# Patient Record
Sex: Female | Born: 1935 | Race: White | Hispanic: No | Smoking: Former smoker
Health system: Southern US, Community
[De-identification: ages and names within clinical notes are randomized; demographics above are authoritative.]

## PROBLEM LIST (undated history)

## (undated) DIAGNOSIS — E782 Mixed hyperlipidemia: Secondary | ICD-10-CM

## (undated) DIAGNOSIS — K429 Umbilical hernia without obstruction or gangrene: Secondary | ICD-10-CM

## (undated) DIAGNOSIS — I959 Hypotension, unspecified: Secondary | ICD-10-CM

## (undated) DIAGNOSIS — F0280 Dementia in other diseases classified elsewhere without behavioral disturbance: Secondary | ICD-10-CM

## (undated) DIAGNOSIS — Z7901 Long term (current) use of anticoagulants: Secondary | ICD-10-CM

## (undated) DIAGNOSIS — I2510 Atherosclerotic heart disease of native coronary artery without angina pectoris: Secondary | ICD-10-CM

## (undated) DIAGNOSIS — I5032 Chronic diastolic (congestive) heart failure: Secondary | ICD-10-CM

## (undated) DIAGNOSIS — G309 Alzheimer's disease, unspecified: Secondary | ICD-10-CM

## (undated) DIAGNOSIS — R569 Unspecified convulsions: Secondary | ICD-10-CM

## (undated) DIAGNOSIS — R58 Hemorrhage, not elsewhere classified: Secondary | ICD-10-CM

## (undated) DIAGNOSIS — F0390 Unspecified dementia without behavioral disturbance: Secondary | ICD-10-CM

## (undated) DIAGNOSIS — J449 Chronic obstructive pulmonary disease, unspecified: Secondary | ICD-10-CM

## (undated) DIAGNOSIS — I4891 Unspecified atrial fibrillation: Secondary | ICD-10-CM

## (undated) DIAGNOSIS — S32599A Other specified fracture of unspecified pubis, initial encounter for closed fracture: Secondary | ICD-10-CM

## (undated) DIAGNOSIS — M1990 Unspecified osteoarthritis, unspecified site: Secondary | ICD-10-CM

## (undated) DIAGNOSIS — Z789 Other specified health status: Secondary | ICD-10-CM

## (undated) HISTORY — DX: Unspecified dementia, unspecified severity, without behavioral disturbance, psychotic disturbance, mood disturbance, and anxiety: F03.90

## (undated) HISTORY — DX: Mixed hyperlipidemia: E78.2

## (undated) HISTORY — DX: Hypotension, unspecified: I95.9

## (undated) HISTORY — DX: Other specified fracture of unspecified pubis, initial encounter for closed fracture: S32.599A

## (undated) HISTORY — DX: Hemorrhage, not elsewhere classified: R58

## (undated) HISTORY — PX: OTHER SURGICAL HISTORY: SHX169

## (undated) HISTORY — PX: TONSILLECTOMY: SHX5217

## (undated) HISTORY — DX: Other specified health status: Z78.9

## (undated) HISTORY — PX: CATARACT EXTRACTION: SUR2

## (undated) HISTORY — DX: Chronic diastolic (congestive) heart failure: I50.32

## (undated) HISTORY — DX: Unspecified osteoarthritis, unspecified site: M19.90

## (undated) HISTORY — DX: Long term (current) use of anticoagulants: Z79.01

## (undated) HISTORY — DX: Chronic obstructive pulmonary disease, unspecified: J44.9

---

## 2000-11-03 ENCOUNTER — Ambulatory Visit (HOSPITAL_COMMUNITY): Admission: RE | Admit: 2000-11-03 | Discharge: 2000-11-03 | Payer: Self-pay | Admitting: Orthopaedic Surgery

## 2000-11-03 ENCOUNTER — Encounter: Payer: Self-pay | Admitting: Orthopaedic Surgery

## 2002-01-15 ENCOUNTER — Ambulatory Visit (HOSPITAL_COMMUNITY): Admission: RE | Admit: 2002-01-15 | Discharge: 2002-01-15 | Payer: Self-pay | Admitting: Ophthalmology

## 2002-04-15 ENCOUNTER — Inpatient Hospital Stay (HOSPITAL_COMMUNITY): Admission: EM | Admit: 2002-04-15 | Discharge: 2002-04-19 | Payer: Self-pay | Admitting: *Deleted

## 2002-04-15 ENCOUNTER — Encounter: Payer: Self-pay | Admitting: *Deleted

## 2003-04-01 ENCOUNTER — Ambulatory Visit (HOSPITAL_COMMUNITY): Admission: RE | Admit: 2003-04-01 | Discharge: 2003-04-01 | Payer: Self-pay | Admitting: Orthopaedic Surgery

## 2003-10-06 ENCOUNTER — Inpatient Hospital Stay (HOSPITAL_COMMUNITY): Admission: AD | Admit: 2003-10-06 | Discharge: 2003-10-08 | Payer: Self-pay | Admitting: Internal Medicine

## 2004-03-30 ENCOUNTER — Ambulatory Visit: Payer: Self-pay | Admitting: Cardiology

## 2004-11-24 ENCOUNTER — Ambulatory Visit: Payer: Self-pay | Admitting: Cardiology

## 2005-12-26 ENCOUNTER — Ambulatory Visit: Payer: Self-pay | Admitting: Cardiology

## 2006-06-12 ENCOUNTER — Ambulatory Visit: Payer: Self-pay | Admitting: Orthopedic Surgery

## 2006-08-01 ENCOUNTER — Ambulatory Visit: Payer: Self-pay | Admitting: Cardiology

## 2007-03-20 ENCOUNTER — Ambulatory Visit: Payer: Self-pay | Admitting: Cardiology

## 2007-03-27 ENCOUNTER — Encounter: Payer: Self-pay | Admitting: Physician Assistant

## 2007-03-27 ENCOUNTER — Ambulatory Visit: Payer: Self-pay | Admitting: Cardiology

## 2007-03-29 ENCOUNTER — Ambulatory Visit: Payer: Self-pay | Admitting: Cardiology

## 2007-09-03 ENCOUNTER — Ambulatory Visit: Payer: Self-pay | Admitting: Cardiology

## 2007-09-13 ENCOUNTER — Ambulatory Visit: Payer: Self-pay | Admitting: Cardiology

## 2007-09-28 ENCOUNTER — Ambulatory Visit: Payer: Self-pay | Admitting: Cardiology

## 2008-08-04 ENCOUNTER — Ambulatory Visit: Payer: Self-pay | Admitting: Cardiology

## 2008-12-01 ENCOUNTER — Encounter: Payer: Self-pay | Admitting: Cardiology

## 2009-02-10 DIAGNOSIS — I5033 Acute on chronic diastolic (congestive) heart failure: Secondary | ICD-10-CM | POA: Insufficient documentation

## 2009-02-10 DIAGNOSIS — I5032 Chronic diastolic (congestive) heart failure: Secondary | ICD-10-CM | POA: Insufficient documentation

## 2009-02-10 DIAGNOSIS — I2510 Atherosclerotic heart disease of native coronary artery without angina pectoris: Secondary | ICD-10-CM | POA: Insufficient documentation

## 2009-02-10 DIAGNOSIS — I4821 Permanent atrial fibrillation: Secondary | ICD-10-CM

## 2009-04-14 ENCOUNTER — Ambulatory Visit: Payer: Self-pay | Admitting: Cardiology

## 2009-04-15 ENCOUNTER — Telehealth: Payer: Self-pay | Admitting: Cardiology

## 2009-04-20 ENCOUNTER — Telehealth (INDEPENDENT_AMBULATORY_CARE_PROVIDER_SITE_OTHER): Payer: Self-pay | Admitting: *Deleted

## 2009-04-23 ENCOUNTER — Ambulatory Visit: Payer: Self-pay | Admitting: Cardiology

## 2009-04-23 ENCOUNTER — Encounter: Payer: Self-pay | Admitting: Physician Assistant

## 2009-04-24 ENCOUNTER — Ambulatory Visit: Payer: Self-pay | Admitting: Cardiology

## 2009-04-24 ENCOUNTER — Inpatient Hospital Stay (HOSPITAL_COMMUNITY): Admission: AD | Admit: 2009-04-24 | Discharge: 2009-04-28 | Payer: Self-pay | Admitting: Cardiology

## 2009-04-25 ENCOUNTER — Encounter: Payer: Self-pay | Admitting: Cardiology

## 2009-04-27 ENCOUNTER — Encounter: Payer: Self-pay | Admitting: Cardiology

## 2009-04-28 ENCOUNTER — Encounter: Payer: Self-pay | Admitting: Cardiology

## 2009-04-30 ENCOUNTER — Telehealth (INDEPENDENT_AMBULATORY_CARE_PROVIDER_SITE_OTHER): Payer: Self-pay | Admitting: *Deleted

## 2009-05-14 ENCOUNTER — Ambulatory Visit: Payer: Self-pay | Admitting: Physician Assistant

## 2009-05-14 DIAGNOSIS — E782 Mixed hyperlipidemia: Secondary | ICD-10-CM

## 2009-05-14 DIAGNOSIS — I2510 Atherosclerotic heart disease of native coronary artery without angina pectoris: Secondary | ICD-10-CM | POA: Insufficient documentation

## 2009-05-25 ENCOUNTER — Encounter (HOSPITAL_COMMUNITY): Admission: RE | Admit: 2009-05-25 | Discharge: 2009-06-24 | Payer: Self-pay | Admitting: Cardiology

## 2009-06-16 ENCOUNTER — Encounter: Payer: Self-pay | Admitting: Cardiology

## 2009-06-19 ENCOUNTER — Inpatient Hospital Stay (HOSPITAL_COMMUNITY): Admission: EM | Admit: 2009-06-19 | Discharge: 2009-06-22 | Payer: Self-pay | Admitting: Emergency Medicine

## 2009-06-19 ENCOUNTER — Telehealth (INDEPENDENT_AMBULATORY_CARE_PROVIDER_SITE_OTHER): Payer: Self-pay | Admitting: *Deleted

## 2009-06-19 ENCOUNTER — Ambulatory Visit: Payer: Self-pay | Admitting: Cardiology

## 2009-06-19 ENCOUNTER — Encounter (INDEPENDENT_AMBULATORY_CARE_PROVIDER_SITE_OTHER): Payer: Self-pay | Admitting: Physician Assistant

## 2009-06-20 ENCOUNTER — Encounter: Payer: Self-pay | Admitting: Cardiology

## 2009-06-22 ENCOUNTER — Encounter: Payer: Self-pay | Admitting: Cardiology

## 2009-06-25 ENCOUNTER — Encounter (HOSPITAL_COMMUNITY): Admission: RE | Admit: 2009-06-25 | Discharge: 2009-07-25 | Payer: Self-pay | Admitting: Cardiology

## 2009-07-08 ENCOUNTER — Telehealth (INDEPENDENT_AMBULATORY_CARE_PROVIDER_SITE_OTHER): Payer: Self-pay | Admitting: *Deleted

## 2009-07-20 ENCOUNTER — Encounter: Payer: Self-pay | Admitting: Cardiology

## 2009-07-27 ENCOUNTER — Encounter (HOSPITAL_COMMUNITY): Admission: RE | Admit: 2009-07-27 | Discharge: 2009-08-26 | Payer: Self-pay | Admitting: Cardiology

## 2009-07-30 ENCOUNTER — Ambulatory Visit: Payer: Self-pay | Admitting: Cardiology

## 2009-08-02 ENCOUNTER — Emergency Department (HOSPITAL_COMMUNITY)
Admission: EM | Admit: 2009-08-02 | Discharge: 2009-08-02 | Payer: Self-pay | Source: Home / Self Care | Admitting: Emergency Medicine

## 2009-08-04 ENCOUNTER — Encounter: Payer: Self-pay | Admitting: Cardiology

## 2009-08-12 ENCOUNTER — Encounter (INDEPENDENT_AMBULATORY_CARE_PROVIDER_SITE_OTHER): Payer: Self-pay | Admitting: *Deleted

## 2009-08-24 ENCOUNTER — Encounter: Payer: Self-pay | Admitting: Cardiology

## 2009-08-26 ENCOUNTER — Encounter (HOSPITAL_COMMUNITY): Admission: RE | Admit: 2009-08-26 | Discharge: 2009-09-25 | Payer: Self-pay | Admitting: Cardiology

## 2009-09-21 ENCOUNTER — Encounter: Payer: Self-pay | Admitting: Cardiology

## 2009-10-06 ENCOUNTER — Encounter (INDEPENDENT_AMBULATORY_CARE_PROVIDER_SITE_OTHER): Payer: Self-pay | Admitting: *Deleted

## 2009-12-28 ENCOUNTER — Encounter: Payer: Self-pay | Admitting: Cardiology

## 2009-12-30 ENCOUNTER — Telehealth (INDEPENDENT_AMBULATORY_CARE_PROVIDER_SITE_OTHER): Payer: Self-pay | Admitting: *Deleted

## 2010-01-18 ENCOUNTER — Encounter: Payer: Self-pay | Admitting: Physician Assistant

## 2010-01-18 ENCOUNTER — Encounter: Payer: Self-pay | Admitting: Cardiology

## 2010-02-04 ENCOUNTER — Encounter: Payer: Self-pay | Admitting: Cardiology

## 2010-02-09 ENCOUNTER — Encounter: Payer: Self-pay | Admitting: Cardiology

## 2010-02-16 ENCOUNTER — Telehealth (INDEPENDENT_AMBULATORY_CARE_PROVIDER_SITE_OTHER): Payer: Self-pay | Admitting: *Deleted

## 2010-02-22 ENCOUNTER — Ambulatory Visit: Payer: Self-pay | Admitting: Cardiology

## 2010-03-22 ENCOUNTER — Encounter (INDEPENDENT_AMBULATORY_CARE_PROVIDER_SITE_OTHER): Payer: Self-pay

## 2010-06-24 NOTE — Miscellaneous (Signed)
Summary: Rehab Report/ CARDIAC REHAB REPORT [place]  Rehab Report/ CARDIAC REHAB REPORT [place]   Imported By: Dorise Hiss 09/14/2009 09:43:06  _____________________________________________________________________  External Attachment:    Type:   Image     Comment:   External Document

## 2010-06-24 NOTE — Progress Notes (Signed)
Summary: chest pain      Phone Note Call from Patient   Summary of Call: Pt. had stents placed around 1st week of December.  Had post hosp. visit on 12/29 with Gene - doing okay during that time.  New symptoms started 2 weeks ago.  Feeling very tired, difficulty going up steps.  Reaching exercises hard to do.  SOB which is worse with exertion.  Seems to be feeling stumbly on her feet, esp. after exercise.  No dizziness.  Did wake up in middle of night with pain/pressure/tightness in chest.  States she used inhaler and 2nd pillow and symptoms went away gradually.  Did wake up again this morning (around 6) with same type of pain/pressure and took NTG x1 which relieved pain immediately.  Now just feels weak in the knees, but no pain or SOB.  Can definitely tell that something is different.    07-01-2009 [facility] RETD YOUR TELEPHONE CALL 9:50AM V.SLAUGHTER Initial call taken by: Hoover Brunette, LPN,  June 19, 2009 9:14 AM  Follow-up for Phone Call        Per Dr. Andee Lineman, start Imdur 30mg  daily.  Can increase to 60mg  tomorrow.  If have pain over the weekend, need to go to ER.  If no pain, then need to be seen Monday or Tuesday for follow up in office.   Hoover Brunette, LPN  June 19, 2009 3:29 PM   Left message to return call.  Follow-up by: Hoover Brunette, LPN,  June 19, 2009 3:29 PM  Additional Follow-up for Phone Call Additional follow up Details #1::        Tried to reach pt. again.  Left above info on answering machine.  Wil await call from pt. on Monday a.m.  Hoover Brunette, LPN  June 19, 2009 4:54 PM     Additional Follow-up for Phone Call Additional follow up Details #2::    Follow up call to pt.  Did not start Imdur because she went to ER at [facility] that same day.  States her daughter is a Engineer, civil (consulting) also and just didn't feel like she should wait.  Hospital course dictation states pt. was ruled out for MI.  Did have Lexiscan Myoview which showed no evidence of ischemia with EF of 69%.  No  recurrent chest pain.  Did advise her to keep regular f/u for you which is planned for March and routine f/u with Dr. Sherril Croon.   She also said that they said  something about  a virus going around that could affect your heart.  Advised her that  she may not need to start her Imdur, but would confirm with you.  Do you want any earlier f/u than March? Follow-up by: Hoover Brunette, LPN,  June 25, 2009 3:23 PM  Additional Follow-up for Phone Call Additional follow up Details #3:: Details for Additional Follow-up Action Taken: would still start low-dose Imdur and can work in patient earlier in March. Lewayne Bunting, MD, [facility]  June 26, 2009 5:46 AM  Patient notified.   Will start with 30mg  Imdur daily till seen on 3/10.  Pt. will call if any problems before then.    Additional Follow-up by: Hoover Brunette, LPN,  July 01, 2009 2:15 PM  New/Updated Medications: IMDUR 60 MG XR24H-TAB (ISOSORBIDE MONONITRATE) take 1/2 tab (30mg ) today, then 1 tab daily Prescriptions: IMDUR 60 MG XR24H-TAB (ISOSORBIDE MONONITRATE) take 1/2 tab (30mg ) today, then 1 tab daily  #30 x 6   Entered  by:   Hoover Brunette, LPN   Authorized by:   Lewayne Bunting, MD, [facility]   Signed by:   Hoover Brunette, LPN on 16/02/9603   Method used:   Electronically to        [facility]* (retail)       [address] [place], [place]  [address]       Ph: [phone]       Fax: [phone]   RxID:   [id number]

## 2010-06-24 NOTE — Assessment & Plan Note (Signed)
Summary: 6 mo fu per sept reminder-srs   Visit Type:  Follow-up Primary Provider:  Doreen Beam, MD   History of Present Illness: the patient is a 77 year old female with a history of permanent atrial fibrillation, nonobstructive coronary artery disease status post stent placement with a bare-metal stent to the circumflex. She has recently been switched her Coumadin to dabigatran.she called recently because of an episode of substernal chest pain. She experienced chest pressure which required 3 nitroglycerin for relief. Subsequently isosorbide mononitrate was called in but this was never filled. Interestingly however the patient reports no exertional chest pain or shortness of breath. Since this particular episode check has done quite well. Lipid testing was done in March and revealed an LDL of 47 mg percent. Her EKG today demonstrates a fibrillation with nonspecific ST segment changes. The patient denies any palpitations orthopnea or PND.  Preventive Screening-Counseling & Management  Alcohol-Tobacco     Smoking Status: quit     Year Quit: 1990  Current Medications (verified): 1)  Digoxin 0.125 Mg Tabs (Digoxin) .... Take 1 Tablet By Mouth Once A Day 2)  Diltiazem Hcl Coated Beads 120 Mg Xr24h-Cap (Diltiazem Hcl Coated Beads) .... Take 1 Capsule By Mouth Once A Day 3)  Ergocalciferol 50000 Unit Caps (Ergocalciferol) .... Weekly 4)  Caltrate 600+d 600-400 Mg-Unit Tabs (Calcium Carbonate-Vitamin D) .... Take 1 Tablet By Mouth Two Times A Day 5)  Eye-Vites  Tabs (Multiple Vitamins-Minerals) .... Take 1 Tablet By Mouth Once A Day 6)  Furosemide 40 Mg Tabs (Furosemide) .... Take 1/2 Tablet By Mouth Once A Day 7)  Keppra 250 Mg Tabs (Levetiracetam) .... Take 1/2 Tablet By Mouth At 3pm and One At Bedtime 8)  Hydrocodone-Acetaminophen 5-500 Mg Tabs (Hydrocodone-Acetaminophen) .... As Needed Pain 9)  Potassium Chloride Cr 10 Meq Cr-Caps (Potassium Chloride) .... Take One Tablet By Mouth Daily 10)   Aspir-Low 81 Mg Tbec (Aspirin) .... Take 1 Tablet By Mouth Once A Day 11)  Pradaxa 150 Mg Caps (Dabigatran Etexilate Mesylate) .... Take 1 Tablet By Mouth Two Times A Day 12)  Pravastatin Sodium 40 Mg Tabs (Pravastatin Sodium) .... Take 1 Tab By Mouth At Bedtime 13)  Ventolin Hfa 108 (90 Base) Mcg/act Aers (Albuterol Sulfate) .... Two Puffs Four Times A Day As Needed 14)  Imdur 30 Mg Xr24h-Tab (Isosorbide Mononitrate) .... Take 1 Tablet By Mouth Once A Day  Allergies: 1)  Tegretol  Comments:  Nurse/Medical Assistant: The patient's medication bottles and allergies were reviewed with the patient and were updated in the Medication and Allergy Lists.  Past History:  Past Medical History: LEFT VENTRICULAR FUNCTION, DECREASED (ICD-429.2)ejection fraction normalized by Lexiscan 2010:69% DIASTOLIC HEART FAILURE, CHRONIC (ICD-428.32) ATRIAL FIBRILLATION (ICD-427.31) COPD 1. Permanent atrial fibrillation, rate controlled. 2. Status post failed radiofrequency catheter ablation. 3. History of intolerance to FLECAINIDE and AMIODARONE. 4. Chronic diastolic heart failure, compensated. 5. Mild left ventricular systolic dysfunction. 6. Mild chronic obstructive pulmonary disease. 7. Coumadin secondary to atrial fibrillation. /changed to dabigatran  Review of Systems       The patient complains of chest pain.  The patient denies fatigue, malaise, fever, weight gain/loss, vision loss, decreased hearing, hoarseness, palpitations, shortness of breath, prolonged cough, wheezing, sleep apnea, coughing up blood, abdominal pain, blood in stool, nausea, vomiting, diarrhea, heartburn, incontinence, blood in urine, muscle weakness, joint pain, leg swelling, rash, skin lesions, headache, fainting, dizziness, depression, anxiety, enlarged lymph nodes, easy bruising or bleeding, and environmental allergies.    Vital Signs:  Patient profile:  77 year old female Height:      63 inches Weight:      144  pounds BMI:     25.60 Pulse rate:   108 / minute BP sitting:   109 / 73  (left arm) Cuff size:   regular  Vitals Entered By: Carlye Grippe (February 22, 2010 1:31 PM)  Nutrition Counseling: Patient's BMI is greater than 25 and therefore counseled on weight management options.  Physical Exam  Additional Exam:  GENERAL: 77 year old female, sitting upright, in no distress. HEENT: [place], AT. PERRLA, EOMI. NECK: Palpable carotid pulses, no bruits; no JVD, thyromegaly. LUNGS: CTA bilaterally. CARDIAC: irregularly irregular. No significant murmurs. No rubs, gallops. ABDOMEN: Soft, nontender. Intact BS. EXTREMETIES: Palpable femoral pulses, no bruits, no swelling or ecchymosis; minimally palpable posterior tibialis pulses, with chronic bilateral 2+ edema. MUSCULOSKELETAL: No obvious deformity. SKIN: warm, dry. no obvious rashes. NEURO: Alert and oriented. No focal deficit.    EKG  Procedure date:  02/22/2010  Findings:      atrial fibrillation. Nonspecific ST-T wave changes. Heart rate 82 beats per minute  Impression & Recommendations:  Problem # 1:  CAD (ICD-414.00) status post bare-metal stenting to the circumflex coronary artery. Recurrent substernal chest pain that resolved with sublingual nitroglycerin. Atypical features because of lack of exercise induced chest pain. Patient never received a sore but mononitrate which we will fill. If she has recurrent pain repeat cardiac catheterization may be indicated. Her updated medication list for this problem includes:    Diltiazem Hcl Coated Beads 120 Mg Xr24h-cap (Diltiazem hcl coated beads) .[place] Kitchen... Take 1 capsule by mouth once a day    Aspir-low 81 Mg Tbec (Aspirin) .Marland Kitchen... Take 1 tablet by mouth once a day    Imdur 30 Mg Xr24h-tab (Isosorbide mononitrate) .Marland Kitchen... Take 1 tablet by mouth once a day  Problem # 2:  LEFT VENTRICULAR FUNCTION, DECREASED (ICD-429.2) most recent ejection fraction 69% by Lexiscan with resolution of LV  dysfunction.  Problem # 3:  ATRIAL FIBRILLATION (ICD-427.31) rate controlled. Continue dabigatran Her updated medication list for this problem includes:    Digoxin 0.125 Mg Tabs (Digoxin) .Marland Kitchen... Take 1 tablet by mouth once a day    Aspir-low 81 Mg Tbec (Aspirin) .Marland Kitchen... Take 1 tablet by mouth once a day  Orders: EKG w/ Interpretation (93000)  Problem # 4:  DYSLIPIDEMIA (ICD-272.4) continue current statin dose. Her updated medication list for this problem includes:    Pravastatin Sodium 40 Mg Tabs (Pravastatin sodium) .Marland Kitchen... Take 1 tab by mouth at bedtime  Patient Instructions: 1)  If patient calls back with severe chest pain, will need to schedule Lexiscan stress test.   2)  Follow up in  6 months

## 2010-06-24 NOTE — Medication Information (Signed)
Summary: RX Folder/ [facility]  RX Folder/ [facility]   Imported By: Dorise Hiss 02/04/2010 14:38:48  _____________________________________________________________________  External Attachment:    Type:   Image     Comment:   External Document  Appended Document: RX Folder/ [facility] should only be on diltiazem.

## 2010-06-24 NOTE — Assessment & Plan Note (Signed)
Summary: 3 MO FU ALSO STATED WAS IN [place]   Visit Type:  Follow-up Primary Provider:  Doreen Beam, MD  CC:  follow-up visit.  History of Present Illness: the patient is an 77 year old female with a history of permanent atrial fibrillationA. nonobstructive coronary artery disease. Last year the patient was transferred to [facility] for an episode of unstable angina. She was found to have significant circumflex lesion and was treated with bare-metal stenting by Dr. Riley Kill. The patient is currently on Coumadin. She remains in permanent atrial fibrillation but is asymptomatic. She states that after amlodipine was started she has had no recurrent chest pain has significantly less shortness of breath. Her energy level has much improved. She denies any palpitations. She reports that Imdur cost severe headaches and was discontinued. Her previous reported neck pain also has improved.   Preventive Screening-Counseling & Management  Alcohol-Tobacco     Smoking Status: quit     Year Quit: 1990  Current Medications (verified): 1)  Digoxin 0.125 Mg Tabs (Digoxin) .... Take 1 Tablet By Mouth Once A Day 2)  Diltiazem Hcl Coated Beads 120 Mg Xr24h-Cap (Diltiazem Hcl Coated Beads) .... Take 1 Capsule By Mouth Once A Day 3)  Ergocalciferol 50000 Unit Caps (Ergocalciferol) .... Weekly 4)  Caltrate 600+d 600-400 Mg-Unit Tabs (Calcium Carbonate-Vitamin D) .... Take 1 Tablet By Mouth Two Times A Day 5)  Eye-Vites  Tabs (Multiple Vitamins-Minerals) .... Take 1 Tablet By Mouth Once A Day 6)  Furosemide 40 Mg Tabs (Furosemide) .... Take 1/2 Tablet By Mouth Once A Day 7)  Keppra 250 Mg Tabs (Levetiracetam) .... Take 1/2 Tablet By Mouth At 3pm and One At Bedtime 8)  Hydrocodone-Acetaminophen 5-500 Mg Tabs (Hydrocodone-Acetaminophen) .... As Needed Pain 9)  Potassium Chloride Cr 10 Meq Cr-Caps (Potassium Chloride) .... Take One Tablet By Mouth Daily 10)  Aspir-Low 81 Mg Tbec (Aspirin) .... Take 1 Tablet By  Mouth Once A Day 11)  Coumadin 5 Mg Tabs (Warfarin Sodium) .... Use As Directed 12)  Simvastatin 40 Mg Tabs (Simvastatin) .... Take 1 Tablet By Mouth Once A Day 13)  Amlodipine Besylate 5 Mg Tabs (Amlodipine Besylate) .... Take 1 Tablet By Mouth Once A Day  (Please Deliver) 14)  Ventolin Hfa 108 (90 Base) Mcg/act Aers (Albuterol Sulfate) .... Two Puffs Four Times A Day As Needed  Allergies: 1)  Tegretol  Comments:  Nurse/Medical Assistant: The patient's medications and allergies were reviewed with the patient and were updated in the Medication and Allergy Lists. Bottles reviewed.  Past History:  Past Surgical History: Last updated: 02/10/2009 Right carpal tunnel release Cataract Extraction Tonsillectomy  Family History: Last updated: 02/10/2009 Family History of Coronary Artery Disease:   Social History: Last updated: 02/10/2009 Widowed  Tobacco Use - Former.  Alcohol Use - no Drug Use - no  Risk Factors: Smoking Status: quit (07/30/2009)  Past Medical History: Reviewed history from 04/14/2009 and no changes required. LEFT VENTRICULAR FUNCTION, DECREASED (ICD-429.2) DIASTOLIC HEART FAILURE, CHRONIC (ICD-428.32) ATRIAL FIBRILLATION (ICD-427.31) COPD 1. Permanent atrial fibrillation, rate controlled. 2. Status post failed radiofrequency catheter ablation. 3. History of intolerance to FLECAINIDE and AMIODARONE. 4. Chronic diastolic heart failure, compensated. 5. Mild left ventricular systolic dysfunction. 6. Mild chronic obstructive pulmonary disease. 7. Coumadin secondary to atrial fibrillation.   Social History: Smoking Status:  quit  Review of Systems  The patient denies fatigue, malaise, fever, weight gain/loss, vision loss, decreased hearing, hoarseness, chest pain, palpitations, shortness of breath, prolonged cough, wheezing, sleep apnea, coughing  up blood, abdominal pain, blood in stool, nausea, vomiting, diarrhea, heartburn, incontinence, blood in urine,  muscle weakness, joint pain, leg swelling, rash, skin lesions, headache, fainting, dizziness, depression, anxiety, enlarged lymph nodes, easy bruising or bleeding, and environmental allergies.    Vital Signs:  Patient profile:   77 year old female Height:      63 inches Weight:      143 pounds Pulse rate:   72 / minute Resp:     16 per minute BP sitting:   124 / 88  (left arm) Cuff size:   regular  Vitals Entered By: Carlye Grippe (July 30, 2009 9:42 AM) CC: follow-up visit   Physical Exam  Additional Exam:  GENERAL: 77 year old female, sitting upright, in no distress. HEENT: [place], AT. PERRLA, EOMI. NECK: Palpable carotid pulses, no bruits; no JVD, thyromegaly. LUNGS: CTA bilaterally. CARDIAC: irregularly irregular. No significant murmurs. No rubs, gallops. ABDOMEN: Soft, nontender. Intact BS. EXTREMETIES: Palpable femoral pulses, no bruits, no swelling or ecchymosis; minimally palpable posterior tibialis pulses, with chronic bilateral 2+ edema. MUSCULOSKELETAL: No obvious deformity. SKIN: warm, dry. no obvious rashes. NEURO: Alert and oriented. No focal deficit.    Impression & Recommendations:  Problem # 1:  ATRIAL FIBRILLATION (ICD-427.31) the patient is in permanent atrial fibrillation. Her heart rate is well controlled. No further medication adjustments were made. The following medications were removed from the medication list:    Plavix 75 Mg Tabs (Clopidogrel bisulfate) .Marland Kitchen... Take one tablet by mouth daily Her updated medication list for this problem includes:    Digoxin 0.125 Mg Tabs (Digoxin) .Marland Kitchen... Take 1 tablet by mouth once a day    Aspir-low 81 Mg Tbec (Aspirin) .Marland Kitchen... Take 1 tablet by mouth once a day    Coumadin 5 Mg Tabs (Warfarin sodium) ..... Use as directed  Problem # 2:  CAD (ICD-414.00) the patient was recently hospitalized with recurrent chest pain. A Cardiolite stress study was done in hospital. There was no evidence of ischemia. The patient was treated  medically. I started the patient on amlodipine with significant improvement. We have scheduled a lipid panel LFTs to monitor secondary prevention. The following medications were removed from the medication list:    Plavix 75 Mg Tabs (Clopidogrel bisulfate) .Marland Kitchen... Take one tablet by mouth daily Her updated medication list for this problem includes:    Diltiazem Hcl Coated Beads 120 Mg Xr24h-cap (Diltiazem hcl coated beads) .Marland Kitchen... Take 1 capsule by mouth once a day    Aspir-low 81 Mg Tbec (Aspirin) .Marland Kitchen... Take 1 tablet by mouth once a day    Coumadin 5 Mg Tabs (Warfarin sodium) ..... Use as directed    Amlodipine Besylate 5 Mg Tabs (Amlodipine besylate) .Marland Kitchen... Take 1 tablet by mouth once a day  (please deliver)  Problem # 3:  DYSLIPIDEMIA (ICD-272.4) Assessment: Comment Only  Her updated medication list for this problem includes:    Simvastatin 40 Mg Tabs (Simvastatin) .Marland Kitchen... Take 1 tablet by mouth once a day  Orders: T-Lipid Profile ([id number]) T-Hepatic Function [phone])  Problem # 4:  LEFT VENTRICULAR FUNCTION, DECREASED (ICD-429.2) ejection fraction normalized. Most recent ejection fraction 69% by LexiScan Myoview  Patient Instructions: 1)  Labs:  Lipids & Liver Function 2)  Reminder:  Nothing to eat or drink after 12 midnight prior to labs.  3)  Follow up in  6 months

## 2010-06-24 NOTE — Medication Information (Signed)
Summary: RX Folder/ [facility] MEDICATION HISTORY  RX Folder/ [facility] MEDICATION HISTORY   Imported By: Dorise Hiss 01/18/2010 11:56:29  _____________________________________________________________________  External Attachment:    Type:   Image     Comment:   External Document

## 2010-06-24 NOTE — Letter (Signed)
Summary: Letter/ TO DEGENT FROM Jordane Garis  Letter/ TO [facility]GENT FROM Aasia Bocock   Imported By: Dorise Hiss 12/30/2009 16:04:11  _____________________________________________________________________  External Attachment:    Type:   Image     Comment:   External Document

## 2010-06-24 NOTE — Letter (Signed)
Summary: Recall, Screening Colonoscopy Only  [facility]  [address]   [place], [place] [address]   Phone: [phone]  Fax: [phone]    March 22, 2010  Deborah [name] [address] [place], [place]  [address] Aug 04, 1925   Dear [name],   Our records indicate it is time to schedule your colonoscopy.    Please call our office at [phone] and ask for the nurse.   Thank you,  Hendricks Limes, LPN Cloria Spring, LPN  [facility] Ph: [phone]   Fax: [phone]

## 2010-06-24 NOTE — Letter (Signed)
Summary: Engineer, materials at [facility]  [address]   [place], [place] [address]   Phone: [phone]  Fax: [phone]        August 12, 2009 MRN: [id number]   [facility] [address] [place], [place]  [address]   Dear Ms. Velie,  Your test ordered by Selena Batten has been reviewed by your physician (or physician assistant) and was found to be normal or stable. Your physician (or physician assistant) felt no changes were needed at this time.  ____ Echocardiogram  ____ Cardiac Stress Test  __X__ Lab Work - lipid panel at goal  ____ Peripheral vascular study of arms, legs or neck  ____ CT scan or X-ray  ____ Lung or Breathing test  ____ Other:   Thank you.   Hoover Brunette, LPN    Duane Boston, M.D., F.A.C.C. Thressa Sheller, M.D., F.A.C.C. Oneal Grout, M.D., F.A.C.C. Cheree Ditto, M.D., F.A.C.C. Daiva Nakayama, M.D., F.A.C.C. Kenney Houseman, M.D., F.A.C.C. Jeanne Ivan, PA-C

## 2010-06-24 NOTE — Letter (Signed)
Summary: External Correspondence/ FAXED [place]  External Correspondence/ FAXED [place]   Imported By: Dorise Hiss 06/16/2009 16:38:44  _____________________________________________________________________  External Attachment:    Type:   Image     Comment:   External Document

## 2010-06-24 NOTE — Progress Notes (Signed)
Summary: ?? med change      Phone Note Other Incoming   Summary of Call: Patient brought letter in for Dr. Andee Lineman.  States that Dr. Sherril Croon has been managing her coumadin, but he is interested in changinng it to Pradaxa.  She did not want to do anything until checking with you first.  See scanned in letter from patient. Left message to return call.  Hoover Brunette, LPN  December 30, 2009 10:24 AM   Follow-up for Phone Call        Patient notified that I will send note to GD.  MD is currently out of office till the end of the week so may not have a reply till next week.  Patient verbalized understanding.  Follow-up by: Hoover Brunette, LPN,  December 30, 2009 4:18 PM  Additional Follow-up for Phone Call Additional follow up Details #1::        it would be fine to change the patient to dabigatran 150 mg p.o. b.i.d.she needs an electrolyte panel before starting and also INR needs to be below two after Coumadin has been stopped. Additional Follow-up by: Lewayne Bunting, MD, [facility],  January 03, 2010 5:00 PM    Additional Follow-up for Phone Call Additional follow up Details #2::    Busy. Hoover Brunette, LPN  January 04, 2010 10:14 AM   Busy. Hoover Brunette, LPN  January 04, 2010 10:45 AM    Patient notified.   Hoover Brunette, LPN  January 07, 2010 9:23 AM

## 2010-06-24 NOTE — Progress Notes (Signed)
Summary: chest pain  Phone Note Call from Patient   Summary of Call: States she had chest pain last night that woke her up several times.  Rated 5/10.  Was able to go back to sleep easily.  When she got up this morning, she remembered that she had NTG, so she took 3 before she had total relief.  Stated that she feels perfectly fine now, just a little headache.  Informed her that in the future she may want to call 911 if she has had to take 2 doses of NTG with no relief, don't wait till she does the 3rd dose.  Patient verbalized understanding.  Discussed above with Dr. Andee Lineman - he recommends starting Imdur 30mg  once daily and f/u next couple weeks.  Already has scheduled OV for Monday, October 3 with GD.  Will send rx to [facility] (deliver) at pt request.    Initial call taken by: Hoover Brunette, LPN,  February 16, 2010 11:19 AM    New/Updated Medications: IMDUR 30 MG XR24H-TAB (ISOSORBIDE MONONITRATE) Take 1 tablet by mouth once a day Prescriptions: IMDUR 30 MG XR24H-TAB (ISOSORBIDE MONONITRATE) Take 1 tablet by mouth once a day  #30 x 6   Entered by:   Hoover Brunette, LPN   Authorized by:   Lewayne Bunting, MD, [facility]   Signed by:   Hoover Brunette, LPN on 40/98/1191   Method used:   Electronically to        [facility] (retail)       [address] [place], [place]  [address]       Ph: [phone]       Fax: [phone]   RxID:   [id number]

## 2010-06-24 NOTE — Miscellaneous (Signed)
Summary: diltiazem refill  Clinical Lists Changes  Medications: Rx of DILTIAZEM HCL COATED BEADS 120 MG XR24H-CAP (DILTIAZEM HCL COATED BEADS) Take 1 capsule by mouth once a day;  #30 x 6;  Signed;  Entered by: Teressa Lower RN;  Authorized by: Lewayne Bunting, MD, [facility];  Method used: Electronically to [facility]*, [address], [place], [place], [place]  [address], Ph: [phone], Fax: [phone]    Prescriptions: DILTIAZEM HCL COATED BEADS 120 MG XR24H-CAP (DILTIAZEM HCL COATED BEADS) Take 1 capsule by mouth once a day  #30 x 6   Entered by:   Teressa Lower RN   Authorized by:   Lewayne Bunting, MD, [facility]   Signed by:   Teressa Lower RN on 10/06/2009   Method used:   Electronically to        [facility]* (retail)       [address]       [place], [place]  [address]       Ph: [phone]       Fax: [phone]   RxID:   [id number]

## 2010-06-24 NOTE — Progress Notes (Signed)
Summary: headache   Phone Note Call from Patient   Summary of Call: Started Isosorbide 60mg  tablet - 1/2 tab daily on 2/11.  Since then, having really bad headaches.  Did wake up in middle of night with head ache. No other symptoms.  Please advise. Hoover Brunette, LPN  July 08, 2009 5:07 PM   Follow-up for Phone Call        Per Dr. Andee Lineman , stop Imdur.  Start Amlodipine 5mg  daily.   Patient notified.   Rx sent to pharm.   Follow-up by: Hoover Brunette, LPN,  July 10, 2009 4:29 PM    New/Updated Medications: AMLODIPINE BESYLATE 5 MG TABS (AMLODIPINE BESYLATE) Take 1 tablet by mouth once a day  (please deliver) Prescriptions: AMLODIPINE BESYLATE 5 MG TABS (AMLODIPINE BESYLATE) Take 1 tablet by mouth once a day  (please deliver)  #30 x 6   Entered by:   Hoover Brunette, LPN   Authorized by:   Lewayne Bunting, MD, [facility]   Signed by:   Hoover Brunette, LPN on 29/56/2130   Method used:   Electronically to        [facility]* (retail)       [address] [place], [place]  [address]       Ph: [phone]       Fax: [phone]   RxID:   [id number]

## 2010-06-24 NOTE — Miscellaneous (Signed)
Summary: rx update  Clinical Lists Changes  Medications: Removed medication of AMLODIPINE BESYLATE 5 MG TABS (AMLODIPINE BESYLATE) Take 1 tablet by mouth once a day  (please deliver) Changed medication from SIMVASTATIN 40 MG TABS (SIMVASTATIN) Take 1 tablet by mouth once a day to PRAVASTATIN SODIUM 40 MG TABS (PRAVASTATIN SODIUM) Take 1 tab by mouth at bedtime

## 2010-06-24 NOTE — Letter (Signed)
Summary: [facility]/FAXED TO DR. [id number]  [facility]/FAXED TO DR. [id number]   Imported By: Arta Bruce 01/18/2010 10:49:19  _____________________________________________________________________  External Attachment:    Type:   Image     Comment:   External Document

## 2010-06-24 NOTE — Miscellaneous (Signed)
Summary: Rehab Report/ CARDIAC REHAB REPORT [place]  Rehab Report/ CARDIAC REHAB REPORT [place]   Imported By: Dorise Hiss 07/28/2009 16:02:37  _____________________________________________________________________  External Attachment:    Type:   Image     Comment:   External Document

## 2010-06-24 NOTE — Miscellaneous (Signed)
Summary: Rehab Report/ FINAL OUTCOME REPORT SUMMARY  Rehab Report/ FINAL OUTCOME REPORT SUMMARY   Imported By: Dorise Hiss 10/06/2009 09:48:24  _____________________________________________________________________  External Attachment:    Type:   Image     Comment:   External Document

## 2010-08-08 LAB — BASIC METABOLIC PANEL
BUN: 18 mg/dL (ref 6–23)
Calcium: 8.9 mg/dL (ref 8.4–10.5)
Calcium: 9.5 mg/dL (ref 8.4–10.5)
Creatinine, Ser: 0.75 mg/dL (ref 0.4–1.2)
GFR calc Af Amer: >60 mL/min (ref >60)
GFR calc non Af Amer: >60 mL/min (ref >60)
Glucose, Bld: 94 mg/dL (ref 70–99)
Glucose, Bld: 97 mg/dL (ref 70–99)
Potassium: 3.7 mEq/L (ref 3.5–5.1)
Sodium: 140 mEq/L (ref 135–145)

## 2010-08-08 LAB — DIFFERENTIAL
Basophils Relative: 0 % (ref 0–1)
Eosinophils Absolute: 0.1 10*3/uL (ref 0.0–0.7)
Eosinophils Relative: 1 % (ref 0–5)
Monocytes Absolute: 0.6 10*3/uL (ref 0.1–1.0)

## 2010-08-08 LAB — URINE MICROSCOPIC-ADD ON

## 2010-08-08 LAB — CBC
HCT: 43.5 % (ref 36.0–46.0)
Hemoglobin: 14.4 g/dL (ref 12.0–15.0)
MCHC: 33.1 g/dL (ref 30.0–36.0)
MCV: 91.4 fL (ref 78.0–100.0)
Platelets: 153 10*3/uL (ref 150–400)
RDW: 13.2 % (ref 11.5–15.5)

## 2010-08-08 LAB — URINALYSIS, ROUTINE W REFLEX MICROSCOPIC
Nitrite: NEGATIVE
Protein, ur: NEGATIVE mg/dL
Specific Gravity, Urine: 1.012 (ref 1.005–1.030)
Urobilinogen, UA: 0.2 mg/dL (ref 0.0–1.0)

## 2010-08-08 LAB — POCT CARDIAC MARKERS
CKMB, poc: 1.5 ng/mL (ref 1.0–8.0)
Myoglobin, poc: 59.6 ng/mL (ref 12–200)
Troponin i, poc: <0.05 ng/mL (ref 0.00–0.09)

## 2010-08-08 LAB — TROPONIN I: Troponin I: 0.01 ng/mL (ref 0.00–0.06)

## 2010-08-08 LAB — PROTIME-INR: Prothrombin Time: 24 seconds — ABNORMAL HIGH (ref 11.6–15.2)

## 2010-08-08 LAB — CK TOTAL AND CKMB (NOT AT ARMC)
CK, MB: 1.6 ng/mL (ref 0.3–4.0)
Total CK: 47 U/L (ref 7–177)

## 2010-08-24 LAB — BASIC METABOLIC PANEL
BUN: 12 mg/dL (ref 6–23)
BUN: 18 mg/dL (ref 6–23)
CO2: 26 mEq/L (ref 19–32)
Chloride: 110 mEq/L (ref 96–112)
GFR calc Af Amer: >60 mL/min (ref >60)
GFR calc non Af Amer: >60 mL/min (ref >60)
GFR calc non Af Amer: >60 mL/min (ref >60)
Glucose, Bld: 99 mg/dL (ref 70–99)
Potassium: 3.4 mEq/L — ABNORMAL LOW (ref 3.5–5.1)
Potassium: 3.9 mEq/L (ref 3.5–5.1)
Sodium: 137 mEq/L (ref 135–145)
Sodium: 141 mEq/L (ref 135–145)
Sodium: 142 mEq/L (ref 135–145)

## 2010-08-24 LAB — CBC
HCT: 40.5 % (ref 36.0–46.0)
Hemoglobin: 13.5 g/dL (ref 12.0–15.0)
Hemoglobin: 14.1 g/dL (ref 12.0–15.0)
MCV: 91.1 fL (ref 78.0–100.0)
Platelets: 166 10*3/uL (ref 150–400)
Platelets: 170 10*3/uL (ref 150–400)
RDW: 13.7 % (ref 11.5–15.5)
WBC: 5 10*3/uL (ref 4.0–10.5)
WBC: 5.4 10*3/uL (ref 4.0–10.5)
WBC: 6 10*3/uL (ref 4.0–10.5)

## 2010-08-24 LAB — LIPID PANEL
Cholesterol: 193 mg/dL (ref 0–200)
Total CHOL/HDL Ratio: 3.2 RATIO
Triglycerides: 135 mg/dL (ref <150)
VLDL: 27 mg/dL (ref 0–40)

## 2010-09-01 ENCOUNTER — Other Ambulatory Visit: Payer: Self-pay | Admitting: Cardiology

## 2010-09-16 ENCOUNTER — Other Ambulatory Visit: Payer: Self-pay | Admitting: Cardiology

## 2010-10-05 NOTE — Assessment & Plan Note (Signed)
[facility]                          [facility] NOTE   NAME:Deborah [name]                        MRN:          [id number]  DATE:08/04/2008                            DOB:          1925-08-07    HISTORY OF PRESENT ILLNESS:  The patient is an 77 year old female with  history of permanent atrial fibrillation, status post failed  radiofrequency catheter ablation, intolerance to FLECAINIDE and  AMIODARONE.  The patient is at risk for thromboembolic disease and has  remained on Coumadin.  She is doing remarkably well.  She has no  shortness of breath, orthopnea, or PND.  She has no palpitations or  syncope.  Her last echocardiographic study showed an ejection fraction  40-45%.  She has had no recent heart failure symptoms.   MEDICATIONS:  1. Keppra 250 mg one and half tablets p.o. daily.  2. Vitamin D 50,000 units q. week.  3. Digoxin 0.125 mg daily.  4. Diltiazem 120 mg p.o. daily.  5. Lasix 40 was one-half tablet p.o. daily.  6. Coumadin 5 mg as directed.  7. Darvocet daily.  8. I-caps.  9. Caltrate.  10.Avastin.  11.ProAir inhaler b.i.d.   PHYSICAL EXAMINATION:  VITAL SIGNS:  Blood pressure 120/78, heart rate  75, and weight 143 pounds.  NECK:  Normal carotid upstroke.  No carotid bruits.  LUNGS:  Clear breath sounds bilaterally, somewhat diminished at the base  with few crackles.  HEART:  Irregular rate and rhythm.  Normal S1 and S2.  No pathological  murmurs.  ABDOMEN:  Soft and nontender.  No rebound or guarding.  Good bowel  sounds.  EXTREMITIES:  No cyanosis, clubbing, or edema.  NEUROLOGIC:  The patient is alert, oriented, and grossly nonfocal.   PROBLEMS:  1. Permanent atrial fibrillation, rate controlled.  2. Status post failed radiofrequency catheter ablation.  3. History of intolerance to FLECAINIDE and AMIODARONE.  4. Chronic diastolic heart failure, compensated.  5. Mild left ventricular systolic dysfunction.  6.  Mild chronic obstructive pulmonary disease.  7. Coumadin secondary to atrial fibrillation.   PLAN:  1. The patient can continue on her current medical regimen.  Her heart      rate is well controlled.      She is very active.  She has not noticed any palpitations.  2. The patient denies any chest pain and she does not need an ischemia      workup at the present time.     Learta Codding, MD,FACC  Electronically Signed    GED/MedQ  DD: 08/04/2008  DT: 08/05/2008  Job #: [id number]

## 2010-10-05 NOTE — Assessment & Plan Note (Signed)
[facility]                          [facility] NOTE   NAME:Deborah [name], Deborah [name]                        MRN:          [id number]  DATE:03/20/2007                            DOB:          05/28/1925    CARDIOLOGIST:  Learta Codding, MD,FACC.   PRIMARY CARE PHYSICIAN:  Doreen Beam, M.D.   REASON FOR VISIT:  Six-month followup.   HISTORY OF PRESENT ILLNESS:  Ms. Funk is a 77 year old female patient  with a history of permanent atrial fibrillation, status post failed  radiofrequency ablation and intolerance to flecainide and amiodarone,  with a CHADS2 score of 2, on chronic Coumadin that is followed by Dr.  Sherril Croon.  She returns to the clinic today for followup.  She notes that she  is doing well.  She denies any significant palpitations.  She denies any  significant shortness of breath.  She describes NYHA class II symptoms.  Denies any orthopnea, PND, or significant pedal edema.  She has noted  some chest pressure over the last couple of weeks on a couple of  occasions.  This has mainly been in the mornings when she awakens.  It  has not awakened her from sleep.  She denies any associated shortness of  breath, nausea, or diaphoresis.  She has had a cough for the last couple  of days with clear sputum production.  She does have a history of what  sounds like COPD, and she uses her albuterol inhaler.  This seems to  help the chest pressure.  She denies any exertional chest pain or  pressure.   The patient's heart rate is elevated in the office today.  She has not  had any tachypalpitations.  Of note, she contacted the office back in  May 2008 over concerns of the recall on Digitek.  She asked if she could  come off of this medication, and she did have it discontinued.   CURRENT MEDICATIONS:  1. Diltiazem 240 mg daily.  2. Coumadin, as directed - followed by Dr. Sherril Croon.  3. Fosamax q.week.  4. Multivitamin.  5. Lasix 40 mg daily.  6. Keppra 250  mg 1/2 at 3 p.m. and 1 at bedtime.  7. Vitamin D.  8. Iron.  9. Darvocet.  10.Albuterol.  11.Flovent.   PHYSICAL EXAMINATION:  GENERAL:  She is a well-nourished, well-developed  female in no acute distress.  VITAL SIGNS:  Blood pressure is 100/60, pulse 117, weight 146 pounds.  HEENT:  Normal.  NECK:  Without JVD.  LYMPHATIC:  Without lymphadenopathy.  CARDIAC:  Normal S1, S2.  Irregularly irregular rhythm.  LUNGS:  Clear to auscultation bilaterally.  ABDOMEN:  Soft, nontender, with normoactive bowel sounds.  No  organomegaly.  EXTREMITIES:  With trace ankle edema bilaterally.  NEUROLOGIC:  She is alert and oriented x3.  Cranial nerves II-XII are  grossly intact.   Electrocardiogram reveals atrial fibrillation, with a heart rate of 133,  normal axis, ST changes in the inferolateral leads.  No significant  change since previous tracings.   IMPRESSION:  1. Permanent atrial fibrillation.  a.     Asymptomatic-rapid ventricular response.      b.     Status post failed radiofrequency catheter ablation.      c.     History of intolerance to flecainide and amiodarone.  2. History of diastolic congestive heart failure.  3. Good left ventricular function, with an ejection fraction of 55%-      60% by echocardiogram in 2004.  4. Normal coronary arteries by catheterization, 2003.  5. Mild mitral regurgitation, and mild aortic stenosis by      echocardiogram in 2004.  6. Moderate pulmonary hypertension.  7. Chronic obstructive pulmonary disease.  8. Coumadin therapy.      a.     CHADS2 score of 2.      b.     Followed by Dr. Sherril Croon.  9. Chest pain.   PLAN:  The patient presents to the office today for followup.  As noted  above, her heart rate is uncontrolled.  She was previously fairly well  controlled on digoxin.  This was discontinued back in the summer.  At  this point in time, we plan to:  1. Reinitiate her digoxin at 0.125 mg daily.  2. Set her up for relook echocardiogram.   It has been 4 years since      she had a study done.  She does have a history of mild aortic      stenosis and mild mitral regurgitation.  She also has a history of      pulmonary hypertension.  3. We will check some labs secondary to her increased ventricular      rate, to include a TSH, a CBC, and a BMET.  4. We will bring her back in followup in the next 7-10 days to recheck      her heart rate.  5. The patient has had some chest discomfort.  This is atypical for      ischemia.  It seems to be more related to her recent URI symptoms      and history of COPD.  We will revisit her      symptoms when she returns in 1 week in followup to decide if we      need to pursue any further testing.      Tereso Newcomer, PA-C  Electronically Signed      Learta Codding, MD,FACC  Electronically Signed   SW/MedQ  DD: 03/20/2007  DT: 03/21/2007  Job #: [id number]   cc:   Doreen Beam

## 2010-10-05 NOTE — Assessment & Plan Note (Signed)
[facility]                          [facility]   NAME:Deborah [name], Deborah [name]                        MRN:          [id number]  DATE:03/29/2007                            DOB:          05/04/1926    HISTORY OF PRESENT ILLNESS:  The patient is a 76 year old female with  permanent atrial fibrillation status post a failed radiofrequency  ablation and INTOLERANCE TO FLECAINIDE AND AMIODARONE.  The patient has  a [place] score of  2 and is on chronic Coumadin therapy.  The patient also  reports now a bronchitis and has been placed on Z-PAC by Dr. Sherril Croon.  She  was appropriately told to cut her Coumadin in half.  The patient says  that she is doing well.  She reports no chest pain, palpitations, or  syncope.  She was brought back into the clinic given the fact that she  had a significantly elevated heart rate during her last office visit,  and Digoxin was added.  She also was scheduled for a two-D  echocardiographic study, which showed a slight decrease in her ejection  fraction to 40 to 45% possibly related to increased heart rates.  The  patient had a prior cardiac catheterization, which showed nonobstructive  coronary artery disease.   MEDICATIONS:  1. Digoxin 0.125 daily.  2. Z-PAC x3 days.  3. Diltiazem 240 mg p.o. daily.  4. Coumadin as directed.  5. Fosamax.  6. Multivitamin.  7. Lasix 40 mg p.o. daily.  8. Keppra.  9. Vitamin D.   PHYSICAL EXAMINATION:  VITAL SIGNS:  Blood pressure 107/73, heart rate  129, weight is 146 pounds.  NECK:  Normal carotid upstroke, no carotid bruits.  LUNGS:  Clear breath sounds bilaterally.  HEART:  Irregular rate and rhythm, normal S1 and S2, no murmurs, rubs,  or gallops.  ABDOMEN:  Soft.  EXTREMITIES:  No cyanosis, clubbing, or edema.   PROBLEM LIST:  1. Chronic atrial fibrillation, poor rate control (asymptomatic).  2. Status post failed radiofrequency catheter ablation.  3. HISTORY OF INTOLERANCE TO  FLECAINIDE AND AMIODARONE.  4. Chronic diastolic heart failure hemodynamically compensated.  5. Normal LV function and normal coronary arteries by catheterization      in 2003.  6. Mild COPD.  7. Coumadin therapy secondary to atrial fibrillation.   PLAN:  1. I have asked the patient to double her Digoxin to 0.25 mg p.o.      daily.  I will also ask her to reassess her blood pressure and      heart rate and if they remain elevated will make further      adjustments, in particular in the case of Diltiazem.  2. I offered her invasive testing if needed.  At the present time, the      patient has no history of coronary disease, and I suspect her      decrease in LV function is secondary to tachycardia-induced      cardiomyopathy and this can be reassessed in the next couple of      weeks.     Vernie Shanks  DeGent, MD,FACC  Electronically Signed    GED/MedQ  DD: 03/29/2007  DT: 03/29/2007  Job #: [id number]   cc:   Doreen Beam

## 2010-10-07 ENCOUNTER — Ambulatory Visit: Payer: Self-pay | Admitting: Cardiology

## 2010-10-08 NOTE — Op Note (Signed)
Deborah [name], Deborah [name]                           ACCOUNT NO.:  [id number]   MEDICAL RECORD NO.:  [id number]                   PATIENT TYPE:  INP   LOCATION:  A225                                 FACILITY:  [facility]   PHYSICIAN:  Lionel December, M.D.                 DATE OF BIRTH:  December 18, 1925   DATE OF PROCEDURE:  10/07/2003  DATE OF DISCHARGE:                                 OPERATIVE REPORT   Ms. Thalman is a 77 year old Caucasian female who presents with upper GI  bleed and anemia. She has chronic GERD and is on PPI.  She also is on  Coumadin for atrial fibrillation. She has remained in normal sinus rhythm  during this hospitalization.  She was given 2 units of PRBCs.  Her  hemoglobin has come up from 8.4 to 10.6.  She also has been given 2 mg of  vitamin K.   The procedure and risks were reviewed with the patient and informed consent  was obtained.   PREOPERATIVE MEDICATIONS:  Cetacaine spray for oropharyngeal topical  anesthesia, Demerol 50 mg IV and Versed 5 mg IV in divided dose.   FINDINGS:  Procedure performed in endoscopy suite.  The patient's vital  signs and O2 saturation were monitored during the procedure and remained  stable.  The patient was placed in the left lateral recumbent position and  Olympus videoscope was passed via the oropharynx without any difficulty into  the esophagus.   ESOPHAGUS:  Mucosa of the esophagus was normal throughout.  Squamocolumnar  junction was unremarkable.   STOMACH:  It was empty and distended very well with insufflation.  A quick  exam of the stomach revealed no abnormalities at body, antrum, or pyloric  channel.  As the scope was retroflexed to examine the angularis, fundus, and  cardia; there was blood noted at the proximal gastric body.  The scope was  straightened and this site was looked at.  It started to bleed actively.  The site was washed and had a tiny rent or hole in the wall.  These findings  were consistent with a Dieulafoy  lesion.  The site was initially injected  with dilute epinephrine.  Some of it was spilled.  A total of 8 mL was used.  It slowed the hemorrhage down.  The site was then coagulated using  __________ good hemostasis. Pictures taken for the record. Examination of  the bulb and descending duodenum was normal.   FINAL DIAGNOSES:  Gastric Dieulafoy with active bleeding which was  controlled with a combination of injection and coagulation therapy.   RECOMMENDATIONS:  1. The patient was given 2 more milligrams of vitamin K subcu.  2. Will continue IV PPI today.  3. Full liquid diet.  4. H. pylori serology will also be checked; and, if positive, she will be     treated.  5. H&H will be repeated later today and  in the a.m.      ___________________________________________                                            Lionel December, M.D.   NR/MEDQ  D:  10/07/2003  T:  10/07/2003  Job:  [id number]   cc:   Dione Booze  [address]  [place]  [place] [address]  Fax: [phone]

## 2010-10-08 NOTE — Assessment & Plan Note (Signed)
[facility]                            [facility]   NAME:Deborah [name], Deborah [name]                        MRN:          [id number]  DATE:12/26/2005                            DOB:          [birth year]    HISTORY OF PRESENT ILLNESS:  The patient is an elderly female with a history  of paroxysmal atrial fibrillation.  The patient has been tried on multiple  antiarrhythmic therapies in the past.  She has a known atriopathy by 2-D  echocardiography.  She appears to be now developed a permanent atrial  fibrillation.  She reports that since Christmas last year she has felt that  her heartbeat has been irregular.  Overall, though, she is relatively  asymptomatic with her underlying rhythm disturbance.  She does feel tired at  times but she is NYHA Class 2.  She denies any chest pain.  She really does  not experience any palpitations at the present time.  She is compliant with  her medical regimen.  She does report leg cramps at night and also reports  upper epigastric pain which appeared to be nocturnal and improves with  getting up at night and walking around.  She does have a history of  gastroesophageal reflux which has been quite severe in the past.   CURRENT MEDICATIONS:  1.  Keppra 250 mg as directed.  2.  Diltiazem 240 mg p.o. daily.  3.  Digitek 0.25 half a tablet p.o. daily.  4.  Lasix 40 mg half a tablet p.o. daily.  5.  Coumadin as directed.  6.  Nexium 40 mg p.o. at bedtime.  7.  Fosamax 70 mg q. Week.  8.  Multivitamin.  9.  Vitamin E.  10. Zinc.  11. Vitamin C.   PHYSICAL EXAMINATION:  VITAL SIGNS:  Blood pressure is 102/60, heart rate is  89 beats per minute, weight is 155.  NECK:  Exam reveals normal carotid upstroke and no carotid bruits.  LUNGS:  Clear bilaterally.  HEART:  Regular rate and rhythm.  Normal S1 and S2.  The heart rate is  irregular.  There is no pathological murmur.  ABDOMEN:  Soft and nontender with no  rebound or guarding.  Good bowel  sounds.  EXTREMITY:  Exam reveals no cyanosis or clubbing.  There are some minor  varicosities.   Twelve-lead electrocardiogram:  Atrial fibrillation with nonspecific ST-T  wave changes.   PROBLEM LIST:  1.  Paroxysmal atrial fibrillation, now permanent atrial fibrillation.  2.  Status post failed radiofrequency ablation.  3.  History of intolerance to flecainide and amiodarone therapy.  4.  Congestive heart failure with biatrial enlargement (atriopathy).  5.  Normal coronaries by catheterization 2003.  6.  Moderate pulmonary hypertension, mild tricuspid regurgitation with      chronic dyspnea (New York Heart Association Class 2).   PLAN:  1.  I have asked the patient to add a BNP level to her lab work that will be      done with Dr. Doyne Keel in the next couple of weeks.  2.  If the  patient has significantly elevated BNP level, I would recommend      that she increase her Lasix to 40 mg a day.  She does appear to have      cramps on higher doses of Lasix and will need potassium supplement.  The      patient has a history of diastolic dysfunction and known atriopathy and      may need, as outlined above, increased diuretic dosing.  3.  The patient now appears to be in permanent atrial fibrillation but rate      controlled and there is essentially no change in her symptoms that have      occurred.  Will continue to treat her with rate control and      anticoagulation therapy.                                   Learta Codding, MD, [facility]   GED/MedQ  DD:  12/26/2005  DT:  12/26/2005  Job #:  [id number]   cc:   Wende Crease, MD

## 2010-10-08 NOTE — Assessment & Plan Note (Signed)
[facility]                          [facility]   NAME:Deborah [name], Deborah [name]                        MRN:          [id number]  DATE:08/03/2006                            DOB:          April 30, 1926       Gene Serpe, PA-C       Learta Codding, MD,FACC    GS/MedQ  DD: 08/01/2006  DT: 08/03/2006  Job #: [id number]

## 2010-10-08 NOTE — Discharge Summary (Signed)
Deborah [name], Deborah [name]                           ACCOUNT NO.:  [id number]   MEDICAL RECORD NO.:  [id number]                   PATIENT TYPE:  INP   LOCATION:  2032                                 FACILITY:  [facility]   PHYSICIAN:  Learta Codding, M.D. [facility]             DATE OF BIRTH:  12-22-1925   DATE OF ADMISSION:  04/15/2002  DATE OF DISCHARGE:  04/19/2002                           DISCHARGE SUMMARY - REFERRING   HISTORY:  The patient is a 77 year old white female with a long history of  tachyarrhythmias.  She presented at this time with chest discomfort.  She  described it as sudden in onset which awoke her from sleep at 11 p.m. and  lasted until 2:30 a.m.  She described it as a squeezing dull ache almost  like a heating pad pressing on her chest, located at her left chest over her  breast, radiating up into her neck.  She also had tingling down both arms  and also both legs accompanied by nausea.  She also states that in the  preceding two weeks she has also been noting a squeezing discomfort in her  upper chest with exertion, which she had been getting some relief with rest  and taking Darvocet and Tylenol which she is on for a chronic thoracic  compression fracture from her osteoporosis.  She has a history of  tachyarrhythmias/paroxysmal atrial fibrillation, atrial flutter with 2:1  block, and a history of SVT.   A stress echocardiogram in 6/02, was felt to be suboptimal, but there is  nothing to suggest a high grade wall motion abnormality.  She was noted to  exercise at her target heart rate and she had an accelerated blood pressure  response, but nondiagnostic EKG changes.  An echocardiogram in 1/02, showed  mild LVH with preserved LV function, bilateral atrial enlargement.  Dr.  Ladona Ridgel is also seeing her for the possibility of V/Q study versus  Flecainide.  After being seen at [facility], her Coumadin was held  and she was transferred to [facility] in anticipation  of a cardiac  catheterization.   LABORATORY DATA:  [place] labs are not available at the time of this  dictation.  Her admission PT was 21.7, INR 2.1; on the November 26, her H&H  was 13.0 and 38.5, normal indices and platelets 191, WBC 6.2.  On November  26, her INR is 1.4 and on November 28, her INR was 1.8.   Chest x-ray did not show any acute cardiac or pulmonary processes.  She had  marked osteoporosis with midthoracic spine compression fractures.   Initial EKG shows atrial fibrillation with ventricular rate 92, normal axis,  nonspecific ST-T wave changes.  Subsequent EKG showed sinus bradycardia with  sinus arrythmia; otherwise, unremarkable.   HOSPITAL COURSE:  She was transferred to [facility] and her  Coumadin was held.  She was continued on her other medications.  Once her  INR was below 2.0, IV heparin was started.  On November 26, cardiac  catheterization was performed by Dr. Chales Abrahams.  This showed an ejection  fraction of 55%, calcified left main, 20% LAD, 50% diagonal-2, 20% OM, 20-  30% circumflex, dominant RCA, 30% mid lesion, 40% PDA.  Dr. Ladona Ridgel, after  review on November 26, felt that the patient was familiar to him from  previous clinic visit on April 02, 2002.   Her EP study and possible ablation were initially scheduled for April 24, 2002.  It was felt that since she was off her Coumadin at this time, Dr.  Ladona Ridgel would proceed with the EP study and RSA, and initiation of  Flecainide.  On November 26, she performed EP study and mapping of the  tachycardia.  She was given Ibutilide 1 mg and converted to normal sinus  rhythm.  Dr. Ladona Ridgel started Flecainide 50 mg p.o. b.i.d. at that time and  resumed her Coumadin.   On November 27, the patient reported that she had a headache post her p.m.  dose of Flecainide that lasted for about one hour.  It was noted that she  was in sinus bradycardia with a heart rate in the 50s, thus Dr. Andee Lineman  decreased her  Cardizem from 240 to 120.  He initially felt that she should  stay in the hospital until INR was therapeutic.  On November 28, after  review and long discussion with Dr. Ladona Ridgel, Dr. Margarita Mail initial plans of  discharging the patient on Lovenox overlap until her INR was therapeutic was  changed.  He felt that she could be discharged home without the overlap.   DISCHARGE DIAGNOSES:  1. Noncardiac chest discomfort.  2. Recurrent atrial fibrillation, status post conversion to normal sinus     rhythm with Ibutilide.  3. Flecainide initiation.  4. History as previously.   DISPOSITION:  She is discharged to home.   DISCHARGE MEDICATIONS:  1. Flecainide 50 mg b.i.d.  2. Cardizem was decreased to 120 mg daily.  3. She was asked to continue her Coumadin 5 mg daily except 2.5 mg on Monday     and Friday.  4. Zantac 150 mg b.i.d.  5. Fosamax 70 mg weekly.  6. Ventolin two puffs b.i.d. as needed.  7. Atrovent two puffs b.i.d. and as needed.  8. Keppra 250 mg 1/2 tablet b.i.d.  9. Baby aspirin 81 mg daily   DISCHARGE DIET:  She was asked to maintain a low salt, fat, and cholesterol  diet.   DISCHARGE INSTRUCTIONS:  She will have her PT/INR checked at Dr. Margarita Mail  office on Monday at 9 a.m.  She was instructed this would be an one time  only check and then she could continue her followups for her routine PT INRs  with Dr. Doyne Keel, maintaining an INR target between 2 and 3.  She will see  Dr. Margarita Mail PA at the [facility] office on Thursday, April 25, 2002, at 1:45  p.m.  An EKG will be checked at that time.  If she is not bradycardic, her  Flecainide should be increased to 100 mg b.i.d. with a modified Bruce stress  test within the next seven days after her dosage of Flecainide was  increased.     Joellyn Rued, P.A.-C  [facility]                 Learta Codding, M.D. [facility]    EW/MEDQ  D:  04/19/2002  T:  04/19/2002  Job:  [id number]  cc:   Wende Crease, M.D.   [address] [place]   [address] [facility],

## 2010-10-08 NOTE — H&P (Signed)
[name], [name]                           ACCOUNT NO.:  [id number]   MEDICAL RECORD NO.:  [id number]                   PATIENT TYPE:  INP   LOCATION:  A225                                 FACILITY:  [facility]   PHYSICIAN:  Lionel December, M.D.                 DATE OF BIRTH:  19-Jan-1926   DATE OF ADMISSION:  10/06/2003  DATE OF DISCHARGE:                                HISTORY & PHYSICAL   PRIMARY CARE PHYSICIAN:  Dr. Doyne Keel   REASON FOR ADMISSION:  Upper GI bleed, melena, hematemesis.   HISTORY OF PRESENT ILLNESS:  [name] is a 77 year old Caucasian  female who reports approximately yesterday afternoon postprandially  developing severe nausea.  She states this was followed by a normal soft  brown bowel movement, then severe nausea, followed by hematemesis x 1.  This  was then followed by large-volume melena.  Since this episode, she has had  at least two more episodes of melena.  She denies any associated abdominal  pain.  She does report yesterday having a slight retrosternal chest pressure  and pain noted.  She notes history of chronic GERD, currently on Prilosec 20  mg b.i.d. as well as Zantac 150 mg b.i.d., being followed by Dr. Doyne Keel.  She denies any history of EGD.  She reports colonoscopy by Dr. Karilyn Cota two to  three years ago which was normal except for two small polyps; however, I do  not have this report yet.  She denies any dysphagia or odynophagia.  She  denies any constipation or diarrhea.  Bowel movements are typically soft,  brown, and on a daily basis.  She has been on Coumadin for the last three  years for history of atrial fibrillation.  She also has history of mitral  valve prolapse.  She is also on aspirin 81 mg concomitantly.  She reports  occasional palpitations and shortness of breath last evening.  She denies  any history of peptic ulcer disease or colorectal carcinoma.   PAST MEDICAL HISTORY:  1. Seizure disorder.  2. Hemorrhoids.  3. Atrial  fibrillation, currently on Coumadin, followed by Dr. Andee Lineman,     [place].  4. Mitral valve prolapse.  5. Osteoporosis.  6. Chronic GERD.   PAST SURGICAL HISTORY:  1. Bilateral cataracts.  2. Right carpal tunnel release.  3. Tonsillectomy.  4. Left benign breast biopsy.  5. Colonoscopy reportedly two to three years ago with two benign polyps, by     Dr. Karilyn Cota.   MEDICATIONS:  1. Prilosec 20 mg b.i.d.  2. Zantac 150 mg b.i.d.  3. Diltiazem 240 mg daily.  4. Aspirin 81 mg a day.  5. Digoxin 0.125 mg daily.  6. Keppra 250 mg 1-1/2 tablets daily.  7. Coumadin 5 mg 4 out of 7 days a week, 2.5 mg 3 out of 7 days a week.  8. Darvocet-N 100 p.r.n.  9. Vitamin E,  C, and zinc, multivitamins daily.  10.      Ventolin 2 puffs daily.  11.      Flovent 2 puffs daily.  12.      Fosamax weekly.   ALLERGIES:  No known drug allergies.  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   FAMILY HISTORY:  No known family history of colorectal carcinoma, liver or  chronic GI problems.  Mother alive at age 63 with history of coronary artery  disease and osteoporosis.  Father deceased at age 40 with significant  history of coronary artery disease and MI.  She has one brother and four  sisters with history significant for reactive airway disease.   SOCIAL HISTORY:  [name] is currently a widow and lives alone.  She has  one daughter who lives nearby.  She is a Futures trader.  She reports a 24-year  history of one-half to one pack-per-day tobacco use, quitting approximately  15 years ago.  Denies any alcohol or drug use.   REVIEW OF SYSTEMS:  CONSTITUTIONAL:  Weight has been relatively stable.  She  does report an 8-pound weight loss in the last two years; however, she  reports decreasing her intake of saturated fats and watching her sodium  intake as well.  She denies any fever or chills.  Appetite is good.  Denies  any fatigue.  CARDIOVASCULAR:  As described in HPI.  She is being followed  by Dr.  Andee Lineman of [place] for mitral valve prolapse as well as atrial  fibrillation.  PULMONARY:  She does report occasional shortness of breath.  This has been worse over the last 24 hours. Denies any cough or hemoptysis.  GI:  See HPI.  HEMATOLOGIC:  Does report history of easy bruising and  bleeding since on Coumadin.  Denies any known history of anemia or blood  dyscrasias.  GYN:  [name] is post menopausal.   PHYSICAL EXAMINATION:  VITAL SIGNS:  Temperature 98.0, pulse 63,  respirations 12, blood pressure 112/66.  Height 54 inches, weight 150.6.  GENERAL:  [name] is a 77 year old Caucasian female who appears pale.  She is lying supine with head of bed elevated 45 degrees.  She is in no  acute distress.  She is alert, oriented, pleasant, cooperative.  HEENT:  Sclerae clear.  Conjunctivae pale.  Oropharynx pale and moist  without any lesions.  NECK:  Supple.  She does have a left cervical large purpuric area which is a  birthmark.  CHEST: Heart rate has 3/6 systolic ejection murmur.  LUNGS:  Clear to auscultation bilaterally.  ABDOMEN:  Positive bowel sounds x 4.  Soft, nontender, nondistended with no  palpable mass or hepatosplenomegaly.  RECTAL:  Deferred.  R.N. notes gross melena today.  EXTREMITIES:  Good pulses bilaterally.  Right lower extremity has large  ecchymotic area.  SKIN:  Pale, warm, and dry.  She does have multiple ecchymotic areas.   LABORATORY AND X-RAY DATA:  WBC 9.2, hemoglobin 9.8, hematocrit 28.8,  platelets 244.  Calcium 10, sodium 141, potassium 3.9, chloride 106, CO2 28,  BUN 52, creatinine 0.8, glucose 107.  INR 2.1 per Dr. Patty Sermons report.   IMPRESSION:  [name] is a 77 year old Caucasian female with history  of chronic gastroesophageal reflux disease on concomitant aspirin as well as  Coumadin therapy with two-day history of nausea, hematemesis, and melena. [name] most likely has peptic ulcer disease given her significant risk  factors.   Would recommend further evaluation for  this.  She has had fairly  recent colonoscopy, and, therefore, this is reassuring.  If bleeding is not  found on EGD, would recommend further evaluation with capsule study.   RECOMMENDATIONS:  1. Will plan on EGD at 7 in the morning by Dr. Karilyn Cota. I have discussed this     procedure including risks and benefits which include but not limited to     bleeding, infection, perforation, and drug reaction.  She agrees with     this plan.  Consent will be obtained.  She can have clear liquid diet     today, n.p.o. post midnight.  Labs today to include CBC, MET-7, INR, type     and cross for 2 units packed red blood cells, and consider transfusion if     hemoglobin drops below 8 g, IV fluids, normal saline 100 ml an hour,     initiate PPI therapy.  Will check digoxin level.  Will hold Coumadin for     now.  Will give 1 mg vitamin K subcutaneously now and check PT and INR at     5 a.m. prior to EGD.  Will also give SBE prophylaxis given mitral valve     prolapse.  2. Further recommendations pending procedure.   We would like to thank Dr. Doyne Keel for allowing [place] to participate in the  care of Deborah [name].     _____________________________________  ___________________________________________  Nicholas Lose, N.P.               Lionel December, M.D.   KC/MEDQ  D:  10/06/2003  T:  10/06/2003  Job:  [id number]   cc:   Dr. Aundra Millet, [place]

## 2010-10-08 NOTE — Discharge Summary (Signed)
NAMEJULYA, Deborah [name]                           ACCOUNT NO.:  [id number]   MEDICAL RECORD NO.:  [id number]                   PATIENT TYPE:  INP   LOCATION:  A225                                 FACILITY:  [facility]   PHYSICIAN:  Lionel December, M.D.                 DATE OF BIRTH:  [birth year]   DATE OF ADMISSION:  10/06/2003  DATE OF DISCHARGE:  10/08/2003                                 DISCHARGE SUMMARY   DISCHARGE DIAGNOSES:  1. Two unit upper gastrointestinal bleed secondary to gastric Dieulafoy     lesion.  2. Anemia, secondary to above.  3. Chronic gastroesophageal reflux disease.  Symptoms poorly controlled.  4. Seizure disorder.  5. Osteoporosis.  6. Paroxysmal atrial fibrillation.  7. Osteoarthrosis.   CONDITION ON DISCHARGE:  Much improved.   PRINCIPAL PROCEDURE:  Vasovagal gastroduodenoscopy with injection and  coagulation therapy to control GI bleed.   DISCHARGE MEDICATIONS:  1. Nexium 40 mg p.o. q.a.m.  2. _________ liquid a.c. and q.h.s. for one week only.  3. Diltiazem 240 mg daily.  4. Lenoxin 0.125 mg daily.  5. Keppra 125 mg in the a.m. and 250 mg in the p.m.  6. Fosamax 70 mg once weekly.  7. Ventolin inhaler, two puffs q.i.d. p.r.n.  8. Flovent inhaler, as before.  9. She can resume her MVI, zinc, vitamin C & E.  10.      She can take Darvocet-N 100, one b.i.d. p.r.n.   HOSPITAL COURSE:  Deborah [name] is a 77 year old Caucasian female who presented with  hematemesis, melena, and weakness.  She was documented to have tarry, heme  positive stools.  She appeared to be pale.  Admission H&H was 9.8 & 28.2.  WBC was 9.2, platelet count was 244,000.  Her chemistry panel was normal,  except BUN was 52 and creatinine was 0.8.  BUN was elevated to be secondary  to GI bleed.  Her INR was 2.1.  This was felt to be within a desirable  range.  Given that the patient was anticoagulated it was felt that she  needed to be hospitalized.  She was admitted to a monitored bed.  She was  typed and cross-matched.  She was begun on IV PPI.  She was noted to have a  normal sinus rhythm throughout the hospitalization.  Her H&H was followed,  and it dropped to 8.4 & 25.3.  She was therefore given two units of PRBCs.  This brought her H&H to 11.1, but subsequently dropped to 10.2, and then  back up to 10.7.  She was given IV fluids, and felt a lot better.   She underwent vasovagal gastroduodenoscopy on 10/07/2003.  She had a normal  examination of her esophagus and GE junction in spite of poorly controlled  symptoms of GERD.  She was noted to have active bleeding at the seawall of  the gastric body.  This area was washed,  then the Dieulafoy lesion was  noted.  This area was initially injected with dilute epinephrine and  subsequently coagulated with complete hemostasis.  Post procedure, the  patient exhibited no symptoms or signs of bleeding.  She was begun on a  fluid liquid diet.  She was also given 1 mg of vitamin K on admission, and  given two more mg of vitamin K soon after EGD, since her INR was still 2.0  range.  By the next morning her INR had corrected down to 1.4.  She  ambulates the floor.  She tolerated her diet.  She was discharged home.  She  will return for OV on 05/17/2004.  She will have a CBC prior to her office  visit.  PLEASE NOTE:  The patient was asked to hold off her ASA and  Coumadin.   PLAN:  The plan is for her to go back on her Coumadin following her office  visit.   PLEASE NOTE:  We also checked her Digoxin level, and it was within  therapeutic range.  At the time of admission the patient has been on  Prilosec, which was not controlling her heartburn.  She was therefore  switched to Nexium 40 mg q.a.m.  Further changes in therapy would be made at  the time of office visit.     ___________________________________________                                         Lionel December, M.D.   NR/MEDQ  D:  10/13/2003  T:  10/14/2003  Job:  [id number]   cc:    Lucretia Field, [place]

## 2010-10-08 NOTE — Cardiovascular Report (Signed)
Deborah [name], Deborah [name]                           ACCOUNT NO.:  [id number]   MEDICAL RECORD NO.:  [id number]                   PATIENT TYPE:  INP   LOCATION:  2032                                 FACILITY:  [facility]   PHYSICIAN:  Veneda Melter, M.D. [facility]               DATE OF BIRTH:  10-Jul-1925   DATE OF PROCEDURE:  04/17/2002  DATE OF DISCHARGE:                              CARDIAC CATHETERIZATION   PROCEDURES PERFORMED:  1. Left heart catheterization.  2. Left ventriculogram.  3. Selective coronary angiography.   DIAGNOSES:  1. Mild coronary artery disease by angiogram  2. Normal left ventricular systolic function.  3. Paroxysmal atrial fibrillation.  4. Supraventricular tachycardia.   HISTORY:  The patient is a 77 year old white female who has had worsening  palpitations.  The patient was scheduled to undergo radiofrequency ablation  of SVT on December 3 with Dr. Ladona Ridgel.  She has also had a history of  paroxysmal atrial fibrillation requiring Coumadin therapy. Recently she  developed substernal chest discomfort prompting admission to the hospital.  She was stabilized medically and ruled out for acute myocardial infarction  and her Coumadin discontinued.  She is referred now for cardiac angiography.   TECHNIQUE:  Informed consent was obtained, patient brought to the cardiac  catheterization lab, and a 6 French sheath was placed in the right femoral  artery using the modified Seldinger technique. A 6 [place] and JR4  catheters were then used to engage the left and right coronary arteries, and  selective angiography performed in various projections using manual  injections of contrast. A 6 French pigtail catheter was advanced to the left  ventricle and left ventriculogram performed using power injections of  contrast. At the termination of the case, the catheters and sheaths were  removed and manual pressure applied until adequate hemostasis was achieved.  The patient tolerated the  procedure well and was transferred to the ward in  stable condition.   FINDINGS:  1. Left main trunk:  Medium caliber vessel, heavily calcified and mild     irregularities.  2. LAD:  This is a small caliber vessel that provides three trivial diagonal     branches.  The LAD has mild irregularities not greater than 20%.  The     second diagonal branch has an ostial narrowing of 50%.  3. Left circumflex artery:  This is a medium caliber vessel that provides     two marginal branches. There are mild irregularities of 20-30% in the     left circumflex system.  4. Right coronary artery:  Dominant.  This is a medium caliber vessel that     provides the posterior descending artery and posterior ventricular branch     in its terminal segment. The mid section of the vessel has a calcific     eccentric plaque of 30%.  There is also mild disease of 30% in the  posterior descending and posterior ventricular branches.   LEFT VENTRICULOGRAM:  Normal end-systolic and end-diastolic dimensions.  Overall left ventricular function is well preserved, ejection fraction of  greater than 55%.  No mitral regurgitation.  LV pressure is 110/5, aortic is  110/70, LVEDP equals 13.  The patient is in normal sinus rhythm during  catheterization.   ASSESSMENT AND PLAN:  The patient is a 77 year old female with noncritical  coronary artery disease and well preserved left ventricular function.  Continued medical therapy will be pursued and other causes of chest pain  investigated.  The patient will be scheduled for radiofrequency ablation of  her supraventricular tachycardia.                                                 Veneda Melter, M.D. [facility]    NG/MEDQ  D:  04/17/2002  T:  04/18/2002  Job:  [id number]   cc:   Learta Codding, M.D. [facility]   Wende Crease, M.D.

## 2010-10-08 NOTE — Op Note (Signed)
NAMEJUBILEE, Deborah [name]                           ACCOUNT NO.:  [id number]   MEDICAL RECORD NO.:  [id number]                   PATIENT TYPE:  INP   LOCATION:  2032                                 FACILITY:  [facility]   PHYSICIAN:  Doylene Canning. Ladona Ridgel, M.D. [facility]           DATE OF BIRTH:  [birth year]   DATE OF PROCEDURE:  04/17/2002  DATE OF DISCHARGE:                                 OPERATIVE REPORT   PROCEDURE:  Invasive electrophysiologic study with reduction in arrhythmia  with pacing and mapping of the tachycardia.   CARDIOLOGIST:  Doylene Canning. Ladona Ridgel, M.D.   INDICATIONS:  The patient is a very pleasant 77 year old woman with a  history of atrial fibrillation as well as atrial flutter who was seen by me  two weeks ago for evaluation of the above problem.  She was set up for  catheter ablation of her atrial flutter.  She was admitted to the hospital  two days ago with recurrent chest pain and subsequently underwent  catheterization demonstrating no obstructive coronary disease and preserved  LV systolic function.  The etiology of the patient's chest pain is unsure.  She is now referred for EP study and possible catheter ablation of her  atrial flutter.   PROCEDURE:  After informed consent was obtained, the patient was taken to  the diagnostic EP lab in the fasting state.  After the usual preparation and  draping, fentanyl and midazolam were given for sedation.  A 6 French  hexapolar catheter was inserted percutaneously in the right jugular vein and  advanced to the coronary sinus.  A 5 French quadripolar catheter was  inserted percutaneously in the right femoral vein and advanced to the His  bundle region.  A 7 French quadripolar catheter was inserted percutaneously  in the right femoral vein and advanced to the right atrium.  At baseline,  the patient was in normal sinus rhythm.  Programmed atrial stimulation was  carried out from the coronary sinus and had a basic cycle length of 500  msec.   The S1, S2 interval was stepwise decreased from 390 msec down to 220  msec where atrial refractionists was observed.  During programmed atrial  stimulation, there was no inducible tachycardia.  Next, rapid atrial pacing  was carried out from the coronary sinus demonstrating and AV Wenckebach  cycle length of 290 msec.  Additional rapid atrial pacing down to the 220  msec resulted in initiation of atrial flutter.  This was a left atrial  flutter with early activation in the distal coronary sinus.  The right  atrial activation was quite variable.  During the mapping of the patient's  left atrial flutter, there would be transient atrial fibrillation.  Because  this was not felt to be an ablatable rhythm, ibutilide 1 mg was delivered,  and the patient was observed until normal sinus rhythm was restored  spontaneously.  The catheters were then removed.  Hemostasis was assured and  the patient returned to her room in satisfactory condition.   COMPLICATIONS:  There were no immediate procedural complications.   RESULTS:  1. Baseline 12-lead electrocardiogram:  The baseline 12-lead EKG     demonstrates normal sinus rhythm with normal axis and normal intervals.     The sinus node cycle length was 1022 msec.  The PR interval was 180 msec.     The HV interval 37 msec.  QRS duration was 110 msec.   1. Rapid atrial pacing demonstrated AV Wenckebach cycle length of 290 msec.     Additional rapid atrial pacing resulted in the initially left atrial     flutter which spontaneously transitioned into atrial fibrillation.   1. Programmed atrial stimulation: Programmed atrial stimulation was carried     out from the coronary sinus with a basic cycle length of 500 msec.  The     S1 and S2 intervals were stepwise decreased down  to 360 msec where     atrial refractionists was observed.  During programmed atrial stimulation     there were no junctional echo beats noted.  There was no inducible SVT.   1.  Rhythms observed.     a. Atrial flutter.  Initiation of rapid atrial pacing.  Duration        sustained.  Cycle length variable.  Activation sequence earliest in        the left atrium.     b. Atrial fibrillation:  Initiation spontaneous.  Duration sustained.        Termination ibutilide.   1. Mapping:  Mapping of the patient's atrial flutter demonstrated the     earliest activation in the left atrium with the distal coronary sinus     activated before the proximal coronary sinus.  At other times, mapping of     the patient's arrhythmia demonstrated variable atrial activation with     multiple fractionated lithograms both in the left and right atrium     consistent with a very course atrial fibrillation.   CONCLUSION:  Study demonstrates inducible left atrial flutter with  spontaneous transition into and out of atrial fibrillation.  It also  demonstrates successful chemical cardioversion with 1 mg of ibutilide  resulting in restoration of sinus rhythm.   PLAN:  The patient will be to admit the patient for flecainide initiation  and continuation of her Coumadin therapy.                                               Doylene Canning. Ladona Ridgel, M.D. [facility]    GWT/MEDQ  D:  04/17/2002  T:  04/18/2002  Job:  [id number]   cc:   Wende Crease, M.D.   Learta Codding, M.D. [facility]   Kathrine Cords, R.N. [facility]  [address]  [place], [place] [address]  Fax: 1

## 2010-10-08 NOTE — Assessment & Plan Note (Signed)
[facility]                          [facility]ICE NOTE   NAME:Deborah [name], Deborah [name]                        MRN:          [id number]  DATE:08/03/2006                            DOB:          [birth year]    PRIMARY CARDIOLOGIST:  Dr. Lewayne Bunting.   REASON FOR VISIT:  A 15-month followup.   Since last seen here in the clinic in August of 2007, by Dr. Andee Lineman, the  patient is doing extremely well from a clinical standpoint.  In fact,  she states that she feels fine.  She even proudly points out to me  that she has only taken 2 tablets of short-acting diltiazem for episodes  of tachy palpitations since this was first given to her in November of  2005.   The patient is on chronic Coumadin which is closely followed by Dr.  Sherril Croon.   The patient reports no significant development of exertional dyspnea  since her last visit and denies any exertional chest discomfort.  She  has not had any episodes of tachy palpitations for the past 2 years.   CURRENT MEDICATIONS:  1. Diltiazem 240 daily.  2. Digitek 0.125 daily.  Coumadin as directed.  3. Nexium 40 daily.  4. Fosamax q. weekly.  5. Lasix 40 daily.  6. Keppra 125 b.i.d.   PHYSICAL EXAM:  Blood pressure 110/67, pulse 54 and irregular, weight  145.4.  GENERAL:  A 77 year old female sitting upright in no distress.  HEENT:  Normocephalic, atraumatic.  NECK:  Palpable bilateral carotid pulses without bruits.  No JVD.  LUNGS:  Clear to auscultation in all fields.  HEART:  Irregularly irregular (S1, S2), a soft grade 2/6 systolic murmur  at the base.  ABDOMEN:  Soft, nontender.  EXTREMITIES:  2+ bilateral nonpitting lower extremity edema.  NEURO:  No focal deficit.   IMPRESSION:  1. Permanent atrial fibrillation.      a.     Status post failed radiofrequency ablation.      b.     History of intolerance to flecainide/amiodarone.  2. History of congestive heart failure.      a.     Normal coronary  angiogram 2003.      b.     Ejection fraction 55-60% by 2D echo in November 2004.  3. Valvular heart disease.      a.     Mild mitral regurgitation/mild aortic stenosis/moderate       tricuspid regurgitation by 2D echo in 2004.  4. Moderate pulmonary hypertension.  5. Chronic Coumadin.      a.     Followed by Dr. Sherril Croon.   PLAN:  1. Continue current medication regimen.  2. Consider followup surveillance 2D echo at the time of next      scheduled visit.  3. Schedule return clinic followup with Dr. Andee Lineman in 6 months.      Gene Serpe, PA-C  Electronically Signed      Learta Codding, MD,FACC  Electronically Signed   GS/MedQ  DD: 08/01/2006  DT: 08/03/2006  Job #: [id number]   cc:   Herminio Commons  Woody Seller

## 2010-10-11 ENCOUNTER — Other Ambulatory Visit: Payer: Self-pay | Admitting: Cardiology

## 2010-12-21 ENCOUNTER — Encounter: Payer: Self-pay | Admitting: Cardiology

## 2010-12-29 ENCOUNTER — Ambulatory Visit (INDEPENDENT_AMBULATORY_CARE_PROVIDER_SITE_OTHER): Payer: Medicare Other | Admitting: Cardiology

## 2010-12-29 ENCOUNTER — Encounter: Payer: Self-pay | Admitting: Cardiology

## 2010-12-29 VITALS — BP 90/59 | HR 81 | Ht 63.0 in | Wt 143.8 lb

## 2010-12-29 DIAGNOSIS — E785 Hyperlipidemia, unspecified: Secondary | ICD-10-CM

## 2010-12-29 DIAGNOSIS — Z7901 Long term (current) use of anticoagulants: Secondary | ICD-10-CM | POA: Insufficient documentation

## 2010-12-29 DIAGNOSIS — I4891 Unspecified atrial fibrillation: Secondary | ICD-10-CM

## 2010-12-29 DIAGNOSIS — I2510 Atherosclerotic heart disease of native coronary artery without angina pectoris: Secondary | ICD-10-CM

## 2010-12-29 DIAGNOSIS — I10 Essential (primary) hypertension: Secondary | ICD-10-CM

## 2010-12-29 NOTE — Assessment & Plan Note (Signed)
No bleeding complications, but the patient needs monitoring with a CBC and renal function studies. This has been ordered. I recommended to her that this will be done every 6 months.

## 2010-12-29 NOTE — Assessment & Plan Note (Signed)
Continue statin drug therapy and monitoring of lipid panel LFTs by primary care physician

## 2010-12-29 NOTE — Progress Notes (Signed)
Wants to know is okay to take plain Tylenol for pain ?

## 2010-12-29 NOTE — Patient Instructions (Signed)
Your physician recommends that you go to the [facility] for lab work for [employer]. If the results of your test are normal or stable, you will receive a letter.  If they are abnormal, the nurse will contact you by phone. Your physician wants you to follow up in: 6 months.  You will receive a reminder letter in the mail one-two months in advance.  If you don't receive a letter, please call our office to schedule the follow up appointment

## 2010-12-29 NOTE — Assessment & Plan Note (Signed)
No evidence of heart failure symptoms. Ejection fraction normalized by Lexi scan in 2010 and reported to 69%.

## 2010-12-29 NOTE — Assessment & Plan Note (Signed)
Patient is in chronic atrial fibrillation and rate is well-controlled. She is on anticoagulation with dabigatran

## 2010-12-29 NOTE — Progress Notes (Signed)
HPI The patient is an 77 year old female with permanent atrial fibrillation, nonobstructive coronary artery disease, status post stent placement with a bare-metal stent to the circumflex coronary artery. She is on chronic anticoagulation with dabigatran. She has done well with this and reports no bleeding complications. However she reported she had no followup on her renal function. Last office visit in October of 2011 the patient had some reports of chest pain but this has completely resolved and she has not required any nitroglycerin. She actually feels remarkably well. She is able to do activities of daily living without any constraints or limitations. Her blood pressures relatively low but she states that's usual for her and she is asymptomatic with this. In particular she has no symptoms of orthostasis. She reports no abdominal complaints. She also has no further cardiovascular complaints like palpitations orthopnea or PND  Allergies  Allergen Reactions  . Carbamazepine     REACTION: toxemia    Current Outpatient Prescriptions on File Prior to Visit  Medication Sig Dispense Refill  . albuterol (VENTOLIN HFA) 108 (90 BASE) MCG/ACT inhaler Inhale 2 puffs into the lungs 4 (four) times daily as needed.        Marland Kitchen aspirin 81 MG EC tablet Take 81 mg by mouth daily.        . Calcium Carbonate-Vit D-Min (CALTRATE 600+D PLUS) 600-400 MG-UNIT per tablet Take 1 tablet by mouth 2 (two) times daily.        . dabigatran (PRADAXA) 150 MG CAPS Take 150 mg by mouth 2 (two) times daily.        Marland Kitchen diltiazem (CARDIZEM CD) 120 MG 24 hr capsule Take 120 mg by mouth daily.        . ergocalciferol (VITAMIN D2) 50000 UNITS capsule Take 50,000 Units by mouth once a week.        . furosemide (LASIX) 40 MG tablet Take 20 mg by mouth daily.        . IMDUR 30 MG 24 hr tablet TAKE 1 TABLET BY MOUTH   ONCE DAILY AS DIRECTED.  30 each  6  . LANOXIN 0.125 MG tablet TAKE ONE TABLET BY MOUTH ONCE DAILY.  30 each  6  .  levETIRAcetam (KEPPRA) 250 MG tablet Take by mouth 2 (two) times daily. 1/2 at 3 and 1 QHS       . Multiple Vitamins-Minerals (EYE-VITES) TABS Take 1 tablet by mouth daily.        . potassium chloride (KLOR-CON) 10 MEQ CR tablet Take 10 mEq by mouth daily.        Marland Kitchen PRAVACHOL 40 MG tablet TAKE 1 TABLET BY MOUTH ATBEDTIME FOR CHOLESTEROL.  30 each  3    Past Medical History  Diagnosis Date  . Decreased left ventricular function     EF normalized by Lexiscan 2010: 69%  . Chronic diastolic heart failure   . A-fib     permanent; rate controlled   . COPD (chronic obstructive pulmonary disease)   . Drug intolerance     Flecacinide and Amiodarone     Past Surgical History  Procedure Date  . Radiofrequency catheter ablation     failed  . Right carpel tunnel release   . Cataract extraction   . Tonsillectomy     Family History  Problem Relation Age of Onset  . Coronary artery disease      fhx     History   Social History  . Marital Status: Widowed    Spouse Name:  N/A    Number of Children: N/A  . Years of Education: N/A   Occupational History  . Not on file.   Social History Main Topics  . Smoking status: Former Smoker    Quit date: 05/23/1990  . Smokeless tobacco: Not on file  . Alcohol Use: No  . Drug Use: No  . Sexually Active: Not on file   Other Topics Concern  . Not on file   Social History Narrative   Widowed.    [id number] positives as outlined above. The remainder of the 18  point review of systems is negative PHYSICAL EXAM BP 90/59  Pulse 81  Ht 5\' 3"  (1.6 m)  Wt 143 lb 12.8 oz (65.227 kg)  BMI 25.47 kg/m2  General: Well-developed, well-nourished in no distress Head: Normocephalic and atraumatic Eyes:PERRLA/EOMI intact, conjunctiva and lids normal Ears: No deformity or lesions Mouth:normal dentition, normal posterior pharynx Neck: Supple, no JVD.  No masses, thyromegaly or abnormal cervical nodes Lungs: Normal breath sounds bilaterally without  wheezing.  Normal percussion Cardiac: Irregular rate and rhythm with normal S1 and S2, no S3 or S4.  PMI is normal.  No pathological murmurs Abdomen: Normal bowel sounds, abdomen is soft and nontender without masses, organomegaly or hernias noted.  No hepatosplenomegaly MSK: Back normal, normal gait muscle strength and tone normal Vascular: Pulse is normal in all 4 extremities Extremities: No peripheral pitting edema Neurologic: Alert and oriented x 3 Skin: Intact without lesions or rashes Lymphatics: No significant adenopathy Psychologic: Normal affect   ECG: Not available  ASSESSMENT AND PLAN

## 2010-12-29 NOTE — Assessment & Plan Note (Signed)
No recurrent chest pain. The patient has not required any nitroglycerin. No further indication for ischemia workup.

## 2011-01-17 ENCOUNTER — Telehealth: Payer: Self-pay | Admitting: *Deleted

## 2011-02-07 ENCOUNTER — Other Ambulatory Visit: Payer: Self-pay | Admitting: Cardiology

## 2011-04-04 ENCOUNTER — Other Ambulatory Visit: Payer: Self-pay | Admitting: Cardiology

## 2011-05-13 ENCOUNTER — Other Ambulatory Visit (HOSPITAL_COMMUNITY): Payer: Self-pay | Admitting: Adult Health

## 2011-05-19 ENCOUNTER — Encounter (HOSPITAL_COMMUNITY): Payer: Self-pay | Admitting: Pharmacy Technician

## 2011-05-25 ENCOUNTER — Encounter (HOSPITAL_COMMUNITY): Payer: Self-pay

## 2011-05-31 ENCOUNTER — Encounter (HOSPITAL_COMMUNITY): Payer: Self-pay

## 2011-05-31 ENCOUNTER — Other Ambulatory Visit: Payer: Self-pay

## 2011-05-31 ENCOUNTER — Emergency Department (HOSPITAL_COMMUNITY)
Admission: EM | Admit: 2011-05-31 | Discharge: 2011-05-31 | Disposition: A | Discharge status: Home / Self Care | Payer: Medicare Other | Attending: Emergency Medicine | Admitting: Emergency Medicine

## 2011-05-31 ENCOUNTER — Emergency Department (HOSPITAL_COMMUNITY): Payer: Medicare Other

## 2011-05-31 ENCOUNTER — Ambulatory Visit (HOSPITAL_COMMUNITY)
Admission: RE | Admit: 2011-05-31 | Discharge: 2011-05-31 | Disposition: A | Discharge status: Home / Self Care | Payer: Medicare Other | Source: Ambulatory Visit | Attending: Adult Health | Admitting: Adult Health

## 2011-05-31 DIAGNOSIS — Z7982 Long term (current) use of aspirin: Secondary | ICD-10-CM | POA: Insufficient documentation

## 2011-05-31 DIAGNOSIS — I4891 Unspecified atrial fibrillation: Secondary | ICD-10-CM | POA: Insufficient documentation

## 2011-05-31 DIAGNOSIS — J4489 Other specified chronic obstructive pulmonary disease: Secondary | ICD-10-CM | POA: Insufficient documentation

## 2011-05-31 DIAGNOSIS — R319 Hematuria, unspecified: Secondary | ICD-10-CM | POA: Insufficient documentation

## 2011-05-31 DIAGNOSIS — I509 Heart failure, unspecified: Secondary | ICD-10-CM | POA: Insufficient documentation

## 2011-05-31 DIAGNOSIS — I5032 Chronic diastolic (congestive) heart failure: Secondary | ICD-10-CM | POA: Insufficient documentation

## 2011-05-31 DIAGNOSIS — E279 Disorder of adrenal gland, unspecified: Secondary | ICD-10-CM | POA: Insufficient documentation

## 2011-05-31 DIAGNOSIS — R0602 Shortness of breath: Secondary | ICD-10-CM | POA: Insufficient documentation

## 2011-05-31 DIAGNOSIS — R0789 Other chest pain: Secondary | ICD-10-CM | POA: Insufficient documentation

## 2011-05-31 DIAGNOSIS — Z87891 Personal history of nicotine dependence: Secondary | ICD-10-CM | POA: Insufficient documentation

## 2011-05-31 DIAGNOSIS — J449 Chronic obstructive pulmonary disease, unspecified: Secondary | ICD-10-CM | POA: Insufficient documentation

## 2011-05-31 DIAGNOSIS — R32 Unspecified urinary incontinence: Secondary | ICD-10-CM | POA: Insufficient documentation

## 2011-05-31 DIAGNOSIS — K5730 Diverticulosis of large intestine without perforation or abscess without bleeding: Secondary | ICD-10-CM | POA: Insufficient documentation

## 2011-05-31 DIAGNOSIS — K429 Umbilical hernia without obstruction or gangrene: Secondary | ICD-10-CM | POA: Insufficient documentation

## 2011-05-31 DIAGNOSIS — I499 Cardiac arrhythmia, unspecified: Secondary | ICD-10-CM | POA: Insufficient documentation

## 2011-05-31 HISTORY — DX: Umbilical hernia without obstruction or gangrene: K42.9

## 2011-05-31 LAB — BASIC METABOLIC PANEL
CO2: 32 mEq/L (ref 19–32)
Calcium: 10.5 mg/dL (ref 8.4–10.5)
Creatinine, Ser: 0.73 mg/dL (ref 0.50–1.10)
Glucose, Bld: 85 mg/dL (ref 70–99)
Potassium: 4.6 mEq/L (ref 3.5–5.1)
Sodium: 138 mEq/L (ref 135–145)

## 2011-05-31 LAB — CBC
MCHC: 32.1 g/dL (ref 30.0–36.0)
Platelets: 189 10*3/uL (ref 150–400)
RDW: 14.5 % (ref 11.5–15.5)

## 2011-05-31 LAB — DIFFERENTIAL
Basophils Absolute: 0 10*3/uL (ref 0.0–0.1)
Basophils Relative: 1 % (ref 0–1)
Lymphocytes Relative: 10 % — ABNORMAL LOW (ref 12–46)
Neutro Abs: 5.2 10*3/uL (ref 1.7–7.7)
Neutrophils Relative %: 79 % — ABNORMAL HIGH (ref 43–77)

## 2011-05-31 LAB — CARDIAC PANEL(CRET KIN+CKTOT+MB+TROPI)
CK, MB: 4 ng/mL (ref 0.3–4.0)
Relative Index: INVALID (ref 0.0–2.5)

## 2011-05-31 MED ORDER — IOHEXOL 300 MG/ML  SOLN
125.0000 mL | Freq: Once | INTRAMUSCULAR | Status: AC | PRN
  Administered 2011-05-31: 125 mL via INTRAVENOUS

## 2011-05-31 MED ORDER — SODIUM CHLORIDE 0.9 % IV SOLN: INTRAVENOUS | Status: DC

## 2011-05-31 NOTE — ED Notes (Signed)
Pt refused IV, notified edp

## 2011-05-31 NOTE — ED Notes (Addendum)
Pt reports having a CT scan about 30 mins ago and started having chest tightness and sob afterwards.  Pt reports that the scan was for her bladder.  Pt reports receiving  IV and oral contrast.

## 2011-05-31 NOTE — ED Notes (Signed)
Ambulated the pt at this time pt had no sob or cp at this time  [place] .

## 2011-05-31 NOTE — ED Provider Notes (Signed)
History  Scribed for Nicholes Stairs, MD, the patient was seen in [facility]. The chart was scribed by Gilman Schmidt. The patients care was started at 12:26 PM.   CSN: [id number]  Arrival date & time 05/31/11  1156   First MD Initiated Contact with Patient 05/31/11 1213      Chief Complaint  Patient presents with  . Shortness of Breath  . Chest Pain    (Consider location/radiation/quality/duration/timing/severity/associated sxs/prior treatment) HPI Deborah [name] is a 78 y.o. female who presents to the Emergency Department complaining of shortness of breath and chest tightness. States symptoms presented after CT scan for bladder ~1 hour ago while walking to the car. States that symptoms are currently resolved. Denies any SOB, cough, diaphoresis, or nausea. Pt has never had similar symptoms. Pt reports receiving IV and oral contrast. Notes that scan was done because she had urine come into rectum. Pt is on Pradaxa. There are no other associated symptoms and no other alleviating or aggravating factors. She says she would like to go home.    PCP. Dr. Sherril Croon Cardiology: Dr. Andee Lineman     Past Medical History  Diagnosis Date  . Decreased left ventricular function     EF normalized by Lexiscan 2010: 69%  . Chronic diastolic heart failure   . A-fib     permanent; rate controlled   . COPD (chronic obstructive pulmonary disease)   . Drug intolerance     Flecacinide and Amiodarone   . CHF (congestive heart failure)   . Asthma   . Umbilical hernia     Past Surgical History  Procedure Date  . Radiofrequency catheter ablation     failed  . Right carpel tunnel release   . Cataract extraction   . Tonsillectomy     Family History  Problem Relation Age of Onset  . Coronary artery disease      fhx     History  Substance Use Topics  . Smoking status: Former Smoker    Quit date: 05/23/1990  . Smokeless tobacco: Not on file  . Alcohol Use: No    OB History    Grav Para Term  Preterm Abortions TAB SAB Ect Mult Living                  Review of Systems  Constitutional: Negative for fever.  Respiratory: Positive for chest tightness and shortness of breath.   Gastrointestinal: Negative for nausea, vomiting and diarrhea.  Genitourinary: Negative for dysuria.  All other systems reviewed and are negative.    Allergies  Carbamazepine and Tramadol  Home Medications   Current Outpatient Rx  Name Route Sig Dispense Refill  . ALBUTEROL SULFATE HFA 108 (90 BASE) MCG/ACT IN AERS Inhalation Inhale 2 puffs into the lungs 4 (four) times daily as needed. Shortness of breath    . ASPIRIN 81 MG PO TBEC Oral Take 81 mg by mouth daily.      Marland Kitchen CALTRATE 600+D PLUS 600-400 MG-UNIT PO TABS Oral Take 1 tablet by mouth 2 (two) times daily.      Roslynn Amble OP Both Eyes Place 4 drops into both eyes 4 (four) times daily.      Marland Kitchen DABIGATRAN ETEXILATE MESYLATE 150 MG PO CAPS Oral Take 150 mg by mouth 2 (two) times daily.     Marland Kitchen DILTIAZEM HCL ER COATED BEADS 120 MG PO CP24 Oral Take 120 mg by mouth daily.      Marland Kitchen DOCUSATE SODIUM 50 MG PO CAPS Oral  Take 50 mg by mouth at bedtime.     . ERGOCALCIFEROL 50000 UNITS PO CAPS Oral Take 50,000 Units by mouth once a week. Takes on Tuesdays.    . FUROSEMIDE 40 MG PO TABS Oral Take 20 mg by mouth daily.     Marland Kitchen HYDROCORTISONE VALERATE 0.2 % EX CREA Topical Apply 1 application topically as needed. For itching     . IMDUR 30 MG PO TB24  TAKE 1 TABLET BY MOUTH   ONCE DAILY AS DIRECTED. 30 each 6  . INULIN 2 G PO CHEW Oral Chew 1 tablet by mouth daily.      Marland Kitchen LANOXIN 0.125 MG PO TABS  TAKE ONE TABLET BY MOUTH ONCE DAILY. 30 each 6  . LEVETIRACETAM 250 MG PO TABS Oral Take 125-250 mg by mouth 2 (two) times daily. Takes one tablet in the morning and half a tablet at 3 pm.    . OMEPRAZOLE MAGNESIUM 20 MG PO TBEC Oral Take 20 mg by mouth 3 (three) times a week. Takes on Monday, Wednesday, & Friday    . POTASSIUM CHLORIDE 10 MEQ PO TBCR Oral Take 10 mEq by  mouth daily.      Marland Kitchen PRAVASTATIN SODIUM 40 MG PO TABS Oral Take 40 mg by mouth at bedtime.      Marland Kitchen ALIGN 4 MG PO CAPS Oral Take 1 capsule by mouth daily.        BP 136/89  Pulse 89  Temp(Src) 97.7 F (36.5 C) (Oral)  Resp 22  SpO2 98%  Physical Exam  Constitutional: She is oriented to person, place, and time. She appears well-developed and well-nourished.  Non-toxic appearance. She does not have a sickly appearance.  HENT:  Head: Normocephalic and atraumatic.  Eyes: Conjunctivae, EOM and lids are normal. Pupils are equal, round, and reactive to light. No scleral icterus.  Neck: Trachea normal and normal range of motion. Neck supple.  Cardiovascular: Normal heart sounds.  An irregular rhythm present.  Pulmonary/Chest: Effort normal and breath sounds normal.  Abdominal: Soft. Normal appearance. There is no tenderness. There is no rebound, no guarding and no CVA tenderness.  Musculoskeletal: Normal range of motion.  Neurological: She is alert and oriented to person, place, and time. She has normal strength.  Skin: Skin is warm, dry and intact. No rash noted.    ED Course  Procedures (including critical care time) 78 year old lady, developed, chest tightness and shortness of breath while ambulating to her car following a CAT scan with contrast.  The symptoms began after the CAT scan and have resolved now.  She has atrial fibrillation, but no history of coronary disease, and no risk factors for coronary artery  Disease.  She is asymptomatic now.  We will perform a chest x-ray, and laboratory testing, and monitor.  Her for recurrence of symptoms.  Labs Reviewed - No data to display Ct Abdomen Pelvis W Wo Contrast  05/31/2011  *RADIOLOGY REPORT*  Clinical Data: Incontinence, hematuria  CT ABDOMEN AND PELVIS WITHOUT AND WITH CONTRAST  Technique:  Multidetector CT imaging of the abdomen and pelvis was performed without contrast material in one or both body regions, followed by contrast  material(s) and further sections in one or both body regions.  Contrast: OMNIPAQUE IOHEXOL 300 MG/ML IV SOLN  Comparison: None.  Findings: Within the lung bases, mild right middle lobe scarring is present.  On the precontrast images, atherosclerotic calcification is present, and there are phleboliths within the pelvis.  Otherwise, no abnormal calcifications  are seen overlying the abdomen or pelvis.  The liver, gallbladder, pancreas, adrenal glands, kidneys, urinary bladder, uterus, adnexal regions have a normal appearance.  There are no osseous lesions. Diffuse disc bulges are noted at L4-L5 and L5-S1.  Sigmoid diverticulosis is present without radiographic evidence of diverticulitis.  There is no adenopathy or free fluid within the abdomen or pelvis.  A 7 mm cavernous hemangioma is incidentally noted within the spleen.  IMPRESSION: There is sigmoid diverticulosis without radiographic evidence of diverticulitis.  7 mm splenic cavernous hemangioma.  Original Report Authenticated By: Brandon Melnick, M.D.     No diagnosis found.  DIAGNOSTIC STUDIES: Oxygen Saturation is 98% on room air, normal by my interpretation.     Date: 05/31/2011  Rate: 98  Rhythm: atrial fibrillation  QRS Axis: normal  Intervals: normal  ST/T Wave abnormalities: nonspecific ST/T changes  Conduction Disutrbances:none  Narrative Interpretation:   Old EKG Reviewed: atrial fibrillation with nonspecific sttw changes  LABS Results for orders placed during the hospital encounter of 05/31/11  CBC      Component Value Range   WBC 6.5  4.0 - 10.5 (K/uL)   RBC 4.96  3.87 - 5.11 (MIL/uL)   Hemoglobin 14.1  12.0 - 15.0 (g/dL)   HCT 54.0  98.1 - 19.1 (%)   MCV 88.5  78.0 - 100.0 (fL)   MCH 28.4  26.0 - 34.0 (pg)   MCHC 32.1  30.0 - 36.0 (g/dL)   RDW 47.8  29.5 - 62.1 (%)   Platelets 189  150 - 400 (K/uL)  DIFFERENTIAL      Component Value Range   Neutrophils Relative 79 (*) 43 - 77 (%)   Neutro Abs 5.2  1.7 - 7.7  (K/uL)   Lymphocytes Relative 10 (*) 12 - 46 (%)   Lymphs Abs 0.7  0.7 - 4.0 (K/uL)   Monocytes Relative 9  3 - 12 (%)   Monocytes Absolute 0.6  0.1 - 1.0 (K/uL)   Eosinophils Relative 1  0 - 5 (%)   Eosinophils Absolute 0.1  0.0 - 0.7 (K/uL)   Basophils Relative 1  0 - 1 (%)   Basophils Absolute 0.0  0.0 - 0.1 (K/uL)  BASIC METABOLIC PANEL      Component Value Range   Sodium 138  135 - 145 (mEq/L)   Potassium 4.6  3.5 - 5.1 (mEq/L)   Chloride 101  96 - 112 (mEq/L)   CO2 32  19 - 32 (mEq/L)   Glucose, Bld 85  70 - 99 (mg/dL)   BUN 14  6 - 23 (mg/dL)   Creatinine, Ser 3.08  0.50 - 1.10 (mg/dL)   Calcium 65.7  8.4 - 10.5 (mg/dL)   GFR calc non Af Amer 77 (*) >90 (mL/min)   GFR calc Af Amer 89 (*) >90 (mL/min)  CARDIAC PANEL(CRET KIN+CKTOT+MB+TROPI)      Component Value Range   Total CK 83  7 - 177 (U/L)   CK, MB 4.0  0.3 - 4.0 (ng/mL)   Troponin I <0.30  <0.30 (ng/mL)   Relative Index RELATIVE INDEX IS INVALID  0.0 - 2.5    Radiology: [facility] Chest Port 1 View. Reviewed by me. IMPRESSION: Cardiomegaly. No active disease. Original Report Authenticated By: Cyndie Chime, M.D.   COORDINATION OF CARE: 12:26pm:  - Patient evaluated by ED physician, CBC, Diff, BMP, Cardia Panel, EKG ordered  2:39 PM sxs still resolved. Pt ambulated in ed without recurrence of chest tightness and sob.  MDM  Chest tightness following IV contrast for abdominal CT.  Symptoms resolved.  EKG, and blood tests do not show any signs of cardiac ischemia.  The patient has no risk factors for coronary artery disease.  She was able to ambulate again in the emergency department without recurrence of her symptoms.   I personally performed the services described in this documentation, which was scribed in my presence. The recorded information has been reviewed and considered.       Nicholes Stairs, MD 05/31/11 1440

## 2011-06-06 ENCOUNTER — Other Ambulatory Visit: Payer: Self-pay | Admitting: Cardiology

## 2011-06-10 ENCOUNTER — Ambulatory Visit (INDEPENDENT_AMBULATORY_CARE_PROVIDER_SITE_OTHER): Payer: Medicare Other | Admitting: Urology

## 2011-06-10 DIAGNOSIS — R32 Unspecified urinary incontinence: Secondary | ICD-10-CM

## 2011-06-10 DIAGNOSIS — Z87440 Personal history of urinary (tract) infections: Secondary | ICD-10-CM

## 2011-06-12 ENCOUNTER — Other Ambulatory Visit: Payer: Self-pay

## 2011-06-12 ENCOUNTER — Emergency Department (HOSPITAL_COMMUNITY)
Admission: EM | Admit: 2011-06-12 | Discharge: 2011-06-12 | Disposition: A | Discharge status: Home / Self Care | Payer: Medicare Other | Attending: Emergency Medicine | Admitting: Emergency Medicine

## 2011-06-12 ENCOUNTER — Encounter (HOSPITAL_COMMUNITY): Payer: Self-pay | Admitting: *Deleted

## 2011-06-12 ENCOUNTER — Emergency Department (HOSPITAL_COMMUNITY): Payer: Medicare Other

## 2011-06-12 DIAGNOSIS — J441 Chronic obstructive pulmonary disease with (acute) exacerbation: Secondary | ICD-10-CM

## 2011-06-12 DIAGNOSIS — J449 Chronic obstructive pulmonary disease, unspecified: Secondary | ICD-10-CM | POA: Insufficient documentation

## 2011-06-12 DIAGNOSIS — Z87891 Personal history of nicotine dependence: Secondary | ICD-10-CM | POA: Insufficient documentation

## 2011-06-12 DIAGNOSIS — I509 Heart failure, unspecified: Secondary | ICD-10-CM | POA: Insufficient documentation

## 2011-06-12 DIAGNOSIS — Z9849 Cataract extraction status, unspecified eye: Secondary | ICD-10-CM | POA: Insufficient documentation

## 2011-06-12 DIAGNOSIS — R000 Tachycardia, unspecified: Secondary | ICD-10-CM | POA: Insufficient documentation

## 2011-06-12 DIAGNOSIS — J4489 Other specified chronic obstructive pulmonary disease: Secondary | ICD-10-CM | POA: Insufficient documentation

## 2011-06-12 DIAGNOSIS — R0602 Shortness of breath: Secondary | ICD-10-CM | POA: Insufficient documentation

## 2011-06-12 DIAGNOSIS — Z79899 Other long term (current) drug therapy: Secondary | ICD-10-CM | POA: Insufficient documentation

## 2011-06-12 DIAGNOSIS — K429 Umbilical hernia without obstruction or gangrene: Secondary | ICD-10-CM | POA: Insufficient documentation

## 2011-06-12 DIAGNOSIS — I4891 Unspecified atrial fibrillation: Secondary | ICD-10-CM | POA: Insufficient documentation

## 2011-06-12 LAB — CBC
MCV: 87.5 fL (ref 78.0–100.0)
Platelets: 222 10*3/uL (ref 150–400)
RBC: 5.38 MIL/uL — ABNORMAL HIGH (ref 3.87–5.11)
WBC: 11.9 10*3/uL — ABNORMAL HIGH (ref 4.0–10.5)

## 2011-06-12 LAB — BASIC METABOLIC PANEL
CO2: 29 mEq/L (ref 19–32)
Chloride: 99 mEq/L (ref 96–112)
Creatinine, Ser: 0.65 mg/dL (ref 0.50–1.10)
Potassium: 3.7 mEq/L (ref 3.5–5.1)
Sodium: 138 mEq/L (ref 135–145)

## 2011-06-12 LAB — PRO B NATRIURETIC PEPTIDE: Pro B Natriuretic peptide (BNP): 474.1 pg/mL — ABNORMAL HIGH (ref 0–450)

## 2011-06-12 MED ORDER — MOXIFLOXACIN HCL 400 MG PO TABS: 400.0000 mg | ORAL_TABLET | Freq: Every day | ORAL | Status: AC

## 2011-06-12 MED ORDER — ALBUTEROL SULFATE (5 MG/ML) 0.5% IN NEBU
5.0000 mg | INHALATION_SOLUTION | Freq: Once | RESPIRATORY_TRACT | Status: AC
  Administered 2011-06-12: 5 mg via RESPIRATORY_TRACT
  Filled 2011-06-12: qty 1

## 2011-06-12 MED ORDER — ALBUTEROL SULFATE (5 MG/ML) 0.5% IN NEBU
INHALATION_SOLUTION | RESPIRATORY_TRACT | Status: AC
  Administered 2011-06-12: 5 mg
  Filled 2011-06-12: qty 1

## 2011-06-12 MED ORDER — PREDNISONE 50 MG PO TABS: 50.0000 mg | ORAL_TABLET | Freq: Every day | ORAL | Status: DC

## 2011-06-12 MED ORDER — IPRATROPIUM BROMIDE 0.02 % IN SOLN
0.5000 mg | Freq: Once | RESPIRATORY_TRACT | Status: AC
  Administered 2011-06-12: 0.5 mg via RESPIRATORY_TRACT
  Filled 2011-06-12: qty 2.5

## 2011-06-12 MED ORDER — MOXIFLOXACIN HCL 400 MG PO TABS
400.0000 mg | ORAL_TABLET | Freq: Every day | ORAL | Status: DC
Start: 2011-06-12 — End: 2011-06-12
  Administered 2011-06-12: 400 mg via ORAL
  Filled 2011-06-12: qty 1

## 2011-06-12 MED ORDER — PREDNISONE 20 MG PO TABS
60.0000 mg | ORAL_TABLET | Freq: Once | ORAL | Status: AC
  Administered 2011-06-12: 60 mg via ORAL
  Filled 2011-06-12: qty 3

## 2011-06-12 NOTE — ED Notes (Signed)
Pt c/o wheezing, sob, and cough x 1 wk.

## 2011-06-12 NOTE — ED Provider Notes (Signed)
History     CSN: [id number]  Arrival date & time 06/12/11  0149   First MD Initiated Contact with Patient 06/12/11 0217      Chief Complaint  Patient presents with  . Wheezing  . Cough  . Shortness of Breath    (Consider location/radiation/quality/duration/timing/severity/associated sxs/prior treatment) HPI Patient presenting with wheezing cough and shortness of breath. She has a history of COPD/asthma and has been having increased cough with more sputum production over the past 3-4 days. Her wheezing worsened tonight while she was trying to sleep which prompted ER evaluation. She did not use any inhalers at home. She has not had any recent steroid or antibiotic use. She denies a history of intubation. She denies fevers or chills. She denies any pain in her chest or leg swelling. There no other associated systemic symptoms. There no other alleviating or modifying factors. Symptoms are continuous  Past Medical History  Diagnosis Date  . Decreased left ventricular function     EF normalized by Lexiscan 2010: 69%  . Chronic diastolic heart failure   . A-fib     permanent; rate controlled   . COPD (chronic obstructive pulmonary disease)   . Drug intolerance     Flecacinide and Amiodarone   . CHF (congestive heart failure)   . Asthma   . Umbilical hernia     Past Surgical History  Procedure Date  . Radiofrequency catheter ablation     failed  . Right carpel tunnel release   . Cataract extraction   . Tonsillectomy     Family History  Problem Relation Age of Onset  . Coronary artery disease      fhx     History  Substance Use Topics  . Smoking status: Former Smoker    Quit date: 05/23/1990  . Smokeless tobacco: Not on file  . Alcohol Use: No    OB History    Grav Para Term Preterm Abortions TAB SAB Ect Mult Living                  Review of Systems ROS reviewed and otherwise negative except for mentioned in HPI  Allergies  Omnipaque; Carbamazepine; and  Tramadol  Home Medications   Current Outpatient Rx  Name Route Sig Dispense Refill  . ACETAMINOPHEN 325 MG PO TABS Oral Take 650 mg by mouth every 6 (six) hours as needed. For pain     . ALBUTEROL SULFATE HFA 108 (90 BASE) MCG/ACT IN AERS Inhalation Inhale 2 puffs into the lungs 4 (four) times daily as needed. Shortness of breath    . ASPIRIN 81 MG PO TBEC Oral Take 81 mg by mouth daily.      Marland Kitchen CALTRATE 600+D PLUS 600-400 MG-UNIT PO TABS Oral Take 1 tablet by mouth 2 (two) times daily.      Roslynn Amble OP Both Eyes Place 4 drops into both eyes 4 (four) times daily.      Marland Kitchen CHARCOAL 260 MG PO CAPS Oral Take 1 capsule by mouth 2 (two) times daily.      Marland Kitchen DABIGATRAN ETEXILATE MESYLATE 150 MG PO CAPS Oral Take 150 mg by mouth 2 (two) times daily.     Marland Kitchen DILTIAZEM HCL ER COATED BEADS 120 MG PO CP24 Oral Take 120 mg by mouth daily.      Marland Kitchen DOCUSATE SODIUM 50 MG PO CAPS Oral Take 50 mg by mouth at bedtime.     . ERGOCALCIFEROL 50000 UNITS PO CAPS Oral Take 50,000 Units  by mouth once a week. Takes on Tuesdays.    . FUROSEMIDE 20 MG PO TABS Oral Take 20 mg by mouth daily.      Marland Kitchen HYDROCORTISONE VALERATE 0.2 % EX CREA Topical Apply 1 application topically as needed. For rash     . IMDUR 30 MG PO TB24  TAKE 1 TABLET BY MOUTH ONCE DAILY AS DIRECTED. 30 each 6  . INULIN 2 G PO CHEW Oral Chew 1 tablet by mouth daily.      Marland Kitchen LANOXIN 0.125 MG PO TABS  TAKE ONE TABLET BY MOUTH ONCE DAILY. 30 each 6  . LEVETIRACETAM 250 MG PO TABS Oral Take 125-250 mg by mouth 2 (two) times daily. Takes one tablet in the morning and half a tablet at 3 pm.    . OMEPRAZOLE MAGNESIUM 20 MG PO TBEC Oral Take 20 mg by mouth 3 (three) times a week. Takes on Monday, Wednesday, & Friday    . POTASSIUM CHLORIDE 10 MEQ PO TBCR Oral Take 10 mEq by mouth daily.      Marland Kitchen PRAVACHOL 40 MG PO TABS  TAKE 1 TABLET BY MOUTH AT BEDTIME FOR CHOLESTEROL. 30 each 0    SEND FUTURE REQUEST ON THIS MEDICATION TO PRIMARY  ...  . ALIGN 4 MG PO CAPS Oral  Take 1 capsule by mouth daily.      Marland Kitchen MOXIFLOXACIN HCL 400 MG PO TABS Oral Take 1 tablet (400 mg total) by mouth daily. 10 tablet 0  . PREDNISONE 50 MG PO TABS Oral Take 1 tablet (50 mg total) by mouth daily. 4 tablet 0    BP 120/67  Pulse 110  Temp 98.2 F (36.8 C)  Resp 18  Ht 5\' 2"  (1.575 m)  Wt 135 lb (61.236 kg)  BMI 24.69 kg/m2  SpO2 98% Vitals reviewed- tachycardic after albuterol Physical Exam Physical Examination: General appearance - alert, well appearing, and in no distress Mental status - alert, oriented to person, place, and time Eyes - pupils equal and reactive, no conjunctival injection Mouth - mucous membranes moist, pharynx normal without lesions Chest - bilateral diffuse expiratory wheezing with good air movement, no rales or rhonchi, symmetric air entry Heart - normal rate, regular rhythm, normal S1, S2, no murmurs, rubs, clicks or gallops Abdomen - soft, nontender, nondistended, no masses or organomegaly Musculoskeletal - no joint tenderness, deformity or swelling Extremities - peripheral pulses normal, no pedal edema, no clubbing or cyanosis Skin - normal coloration and turgor, no rashes  ED Course  Procedures (including critical care time)   Date: 06/12/2011  Rate: 119  Rhythm: atrial fibrillation  QRS Axis: normal  Intervals: indeterminate  ST/T Wave abnormalities: ST depressions/t wave inversions in inferior/lateral leads, no significant change from prior  Conduction Disutrbances:none  Narrative Interpretation:   Old EKG Reviewed: unchanged compared to 05/31/11 except increased rate (ekg obtained after receiving albuterol)    Labs Reviewed  CBC - Abnormal; Notable for the following:    WBC 11.9 (*)    RBC 5.38 (*)    Hemoglobin 15.7 (*)    HCT 47.1 (*)    All other components within normal limits  BASIC METABOLIC PANEL - Abnormal; Notable for the following:    Glucose, Bld 139 (*)    Calcium 10.8 (*)    GFR calc non Af Amer 80 (*)    All  other components within normal limits  PRO B NATRIURETIC PEPTIDE - Abnormal; Notable for the following:    Pro B Natriuretic peptide (  BNP) 474.1 (*)    All other components within normal limits   Dg Chest 2 View  06/12/2011  *RADIOLOGY REPORT*  Clinical Data: Cough, chest tightness.  CHEST - 2 VIEW  Comparison: 05/31/2011  Findings: There is hyperinflation of the lungs compatible with COPD.  Mild cardiomegaly.  Lungs are clear.  No effusions. Multiple mid thoracic vertebral body fractures, likely chronic. Diffuse osteopenia.  IMPRESSION: Hyperinflation/COPD.  Cardiomegaly.  Original Report Authenticated By: Cyndie Chime, M.D.   4:54 AM pt reassessed for the second time, wheezing improved and patient requesting discharge.  Has been started on steroids and abx.  Continued wheezing in bilateral lung fields.  No increased work of breathing or hypoxia.  Will give another neb treatment in ED, then proceed with discharge at patient/family request.  They are agreeable with this plan.   1. COPD exacerbation       MDM  Patient presenting with several days of cough productive of sputum with worsening wheezing tonight. She has a history of asthma/COPD. She's had no fever or chills and no chest pain. On her examination she has diffuse wheezing. I suspect her presentation is 2 to a COPD exacerbation. Chest x-ray did not reveal any acute infiltrates. Her EKG showed atrial fibrillation. She was mildly tachycardic which was after albuterol treatments. She improved after 3 albuterol treatments in the ED received steroids and moxifloxacin. She is requesting discharge to home and her family member is in agreement with this plan. She was discharged with a course of steroids and antibiotics and instructed to use her albuterol every 4 hours. She was given strict return precautions and is agreeable with the plan.        Ethelda Chick, MD 06/13/11 [phone]

## 2011-06-30 ENCOUNTER — Ambulatory Visit: Payer: Medicare Other | Admitting: Cardiology

## 2011-07-20 ENCOUNTER — Other Ambulatory Visit: Payer: Self-pay | Admitting: Cardiology

## 2011-08-09 ENCOUNTER — Ambulatory Visit (INDEPENDENT_AMBULATORY_CARE_PROVIDER_SITE_OTHER): Payer: Medicare Other | Admitting: Cardiology

## 2011-08-09 ENCOUNTER — Encounter: Payer: Self-pay | Admitting: Cardiology

## 2011-08-09 VITALS — BP 108/67 | HR 62 | Ht 63.0 in | Wt 143.0 lb

## 2011-08-09 DIAGNOSIS — I2510 Atherosclerotic heart disease of native coronary artery without angina pectoris: Secondary | ICD-10-CM

## 2011-08-09 DIAGNOSIS — J449 Chronic obstructive pulmonary disease, unspecified: Secondary | ICD-10-CM

## 2011-08-09 DIAGNOSIS — I509 Heart failure, unspecified: Secondary | ICD-10-CM

## 2011-08-09 DIAGNOSIS — I519 Heart disease, unspecified: Secondary | ICD-10-CM

## 2011-08-09 NOTE — Patient Instructions (Signed)
Continue all current medications. Your physician wants you to follow up in:  1 year.  You will receive a reminder letter in the mail one-two months in advance.  If you don't receive a letter, please call our office to schedule the follow up appointment   

## 2011-08-10 ENCOUNTER — Encounter: Payer: Self-pay | Admitting: Cardiology

## 2011-08-10 DIAGNOSIS — I509 Heart failure, unspecified: Secondary | ICD-10-CM | POA: Insufficient documentation

## 2011-08-10 DIAGNOSIS — J449 Chronic obstructive pulmonary disease, unspecified: Secondary | ICD-10-CM | POA: Insufficient documentation

## 2011-08-10 DIAGNOSIS — I2510 Atherosclerotic heart disease of native coronary artery without angina pectoris: Secondary | ICD-10-CM | POA: Insufficient documentation

## 2011-08-10 DIAGNOSIS — I519 Heart disease, unspecified: Secondary | ICD-10-CM | POA: Insufficient documentation

## 2011-08-10 NOTE — Progress Notes (Signed)
Deborah Bottoms, MD, [facility] ABIM Board Certified in Adult Cardiovascular Medicine,Internal Medicine and Critical Care Medicine    CC: followup patient with coronary artery disease and permanent atrial fibrillation  HPI:  The patient is an 78 year old female with history of permanent atrial fibrillation, status post bare-metal stent of the circumflex coronary 2009 on chronic anticoagulation with dabigatran.  The patient has been doing well.  She reports no chest pain or shortness of breath orthopnea or PND.  Her renal function is being monitored by her primary care physician and no dose adjustments has been needed in dabigatran. The patient has developed significant macular degeneration and her vision has become a problem.  Fortunately she has had no falls and is still quite steady on her feet she is very careful and walks with a cane. She reports no palpitations and appears that her atrial fibrillation is well rate controlled.  PMH: reviewed and listed in Problem List in Electronic Records (and see below) Past Medical History  Diagnosis Date  . Decreased left ventricular function     EF normalized by Lexiscan 2010: 69%  . Chronic diastolic heart failure   . A-fib     permanent; rate controlled   . COPD (chronic obstructive pulmonary disease)   . Drug intolerance     Flecacinide and Amiodarone   . CHF (congestive heart failure)   . Asthma   . Umbilical hernia   . Coronary artery disease     status post bare-metal stent to the circumflex coronary artery2009  . Chronic anticoagulation     on dabigatran with no complications   Past Surgical History  Procedure Date  . Radiofrequency catheter ablation     failed  . Right carpel tunnel release   . Cataract extraction   . Tonsillectomy     Allergies/SH/FHX : available in Electronic Records for review  Allergies  Allergen Reactions  . Omnipaque (Iohexol) Shortness Of Breath and Other (See Comments)    Short of breath with chest  tightness after IV injection in CT, pt was fine when she left department but developed symptoms in parking lot and went to emergency department.  . Carbamazepine     REACTION: toxemia  . Tramadol Other (See Comments)    Unknown    History   Social History  . Marital Status: Widowed    Spouse Name: N/A    Number of Children: N/A  . Years of Education: N/A   Occupational History  . Not on file.   Social History Main Topics  . Smoking status: Former Smoker -- 0.8 packs/day for 40 years    Types: Cigarettes    Quit date: 05/23/1990  . Smokeless tobacco: Never Used  . Alcohol Use: No  . Drug Use: No  . Sexually Active: Not on file   Other Topics Concern  . Not on file   Social History Narrative   Widowed.    Family History  Problem Relation Age of Onset  . Coronary artery disease      fhx     Medications: Current Outpatient Prescriptions  Medication Sig Dispense Refill  . acetaminophen (TYLENOL) 325 MG tablet Take 650 mg by mouth every 6 (six) hours as needed. For pain       . albuterol (VENTOLIN HFA) 108 (90 BASE) MCG/ACT inhaler Inhale 2 puffs into the lungs 4 (four) times daily as needed. Shortness of breath      . aspirin 81 MG EC tablet Take 81 mg by mouth  daily.        . Calcium Carbonate-Vit D-Min (CALTRATE 600+D PLUS) 600-400 MG-UNIT per tablet Take 1 tablet by mouth 2 (two) times daily.        . Carboxymethylcellulose Sodium (THERATEARS OP) Place 4 drops into both eyes 4 (four) times daily.        . dabigatran (PRADAXA) 150 MG CAPS Take 150 mg by mouth 2 (two) times daily.       Marland Kitchen diltiazem (CARDIZEM CD) 120 MG 24 hr capsule Take 120 mg by mouth daily.        Marland Kitchen docusate sodium (COLACE) 50 MG capsule Take 50 mg by mouth at bedtime.       . ergocalciferol (VITAMIN D2) 50000 UNITS capsule Take 50,000 Units by mouth once a week. Takes on Tuesdays.      . furosemide (LASIX) 20 MG tablet Take 20 mg by mouth daily.        . hydrocortisone valerate cream (WESTCORT) 0.2  % Apply 1 application topically as needed. For rash       . IMDUR 30 MG 24 hr tablet TAKE 1 TABLET BY MOUTH ONCE DAILY AS DIRECTED.  30 each  6  . Inulin (FIBERCHOICE) 2 G CHEW Chew 1 tablet by mouth daily.        Marland Kitchen LANOXIN 0.125 MG tablet TAKE ONE TABLET BY MOUTH ONCE DAILY.  30 each  6  . levETIRAcetam (KEPPRA) 250 MG tablet Takes one tablet in the morning and half a tablet at 3 pm.      . Multiple Vitamins-Minerals (ICAPS) CAPS Take 1 capsule by mouth 2 (two) times daily.      Marland Kitchen omeprazole (PRILOSEC OTC) 20 MG tablet Take 20 mg by mouth 3 (three) times a week. Takes on Monday, Wednesday, & Friday      . potassium chloride (KLOR-CON) 10 MEQ CR tablet Take 10 mEq by mouth daily.        Marland Kitchen PRAVACHOL 40 MG tablet TAKE 1 TABLET BY MOUTH AT BEDTIME FOR CHOLESTEROL.  30 each  0  . Probiotic Product (ALIGN) 4 MG CAPS Take 1 capsule by mouth daily.          ROS: No nausea or vomiting. No fever or chills.No melena or hematochezia.No bleeding.No claudication  Physical Exam: BP 108/67  Pulse 62  Ht 5\' 3"  (1.6 m)  Wt 143 lb (64.864 kg)  BMI 25.33 kg/m2  SpO2 98% General:well-nourished white female in no distress. Neck:normal carotid upstrokes and no carotid bruits.  No thyromegaly nonnodular thyroid.  JVP is 5-6 cm Lungs:clear breath sounds bilaterally with no wheezing Cardiac:regular rate and rhythm with normal S1 and S2.  No murmur rubs or gallops Vascular:no edema.  Normal distal pulses bilaterally Skin:warm and dry Physcologic:normal affect  12lead ECG:not obtained Limited bedside ECHO:N/A No images are attached to the encounter.    Patient Active Problem List  Diagnoses  . DYSLIPIDEMIA  . Permanent atrial fibrillation rate control  . DIASTOLIC HEART FAILURE, CHRONIC-no volume overload  . Chronic anticoagulation-on dabigatran  . Coronary artery disease-stable  . COPD (chronic obstructive pulmonary disease)    PLAN   Patient is actually feeling quite well.  At this point is no  indication for ischemia testing.  Her stent was placed in 2009.  Although she has developed macular degeneration and vision problems I do not think she is at risk for falls to it and he can safely continue dabigatran.  The risk of stroke is quite high.  Otherwise I make no changes in medical therapy.  She can continue on low-dose aspirin in conjunction with dabigatran given her history of coronary artery disease.

## 2011-08-11 ENCOUNTER — Encounter (INDEPENDENT_AMBULATORY_CARE_PROVIDER_SITE_OTHER): Payer: Self-pay | Admitting: Internal Medicine

## 2011-08-11 ENCOUNTER — Ambulatory Visit (INDEPENDENT_AMBULATORY_CARE_PROVIDER_SITE_OTHER): Payer: MEDICARE | Admitting: Internal Medicine

## 2011-08-11 DIAGNOSIS — R195 Other fecal abnormalities: Secondary | ICD-10-CM | POA: Insufficient documentation

## 2011-08-11 NOTE — Consult Note (Signed)
Reason for consultation; positive fecal immunochemical test(FIT).  History of present illness; Deborah [name] is 78-year-old Caucasian female who is referred through courtesy of Dr. [name] for GI evaluation. In January 2013 she had stool test which was positive.(FIT). She had CBC on 06/12/2011 and her H&H was 15.7 and 47.1. Patient is accompanied by her daughter Ms. Deborah Sparks RN.  Patient denies nausea vomiting dysphagia or abdominal pain. She has very good appetite. There is no history of melena or rectal bleeding she is limited because of macular degeneration. In January she did have an episode where she had watery yellowish stool. She was seen by Dr. Wren of urology and underwent cystoscopy. This study was normal.  She was on warfarin until she was switched to Pradaxa last summer because she got tired of having blood drawn every few weeks. She has noted less bruising since she's been on this medication. She is on low-dose aspirin but does not take any OTC NSAIDs.  She has history of upper GI bleed which is reviewed and her past medical history. She believes she had a colonoscopy within the last 4-5 years but so far I have not been able to locate these records on Epic, E-chart or at [facility].  Current medications;  Current Outpatient Prescriptions on File Prior to Visit   Medication  Sig  Dispense  Refill   .  acetaminophen (TYLENOL) 325 MG tablet  Take 650 mg by mouth every 6 (six) hours as needed. For pain     .  albuterol (VENTOLIN HFA) 108 (90 BASE) MCG/ACT inhaler  Inhale 2 puffs into the lungs 4 (four) times daily as needed. Shortness of breath     .  aspirin 81 MG EC tablet  Take 81 mg by mouth daily.     .  Calcium Carbonate-Vit D-Min (CALTRATE 600+D PLUS) 600-400 MG-UNIT per tablet  Take 1 tablet by mouth 2 (two) times daily.     .  Carboxymethylcellulose Sodium (THERATEARS OP)  Place 4 drops into both eyes 4 (four) times daily.     .  dabigatran (PRADAXA) 150 MG CAPS  Take 150 mg by mouth 2 (two)  times daily.     .  diltiazem (CARDIZEM CD) 120 MG 24 hr capsule  Take 120 mg by mouth daily.     .  ergocalciferol (VITAMIN D2) 50000 UNITS capsule  Take 50,000 Units by mouth once a week. Takes on Tuesdays.     .  furosemide (LASIX) 20 MG tablet  Take 20 mg by mouth daily.     .  hydrocortisone valerate cream (WESTCORT) 0.2 %  Apply 1 application topically as needed. For rash     .  IMDUR 30 MG 24 hr tablet  TAKE 1 TABLET BY MOUTH ONCE DAILY AS DIRECTED.  30 each  6   .  Inulin (FIBERCHOICE) 2 G CHEW  Chew 1 tablet by mouth daily.     .  LANOXIN 0.125 MG tablet  TAKE ONE TABLET BY MOUTH ONCE DAILY.  30 each  6   .  levETIRAcetam (KEPPRA) 250 MG tablet  Takes one tablet in the morning and half a tablet at 3 pm.     .  Multiple Vitamins-Minerals (ICAPS) CAPS  Take 1 capsule by mouth 2 (two) times daily.     .  omeprazole (PRILOSEC OTC) 20 MG tablet  Take 20 mg by mouth 3 (three) times a week. Takes on Monday, Wednesday, & Friday     .    potassium chloride (KLOR-CON) 10 MEQ CR tablet  Take 10 mEq by mouth daily.     .  PRAVACHOL 40 MG tablet  TAKE 1 TABLET BY MOUTH AT BEDTIME FOR CHOLESTEROL.  30 each  0   .  Probiotic Product (ALIGN) 4 MG CAPS  Take 1 capsule by mouth daily.      Past medical history;  Seizure disorder diagnosed at age 16 or 17. She was on Tegretol for several years until she developed electrolyte abnormalities back in 2000 and switched to another anticonvulsant.  Chronic atrial fibrillation diagnosed in 2000. Radiofrequency ablation attempted a few years ago or so but was not successful as of lack of access to site.  Bronchial asthma.  COPD.  2 unit upper GI bleed in May 2005 secondary to gastric Dieulafoy lesion treated with combination of injection and coagulation therapy(Dr. Valinda Fedie).  GERD.  Coronary artery disease. Status post bare-metal stents to left circumflex artery.  History of diastolic heart failure.  She has macular degeneration and is legally blind.  She had  tonsillectomy several years ago.  Decompression for right carpal tunnel several years ago.  Bilateral cataract extraction.  Allergies;  Omnipaque, carbamazepine and tramadol.  Family history;  Both parents suffered MI in their 70s.  She has 4 sisters and one brother in fair to good health.  Social history;  She is widowed. She lives alone. She does not drive.  She has help twice a week that she can go grocery shopping etc. her daughter also helps her as much as she can. Patient does not drink alcohol or smoke cigarettes.  Physical exam;  BP 126/64  Pulse 60  Temp 96.9 F (36.1 C)  Ht 5' 3" (1.6 m)  Wt 142 lb 14.4 oz (64.819 kg)  BMI 25.31 kg/m2  Conjunctival is pink. Sclerae nonicteric..  Oropharyngeal mucosa is normal.  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 No adenopathy or thyromegaly noted.  Cardiac exam with a regular rhythm normal S1 and S2. No murmur or gallop noted.  Lungs are clear to auscultation.  Abdomen is symmetrical. Bowel sounds are normal. No bruits noted. She has small unlikely hernia. Abdomen is soft and nontender without organomegaly or masses. Rectal examination reveals guaiac-negative stool.  No peripheral edema clubbing or Koilonychia noted.  Assessment;  Deborah [name] is 78-year-old occasion female who had positive fecal occult test. Her H&H has remained normal. Her heartburn is well controlled with low-dose PPI and she has no GI symptoms. Past history is significant for upper GI bleed secondary to gastric Dieulafoy lesion. Patient apparently has had colonoscopy within the last 4 years.  FIT is thought to be more sensitive for colonic GI bleed as it only detects human globin. It is possible that Deborah [name] has NSAIDs injury to her colon or she could have AV malformations involving her colon resulting in a positive test.  It is reassuring to note that she has no GI symptoms and her Hemoccult today is negative.    Recommendations;  Retrieve last colonoscopy records to make sure that she had an exam in the last 3-4 years.  CBC today and if normal will be repeated in 3 months.  Patient advised to call if she has rectal bleeding or melena.  We appreciate the opportunity to participate in Deborah [name],s care.  

## 2011-08-11 NOTE — Patient Instructions (Signed)
Notify if if you have melena or rectal bleeding.

## 2011-08-12 LAB — CBC
HCT: 47.1 % — ABNORMAL HIGH (ref 36.0–46.0)
Hemoglobin: 14.7 g/dL (ref 12.0–15.0)
RBC: 5.31 MIL/uL — ABNORMAL HIGH (ref 3.87–5.11)
WBC: 5.5 10*3/uL (ref 4.0–10.5)

## 2011-08-15 ENCOUNTER — Telehealth (INDEPENDENT_AMBULATORY_CARE_PROVIDER_SITE_OTHER): Payer: Self-pay | Admitting: *Deleted

## 2011-08-15 DIAGNOSIS — K922 Gastrointestinal hemorrhage, unspecified: Secondary | ICD-10-CM

## 2011-08-15 NOTE — Telephone Encounter (Signed)
Per Dr. Karilyn Cota the patient is to have CBC/D in 3 months. Lab noted and will notify patient as the time gets closer.

## 2011-08-16 NOTE — Progress Notes (Signed)
Reason for consultation; positive fecal immunochemical test(FIT).  History of present illness; [name] is 78 year old Caucasian female who is referred through courtesy of Dr. Sherril Croon for GI evaluation. In January 2013 she had stool test which was positive.(FIT). She had CBC on 06/12/2011 and her H&H was 15.7 and 47.1. Patient is accompanied by her daughter [name] RN.  Patient denies nausea vomiting dysphagia or abdominal pain. She has very good appetite. There is no history of melena or rectal bleeding she is limited because of macular degeneration. In January she did have an episode where she had watery yellowish stool. She was seen by Dr. Wilson Singer of urology and underwent cystoscopy. This study was normal.  She was on warfarin until she was switched to Pradaxa last summer because she got tired of having blood drawn every few weeks. She has noted less bruising since she's been on this medication. She is on low-dose aspirin but does not take any OTC NSAIDs.  She has history of upper GI bleed which is reviewed and her past medical history. She believes she had a colonoscopy within the last 4-5 years but so far I have not been able to locate these records on Epic, E-chart or at [facility].  Current medications;  Current Outpatient Prescriptions on File Prior to Visit   Medication  Sig  Dispense  Refill   .  acetaminophen (TYLENOL) 325 MG tablet  Take 650 mg by mouth every 6 (six) hours as needed. For pain     .  albuterol (VENTOLIN HFA) 108 (90 BASE) MCG/ACT inhaler  Inhale 2 puffs into the lungs 4 (four) times daily as needed. Shortness of breath     .  aspirin 81 MG EC tablet  Take 81 mg by mouth daily.     .  Calcium Carbonate-Vit D-Min (CALTRATE 600+D PLUS) 600-400 MG-UNIT per tablet  Take 1 tablet by mouth 2 (two) times daily.     .  Carboxymethylcellulose Sodium (THERATEARS OP)  Place 4 drops into both eyes 4 (four) times daily.     .  dabigatran (PRADAXA) 150 MG CAPS  Take 150 mg by mouth 2 (two)  times daily.     Marland Kitchen  diltiazem (CARDIZEM CD) 120 MG 24 hr capsule  Take 120 mg by mouth daily.     .  ergocalciferol (VITAMIN D2) 50000 UNITS capsule  Take 50,000 Units by mouth once a week. Takes on Tuesdays.     .  furosemide (LASIX) 20 MG tablet  Take 20 mg by mouth daily.     .  hydrocortisone valerate cream (WESTCORT) 0.2 %  Apply 1 application topically as needed. For rash     .  IMDUR 30 MG 24 hr tablet  TAKE 1 TABLET BY MOUTH ONCE DAILY AS DIRECTED.  30 each  6   .  Inulin (FIBERCHOICE) 2 G CHEW  Chew 1 tablet by mouth daily.     Marland Kitchen  LANOXIN 0.125 MG tablet  TAKE ONE TABLET BY MOUTH ONCE DAILY.  30 each  6   .  levETIRAcetam (KEPPRA) 250 MG tablet  Takes one tablet in the morning and half a tablet at 3 pm.     .  Multiple Vitamins-Minerals (ICAPS) CAPS  Take 1 capsule by mouth 2 (two) times daily.     Marland Kitchen  omeprazole (PRILOSEC OTC) 20 MG tablet  Take 20 mg by mouth 3 (three) times a week. Takes on Monday, Wednesday, & Friday     .  potassium chloride (KLOR-CON) 10 MEQ CR tablet  Take 10 mEq by mouth daily.     Marland Kitchen  PRAVACHOL 40 MG tablet  TAKE 1 TABLET BY MOUTH AT BEDTIME FOR CHOLESTEROL.  30 each  0   .  Probiotic Product (ALIGN) 4 MG CAPS  Take 1 capsule by mouth daily.      Past medical history;  Seizure disorder diagnosed at age 78 or 78. She was on Tegretol for several years until she developed electrolyte abnormalities back in 2000 and switched to another anticonvulsant.  Chronic atrial fibrillation diagnosed in 2000. Radiofrequency ablation attempted a few years ago or so but was not successful as of lack of access to site.  Bronchial asthma.  COPD.  2 unit upper GI bleed in May 2005 secondary to gastric Dieulafoy lesion treated with combination of injection and coagulation therapy(Dr. Karilyn Cota).  GERD.  Coronary artery disease. Status post bare-metal stents to left circumflex artery.  History of diastolic heart failure.  She has macular degeneration and is legally blind.  She had  tonsillectomy several years ago.  Decompression for right carpal tunnel several years ago.  Bilateral cataract extraction.  Allergies;  Omnipaque, carbamazepine and tramadol.  Family history;  Both parents suffered MI in their 27s.  She has 4 sisters and one brother in fair to good health.  Social history;  She is widowed. She lives alone. She does not drive.  She has help twice a week that she can go grocery shopping etc. her daughter also helps her as much as she can. Patient does not drink alcohol or smoke cigarettes.  Physical exam;  BP 126/64  Pulse 60  Temp 96.9 F (36.1 C)  Ht 5\' 3"  (1.6 m)  Wt 142 lb 14.4 oz (64.819 kg)  BMI 25.31 kg/m2  Conjunctival is pink. Sclerae nonicteric.Marland Kitchen  Oropharyngeal mucosa is normal.  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 No adenopathy or thyromegaly noted.  Cardiac exam with a regular rhythm normal S1 and S2. No murmur or gallop noted.  Lungs are clear to auscultation.  Abdomen is symmetrical. Bowel sounds are normal. No bruits noted. She has small unlikely hernia. Abdomen is soft and nontender without organomegaly or masses. Rectal examination reveals guaiac-negative stool.  No peripheral edema clubbing or Koilonychia noted.  Assessment;  [name] is 78 year old occasion occasion female who had positive fecal occult test. Her H&H has remained normal. Her heartburn is well controlled with low-dose PPI and she has no GI symptoms. Past history is significant for upper GI bleed secondary to gastric Dieulafoy lesion. Patient apparently has had colonoscopy within the last 4 years.  FIT is thought to be more sensitive for colonic GI bleed as it only detects human globin. It is possible that [name] has NSAIDs injury to her colon or she could have AV malformations involving her colon resulting in a positive test.  It is reassuring to note that she has no GI symptoms and her Hemoccult today is negative.    Recommendations;  Retrieve last colonoscopy records to make sure that she had an exam in the last 3-4 years.  CBC today and if normal will be repeated in 3 months.  Patient advised to call if she has rectal bleeding or melena.  We appreciate the opportunity to participate in [name],s care.

## 2011-08-22 NOTE — Telephone Encounter (Signed)
a 

## 2011-10-21 ENCOUNTER — Other Ambulatory Visit (INDEPENDENT_AMBULATORY_CARE_PROVIDER_SITE_OTHER): Payer: Self-pay | Admitting: *Deleted

## 2011-10-21 ENCOUNTER — Encounter (INDEPENDENT_AMBULATORY_CARE_PROVIDER_SITE_OTHER): Payer: Self-pay | Admitting: *Deleted

## 2011-10-21 DIAGNOSIS — K922 Gastrointestinal hemorrhage, unspecified: Secondary | ICD-10-CM

## 2011-11-01 LAB — CBC WITH DIFFERENTIAL/PLATELET
Eosinophils Absolute: 0 10*3/uL (ref 0.0–0.7)
Eosinophils Relative: 1 % (ref 0–5)
HCT: 45.3 % (ref 36.0–46.0)
Hemoglobin: 15.1 g/dL — ABNORMAL HIGH (ref 12.0–15.0)
Lymphocytes Relative: 12 % (ref 12–46)
Lymphs Abs: 0.7 10*3/uL (ref 0.7–4.0)
MCH: 28.1 pg (ref 26.0–34.0)
MCV: 84.4 fL (ref 78.0–100.0)
Monocytes Relative: 8 % (ref 3–12)
RBC: 5.37 MIL/uL — ABNORMAL HIGH (ref 3.87–5.11)
WBC: 6.2 10*3/uL (ref 4.0–10.5)

## 2011-11-11 ENCOUNTER — Encounter (INDEPENDENT_AMBULATORY_CARE_PROVIDER_SITE_OTHER): Payer: Self-pay

## 2012-01-10 ENCOUNTER — Other Ambulatory Visit: Payer: Self-pay | Admitting: *Deleted

## 2012-01-10 MED ORDER — ISOSORBIDE MONONITRATE ER 30 MG PO TB24: 30.0000 mg | ORAL_TABLET | Freq: Every day | ORAL | Status: DC

## 2012-01-19 ENCOUNTER — Other Ambulatory Visit: Payer: Self-pay | Admitting: Cardiology

## 2012-04-21 ENCOUNTER — Inpatient Hospital Stay (HOSPITAL_COMMUNITY)
Admission: EM | Admit: 2012-04-21 | Discharge: 2012-05-04 | DRG: 535 | Disposition: A | Discharge status: Skilled Nursing Facility | Payer: Medicare Other | Attending: Internal Medicine | Admitting: Internal Medicine

## 2012-04-21 ENCOUNTER — Encounter (HOSPITAL_COMMUNITY): Payer: Self-pay | Admitting: Emergency Medicine

## 2012-04-21 ENCOUNTER — Emergency Department (HOSPITAL_COMMUNITY): Payer: Medicare Other

## 2012-04-21 DIAGNOSIS — E785 Hyperlipidemia, unspecified: Secondary | ICD-10-CM

## 2012-04-21 DIAGNOSIS — Z7901 Long term (current) use of anticoagulants: Secondary | ICD-10-CM

## 2012-04-21 DIAGNOSIS — S32599A Other specified fracture of unspecified pubis, initial encounter for closed fracture: Secondary | ICD-10-CM

## 2012-04-21 DIAGNOSIS — Z9861 Coronary angioplasty status: Secondary | ICD-10-CM

## 2012-04-21 DIAGNOSIS — H3530 Unspecified macular degeneration: Secondary | ICD-10-CM

## 2012-04-21 DIAGNOSIS — D6959 Other secondary thrombocytopenia: Secondary | ICD-10-CM | POA: Diagnosis present

## 2012-04-21 DIAGNOSIS — D62 Acute posthemorrhagic anemia: Secondary | ICD-10-CM

## 2012-04-21 DIAGNOSIS — S36892A Contusion of other intra-abdominal organs, initial encounter: Secondary | ICD-10-CM

## 2012-04-21 DIAGNOSIS — F329 Major depressive disorder, single episode, unspecified: Secondary | ICD-10-CM | POA: Diagnosis present

## 2012-04-21 DIAGNOSIS — D696 Thrombocytopenia, unspecified: Secondary | ICD-10-CM

## 2012-04-21 DIAGNOSIS — S32509A Unspecified fracture of unspecified pubis, initial encounter for closed fracture: Principal | ICD-10-CM | POA: Diagnosis present

## 2012-04-21 DIAGNOSIS — F3289 Other specified depressive episodes: Secondary | ICD-10-CM | POA: Diagnosis present

## 2012-04-21 DIAGNOSIS — Z79899 Other long term (current) drug therapy: Secondary | ICD-10-CM

## 2012-04-21 DIAGNOSIS — K5900 Constipation, unspecified: Secondary | ICD-10-CM | POA: Diagnosis present

## 2012-04-21 DIAGNOSIS — Z8249 Family history of ischemic heart disease and other diseases of the circulatory system: Secondary | ICD-10-CM

## 2012-04-21 DIAGNOSIS — R55 Syncope and collapse: Secondary | ICD-10-CM

## 2012-04-21 DIAGNOSIS — J449 Chronic obstructive pulmonary disease, unspecified: Secondary | ICD-10-CM

## 2012-04-21 DIAGNOSIS — I509 Heart failure, unspecified: Secondary | ICD-10-CM | POA: Diagnosis present

## 2012-04-21 DIAGNOSIS — Z7982 Long term (current) use of aspirin: Secondary | ICD-10-CM

## 2012-04-21 DIAGNOSIS — R578 Other shock: Secondary | ICD-10-CM

## 2012-04-21 DIAGNOSIS — T794XXA Traumatic shock, initial encounter: Secondary | ICD-10-CM | POA: Diagnosis not present

## 2012-04-21 DIAGNOSIS — Z87891 Personal history of nicotine dependence: Secondary | ICD-10-CM

## 2012-04-21 DIAGNOSIS — I5033 Acute on chronic diastolic (congestive) heart failure: Secondary | ICD-10-CM | POA: Diagnosis present

## 2012-04-21 DIAGNOSIS — I5032 Chronic diastolic (congestive) heart failure: Secondary | ICD-10-CM

## 2012-04-21 DIAGNOSIS — W010XXA Fall on same level from slipping, tripping and stumbling without subsequent striking against object, initial encounter: Secondary | ICD-10-CM | POA: Diagnosis present

## 2012-04-21 DIAGNOSIS — Z66 Do not resuscitate: Secondary | ICD-10-CM | POA: Diagnosis present

## 2012-04-21 DIAGNOSIS — R7309 Other abnormal glucose: Secondary | ICD-10-CM | POA: Diagnosis present

## 2012-04-21 DIAGNOSIS — J4489 Other specified chronic obstructive pulmonary disease: Secondary | ICD-10-CM | POA: Diagnosis present

## 2012-04-21 DIAGNOSIS — I4821 Permanent atrial fibrillation: Secondary | ICD-10-CM

## 2012-04-21 DIAGNOSIS — I4891 Unspecified atrial fibrillation: Secondary | ICD-10-CM

## 2012-04-21 DIAGNOSIS — W19XXXA Unspecified fall, initial encounter: Secondary | ICD-10-CM

## 2012-04-21 DIAGNOSIS — R1031 Right lower quadrant pain: Secondary | ICD-10-CM

## 2012-04-21 DIAGNOSIS — S36899A Unspecified injury of other intra-abdominal organs, initial encounter: Secondary | ICD-10-CM | POA: Diagnosis present

## 2012-04-21 DIAGNOSIS — I2510 Atherosclerotic heart disease of native coronary artery without angina pectoris: Secondary | ICD-10-CM

## 2012-04-21 DIAGNOSIS — Y92009 Unspecified place in unspecified non-institutional (private) residence as the place of occurrence of the external cause: Secondary | ICD-10-CM

## 2012-04-21 DIAGNOSIS — I959 Hypotension, unspecified: Secondary | ICD-10-CM

## 2012-04-21 DIAGNOSIS — S32519A Fracture of superior rim of unspecified pubis, initial encounter for closed fracture: Secondary | ICD-10-CM | POA: Insufficient documentation

## 2012-04-21 DIAGNOSIS — R509 Fever, unspecified: Secondary | ICD-10-CM

## 2012-04-21 HISTORY — DX: Unspecified atrial fibrillation: I48.91

## 2012-04-21 HISTORY — DX: Atherosclerotic heart disease of native coronary artery without angina pectoris: I25.10

## 2012-04-21 HISTORY — DX: Unspecified convulsions: R56.9

## 2012-04-21 LAB — HEPATIC FUNCTION PANEL
Alkaline Phosphatase: 64 U/L (ref 39–117)
Bilirubin, Direct: <0.1 mg/dL (ref 0.0–0.3)
Total Bilirubin: 0.2 mg/dL — ABNORMAL LOW (ref 0.3–1.2)

## 2012-04-21 LAB — BASIC METABOLIC PANEL
BUN: 14 mg/dL (ref 6–23)
CO2: 30 mEq/L (ref 19–32)
Calcium: 9.2 mg/dL (ref 8.4–10.5)
Creatinine, Ser: 0.68 mg/dL (ref 0.50–1.10)
Glucose, Bld: 137 mg/dL — ABNORMAL HIGH (ref 70–99)

## 2012-04-21 LAB — CBC WITH DIFFERENTIAL/PLATELET
Basophils Absolute: 0 10*3/uL (ref 0.0–0.1)
Eosinophils Relative: 0 % (ref 0–5)
HCT: 41.3 % (ref 36.0–46.0)
Lymphocytes Relative: 6 % — ABNORMAL LOW (ref 12–46)
Lymphs Abs: 0.6 10*3/uL — ABNORMAL LOW (ref 0.7–4.0)
MCV: 88.4 fL (ref 78.0–100.0)
Monocytes Absolute: 0.6 10*3/uL (ref 0.1–1.0)
RDW: 14.6 % (ref 11.5–15.5)
WBC: 9.9 10*3/uL (ref 4.0–10.5)

## 2012-04-21 MED ORDER — POLYVINYL ALCOHOL 1.4 % OP SOLN
1.0000 [drp] | Freq: Four times a day (QID) | OPHTHALMIC | Status: DC
  Administered 2012-04-22 – 2012-05-04 (×48): 1 [drp] via OPHTHALMIC
  Filled 2012-04-21 (×2): qty 15

## 2012-04-21 MED ORDER — ONDANSETRON HCL 4 MG PO TABS: 4.0000 mg | ORAL_TABLET | Freq: Four times a day (QID) | ORAL | Status: DC | PRN

## 2012-04-21 MED ORDER — CARBOXYMETHYLCELLULOSE SODIUM 0.25 % OP SOLN: 4.0000 [drp] | Freq: Four times a day (QID) | OPHTHALMIC | Status: DC

## 2012-04-21 MED ORDER — BISACODYL 5 MG PO TBEC
5.0000 mg | DELAYED_RELEASE_TABLET | Freq: Every day | ORAL | Status: DC | PRN
  Administered 2012-04-30: 5 mg via ORAL
  Filled 2012-04-21: qty 1

## 2012-04-21 MED ORDER — ACETAMINOPHEN 325 MG PO TABS
650.0000 mg | ORAL_TABLET | ORAL | Status: DC | PRN
  Administered 2012-04-22 (×2): 650 mg via ORAL
  Filled 2012-04-21 (×2): qty 2

## 2012-04-21 MED ORDER — SODIUM CHLORIDE 0.9 % IV SOLN: INTRAVENOUS | Status: DC

## 2012-04-21 MED ORDER — POTASSIUM CHLORIDE CRYS ER 20 MEQ PO TBCR
40.0000 meq | EXTENDED_RELEASE_TABLET | Freq: Once | ORAL | Status: AC
  Administered 2012-04-21: 40 meq via ORAL
  Filled 2012-04-21: qty 2

## 2012-04-21 MED ORDER — ONDANSETRON 4 MG PO TBDP
4.0000 mg | ORAL_TABLET | Freq: Once | ORAL | Status: AC
  Administered 2012-04-21: 4 mg via ORAL
  Filled 2012-04-21: qty 1

## 2012-04-21 MED ORDER — RIVAROXABAN 10 MG PO TABS
20.0000 mg | ORAL_TABLET | Freq: Every day | ORAL | Status: DC
  Administered 2012-04-22: 20 mg via ORAL
  Filled 2012-04-21: qty 2

## 2012-04-21 MED ORDER — POLYETHYLENE GLYCOL 3350 17 G PO PACK
17.0000 g | PACK | Freq: Every day | ORAL | Status: DC | PRN
  Administered 2012-04-22: 17 g via ORAL
  Filled 2012-04-21: qty 1

## 2012-04-21 MED ORDER — SIMVASTATIN 20 MG PO TABS
20.0000 mg | ORAL_TABLET | Freq: Every day | ORAL | Status: DC
  Administered 2012-04-22 – 2012-05-02 (×11): 20 mg via ORAL
  Filled 2012-04-21 (×11): qty 1

## 2012-04-21 MED ORDER — LEVETIRACETAM 250 MG PO TABS
125.0000 mg | ORAL_TABLET | Freq: Every evening | ORAL | Status: DC
  Administered 2012-04-22 – 2012-05-03 (×11): 125 mg via ORAL
  Filled 2012-04-21 (×17): qty 0.5

## 2012-04-21 MED ORDER — OXYCODONE HCL 5 MG PO TABS
5.0000 mg | ORAL_TABLET | ORAL | Status: DC | PRN
  Administered 2012-04-23 – 2012-05-03 (×9): 5 mg via ORAL
  Filled 2012-04-21 (×10): qty 1

## 2012-04-21 MED ORDER — ONDANSETRON HCL 4 MG/2ML IJ SOLN
4.0000 mg | INTRAMUSCULAR | Status: DC | PRN
  Administered 2012-04-22 – 2012-04-30 (×2): 4 mg via INTRAVENOUS
  Filled 2012-04-21 (×2): qty 2

## 2012-04-21 MED ORDER — DILTIAZEM HCL ER COATED BEADS 120 MG PO CP24: 120.0000 mg | ORAL_CAPSULE | Freq: Every day | ORAL | Status: DC

## 2012-04-21 MED ORDER — FUROSEMIDE 20 MG PO TABS: 20.0000 mg | ORAL_TABLET | Freq: Every day | ORAL | Status: DC

## 2012-04-21 MED ORDER — HYDROMORPHONE HCL PF 1 MG/ML IJ SOLN
0.5000 mg | INTRAMUSCULAR | Status: DC | PRN
  Administered 2012-04-21: 0.5 mg via INTRAVENOUS
  Filled 2012-04-21: qty 1

## 2012-04-21 MED ORDER — LEVETIRACETAM 500 MG PO TABS
250.0000 mg | ORAL_TABLET | Freq: Every day | ORAL | Status: DC
  Administered 2012-04-22 – 2012-05-04 (×13): 250 mg via ORAL
  Filled 2012-04-21 (×15): qty 1

## 2012-04-21 MED ORDER — DIGOXIN 125 MCG PO TABS
0.1250 mg | ORAL_TABLET | Freq: Every day | ORAL | Status: DC
  Administered 2012-04-22 – 2012-04-27 (×6): 0.125 mg via ORAL
  Filled 2012-04-21 (×6): qty 1

## 2012-04-21 MED ORDER — PANTOPRAZOLE SODIUM 40 MG PO TBEC
40.0000 mg | DELAYED_RELEASE_TABLET | ORAL | Status: DC
  Administered 2012-04-23 – 2012-05-04 (×6): 40 mg via ORAL
  Filled 2012-04-21 (×6): qty 1

## 2012-04-21 MED ORDER — ASPIRIN 81 MG PO TBEC: 81.0000 mg | DELAYED_RELEASE_TABLET | Freq: Every day | ORAL | Status: DC

## 2012-04-21 MED ORDER — ONDANSETRON HCL 4 MG/2ML IJ SOLN
4.0000 mg | Freq: Three times a day (TID) | INTRAMUSCULAR | Status: DC | PRN
  Administered 2012-04-21: 4 mg via INTRAVENOUS
  Filled 2012-04-21: qty 2

## 2012-04-21 MED ORDER — HYDROMORPHONE HCL PF 1 MG/ML IJ SOLN: 0.5000 mg | INTRAMUSCULAR | Status: DC | PRN

## 2012-04-21 MED ORDER — SENNA 8.6 MG PO TABS
1.0000 | ORAL_TABLET | Freq: Two times a day (BID) | ORAL | Status: DC
  Administered 2012-04-22 – 2012-04-26 (×9): 8.6 mg via ORAL
  Filled 2012-04-21 (×9): qty 1

## 2012-04-21 MED ORDER — ISOSORBIDE MONONITRATE ER 60 MG PO TB24: 30.0000 mg | ORAL_TABLET | Freq: Every day | ORAL | Status: DC

## 2012-04-21 MED ORDER — ALBUTEROL SULFATE HFA 108 (90 BASE) MCG/ACT IN AERS
2.0000 | INHALATION_SPRAY | RESPIRATORY_TRACT | Status: DC | PRN
  Administered 2012-05-01 – 2012-05-03 (×2): 2 via RESPIRATORY_TRACT
  Filled 2012-04-21: qty 6.7

## 2012-04-21 MED ORDER — TRAZODONE HCL 50 MG PO TABS
25.0000 mg | ORAL_TABLET | Freq: Every evening | ORAL | Status: DC | PRN
  Administered 2012-04-22 – 2012-05-01 (×3): 25 mg via ORAL
  Filled 2012-04-21 (×3): qty 1

## 2012-04-21 MED ORDER — POTASSIUM CHLORIDE CRYS ER 20 MEQ PO TBCR
20.0000 meq | EXTENDED_RELEASE_TABLET | Freq: Every day | ORAL | Status: DC
  Administered 2012-04-22 – 2012-04-25 (×4): 20 meq via ORAL
  Filled 2012-04-21 (×4): qty 1

## 2012-04-21 MED ORDER — POTASSIUM CHLORIDE IN NACL 20-0.9 MEQ/L-% IV SOLN
INTRAVENOUS | Status: AC
  Administered 2012-04-21: 1000 mL via INTRAVENOUS
  Filled 2012-04-21: qty 1000

## 2012-04-21 MED ORDER — OMEPRAZOLE MAGNESIUM 20 MG PO TBEC: 20.0000 mg | DELAYED_RELEASE_TABLET | ORAL | Status: DC

## 2012-04-21 MED ORDER — ASPIRIN EC 81 MG PO TBEC
81.0000 mg | DELAYED_RELEASE_TABLET | Freq: Every day | ORAL | Status: DC
  Administered 2012-04-22: 81 mg via ORAL
  Filled 2012-04-21: qty 1

## 2012-04-21 MED ORDER — HYDROMORPHONE HCL PF 1 MG/ML IJ SOLN
0.5000 mg | Freq: Once | INTRAMUSCULAR | Status: AC
  Administered 2012-04-21: 0.5 mg via INTRAMUSCULAR
  Filled 2012-04-21: qty 1

## 2012-04-21 NOTE — ED Provider Notes (Signed)
History   This chart was scribed for Jones Skene, MD, by Frederik Pear, ER scribe. The patient was seen in room APA17/APA17 and the patient's care was started at 1738.    CSN: [id number]  Arrival date & time 04/21/12  1722   First MD Initiated Contact with Patient 04/21/12 1738      Chief Complaint  Patient presents with  . Groin Pain  . Hip Pain    (Consider location/radiation/quality/duration/timing/severity/associated sxs/prior treatment) HPI Deborah [name] is a 78 y.o. female who presents to the Emergency Department complaining of 6/10, achy left hip pain that radiates to her groin and abdomen and began PTA after she tripped on a cord while walking and landed on her tail bone. She denies hitting her head or any LOC. She denies any associated chest pain, SOB, fever, chills, coughing, or rhinorrhea. She reports that she does not take any pain medication at home except for Tylenol occasionally. She has a h/o of atrial fibulation.   Past Medical History  Diagnosis Date  . Decreased left ventricular function     EF normalized by Lexiscan 2010: 69%  . Chronic diastolic heart failure   . A-fib     permanent; rate controlled   . COPD (chronic obstructive pulmonary disease)   . Drug intolerance     Flecacinide and Amiodarone   . CHF (congestive heart failure)   . Asthma   . Umbilical hernia   . Coronary artery disease     status post bare-metal stent to the circumflex coronary artery2009  . Chronic anticoagulation     on dabigatran with no complications    Past Surgical History  Procedure Date  . Radiofrequency catheter ablation     failed  . Right carpel tunnel release   . Cataract extraction   . Tonsillectomy     Family History  Problem Relation Age of Onset  . Coronary artery disease      fhx     History  Substance Use Topics  . Smoking status: Former Smoker -- 0.8 packs/day for 40 years    Types: Cigarettes    Quit date: 05/23/1990  . Smokeless tobacco:  Never Used  . Alcohol Use: No    OB History    Grav Para Term Preterm Abortions TAB SAB Ect Mult Living                  Review of Systems At least 10pt or greater review of systems completed and are negative except where specified in the HPI. Allergies  Omnipaque; Carbamazepine; and Tramadol  Home Medications   Current Outpatient Rx  Name  Route  Sig  Dispense  Refill  . ACETAMINOPHEN 325 MG PO TABS   Oral   Take 650 mg by mouth every 6 (six) hours as needed. For pain          . ALBUTEROL SULFATE HFA 108 (90 BASE) MCG/ACT IN AERS   Inhalation   Inhale 2 puffs into the lungs 4 (four) times daily as needed. Shortness of breath         . ASPIRIN 81 MG PO TBEC   Oral   Take 81 mg by mouth daily.           Marland Kitchen CALTRATE 600+D PLUS 600-400 MG-UNIT PO TABS   Oral   Take 1 tablet by mouth 2 (two) times daily.           Roslynn Amble OP   Both Eyes   Place  4 drops into both eyes 4 (four) times daily.           Marland Kitchen DABIGATRAN ETEXILATE MESYLATE 150 MG PO CAPS   Oral   Take 150 mg by mouth 2 (two) times daily.          Marland Kitchen DIGOXIN 0.125 MG PO TABS   Oral   Take 0.125 mg by mouth daily.         Marland Kitchen DILTIAZEM HCL ER COATED BEADS 120 MG PO CP24   Oral   Take 120 mg by mouth daily.           . ERGOCALCIFEROL 50000 UNITS PO CAPS   Oral   Take 50,000 Units by mouth once a week. Takes on Tuesdays.         . FUROSEMIDE 20 MG PO TABS   Oral   Take 20 mg by mouth daily.           Marland Kitchen HYDROCORTISONE VALERATE 0.2 % EX CREA   Topical   Apply 1 application topically as needed. For rash          . INULIN 2 G PO CHEW   Oral   Chew 1 tablet by mouth daily.           . ISOSORBIDE MONONITRATE ER 30 MG PO TB24   Oral   Take 1 tablet (30 mg total) by mouth daily.   30 tablet   6   . LEVETIRACETAM 250 MG PO TABS      Takes one tablet in the morning and half a tablet at 3 pm.         . ICAPS PO CAPS   Oral   Take 1 capsule by mouth 2 (two) times daily.           Marland Kitchen OMEPRAZOLE MAGNESIUM 20 MG PO TBEC   Oral   Take 20 mg by mouth 3 (three) times a week. Takes on Monday, Wednesday, & Friday         . POTASSIUM CHLORIDE 10 MEQ PO TBCR   Oral   Take 10 mEq by mouth daily.           Marland Kitchen PRAVACHOL 40 MG PO TABS      TAKE 1 TABLET BY MOUTH AT BEDTIME FOR CHOLESTEROL.   30 each   0     PLEASE SEND FUTURE REQUEST FOR THIS MEDICATION TO  .Marland Kitchen.   . ALIGN 4 MG PO CAPS   Oral   Take 1 capsule by mouth daily.             BP 139/83  Pulse 76  Temp 97.6 F (36.4 C) (Oral)  Resp 20  Ht 5\' 4"  (1.626 m)  Wt 140 lb (63.504 kg)  BMI 24.03 kg/m2  SpO2 96%  Physical Exam  Nursing notes reviewed.  Electronic medical record reviewed. VITAL SIGNS:   Filed Vitals:   04/22/12 0500 04/22/12 1004 04/22/12 1135 04/22/12 1358  BP: 84/49  76/52 112/58  Pulse: 98 84 90 84  Temp: 97.3 F (36.3 C)   98 F (36.7 C)  TempSrc: Oral   Oral  Resp: 16  16 18   Height:      Weight:      SpO2: 98%  98% 100%   CONSTITUTIONAL: Awake, oriented, appears non-toxic HENT: Atraumatic, normocephalic, oral mucosa pink and moist, airway patent. Nares patent without drainage. External ears normal. EYES: Conjunctiva clear, EOMI, PERRLA NECK: Trachea midline, non-tender, supple CARDIOVASCULAR: Normal heart rate,  Normal rhythm, No murmurs, rubs, gallops PULMONARY/CHEST: Clear to auscultation, no rhonchi, wheezes, or rales. Symmetrical breath sounds. Non-tender. ABDOMINAL: Non-distended, soft, non-tender - no rebound or guarding.  BS normal. NEUROLOGIC: Non-focal, moving all four extremities, no gross sensory or motor deficits. EXTREMITIES: No clubbing, cyanosis, or edema. Tenderness to palpation at right greater trochanter and in the right inguinal region.  Tenderness to palpation at ischial tuberosity on right. LLE pulses 2+, dorsiflexes and plantar flex ankles and toes SKIN: Warm, Dry, No erythema, No rash  ED Course  Procedures (including critical care  time)  DIAGNOSTIC STUDIES: Oxygen Saturation is 96% on room air, normal by my interpretation.    COORDINATION OF CARE:  17:48- Discussed planned course of treatment with the patient, including a lumbar spine, sacrum/coccyx, and left hip X-ray, who is agreeable at this time.  18:00- Medication Orders- hydromorphone (DILAUDID) injection 0.5 mh, ondansetron (ZOFRAN-ODT) disintegrating tablet 4 mg- Once.  19:23- Recheck- Updated pt on the X-ray findings including that there is a break in the pelvis and that surgery will not be required. Discussed that she will need to stay overnight here or possibly at [facility] and that an IV will need to be started.  Results for orders placed during the hospital encounter of 04/21/12  CBC WITH DIFFERENTIAL      Component Value Range   WBC 9.9  4.0 - 10.5 K/uL   RBC 4.67  3.87 - 5.11 MIL/uL   Hemoglobin 13.5  12.0 - 15.0 g/dL   HCT 91.4  78.2 - 95.6 %   MCV 88.4  78.0 - 100.0 fL   MCH 28.9  26.0 - 34.0 pg   MCHC 32.7  30.0 - 36.0 g/dL   RDW 21.3  08.6 - 57.8 %   Platelets 166  150 - 400 K/uL   Neutrophils Relative 88 (*) 43 - 77 %   Neutro Abs 8.7 (*) 1.7 - 7.7 K/uL   Lymphocytes Relative 6 (*) 12 - 46 %   Lymphs Abs 0.6 (*) 0.7 - 4.0 K/uL   Monocytes Relative 6  3 - 12 %   Monocytes Absolute 0.6  0.1 - 1.0 K/uL   Eosinophils Relative 0  0 - 5 %   Eosinophils Absolute 0.0  0.0 - 0.7 K/uL   Basophils Relative 0  0 - 1 %   Basophils Absolute 0.0  0.0 - 0.1 K/uL  BASIC METABOLIC PANEL      Component Value Range   Sodium 142  135 - 145 mEq/L   Potassium 3.2 (*) 3.5 - 5.1 mEq/L   Chloride 103  96 - 112 mEq/L   CO2 30  19 - 32 mEq/L   Glucose, Bld 137 (*) 70 - 99 mg/dL   BUN 14  6 - 23 mg/dL   Creatinine, Ser 4.69  0.50 - 1.10 mg/dL   Calcium 9.2  8.4 - 62.9 mg/dL   GFR calc non Af Amer 79 (*) >90 mL/min   GFR calc Af Amer >90  >90 mL/min     Labs Reviewed - No data to display Dg Lumbar Spine Complete  04/21/2012  *RADIOLOGY REPORT*  Clinical  Data: Fall with sacral pain.  LUMBAR SPINE - COMPLETE 4+ VIEW  Comparison: None  Findings: Lumbar vertebral bodies show normal alignment and no evidence of acute fracture.  No acute sacral fractures are identified.  The sacroiliac joints are symmetric and normally aligned.  IMPRESSION: No acute fractures.   Original Report Authenticated By: Irish Lack, M.D.  Dg Sacrum/coccyx  04/21/2012  *RADIOLOGY REPORT*  Clinical Data: Fall with sacral pain.  SACRUM AND COCCYX - 2+ VIEW  Comparison: None.  Findings: No acute fractures are seen involving the sacrum or coccyx.  Bony evaluation is somewhat limited due to severe osteopenia.  No soft tissue abnormalities are identified.  IMPRESSION: No obvious sacral or coccygeal fractures.   Original Report Authenticated By: Irish Lack, M.D.    Dg Hip Complete Left  04/21/2012  *RADIOLOGY REPORT*  Clinical Data: Fall with left hip and groin pain.  LEFT HIP - COMPLETE 2+ VIEW  Comparison: 08/02/2009  Findings: Mildly displaced and comminuted fractures are seen involving both the superior and inferior pubic rami on the left. There is no evidence of diastasis of the pubic symphysis.  No other acute injuries are identified. There is moderate osteoarthritis with joint space narrowing of both hip joints.  Soft tissues are unremarkable.  IMPRESSION: Acute and mildly displaced fractures of both the superior and inferior pubic rami on the left.   Original Report Authenticated By: Irish Lack, M.D.      1. Inferior pubic ramus fracture   2. Fracture of superior pubic ramus   3. Fall   4. Atrial fibrillation   5. Chronic diastolic heart failure   6. Fracture of multiple pubic rami   7. Hypotension   8. RLQ abdominal pain       MDM  Deborah [name] is a 78 y.o. female presents after fall and has inguinal pain and inability to walk.  Suspect pelvic fracture vs sacral fracture.  XR shows superior and inferior pubic rami fractures with minimal displacement.   D/W Ortho - pt to have pain control and early ambulation.  Admit to [facility].  D/W Dr. Orvan Falconer for admission.  Labs are unremarkable.  I personally performed the services described in this documentation, which was scribed in my presence. The recorded information has been reviewed and is accurate.        Jones Skene, MD 04/22/12 1530

## 2012-04-21 NOTE — ED Notes (Signed)
Pt states she tripped over a cord and now c/o left hip pain and groin pain.
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
## 2012-04-22 ENCOUNTER — Encounter (HOSPITAL_COMMUNITY): Payer: Self-pay | Admitting: Internal Medicine

## 2012-04-22 ENCOUNTER — Inpatient Hospital Stay (HOSPITAL_COMMUNITY): Payer: Medicare Other

## 2012-04-22 DIAGNOSIS — S36892A Contusion of other intra-abdominal organs, initial encounter: Secondary | ICD-10-CM | POA: Diagnosis present

## 2012-04-22 DIAGNOSIS — D62 Acute posthemorrhagic anemia: Secondary | ICD-10-CM | POA: Diagnosis present

## 2012-04-22 DIAGNOSIS — I959 Hypotension, unspecified: Secondary | ICD-10-CM

## 2012-04-22 DIAGNOSIS — S32599A Other specified fracture of unspecified pubis, initial encounter for closed fracture: Secondary | ICD-10-CM

## 2012-04-22 DIAGNOSIS — R1031 Right lower quadrant pain: Secondary | ICD-10-CM

## 2012-04-22 DIAGNOSIS — R58 Hemorrhage, not elsewhere classified: Secondary | ICD-10-CM

## 2012-04-22 HISTORY — DX: Hemorrhage, not elsewhere classified: R58

## 2012-04-22 HISTORY — DX: Other specified fracture of unspecified pubis, initial encounter for closed fracture: S32.599A

## 2012-04-22 LAB — URINALYSIS, ROUTINE W REFLEX MICROSCOPIC
Glucose, UA: NEGATIVE mg/dL
Hgb urine dipstick: NEGATIVE
Protein, ur: NEGATIVE mg/dL

## 2012-04-22 LAB — TSH
TSH: 4.119 u[IU]/mL (ref 0.350–4.500)
TSH: 2.031 u[IU]/mL (ref 0.350–4.500)

## 2012-04-22 LAB — CBC
HCT: 37.3 % (ref 36.0–46.0)
Hemoglobin: 12.2 g/dL (ref 12.0–15.0)
MCH: 29.3 pg (ref 26.0–34.0)
MCH: 29.4 pg (ref 26.0–34.0)
MCHC: 32.7 g/dL (ref 30.0–36.0)
MCHC: 32.7 g/dL (ref 30.0–36.0)
Platelets: 171 10*3/uL (ref 150–400)
RDW: 14.8 % (ref 11.5–15.5)

## 2012-04-22 LAB — PROTIME-INR
INR: 2.88 — ABNORMAL HIGH (ref 0.00–1.49)
Prothrombin Time: 28.7 seconds — ABNORMAL HIGH (ref 11.6–15.2)

## 2012-04-22 LAB — TROPONIN I: Troponin I: <0.3 ng/mL (ref <0.30)

## 2012-04-22 LAB — BASIC METABOLIC PANEL
BUN: 16 mg/dL (ref 6–23)
Chloride: 104 mEq/L (ref 96–112)
Glucose, Bld: 155 mg/dL — ABNORMAL HIGH (ref 70–99)
Potassium: 4.3 mEq/L (ref 3.5–5.1)

## 2012-04-22 LAB — CORTISOL: Cortisol, Plasma: 34.7 ug/dL

## 2012-04-22 LAB — LACTIC ACID, PLASMA: Lactic Acid, Venous: 4.7 mmol/L — ABNORMAL HIGH (ref 0.5–2.2)

## 2012-04-22 MED ORDER — VITAMIN K1 10 MG/ML IJ SOLN
5.0000 mg | INTRAMUSCULAR | Status: AC
  Administered 2012-04-23: 5 mg via INTRAVENOUS
  Filled 2012-04-22: qty 0.5

## 2012-04-22 MED ORDER — SODIUM CHLORIDE 0.9 % IV BOLUS (SEPSIS)
500.0000 mL | Freq: Once | INTRAVENOUS | Status: AC
  Administered 2012-04-22: 500 mL via INTRAVENOUS

## 2012-04-22 MED ORDER — SODIUM CHLORIDE 0.9 % IV SOLN: INTRAVENOUS | Status: DC
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
## 2012-04-22 NOTE — Progress Notes (Signed)
[facility]             Progress Note   Subjective: Patient reports feeling better today.  She is seen sitting up in the chair.  She denies any evidence of bleeding.  Reports pain is under better control now.  She denies any dizziness, shortness of breath or chest pain.  Objective: Vital signs in last 24 hours: Temp:  [97.1 F (36.2 C)-97.6 F (36.4 C)] 97.3 F (36.3 C) (12/01 0500) Pulse Rate:  [73-98] 90  (12/01 1135) Resp:  [16-20] 16  (12/01 1135) BP: (76-139)/(49-83) 76/52 mmHg (12/01 1135) SpO2:  [92 %-98 %] 98 % (12/01 1135) Weight:  [63.504 kg (140 lb)] 63.504 kg (140 lb) (11/30 1727) Weight change:  Last BM Date: 04/21/12  Intake/Output from previous day:       Physical Exam: General: Alert, awake, oriented x3, in no acute distress. HEENT: No bruits, no goiter. Heart: s1, s2, irregular Lungs: Clear to auscultation bilaterally. Abdomen: Soft, diffusely tender, nondistended, positive bowel sounds. Extremities: No clubbing cyanosis or edema with positive pedal pulses. Neuro: Grossly intact, nonfocal.    Lab Results: Basic Metabolic Panel:  Basename 04/22/12 0149 04/21/12 1920  NA 140 142  K 4.3 3.2*  CL 104 103  CO2 28 30  GLUCOSE 155* 137*  BUN 16 14  CREATININE 0.66 0.68  CALCIUM 9.2 9.2  MG -- 2.1  PHOS -- --   Liver Function Tests:  [facility] 04/21/12 1920  AST 21  ALT 13  ALKPHOS 64  BILITOT 0.2*  PROT 6.2  ALBUMIN 3.7   No results found for this basename: LIPASE:2,AMYLASE:2 in the last 72 hours No results found for this basename: AMMONIA:2 in the last 72 hours CBC:  Basename 04/22/12 0149 04/21/12 1920  WBC 13.4* 9.9  NEUTROABS -- 8.7*  HGB 12.2 13.5  HCT 37.3 41.3  MCV 89.4 88.4  PLT 170 166   Cardiac Enzymes:  Basename 04/22/12 0811 04/22/12 0149 04/21/12 1920  CKTOTAL -- -- --  CKMB -- -- --  CKMBINDEX -- -- --  TROPONINI <0.30 <0.30 <0.30   BNP: No results found for this basename: PROBNP:3 in the last 72  hours D-Dimer: No results found for this basename: DDIMER:2 in the last 72 hours CBG: No results found for this basename: GLUCAP:6 in the last 72 hours Hemoglobin A1C: No results found for this basename: HGBA1C in the last 72 hours Fasting Lipid Panel: No results found for this basename: CHOL,HDL,LDLCALC,TRIG,CHOLHDL,LDLDIRECT in the last 72 hours Thyroid Function Tests: No results found for this basename: TSH,T4TOTAL,FREET4,T3FREE,THYROIDAB in the last 72 hours Anemia Panel: No results found for this basename: VITAMINB12,FOLATE,FERRITIN,TIBC,IRON,RETICCTPCT in the last 72 hours Coagulation: No results found for this basename: LABPROT:2,INR:2 in the last 72 hours Urine Drug Screen: Drugs of Abuse  No results found for this basename: labopia, cocainscrnur, labbenz, amphetmu, thcu, labbarb    Alcohol Level: No results found for this basename: ETH:2 in the last 72 hours Urinalysis: No results found for this basename: COLORURINE:2,APPERANCEUR:2,LABSPEC:2,PHURINE:2,GLUCOSEU:2,HGBUR:2,BILIRUBINUR:2,KETONESUR:2,PROTEINUR:2,UROBILINOGEN:2,NITRITE:2,LEUKOCYTESUR:2 in the last 72 hours  No results found for this or any previous visit (from the past 240 hour(s)).  Studies/Results: Dg Lumbar Spine Complete  04/21/2012  *RADIOLOGY REPORT*  Clinical Data: Fall with sacral pain.  LUMBAR SPINE - COMPLETE 4+ VIEW  Comparison: None  Findings: Lumbar vertebral bodies show normal alignment and no evidence of acute fracture.  No acute sacral fractures are identified.  The sacroiliac joints are symmetric and normally aligned.  IMPRESSION: No acute fractures.  Original Report Authenticated By: Irish Lack, M.D.    Dg Sacrum/coccyx  04/21/2012  *RADIOLOGY REPORT*  Clinical Data: Fall with sacral pain.  SACRUM AND COCCYX - 2+ VIEW  Comparison: None.  Findings: No acute fractures are seen involving the sacrum or coccyx.  Bony evaluation is somewhat limited due to severe osteopenia.  No soft tissue  abnormalities are identified.  IMPRESSION: No obvious sacral or coccygeal fractures.   Original Report Authenticated By: Irish Lack, M.D.    Dg Hip Complete Left  04/21/2012  *RADIOLOGY REPORT*  Clinical Data: Fall with left hip and groin pain.  LEFT HIP - COMPLETE 2+ VIEW  Comparison: 08/02/2009  Findings: Mildly displaced and comminuted fractures are seen involving both the superior and inferior pubic rami on the left. There is no evidence of diastasis of the pubic symphysis.  No other acute injuries are identified. There is moderate osteoarthritis with joint space narrowing of both hip joints.  Soft tissues are unremarkable.  IMPRESSION: Acute and mildly displaced fractures of both the superior and inferior pubic rami on the left.   Original Report Authenticated By: Irish Lack, M.D.     Medications: Scheduled Meds:   . aspirin EC  81 mg Oral Daily  . digoxin  0.125 mg Oral Daily  . [COMPLETED]  HYDROmorphone (DILAUDID) injection  0.5 mg Intramuscular Once  . levETIRAcetam  125 mg Oral QPM  . levETIRAcetam  250 mg Oral Daily  . [COMPLETED] ondansetron  4 mg Oral Once  . pantoprazole  40 mg Oral Q M,W,F  . polyvinyl alcohol  1 drop Both Eyes QID  . potassium chloride  20 mEq Oral Daily  . [COMPLETED] potassium chloride  40 mEq Oral Once  . rivaroxaban  20 mg Oral Q breakfast  . senna  1 tablet Oral BID  . simvastatin  20 mg Oral q1800  . sodium chloride  500 mL Intravenous Once  . [DISCONTINUED] aspirin  81 mg Oral Daily  . [DISCONTINUED] Carboxymethylcellulose Sodium  4 drop Both Eyes QID  . [DISCONTINUED] diltiazem  120 mg Oral Daily  . [DISCONTINUED] furosemide  20 mg Oral Daily  . [DISCONTINUED] isosorbide mononitrate  30 mg Oral Daily  . [DISCONTINUED] omeprazole  20 mg Oral 3 times weekly   Continuous Infusions:   . sodium chloride    . [EXPIRED] 0.9 % NaCl with KCl 20 mEq / L 1,000 mL (04/21/12 2131)  . [DISCONTINUED] sodium chloride 0.9 % 1,000 mL with potassium  chloride 20 mEq infusion     PRN Meds:.acetaminophen, albuterol, bisacodyl, ondansetron (ZOFRAN) IV, ondansetron, oxyCODONE, polyethylene glycol, traZODone, [DISCONTINUED]  HYDROmorphone (DILAUDID) injection, [DISCONTINUED]  HYDROmorphone (DILAUDID) injection, [DISCONTINUED] ondansetron (ZOFRAN) IV  Assessment/Plan:  Principal Problem:  *Fracture of Left superior and inferior pubic rami Active Problems:  Permanent atrial fibrillation  DIASTOLIC HEART FAILURE, CHRONIC  Chronic anticoagulation  Coronary artery disease  COPD (chronic obstructive pulmonary disease)  Fall  Macular degeneration  RLQ abdominal pain  Hypotension  Plan:  1. Pelvic fracture s/p mechanical fall.  Non operative management. Patient will need physical therapy and pain management.  She will likely need SNF placement.  She lives alone.  2. Hypotension.  EKGs have been unremarkable and cardiac enzymes negative x3.  Will monitor on telemetry, repeat H/H, check lactic acid and give fluid bolus.  Check chest xray and urinalysis. She is afebrile and does not appear toxic, so suspicion for sepsis is low.  Could be related to dehydration. Check echo and serum cortisol/TSH.  3. Chronic atrial fib.  She is on xarelto for anticoagulation.  Rate control medications have been held due to hypotension.  4. Abdominal pain.  Patient has diffuse abdominal pain. Will check CT abdomen to rule out any underlying pathology, especially in light of her hypotension.  Time spent coordinating care:   LOS: 1 day   Naseem Adler [facility] Pager: [phone] 04/22/2012, 12:02 PM

## 2012-04-22 NOTE — Progress Notes (Signed)
Attempting to get pt. Up to bedside commode, pt's eyes rolled back in head and pt. Unresponsive x 3 seconds, pt. Picked up out of chair and placed back in bed, Dr. Kerry Hough notified, order received for foley catheter. Vital signs stable.

## 2012-04-23 ENCOUNTER — Inpatient Hospital Stay (HOSPITAL_COMMUNITY): Payer: Medicare Other

## 2012-04-23 ENCOUNTER — Encounter (HOSPITAL_COMMUNITY): Payer: Self-pay | Admitting: Internal Medicine

## 2012-04-23 ENCOUNTER — Encounter (HOSPITAL_COMMUNITY): Payer: Medicare Other

## 2012-04-23 DIAGNOSIS — I517 Cardiomegaly: Secondary | ICD-10-CM

## 2012-04-23 DIAGNOSIS — R55 Syncope and collapse: Secondary | ICD-10-CM | POA: Diagnosis not present

## 2012-04-23 LAB — CBC WITH DIFFERENTIAL/PLATELET
Basophils Absolute: 0 10*3/uL (ref 0.0–0.1)
Basophils Relative: 0 % (ref 0–1)
Eosinophils Relative: 0 % (ref 0–5)
Lymphocytes Relative: 5 % — ABNORMAL LOW (ref 12–46)
MCHC: 33.8 g/dL (ref 30.0–36.0)
Neutro Abs: 8.7 10*3/uL — ABNORMAL HIGH (ref 1.7–7.7)
Platelets: 80 10*3/uL — ABNORMAL LOW (ref 150–400)
RDW: 14.9 % (ref 11.5–15.5)
WBC: 10.9 10*3/uL — ABNORMAL HIGH (ref 4.0–10.5)

## 2012-04-23 LAB — CBC
HCT: 15.1 % — ABNORMAL LOW (ref 36.0–46.0)
HCT: 27.3 % — ABNORMAL LOW (ref 36.0–46.0)
Hemoglobin: 9.4 g/dL — ABNORMAL LOW (ref 12.0–15.0)
Hemoglobin: 8.8 g/dL — ABNORMAL LOW (ref 12.0–15.0)
MCH: 29.6 pg (ref 26.0–34.0)
MCH: 29.8 pg (ref 26.0–34.0)
MCV: 89.3 fL (ref 78.0–100.0)
RBC: 1.69 MIL/uL — ABNORMAL LOW (ref 3.87–5.11)
RBC: 2.95 MIL/uL — ABNORMAL LOW (ref 3.87–5.11)
RDW: 14.8 % (ref 11.5–15.5)
RDW: 14.6 % (ref 11.5–15.5)
WBC: 10.1 10*3/uL (ref 4.0–10.5)
WBC: 14.2 10*3/uL — ABNORMAL HIGH (ref 4.0–10.5)
WBC: 13.8 10*3/uL — ABNORMAL HIGH (ref 4.0–10.5)

## 2012-04-23 LAB — PREPARE RBC (CROSSMATCH)

## 2012-04-23 MED ORDER — MORPHINE SULFATE 2 MG/ML IJ SOLN
2.0000 mg | INTRAMUSCULAR | Status: DC | PRN
  Administered 2012-04-24 – 2012-04-29 (×6): 2 mg via INTRAVENOUS
  Filled 2012-04-23 (×6): qty 1

## 2012-04-23 MED ORDER — DILTIAZEM HCL 30 MG PO TABS
30.0000 mg | ORAL_TABLET | Freq: Four times a day (QID) | ORAL | Status: DC
  Administered 2012-04-23 – 2012-04-27 (×14): 30 mg via ORAL
  Filled 2012-04-23 (×13): qty 1

## 2012-04-23 MED ORDER — SODIUM CHLORIDE 0.9 % IJ SOLN
10.0000 mL | Freq: Two times a day (BID) | INTRAMUSCULAR | Status: DC
  Administered 2012-04-23: 10 mL
  Administered 2012-04-23: 20 mL
  Administered 2012-04-24 – 2012-04-25 (×3): 10 mL
  Administered 2012-04-25 – 2012-04-26 (×2): 20 mL
  Administered 2012-04-26 – 2012-04-28 (×3): 10 mL
  Administered 2012-04-29: 20 mL
  Administered 2012-04-29 – 2012-05-02 (×4): 10 mL
  Administered 2012-05-02: 20 mL
  Administered 2012-05-03: 10 mL
  Administered 2012-05-03: 20 mL
  Administered 2012-05-04: 10 mL

## 2012-04-23 MED ORDER — POTASSIUM CHLORIDE IN NACL 20-0.9 MEQ/L-% IV SOLN
INTRAVENOUS | Status: DC
  Administered 2012-04-23 – 2012-04-24 (×2): 75 mL/h via INTRAVENOUS
  Administered 2012-04-24: 1000 mL via INTRAVENOUS
  Administered 2012-04-25 – 2012-04-26 (×3): via INTRAVENOUS
  Administered 2012-04-27: 100 mL/h via INTRAVENOUS
  Administered 2012-04-27: 40 mL/h via INTRAVENOUS
  Administered 2012-04-27 – 2012-04-28 (×2): 1000 mL via INTRAVENOUS

## 2012-04-23 MED ORDER — SODIUM CHLORIDE 0.9 % IJ SOLN: 10.0000 mL | INTRAMUSCULAR | Status: DC | PRN

## 2012-04-23 MED ORDER — VITAMIN K1 10 MG/ML IJ SOLN
INTRAMUSCULAR | Status: AC
  Filled 2012-04-23: qty 1

## 2012-04-23 NOTE — Progress Notes (Signed)
PT Cancellation Note  Patient Details Name: Deborah [name] MRN: [id number] DOB: July 11, 1925   Cancelled Treatment:    Reason Eval/Treat Not Completed: Medical issues which prohibited therapy;Dr Sherrie Mustache holding PT due to hemorraging, will notify when appropriate.   Dereck Agerton ATKINSO 04/23/2012, 11:07 AM

## 2012-04-23 NOTE — Progress Notes (Signed)
Subjective: The patient is lying in bed. She says that her abdomen is sore. She has no other complaints. Her sister and her daughter are in her room, questions answered. Events overnight noted for the patient's hemoglobin falling to 5 g. The patient also was noted to have a brief episode of syncope yesterday afternoon while on the bedside commode.  Objective: Vital signs in last 24 hours: Filed Vitals:   04/23/12 0630 04/23/12 0645 04/23/12 0700 04/23/12 0730  BP: 115/103     Pulse: 112 37 102   Temp:  97.9 F (36.6 C)  98.2 F (36.8 C)  TempSrc:  Oral  Oral  Resp: 15 18 20    Height:      Weight:      SpO2:        Intake/Output Summary (Last 24 hours) at 04/23/12 1020 Last data filed at 04/23/12 4098  Gross per 24 hour  Intake   3337 ml  Output    450 ml  Net   2887 ml    Weight change:   Physical exam: General: Pleasant alert 78 year old Caucasian woman laying in bed, in no acute distress. Lungs: Clear anteriorly with decreased breath sounds in the bases. Breathing is nonlabored. Heart: Irregular, irregular, with tachycardia. Abdomen/pelvis: Firm, positive bowel sounds, moderately to exquisitely tender to palpation diffusely, no ecchymosis, no bruit. Extremities: No pedal edema. Neurologic: She is alert and oriented x2. Cranial nerves II through XII are grossly intact.  Lab Results: Basic Metabolic Panel:  Basename 04/22/12 0149 04/21/12 1920  NA 140 142  K 4.3 3.2*  CL 104 103  CO2 28 30  GLUCOSE 155* 137*  BUN 16 14  CREATININE 0.66 0.68  CALCIUM 9.2 9.2  MG -- 2.1  PHOS -- --   Liver Function Tests:  [facility] 04/21/12 1920  AST 21  ALT 13  ALKPHOS 64  BILITOT 0.2*  PROT 6.2  ALBUMIN 3.7   No results found for this basename: LIPASE:2,AMYLASE:2 in the last 72 hours No results found for this basename: AMMONIA:2 in the last 72 hours CBC:  Basename 04/23/12 0326 04/22/12 2131 04/21/12 1920  WBC 10.1 16.2* --  NEUTROABS -- -- 8.7*  HGB 5.0* 8.5* --    HCT 15.1* 26.0* --  MCV 89.3 90.0 --  PLT 91* 171 --   Cardiac Enzymes:  Basename 04/23/12 0326 04/22/12 0811 04/22/12 0149  CKTOTAL -- -- --  CKMB -- -- --  CKMBINDEX -- -- --  TROPONINI <0.30 <0.30 <0.30   BNP:  Basename 04/23/12 0326  PROBNP 190.9   D-Dimer: No results found for this basename: DDIMER:2 in the last 72 hours CBG: No results found for this basename: GLUCAP:6 in the last 72 hours Hemoglobin A1C:  Basename 04/21/12 1920  HGBA1C 5.7*   Fasting Lipid Panel: No results found for this basename: CHOL,HDL,LDLCALC,TRIG,CHOLHDL,LDLDIRECT in the last 72 hours Thyroid Function Tests:  Basename 04/22/12 1320  TSH 2.031  T4TOTAL --  FREET4 --  T3FREE --  THYROIDAB --   Anemia Panel: No results found for this basename: VITAMINB12,FOLATE,FERRITIN,TIBC,IRON,RETICCTPCT in the last 72 hours Coagulation:  Basename 04/22/12 2131  LABPROT 28.7*  INR 2.88*   Urine Drug Screen: Drugs of Abuse  No results found for this basename: labopia,  cocainscrnur,  labbenz,  amphetmu,  thcu,  labbarb    Alcohol Level: No results found for this basename: ETH:2 in the last 72 hours Urinalysis:  Basename 04/22/12 1625  COLORURINE YELLOW  LABSPEC >1.030*  PHURINE 6.0  GLUCOSEU  NEGATIVE  HGBUR NEGATIVE  BILIRUBINUR NEGATIVE  KETONESUR TRACE*  PROTEINUR NEGATIVE  UROBILINOGEN 0.2  NITRITE NEGATIVE  LEUKOCYTESUR NEGATIVE   Misc. Labs:   Micro: No results found for this or any previous visit (from the past 240 hour(s)).  Studies/Results: Ct Abdomen Pelvis Wo Contrast  04/22/2012  *RADIOLOGY REPORT*  Clinical Data: Abdominal pain.  Hypotension.Pelvic fractures.  CT ABDOMEN AND PELVIS WITHOUT CONTRAST  Technique:  Multidetector CT imaging of the abdomen and pelvis was performed following the standard protocol without intravenous contrast.  Comparison: Radiographs dated at 04/21/2012 and CT scan dated 05/31/2011  Findings: The patient has a large right-sided pelvic  hematoma as well as extensive retroperitoneal hemorrhage and hemorrhage extending into the anterior abdominal wall along both rectus muscles almost to the level of the umbilicus.  The right pelvic hematoma measures approximately 16 x 10 x 10 cm and has a mass effect upon the bladder.  Retroperitoneal hemorrhage extends in both pericolic gutters and in the posterior pararenal spaces.  There is a small amount of fluid over the right lobe of the liver which may be hemorrhage or ascites.  The liver, spleen, pancreas, adrenal glands, and kidneys demonstrate no significant abnormalities.  The bowel is normal except for sigmoid diverticulosis.  Again noted are the fractures of the left inferior superior pubic rami as well as both the left side of the pubic bone.  The hemorrhage appears to originate from the left pubic body fracture.  IMPRESSION:  Pelvic fractures with a large pelvic hematoma as well as retroperitoneal hemorrhage and hemorrhage into the anterior abdominal wall and extending superiorly in the posterior pararenal spaces.  Critical Value/emergent results were called by telephone at the time of interpretation on 04/22/2012 at 8:45 p.m. to Dr. Orvan Falconer, who verbally acknowledged these results.   Original Report Authenticated By: Francene Boyers, M.D.    Dg Lumbar Spine Complete  04/21/2012  *RADIOLOGY REPORT*  Clinical Data: Fall with sacral pain.  LUMBAR SPINE - COMPLETE 4+ VIEW  Comparison: None  Findings: Lumbar vertebral bodies show normal alignment and no evidence of acute fracture.  No acute sacral fractures are identified.  The sacroiliac joints are symmetric and normally aligned.  IMPRESSION: No acute fractures.   Original Report Authenticated By: Irish Lack, M.D.    Dg Sacrum/coccyx  04/21/2012  *RADIOLOGY REPORT*  Clinical Data: Fall with sacral pain.  SACRUM AND COCCYX - 2+ VIEW  Comparison: None.  Findings: No acute fractures are seen involving the sacrum or coccyx.  Bony evaluation is  somewhat limited due to severe osteopenia.  No soft tissue abnormalities are identified.  IMPRESSION: No obvious sacral or coccygeal fractures.   Original Report Authenticated By: Irish Lack, M.D.    Dg Hip Complete Left  04/21/2012  *RADIOLOGY REPORT*  Clinical Data: Fall with left hip and groin pain.  LEFT HIP - COMPLETE 2+ VIEW  Comparison: 08/02/2009  Findings: Mildly displaced and comminuted fractures are seen involving both the superior and inferior pubic rami on the left. There is no evidence of diastasis of the pubic symphysis.  No other acute injuries are identified. There is moderate osteoarthritis with joint space narrowing of both hip joints.  Soft tissues are unremarkable.  IMPRESSION: Acute and mildly displaced fractures of both the superior and inferior pubic rami on the left.   Original Report Authenticated By: Irish Lack, M.D.    Dg Chest Port 1 View  04/23/2012  *RADIOLOGY REPORT*  Clinical Data: Line placement  PORTABLE CHEST - 1 VIEW  Comparison: Earlier today at 5:10 hours  Findings: Right-sided PICC line which is difficult to follow centrally.  Followed to at least the level of the low SVC. No pneumothorax.  Borderline cardiomegaly.  No pleural fluid. Clear lungs.  IMPRESSION: Right-sided PICC line which is difficult to follow centrally. Followed to at least the level of the low SVC.  Consider retraction 2.6 cm with repeat radiograph.   Original Report Authenticated By: Jeronimo Greaves, M.D.    Dg Chest Port 1 View  04/23/2012  *RADIOLOGY REPORT*  Clinical Data: Tachycardia.  PORTABLE CHEST - 1 VIEW  Comparison: Chest radiograph performed 04/22/2012  Findings: The lungs are well-aerated and clear.  There is no evidence of focal opacification, pleural effusion or pneumothorax.  The cardiomediastinal silhouette is within normal limits.  Mild apparent right hilar prominence is stable from prior studies.  No acute osseous abnormalities are seen.  IMPRESSION: No acute cardiopulmonary  process seen.   Original Report Authenticated By: Tonia Ghent, M.D.    Dg Chest Port 1 View  04/22/2012  *RADIOLOGY REPORT*  Clinical Data: Hypotension  PORTABLE CHEST - 1 VIEW  Comparison: 06/12/2011  Findings: Lungs are clear.  Negative for pneumonia.  Negative for heart failure or effusion.  Heart size is normal.  IMPRESSION: No acute abnormality.   Original Report Authenticated By: Janeece Riggers, M.D.    Dg Chest Port 1v Same Day  04/23/2012  *RADIOLOGY REPORT*  Clinical Data: Repositioning of PICC line.  PORTABLE CHEST - 1 VIEW SAME DAY  Comparison: Earlier today at 0842 hours.  Findings: 0923 hours.  Right-sided PICC line terminates at the low SVC. No pneumothorax.  Borderline cardiomegaly. No pleural fluid.  Clear lungs.  IMPRESSION: Right-sided PICC line terminating at the low SVC.  No pneumothorax or other acute cardiopulmonary process.   Original Report Authenticated By: Jeronimo Greaves, M.D.     Medications:  Scheduled:   . digoxin  0.125 mg Oral Daily  . levETIRAcetam  125 mg Oral QPM  . levETIRAcetam  250 mg Oral Daily  . pantoprazole  40 mg Oral Q M,W,F  . [COMPLETED] phytonadione (VITAMIN K) IV  5 mg Intravenous STAT  . polyvinyl alcohol  1 drop Both Eyes QID  . potassium chloride  20 mEq Oral Daily  . senna  1 tablet Oral BID  . simvastatin  20 mg Oral q1800  . [COMPLETED] sodium chloride  500 mL Intravenous Once  . sodium chloride  10-40 mL Intracatheter Q12H  . [DISCONTINUED] aspirin EC  81 mg Oral Daily  . [DISCONTINUED] rivaroxaban  20 mg Oral Q breakfast   Continuous:   . sodium chloride Stopped (04/23/12 0445)   YNW:GNFAOZHYQMVHQ, albuterol, bisacodyl, ondansetron (ZOFRAN) IV, ondansetron, oxyCODONE, polyethylene glycol, sodium chloride, traZODone  Assessment: Principal Problem:  *Fracture of Left superior and inferior pubic rami Active Problems:  Traumatic retroperitoneal hematoma  Acute post-hemorrhagic anemia  Permanent atrial fibrillation  DIASTOLIC HEART  FAILURE, CHRONIC  Chronic anticoagulation  Coronary artery disease  COPD (chronic obstructive pulmonary disease)  Fall  Macular degeneration  RLQ abdominal pain  Hypotension  Syncope    1. Newly diagnosed large pelvic hematoma as well as retroperitoneal hemorrhage and hemorrhage into the anterior abdominal wall in the setting of pelvic fracture as an anti-coagulation. The patient was discussed with my colleague Dr. Orvan Falconer who discussed the finding with the trauma surgeon on call at [facility]. Apparently, there appears to be no surgical indication at this time.  Pelvic fractures/acute and mildly displaced fractures of both  superior and inferior pubic rami on the left; status post fall at home.. Orthopedic surgery has been consulted for assessment and further recommendations.  Abdominal pain secondary to above.  Coagulopathy/anticoagulation secondary to chronic atrial fibrillation. Xarelto and aspirin have both been discontinued in the setting of active bleeding. She is status post vitamin K and 4 units of fresh frozen plasma.  Anemia associated hemorrhage/acute blood loss. The patient's hemoglobin was 12.2 on admission and has just it down to a nadir of 5.0. She is status post 2 units of packed red blood cells. Another 2 units of been ordered. We'll continue to monitor her hemoglobin/hematocrit.  Hypotension secondary to acute blood loss. With IV fluid hydration and transfusion of blood products, her blood pressure is much better. Her cortisol level is within normal limits.  Brief syncope secondary to hypotension. No focal neurological abnormalities noted.  Chronic atrial fibrillation. Her heart rate is mildly elevated off of diltiazem. She is currently on digoxin.   Chronic diastolic congestive heart failure. Currently stable and compensated. Her chest x-ray is without edema. Her proBNP is within normal limits. Her troponin I is negative. We'll continue to hold Lasix for  now.    Plan:  1. Continue blood transfusions, but will assess CBC accordingly. Continue IV fluid hydration. 2. Hold physical therapy until notified further. 3. Restart diltiazem at 4 times a day dosing (30 mg q. 6 hours). 4. Orthopedic consultation. 5. 2-D echocardiogram pending. 6. Downgrade diet to clear liquid diet until more clinically stable.       Total critical care time: 45 minutes.   LOS: 2 days   Marlene Pfluger 04/23/2012, 10:20 AM

## 2012-04-23 NOTE — Care Management Note (Signed)
    Page 1 of 1   05/04/2012     2:07:33 PM   CARE MANAGEMENT NOTE 05/04/2012  Patient:  Deborah [name], Deborah [name]   Account Number:  [id number]  Date Initiated:  04/23/2012  Documentation initiated by:  Rosemary Holms  Subjective/Objective Assessment:   Pt admitted from home where she lives alone. Daughter is RN here at [facility]. Fx Pelvis and will be worked up for SNF placement.     Action/Plan:   Anticipated DC Date:  05/04/2012   Anticipated DC Plan:  SKILLED NURSING FACILITY  In-house referral  Clinical Social Worker      DC Planning Services  CM consult      Choice offered to / List presented to:             Status of service:  Completed, signed off Medicare Important Message given?   (If response is "NO", the following Medicare IM given date fields will be blank) Date Medicare IM given:   Date Additional Medicare IM given:    Discharge Disposition:  SKILLED NURSING FACILITY  Per UR Regulation:    If discussed at Long Length of Stay Meetings, dates discussed:   04/26/2012  05/01/2012    Comments:  04/30/12 Pt in ICU due to instability of HR and Hypotension. Once stable, plan to DC to [facility] SN[facility]  04/23/12 Deborah [name] Deborah Hawking RN BSN CM

## 2012-04-23 NOTE — Progress Notes (Signed)
Dr Sherrie Mustache paged and made aware of repeat hemoglobin of 7.8. Order received to transfuse 1 more unit of PRBC's. Pt and pts daughter updated and made aware. Will continue to monitor.

## 2012-04-23 NOTE — Progress Notes (Signed)
Utilization Review Complete  

## 2012-04-23 NOTE — Clinical Social Work Placement (Signed)
Clinical Social Work Department CLINICAL SOCIAL WORK PLACEMENT NOTE 04/23/2012  Patient:  Deborah [name], Deborah [name]  Account Number:  [id number] Admit date:  04/21/2012  Clinical Social Worker:  Derenda Fennel, LCSW  Date/time:  04/23/2012 11:52 AM  Clinical Social Work is seeking post-discharge placement for this patient at the following level of care:   SKILLED NURSING   (*CSW will update this form in Epic as items are completed)   04/23/2012  Patient/family provided with [facility] Department of Clinical Social Work's list of facilities offering this level of care within the geographic area requested by the patient (or if unable, by the patient's family).  04/23/2012  Patient/family informed of their freedom to choose among providers that offer the needed level of care, that participate in Medicare, Medicaid or managed care program needed by the patient, have an available bed and are willing to accept the patient.  04/23/2012  Patient/family informed of [facility]' ownership interest in [facility], as well as of the fact that they are under no obligation to receive care at this facility.  PASARR submitted to EDS on 04/23/2012 PASARR number received from EDS on 04/23/2012  FL2 transmitted to all facilities in geographic area requested by pt/family on  04/23/2012 FL2 transmitted to all facilities within larger geographic area on   Patient informed that his/her managed care company has contracts with or will negotiate with  certain facilities, including the following:     Patient/family informed of bed offers received:   Patient chooses bed at  Physician recommends and patient chooses bed at    Patient to be transferred to  on   Patient to be transferred to facility by   The following physician request were entered in Epic:   Additional Comments:  Derenda Fennel, LCSW [phone]

## 2012-04-23 NOTE — Progress Notes (Signed)
Patient ID: Deborah [name], female   DOB: [birth year], 78 y.o.   MRN: [id number] Consult requested by hospitalist Dr. Lendell Caprice  Left superior and inferior pubic ramus fracture nondisplaced  Recommend weightbearing as tolerated pain medication as needed  Will come by and see the patient later today.

## 2012-04-23 NOTE — Progress Notes (Signed)
OT Cancellation Note  Patient Details Name: Deborah [name] MRN: [id number] DOB: 24-Aug-1925   Cancelled Treatment:    Reason Eval/Treat Not Completed: Medical issues which prohibited therapy. Dr Sherrie Mustache holding PT until further notice. OT will also hold until patient is medically stable.     [name], OTR/L 04/23/2012, 1:35 PM

## 2012-04-23 NOTE — Progress Notes (Signed)
*  PRELIMINARY RESULTS* Echocardiogram 2D Echocardiogram has been performed.  Deborah [name] 04/23/2012, 4:21 PM

## 2012-04-23 NOTE — Clinical Social Work Psychosocial (Signed)
Clinical Social Work Department BRIEF PSYCHOSOCIAL ASSESSMENT 04/23/2012  Patient:  Deborah [name], Deborah [name]     Account Number:  [id number]     Admit date:  04/21/2012  Clinical Social Worker:  Nancie Neas  Date/Time:  04/23/2012 11:55 AM  Referred by:  Physician  Date Referred:  04/23/2012 Referred for  SNF Placement   Other Referral:   Interview type:  Patient Other interview type:   message left for daughter- Darl Pikes    PSYCHOSOCIAL DATA Living Status:  ALONE Admitted from facility:   Level of care:   Primary support name:  Darl Pikes Primary support relationship to patient:  CHILD, ADULT Degree of support available:   supportive per pt    CURRENT CONCERNS Current Concerns  Post-Acute Placement   Other Concerns:    SOCIAL WORK ASSESSMENT / PLAN CSW met with pt at bedside following recommendation from PT for SNF. Pt alert and oriented for the most part, but did have brief moments of confusion. She states she lives alone and generally manages okay. Pt has a walker at home if needed. She has paid a friend for 4 days a week, 7 hours a day to come in and help with cooking, cleaning, grocery shopping, and transportation to MD appointments. Her daughter also lives nearby and is supportive per pt. CSW discussed placement process with pt and she is agreeable. She requests [place] if possible. Pt aware of Medicare coverage/criteria. CSW left voicemail for return call to discuss d/c plan.   Assessment/plan status:  Psychosocial Support/Ongoing Assessment of Needs Other assessment/ plan:   Information/referral to community resources:   SNF list    PATIENT'S/FAMILY'S RESPONSE TO PLAN OF CARE: Pt agreeable to SNF at d/c. CSW to fax out FL2 and follow up with bed offers when available.        Derenda Fennel, [place] [phone]

## 2012-04-23 NOTE — Progress Notes (Signed)
Bath completed tonight, ecchymosis to suprapubic area noted, back remains free of ecchymosis. Denture care provided, patient guarding.  Denies need for pain medicine.  Confused to year, oriented to place and president. Spoke with daughter Darl Pikes.  Patient resting at present.

## 2012-04-24 ENCOUNTER — Inpatient Hospital Stay (HOSPITAL_COMMUNITY): Payer: Medicare Other

## 2012-04-24 DIAGNOSIS — S36899A Unspecified injury of other intra-abdominal organs, initial encounter: Secondary | ICD-10-CM

## 2012-04-24 DIAGNOSIS — D696 Thrombocytopenia, unspecified: Secondary | ICD-10-CM | POA: Diagnosis not present

## 2012-04-24 DIAGNOSIS — J449 Chronic obstructive pulmonary disease, unspecified: Secondary | ICD-10-CM

## 2012-04-24 LAB — COMPREHENSIVE METABOLIC PANEL
ALT: 9 U/L (ref 0–35)
Alkaline Phosphatase: 53 U/L (ref 39–117)
BUN: 15 mg/dL (ref 6–23)
CO2: 26 mEq/L (ref 19–32)
Chloride: 103 mEq/L (ref 96–112)
GFR calc Af Amer: >90 mL/min (ref >90)
GFR calc non Af Amer: 84 mL/min — ABNORMAL LOW (ref >90)
Glucose, Bld: 111 mg/dL — ABNORMAL HIGH (ref 70–99)
Potassium: 4.6 mEq/L (ref 3.5–5.1)
Sodium: 136 mEq/L (ref 135–145)
Total Bilirubin: 0.6 mg/dL (ref 0.3–1.2)
Total Protein: 5.4 g/dL — ABNORMAL LOW (ref 6.0–8.3)

## 2012-04-24 LAB — PREPARE RBC (CROSSMATCH)

## 2012-04-24 LAB — PREPARE FRESH FROZEN PLASMA
Unit division: 0
Unit division: 0

## 2012-04-24 LAB — CBC
HCT: 23.6 % — ABNORMAL LOW (ref 36.0–46.0)
HCT: 28 % — ABNORMAL LOW (ref 36.0–46.0)
Hemoglobin: 8 g/dL — ABNORMAL LOW (ref 12.0–15.0)
Hemoglobin: 9.5 g/dL — ABNORMAL LOW (ref 12.0–15.0)
MCH: 29.8 pg (ref 26.0–34.0)
MCH: 29.5 pg (ref 26.0–34.0)
MCH: 29.9 pg (ref 26.0–34.0)
MCHC: 33.9 g/dL (ref 30.0–36.0)
MCHC: 34.3 g/dL (ref 30.0–36.0)
MCV: 85.9 fL (ref 78.0–100.0)
MCV: 86.4 fL (ref 78.0–100.0)
Platelets: 89 10*3/uL — ABNORMAL LOW (ref 150–400)
Platelets: 97 10*3/uL — ABNORMAL LOW (ref 150–400)
RBC: 2.74 MIL/uL — ABNORMAL LOW (ref 3.87–5.11)
RBC: 3.01 MIL/uL — ABNORMAL LOW (ref 3.87–5.11)
RDW: 14.5 % (ref 11.5–15.5)
RDW: 14.7 % (ref 11.5–15.5)
RDW: 14.8 % (ref 11.5–15.5)
WBC: 7.8 10*3/uL (ref 4.0–10.5)

## 2012-04-24 LAB — PROTIME-INR: Prothrombin Time: 15.2 seconds (ref 11.6–15.2)

## 2012-04-24 MED ORDER — BISACODYL 10 MG RE SUPP
10.0000 mg | Freq: Every day | RECTAL | Status: DC | PRN
  Administered 2012-04-25: 10 mg via RECTAL
  Filled 2012-04-24: qty 1

## 2012-04-24 MED ORDER — POLYETHYLENE GLYCOL 3350 17 G PO PACK
17.0000 g | PACK | Freq: Every day | ORAL | Status: DC
  Administered 2012-04-24 – 2012-04-25 (×2): 17 g via ORAL
  Filled 2012-04-24 (×2): qty 1

## 2012-04-24 MED ORDER — ACETAMINOPHEN 325 MG PO TABS
650.0000 mg | ORAL_TABLET | ORAL | Status: DC | PRN
  Administered 2012-04-24 – 2012-05-04 (×10): 650 mg via ORAL
  Filled 2012-04-24 (×9): qty 2

## 2012-04-24 MED ORDER — LEVALBUTEROL HCL 0.63 MG/3ML IN NEBU
0.6300 mg | INHALATION_SOLUTION | Freq: Three times a day (TID) | RESPIRATORY_TRACT | Status: DC
  Administered 2012-04-24 – 2012-04-26 (×7): 0.63 mg via RESPIRATORY_TRACT
  Filled 2012-04-24 (×7): qty 3

## 2012-04-24 MED ORDER — ACETAMINOPHEN 325 MG PO TABS
650.0000 mg | ORAL_TABLET | Freq: Once | ORAL | Status: DC
  Filled 2012-04-24: qty 2

## 2012-04-24 NOTE — Clinical Social Work Note (Addendum)
CSW spoke with pt's daughter yesterday and explained placement process. CSW presented bed offers to pt and pt's daughter. They choose [facility]. Facility notified. Pt and daughter aware that offer is pending bed availability when stable. CSW will keep [facility] updated at their request and will continue to follow.  Derenda Fennel, [place] [phone]

## 2012-04-24 NOTE — Evaluation (Signed)
Occupational Therapy Evaluation Patient Details Name: Deborah [name] MRN: [id number] DOB: 1925/09/06 Today's Date: 04/24/2012 Time: 1343-1400 OT Time Calculation (min): 17 min  OT Assessment / Plan / Recommendation Clinical Impression  Patient is a 78 y/o female s/p Left superior and inferior Pubic fx presening to acute OT with deficits below. Patient will benefit from OT services to increase ADL performance, functional transfers, and Bil UE strength and endurance. Patient is currently on bedrest orders per Dr. Romeo Apple. ADL performance was not assessed during eval. Patient will  benefit from SNF at D/C.    OT Assessment  Patient needs continued OT Services    Follow Up Recommendations  SNF    Barriers to Discharge Decreased caregiver support Aide available 5 hours/day; 4 days/wk. Assists with transportation, light housekeeping, light meal prep.  Equipment Recommendations  3 in 1 bedside comode       Frequency  Min 2X/week    Precautions / Restrictions Precautions Precautions: Fall;Other (comment) Precaution Comments: Bedrest per Dr. Starling Manns. Non strenuous exercises only Restrictions Weight Bearing Restrictions: Yes LLE Weight Bearing: Weight bearing as tolerated   Pertinent Vitals/Pain No complaints.    ADL  Transfers/Ambulation Related to ADLs: Unable to perform transfer due to bedrest orders.    OT Diagnosis: Generalized weakness  OT Problem List: Decreased strength;Impaired balance (sitting and/or standing);Decreased range of motion;Decreased activity tolerance;Decreased safety awareness OT Treatment Interventions: Self-care/ADL training;Therapeutic exercise;Energy conservation;Therapeutic activities;Patient/family education;Balance training   OT Goals Acute Rehab OT Goals OT Goal Formulation: With patient Time For Goal Achievement: 05/08/12 Potential to Achieve Goals: Fair ADL Goals Pt Will Perform Grooming: with set-up;Sitting, edge of bed ADL Goal: Grooming -  Progress: Goal set today Pt Will Perform Upper Body Bathing: with set-up;Sitting, edge of bed ADL Goal: Upper Body Bathing - Progress: Goal set today Pt Will Perform Lower Body Bathing: with mod assist;Sit to stand from bed ADL Goal: Lower Body Bathing - Progress: Goal set today Pt Will Perform Upper Body Dressing: with set-up;Sitting, bed ADL Goal: Upper Body Dressing - Progress: Goal set today Pt Will Perform Lower Body Dressing: with mod assist;Sit to stand from bed ADL Goal: Lower Body Dressing - Progress: Goal set today Pt Will Transfer to Toilet: with mod assist;with DME;3-in-1;Stand pivot transfer ADL Goal: Toilet Transfer - Progress: Goal set today Arm Goals Pt Will Complete Theraband Exer: with supervision, verbal cues required/provided;Bilateral upper extremities;to maintain strength;1 set;10 reps;Level 1 Theraband Arm Goal: Theraband Exercises - Progress: Goal set today  Visit Information  Last OT Received On: 04/24/12 Assistance Needed: +1    Subjective Data  Subjective: "I feel so much better today compared to the last two days." Patient Stated Goal: To go home.   Prior Functioning     Home Living Lives With: Alone Available Help at Discharge: Family;Available PRN/intermittently;Personal care attendant (Aide available 5 hrs/day 4 days/wk) Type of Home: House Home Access: Stairs to enter [employer] of Steps: 2 Entrance Stairs-Rails: Right Home Layout: One level Bathroom Shower/Tub: Engineer, manufacturing systems: Standard Bathroom Accessibility: Yes How Accessible: Accessible via walker Home Adaptive Equipment: Grab bars in shower;Walker - rolling;Tub transfer bench Prior Function Level of Independence: Independent with assistive device(s) Able to Take Stairs?: Yes Driving: No Vocation: Retired Comments: Age-related Macular Degeneration - 8 years Communication Communication: No difficulties Dominant Hand: Right         Vision/Perception   AMD bilateral eyes   Cognition  Overall Cognitive Status: Appears within functional limits for tasks assessed/performed Arousal/Alertness: Awake/alert Orientation Level: Appears  intact for tasks assessed Behavior During Session: [facility] for tasks performed    Extremity/Trunk Assessment Right Upper Extremity Assessment RUE ROM/Strength/Tone: Within functional levels (MMT: 4/5) RUE Sensation: WFL - Light Touch;WFL - Proprioception RUE Coordination: WFL - gross/fine motor Left Upper Extremity Assessment LUE ROM/Strength/Tone: Within functional levels (MMT: 4/5) LUE Sensation: WFL - Light Touch;WFL - Proprioception LUE Coordination: WFL - gross/fine motor                 End of Session OT - End of Session Activity Tolerance: Patient tolerated treatment well Patient left: in bed;with call bell/phone within reach;with bed alarm set    [name], OTR/L 04/24/2012, 2:10 PM

## 2012-04-24 NOTE — Consult Note (Addendum)
Reason for Consult:pelvic fracture  Referring Physician: dr Deborah [name] is an 78 y.o. female.  HPI: This is an 78 year old female who is anticoagulated secondary to chronic atrial fibrillation, she was a household ambulator with no assistive devices and was able to live alone who presents after a fall fracturing her left superior and inferior pubic ramus which progressed to a 10 cm hematoma in the retroperitoneal area leading to a drop in her hemoglobin down to 5.8. She was seen by therapy and attempted ambulation but became hypovolemic and diaphoretic. She is not in a significant amount of pain at this time but is having difficulty getting up to ambulate.  Past Medical History  Diagnosis Date  . Decreased left ventricular function     EF normalized by Lexiscan 2010: 69%  . Chronic diastolic heart failure   . A-fib     permanent; rate controlled   . COPD (chronic obstructive pulmonary disease)   . Drug intolerance     Flecacinide and Amiodarone   . CHF (congestive heart failure)   . Asthma   . Umbilical hernia   . Coronary artery disease     status post bare-metal stent to the circumflex coronary artery2009  . Chronic anticoagulation     on dabigatran with no complications  . Seizures     Remote history of over 20 years ago  . Anemia due to blood loss, acute 04/23/2012  . Permanent atrial fibrillation 02/10/2009    Qualifier: Diagnosis of  By: Deborah [name]      Past Surgical History  Procedure Date  . Radiofrequency catheter ablation     failed  . Right carpel tunnel release   . Cataract extraction   . Tonsillectomy     Family History  Problem Relation Age of Onset  . Coronary artery disease Mother     fhx     Social History:  reports that she quit smoking about 21 years ago. Her smoking use included Cigarettes. She has a 32 pack-year smoking history. She has never used smokeless tobacco. She reports that she does not drink alcohol or use illicit  drugs.  Allergies:  Allergies  Allergen Reactions  . Omnipaque (Iohexol) Shortness Of Breath and Other (See Comments)    Short of breath with chest tightness after IV injection in CT, pt was fine when she left department but developed symptoms in parking lot and went to emergency department.  . Carbamazepine     REACTION: toxemia  . Tramadol Other (See Comments)    Unknown     Medications: I have reviewed the patient's current medications.  Results for orders placed during the hospital encounter of 04/21/12 (from the past 48 hour(s))  HEMOGLOBIN AND HEMATOCRIT, BLOOD     Status: Abnormal   Collection Time   04/22/12  1:00 PM      Component Value Range Comment   Hemoglobin 10.3 (*) 12.0 - 15.0 g/dL    HCT 11.9 (*) 14.7 - 46.0 %   LACTIC ACID, PLASMA     Status: Abnormal   Collection Time   04/22/12  1:20 PM      Component Value Range Comment   Lactic Acid, Venous 4.7 (*) 0.5 - 2.2 mmol/L   CORTISOL     Status: Normal   Collection Time   04/22/12  1:20 PM      Component Value Range Comment   Cortisol, Plasma 34.7     TSH     Status:  Normal   Collection Time   04/22/12  1:20 PM      Component Value Range Comment   TSH 2.031  0.350 - 4.500 uIU/mL   URINALYSIS, ROUTINE W REFLEX MICROSCOPIC     Status: Abnormal   Collection Time   04/22/12  4:25 PM      Component Value Range Comment   Color, Urine YELLOW  YELLOW    APPearance CLEAR  CLEAR    Specific Gravity, Urine >1.030 (*) 1.005 - 1.030    pH 6.0  5.0 - 8.0    Glucose, UA NEGATIVE  NEGATIVE mg/dL    Hgb urine dipstick NEGATIVE  NEGATIVE    Bilirubin Urine NEGATIVE  NEGATIVE    Ketones, ur TRACE (*) NEGATIVE mg/dL    Protein, ur NEGATIVE  NEGATIVE mg/dL    Urobilinogen, UA 0.2  0.0 - 1.0 mg/dL    Nitrite NEGATIVE  NEGATIVE    Leukocytes, UA NEGATIVE  NEGATIVE MICROSCOPIC NOT DONE ON URINES WITH NEGATIVE PROTEIN, BLOOD, LEUKOCYTES, NITRITE, OR GLUCOSE <1000 mg/dL.  TYPE AND SCREEN     Status: Normal (Preliminary result)    Collection Time   04/22/12  9:30 PM      Component Value Range Comment   ABO/RH(D) A POS      Antibody Screen NEG      Sample Expiration 04/25/2012      Unit Number [id number]      Blood Component Type RED CELLS,LR      Unit division 00      Status of Unit ISSUED      Transfusion Status OK TO TRANSFUSE      Crossmatch Result Compatible      Unit Number [id number]      Blood Component Type RED CELLS,LR      Unit division 00      Status of Unit ISSUED      Transfusion Status OK TO TRANSFUSE      Crossmatch Result Compatible      Unit Number [id number]      Blood Component Type RED CELLS,LR      Unit division 00      Status of Unit ISSUED      Transfusion Status OK TO TRANSFUSE      Crossmatch Result Compatible      Unit Number [id number]      Blood Component Type RED CELLS,LR      Unit division 00      Status of Unit ISSUED      Transfusion Status OK TO TRANSFUSE      Crossmatch Result Compatible      Unit Number [id number]      Blood Component Type RED CELLS,LR      Unit division 00      Status of Unit ALLOCATED      Transfusion Status OK TO TRANSFUSE      Crossmatch Result Compatible      Unit Number [id number]      Blood Component Type RED CELLS,LR      Unit division 00      Status of Unit ALLOCATED      Transfusion Status OK TO TRANSFUSE      Crossmatch Result Compatible     ABO/RH     Status: Normal   Collection Time   04/22/12  9:30 PM      Component Value Range Comment   ABO/RH(D) A POS     PREPARE RBC (CROSSMATCH)     Status: Normal  Collection Time   04/22/12  9:30 PM      Component Value Range Comment   Order Confirmation ORDER PROCESSED BY BLOOD BANK     CBC     Status: Abnormal   Collection Time   04/22/12  9:31 PM      Component Value Range Comment   WBC 16.2 (*) 4.0 - 10.5 K/uL    RBC 2.89 (*) 3.87 - 5.11 MIL/uL    Hemoglobin 8.5 (*) 12.0 - 15.0 g/dL DELTA CHECK NOTED   HCT 26.0 (*) 36.0 - 46.0 %    MCV 90.0  78.0 - 100.0 fL     MCH 29.4  26.0 - 34.0 pg    MCHC 32.7  30.0 - 36.0 g/dL    RDW 40.9  81.1 - 91.4 %    Platelets 171  150 - 400 K/uL   PROTIME-INR     Status: Abnormal   Collection Time   04/22/12  9:31 PM      Component Value Range Comment   Prothrombin Time 28.7 (*) 11.6 - 15.2 seconds    INR 2.88 (*) 0.00 - 1.49   APTT     Status: Abnormal   Collection Time   04/22/12  9:31 PM      Component Value Range Comment   aPTT 38 (*) 24 - 37 seconds   PREPARE FRESH FROZEN PLASMA     Status: Normal (Preliminary result)   Collection Time   04/22/12  9:31 PM      Component Value Range Comment   Unit Number [id number]      Blood Component Type THAWED PLASMA      Unit division 00      Status of Unit ISSUED,FINAL      Transfusion Status OK TO TRANSFUSE      Unit Number [id number]      Blood Component Type THAWED PLASMA      Unit division 00      Status of Unit ISSUED      Transfusion Status OK TO TRANSFUSE      Unit Number [id number]      Blood Component Type THAWED PLASMA      Unit division 00      Status of Unit ISSUED      Transfusion Status OK TO TRANSFUSE      Unit Number [id number]      Blood Component Type THAWED PLASMA      Unit division 00      Status of Unit ISSUED      Transfusion Status OK TO TRANSFUSE     PREPARE RBC (CROSSMATCH)     Status: Normal   Collection Time   04/22/12  9:31 PM      Component Value Range Comment   Order Confirmation ORDER PROCESSED BY BLOOD BANK     MRSA PCR SCREENING     Status: Normal   Collection Time   04/22/12  9:53 PM      Component Value Range Comment   MRSA by PCR NEGATIVE  NEGATIVE   LACTIC ACID, PLASMA     Status: Abnormal   Collection Time   04/23/12  3:26 AM      Component Value Range Comment   Lactic Acid, Venous 3.7 (*) 0.5 - 2.2 mmol/L   CBC     Status: Abnormal   Collection Time   04/23/12  3:26 AM      Component Value Range Comment   WBC 10.1  4.0 - 10.5  K/uL    RBC 1.69 (*) 3.87 - 5.11 MIL/uL    Hemoglobin 5.0 (*) 12.0 - 15.0 g/dL     HCT 65.7 (*) 84.6 - 46.0 %    MCV 89.3  78.0 - 100.0 fL    MCH 29.6  26.0 - 34.0 pg    MCHC 33.1  30.0 - 36.0 g/dL    RDW 96.2  95.2 - 84.1 %    Platelets 91 (*) 150 - 400 K/uL DELTA CHECK NOTED  PRO B NATRIURETIC PEPTIDE     Status: Normal   Collection Time   04/23/12  3:26 AM      Component Value Range Comment   Pro B Natriuretic peptide (BNP) 190.9  0 - 450 pg/mL   TROPONIN I     Status: Normal   Collection Time   04/23/12  3:26 AM      Component Value Range Comment   Troponin I <0.30  <0.30 ng/mL   CBC WITH DIFFERENTIAL     Status: Abnormal   Collection Time   04/23/12 10:34 AM      Component Value Range Comment   WBC 10.9 (*) 4.0 - 10.5 K/uL    RBC 2.70 (*) 3.87 - 5.11 MIL/uL    Hemoglobin 7.8 (*) 12.0 - 15.0 g/dL DELTA CHECK NOTED   HCT 23.1 (*) 36.0 - 46.0 %    MCV 85.6  78.0 - 100.0 fL    MCH 28.9  26.0 - 34.0 pg    MCHC 33.8  30.0 - 36.0 g/dL    RDW 32.4  40.1 - 02.7 %    Platelets 80 (*) 150 - 400 K/uL    Neutrophils Relative 80 (*) 43 - 77 %    Neutro Abs 8.7 (*) 1.7 - 7.7 K/uL    Lymphocytes Relative 5 (*) 12 - 46 %    Lymphs Abs 0.5 (*) 0.7 - 4.0 K/uL    Monocytes Relative 15 (*) 3 - 12 %    Monocytes Absolute 1.6 (*) 0.1 - 1.0 K/uL    Eosinophils Relative 0  0 - 5 %    Eosinophils Absolute 0.0  0.0 - 0.7 K/uL    Basophils Relative 0  0 - 1 %    Basophils Absolute 0.0  0.0 - 0.1 K/uL   CBC     Status: Abnormal   Collection Time   04/23/12  5:22 PM      Component Value Range Comment   WBC 14.2 (*) 4.0 - 10.5 K/uL    RBC 3.17 (*) 3.87 - 5.11 MIL/uL    Hemoglobin 9.4 (*) 12.0 - 15.0 g/dL DELTA CHECK NOTED   HCT 27.3 (*) 36.0 - 46.0 %    MCV 86.1  78.0 - 100.0 fL    MCH 29.7  26.0 - 34.0 pg    MCHC 34.4  30.0 - 36.0 g/dL    RDW 25.3  66.4 - 40.3 %    Platelets 92 (*) 150 - 400 K/uL   CBC     Status: Abnormal   Collection Time   04/23/12  9:25 PM      Component Value Range Comment   WBC 13.8 (*) 4.0 - 10.5 K/uL    RBC 2.95 (*) 3.87 - 5.11 MIL/uL     Hemoglobin 8.8 (*) 12.0 - 15.0 g/dL    HCT 47.4 (*) 25.9 - 46.0 %    MCV 85.8  78.0 - 100.0 fL    MCH 29.8  26.0 -  34.0 pg    MCHC 34.8  30.0 - 36.0 g/dL    RDW 16.1  09.6 - 04.5 %    Platelets 91 (*) 150 - 400 K/uL   CBC     Status: Abnormal   Collection Time   04/24/12  5:13 AM      Component Value Range Comment   WBC 10.9 (*) 4.0 - 10.5 K/uL    RBC 2.74 (*) 3.87 - 5.11 MIL/uL    Hemoglobin 8.0 (*) 12.0 - 15.0 g/dL    HCT 40.9 (*) 81.1 - 46.0 %    MCV 86.1  78.0 - 100.0 fL    MCH 29.2  26.0 - 34.0 pg    MCHC 33.9  30.0 - 36.0 g/dL    RDW 91.4  78.2 - 95.6 %    Platelets 90 (*) 150 - 400 K/uL   COMPREHENSIVE METABOLIC PANEL     Status: Abnormal   Collection Time   04/24/12  5:13 AM      Component Value Range Comment   Sodium 136  135 - 145 mEq/L    Potassium 4.6  3.5 - 5.1 mEq/L    Chloride 103  96 - 112 mEq/L    CO2 26  19 - 32 mEq/L    Glucose, Bld 111 (*) 70 - 99 mg/dL    BUN 15  6 - 23 mg/dL    Creatinine, Ser 2.13  0.50 - 1.10 mg/dL    Calcium 8.4  8.4 - 08.6 mg/dL    Total Protein 5.4 (*) 6.0 - 8.3 g/dL    Albumin 2.9 (*) 3.5 - 5.2 g/dL    AST 15  0 - 37 U/L    ALT 9  0 - 35 U/L    Alkaline Phosphatase 53  39 - 117 U/L    Total Bilirubin 0.6  0.3 - 1.2 mg/dL    GFR calc non Af Amer 84 (*) >90 mL/min    GFR calc Af Amer >90  >90 mL/min   PROTIME-INR     Status: Normal   Collection Time   04/24/12  5:13 AM      Component Value Range Comment   Prothrombin Time 15.2  11.6 - 15.2 seconds    INR 1.22  0.00 - 1.49   APTT     Status: Normal   Collection Time   04/24/12  5:13 AM      Component Value Range Comment   aPTT 30  24 - 37 seconds     Ct Abdomen Pelvis Wo Contrast  04/22/2012  *RADIOLOGY REPORT*  Clinical Data: Abdominal pain.  Hypotension.Pelvic fractures.  CT ABDOMEN AND PELVIS WITHOUT CONTRAST  Technique:  Multidetector CT imaging of the abdomen and pelvis was performed following the standard protocol without intravenous contrast.  Comparison: Radiographs  dated at 04/21/2012 and CT scan dated 05/31/2011  Findings: The patient has a large right-sided pelvic hematoma as well as extensive retroperitoneal hemorrhage and hemorrhage extending into the anterior abdominal wall along both rectus muscles almost to the level of the umbilicus.  The right pelvic hematoma measures approximately 16 x 10 x 10 cm and has a mass effect upon the bladder.  Retroperitoneal hemorrhage extends in both pericolic gutters and in the posterior pararenal spaces.  There is a small amount of fluid over the right lobe of the liver which may be hemorrhage or ascites.  The liver, spleen, pancreas, adrenal glands, and kidneys demonstrate no significant abnormalities.  The bowel is normal  except for sigmoid diverticulosis.  Again noted are the fractures of the left inferior superior pubic rami as well as both the left side of the pubic bone.  The hemorrhage appears to originate from the left pubic body fracture.  IMPRESSION:  Pelvic fractures with a large pelvic hematoma as well as retroperitoneal hemorrhage and hemorrhage into the anterior abdominal wall and extending superiorly in the posterior pararenal spaces.  Critical Value/emergent results were called by telephone at the time of interpretation on 04/22/2012 at 8:45 p.m. to Dr. Orvan Falconer, who verbally acknowledged these results.   Original Report Authenticated By: Francene Boyers, M.D.    Dg Chest Port 1 View  04/23/2012  *RADIOLOGY REPORT*  Clinical Data: Line placement  PORTABLE CHEST - 1 VIEW  Comparison: Earlier today at 5:10 hours  Findings: Right-sided PICC line which is difficult to follow centrally.  Followed to at least the level of the low SVC. No pneumothorax.  Borderline cardiomegaly.  No pleural fluid. Clear lungs.  IMPRESSION: Right-sided PICC line which is difficult to follow centrally. Followed to at least the level of the low SVC.  Consider retraction 2.6 cm with repeat radiograph.   Original Report Authenticated By: Jeronimo Greaves, M.D.    Dg Chest Port 1 View  04/23/2012  *RADIOLOGY REPORT*  Clinical Data: Tachycardia.  PORTABLE CHEST - 1 VIEW  Comparison: Chest radiograph performed 04/22/2012  Findings: The lungs are well-aerated and clear.  There is no evidence of focal opacification, pleural effusion or pneumothorax.  The cardiomediastinal silhouette is within normal limits.  Mild apparent right hilar prominence is stable from prior studies.  No acute osseous abnormalities are seen.  IMPRESSION: No acute cardiopulmonary process seen.   Original Report Authenticated By: Tonia Ghent, M.D.    Dg Chest Port 1 View  04/22/2012  *RADIOLOGY REPORT*  Clinical Data: Hypotension  PORTABLE CHEST - 1 VIEW  Comparison: 06/12/2011  Findings: Lungs are clear.  Negative for pneumonia.  Negative for heart failure or effusion.  Heart size is normal.  IMPRESSION: No acute abnormality.   Original Report Authenticated By: Janeece Riggers, M.D.    Dg Chest Port 1v Same Day  04/23/2012  *RADIOLOGY REPORT*  Clinical Data: Repositioning of PICC line.  PORTABLE CHEST - 1 VIEW SAME DAY  Comparison: Earlier today at 0842 hours.  Findings: 0923 hours.  Right-sided PICC line terminates at the low SVC. No pneumothorax.  Borderline cardiomegaly. No pleural fluid.  Clear lungs.  IMPRESSION: Right-sided PICC line terminating at the low SVC.  No pneumothorax or other acute cardiopulmonary process.   Original Report Authenticated By: Jeronimo Greaves, M.D.     Review of Systems  Constitutional: Positive for malaise/fatigue. Negative for fever and chills.  Eyes: Positive for blurred vision.  Respiratory: Negative for sputum production.   Cardiovascular: Negative for chest pain.  Gastrointestinal: Positive for nausea, vomiting and abdominal pain.  Genitourinary: Negative for urgency and flank pain.  Musculoskeletal: Positive for joint pain and falls.  Skin: Negative for itching and rash.  Neurological: Positive for dizziness. Negative for headaches.   Endo/Heme/Allergies: Bruises/bleeds easily.  Psychiatric/Behavioral: Positive for depression.   Blood pressure 100/66, pulse 89, temperature 98.1 F (36.7 C), temperature source Axillary, resp. rate 22, height 5\' 4"  (1.626 m), weight 140 lb (63.504 kg), SpO2 97.00%. Physical Exam  Constitutional: She appears well-developed and well-nourished. No distress.  HENT:  Head: Normocephalic.  Eyes: Right eye exhibits no discharge. Left eye exhibits no discharge. No scleral icterus.  Neck: Neck supple.  Cardiovascular:  Intact distal pulses.   Respiratory: No respiratory distress.  GI: She exhibits distension. There is tenderness. There is no rebound.  Musculoskeletal:       Right hip: Normal.       Left hip: Normal.       Right knee: Normal.       Left knee: Normal.       Right ankle: Normal.       Left ankle: Normal.       Upper extremity exam  The right and left upper extremity:  Inspection and palpation revealed no abnormalities in the upper extremities.  Range of motion is full without contracture. Motor exam is normal with grade 5 strength. The joints are fully reduced without subluxation. There is no atrophy or tremor and muscle tone is normal.  All joints are stable.      Skin: Skin is warm and dry. She is not diaphoretic.  Psychiatric: She has a normal mood and affect. Her behavior is normal. Thought content normal.   Pelvic exam stable with tenderness near and around symphysis pubis  Assessment/Plan: Left pubic rami fractures, superior and inferior. At risk for constipation, atelectasis and urinary retention, bleeding.  However todays coags are normal  Her admitting hg was higher than expected and I suspect some volume contraction.  Transfuse as needed Monitor coags daily Bed rest today ok to perform in bed exercises  Asking for general surgery consult  Also PLTS 80 K , MAY NEED TO TRANSFUSE THOSE AS WELL???      Fuller Canada 04/24/2012, 8:11 AM

## 2012-04-24 NOTE — Progress Notes (Signed)
[facility] MD called regarding BP being low. Recommended we hold diltizem and contact Hospitalist. Contacted Dr. Orvan Falconer regarding hypotension and he wanted to get CBC results back before ordering a bolus. Texted paged Dr. Orvan Falconer the Hgb results of 9.0. No new orders at this time.

## 2012-04-24 NOTE — Progress Notes (Signed)
Physical Therapy Treatment Patient Details Name: MILLEY VINING MRN: [id number] DOB: 1925/08/01 Today's Date: 04/24/2012 Time: 7829-5621 PT Time Calculation (min): 36 min Charges:  therex 18'  PT Assessment / Plan / Recommendation Comments on Treatment Session  Pt. able to complete all bed activities without c/o pain or discomfort.  Pt. required rests between as well as VC's for breathing techniques due to SOB and elevated HR at times 90-110bpm.  Dr. Sherrie Mustache with pt. during treatment X 15' examining pt.   Per MD, bed exercises only.                    Plan Discharge plan remains appropriate    Precautions / Restrictions Precautions Precautions: Fall;Other (comment) Precaution Comments: bed rest per Dr. Romeo Apple; bed exercises only due to active bleeding Restrictions Weight Bearing Restrictions: Yes    Mobility  Not completed today per MD orders    Exercises General Exercises - Lower Extremity Ankle Circles/Pumps: AROM;Both;10 reps;Supine Quad Sets: AROM;Both;10 reps;Supine Gluteal Sets: AROM;Both;10 reps;Supine Short Arc Quad: AROM;Both;10 reps;Supine Heel Slides: AAROM;Both;10 reps;Supine Hip ABduction/ADduction: AAROM;Both;10 reps;Supine   PT Goals    Visit Information  Last PT Received On: 04/24/12    Subjective Data  Subjective: Pt. reports she is feeling much better today.  Daughter states she has had 3 units of blood that has made her feel better.  New orders per Dr. Romeo Apple for bed exercises only.   Cognition  Overall Cognitive Status: Appears within functional limits for tasks assessed/performed Arousal/Alertness: Awake/alert Orientation Level: Appears intact for tasks assessed Behavior During Session: [facility] for tasks performed       End of Session PT - End of Session Activity Tolerance: Patient limited by pain;Patient limited by fatigue (limited by syncope and nausea) Patient left: with call bell/phone within reach;with family/visitor present;in bed       Lurena Nida, PTA/CLT 04/24/2012, 10:09 AM

## 2012-04-24 NOTE — Plan of Care (Signed)
Problem: Consults Goal: General Medical Patient Education See Patient Education Module for specific education.  Outcome: Progressing 4 units FFP's and 3 units PRBC's  On Monday.  Family aware of frequent monitoring of Hemoglobin. Ecchymosis noted to supra pubic area.  Continues to complain of lower abdominal pain but refuses pain medication.  Pain observed on turning only. Goal: Skin Care Protocol Initiated - if Braden Score 18 or less If consults are not indicated, leave blank or document N/A  Outcome: Progressing Bath given tonight sacrum clear of any redness or breakdown, again ecchymosis to bilat fa's and suprapubic area Goal: Nutrition Consult-if indicated Outcome: Progressing Remains on clear liquids only  Problem: Phase I Progression Outcomes Goal: OOB as tolerated unless otherwise ordered Outcome: Progressing Dr. Romeo Apple would like her to get oob as tolerated.  Need a new order for PT when sure hemorrhaging has stopped Goal: Initial discharge plan identified Outcome: Progressing Social work looking into skilled placement at discharge Goal: Hemodynamically stable Outcome: Not Progressing BP remains soft.  Daughter assures me that she has a low bp normally

## 2012-04-24 NOTE — Progress Notes (Signed)
Dr Sherrie Mustache on floor for rounds. Verbal order to wait to hang second unit of blood that was ordered this am unit H/H is back. Dr Sherrie Mustache to be called and made aware of H/H results.

## 2012-04-24 NOTE — Consult Note (Signed)
Reason for Consult: Pelvic and retroperitoneal hematoma Referring Physician: [facility], Dr. Fuller Canada  Deborah [name] is an 78 y.o. female.  HPI: Patient is an 78 year old white female who presented with a fracture of her superior and inferior pubic ramus on the left as well as another fracture on the right. She was on Xerolta for chronic atrial fibrillation. A CT scan of the abdomen and pelvis was done yesterday evening do to a drop in her hemoglobin which showed a large pelvic and retroperitoneal hematoma. She has received fresh frozen plasma as well as multiple packed red blood cells. She was hypotensive, but appears to be more stable at this point.  Past Medical History  Diagnosis Date  . Decreased left ventricular function     EF normalized by Lexiscan 2010: 69%  . Chronic diastolic heart failure   . A-fib     permanent; rate controlled   . COPD (chronic obstructive pulmonary disease)   . Drug intolerance     Flecacinide and Amiodarone   . CHF (congestive heart failure)   . Asthma   . Umbilical hernia   . Coronary artery disease     status post bare-metal stent to the circumflex coronary artery2009  . Chronic anticoagulation     on dabigatran with no complications  . Seizures     Remote history of over 20 years ago  . Anemia due to blood loss, acute 04/23/2012  . Permanent atrial fibrillation 02/10/2009    Qualifier: Diagnosis of  By: Shaune Leeks      Past Surgical History  Procedure Date  . Radiofrequency catheter ablation     failed  . Right carpel tunnel release   . Cataract extraction   . Tonsillectomy     Family History  Problem Relation Age of Onset  . Coronary artery disease Mother     fhx     Social History:  reports that she quit smoking about 21 years ago. Her smoking use included Cigarettes. She has a 32 pack-year smoking history. She has never used smokeless tobacco. She reports that she does not drink alcohol or use illicit  drugs.  Allergies:  Allergies  Allergen Reactions  . Omnipaque (Iohexol) Shortness Of Breath and Other (See Comments)    Short of breath with chest tightness after IV injection in CT, pt was fine when she left department but developed symptoms in parking lot and went to emergency department.  . Carbamazepine     REACTION: toxemia  . Tramadol Other (See Comments)    Unknown     Medications: I have reviewed the patient's current medications.  Results for orders placed during the hospital encounter of 04/21/12 (from the past 48 hour(s))  HEMOGLOBIN AND HEMATOCRIT, BLOOD     Status: Abnormal   Collection Time   04/22/12  1:00 PM      Component Value Range Comment   Hemoglobin 10.3 (*) 12.0 - 15.0 g/dL    HCT 40.9 (*) 81.1 - 46.0 %   LACTIC ACID, PLASMA     Status: Abnormal   Collection Time   04/22/12  1:20 PM      Component Value Range Comment   Lactic Acid, Venous 4.7 (*) 0.5 - 2.2 mmol/L   CORTISOL     Status: Normal   Collection Time   04/22/12  1:20 PM      Component Value Range Comment   Cortisol, Plasma 34.7     TSH     Status: Normal  Collection Time   04/22/12  1:20 PM      Component Value Range Comment   TSH 2.031  0.350 - 4.500 uIU/mL   URINALYSIS, ROUTINE W REFLEX MICROSCOPIC     Status: Abnormal   Collection Time   04/22/12  4:25 PM      Component Value Range Comment   Color, Urine YELLOW  YELLOW    APPearance CLEAR  CLEAR    Specific Gravity, Urine >1.030 (*) 1.005 - 1.030    pH 6.0  5.0 - 8.0    Glucose, UA NEGATIVE  NEGATIVE mg/dL    Hgb urine dipstick NEGATIVE  NEGATIVE    Bilirubin Urine NEGATIVE  NEGATIVE    Ketones, ur TRACE (*) NEGATIVE mg/dL    Protein, ur NEGATIVE  NEGATIVE mg/dL    Urobilinogen, UA 0.2  0.0 - 1.0 mg/dL    Nitrite NEGATIVE  NEGATIVE    Leukocytes, UA NEGATIVE  NEGATIVE MICROSCOPIC NOT DONE ON URINES WITH NEGATIVE PROTEIN, BLOOD, LEUKOCYTES, NITRITE, OR GLUCOSE <1000 mg/dL.  TYPE AND SCREEN     Status: Normal (Preliminary result)    Collection Time   04/22/12  9:30 PM      Component Value Range Comment   ABO/RH(D) A POS      Antibody Screen NEG      Sample Expiration 04/25/2012      Unit Number [id number]      Blood Component Type RED CELLS,LR      Unit division 00      Status of Unit ISSUED,FINAL      Transfusion Status OK TO TRANSFUSE      Crossmatch Result Compatible      Unit Number [id number]      Blood Component Type RED CELLS,LR      Unit division 00      Status of Unit ISSUED,FINAL      Transfusion Status OK TO TRANSFUSE      Crossmatch Result Compatible      Unit Number [id number]      Blood Component Type RED CELLS,LR      Unit division 00      Status of Unit ISSUED,FINAL      Transfusion Status OK TO TRANSFUSE      Crossmatch Result Compatible      Unit Number [id number]      Blood Component Type RED CELLS,LR      Unit division 00      Status of Unit ISSUED      Transfusion Status OK TO TRANSFUSE      Crossmatch Result Compatible      Unit Number [id number]      Blood Component Type RED CELLS,LR      Unit division 00      Status of Unit ALLOCATED      Transfusion Status OK TO TRANSFUSE      Crossmatch Result Compatible      Unit Number [id number]      Blood Component Type RED CELLS,LR      Unit division 00      Status of Unit ALLOCATED      Transfusion Status OK TO TRANSFUSE      Crossmatch Result Compatible      Unit Number [id number]      Blood Component Type RED CELLS,LR      Unit division 00      Status of Unit ALLOCATED      Transfusion Status OK TO TRANSFUSE      Crossmatch Result  Compatible     ABO/RH     Status: Normal   Collection Time   04/22/12  9:30 PM      Component Value Range Comment   ABO/RH(D) A POS     PREPARE RBC (CROSSMATCH)     Status: Normal   Collection Time   04/22/12  9:30 PM      Component Value Range Comment   Order Confirmation ORDER PROCESSED BY BLOOD BANK     CBC     Status: Abnormal   Collection Time   04/22/12  9:31 PM       Component Value Range Comment   WBC 16.2 (*) 4.0 - 10.5 K/uL    RBC 2.89 (*) 3.87 - 5.11 MIL/uL    Hemoglobin 8.5 (*) 12.0 - 15.0 g/dL DELTA CHECK NOTED   HCT 26.0 (*) 36.0 - 46.0 %    MCV 90.0  78.0 - 100.0 fL    MCH 29.4  26.0 - 34.0 pg    MCHC 32.7  30.0 - 36.0 g/dL    RDW 29.5  62.1 - 30.8 %    Platelets 171  150 - 400 K/uL   PROTIME-INR     Status: Abnormal   Collection Time   04/22/12  9:31 PM      Component Value Range Comment   Prothrombin Time 28.7 (*) 11.6 - 15.2 seconds    INR 2.88 (*) 0.00 - 1.49   APTT     Status: Abnormal   Collection Time   04/22/12  9:31 PM      Component Value Range Comment   aPTT 38 (*) 24 - 37 seconds   PREPARE FRESH FROZEN PLASMA     Status: Normal   Collection Time   04/22/12  9:31 PM      Component Value Range Comment   Unit Number [id number]      Blood Component Type THAWED PLASMA      Unit division 00      Status of Unit ISSUED,FINAL      Transfusion Status OK TO TRANSFUSE      Unit Number [id number]      Blood Component Type THAWED PLASMA      Unit division 00      Status of Unit ISSUED,FINAL      Transfusion Status OK TO TRANSFUSE      Unit Number [id number]      Blood Component Type THAWED PLASMA      Unit division 00      Status of Unit ISSUED,FINAL      Transfusion Status OK TO TRANSFUSE      Unit Number [id number]      Blood Component Type THAWED PLASMA      Unit division 00      Status of Unit ISSUED,FINAL      Transfusion Status OK TO TRANSFUSE     PREPARE RBC (CROSSMATCH)     Status: Normal   Collection Time   04/22/12  9:31 PM      Component Value Range Comment   Order Confirmation ORDER PROCESSED BY BLOOD BANK     MRSA PCR SCREENING     Status: Normal   Collection Time   04/22/12  9:53 PM      Component Value Range Comment   MRSA by PCR NEGATIVE  NEGATIVE   LACTIC ACID, PLASMA     Status: Abnormal   Collection Time   04/23/12  3:26 AM      Component Value  Range Comment   Lactic Acid, Venous 3.7 (*) 0.5 - 2.2  mmol/L   CBC     Status: Abnormal   Collection Time   04/23/12  3:26 AM      Component Value Range Comment   WBC 10.1  4.0 - 10.5 K/uL    RBC 1.69 (*) 3.87 - 5.11 MIL/uL    Hemoglobin 5.0 (*) 12.0 - 15.0 g/dL    HCT 21.3 (*) 08.6 - 46.0 %    MCV 89.3  78.0 - 100.0 fL    MCH 29.6  26.0 - 34.0 pg    MCHC 33.1  30.0 - 36.0 g/dL    RDW 57.8  46.9 - 62.9 %    Platelets 91 (*) 150 - 400 K/uL DELTA CHECK NOTED  PRO B NATRIURETIC PEPTIDE     Status: Normal   Collection Time   04/23/12  3:26 AM      Component Value Range Comment   Pro B Natriuretic peptide (BNP) 190.9  0 - 450 pg/mL   TROPONIN I     Status: Normal   Collection Time   04/23/12  3:26 AM      Component Value Range Comment   Troponin I <0.30  <0.30 ng/mL   CBC WITH DIFFERENTIAL     Status: Abnormal   Collection Time   04/23/12 10:34 AM      Component Value Range Comment   WBC 10.9 (*) 4.0 - 10.5 K/uL    RBC 2.70 (*) 3.87 - 5.11 MIL/uL    Hemoglobin 7.8 (*) 12.0 - 15.0 g/dL DELTA CHECK NOTED   HCT 23.1 (*) 36.0 - 46.0 %    MCV 85.6  78.0 - 100.0 fL    MCH 28.9  26.0 - 34.0 pg    MCHC 33.8  30.0 - 36.0 g/dL    RDW 52.8  41.3 - 24.4 %    Platelets 80 (*) 150 - 400 K/uL    Neutrophils Relative 80 (*) 43 - 77 %    Neutro Abs 8.7 (*) 1.7 - 7.7 K/uL    Lymphocytes Relative 5 (*) 12 - 46 %    Lymphs Abs 0.5 (*) 0.7 - 4.0 K/uL    Monocytes Relative 15 (*) 3 - 12 %    Monocytes Absolute 1.6 (*) 0.1 - 1.0 K/uL    Eosinophils Relative 0  0 - 5 %    Eosinophils Absolute 0.0  0.0 - 0.7 K/uL    Basophils Relative 0  0 - 1 %    Basophils Absolute 0.0  0.0 - 0.1 K/uL   CBC     Status: Abnormal   Collection Time   04/23/12  5:22 PM      Component Value Range Comment   WBC 14.2 (*) 4.0 - 10.5 K/uL    RBC 3.17 (*) 3.87 - 5.11 MIL/uL    Hemoglobin 9.4 (*) 12.0 - 15.0 g/dL DELTA CHECK NOTED   HCT 27.3 (*) 36.0 - 46.0 %    MCV 86.1  78.0 - 100.0 fL    MCH 29.7  26.0 - 34.0 pg    MCHC 34.4  30.0 - 36.0 g/dL    RDW 01.0  27.2 - 53.6 %     Platelets 92 (*) 150 - 400 K/uL   CBC     Status: Abnormal   Collection Time   04/23/12  9:25 PM      Component Value Range Comment   WBC 13.8 (*) 4.0 - 10.5  K/uL    RBC 2.95 (*) 3.87 - 5.11 MIL/uL    Hemoglobin 8.8 (*) 12.0 - 15.0 g/dL    HCT 16.1 (*) 09.6 - 46.0 %    MCV 85.8  78.0 - 100.0 fL    MCH 29.8  26.0 - 34.0 pg    MCHC 34.8  30.0 - 36.0 g/dL    RDW 04.5  40.9 - 81.1 %    Platelets 91 (*) 150 - 400 K/uL   CBC     Status: Abnormal   Collection Time   04/24/12  5:13 AM      Component Value Range Comment   WBC 10.9 (*) 4.0 - 10.5 K/uL    RBC 2.74 (*) 3.87 - 5.11 MIL/uL    Hemoglobin 8.0 (*) 12.0 - 15.0 g/dL    HCT 91.4 (*) 78.2 - 46.0 %    MCV 86.1  78.0 - 100.0 fL    MCH 29.2  26.0 - 34.0 pg    MCHC 33.9  30.0 - 36.0 g/dL    RDW 95.6  21.3 - 08.6 %    Platelets 90 (*) 150 - 400 K/uL   COMPREHENSIVE METABOLIC PANEL     Status: Abnormal   Collection Time   04/24/12  5:13 AM      Component Value Range Comment   Sodium 136  135 - 145 mEq/L    Potassium 4.6  3.5 - 5.1 mEq/L    Chloride 103  96 - 112 mEq/L    CO2 26  19 - 32 mEq/L    Glucose, Bld 111 (*) 70 - 99 mg/dL    BUN 15  6 - 23 mg/dL    Creatinine, Ser 5.78  0.50 - 1.10 mg/dL    Calcium 8.4  8.4 - 46.9 mg/dL    Total Protein 5.4 (*) 6.0 - 8.3 g/dL    Albumin 2.9 (*) 3.5 - 5.2 g/dL    AST 15  0 - 37 U/L    ALT 9  0 - 35 U/L    Alkaline Phosphatase 53  39 - 117 U/L    Total Bilirubin 0.6  0.3 - 1.2 mg/dL    GFR calc non Af Amer 84 (*) >90 mL/min    GFR calc Af Amer >90  >90 mL/min   PROTIME-INR     Status: Normal   Collection Time   04/24/12  5:13 AM      Component Value Range Comment   Prothrombin Time 15.2  11.6 - 15.2 seconds    INR 1.22  0.00 - 1.49   APTT     Status: Normal   Collection Time   04/24/12  5:13 AM      Component Value Range Comment   aPTT 30  24 - 37 seconds   PREPARE RBC (CROSSMATCH)     Status: Normal   Collection Time   04/24/12 10:00 AM      Component Value Range Comment    Order Confirmation ORDER PROCESSED BY BLOOD BANK       Ct Abdomen Pelvis Wo Contrast  04/22/2012  *RADIOLOGY REPORT*  Clinical Data: Abdominal pain.  Hypotension.Pelvic fractures.  CT ABDOMEN AND PELVIS WITHOUT CONTRAST  Technique:  Multidetector CT imaging of the abdomen and pelvis was performed following the standard protocol without intravenous contrast.  Comparison: Radiographs dated at 04/21/2012 and CT scan dated 05/31/2011  Findings: The patient has a large right-sided pelvic hematoma as well as extensive retroperitoneal hemorrhage and hemorrhage extending into  the anterior abdominal wall along both rectus muscles almost to the level of the umbilicus.  The right pelvic hematoma measures approximately 16 x 10 x 10 cm and has a mass effect upon the bladder.  Retroperitoneal hemorrhage extends in both pericolic gutters and in the posterior pararenal spaces.  There is a small amount of fluid over the right lobe of the liver which may be hemorrhage or ascites.  The liver, spleen, pancreas, adrenal glands, and kidneys demonstrate no significant abnormalities.  The bowel is normal except for sigmoid diverticulosis.  Again noted are the fractures of the left inferior superior pubic rami as well as both the left side of the pubic bone.  The hemorrhage appears to originate from the left pubic body fracture.  IMPRESSION:  Pelvic fractures with a large pelvic hematoma as well as retroperitoneal hemorrhage and hemorrhage into the anterior abdominal wall and extending superiorly in the posterior pararenal spaces.  Critical Value/emergent results were called by telephone at the time of interpretation on 04/22/2012 at 8:45 p.m. to Dr. Orvan Falconer, who verbally acknowledged these results.   Original Report Authenticated By: Francene Boyers, M.D.    Dg Chest Port 1 View  04/23/2012  *RADIOLOGY REPORT*  Clinical Data: Line placement  PORTABLE CHEST - 1 VIEW  Comparison: Earlier today at 5:10 hours  Findings: Right-sided PICC  line which is difficult to follow centrally.  Followed to at least the level of the low SVC. No pneumothorax.  Borderline cardiomegaly.  No pleural fluid. Clear lungs.  IMPRESSION: Right-sided PICC line which is difficult to follow centrally. Followed to at least the level of the low SVC.  Consider retraction 2.6 cm with repeat radiograph.   Original Report Authenticated By: Jeronimo Greaves, M.D.    Dg Chest Port 1 View  04/23/2012  *RADIOLOGY REPORT*  Clinical Data: Tachycardia.  PORTABLE CHEST - 1 VIEW  Comparison: Chest radiograph performed 04/22/2012  Findings: The lungs are well-aerated and clear.  There is no evidence of focal opacification, pleural effusion or pneumothorax.  The cardiomediastinal silhouette is within normal limits.  Mild apparent right hilar prominence is stable from prior studies.  No acute osseous abnormalities are seen.  IMPRESSION: No acute cardiopulmonary process seen.   Original Report Authenticated By: Tonia Ghent, M.D.    Dg Chest Port 1 View  04/22/2012  *RADIOLOGY REPORT*  Clinical Data: Hypotension  PORTABLE CHEST - 1 VIEW  Comparison: 06/12/2011  Findings: Lungs are clear.  Negative for pneumonia.  Negative for heart failure or effusion.  Heart size is normal.  IMPRESSION: No acute abnormality.   Original Report Authenticated By: Janeece Riggers, M.D.    Dg Chest Port 1v Same Day  04/23/2012  *RADIOLOGY REPORT*  Clinical Data: Repositioning of PICC line.  PORTABLE CHEST - 1 VIEW SAME DAY  Comparison: Earlier today at 0842 hours.  Findings: 0923 hours.  Right-sided PICC line terminates at the low SVC. No pneumothorax.  Borderline cardiomegaly. No pleural fluid.  Clear lungs.  IMPRESSION: Right-sided PICC line terminating at the low SVC.  No pneumothorax or other acute cardiopulmonary process.   Original Report Authenticated By: Jeronimo Greaves, M.D.     ROS: See chart Blood pressure 101/70, pulse 88, temperature 98.3 F (36.8 C), temperature source Oral, resp. rate 22, height  5\' 4"  (1.626 m), weight 63.504 kg (140 lb), SpO2 97.00%. Physical Exam: Pleasant white female in no acute distress. Abdomen: Soft with mild distention noted. She is tender in the suprapubic and lower quadrant regions. Ecchymosis is noted  along the right flank. Minimal bowel sounds appreciated. No rigidity noted.   Assessment/Plan: Pelvic and retroperitoneal hematomas secondary to pelvic fracture, chronic anticoagulation. From a general surgery standpoint, there is nothing for me to offer. She's not a candidate for interventional radiology and is definitely not in need of exploration. Would start sequential compression stockings. Support hemoglobin for hemodynamic instability. Thrombocytopenia most likely secondary to consumption. No need for platelet transfusion at this point. We'll follow peripherally with you. This was discussed with the patient, who understands. All questions were answered.  Mattea Seger A 04/24/2012, 10:25 AM

## 2012-04-24 NOTE — Progress Notes (Signed)
Dr Sherrie Mustache called and made aware of repeat H/H of 9.7/28. Order received to not transfuse the second unit of blood that was ordered for today. Pt updated. VS stable, pt resting, no complaints. Will continue to monitor.

## 2012-04-24 NOTE — Progress Notes (Signed)
Dr Lovell Sheehan paged and made aware of consult at 0800.

## 2012-04-24 NOTE — Progress Notes (Signed)
Subjective: The patient is lying in bed. She says that her abdomen is sore. She has not had a bowel movement in several days. Overall, she says that she feels better.  Objective: Vital signs in last 24 hours: Filed Vitals:   04/24/12 0945 04/24/12 1000 04/24/12 1015 04/24/12 1030  BP: 98/81 85/64 101/52 99/60  Pulse: 101 170 106 102  Temp:      TempSrc:      Resp: 21 22 20 19   Height:      Weight:      SpO2: 100% 99% 100% 99%    Intake/Output Summary (Last 24 hours) at 04/24/12 1041 Last data filed at 04/24/12 1000  Gross per 24 hour  Intake 2788.75 ml  Output    200 ml  Net 2588.75 ml    Weight change:   Physical exam: General: Pleasant alert 78 year old Caucasian woman laying in bed, in no acute distress. Lungs: Decreased breath sounds in the bases; very faint expiratory wheezes mostly upper airway.. Breathing is mildly labored with speaking. Heart: Irregular, irregular, with borderline tachycardia. Abdomen/pelvis: Firm, positive bowel sounds, moderately to exquisitely tender to palpation diffusely; no bruit. Extremities: No pedal edema. Neurologic: She is alert and oriented x2. Cranial nerves II through XII are grossly intact.  Lab Results: Basic Metabolic Panel:  Basename 04/24/12 0513 04/22/12 0149 04/21/12 1920  NA 136 140 --  K 4.6 4.3 --  CL 103 104 --  CO2 26 28 --  GLUCOSE 111* 155* --  BUN 15 16 --  CREATININE 0.55 0.66 --  CALCIUM 8.4 9.2 --  MG -- -- 2.1  PHOS -- -- --   Liver Function Tests:  [facility] 04/24/12 0513 04/21/12 1920  AST 15 21  ALT 9 13  ALKPHOS 53 64  BILITOT 0.6 0.2*  PROT 5.4* 6.2  ALBUMIN 2.9* 3.7   No results found for this basename: LIPASE:2,AMYLASE:2 in the last 72 hours No results found for this basename: AMMONIA:2 in the last 72 hours CBC:  Basename 04/24/12 1011 04/24/12 0513 04/23/12 1034 04/21/12 1920  WBC 11.0* 10.9* -- --  NEUTROABS -- -- 8.7* 8.7*  HGB 9.7* 8.0* -- --  HCT 28.0* 23.6* -- --  MCV 85.9 86.1  -- --  PLT 89* 90* -- --   Cardiac Enzymes:  Basename 04/23/12 0326 04/22/12 0811 04/22/12 0149  CKTOTAL -- -- --  CKMB -- -- --  CKMBINDEX -- -- --  TROPONINI <0.30 <0.30 <0.30   BNP:  Basename 04/23/12 0326  PROBNP 190.9   D-Dimer: No results found for this basename: DDIMER:2 in the last 72 hours CBG: No results found for this basename: GLUCAP:6 in the last 72 hours Hemoglobin A1C:  Basename 04/21/12 1920  HGBA1C 5.7*   Fasting Lipid Panel: No results found for this basename: CHOL,HDL,LDLCALC,TRIG,CHOLHDL,LDLDIRECT in the last 72 hours Thyroid Function Tests:  Basename 04/22/12 1320  TSH 2.031  T4TOTAL --  FREET4 --  T3FREE --  THYROIDAB --   Anemia Panel: No results found for this basename: VITAMINB12,FOLATE,FERRITIN,TIBC,IRON,RETICCTPCT in the last 72 hours Coagulation:  Basename 04/24/12 0513 04/22/12 2131  LABPROT 15.2 28.7*  INR 1.22 2.88*   Urine Drug Screen: Drugs of Abuse  No results found for this basename: labopia,  cocainscrnur,  labbenz,  amphetmu,  thcu,  labbarb    Alcohol Level: No results found for this basename: ETH:2 in the last 72 hours Urinalysis:  Basename 04/22/12 1625  COLORURINE YELLOW  LABSPEC >1.030*  PHURINE 6.0  GLUCOSEU NEGATIVE  HGBUR NEGATIVE  BILIRUBINUR NEGATIVE  KETONESUR TRACE*  PROTEINUR NEGATIVE  UROBILINOGEN 0.2  NITRITE NEGATIVE  LEUKOCYTESUR NEGATIVE   Misc. Labs:   Micro: Recent Results (from the past 240 hour(s))  MRSA PCR SCREENING     Status: Normal   Collection Time   04/22/12  9:53 PM      Component Value Range Status Comment   MRSA by PCR NEGATIVE  NEGATIVE Final     Studies/Results: Ct Abdomen Pelvis Wo Contrast  04/22/2012  *RADIOLOGY REPORT*  Clinical Data: Abdominal pain.  Hypotension.Pelvic fractures.  CT ABDOMEN AND PELVIS WITHOUT CONTRAST  Technique:  Multidetector CT imaging of the abdomen and pelvis was performed following the standard protocol without intravenous contrast.   Comparison: Radiographs dated at 04/21/2012 and CT scan dated 05/31/2011  Findings: The patient has a large right-sided pelvic hematoma as well as extensive retroperitoneal hemorrhage and hemorrhage extending into the anterior abdominal wall along both rectus muscles almost to the level of the umbilicus.  The right pelvic hematoma measures approximately 16 x 10 x 10 cm and has a mass effect upon the bladder.  Retroperitoneal hemorrhage extends in both pericolic gutters and in the posterior pararenal spaces.  There is a small amount of fluid over the right lobe of the liver which may be hemorrhage or ascites.  The liver, spleen, pancreas, adrenal glands, and kidneys demonstrate no significant abnormalities.  The bowel is normal except for sigmoid diverticulosis.  Again noted are the fractures of the left inferior superior pubic rami as well as both the left side of the pubic bone.  The hemorrhage appears to originate from the left pubic body fracture.  IMPRESSION:  Pelvic fractures with a large pelvic hematoma as well as retroperitoneal hemorrhage and hemorrhage into the anterior abdominal wall and extending superiorly in the posterior pararenal spaces.  Critical Value/emergent results were called by telephone at the time of interpretation on 04/22/2012 at 8:45 p.m. to Dr. Orvan Falconer, who verbally acknowledged these results.   Original Report Authenticated By: Francene Boyers, M.D.    Dg Chest Port 1 View  04/23/2012  *RADIOLOGY REPORT*  Clinical Data: Line placement  PORTABLE CHEST - 1 VIEW  Comparison: Earlier today at 5:10 hours  Findings: Right-sided PICC line which is difficult to follow centrally.  Followed to at least the level of the low SVC. No pneumothorax.  Borderline cardiomegaly.  No pleural fluid. Clear lungs.  IMPRESSION: Right-sided PICC line which is difficult to follow centrally. Followed to at least the level of the low SVC.  Consider retraction 2.6 cm with repeat radiograph.   Original Report  Authenticated By: Jeronimo Greaves, M.D.    Dg Chest Port 1 View  04/23/2012  *RADIOLOGY REPORT*  Clinical Data: Tachycardia.  PORTABLE CHEST - 1 VIEW  Comparison: Chest radiograph performed 04/22/2012  Findings: The lungs are well-aerated and clear.  There is no evidence of focal opacification, pleural effusion or pneumothorax.  The cardiomediastinal silhouette is within normal limits.  Mild apparent right hilar prominence is stable from prior studies.  No acute osseous abnormalities are seen.  IMPRESSION: No acute cardiopulmonary process seen.   Original Report Authenticated By: Tonia Ghent, M.D.    Dg Chest Port 1 View  04/22/2012  *RADIOLOGY REPORT*  Clinical Data: Hypotension  PORTABLE CHEST - 1 VIEW  Comparison: 06/12/2011  Findings: Lungs are clear.  Negative for pneumonia.  Negative for heart failure or effusion.  Heart size is normal.  IMPRESSION: No acute abnormality.   Original Report Authenticated  By: Janeece Riggers, M.D.    Dg Chest Port 1v Same Day  04/23/2012  *RADIOLOGY REPORT*  Clinical Data: Repositioning of PICC line.  PORTABLE CHEST - 1 VIEW SAME DAY  Comparison: Earlier today at 0842 hours.  Findings: 0923 hours.  Right-sided PICC line terminates at the low SVC. No pneumothorax.  Borderline cardiomegaly. No pleural fluid.  Clear lungs.  IMPRESSION: Right-sided PICC line terminating at the low SVC.  No pneumothorax or other acute cardiopulmonary process.   Original Report Authenticated By: Jeronimo Greaves, M.D.      2-D echo: Study Conclusions:   - Left ventricle: The cavity size was normal. There was mild concentric hypertrophy. Systolic function was normal. The estimated ejection fraction was in the range of 60% to 65%. Wall motion was normal; there were no regional wall motion abnormalities. - Aortic valve: Mildly calcified annulus. Mildly calcified leaflets. - Mitral valve: Calcified annulus. - Left atrium: The atrium was moderately dilated. - Right atrium: The atrium was mildly  to moderately dilated. - Atrial septum: No defect or patent foramen ovale was identified. Transthoracic echocardiography. M-mode, complete 2D, spectral Doppler, and color Doppler. Height: Height: 162.6cm. Height: 64in. Weight: Weight: 63.5kg. Weight: 139.7lb. Body mass index: BMI: 24kg/m^2. Body surface area: BSA: 1.60m^2. Patient status: Inpatient. Location: ICU/CCU    Medications:  Scheduled:    . acetaminophen  650 mg Oral Once  . digoxin  0.125 mg Oral Daily  . diltiazem  30 mg Oral Q6H  . levalbuterol  0.63 mg Nebulization TID  . levETIRAcetam  125 mg Oral QPM  . levETIRAcetam  250 mg Oral Daily  . pantoprazole  40 mg Oral Q M,W,F  . polyvinyl alcohol  1 drop Both Eyes QID  . potassium chloride  20 mEq Oral Daily  . senna  1 tablet Oral BID  . simvastatin  20 mg Oral q1800  . sodium chloride  10-40 mL Intracatheter Q12H   Continuous:    . 0.9 % NaCl with KCl 20 mEq / L 75 mL/hr at 04/24/12 1000  . [DISCONTINUED] sodium chloride Stopped (04/23/12 1135)   ZOX:[id number], albuterol, bisacodyl, morphine injection, ondansetron (ZOFRAN) IV, ondansetron, oxyCODONE, polyethylene glycol, sodium chloride, traZODone, [DISCONTINUED] acetaminophen  Assessment: Principal Problem:  *Fracture of Left superior and inferior pubic rami Active Problems:  Traumatic retroperitoneal hematoma  Acute post-hemorrhagic anemia  Permanent atrial fibrillation  DIASTOLIC HEART FAILURE, CHRONIC  Chronic anticoagulation  Coronary artery disease  COPD (chronic obstructive pulmonary disease)  Fall  Macular degeneration  RLQ abdominal pain  Hypotension  Syncope    1. Newly diagnosed large pelvic hematoma as well as retroperitoneal hemorrhage and hemorrhage into the anterior abdominal wall in the setting of pelvic fracture as an anti-coagulation. Orthopedic surgeon Dr. Romeo Apple was consulted. His recommendations noted and appreciated. General surgeon, Dr. Lovell Sheehan was consulted. His  recommendations and assessment noted and appreciated. (The patient was discussed with my colleague Dr. Orvan Falconer who discussed the finding with the trauma surgeon on call at [facility]. Apparently, there appears to be no surgical indication at this time). Continue supportive treatment.  Pelvic fractures/acute and mildly displaced fractures of both superior and inferior pubic rami on the left; status post fall at home.. Dr. Romeo Apple has seen and evaluated the patient. His recommendations noted and appreciated. We'll gently restart physical therapy per his recommendations.  Abdominal pain secondary to above. Also, the patient appears to be constipated. She is on Senokot twice daily. We'll add MiraLax.  Coagulopathy/anticoagulation secondary to chronic atrial fibrillation. Xarelto and  aspirin have both been discontinued in the setting of active bleeding. She is status post vitamin K and 4 units of fresh frozen plasma. Her INR is now within normal limits at 1.22.  Anemia associated hemorrhage/acute blood loss. The patient's hemoglobin was 12.2 on admission and has just it down to a nadir of 5.0. She is status post 4 units of packed red blood cells. Her hemoglobin is currently 9.7. A fifth unit of packed red blood cell transfusion was ordered, but will cancel it for now. Continue to monitor her hemoglobin/hematocrit every 6 hours.  Thrombocytopenia. Likely secondary to consumption. No indication for transfusion as her platelet count is 90,000.  Hypotension secondary to acute blood loss. With IV fluid hydration and transfusion of blood products, her blood pressure is better; still low normal.. Her cortisol level is within normal limits.  Brief syncope secondary to hypotension. No focal neurological abnormalities noted.  Chronic atrial fibrillation.  She is currently on digoxin. Every 6 hour dosing of Cardizem was started due to soft blood pressures. (Once daily Cardizem is on hold).  Chronic diastolic  congestive heart failure. She is a little bit more dyspneic this morning, but this may be secondary to her COPD. Her ejection fraction is noted to be 60%.  Her proBNP was within normal limits. Her troponin I was negative. We'll continue to hold Lasix for now. We'll order another chest x-ray to rule out pulmonary edema.  COPD. She has a few upper airway expiratory wheezes and mild dyspnea. She has been off of her inhaler for several days. Will restart topical dilator therapy with Xopenex.  Plan:  1. Hold fifth unit of packed red blood cells. We'll continue to monitor her CBC every 6 hours. 2. Bronchodilator therapy with Xopenex. 3. Recheck another chest x-ray to rule out edema. In the meantime, continue to hold Lasix and continue to gently hydrate.       Total critical care time: 40 minutes.   LOS: 3 days   Nikeia Henkes 04/24/2012, 10:41 AM

## 2012-04-24 NOTE — Clinical Social Work Placement (Signed)
Clinical Social Work Department CLINICAL SOCIAL WORK PLACEMENT NOTE 04/24/2012  Patient:  Deborah [name], Deborah [name]  Account Number:  [id number] Admit date:  04/21/2012  Clinical Social Worker:  Derenda Fennel, LCSW  Date/time:  04/23/2012 11:52 AM  Clinical Social Work is seeking post-discharge placement for this patient at the following level of care:   SKILLED NURSING   (*CSW will update this form in Epic as items are completed)   04/23/2012  Patient/family provided with [facility] Department of Clinical Social Work's list of facilities offering this level of care within the geographic area requested by the patient (or if unable, by the patient's family).  04/23/2012  Patient/family informed of their freedom to choose among providers that offer the needed level of care, that participate in Medicare, Medicaid or managed care program needed by the patient, have an available bed and are willing to accept the patient.  04/23/2012  Patient/family informed of [facility]' ownership interest in [facility], as well as of the fact that they are under no obligation to receive care at this facility.  PASARR submitted to EDS on 04/23/2012 PASARR number received from [employer] on 04/23/2012  FL2 transmitted to all facilities in geographic area requested by pt/family on  04/23/2012 FL2 transmitted to all facilities within larger geographic area on   Patient informed that his/her managed care company has contracts with or will negotiate with  certain facilities, including the following:     Patient/family informed of bed offers received:  04/24/2012 Patient chooses bed at [facility] Physician recommends and patient chooses bed at  [facility]  Patient to be transferred to  on   Patient to be transferred to facility by   The following physician request were entered in Epic:   Additional Comments:  Derenda Fennel, LCSW [phone]

## 2012-04-25 LAB — CBC
HCT: 26.1 % — ABNORMAL LOW (ref 36.0–46.0)
HCT: 26.5 % — ABNORMAL LOW (ref 36.0–46.0)
MCH: 29.6 pg (ref 26.0–34.0)
MCHC: 34.1 g/dL (ref 30.0–36.0)
MCV: 87.9 fL (ref 78.0–100.0)
MCV: 88 fL (ref 78.0–100.0)
Platelets: 134 10*3/uL — ABNORMAL LOW (ref 150–400)
RDW: 15 % (ref 11.5–15.5)
RDW: 14.8 % (ref 11.5–15.5)

## 2012-04-25 MED ORDER — FUROSEMIDE 20 MG PO TABS
10.0000 mg | ORAL_TABLET | Freq: Two times a day (BID) | ORAL | Status: DC
  Administered 2012-04-25 (×2): 10 mg via ORAL
  Filled 2012-04-25 (×3): qty 1

## 2012-04-25 NOTE — Progress Notes (Signed)
Occupational Therapy Treatment Patient Details Name: Deborah [name] MRN: [id number] DOB: Nov 09, 1925 Today's Date: 04/25/2012 Time: 7829-5621 OT Time Calculation (min): 25 min  OT Assessment / Plan / Recommendation Comments on Treatment Session Patient seated in recliner upon therapy arrival. Agreeable to participate in UE exercises before returning to bed. Patient complained of being very fatigued this afternoon and was having difficulty speaking at times. Nurse, Marcelino Duster present during transfer and  assist therapist during transfer and bed mobility.                    Plan Discharge plan remains appropriate;Frequency remains appropriate    Precautions / Restrictions Precautions Precautions: Fall Restrictions Weight Bearing Restrictions: Yes LLE Weight Bearing: Weight bearing as tolerated   Pertinent Vitals/Pain No complaints of pain.    ADL  Lower Body Bathing: Simulated;+2 Total assistance Lower Body Bathing: Patient Percentage: 30% Where Assessed - Lower Body Bathing: Supported sit to stand Lower Body Dressing: Simulated;+2 Total assistance Lower Body Dressing: Patient Percentage: 0% Where Assessed - Lower Body Dressing: Supported sit to stand Toilet Transfer: Simulated;Maximal assistance Toilet Transfer Method: Surveyor, minerals:  (recliner to bed) Transfers/Ambulation Related to ADLs: patient transfers at a [employer] level. No device used. vc's for technique. Slow moving.      OT Goals ADL Goals ADL Goal: Lower Body Bathing - Progress: Progressing toward goals ADL Goal: Lower Body Dressing - Progress: Progressing toward goals ADL Goal: Toilet Transfer - Progress: Progressing toward goals Arm Goals Arm Goal: Theraband Exercises - Progress: Progressing toward goal  Visit Information  Last OT Received On: 04/25/12 Assistance Needed: +1    Subjective Data  Subjective: "I haven't had to depend on anyone for help in my life." Patient Stated  Goal: To go home.      Cognition  Overall Cognitive Status: Appears within functional limits for tasks assessed/performed Arousal/Alertness: Awake/alert Orientation Level: Appears intact for tasks assessed Behavior During Session: Anxious (during transfer)    Mobility  Shoulder Instructions Bed Mobility Bed Mobility: Sit to Supine Sit to Supine: 1: +2 Total assist;HOB flat;With rail Sit to Supine: Patient Percentage: 20% Details for Bed Mobility Assistance: pt is very fearful of moving in the bed and unable to assist in any way, but cooperative with allowing therapist to help her maximally Transfers Transfers: Sit to Stand;Stand to Sit Sit to Stand: 2: Max assist;From chair/3-in-1;With armrests;With upper extremity assist Stand to Sit: 2: Max assist;With upper extremity assist;To bed       Exercises  General Exercises - Upper Extremity Shoulder Horizontal ADduction: Strengthening;Both;10 reps;Theraband;Seated Theraband Level (Shoulder Horizontal Adduction): Level 2 (Red) Elbow Flexion: Strengthening;Both;10 reps;Theraband;Seated Theraband Level (Elbow Flexion): Level 2 (Red) Elbow Extension: Strengthening;Both;10 reps;Theraband;Seated Theraband Level (Elbow Extension): Level 2 (Red)       End of Session OT - End of Session Equipment Utilized During Treatment: Gait belt Activity Tolerance: Patient tolerated treatment well;Patient limited by pain;Patient limited by fatigue Patient left: in bed;with call bell/phone within reach;with nursing in room Nurse Communication: Mobility status (nurse present during transfer.)    [name], OTR/L 04/25/2012, 1:54 PM

## 2012-04-25 NOTE — Progress Notes (Signed)
Pt returned to bed with help of PT. During transfer back suppository given. Pt placed on bedpan about 15 minutes later. Pt passing gas but no bowel movement. Pt resting, VS stable. Will continue to monitor. Family at bedside.

## 2012-04-25 NOTE — Progress Notes (Signed)
Physical Therapy Treatment Patient Details Name: Deborah [name] MRN: [id number] DOB: 1925/11/02 Today's Date: 04/25/2012 Time: 4098-1191 PT Time Calculation (min): 49 min  PT Assessment / Plan / Recommendation Comments on Treatment Session  Pt has no pain with any ther ex.  She is very drowsy from pain med, but is very cooperative and able to tolerate bed to chair transfer, total assist of therapist needed.  We will return after lunch to work with nursing staff in returning pt back to the bed.    Follow Up Recommendations        Does the patient have the potential to tolerate intense rehabilitation     Barriers to Discharge        Equipment Recommendations       Recommendations for Other Services    Frequency     Plan Discharge plan remains appropriate    Precautions / Restrictions Restrictions Weight Bearing Restrictions: Yes LLE Weight Bearing: Weight bearing as tolerated   Pertinent Vitals/Pain     Mobility  Bed Mobility Supine to Sit: 1: +1 Total assist;HOB elevated Details for Bed Mobility Assistance: pt is very fearful of moving in the bed and unable to assist in any way, but cooperative with allowing therapist to help her maximally Transfers Stand Pivot Transfers: 1: +1 Total assist Ambulation/Gait Ambulation/Gait Assistance: Not tested (comment)    Exercises General Exercises - Lower Extremity Ankle Circles/Pumps: AROM;Both;10 reps;Supine Quad Sets: AROM;Both;10 reps;Supine Gluteal Sets: AROM;Both;10 reps;Supine Short Arc Quad: AAROM;Both;10 reps;Supine Heel Slides: AAROM;Both;10 reps;Supine Hip ABduction/ADduction: AAROM;Both;10 reps;Supine   PT Diagnosis:    PT Problem List:   PT Treatment Interventions:     PT Goals Acute Rehab PT Goals PT Goal: Supine/Side to Sit - Progress: Progressing toward goal PT Goal: Sit to Stand - Progress: Progressing toward goal PT Goal: Stand to Sit - Progress: Progressing toward goal PT Transfer Goal: Bed to Chair/Chair  to Bed - Progress: Progressing toward goal PT Goal: Ambulate - Progress: Not progressing  Visit Information  Last PT Received On: 04/25/12    Subjective Data  Subjective: I have lost faith in myself   Cognition       Balance     End of Session PT - End of Session Equipment Utilized During Treatment: Gait belt Activity Tolerance: Patient tolerated treatment well Patient left: in chair;with call bell/phone within reach Nurse Communication: Mobility status   GP     Myrlene Broker L 04/25/2012, 11:52 AM

## 2012-04-25 NOTE — Progress Notes (Signed)
Subjective: The patient is lying in bed. She says that her abdomen is still sore. She has not had a bowel movement in several days. Overall, she says that she feels better.  Objective: Vital signs in last 24 hours: Filed Vitals:   04/25/12 0632 04/25/12 0700 04/25/12 0717 04/25/12 0800  BP: 103/70 84/46    Pulse:  95  97  Temp:    97.9 F (36.6 C)  TempSrc:    Oral  Resp:  19  23  Height:      Weight:      SpO2:  97% 98% 97%    Intake/Output Summary (Last 24 hours) at 04/25/12 1022 Last data filed at 04/25/12 0800  Gross per 24 hour  Intake   2315 ml  Output   1700 ml  Net    615 ml    Weight change:   Physical exam: General: Pleasant alert 78 year old Caucasian woman laying in bed, in no acute distress. Lungs: Decreased breath sounds in the bases. Breathing is nonlabored. Heart: Irregular, irregular, with borderline tachycardia. Abdomen/pelvis: Less firm, positive bowel sounds, moderately tender diffusely, but mostly on the right lower quadrant. Extremities: No pedal edema. Neurologic: She is alert and oriented x2. Cranial nerves II through XII are grossly intact.  Lab Results: Basic Metabolic Panel:  Basename 04/24/12 0513  NA 136  K 4.6  CL 103  CO2 26  GLUCOSE 111*  BUN 15  CREATININE 0.55  CALCIUM 8.4  MG --  PHOS --   Liver Function Tests:  Basename 04/24/12 0513  AST 15  ALT 9  ALKPHOS 53  BILITOT 0.6  PROT 5.4*  ALBUMIN 2.9*   No results found for this basename: LIPASE:2,AMYLASE:2 in the last 72 hours No results found for this basename: AMMONIA:2 in the last 72 hours CBC:  Basename 04/25/12 0426 04/24/12 2226 04/23/12 1034  WBC 6.6 7.8 --  NEUTROABS -- -- 8.7*  HGB 8.9* 9.0* --  HCT 26.1* 26.0* --  MCV 87.9 86.4 --  PLT 96* 97* --   Cardiac Enzymes:  Basename 04/23/12 0326  CKTOTAL --  CKMB --  CKMBINDEX --  TROPONINI <0.30   BNP:  Basename 04/25/12 0426 04/23/12 0326  PROBNP 1721.0* 190.9   D-Dimer: No results found for  this basename: DDIMER:2 in the last 72 hours CBG: No results found for this basename: GLUCAP:6 in the last 72 hours Hemoglobin A1C: No results found for this basename: HGBA1C in the last 72 hours Fasting Lipid Panel: No results found for this basename: CHOL,HDL,LDLCALC,TRIG,CHOLHDL,LDLDIRECT in the last 72 hours Thyroid Function Tests:  Basename 04/22/12 1320  TSH 2.031  T4TOTAL --  FREET4 --  T3FREE --  THYROIDAB --   Anemia Panel: No results found for this basename: VITAMINB12,FOLATE,FERRITIN,TIBC,IRON,RETICCTPCT in the last 72 hours Coagulation:  Basename 04/24/12 0513 04/22/12 2131  LABPROT 15.2 28.7*  INR 1.22 2.88*   Urine Drug Screen: Drugs of Abuse  No results found for this basename: labopia,  cocainscrnur,  labbenz,  amphetmu,  thcu,  labbarb    Alcohol Level: No results found for this basename: ETH:2 in the last 72 hours Urinalysis:  Basename 04/22/12 1625  COLORURINE YELLOW  LABSPEC >1.030*  PHURINE 6.0  GLUCOSEU NEGATIVE  HGBUR NEGATIVE  BILIRUBINUR NEGATIVE  KETONESUR TRACE*  PROTEINUR NEGATIVE  UROBILINOGEN 0.2  NITRITE NEGATIVE  LEUKOCYTESUR NEGATIVE   Misc. Labs:   Micro: Recent Results (from the past 240 hour(s))  MRSA PCR SCREENING     Status: Normal  Collection Time   04/22/12  9:53 PM      Component Value Range Status Comment   MRSA by PCR NEGATIVE  NEGATIVE Final     Studies/Results: Dg Chest Port 1 View  04/24/2012  *RADIOLOGY REPORT*  Clinical Data: Shortness of breath, post blood transfusion  PORTABLE CHEST - 1 VIEW  Comparison: Portable exam 1017 hours compared to 04/23/2012  Findings: Right arm PICC line stable tip projecting over cavoatrial junction. Enlargement of cardiac silhouette with pulmonary vascular congestion. Calcified tortuous aorta. Chronic peribronchial thickening. Questionable right basilar infiltrate. Remaining lungs clear. No pleural effusion or pneumothorax. Coronary arterial stent noted. Bones demineralized.   IMPRESSION: Enlargement of cardiac silhouette with pulmonary vascular congestion. Chronic bronchitic changes with questionable right basilar infiltrate.   Original Report Authenticated By: Ulyses Southward, M.D.      2-D echo: Study Conclusions:   - Left ventricle: The cavity size was normal. There was mild concentric hypertrophy. Systolic function was normal. The estimated ejection fraction was in the range of 60% to 65%. Wall motion was normal; there were no regional wall motion abnormalities. - Aortic valve: Mildly calcified annulus. Mildly calcified leaflets. - Mitral valve: Calcified annulus. - Left atrium: The atrium was moderately dilated. - Right atrium: The atrium was mildly to moderately dilated. - Atrial septum: No defect or patent foramen ovale was identified. Transthoracic echocardiography. M-mode, complete 2D, spectral Doppler, and color Doppler. Height: Height: 162.6cm. Height: 64in. Weight: Weight: 63.5kg. Weight: 139.7lb. Body mass index: BMI: 24kg/m^2. Body surface area: BSA: 1.27m^2. Patient status: Inpatient. Location: ICU/CCU    Medications:  Scheduled:    . acetaminophen  650 mg Oral Once  . digoxin  0.125 mg Oral Daily  . diltiazem  30 mg Oral Q6H  . levalbuterol  0.63 mg Nebulization TID  . levETIRAcetam  125 mg Oral QPM  . levETIRAcetam  250 mg Oral Daily  . pantoprazole  40 mg Oral Q M,W,F  . polyethylene glycol  17 g Oral Daily  . polyvinyl alcohol  1 drop Both Eyes QID  . potassium chloride  20 mEq Oral Daily  . senna  1 tablet Oral BID  . simvastatin  20 mg Oral q1800  . sodium chloride  10-40 mL Intracatheter Q12H   Continuous:    . 0.9 % NaCl with KCl 20 mEq / L 75 mL/hr at 04/25/12 0800   ZOX:[id number], albuterol, bisacodyl, bisacodyl, morphine injection, ondansetron (ZOFRAN) IV, ondansetron, oxyCODONE, sodium chloride, traZODone, [DISCONTINUED] polyethylene glycol  Assessment: Principal Problem:  *Fracture of Left superior and  inferior pubic rami Active Problems:  Traumatic retroperitoneal hematoma  Acute post-hemorrhagic anemia  Permanent atrial fibrillation  DIASTOLIC HEART FAILURE, CHRONIC  Chronic anticoagulation  Coronary artery disease  COPD (chronic obstructive pulmonary disease)  Fall  Macular degeneration  RLQ abdominal pain  Hypotension  Syncope  Thrombocytopenia    1. Newly diagnosed large pelvic hematoma as well as retroperitoneal hemorrhage and hemorrhage into the anterior abdominal wall in the setting of pelvic fracture as an anti-coagulation. Orthopedic surgeon Dr. Romeo Apple was consulted. His recommendations noted for bed rest yesterday, but with prefer progressing patient a little bit more today.. General surgeon, Dr. Lovell Sheehan was consulted. His recommendations noted and appreciated. No surgical indication currently. (The patient was discussed with my colleague Dr. Orvan Falconer who discussed the finding with the trauma surgeon on call at [facility]. Apparently, there was no surgical indication at the time). Continue supportive treatment.  Pelvic fractures/acute and mildly displaced fractures of both superior and inferior pubic  rami on the left; status post fall at home.. Dr. Romeo Apple has seen and evaluated the patient. We'll progress the patient to physical therapy as tolerated, given stability of hemoglobin and hematocrit and slight clinical improvement.  Abdominal pain secondary to above. Also, the patient appears to be constipated. She is on Senokot twice daily. MiraLax added. When necessary Dulcolax suppository added.  Coagulopathy/anticoagulation secondary to chronic atrial fibrillation. Xarelto and aspirin have both been discontinued in the setting of active bleeding. She is status post vitamin K and 4 units of fresh frozen plasma. Her INR is now within normal limits at 1.22.  Anemia associated hemorrhage/acute blood loss. The patient's hemoglobin was 12.2 on admission and has just it down to a nadir  of 5.0. She is status post 4 units of packed red blood cells. Her hemoglobin was 9.7 after 4 units of blood. It is now 8.9, but stable over the past 2 hemoglobin readings.  Thrombocytopenia. Likely secondary to consumption. No indication for transfusion as her platelet count is 90,000.  Hypotension secondary to acute blood loss. Also, the patient reports chronically low normal blood pressures in the upper 80s to low 90s systolically. With IV fluid hydration and transfusion of blood products, her blood pressure is better; still low normal.. Her cortisol level is within normal limits.  Brief syncope secondary to hypotension. No focal neurological abnormalities noted.  Chronic atrial fibrillation.  She is currently on digoxin. Every 6 hour dosing of Cardizem was started due to soft blood pressures. (Once daily Cardizem is on hold). We'll hold diltiazem for systolic blood pressures less than 90.  Chronic diastolic congestive heart failure. Her chest x-ray on 04/24/2012 revealed a little bit more vascular congestion. Her proBNP is elevated now, but it was within normal limits. We'll continue to monitor her for when necessary Lasix if needed and if her blood pressure can withstand it because her home dose of Lasix is currently on hold.  Her ejection fraction is 60%. Her troponin I was neg  COPD.  Bronchodilator therapy was restarted with Xopenex. She is on albuterol inhaler chronically.   Plan:  1. We'll decrease IV fluids cautiously. Will restart Lasix at a lower dose cautiously. 2. Resume physical therapy as tolerated. 3. Continue to monitor the patient's hemoglobin and hematocrit every 6-8 hours.      LOS: 4 days   Narvel Kozub 04/25/2012, 10:22 AM

## 2012-04-26 DIAGNOSIS — R578 Other shock: Secondary | ICD-10-CM | POA: Diagnosis not present

## 2012-04-26 LAB — TYPE AND SCREEN
ABO/RH(D): A POS
Unit division: 0
Unit division: 0
Unit division: 0
Unit division: 0

## 2012-04-26 LAB — BASIC METABOLIC PANEL
BUN: 9 mg/dL (ref 6–23)
Calcium: 7.5 mg/dL — ABNORMAL LOW (ref 8.4–10.5)
GFR calc Af Amer: >90 mL/min (ref >90)
GFR calc non Af Amer: 88 mL/min — ABNORMAL LOW (ref >90)
Potassium: 4.9 mEq/L (ref 3.5–5.1)

## 2012-04-26 LAB — CBC
HCT: 24 % — ABNORMAL LOW (ref 36.0–46.0)
Hemoglobin: 9.4 g/dL — ABNORMAL LOW (ref 12.0–15.0)
MCH: 29.7 pg (ref 26.0–34.0)
MCH: 29.5 pg (ref 26.0–34.0)
MCHC: 33.8 g/dL (ref 30.0–36.0)
MCHC: 33.1 g/dL (ref 30.0–36.0)
MCV: 88.7 fL (ref 78.0–100.0)
Platelets: 155 10*3/uL (ref 150–400)
Platelets: 167 10*3/uL (ref 150–400)
RBC: 3.1 MIL/uL — ABNORMAL LOW (ref 3.87–5.11)
RDW: 14.7 % (ref 11.5–15.5)
RDW: 14.7 % (ref 11.5–15.5)
RDW: 14.7 % (ref 11.5–15.5)
WBC: 8.5 10*3/uL (ref 4.0–10.5)

## 2012-04-26 MED ORDER — FUROSEMIDE 20 MG PO TABS
20.0000 mg | ORAL_TABLET | Freq: Two times a day (BID) | ORAL | Status: DC
  Administered 2012-04-26: 20 mg via ORAL

## 2012-04-26 MED ORDER — FUROSEMIDE 20 MG PO TABS: 20.0000 mg | ORAL_TABLET | Freq: Every day | ORAL | Status: DC

## 2012-04-26 NOTE — Progress Notes (Signed)
Daily wt not assessed as bed scale isn't accurate, says she was 3kg

## 2012-04-26 NOTE — Clinical Documentation Improvement (Signed)
SHOCK DOCUMENTATION CLARIFICATION QUERY  THIS DOCUMENT IS NOT A PERMANENT PART OF THE MEDICAL RECORD  TO RESPOND TO THE THIS QUERY, FOLLOW THE INSTRUCTIONS BELOW:  1. If needed, update documentation for the patient's encounter via the notes activity.  2. Access this query again and click edit on the In [employer].  3. After updating, or not, click F2 to complete all highlighted (required) fields concerning your review. Select "additional documentation in the medical record" OR "no additional documentation provided".  4. Click Sign note button.  5. The deficiency will fall out of your In Basket *Please let us know if you are not able to complete this workflow by phone or e-mail (listed below).  Please update your documentation within the medical record to reflect your response to this query.                                                                                        04/26/12   Dear Dr. [name]  In a better effort to capture your patient's severity of illness, reflect appropriate length of stay and utilization of resources, a review of the patient medical record has revealed the following indicators.    Based on your clinical judgment, please clarify and document in a progress note and/or discharge summary the clinical condition associated with the following supporting information:  In responding to this query please exercise your independent judgment.  The fact that a query is asked, does not imply that any particular answer is desired or expected.  May also state that the condition is resolving, resolved,etc   Possible Clinical Conditions  Hypovolemic Shock  Hemorrhagic Shock  Other Condition  Cannot Clinically determine   Contributing factors: Traumatic retroperitoneal hematoma and hemorrhage into the anterior abdominal wall in the setting of pelvic fracture as an anti-coagulation  Pelvic fractures/acute and mildly displaced fractures of  both superior and inferior pubic rami on the left; status post fall at home  Chronic anticoagulation (AFib)  Signs/symptoms: Hypotension Syncope Thrombocytopenia Low hemoglobin/hemocrit Tachycardia  VS: B/P 81/58 HR 105  12-4 @1300  B/P range Mid 80's to low 90's Systolic (04-25-12)   Diagnostics: Results for Deborah, [name] (MRN [id number]) as of 04/26/2012 10:39  Ref. Range 04/21/2012 19:20 04/22/2012 01:49 04/22/2012 21:31 04/23/2012 03:26 04/23/2012 10:34 04/23/2012 17:22 04/23/2012 21:25 04/24/2012 05:13 04/24/2012 16:45 04/24/2012 22:26 04/25/2012 18:53 04/26/2012 02:27  Hemoglobin Latest Range: 12.0-15.0 g/dL 40.9 81.1 8.5 (L) 5.0 (LL) 7.8 (L) 9.4 (L) 8.8 (L) 8.0 (L) 9.5 (L) 9.0 (L) 8.9 (L) 8.1 (L)   Results for Deborah, [name] (MRN [id number]) as of 04/26/2012 10:39  Ref. Range 04/23/2012 03:26 04/23/2012 10:34 04/23/2012 17:22 04/23/2012 21:25 04/24/2012 05:13 04/24/2012 10:11 04/24/2012 16:45 04/24/2012 22:26 04/25/2012 04:26 04/25/2012 18:53 04/26/2012 02:27  HCT Latest Range: 36.0-46.0 % 15.1 (L) 23.1 (L) 27.3 (L) 25.3 (L) 23.6 (L) 28.0 (L) 27.7 (L) 26.0 (L) 26.1 (L) 26.5 (L) 24.0 (L)   Results for Deborah, [name] (MRN [id number]) as of 04/26/2012 10:39  Ref. Range 04/22/2012 21:31 04/23/2012 03:26 04/23/2012 10:34 04/23/2012 21:25 04/24/2012 05:13 04/24/2012 10:11 04/24/2012 16:45 04/24/2012 22:26 04/25/2012 04:26 04/26/2012 02:27  Platelets Latest Range: 150-400 K/uL 171 91 (L) 80 (L) 91 (L) 90 (L) 89 (L) 98 (L) 97 (L) 96 (L) 125 (L)    Treatment: Total of 4 units of FFP Total of 4 units PRBC 500 cc NS Bolus x 1 Continuous cardiac monitoring in ICU   You may use possible, probable, or suspect with inpatient documentation. possible, probable, suspected diagnoses MUST be documented at the time of discharge   Reviewed: Query was answered  Thank You,   Rossie Muskrat RN, BSN Clinical Documentation Specialist Telephone # [phone]/Pager: [phone] [email or web address]  Health Information  Management [place]

## 2012-04-26 NOTE — Progress Notes (Signed)
Physical Therapy Treatment Patient Details Name: Deborah [name] MRN: [id number] DOB: [birth year] Today's Date: 04/26/2012 Time: 0922-1025 PT Time Calculation (min): 63 min  PT Assessment / Plan / Recommendation Comments on Treatment Session  Pt is making slow progress.  She was able to come to stance at EOB using a walker and maintain stance independently. There was no pain with ther ex to LEs except for pain in lower peroneal  L with palpation.          Follow Up Recommendations        Does the patient have the potential to tolerate intense rehabilitation     Barriers to Discharge        Equipment Recommendations       Recommendations for Other Services    Frequency     Plan Discharge plan remains appropriate;Frequency remains appropriate    Precautions / Restrictions Restrictions Weight Bearing Restrictions: No LLE Weight Bearing: Weight bearing as tolerated   Pertinent Vitals/Pain     Mobility  Bed Mobility Supine to Sit: 1: +1 Total assist;HOB elevated Sit to Supine: Not Tested (comment) Transfers Sit to Stand: 3: Mod assist;From elevated surface;From bed Stand to Sit: 3: Mod assist;To bed;With upper extremity assist Stand Pivot Transfers: 2: Max assist Details for Transfer Assistance: Pt able to stand with walker and bear minimal weight on LLE with no pain.  She was unable to off load either foot in order to take a step and states that she has excruciating pain in pelvis with FWB Ambulation/Gait Ambulation/Gait Assistance: Not tested (comment)    Exercises General Exercises - Lower Extremity Ankle Circles/Pumps: AROM;Both;10 reps;Supine Quad Sets: AROM;Both;10 reps;Supine Gluteal Sets: AROM;Both;10 reps;Supine Short Arc Quad: AAROM;Both;10 reps;Supine Heel Slides: AAROM;Both;10 reps;Supine Hip ABduction/ADduction: AAROM;Both;10 reps;Supine   PT Diagnosis:    PT Problem List:   PT Treatment Interventions:     PT Goals Acute Rehab PT Goals PT Goal:  Supine/Side to Sit - Progress: Progressing toward goal PT Goal: Sit to Stand - Progress: Progressing toward goal PT Goal: Stand to Sit - Progress: Progressing toward goal PT Transfer Goal: Bed to Chair/Chair to Bed - Progress: Progressing toward goal PT Goal: Ambulate - Progress: Progressing toward goal  Visit Information  Last PT Received On: 04/26/12    Subjective Data  Subjective: I don't remember yesterday   Cognition       Balance     End of Session PT - End of Session Equipment Utilized During Treatment: Gait belt Activity Tolerance: Patient tolerated treatment well Patient left: in chair;with call bell/phone within reach Nurse Communication: Mobility status   GP     Konrad Penta 04/26/2012, 10:35 AM

## 2012-04-26 NOTE — Progress Notes (Signed)
Occupational Therapy Treatment Patient Details Name: Deborah [name] MRN: [id number] DOB: 1926-02-29 Today's Date: 04/26/2012 Time: 1478-2956 OT Time Calculation (min): 18 min  OT Assessment / Plan / Recommendation Comments on Treatment Session Patient sleeping upon arrival. Patient was agreeable to participate in UE exercises. Patient was not in high spirits today compared to yesterday.                    Plan Discharge plan remains appropriate;Frequency remains appropriate    Precautions / Restrictions Precautions Precautions: Fall Restrictions Weight Bearing Restrictions: Yes LLE Weight Bearing: Weight bearing as tolerated   Pertinent Vitals/Pain No complaints.    ADL  Grooming: Performed;Brushing hair;Modified independent Where Assessed - Grooming: Supine, head of bed up      OT Goals ADL Goals ADL Goal: Grooming - Progress: Progressing toward goals Arm Goals Arm Goal: Theraband Exercises - Progress: Progressing toward goal  Visit Information  Last OT Received On: 04/26/12 Assistance Needed: +1    Subjective Data  Subjective: "I had a good lunch." Patient Stated Goal: To go home.      Cognition  Overall Cognitive Status: Appears within functional limits for tasks assessed/performed Arousal/Alertness: Awake/alert Orientation Level: Appears intact for tasks assessed Behavior During Session: Flat affect       Exercises  General Exercises - Upper Extremity Shoulder Extension: Strengthening;Both;10 reps;Theraband;Supine Theraband Level (Shoulder Extension): Level 2 (Red) Shoulder Horizontal ABduction: Strengthening;Both;10 reps;Theraband;Supine Theraband Level (Shoulder Horizontal Abduction): Level 2 (Red) Shoulder Horizontal ADduction: Strengthening;Both;10 reps;Theraband;Supine Theraband Level (Shoulder Horizontal Adduction): Level 2 (Red) Elbow Flexion: Strengthening;Both;10 reps;Theraband;Seated Theraband Level (Elbow Flexion): Level 2 (Red) Elbow  Extension: Strengthening;Both;10 reps;Theraband;Supine Theraband Level (Elbow Extension): Level 2 (Red)      End of Session OT - End of Session Activity Tolerance: Patient tolerated treatment well Patient left: in bed;with call bell/phone within reach;with bed alarm set    [name], OTR/L 04/26/2012, 2:52 PM

## 2012-04-26 NOTE — Progress Notes (Signed)
Subjective: The patient is lying in bed. Her abdomen is less sore. Following laxative therapy and suppositories, she has had several bowel movements.  Objective: Vital signs in last 24 hours: Filed Vitals:   04/26/12 0800 04/26/12 0900 04/26/12 1000 04/26/12 1100  BP: 89/59 95/67 103/64 83/55  Pulse: 95 84 108 27  Temp: 98.2 F (36.8 C)     TempSrc: Oral     Resp: 20 16 21    Height:      Weight:      SpO2: 97% 97% 97% 94%    Intake/Output Summary (Last 24 hours) at 04/26/12 1146 Last data filed at 04/26/12 1100  Gross per 24 hour  Intake   2266 ml  Output   3150 ml  Net   -884 ml    Weight change:   Physical exam: General: Pleasant alert 78 year old Caucasian woman laying in bed, in no acute distress. Lungs: Decreased breath sounds in the bases. Breathing is nonlabored. Heart: Irregular, irregular, with borderline tachycardia. Abdomen/pelvis: Less firm, positive bowel sounds, less tender overall and softer. Extremities: No pedal edema. Neurologic: She is alert and oriented x2. Cranial nerves II through XII are grossly intact.  Lab Results: Basic Metabolic Panel:  Basename 04/26/12 0450 04/24/12 0513  NA 136 136  K 4.9 4.6  CL 105 103  CO2 25 26  GLUCOSE 95 111*  BUN 9 15  CREATININE 0.49* 0.55  CALCIUM 7.5* 8.4  MG -- --  PHOS -- --   Liver Function Tests:  [facility] 04/24/12 0513  AST 15  ALT 9  ALKPHOS 53  BILITOT 0.6  PROT 5.4*  ALBUMIN 2.9*   No results found for this basename: LIPASE:2,AMYLASE:2 in the last 72 hours No results found for this basename: AMMONIA:2 in the last 72 hours CBC:  Basename 04/26/12 1024 04/26/12 0227  WBC 8.8 7.1  NEUTROABS -- --  HGB 9.4* 8.1*  HCT 28.4* 24.0*  MCV 89.0 87.9  PLT 155 125*   Cardiac Enzymes: No results found for this basename: CKTOTAL:3,CKMB:3,CKMBINDEX:3,TROPONINI:3 in the last 72 hours BNP:  Basename 04/25/12 0426  PROBNP 1721.0*   D-Dimer: No results found for this basename: DDIMER:2 in  the last 72 hours CBG: No results found for this basename: GLUCAP:6 in the last 72 hours Hemoglobin A1C: No results found for this basename: HGBA1C in the last 72 hours Fasting Lipid Panel: No results found for this basename: CHOL,HDL,LDLCALC,TRIG,CHOLHDL,LDLDIRECT in the last 72 hours Thyroid Function Tests: No results found for this basename: TSH,T4TOTAL,FREET4,T3FREE,THYROIDAB in the last 72 hours Anemia Panel: No results found for this basename: VITAMINB12,FOLATE,FERRITIN,TIBC,IRON,RETICCTPCT in the last 72 hours Coagulation:  Basename 04/24/12 0513  LABPROT 15.2  INR 1.22   Urine Drug Screen: Drugs of Abuse  No results found for this basename: labopia,  cocainscrnur,  labbenz,  amphetmu,  thcu,  labbarb    Alcohol Level: No results found for this basename: ETH:2 in the last 72 hours Urinalysis: No results found for this basename: COLORURINE:2,APPERANCEUR:2,LABSPEC:2,PHURINE:2,GLUCOSEU:2,HGBUR:2,BILIRUBINUR:2,KETONESUR:2,PROTEINUR:2,UROBILINOGEN:2,NITRITE:2,LEUKOCYTESUR:2 in the last 72 hours Misc. Labs:   Micro: Recent Results (from the past 240 hour(s))  MRSA PCR SCREENING     Status: Normal   Collection Time   04/22/12  9:53 PM      Component Value Range Status Comment   MRSA by PCR NEGATIVE  NEGATIVE Final     Studies/Results: No results found.   2-D echo: Study Conclusions:   - Left ventricle: The cavity size was normal. There was mild concentric hypertrophy. Systolic function was normal.  The estimated ejection fraction was in the range of 60% to 65%. Wall motion was normal; there were no regional wall motion abnormalities. - Aortic valve: Mildly calcified annulus. Mildly calcified leaflets. - Mitral valve: Calcified annulus. - Left atrium: The atrium was moderately dilated. - Right atrium: The atrium was mildly to moderately dilated. - Atrial septum: No defect or patent foramen ovale was identified. Transthoracic echocardiography. M-mode, complete  2D, spectral Doppler, and color Doppler. Height: Height: 162.6cm. Height: 64in. Weight: Weight: 63.5kg. Weight: 139.7lb. Body mass index: BMI: 24kg/m^2. Body surface area: BSA: 1.78m^2. Patient status: Inpatient. Location: ICU/CCU    Medications:  Scheduled:    . acetaminophen  650 mg Oral Once  . digoxin  0.125 mg Oral Daily  . diltiazem  30 mg Oral Q6H  . furosemide  20 mg Oral BID  . levalbuterol  0.63 mg Nebulization TID  . levETIRAcetam  125 mg Oral QPM  . levETIRAcetam  250 mg Oral Daily  . pantoprazole  40 mg Oral Q M,W,F  . polyethylene glycol  17 g Oral Daily  . polyvinyl alcohol  1 drop Both Eyes QID  . senna  1 tablet Oral BID  . simvastatin  20 mg Oral q1800  . sodium chloride  10-40 mL Intracatheter Q12H  . [DISCONTINUED] furosemide  10 mg Oral BID   Continuous:    . 0.9 % NaCl with KCl 20 mEq / L 40 mL/hr at 04/26/12 1136   ZOX:[id number], albuterol, bisacodyl, bisacodyl, morphine injection, ondansetron (ZOFRAN) IV, ondansetron, oxyCODONE, sodium chloride, traZODone  Assessment: Principal Problem:  *Fracture of Left superior and inferior pubic rami Active Problems:  Traumatic retroperitoneal hematoma  Acute post-hemorrhagic anemia  Permanent atrial fibrillation  DIASTOLIC HEART FAILURE, CHRONIC  Chronic anticoagulation  Coronary artery disease  COPD (chronic obstructive pulmonary disease)  Fall  Macular degeneration  RLQ abdominal pain  Hypotension  Syncope  Thrombocytopenia    1. Newly diagnosed large pelvic hematoma as well as retroperitoneal hemorrhage and hemorrhage into the anterior abdominal wall in the setting of pelvic fracture as an anti-coagulation. Orthopedic surgeon Dr. Romeo Apple was consulted. His recommendations noted. General surgeon, Dr. Lovell Sheehan was consulted. His recommendations noted and appreciated. No surgical indication currently. (The patient was discussed with my colleague Dr. Orvan Falconer who discussed the finding with the  trauma surgeon on call at [facility]. Apparently, there was no surgical indication at the time). Continue supportive treatment. The patient was actually able to stand today with physical therapy.  Pelvic fractures/acute and mildly displaced fractures of both superior and inferior pubic rami on the left; status post fall at home.. Dr. Romeo Apple has seen and evaluated the patient. We'll progress the patient to physical therapy as tolerated, given stability of hemoglobin and hematocrit and slight clinical improvement.  Abdominal pain secondary to above. Also, the patient was constipated. She is on Senokot, MiraLax, and as needed Dulcolax positive her. Finally, she has had several bowel movements. She feels better.  Coagulopathy/anticoagulation secondary to chronic atrial fibrillation. Xarelto and aspirin have both been discontinued in the setting of active bleeding. She is status post vitamin K and 4 units of fresh frozen plasma. Her INR is now within normal limits at 1.22. We'll continue to hold anticoagulation for now.  Anemia associated hemorrhage/acute blood loss. The patient's hemoglobin was 12.2 on admission and has just it down to a nadir of 5.0. She is status post 4 units of packed red blood cells. Her hemoglobin increased to 9.7. It has been vacillating since then, but  appears to be stabilizing.  Thrombocytopenia. Likely secondary to consumption. Now resolved.  Hypotension secondary to acute blood loss. Also, the patient reports chronically low normal blood pressures in the upper 80s to low 90s systolically. With IV fluid hydration and transfusion of blood products, her blood pressure is better; still low normal.. Her cortisol level is within normal limits.  Brief syncope secondary to hypotension. No focal neurological abnormalities noted.  Chronic atrial fibrillation.  She is currently on digoxin. Every 6 hour dosing of Cardizem was started due to soft blood pressures. (Once daily Cardizem is on  hold). We'll hold diltiazem for systolic blood pressures less than 90.  Chronic diastolic congestive heart failure. Her chest x-ray on 04/24/2012 revealed a little bit more vascular congestion. Her proBNP is elevated now, but it was within normal limits.  Will restart Lasix as tolerated.  Her ejection fraction is 60%. Her troponin I was neg.  COPD.  Bronchodilator therapy was restarted with Xopenex. She is on albuterol inhaler chronically.   Plan:  1. We'll decrease IV fluids cautiously. Increase Lasix to home dose cautiously. 2. Resume physical therapy as tolerated. 3. Possible discharge to skilled nursing facility tomorrow.      LOS: 5 days   Deborah [name] 04/26/2012, 11:46 AM

## 2012-04-27 ENCOUNTER — Inpatient Hospital Stay (HOSPITAL_COMMUNITY): Payer: Medicare Other

## 2012-04-27 DIAGNOSIS — R509 Fever, unspecified: Secondary | ICD-10-CM | POA: Diagnosis not present

## 2012-04-27 DIAGNOSIS — X58XXXA Exposure to other specified factors, initial encounter: Secondary | ICD-10-CM

## 2012-04-27 LAB — URINALYSIS, ROUTINE W REFLEX MICROSCOPIC
Glucose, UA: NEGATIVE mg/dL
Hgb urine dipstick: NEGATIVE
Ketones, ur: NEGATIVE mg/dL
Leukocytes, UA: NEGATIVE
Protein, ur: NEGATIVE mg/dL
pH: 8 (ref 5.0–8.0)

## 2012-04-27 LAB — CBC
HCT: 25.7 % — ABNORMAL LOW (ref 36.0–46.0)
HCT: 28 % — ABNORMAL LOW (ref 36.0–46.0)
Hemoglobin: 8.4 g/dL — ABNORMAL LOW (ref 12.0–15.0)
MCV: 89.5 fL (ref 78.0–100.0)
Platelets: 202 10*3/uL (ref 150–400)
RBC: 2.88 MIL/uL — ABNORMAL LOW (ref 3.87–5.11)
RBC: 3.13 MIL/uL — ABNORMAL LOW (ref 3.87–5.11)
WBC: 6.3 10*3/uL (ref 4.0–10.5)
WBC: 8.7 10*3/uL (ref 4.0–10.5)

## 2012-04-27 LAB — BASIC METABOLIC PANEL
BUN: 11 mg/dL (ref 6–23)
CO2: 28 mEq/L (ref 19–32)
Chloride: 103 mEq/L (ref 96–112)
Glucose, Bld: 102 mg/dL — ABNORMAL HIGH (ref 70–99)
Potassium: 3.8 mEq/L (ref 3.5–5.1)
Sodium: 136 mEq/L (ref 135–145)

## 2012-04-27 MED ORDER — DIGOXIN 125 MCG PO TABS: 0.1250 mg | ORAL_TABLET | Freq: Two times a day (BID) | ORAL | Status: DC

## 2012-04-27 MED ORDER — VANCOMYCIN HCL IN DEXTROSE 1-5 GM/200ML-% IV SOLN
1000.0000 mg | Freq: Once | INTRAVENOUS | Status: AC
  Administered 2012-04-27: 1000 mg via INTRAVENOUS
  Filled 2012-04-27: qty 200

## 2012-04-27 MED ORDER — POTASSIUM CHLORIDE CRYS ER 20 MEQ PO TBCR
20.0000 meq | EXTENDED_RELEASE_TABLET | Freq: Every day | ORAL | Status: DC
  Administered 2012-04-28 – 2012-05-04 (×7): 20 meq via ORAL
  Filled 2012-04-27 (×7): qty 1

## 2012-04-27 MED ORDER — DILTIAZEM HCL 30 MG PO TABS: 30.0000 mg | ORAL_TABLET | Freq: Four times a day (QID) | ORAL | Status: DC

## 2012-04-27 MED ORDER — DILTIAZEM HCL ER COATED BEADS 120 MG PO CP24
120.0000 mg | ORAL_CAPSULE | Freq: Every day | ORAL | Status: DC
  Filled 2012-04-27 (×2): qty 1

## 2012-04-27 MED ORDER — DIGOXIN 125 MCG PO TABS
0.1250 mg | ORAL_TABLET | Freq: Two times a day (BID) | ORAL | Status: DC
  Administered 2012-04-27: 0.125 mg via ORAL
  Filled 2012-04-27 (×2): qty 1

## 2012-04-27 MED ORDER — FUROSEMIDE 20 MG PO TABS: 10.0000 mg | ORAL_TABLET | Freq: Two times a day (BID) | ORAL | Status: DC

## 2012-04-27 MED ORDER — OXYCODONE HCL 5 MG PO TABS: 5.0000 mg | ORAL_TABLET | ORAL | Status: DC | PRN

## 2012-04-27 MED ORDER — MIDODRINE HCL 2.5 MG PO TABS: 2.5000 mg | ORAL_TABLET | Freq: Every day | ORAL | Status: DC

## 2012-04-27 MED ORDER — DILTIAZEM HCL 30 MG PO TABS
30.0000 mg | ORAL_TABLET | Freq: Four times a day (QID) | ORAL | Status: DC
  Administered 2012-04-27 – 2012-04-28 (×2): 30 mg via ORAL
  Filled 2012-04-27 (×2): qty 1

## 2012-04-27 MED ORDER — VANCOMYCIN HCL IN DEXTROSE 1-5 GM/200ML-% IV SOLN: 1000.0000 mg | INTRAVENOUS | Status: DC

## 2012-04-27 MED ORDER — VANCOMYCIN HCL IN DEXTROSE 1-5 GM/200ML-% IV SOLN
1000.0000 mg | INTRAVENOUS | Status: DC
  Administered 2012-04-28 – 2012-05-01 (×4): 1000 mg via INTRAVENOUS
  Filled 2012-04-27 (×4): qty 200

## 2012-04-27 MED ORDER — POTASSIUM CHLORIDE CRYS ER 20 MEQ PO TBCR
30.0000 meq | EXTENDED_RELEASE_TABLET | Freq: Every day | ORAL | Status: AC
  Administered 2012-04-27: 30 meq via ORAL
  Filled 2012-04-27: qty 1

## 2012-04-27 MED ORDER — FUROSEMIDE 20 MG PO TABS
10.0000 mg | ORAL_TABLET | Freq: Two times a day (BID) | ORAL | Status: DC
  Administered 2012-04-27 – 2012-04-29 (×5): 10 mg via ORAL
  Filled 2012-04-27: qty 2
  Filled 2012-04-27 (×5): qty 1

## 2012-04-27 MED ORDER — MIDODRINE HCL 5 MG PO TABS
2.5000 mg | ORAL_TABLET | Freq: Every day | ORAL | Status: DC
  Administered 2012-04-28: 2.5 mg via ORAL
  Filled 2012-04-27: qty 0.5
  Filled 2012-04-27: qty 1
  Filled 2012-04-27 (×2): qty 0.5
  Filled 2012-04-27 (×3): qty 1

## 2012-04-27 MED ORDER — LEVALBUTEROL HCL 0.63 MG/3ML IN NEBU: 0.6300 mg | INHALATION_SOLUTION | Freq: Two times a day (BID) | RESPIRATORY_TRACT | Status: DC

## 2012-04-27 MED ORDER — LEVALBUTEROL HCL 0.63 MG/3ML IN NEBU
0.6300 mg | INHALATION_SOLUTION | Freq: Two times a day (BID) | RESPIRATORY_TRACT | Status: DC
  Administered 2012-04-27 – 2012-04-28 (×4): 0.63 mg via RESPIRATORY_TRACT
  Filled 2012-04-27 (×4): qty 3

## 2012-04-27 NOTE — Progress Notes (Signed)
ANTIBIOTIC CONSULT NOTE - INITIAL  Pharmacy Consult for Vancomycin Indication: febrile illness in ICU (empiric Rx)  Allergies  Allergen Reactions  . Omnipaque (Iohexol) Shortness Of Breath and Other (See Comments)    Short of breath with chest tightness after IV injection in CT, pt was fine when she left department but developed symptoms in parking lot and went to emergency department.  . Carbamazepine     REACTION: toxemia  . Tramadol Other (See Comments)    Unknown     Patient Measurements: Height: 5\' 4"  (162.6 cm) Weight: 139 lb 15.9 oz (63.5 kg) IBW/kg (Calculated) : 54.7   Vital Signs: Temp: 101.5 F (38.6 C) (12/06 0444) Temp src: Oral (12/06 0444) BP: 80/60 mmHg (12/06 1036) Pulse Rate: 113  (12/06 1000) Intake/Output from previous day: 12/05 0701 - 12/06 0700 In: 1756 [P.O.:780; I.V.:976] Out: 3250 [Urine:3250] Intake/Output from this shift: Total I/O In: -  Out: 300 [Urine:300]  Labs:  [facility] 04/27/12 0306 04/27/12 0238 04/26/12 1831 04/26/12 1024 04/26/12 0450  WBC -- 6.3 8.5 8.8 --  HGB -- 8.4* 9.2* 9.4* --  PLT -- 164 167 155 --  LABCREA -- -- -- -- --  CREATININE 0.54 -- -- -- 0.49*   Estimated Creatinine Clearance: 46 ml/min (by C-G formula based on Cr of 0.54). No results found for this basename: VANCOTROUGH:2,VANCOPEAK:2,VANCORANDOM:2,GENTTROUGH:2,GENTPEAK:2,GENTRANDOM:2,TOBRATROUGH:2,TOBRAPEAK:2,TOBRARND:2,AMIKACINPEAK:2,AMIKACINTROU:2,AMIKACIN:2, in the last 72 hours   Microbiology: Recent Results (from the past 720 hour(s))  MRSA PCR SCREENING     Status: Normal   Collection Time   04/22/12  9:53 PM      Component Value Range Status Comment   MRSA by PCR NEGATIVE  NEGATIVE Final   CULTURE, BLOOD (ROUTINE X 2)     Status: Normal (Preliminary result)   Collection Time   04/27/12  4:45 AM      Component Value Range Status Comment   Specimen Description Blood PIC LINE   Final    Special Requests NONE 6CC   Final    Culture NO GROWTH <24 HRS    Final    Report Status PENDING   Incomplete   CULTURE, BLOOD (ROUTINE X 2)     Status: Normal (Preliminary result)   Collection Time   04/27/12  4:59 AM      Component Value Range Status Comment   Specimen Description Blood LEFT HAND   Final    Special Requests NONE 6CC   Final    Culture NO GROWTH <24 HRS   Final    Report Status PENDING   Incomplete     Medical History: Past Medical History  Diagnosis Date  . Decreased left ventricular function     EF normalized by Lexiscan 2010: 69%  . Chronic diastolic heart failure   . A-fib     permanent; rate controlled   . COPD (chronic obstructive pulmonary disease)   . Drug intolerance     Flecacinide and Amiodarone   . CHF (congestive heart failure)   . Asthma   . Umbilical hernia   . Coronary artery disease     status post bare-metal stent to the circumflex coronary artery2009  . Chronic anticoagulation     on dabigatran with no complications  . Seizures     Remote history of over 20 years ago  . Anemia due to blood loss, acute 04/23/2012  . Permanent atrial fibrillation 02/10/2009    Qualifier: Diagnosis of  By: Bascom Levels, RMA, Sherri      Medications:  Scheduled:    .  acetaminophen  650 mg Oral Once  . digoxin  0.125 mg Oral Daily  . diltiazem  120 mg Oral Daily  . furosemide  10 mg Oral BID  . levalbuterol  0.63 mg Nebulization BID  . levETIRAcetam  125 mg Oral QPM  . levETIRAcetam  250 mg Oral Daily  . pantoprazole  40 mg Oral Q M,W,F  . polyvinyl alcohol  1 drop Both Eyes QID  . simvastatin  20 mg Oral q1800  . sodium chloride  10-40 mL Intracatheter Q12H  . vancomycin  1,000 mg Intravenous Once  . [DISCONTINUED] diltiazem  30 mg Oral Q6H  . [DISCONTINUED] furosemide  20 mg Oral BID  . [DISCONTINUED] furosemide  20 mg Oral Daily  . [DISCONTINUED] levalbuterol  0.63 mg Nebulization TID  . [DISCONTINUED] polyethylene glycol  17 g Oral Daily  . [DISCONTINUED] senna  1 tablet Oral BID   Assessment: 77yo female in  ICU with febrile illness.  Adding Vancomycin empirically.  Renal fxn OK for age.  Estimated Creatinine Clearance: 46 ml/min (by C-G formula based on Cr of 0.54).  Goal of Therapy:  Vancomycin trough level 15-20 mcg/ml  Plan: Vancomycin 1gm iv q24hrs Check trough at steady state Monitor labs, renal fxn, and cultures per protocol Duration of therapy per MD  Valrie Hart A 04/27/2012,11:28 AM

## 2012-04-27 NOTE — Progress Notes (Addendum)
Subjective: The patient is lying in bed. She complains of diarrhea, after multiple laxatives. She also has some soreness over the pretibial surfaces of both legs. She still has abdominal "soreness", but says it's better. She denies purulent cough or pain with urination although she does have an indwelling Foley catheter.  Objective: Vital signs in last 24 hours: Filed Vitals:   04/27/12 0500 04/27/12 0600 04/27/12 0630 04/27/12 0906  BP: 82/46 73/53 87/48    Pulse:      Temp:      TempSrc:      Resp: 22 18 18    Height:      Weight: 63.5 kg (139 lb 15.9 oz)     SpO2:    98%    Intake/Output Summary (Last 24 hours) at 04/27/12 4098 Last data filed at 04/27/12 0600  Gross per 24 hour  Intake   1470 ml  Output   3250 ml  Net  -1780 ml    Weight change:   Physical exam: General: Pleasant alert 78 year old Caucasian woman laying in bed, in no acute distress. Lungs: Decreased breath sounds in the bases. Breathing is nonlabored. Heart: Irregular, irregular, with borderline tachycardia. Abdomen/pelvis: Less firm, positive bowel sounds,  tender overall but less so  and softer. Extremities: No pedal edema. she has some mild tenderness over the pretibial surfaces of both legs distally, thought to be secondary to the SCDs. No erythema or exquisite warmth.  PICC line right upper quadrant reveal surrounding ecchymosis but no drainage.  Neurologic: She is alert and oriented x2. Cranial nerves II through XII are grossly intact.  Lab Results: Basic Metabolic Panel:  Basename 04/27/12 0306 04/26/12 0450  NA 136 136  K 3.8 4.9  CL 103 105  CO2 28 25  GLUCOSE 102* 95  BUN 11 9  CREATININE 0.54 0.49*  CALCIUM 8.3* 7.5*  MG -- --  PHOS -- --   Liver Function Tests: No results found for this basename: AST:2,ALT:2,ALKPHOS:2,BILITOT:2,PROT:2,ALBUMIN:2 in the last 72 hours No results found for this basename: LIPASE:2,AMYLASE:2 in the last 72 hours No results found for this basename:  AMMONIA:2 in the last 72 hours CBC:  Basename 04/27/12 0238 04/26/12 1831  WBC 6.3 8.5  NEUTROABS -- --  HGB 8.4* 9.2*  HCT 25.7* 27.5*  MCV 89.2 88.7  PLT 164 167   Cardiac Enzymes: No results found for this basename: CKTOTAL:3,CKMB:3,CKMBINDEX:3,TROPONINI:3 in the last 72 hours BNP:  Basename 04/25/12 0426  PROBNP 1721.0*   D-Dimer: No results found for this basename: DDIMER:2 in the last 72 hours CBG: No results found for this basename: GLUCAP:6 in the last 72 hours Hemoglobin A1C: No results found for this basename: HGBA1C in the last 72 hours Fasting Lipid Panel: No results found for this basename: CHOL,HDL,LDLCALC,TRIG,CHOLHDL,LDLDIRECT in the last 72 hours Thyroid Function Tests: No results found for this basename: TSH,T4TOTAL,FREET4,T3FREE,THYROIDAB in the last 72 hours Anemia Panel: No results found for this basename: VITAMINB12,FOLATE,FERRITIN,TIBC,IRON,RETICCTPCT in the last 72 hours Coagulation: No results found for this basename: LABPROT:2,INR:2 in the last 72 hours Urine Drug Screen: Drugs of Abuse  No results found for this basename: labopia,  cocainscrnur,  labbenz,  amphetmu,  thcu,  labbarb    Alcohol Level: No results found for this basename: ETH:2 in the last 72 hours Urinalysis:  Basename 04/27/12 0506  COLORURINE YELLOW  LABSPEC 1.015  PHURINE 8.0  GLUCOSEU NEGATIVE  HGBUR NEGATIVE  BILIRUBINUR NEGATIVE  KETONESUR NEGATIVE  PROTEINUR NEGATIVE  UROBILINOGEN 0.2  NITRITE NEGATIVE  LEUKOCYTESUR NEGATIVE  Misc. Labs:   Micro: Recent Results (from the past 240 hour(s))  MRSA PCR SCREENING     Status: Normal   Collection Time   04/22/12  9:53 PM      Component Value Range Status Comment   MRSA by PCR NEGATIVE  NEGATIVE Final   CULTURE, BLOOD (ROUTINE X 2)     Status: Normal (Preliminary result)   Collection Time   04/27/12  4:45 AM      Component Value Range Status Comment   Specimen Description Blood PIC LINE   Final    Special  Requests NONE 6CC   Final    Culture PENDING   Incomplete    Report Status PENDING   Incomplete   CULTURE, BLOOD (ROUTINE X 2)     Status: Normal (Preliminary result)   Collection Time   04/27/12  4:59 AM      Component Value Range Status Comment   Specimen Description Blood LEFT HAND   Final    Special Requests NONE [facility]   Final    Culture PENDING   Incomplete    Report Status PENDING   Incomplete     Studies/Results: Dg Chest Port 1 View  04/27/2012  *RADIOLOGY REPORT*  Clinical Data: Fever, history heart failure, COPD, asthma, coronary artery disease  PORTABLE CHEST - 1 VIEW  Comparison: Portable exam 0620 hours compared to 04/24/2012  Findings: Right arm PICC line stable, tip projecting over cavoatrial junction. Enlargement of cardiac silhouette. Calcified tortuous aorta. Mediastinal contours and pulmonary vascularity otherwise normal. No pulmonary infiltrate, pleural effusion or pneumothorax. Bilateral cervical ribs versus hypoplastic 1st ribs. Underlying emphysematous and bronchitic changes.  IMPRESSION: Enlargement of cardiac silhouette. Emphysematous bronchitic changes. No acute abnormalities. Improvement in aeration at right base since previous exam.   Original Report Authenticated By: Ulyses Southward, M.D.      2-D echo: Study Conclusions:   - Left ventricle: The cavity size was normal. There was mild concentric hypertrophy. Systolic function was normal. The estimated ejection fraction was in the range of 60% to 65%. Wall motion was normal; there were no regional wall motion abnormalities. - Aortic valve: Mildly calcified annulus. Mildly calcified leaflets. - Mitral valve: Calcified annulus. - Left atrium: The atrium was moderately dilated. - Right atrium: The atrium was mildly to moderately dilated. - Atrial septum: No defect or patent foramen ovale was identified. Transthoracic echocardiography. M-mode, complete 2D, spectral Doppler, and color Doppler. Height: Height: 162.6cm.  Height: 64in. Weight: Weight: 63.5kg. Weight: 139.7lb. Body mass index: BMI: 24kg/m^2. Body surface area: BSA: 1.80m^2. Patient status: Inpatient. Location: ICU/CCU    Medications:  Scheduled:    . acetaminophen  650 mg Oral Once  . digoxin  0.125 mg Oral Daily  . diltiazem  30 mg Oral Q6H  . furosemide  10 mg Oral BID  . levalbuterol  0.63 mg Nebulization BID  . levETIRAcetam  125 mg Oral QPM  . levETIRAcetam  250 mg Oral Daily  . pantoprazole  40 mg Oral Q M,W,F  . polyvinyl alcohol  1 drop Both Eyes QID  . simvastatin  20 mg Oral q1800  . sodium chloride  10-40 mL Intracatheter Q12H  . [DISCONTINUED] furosemide  20 mg Oral BID  . [DISCONTINUED] furosemide  20 mg Oral Daily  . [DISCONTINUED] levalbuterol  0.63 mg Nebulization TID  . [DISCONTINUED] polyethylene glycol  17 g Oral Daily  . [DISCONTINUED] senna  1 tablet Oral BID   Continuous:    . 0.9 % NaCl with  KCl 20 mEq / L 40 mL/hr at 04/26/12 1800   ZOX:[id number], albuterol, bisacodyl, bisacodyl, morphine injection, ondansetron (ZOFRAN) IV, ondansetron, oxyCODONE, sodium chloride, traZODone  Assessment: Principal Problem:  *Fracture of Left superior and inferior pubic rami Active Problems:  Traumatic retroperitoneal hematoma  Acute post-hemorrhagic anemia  Hemorrhagic shock  Permanent atrial fibrillation  DIASTOLIC HEART FAILURE, CHRONIC  Chronic anticoagulation  Coronary artery disease  COPD (chronic obstructive pulmonary disease)  Fall  Macular degeneration  RLQ abdominal pain  Hypotension  Syncope  Thrombocytopenia     Fever: The patient developed a fever overnight. Her chest x-ray is unremarkable. Her urinalysis is unremarkable. Blood cultures were ordered this morning. We'll order a followup noncontrasted CT scan of her abdomen and pelvis and bilateral lower extremity venous ultrasound to rule out DVT. Following vancomycin for the next 24 hours, would discontinue PICC line if she remains  afebrile.   Newly diagnosed large pelvic hematoma as well as retroperitoneal hemorrhage and hemorrhage into the anterior abdominal wall in the setting of pelvic fracture as an anti-coagulation. Orthopedic surgeon Dr. Romeo Apple was consulted. His recommendations noted. General surgeon, Dr. Lovell Sheehan was consulted. His recommendations noted and appreciated. No surgical indication currently. (The patient was discussed with my colleague Dr. Orvan Falconer who discussed the finding with the trauma surgeon on call at [facility]. Apparently, there was no surgical indication at the time). Continue supportive treatment. The patient was actually able to stand yesterday  with physical therapy.  Pelvic fractures/acute and mildly displaced fractures of both superior and inferior pubic rami on the left; status post fall at home.. Dr. Romeo Apple has seen and evaluated the patient. We'll progress the patient to physical therapy as tolerated, given stability of hemoglobin and hematocrit and slight clinical improvement.  Abdominal pain secondary to above. Also, the patient was constipated. She is on Senokot, MiraLax, and as needed Dulcolax positive her. Finally, she has had several bowel movements. She feels better.  Coagulopathy/anticoagulation secondary to chronic atrial fibrillation. Xarelto and aspirin have both been discontinued in the setting of active bleeding. She is status post vitamin K and 4 units of fresh frozen plasma. Her INR is now within normal limits at 1.22. We'll continue to hold anticoagulation indefinitely due to the severe bleeding and fall risk. Consider restarting aspirin in a couple of weeks.  Anemia associated hemorrhage/acute blood loss. The patient's hemoglobin was 12.2 on admission and has just it down to a nadir of 5.0. She is status post 4 units of packed red blood cells. Her hemoglobin increased to a high of 9.7. It has been vacillating since then, but appears to be stabilizing above 8 g  .  Thrombocytopenia. Likely secondary to consumption. Now resolved.  Hypotension secondary to acute blood loss. Also, the patient reports chronically low normal blood pressures in the upper 80s to low 90s systolically. With IV fluid hydration and transfusion of blood products, her blood pressure is better; still low normal.. Her cortisol level is within normal limits.  Brief syncope secondary to hypotension. No focal neurological abnormalities noted.  Chronic atrial fibrillation.  She is currently on digoxin. Every 6 hour dosing of Cardizem was started due to soft blood pressures. Once daily diltiazem is on hold, but will be restarted and every 6 hour dosing will be discontinued.   Chronic diastolic congestive heart failure. Her chest x-ray on 04/24/2012 revealed a little bit more vascular congestion. Her proBNP is elevated now, but it was within normal limits.  Lasix was  as tolerated.  Her ejection  fraction is 60%. Her troponin I was neg.  COPD.  Bronchodilator therapy was restarted with Xopenex. She is on albuterol inhaler chronically.   Plan:  1. Evaluation of fever continues. Blood cultures pending. Bilateral lower extremity venous duplex ultrasound pending. Followup CT scan of the abdomen and pelvis pending. 2. Start vancomycin empirically. If over the next 24 hours, she remains afebrile, would discontinue PICC line in the morning and start her on doxycycline empirically. 3. Discontinue Foley catheter. 4. Delay discharge to skilled nursing facility, pending fever workup. 5. Continue physical therapy as tolerated. 6. Discontinue every 6 hour dosing of Cardizem in favor of home Cardizem dose of 120 mg daily.  7. Would discontinue anticoagulation indefinitely due to severe bleeding and fall risk.. Consider restarting aspirin a couple weeks. This was discussed with the patient's daughter who agreed.     LOS: 6 days   Joyce Leckey 04/27/2012, 9:24 AM

## 2012-04-27 NOTE — Discharge Summary (Addendum)
Physician Discharge Summary  Deborah [name] ZOX:[id number] DOB: [birth year] DOA: 04/21/2012  PCP: Ignatius Specking., MD  Admit date: 04/21/2012 Discharge date: 04/28/2012  Time spent: Greater than 30 minutes  Recommendations for Outpatient Follow-up:  1. The plan is for the patient to be discharged to the [facility] tomorrow, 04/28/2012. 2. Remove PICC after Vancomycin is discontinued.  Discharge Diagnoses:  1. Acute fractures of the left superior and inferior pubic rami, secondary to fall. 2. Traumatic retroperitoneal hemorrhage, large pelvic hematoma, and rectus muscles hematoma, secondary to fall in the setting of pelvic fracture and anticoagulation with Xarelto. 3. Acute post hemorrhagic anemia, status post a total of 4 units of packed red blood cell transfusions. 4. Chronic atrial fibrillation.        A) Anticoagulation has been discontinued indefinitely because of serious bleeding and risk of fall.       B.) Aspirin could be conceivably restarted in 2-3 weeks if her hemoglobin remains stable.       C.) Cardizem dosing was changed from 120 mg daily to 30 mg every 6 hours due to the patient's chronically low blood pressures.       D.) Digoxin dosing was changed to twice a day temporarily, but it can be changed back to once daily as tolerated. 5. Hypotension secondary to hemorrhagic shock. 6. Chronically low blood pressures with her systolic blood pressures generally ranging in the upper 80s to low 90s without being symptomatic. 7. Syncope, secondary to hemorrhagic shock.. 8. Thrombocytopenia, secondary to consumption. Resolved. 9. Chronic diastolic congestive heart failure. 10. Coronary artery disease. Remains stable. 11. COPD. 12. Fever. No obvious etiology, but will continue IV vancomycin for several days empirically before it is discontinued.  Discharge Condition: Improved.  Diet recommendation: Heart healthy.  Filed Weights   04/21/12 1727 04/27/12 0500  Weight: 63.504 kg (140  lb) 63.5 kg (139 lb 15.9 oz)    History of present illness:  The patient is an 78 year old woman with a history significant for COPD, coronary artery disease, and chronic atrial fibrillation with chronic anticoagulation with Xarelto, who presented to the emergency department on 04/21/2012 after falling at home. Apparently, she got her feet tangled in an electrical cord and fell. In the emergency department, her blood pressure was low normal, but otherwise, she was afebrile and hemodynamically stable. X-ray of her sacrum/coccyx revealed no acute fracture. However, x-ray of her left hip revealed acute and mildly displaced fractures of both the superior and inferior pubic rami on the left. She was admitted for further evaluation and management.  Hospital Course:   Acute pubic rami fractures and retroperitoneal hemorrhage/acute blood loss anemia: The patient was started on supportive treatment. As needed analgesics were ordered for pain. The physical therapist was consulted. Because of her ongoing abdominal pain, a CT scan of her abdomen was ordered to rule out other etiologies that could not be explained by the fractures. The CT scan revealed  pelvic fractures with a large pelvic hematoma as well as retroperitoneal hemorrhage and hemorrhage into the anterior abdominal wall. Dr. Orvan Falconer discussed the findings with the trauma surgeon on call. Per their recommendation, supportive treatment was continued as there was no surgical indication. Her hemoglobin fell to 5 g.  She was transferred to the ICU. 4 units of packed red blood cells were typed and crossed. Over a 36 hour period, all 4 units were transfused. IV vitamin K was given as well as 4 units of fresh frozen plasma. Her INR which had been initially  elevated, normalized.  IV fluids were also given. Her hemoglobin and hematocrit were monitored closely. Diltiazem was discontinued due to hypotension. Physical therapy was temporary placed on hold. Her  hemoglobin improved to a high of 9.7. However, since that time,  It has vacillated from 8.5-9.5, but it has been holding above 8 g. Orthopedic surgeon Dr. Romeo Apple was consulted. His recommendations were to keep the patient at bed rest for a day and then increase activity with physical therapy as tolerated. Therefore weightbearing as tolerated was initiated. Also, Dr. Romeo Apple consulted general surgeon Dr. Lovell Sheehan for his recommendations regarding the traumatic hematomas. He also recommended supportive care as there was no surgical intervention needed. The patient began to weight-bear with physical therapy. She will continue rehabilitation at the [facility].  Chronic hypotension. The patient has chronically low blood pressures. This was confirmed by her daughter. She was hydrated appropriately during the hospitalization. Nevertheless, her blood pressures tended to stay in the mid to upper 80s to low 90s systolically. Diltiazem was eventually restarted for heart rate control, but was changed to every 6 hour dosing. Parameters were set to hold the diltiazem for systolic blood pressure of less than 87. Midodrine was started to help with blood pressure support while the patient was being treated for atrial fibrillation with diltiazem. Midodrine can be titrated up to twice a day.   Chronic atrial fibrillation. The patient's heart rate had been mostly controlled, but started to increase to the low 100s. As stated above, diltiazem was restarted, but with parameters. She was maintained on digoxin. However, digoxin dosing was increased to twice a day although this is not traditional. Once her heart rate is consistently below 100, digoxin can be titrated back to once daily.  Xarelto was discontinued indefinitely due to to her serious bleeding and risk of fall. This was discussed with her daughter who agrees. Aspirin could conceivably be restarted in a few weeks, but with caution.   Fever. The patient developed a fever.  For further evaluation, a number of studies were ordered. Her urinalysis was unremarkable. Her chest x-ray revealed no infiltrates. Bilateral lower extremity venous ultrasound revealed no DVT. CT scan of her abdomen and pelvis revealed some improvement in the hematoma, but no obvious signs of an infectious process. Blood cultures have been ordered, but the results are pending. Because a PICC line was inserted during this hospitalization, she was started empirically on vancomycin. It will be continued for the next few days. It can be discontinued at the skilled nursing facility if her blood cultures remain negative and if there are no obvious infectious etiology for her fever. Her Foley catheter was recently discontinued.  Syncope. The patient's syncope resulted from acute blood loss and hypotension. She had no neurological deficits. Following volume resuscitation, she had no complaints of presyncopal symptoms.  Chronic diastolic heart failure. The patient's chest x-ray on 04/24/2012 revealed vascular congestion. Her pro BNP did come elevated, but it was not completely suggestive of decompensated heart failure. A 2-D echocardiogram was ordered. It revealed an ejection fraction of 60%. Her troponin I was negative. Lasix however was restarted with caution.  COPD. The patient was maintained on bronchodilator therapy.  Loose stools. The patient had been constipated for days. She was given multiple laxatives and a suppository. Over the past couple days, she has had numerous loose stools. All of her laxatives were discontinued.    Procedures:  1) 2-D echocardiogram conclusions   - Left ventricle: The cavity size was normal. There was  mild concentric hypertrophy. Systolic function was normal. The estimated ejection fraction was in the range of 60% to 65%. Wall motion was normal; there were no regional wall motion abnormalities. - Aortic valve: Mildly calcified annulus. Mildly calcified leaflets. -  Mitral valve: Calcified annulus. - Left atrium: The atrium was moderately dilated. - Right atrium: The atrium was mildly to moderately dilated. - Atrial septum: No defect or patent foramen ovale was identified. Transthoracic echocardiography. M-mode, complete 2D, spectral Doppler, and color Doppler. Height: Height: 162.6cm. Height: 64in. Weight: Weight: 63.5kg. Weight: 139.7lb. Body mass index: BMI: 24kg/m^2. Body surface area: BSA: 1.68m^2. Patient status: Inpatient. Location: ICU/CCU  2) right upper extremity PICC line    Consultations:  Orthopedic surgeon Fuller Canada, M.D.  General surgeon, Franky Macho M.D.  Discharge Exam: Filed Vitals:   04/28/12 0400 04/28/12 0500 04/28/12 0508 04/28/12 0600  BP: 79/52 98/29 98/29  93/60  Pulse: 114 141  105  Temp: 98.3 F (36.8 C)     TempSrc: Oral     Resp: 16 21  19   Height:      Weight:      SpO2: 95% 97%  96%    Exam: See previous exam dictated earlier. No changes.  Discharge Instructions      Discharge Orders    Future Appointments: Provider: Department: Dept Phone: Center:   07/05/2012 10:40 AM Jonelle Sidle, MD [place] [facility] ([facility] [employer] [phone] [email or web address]     Future Orders Please Complete By Expires   Diet - low sodium heart healthy      Increase activity slowly      Discharge instructions      Comments:   WEIGHTBEARING AS TOLERATED. FOLLOWING THE DISCONTINUATION OF IV VANCOMYCIN,   DISCONTINUE PICC LINE.       Medication List     As of 04/28/2012  7:14 AM    STOP taking these medications         aspirin 81 MG EC tablet      diltiazem 120 MG 24 hr capsule   Commonly known as: CARDIZEM CD      ergocalciferol 50000 UNITS capsule   Commonly known as: VITAMIN D2      FIBERCHOICE 2 G Chew   Generic drug: Inulin      isosorbide mononitrate 30 MG 24 hr tablet   Commonly known as: IMDUR      PRADAXA 150 MG Caps   Generic drug: dabigatran      XARELTO 20 MG Tabs   Generic  drug: Rivaroxaban      TAKE these medications         acetaminophen 325 MG tablet   Commonly known as: TYLENOL   Take 650 mg by mouth every 6 (six) hours as needed. For pain      ALIGN 4 MG Caps   Take 1 capsule by mouth daily.      CALTRATE 600+D PLUS 600-400 MG-UNIT per tablet   Take 1 tablet by mouth 2 (two) times daily.      digoxin 0.125 MG tablet   Commonly known as: LANOXIN   Take 1 tablet (0.125 mg total) by mouth 2 (two) times daily.      diltiazem 30 MG tablet   Commonly known as: CARDIZEM   Take 1 tablet (30 mg total) by mouth every 6 (six) hours.      furosemide 20 MG tablet   Commonly known as: LASIX   Take 0.5 tablets (10 mg total) by mouth 2 (two) times  daily.      hydrocortisone valerate cream 0.2 %   Commonly known as: WESTCORT   Apply 1 application topically as needed. For rash      ICAPS Caps   Take 1 capsule by mouth 2 (two) times daily.      levalbuterol 0.63 MG/3ML nebulizer solution   Commonly known as: XOPENEX   Take 3 mLs (0.63 mg total) by nebulization 2 (two) times daily.      levETIRAcetam 250 MG tablet   Commonly known as: KEPPRA   Takes one tablet in the morning and half a tablet at 3 pm.      midodrine 2.5 MG tablet   Commonly known as: PROAMATINE   Take 1 tablet (2.5 mg total) by mouth at bedtime.      omeprazole 20 MG tablet   Commonly known as: PRILOSEC OTC   Take 20 mg by mouth 3 (three) times a week. Takes on Monday, Wednesday, & Friday      oxyCODONE 5 MG immediate release tablet   Commonly known as: Oxy IR/ROXICODONE   Take 1 tablet (5 mg total) by mouth every 4 (four) hours as needed.      potassium chloride 10 MEQ CR tablet   Commonly known as: KLOR-CON   Take 10 mEq by mouth daily.      pravastatin 40 MG tablet   Commonly known as: PRAVACHOL   Take 40 mg by mouth daily.      THERATEARS OP   Place 4 drops into both eyes 4 (four) times daily.      vancomycin 1 GM/200ML Soln   Commonly known as: VANCOCIN   Inject  200 mLs (1,000 mg total) into the vein daily. For 2 more days.      VENTOLIN HFA 108 (90 BASE) MCG/ACT inhaler   Generic drug: albuterol   Inhale 2 puffs into the lungs 4 (four) times daily as needed. Shortness of breath           The results of significant diagnostics from this hospitalization (including imaging, microbiology, ancillary and laboratory) are listed below for reference.    Significant Diagnostic Studies: Ct Abdomen Pelvis Wo Contrast  04/27/2012  *RADIOLOGY REPORT*  Clinical Data: Fever.  Follow up retroperitoneal hematoma  CT ABDOMEN AND PELVIS WITHOUT CONTRAST  Technique:  Multidetector CT imaging of the abdomen and pelvis was performed following the standard protocol without intravenous contrast.  Comparison: 04/22/2012  Findings: Lung bases:  Interval development of small bilateral pleural effusions.  Heart size is normal.  No pericardial effusion.  Abdomen/pelvis:  There is no focal liver abnormality.  Gallbladder appears normal.  No biliary dilatation.  The pancreas is unremarkable.  The spleen is negative.  Bilateral low attenuation nodules are identified within the adrenal glands.  Unchanged from previous studies.  Likely small adenomas. Mild perinephric fat stranding is noted bilaterally.  This is similar to previous exam.  No hydronephrosis or hydroureter. Right iliac fossa and right pelvic side wall hematoma measures 15 x 8.3 x 7.7 cm.  Previously this measured 15.7 x 9 x 8.7 cm.  This exerts significant mass effect upon the urinary bladder, uterus and adnexal structures. Stable to improved appearance of bilateral retroperitoneal hematomas.  There has been interval decrease in bilateral rectus muscle hematomas.  The stomach and the small bowel loops appear within normal limits. Normal appearance of the colon.  Bones/Musculoskeletal:  Again identified are left inferior and superior pubic rami fractures as well as fractures involving the left  pubic bone.  IMPRESSION:  1.   Stable to improved appearance of large pelvic hematoma, bilateral retroperitoneal hemorrhage, and rectus muscle hematomas. 2.  Stable pelvic fractures. 3.  Small bilateral pleural effusions are new from previous exam.   Original Report Authenticated By: Signa Kell, M.D.    Ct Abdomen Pelvis Wo Contrast  04/22/2012  *RADIOLOGY REPORT*  Clinical Data: Abdominal pain.  Hypotension.Pelvic fractures.  CT ABDOMEN AND PELVIS WITHOUT CONTRAST  Technique:  Multidetector CT imaging of the abdomen and pelvis was performed following the standard protocol without intravenous contrast.  Comparison: Radiographs dated at 04/21/2012 and CT scan dated 05/31/2011  Findings: The patient has a large right-sided pelvic hematoma as well as extensive retroperitoneal hemorrhage and hemorrhage extending into the anterior abdominal wall along both rectus muscles almost to the level of the umbilicus.  The right pelvic hematoma measures approximately 16 x 10 x 10 cm and has a mass effect upon the bladder.  Retroperitoneal hemorrhage extends in both pericolic gutters and in the posterior pararenal spaces.  There is a small amount of fluid over the right lobe of the liver which may be hemorrhage or ascites.  The liver, spleen, pancreas, adrenal glands, and kidneys demonstrate no significant abnormalities.  The bowel is normal except for sigmoid diverticulosis.  Again noted are the fractures of the left inferior superior pubic rami as well as both the left side of the pubic bone.  The hemorrhage appears to originate from the left pubic body fracture.  IMPRESSION:  Pelvic fractures with a large pelvic hematoma as well as retroperitoneal hemorrhage and hemorrhage into the anterior abdominal wall and extending superiorly in the posterior pararenal spaces.  Critical Value/emergent results were called by telephone at the time of interpretation on 04/22/2012 at 8:45 p.m. to Dr. Orvan Falconer, who verbally acknowledged these results.   Original Report  Authenticated By: Francene Boyers, M.D.    Dg Lumbar Spine Complete  04/21/2012  *RADIOLOGY REPORT*  Clinical Data: Fall with sacral pain.  LUMBAR SPINE - COMPLETE 4+ VIEW  Comparison: None  Findings: Lumbar vertebral bodies show normal alignment and no evidence of acute fracture.  No acute sacral fractures are identified.  The sacroiliac joints are symmetric and normally aligned.  IMPRESSION: No acute fractures.   Original Report Authenticated By: Irish Lack, M.D.    Dg Sacrum/coccyx  04/21/2012  *RADIOLOGY REPORT*  Clinical Data: Fall with sacral pain.  SACRUM AND COCCYX - 2+ VIEW  Comparison: None.  Findings: No acute fractures are seen involving the sacrum or coccyx.  Bony evaluation is somewhat limited due to severe osteopenia.  No soft tissue abnormalities are identified.  IMPRESSION: No obvious sacral or coccygeal fractures.   Original Report Authenticated By: Irish Lack, M.D.    Dg Hip Complete Left  04/21/2012  *RADIOLOGY REPORT*  Clinical Data: Fall with left hip and groin pain.  LEFT HIP - COMPLETE 2+ VIEW  Comparison: 08/02/2009  Findings: Mildly displaced and comminuted fractures are seen involving both the superior and inferior pubic rami on the left. There is no evidence of diastasis of the pubic symphysis.  No other acute injuries are identified. There is moderate osteoarthritis with joint space narrowing of both hip joints.  Soft tissues are unremarkable.  IMPRESSION: Acute and mildly displaced fractures of both the superior and inferior pubic rami on the left.   Original Report Authenticated By: Irish Lack, M.D.    US Venous Img Lower Bilateral  04/27/2012  *RADIOLOGY REPORT*  Clinical Data: History of fever.  History of bilateral leg pain.  BILATERAL LOWER EXTREMITY VENOUS DUPLEX ULTRASOUND  Technique:  Gray-scale sonography with graded compression, as well as color Doppler and duplex ultrasound, were performed to evaluate the deep venous system of both lower extremities  from the level of the common femoral vein through the popliteal and proximal calf veins.  Spectral Doppler was utilized to evaluate flow at rest and with distal augmentation maneuvers.  Comparison:  None.  Findings:  Normal compressibility of bilateral common femoral, superficial femoral, and popliteal veins is demonstrated, as well as the visualized proximal calf veins.  No filling defects to suggest DVT on grayscale or color Doppler imaging.  Doppler waveforms show normal direction of venous flow, normal respiratory phasicity and response to augmentation.  IMPRESSION: No evidence of deep venous thrombosis in either leg.   Original Report Authenticated By: Onalee Hua Call    Dg Chest Port 1 View  04/27/2012  *RADIOLOGY REPORT*  Clinical Data: Fever, history heart failure, COPD, asthma, coronary artery disease  PORTABLE CHEST - 1 VIEW  Comparison: Portable exam 0620 hours compared to 04/24/2012  Findings: Right arm PICC line stable, tip projecting over cavoatrial junction. Enlargement of cardiac silhouette. Calcified tortuous aorta. Mediastinal contours and pulmonary vascularity otherwise normal. No pulmonary infiltrate, pleural effusion or pneumothorax. Bilateral cervical ribs versus hypoplastic 1st ribs. Underlying emphysematous and bronchitic changes.  IMPRESSION: Enlargement of cardiac silhouette. Emphysematous bronchitic changes. No acute abnormalities. Improvement in aeration at right base since previous exam.   Original Report Authenticated By: Ulyses Southward, M.D.    Dg Chest Port 1 View  04/24/2012  *RADIOLOGY REPORT*  Clinical Data: Shortness of breath, post blood transfusion  PORTABLE CHEST - 1 VIEW  Comparison: Portable exam 1017 hours compared to 04/23/2012  Findings: Right arm PICC line stable tip projecting over cavoatrial junction. Enlargement of cardiac silhouette with pulmonary vascular congestion. Calcified tortuous aorta. Chronic peribronchial thickening. Questionable right basilar infiltrate.  Remaining lungs clear. No pleural effusion or pneumothorax. Coronary arterial stent noted. Bones demineralized.  IMPRESSION: Enlargement of cardiac silhouette with pulmonary vascular congestion. Chronic bronchitic changes with questionable right basilar infiltrate.   Original Report Authenticated By: Ulyses Southward, M.D.    Dg Chest Port 1 View  04/23/2012  *RADIOLOGY REPORT*  Clinical Data: Line placement  PORTABLE CHEST - 1 VIEW  Comparison: Earlier today at 5:10 hours  Findings: Right-sided PICC line which is difficult to follow centrally.  Followed to at least the level of the low SVC. No pneumothorax.  Borderline cardiomegaly.  No pleural fluid. Clear lungs.  IMPRESSION: Right-sided PICC line which is difficult to follow centrally. Followed to at least the level of the low SVC.  Consider retraction 2.6 cm with repeat radiograph.   Original Report Authenticated By: Jeronimo Greaves, M.D.    Dg Chest Port 1 View  04/23/2012  *RADIOLOGY REPORT*  Clinical Data: Tachycardia.  PORTABLE CHEST - 1 VIEW  Comparison: Chest radiograph performed 04/22/2012  Findings: The lungs are well-aerated and clear.  There is no evidence of focal opacification, pleural effusion or pneumothorax.  The cardiomediastinal silhouette is within normal limits.  Mild apparent right hilar prominence is stable from prior studies.  No acute osseous abnormalities are seen.  IMPRESSION: No acute cardiopulmonary process seen.   Original Report Authenticated By: Tonia Ghent, M.D.    Dg Chest Port 1 View  04/22/2012  *RADIOLOGY REPORT*  Clinical Data: Hypotension  PORTABLE CHEST - 1 VIEW  Comparison: 06/12/2011  Findings: Lungs are clear.  Negative for pneumonia.  Negative for heart failure or effusion.  Heart size is normal.  IMPRESSION: No acute abnormality.   Original Report Authenticated By: Janeece Riggers, M.D.    Dg Chest Port 1v Same Day  04/23/2012  *RADIOLOGY REPORT*  Clinical Data: Repositioning of PICC line.  PORTABLE CHEST - 1 VIEW SAME  DAY  Comparison: Earlier today at 0842 hours.  Findings: 0923 hours.  Right-sided PICC line terminates at the low SVC. No pneumothorax.  Borderline cardiomegaly. No pleural fluid.  Clear lungs.  IMPRESSION: Right-sided PICC line terminating at the low SVC.  No pneumothorax or other acute cardiopulmonary process.   Original Report Authenticated By: Jeronimo Greaves, M.D.     Microbiology: Recent Results (from the past 240 hour(s))  MRSA PCR SCREENING     Status: Normal   Collection Time   04/22/12  9:53 PM      Component Value Range Status Comment   MRSA by PCR NEGATIVE  NEGATIVE Final   CULTURE, BLOOD (ROUTINE X 2)     Status: Normal (Preliminary result)   Collection Time   04/27/12  4:45 AM      Component Value Range Status Comment   Specimen Description Blood PIC LINE   Final    Special Requests NONE 6CC   Final    Culture NO GROWTH <24 HRS   Final    Report Status PENDING   Incomplete   CULTURE, BLOOD (ROUTINE X 2)     Status: Normal (Preliminary result)   Collection Time   04/27/12  4:59 AM      Component Value Range Status Comment   Specimen Description Blood LEFT HAND   Final    Special Requests NONE [facility]   Final    Culture NO GROWTH <24 HRS   Final    Report Status PENDING   Incomplete      Labs: Basic Metabolic Panel:  Lab 04/28/12 1610 04/27/12 0306 04/26/12 0450 04/24/12 0513 04/22/12 0149 04/21/12 1920  NA 135 136 136 136 140 --  K 5.0 3.8 4.9 4.6 4.3 --  CL 105 103 105 103 104 --  CO2 24 28 25 26 28  --  GLUCOSE 386* 102* 95 111* 155* --  BUN 12 11 9 15 16  --  CREATININE 0.53 0.54 0.49* 0.55 0.66 --  CALCIUM 7.8* 8.3* 7.5* 8.4 9.2 --  MG -- -- -- -- -- 2.1  PHOS -- -- -- -- -- --   Liver Function Tests:  Lab 04/24/12 0513 04/21/12 1920  AST 15 21  ALT 9 13  ALKPHOS 53 64  BILITOT 0.6 0.2*  PROT 5.4* 6.2  ALBUMIN 2.9* 3.7   No results found for this basename: LIPASE:5,AMYLASE:5 in the last 168 hours No results found for this basename: AMMONIA:5 in the last 168  hours CBC:  Lab 04/28/12 0450 04/27/12 1710 04/27/12 0238 04/26/12 1831 04/26/12 1024 04/23/12 1034 04/21/12 1920  WBC 8.1 8.7 6.3 8.5 8.8 -- --  NEUTROABS -- -- -- -- -- 8.7* 8.7*  HGB 8.0* 9.3* 8.4* 9.2* 9.4* -- --  HCT 24.8* 28.0* 25.7* 27.5* 28.4* -- --  MCV 90.8 89.5 89.2 88.7 89.0 -- --  PLT 199 202 164 167 155 -- --   Cardiac Enzymes:  Lab 04/23/12 0326 04/22/12 0811 04/22/12 0149 04/21/12 1920  CKTOTAL -- -- -- --  CKMB -- -- -- --  CKMBINDEX -- -- -- --  TROPONINI <0.30 <0.30 <0.30 <0.30   BNP: BNP (last 3 results)  Basename 04/25/12 0426 04/23/12  0326 06/12/11 0311  PROBNP 1721.0* 190.9 474.1*   CBG: No results found for this basename: GLUCAP:5 in the last 168 hours     Signed:  Tommie Dejoseph  [facility] 04/28/2012, 7:14 AM

## 2012-04-27 NOTE — Progress Notes (Signed)
Physical Therapy Treatment Patient Details Name: KAULA KLENKE MRN: [id number] DOB: 1926-05-13 Today's Date: 04/27/2012 Time: 1191-4782 PT Time Calculation (min): 28 min  PT Assessment / Plan / Recommendation Comments on Treatment Session  Co-treatment with OT.  Pt improved  bed mobility and transfer, required mod assistance and vc-ing for hand placement with rolling (pillow between knees) and STS.  Pt reported no increase in pain throughout session.    Follow Up Recommendations        Does the patient have the potential to tolerate intense rehabilitation     Barriers to Discharge        Equipment Recommendations       Recommendations for Other Services    Frequency     Plan      Precautions / Restrictions Precautions Precautions: Fall Restrictions Weight Bearing Restrictions: Yes LLE Weight Bearing: Weight bearing as tolerated       Mobility  Bed Mobility Bed Mobility: Right Sidelying to Sit Right Sidelying to Sit: 3: Mod assist (with pillow between knees) Transfers Transfers: Stand Pivot Transfers Sit to Stand: 3: Mod assist;With upper extremity assist;From bed Stand to Sit: 3: Mod assist;With upper extremity assist;To chair/3-in-1 Stand Pivot Transfers: 3: Mod assist Ambulation/Gait Ambulation/Gait Assistance: Not tested (comment)    Exercises General Exercises - Lower Extremity Ankle Circles/Pumps: AROM;Both;15 reps Quad Sets: AROM;Both;10 reps Gluteal Sets: AROM;Both;10 reps Short Arc Quad: AROM;Both;10 reps Heel Slides: AAROM;Right;10 reps Hip ABduction/ADduction: AAROM;10 reps   PT Diagnosis:    PT Problem List:   PT Treatment Interventions:     PT Goals Acute Rehab PT Goals PT Goal: Supine/Side to Sit - Progress: Progressing toward goal PT Goal: Sit to Supine/Side - Progress: Progressing toward goal PT Goal: Sit to Stand - Progress: Progressing toward goal PT Goal: Stand to Sit - Progress: Progressing toward goal PT Transfer Goal: Bed to  Chair/Chair to Bed - Progress: Progressing toward goal PT Goal: Ambulate - Progress: Not met  Visit Information  Last PT Received On: 04/27/12 Assistance Needed: +2    Subjective Data  Subjective: I am pain free when I'm not moving.   Cognition  Overall Cognitive Status: Appears within functional limits for tasks assessed/performed Arousal/Alertness: Awake/alert Orientation Level: Appears intact for tasks assessed Behavior During Session: [facility] for tasks performed    Balance     End of Session PT - End of Session Equipment Utilized During Treatment: Gait belt Activity Tolerance: Patient tolerated treatment well Patient left: in chair;with chair alarm set Nurse Communication: Mobility status   GP     Juel Burrow 04/27/2012, 2:47 PM

## 2012-04-27 NOTE — Progress Notes (Signed)
Pt had fever 101.5, BP 92/72, HR 84. Pt complained a little bit about  back pain,  but otherwise stable. Dr Rito Ehrlich notified about pt's fever, orders received.

## 2012-04-27 NOTE — Progress Notes (Signed)
Occupational Therapy Treatment Patient Details Name: Deborah [name] MRN: [id number] DOB: 10/19/1925 Today's Date: 04/27/2012 Time: 4010-2725 OT Time Calculation (min): 28 min  OT Assessment / Plan / Recommendation Comments on Treatment Session Co-tx with PT this session. Patient's daughter present at beginning of tx session. Daughter reports that patient will be leaving for [facility] tomorrow. Patient showed improvement with functional transfers this session using rolling walker. Pt was anxious about getting out of bed but was happy once she was in the recliner.                   Plan Discharge plan remains appropriate;Frequency remains appropriate    Precautions / Restrictions Precautions Precautions: Fall Restrictions Weight Bearing Restrictions: Yes LLE Weight Bearing: Weight bearing as tolerated   Pertinent Vitals/Pain No complaints.    ADL  Lower Body Dressing: Performed;+1 Total assistance Lower Body Dressing: Patient Percentage: 0% Where Assessed - Lower Body Dressing: Supine, head of bed up Toilet Transfer: Performed;Moderate assistance Toilet Transfer Method: Stand pivot Toilet Transfer Equipment:  (to recliner) Transfers/Ambulation Related to ADLs: Patient transfers at a Mod Assist level with walker. Increased time needed. Max vc's for direction.      OT Goals ADL Goals ADL Goal: Lower Body Dressing - Progress: Progressing toward goals ADL Goal: Toilet Transfer - Progress: Met  Visit Information  Last OT Received On: 04/27/12 Assistance Needed: +2 PT/OT Co-Evaluation/Treatment: Yes    Subjective Data  Subjective: Per daughter, patient has been up to the commode twice. Patient Stated Goal: None stated      Cognition  Overall Cognitive Status: Appears within functional limits for tasks assessed/performed Arousal/Alertness: Awake/alert Orientation Level: Appears intact for tasks assessed Behavior During Session: [facility] for tasks performed    Mobility   Bed Mobility Bed Mobility: Right Sidelying to Sit Right Sidelying to Sit: 3: Mod assist (with pillow between knees) Transfers Sit to Stand: 3: Mod assist;With upper extremity assist;From bed Stand to Sit: 3: Mod assist;With upper extremity assist;To chair/3-in-1             End of Session OT - End of Session Equipment Utilized During Treatment: Gait belt Activity Tolerance: Patient tolerated treatment well Patient left: in chair;with call bell/phone within reach    [employer], OTR/L 04/27/2012, 3:03 PM

## 2012-04-27 NOTE — Progress Notes (Signed)
[facility]. Pharmacist stated nursing supervisor would have to check for it. Per nursing supervisor medication is not available. I paged Dr. Sherrie Mustache to let her know; however, I did let Dr. Rito Ehrlich know because Dr. Sherrie Mustache was wanting patient to have medication tonight.

## 2012-04-27 NOTE — Clinical Social Work Note (Signed)
Pt developed fever. PNC updated. Aware pt currently has PICC line. If stable Saturday, okay for transfer to SNF. MD aware.   Derenda Fennel, [place] [phone]

## 2012-04-28 DIAGNOSIS — D62 Acute posthemorrhagic anemia: Secondary | ICD-10-CM

## 2012-04-28 LAB — URINALYSIS, ROUTINE W REFLEX MICROSCOPIC
Glucose, UA: NEGATIVE mg/dL
Leukocytes, UA: NEGATIVE
Protein, ur: NEGATIVE mg/dL
Specific Gravity, Urine: 1.01 (ref 1.005–1.030)
pH: 6.5 (ref 5.0–8.0)

## 2012-04-28 LAB — BASIC METABOLIC PANEL
BUN: 12 mg/dL (ref 6–23)
CO2: 24 mEq/L (ref 19–32)
GFR calc non Af Amer: 85 mL/min — ABNORMAL LOW (ref >90)
Glucose, Bld: 386 mg/dL — ABNORMAL HIGH (ref 70–99)
Potassium: 5 mEq/L (ref 3.5–5.1)
Sodium: 135 mEq/L (ref 135–145)

## 2012-04-28 LAB — CBC
HCT: 24.8 % — ABNORMAL LOW (ref 36.0–46.0)
HCT: 27.8 % — ABNORMAL LOW (ref 36.0–46.0)
Hemoglobin: 8 g/dL — ABNORMAL LOW (ref 12.0–15.0)
Hemoglobin: 9.3 g/dL — ABNORMAL LOW (ref 12.0–15.0)
MCH: 30.1 pg (ref 26.0–34.0)
MCHC: 32.3 g/dL (ref 30.0–36.0)
MCHC: 33.5 g/dL (ref 30.0–36.0)
MCV: 90 fL (ref 78.0–100.0)
RBC: 2.73 MIL/uL — ABNORMAL LOW (ref 3.87–5.11)
RBC: 3.09 MIL/uL — ABNORMAL LOW (ref 3.87–5.11)

## 2012-04-28 LAB — URINE MICROSCOPIC-ADD ON

## 2012-04-28 LAB — GLUCOSE, CAPILLARY: Glucose-Capillary: 124 mg/dL — ABNORMAL HIGH (ref 70–99)

## 2012-04-28 MED ORDER — DIGOXIN 125 MCG PO TABS
0.1250 mg | ORAL_TABLET | Freq: Every day | ORAL | Status: DC
  Administered 2012-04-28 – 2012-04-30 (×3): 0.125 mg via ORAL
  Filled 2012-04-28 (×2): qty 1

## 2012-04-28 MED ORDER — DILTIAZEM HCL 60 MG PO TABS
60.0000 mg | ORAL_TABLET | Freq: Four times a day (QID) | ORAL | Status: DC
  Administered 2012-04-28 (×2): 60 mg via ORAL
  Filled 2012-04-28 (×2): qty 1

## 2012-04-28 MED ORDER — DIGOXIN 125 MCG PO TABS: 0.1250 mg | ORAL_TABLET | Freq: Every day | ORAL | Status: DC

## 2012-04-28 MED ORDER — VANCOMYCIN HCL IN DEXTROSE 1-5 GM/200ML-% IV SOLN: 1000.0000 mg | INTRAVENOUS | Status: DC

## 2012-04-28 NOTE — Progress Notes (Signed)
[facility]             Progress Note   Subjective: Patient denies any chest pain, lightheadedness, shortness of breath.  Pain is reasonably controlled.  She has been tachycardic. Had 6-7 bowel movements yesterday, one today.  Objective: Vital signs in last 24 hours: Temp:  [98.3 F (36.8 C)-98.8 F (37.1 C)] 98.3 F (36.8 C) (12/07 0400) Pulse Rate:  [99-141] 105  (12/07 0600) Resp:  [15-26] 19  (12/07 0600) BP: (79-139)/(29-97) 93/60 mmHg (12/07 0600) SpO2:  [93 %-100 %] 96 % (12/07 0730) Weight change:  Last BM Date: 04/27/12  Intake/Output from previous day: 12/06 0701 - 12/07 0700 In: 2305.7 [P.O.:875; I.V.:1430.7] Out: 925 [Urine:925]     Physical Exam: General: Alert, awake, oriented x3, in no acute distress. HEENT: No bruits, no goiter. Heart: irrregular Lungs: Clear to auscultation bilaterally. Abdomen: Soft, nontender, nondistended, positive bowel sounds. Extremities: No clubbing cyanosis or edema with positive pedal pulses. Neuro: Grossly intact, nonfocal.    Lab Results: Basic Metabolic Panel:  Basename 04/28/12 0450 04/27/12 0306  NA 135 136  K 5.0 3.8  CL 105 103  CO2 24 28  GLUCOSE 386* 102*  BUN 12 11  CREATININE 0.53 0.54  CALCIUM 7.8* 8.3*  MG -- --  PHOS -- --   Liver Function Tests: No results found for this basename: AST:2,ALT:2,ALKPHOS:2,BILITOT:2,PROT:2,ALBUMIN:2 in the last 72 hours No results found for this basename: LIPASE:2,AMYLASE:2 in the last 72 hours No results found for this basename: AMMONIA:2 in the last 72 hours CBC:  Basename 04/28/12 0450 04/27/12 1710  WBC 8.1 8.7  NEUTROABS -- --  HGB 8.0* 9.3*  HCT 24.8* 28.0*  MCV 90.8 89.5  PLT 199 202   Cardiac Enzymes: No results found for this basename: CKTOTAL:3,CKMB:3,CKMBINDEX:3,TROPONINI:3 in the last 72 hours BNP: No results found for this basename: PROBNP:3 in the last 72 hours D-Dimer: No results found for this basename: DDIMER:2 in the last 72  hours CBG:  Basename 04/28/12 1002  GLUCAP 124*   Hemoglobin A1C: No results found for this basename: HGBA1C in the last 72 hours Fasting Lipid Panel: No results found for this basename: CHOL,HDL,LDLCALC,TRIG,CHOLHDL,LDLDIRECT in the last 72 hours Thyroid Function Tests: No results found for this basename: TSH,T4TOTAL,FREET4,T3FREE,THYROIDAB in the last 72 hours Anemia Panel: No results found for this basename: VITAMINB12,FOLATE,FERRITIN,TIBC,IRON,RETICCTPCT in the last 72 hours Coagulation: No results found for this basename: LABPROT:2,INR:2 in the last 72 hours Urine Drug Screen: Drugs of Abuse  No results found for this basename: labopia, cocainscrnur, labbenz, amphetmu, thcu, labbarb    Alcohol Level: No results found for this basename: ETH:2 in the last 72 hours Urinalysis:  Basename 04/27/12 0506  COLORURINE YELLOW  LABSPEC 1.015  PHURINE 8.0  GLUCOSEU NEGATIVE  HGBUR NEGATIVE  BILIRUBINUR NEGATIVE  KETONESUR NEGATIVE  PROTEINUR NEGATIVE  UROBILINOGEN 0.2  NITRITE NEGATIVE  LEUKOCYTESUR NEGATIVE    Recent Results (from the past 240 hour(s))  MRSA PCR SCREENING     Status: Normal   Collection Time   04/22/12  9:53 PM      Component Value Range Status Comment   MRSA by PCR NEGATIVE  NEGATIVE Final   CULTURE, BLOOD (ROUTINE X 2)     Status: Normal (Preliminary result)   Collection Time   04/27/12  4:45 AM      Component Value Range Status Comment   Specimen Description Blood PIC LINE   Final    Special Requests NONE [facility]   Final  Culture NO GROWTH <24 HRS   Final    Report Status PENDING   Incomplete   CULTURE, BLOOD (ROUTINE X 2)     Status: Normal (Preliminary result)   Collection Time   04/27/12  4:59 AM      Component Value Range Status Comment   Specimen Description Blood LEFT HAND   Final    Special Requests NONE 6CC   Final    Culture NO GROWTH <24 HRS   Final    Report Status PENDING   Incomplete     Studies/Results: Ct Abdomen Pelvis Wo  Contrast  04/27/2012  *RADIOLOGY REPORT*  Clinical Data: Fever.  Follow up retroperitoneal hematoma  CT ABDOMEN AND PELVIS WITHOUT CONTRAST  Technique:  Multidetector CT imaging of the abdomen and pelvis was performed following the standard protocol without intravenous contrast.  Comparison: 04/22/2012  Findings: Lung bases:  Interval development of small bilateral pleural effusions.  Heart size is normal.  No pericardial effusion.  Abdomen/pelvis:  There is no focal liver abnormality.  Gallbladder appears normal.  No biliary dilatation.  The pancreas is unremarkable.  The spleen is negative.  Bilateral low attenuation nodules are identified within the adrenal glands.  Unchanged from previous studies.  Likely small adenomas. Mild perinephric fat stranding is noted bilaterally.  This is similar to previous exam.  No hydronephrosis or hydroureter. Right iliac fossa and right pelvic side wall hematoma measures 15 x 8.3 x 7.7 cm.  Previously this measured 15.7 x 9 x 8.7 cm.  This exerts significant mass effect upon the urinary bladder, uterus and adnexal structures. Stable to improved appearance of bilateral retroperitoneal hematomas.  There has been interval decrease in bilateral rectus muscle hematomas.  The stomach and the small bowel loops appear within normal limits. Normal appearance of the colon.  Bones/Musculoskeletal:  Again identified are left inferior and superior pubic rami fractures as well as fractures involving the left pubic bone.  IMPRESSION:  1.  Stable to improved appearance of large pelvic hematoma, bilateral retroperitoneal hemorrhage, and rectus muscle hematomas. 2.  Stable pelvic fractures. 3.  Small bilateral pleural effusions are new from previous exam.   Original Report Authenticated By: Signa Kell, M.D.    US Venous Img Lower Bilateral  04/27/2012  *RADIOLOGY REPORT*  Clinical Data: History of fever.  History of bilateral leg pain.  BILATERAL LOWER EXTREMITY VENOUS DUPLEX ULTRASOUND   Technique:  Gray-scale sonography with graded compression, as well as color Doppler and duplex ultrasound, were performed to evaluate the deep venous system of both lower extremities from the level of the common femoral vein through the popliteal and proximal calf veins.  Spectral Doppler was utilized to evaluate flow at rest and with distal augmentation maneuvers.  Comparison:  None.  Findings:  Normal compressibility of bilateral common femoral, superficial femoral, and popliteal veins is demonstrated, as well as the visualized proximal calf veins.  No filling defects to suggest DVT on grayscale or color Doppler imaging.  Doppler waveforms show normal direction of venous flow, normal respiratory phasicity and response to augmentation.  IMPRESSION: No evidence of deep venous thrombosis in either leg.   Original Report Authenticated By: Onalee Hua Call    Dg Chest Port 1 View  04/27/2012  *RADIOLOGY REPORT*  Clinical Data: Fever, history heart failure, COPD, asthma, coronary artery disease  PORTABLE CHEST - 1 VIEW  Comparison: Portable exam 0620 hours compared to 04/24/2012  Findings: Right arm PICC line stable, tip projecting over cavoatrial junction. Enlargement of cardiac silhouette.  Calcified tortuous aorta. Mediastinal contours and pulmonary vascularity otherwise normal. No pulmonary infiltrate, pleural effusion or pneumothorax. Bilateral cervical ribs versus hypoplastic 1st ribs. Underlying emphysematous and bronchitic changes.  IMPRESSION: Enlargement of cardiac silhouette. Emphysematous bronchitic changes. No acute abnormalities. Improvement in aeration at right base since previous exam.   Original Report Authenticated By: Ulyses Southward, M.D.     Medications: Scheduled Meds:   . acetaminophen  650 mg Oral Once  . digoxin  0.125 mg Oral BID  . diltiazem  30 mg Oral Q6H  . furosemide  10 mg Oral BID  . levalbuterol  0.63 mg Nebulization BID  . levETIRAcetam  125 mg Oral QPM  . levETIRAcetam  250 mg Oral  Daily  . midodrine  2.5 mg Oral Daily  . pantoprazole  40 mg Oral Q M,W,F  . polyvinyl alcohol  1 drop Both Eyes QID  . [COMPLETED] potassium chloride  30 mEq Oral Daily  . potassium chloride  20 mEq Oral Daily  . simvastatin  20 mg Oral q1800  . sodium chloride  10-40 mL Intracatheter Q12H  . [COMPLETED] vancomycin  1,000 mg Intravenous Once  . vancomycin  1,000 mg Intravenous Q24H  . [DISCONTINUED] digoxin  0.125 mg Oral Daily  . [DISCONTINUED] diltiazem  120 mg Oral Daily   Continuous Infusions:   . 0.9 % NaCl with KCl 20 mEq / L 75 mL/hr at 04/28/12 0600   PRN Meds:.acetaminophen, albuterol, bisacodyl, bisacodyl, morphine injection, ondansetron (ZOFRAN) IV, ondansetron, oxyCODONE, sodium chloride, traZODone  Assessment/Plan:  Principal Problem:  *Fracture of Left superior and inferior pubic rami Active Problems:  Permanent atrial fibrillation  DIASTOLIC HEART FAILURE, CHRONIC  Chronic anticoagulation  Coronary artery disease  COPD (chronic obstructive pulmonary disease)  Fall  Macular degeneration  RLQ abdominal pain  Hypotension  Traumatic retroperitoneal hematoma  Acute post-hemorrhagic anemia  Syncope  Thrombocytopenia  Hemorrhagic shock  Fever  Plan:  1. acute pubic rami fractures and retroperitoneal hemorrhage/blood loss anemia. Patient was found to have an acute retroperitoneal hemorrhage and anterior abdominal wall hematoma. This was secondary to her pelvic fractures. Case was discussed with surgery, and supportive therapy was recommended. Her xarelto has been discontinued. She's been transfused 4 units of fresh frozen plasma and 4 units of PRBC. Her hemoglobin is currently stable. There was a mild decrease to 8.0 today. This may be dilutional affect. She does not have any evidence of active bleeding at this point. We'll repeat a hemoglobin in the morning. Hold IV fluids for now.  2. Chronic hypotension. Patient reports that her blood pressure is chronically  low. Her tachycardia, it will be challenging to titrate her rate control medications. She's currently on diltiazem, digoxin. Midrin was ordered yesterday, but was unavailable per pharmacy. We'll continue to monitor closely.   3. Atrial fibrillation rapid ventricular response. Patient does have chronic atrial fibrillation. Her diltiazem was held initially due to hypotension. Her heart rate is ranging primarily in the 110-120 range. We will titrate her diltiazem for better rate control. Xarelto has been discontinued due to bleeding.  4. Fever. Unclear etiology. Patient was started on IV vancomycin empirically. She will continue on IV antibiotics for another 2 days.  5. Chronic diastolic congestive heart failure. The patient appears to be compensated at this point. She is on Lasix.  6. Diarrhea. Likely secondary to laxative use. Laxatives are now held. We'll continue to monitor.  7. COPD. Currently stable  8. Hyperglycemia. Patient then had elevated blood glucose on basic metabolic panel  as morning, with blood sugars greater than 300. Confirmatory CBG revealed a blood sugar of 124. This was likely a lab error. We will continue to monitor blood sugars.  9. Disposition. Plan will be to transfer the patient to skilled nursing facility. With her elevated heart rate, we will try to titrate her medications to obtain better control prior to discharge. Hopeful for discharge tomorrow.  Time spent coordinating care:   LOS: 7 days   MEMON,JEHANZEB [facility] Pager: [phone] 04/28/2012, 10:36 AM

## 2012-04-28 NOTE — Progress Notes (Signed)
Pt transferred to 313 from ICU.  Pt has experienced urinary frequency today.  Increasing this afternoon.  Spoke to Dr. Kerry Hough and informed him of this.  Pt has had no c/o of burning with urination but states that she is urinating very frequently and is having incontinent episodes.  Received verbal order for urinalysis.  Orders followed.

## 2012-04-28 NOTE — Progress Notes (Signed)
Physical Therapy Treatment Patient Details Name: Deborah [name] MRN: [id number] DOB: [birth year] Today's Date: 04/28/2012 Time: 4401-0272 PT Time Calculation (min): 45 min  PT Assessment / Plan / Recommendation Comments on Treatment Session  Patient did well today during therapy session with less co/o pain to L groin area. Patient was able to perform bed mobility with good sequencing as well as sit<>stand activities. Patient made steps (3steps forward and backward), still feeling anxious due to pain. Pt during standing had accidental urination prompting to bring patient back in bed for care. Pt has good potential for further improvement and therapist will continue transfer training and begin ambulation activities tomorrow.      Follow Up Recommendations  SNF     Does the patient have the potential to tolerate intense rehabilitation     Barriers to Discharge        Equipment Recommendations       Recommendations for Other Services    Frequency 7X/week   Plan Discharge plan remains appropriate    Precautions / Restrictions Precautions Precautions: Fall Restrictions Weight Bearing Restrictions: Yes LLE Weight Bearing: Weight bearing as tolerated       Mobility  Bed Mobility Bed Mobility: Supine to Sit;Right Sidelying to Sit;Sitting - Scoot to Edge of Bed;Sit to Supine Right Sidelying to Sit: 3: Mod assist Supine to Sit: 3: Mod assist Sitting - Scoot to Edge of Bed: 4: Min guard Sit to Supine: 3: Mod assist Sit to Supine: Patient Percentage: 50% Transfers Transfers: Sit to Stand;Stand to Sit Sit to Stand: 3: Mod assist Stand to Sit: 4: Min assist Ambulation/Gait Assistive device: Rolling walker    Exercises General Exercises - Lower Extremity Ankle Circles/Pumps: AROM;Both;20 reps;Supine Quad Sets: AROM;Both;20 reps;Supine Gluteal Sets: AROM;Both;20 reps;Supine Long Arc Quad: AROM;Both;Supine;10 reps Heel Slides: AROM;20 reps;Supine Hip ABduction/ADduction:  AAROM;AROM;20 reps;Both;Supine   PT Diagnosis:    PT Problem List:   PT Treatment Interventions:     PT Goals Acute Rehab PT Goals PT Goal Formulation: With patient Potential to Achieve Goals: Good Pt will go Supine/Side to Sit: with min assist PT Goal: Supine/Side to Sit - Progress: Updated due to goal met Pt will go Sit to Supine/Side: with min assist PT Goal: Sit to Supine/Side - Progress: Updated due to goal met Pt will go Sit to Stand: with min assist PT Goal: Sit to Stand - Progress: Progressing toward goal Pt will go Stand to Sit: with supervision PT Goal: Stand to Sit - Progress: Progressing toward goal Pt will Transfer Bed to Chair/Chair to Bed: with min assist PT Transfer Goal: Bed to Chair/Chair to Bed - Progress: Progressing toward goal Pt will Ambulate: 1 - 15 feet;with mod assist;with rolling walker PT Goal: Ambulate - Progress: Progressing toward goal  Visit Information  Last PT Received On: 04/28/12    Subjective Data  Subjective: I would like to work more with therapy.  Patient Stated Goal: be able to return home    Cognition  Overall Cognitive Status: Appears within functional limits for tasks assessed/performed Arousal/Alertness: Awake/alert Orientation Level: Appears intact for tasks assessed Behavior During Session: [facility] for tasks performed    Balance     End of Session PT - End of Session Equipment Utilized During Treatment: Gait belt Activity Tolerance: Patient tolerated treatment well Patient left: in bed;with call bell/phone within reach;with family/visitor present Nurse Communication: Mobility status   GP     Marielle Mantione, Larna Daughters 04/28/2012, 5:24 PM

## 2012-04-29 ENCOUNTER — Inpatient Hospital Stay (HOSPITAL_COMMUNITY): Payer: Medicare Other

## 2012-04-29 LAB — CBC
HCT: 27.3 % — ABNORMAL LOW (ref 36.0–46.0)
Hemoglobin: 8.9 g/dL — ABNORMAL LOW (ref 12.0–15.0)
Hemoglobin: 10.4 g/dL — ABNORMAL LOW (ref 12.0–15.0)
MCV: 89.8 fL (ref 78.0–100.0)
MCV: 89.7 fL (ref 78.0–100.0)
Platelets: 313 10*3/uL (ref 150–400)
RBC: 3.04 MIL/uL — ABNORMAL LOW (ref 3.87–5.11)
RBC: 3.51 MIL/uL — ABNORMAL LOW (ref 3.87–5.11)
RDW: 15.1 % (ref 11.5–15.5)
WBC: 7.8 10*3/uL (ref 4.0–10.5)
WBC: 9.6 10*3/uL (ref 4.0–10.5)

## 2012-04-29 LAB — BASIC METABOLIC PANEL
BUN: 12 mg/dL (ref 6–23)
CO2: 27 mEq/L (ref 19–32)
Chloride: 97 mEq/L (ref 96–112)
Creatinine, Ser: 0.54 mg/dL (ref 0.50–1.10)
GFR calc Af Amer: >90 mL/min (ref >90)
Potassium: 3.7 mEq/L (ref 3.5–5.1)

## 2012-04-29 MED ORDER — AMIODARONE HCL IN DEXTROSE 360-4.14 MG/200ML-% IV SOLN
30.0000 mg/h | INTRAVENOUS | Status: DC
  Administered 2012-04-29 – 2012-04-30 (×2): 30 mg/h via INTRAVENOUS
  Filled 2012-04-29 (×3): qty 200

## 2012-04-29 MED ORDER — AMIODARONE HCL IN DEXTROSE 360-4.14 MG/200ML-% IV SOLN
60.0000 mg/h | INTRAVENOUS | Status: AC
  Administered 2012-04-29 (×2): 60 mg/h via INTRAVENOUS
  Filled 2012-04-29: qty 200

## 2012-04-29 MED ORDER — AMIODARONE LOAD VIA INFUSION
150.0000 mg | Freq: Once | INTRAVENOUS | Status: DC
  Administered 2012-04-29: 150 mg via INTRAVENOUS
  Filled 2012-04-29: qty 83.34

## 2012-04-29 NOTE — Progress Notes (Addendum)
PT RECEIVED TO ROOM ICU 10. AMIODARONE IV BOLUS OF 150MG  GIVEN, THEN DRIP STARTED AT 60MG /HR IMMEDIATELY FOR AF W/ RVR.

## 2012-04-29 NOTE — Progress Notes (Signed)
Physical Therapy Treatment Patient Details Name: TAELYN BROECKER MRN: [id number] DOB: 01-30-1926 Today's Date: 04/29/2012 Time: 4098-1191 PT Time Calculation (min): 45 min  PT Assessment / Plan / Recommendation Comments on Treatment Session  Patient was able to perform ambulation activities from bed to toilet using a RW and transfer activities to/from bed to reclining chair. Pt gradually progressing with no complaints of pain at this time. Nursing assistant and nurse made aware of transfer status.      Follow Up Recommendations  SNF     Does the patient have the potential to tolerate intense rehabilitation     Barriers to Discharge        Equipment Recommendations       Recommendations for Other Services    Frequency 7X/week   Plan Discharge plan remains appropriate    Precautions / Restrictions Precautions Precautions: Fall Restrictions Weight Bearing Restrictions: Yes LLE Weight Bearing: Weight bearing as tolerated       Mobility  Bed Mobility Bed Mobility: Supine to Sit;Sitting - Scoot to Edge of Bed Right Sidelying to Sit: 4: Min assist Sitting - Scoot to [place] of Bed: 3: Mod assist Transfers Transfers: Sit to Stand;Stand to [employer] Transfers Sit to Stand: 3: Mod assist Stand to Sit: 4: Min Designer, television/film set Transfers: 3: Mod assist Ambulation/Gait Ambulation/Gait Assistance: 2: Max assist Ambulation Distance (Feet): 10 Feet (Max-Mod A using a RW ) Assistive device: Rolling walker Gait velocity: slowed  General Gait Details: antalgic     Exercises General Exercises - Lower Extremity Ankle Circles/Pumps: AROM;Both;20 reps;Supine;Seated Quad Sets: AROM;Both;20 reps;Supine Short Arc Quad: AAROM;Right;20 reps;Supine Long Arc Quad: AROM;Left;20 reps;Seated Heel Slides: AROM;Both;20 reps;Seated Hip ABduction/ADduction: AROM;AAROM;Both;20 reps;Supine Straight Leg Raises: AAROM;Right;10 reps;Supine Toe Raises: AROM;Both;20 reps;Seated Heel Raises:  AROM;Both;20 reps;Seated   PT Diagnosis:    PT Problem List:   PT Treatment Interventions:     PT Goals Acute Rehab PT Goals PT Goal Formulation: With patient Time For Goal Achievement: 05/06/12 Potential to Achieve Goals: Good Pt will go Supine/Side to Sit: with min assist PT Goal: Supine/Side to Sit - Progress: Progressing toward goal Pt will go Sit to Stand: with mod assist PT Goal: Sit to Stand - Progress: Progressing toward goal Pt will go Stand to Sit: with mod assist PT Goal: Stand to Sit - Progress: Progressing toward goal Pt will Transfer Bed to Chair/Chair to Bed: with mod assist PT Transfer Goal: Bed to Chair/Chair to Bed - Progress: Progressing toward goal Pt will Ambulate: 1 - 15 feet;with mod assist PT Goal: Ambulate - Progress: Progressing toward goal  Visit Information  Last PT Received On: 04/29/12    Subjective Data  Subjective: I need to go to the toilet Patient Stated Goal: to be able to walk and return home    Cognition  Overall Cognitive Status: Appears within functional limits for tasks assessed/performed Arousal/Alertness: Awake/alert Orientation Level: Appears intact for tasks assessed Behavior During Session: [facility] for tasks performed    Balance     End of Session PT - End of Session Equipment Utilized During Treatment: Gait belt Activity Tolerance: Patient tolerated treatment well Patient left: in chair;with call bell/phone within reach;with bed alarm set Nurse Communication: Mobility status;Precautions;Weight bearing status   GP     Roark Rufo, Larna Daughters 04/29/2012, 12:14 PM

## 2012-04-29 NOTE — Progress Notes (Signed)
PT ASSISTED X2 UP TO BSC TO VOID. PT HAD VERY DIFFICULT TIME TRANSFERING FROM BED TO BSC AND BACK SHE WAS VERY ANXIOUS THAT SHE WOULD HAVE PAIN OR POSSIBLY FALL AGAIN.

## 2012-04-29 NOTE — Progress Notes (Signed)
AMIODARONE DRIP DECREASED TO 30MG /HR PER PROTOCOL AND MD ORDERS.

## 2012-04-29 NOTE — Progress Notes (Signed)
Pt's daughter notified that pt will be transferred to 210 ICU stepdown.  Pt's daughter verbalized understanding.  Report called to [place], Charity fundraiser.  Pt transported via bed by nurse and NT via bed to stepdown.

## 2012-04-29 NOTE — Progress Notes (Signed)
Dr. Kerry Hough notified that since patient is receiving IV Amiodarone, patient will be moved to ICU stepdown.

## 2012-04-29 NOTE — Progress Notes (Signed)
[facility]             Progress Note   Subjective: Patient denies any chest pain, lightheadedness, shortness of breath.  Pain is reasonably controlled.  She has been tachycardic. Had 6-7 bowel movements yesterday, one today.  Objective: Vital signs in last 24 hours: Temp:  [98.2 F (36.8 C)-98.9 F (37.2 C)] 98.9 F (37.2 C) (12/08 1613) Pulse Rate:  [27-150] 134  (12/08 1830) Resp:  [17-34] 17  (12/08 1830) BP: (73-103)/(46-87) 86/54 mmHg (12/08 1830) SpO2:  [93 %-100 %] 96 % (12/08 1830) Weight:  [64.1 kg (141 lb 5 oz)] 64.1 kg (141 lb 5 oz) (12/08 0428) Weight change:  Last BM Date: 04/29/12  Intake/Output from previous day: 12/07 0701 - 12/08 0700 In: 780 [P.O.:540; I.V.:40; IV Piggyback:200] Out: 850 [Urine:850] Total I/O In: 800 [P.O.:600; IV Piggyback:200] Out: -    Physical Exam: General: Alert, awake, oriented x3, in no acute distress. HEENT: No bruits, no goiter. Heart: irregular Lungs: Clear to auscultation bilaterally. Abdomen: Soft, nontender, nondistended, positive bowel sounds. Extremities: No clubbing cyanosis or edema with positive pedal pulses. Neuro: Grossly intact, nonfocal.    Lab Results: Basic Metabolic Panel:  Basename 04/29/12 0643 04/28/12 0450  NA 131* 135  K 3.7 5.0  CL 97 105  CO2 27 24  GLUCOSE 88 386*  BUN 12 12  CREATININE 0.54 0.53  CALCIUM 8.7 7.8*  MG -- --  PHOS -- --   Liver Function Tests: No results found for this basename: AST:2,ALT:2,ALKPHOS:2,BILITOT:2,PROT:2,ALBUMIN:2 in the last 72 hours No results found for this basename: LIPASE:2,AMYLASE:2 in the last 72 hours No results found for this basename: AMMONIA:2 in the last 72 hours CBC:  Basename 04/29/12 1700 04/29/12 0643  WBC 9.6 7.8  NEUTROABS -- --  HGB 10.4* 8.9*  HCT 31.5* 27.3*  MCV 89.7 89.8  PLT 313 250   Cardiac Enzymes: No results found for this basename: CKTOTAL:3,CKMB:3,CKMBINDEX:3,TROPONINI:3 in the last 72 hours BNP: No  results found for this basename: PROBNP:3 in the last 72 hours D-Dimer: No results found for this basename: DDIMER:2 in the last 72 hours CBG:  Basename 04/28/12 1002  GLUCAP 124*   Hemoglobin A1C: No results found for this basename: HGBA1C in the last 72 hours Fasting Lipid Panel: No results found for this basename: CHOL,HDL,LDLCALC,TRIG,CHOLHDL,LDLDIRECT in the last 72 hours Thyroid Function Tests: No results found for this basename: TSH,T4TOTAL,FREET4,T3FREE,THYROIDAB in the last 72 hours Anemia Panel: No results found for this basename: VITAMINB12,FOLATE,FERRITIN,TIBC,IRON,RETICCTPCT in the last 72 hours Coagulation: No results found for this basename: LABPROT:2,INR:2 in the last 72 hours Urine Drug Screen: Drugs of Abuse  No results found for this basename: labopia,  cocainscrnur,  labbenz,  amphetmu,  thcu,  labbarb    Alcohol Level: No results found for this basename: ETH:2 in the last 72 hours Urinalysis:  Basename 04/28/12 1830 04/27/12 0506  COLORURINE YELLOW YELLOW  LABSPEC 1.010 1.015  PHURINE 6.5 8.0  GLUCOSEU NEGATIVE NEGATIVE  HGBUR TRACE* NEGATIVE  BILIRUBINUR NEGATIVE NEGATIVE  KETONESUR NEGATIVE NEGATIVE  PROTEINUR NEGATIVE NEGATIVE  UROBILINOGEN 0.2 0.2  NITRITE NEGATIVE NEGATIVE  LEUKOCYTESUR NEGATIVE NEGATIVE    Recent Results (from the past 240 hour(s))  MRSA PCR SCREENING     Status: Normal   Collection Time   04/22/12  9:53 PM      Component Value Range Status Comment   MRSA by PCR NEGATIVE  NEGATIVE Final   CULTURE, BLOOD (ROUTINE X 2)     Status: Normal (  Preliminary result)   Collection Time   04/27/12  4:45 AM      Component Value Range Status Comment   Specimen Description Blood PIC LINE   Final    Special Requests NONE 6CC   Final    Culture NO GROWTH 2 DAYS   Final    Report Status PENDING   Incomplete   CULTURE, BLOOD (ROUTINE X 2)     Status: Normal (Preliminary result)   Collection Time   04/27/12  4:59 AM      Component Value  Range Status Comment   Specimen Description Blood LEFT HAND   Final    Special Requests NONE 6CC   Final    Culture NO GROWTH 2 DAYS   Final    Report Status PENDING   Incomplete     Studies/Results: No results found.  Medications: Scheduled Meds:    . acetaminophen  650 mg Oral Once  . [COMPLETED] amiodarone  150 mg Intravenous Once  . digoxin  0.125 mg Oral Daily  . furosemide  10 mg Oral BID  . levETIRAcetam  125 mg Oral QPM  . levETIRAcetam  250 mg Oral Daily  . midodrine  2.5 mg Oral Daily  . pantoprazole  40 mg Oral Q M,W,F  . polyvinyl alcohol  1 drop Both Eyes QID  . potassium chloride  20 mEq Oral Daily  . simvastatin  20 mg Oral q1800  . sodium chloride  10-40 mL Intracatheter Q12H  . vancomycin  1,000 mg Intravenous Q24H  . [DISCONTINUED] diltiazem  60 mg Oral Q6H  . [DISCONTINUED] levalbuterol  0.63 mg Nebulization BID   Continuous Infusions:    . [EXPIRED] amiodarone (NEXTERONE PREMIX) 360 mg/200 mL dextrose 60 mg/hr (04/29/12 1640)   And  . amiodarone (NEXTERONE PREMIX) 360 mg/200 mL dextrose 30 mg/hr (04/29/12 1651)   PRN Meds:.acetaminophen, albuterol, bisacodyl, bisacodyl, morphine injection, ondansetron (ZOFRAN) IV, ondansetron, oxyCODONE, sodium chloride, traZODone  Assessment/Plan:  Principal Problem:  *Fracture of Left superior and inferior pubic rami Active Problems:  Permanent atrial fibrillation  DIASTOLIC HEART FAILURE, CHRONIC  Chronic anticoagulation  Coronary artery disease  COPD (chronic obstructive pulmonary disease)  Fall  Macular degeneration  RLQ abdominal pain  Hypotension  Traumatic retroperitoneal hematoma  Acute post-hemorrhagic anemia  Syncope  Thrombocytopenia  Hemorrhagic shock  Fever  Plan:  1. acute pubic rami fractures and retroperitoneal hemorrhage/blood loss anemia. Patient was found to have an acute retroperitoneal hemorrhage and anterior abdominal wall hematoma. This was secondary to her pelvic fractures.  Case was discussed with general surgery, and supportive therapy was recommended. Her xarelto has been discontinued. She was transfused 4 units of fresh frozen plasma and 4 units of PRBC. Her hemoglobin is currently improving.. She does not have any evidence of active bleeding at this point.   2. Chronic hypotension. Patient reports that her blood pressure is chronically low. With her tachycardia, it will be challenging to titrate her rate control medications. She's currently on diltiazem, digoxin. Midodrine was ordered to help with blood pressure, but was unavailable per pharmacy. We'll continue to monitor closely.   3. Atrial fibrillation rapid ventricular response. Patient does have chronic atrial fibrillation. We'll try to titrate her diltiazem for better rate control. Unfortunately this has resulted in further hypotension with her blood pressure going down to the 70s. I discontinued diltiazem at this time in favor of IV amiodarone. Patient has been transferred to the step down unit for IV therapy. We will request a cardiology consultation  to help assist in adjusting her medications for atrial fibrillation. She's not a candidate for anticoagulation at this time.  4. Fever. Unclear etiology. Patient was started on IV vancomycin empirically. She will continue on IV antibiotics for another 2 days. She does not have any fever today.  5. Chronic diastolic congestive heart failure. The patient appears to be compensated at this point. She is on very small dose of oral Lasix. We'll repeat chest x-ray.  6. Diarrhea. Likely secondary to laxative use. Laxatives are now held in diarrhea has resolved. We'll continue to monitor.  7. COPD. Currently stable  8. Hyperglycemia. Patient had elevated blood glucose on basic metabolic panel, with blood sugars greater than 300. Confirmatory CBG revealed a blood sugar of 124. This was likely a lab error. Since then her blood sugars have been normal.  9. Disposition.  Plans will be to eventually transfer the patient to skilled nursing facility for further care. Her heart rate and blood pressure will need to come under better control..  Time spent coordinating care:   LOS: 8 days   MEMON,JEHANZEB [facility] Pager: [phone] 04/29/2012, 6:34 PM

## 2012-04-29 NOTE — Progress Notes (Signed)
Pt's BP 74/52.  Pt up in chair after PT.  Heart rate on monitor 122.  Pt states she feels sleepy.  Dr. Kerry Hough notified.  Dr. Kerry Hough returned page and ordered to hold Midodrine and that he would discontinue her Cardizem and order Amiodarone.

## 2012-04-30 ENCOUNTER — Encounter (HOSPITAL_COMMUNITY): Payer: Self-pay | Admitting: Cardiology

## 2012-04-30 LAB — CBC
HCT: 30.8 % — ABNORMAL LOW (ref 36.0–46.0)
MCH: 29.4 pg (ref 26.0–34.0)
MCV: 90.6 fL (ref 78.0–100.0)
RBC: 3.4 MIL/uL — ABNORMAL LOW (ref 3.87–5.11)
WBC: 8.2 10*3/uL (ref 4.0–10.5)

## 2012-04-30 LAB — BASIC METABOLIC PANEL
CO2: 28 mEq/L (ref 19–32)
Chloride: 97 mEq/L (ref 96–112)
Glucose, Bld: 96 mg/dL (ref 70–99)
Sodium: 132 mEq/L — ABNORMAL LOW (ref 135–145)

## 2012-04-30 MED ORDER — FLUDROCORTISONE ACETATE 0.1 MG PO TABS
0.1000 mg | ORAL_TABLET | Freq: Every day | ORAL | Status: DC
  Administered 2012-04-30 – 2012-05-02 (×3): 0.1 mg via ORAL
  Filled 2012-04-30 (×5): qty 1

## 2012-04-30 MED ORDER — DILTIAZEM HCL 30 MG PO TABS
30.0000 mg | ORAL_TABLET | Freq: Four times a day (QID) | ORAL | Status: DC
  Administered 2012-04-30 – 2012-05-01 (×4): 30 mg via ORAL
  Filled 2012-04-30 (×4): qty 1

## 2012-04-30 NOTE — Progress Notes (Signed)
Pt begins to become very confused and agitated when evening come.

## 2012-04-30 NOTE — Progress Notes (Addendum)
[facility]             Progress Note   Subjective: Patient denies any chest pain, shortness of breath.  She has no new complaints today  Objective: Vital signs in last 24 hours: Temp:  [97.3 F (36.3 C)-99.3 F (37.4 C)] 98.7 F (37.1 C) (12/09 0835) Pulse Rate:  [27-150] 94  (12/09 0845) Resp:  [12-34] 21  (12/09 0845) BP: (65-100)/(46-76) 65/49 mmHg (12/09 0845) SpO2:  [83 %-99 %] 83 % (12/09 0845) Weight change:  Last BM Date: 04/30/12  Intake/Output from previous day: 12/08 0701 - 12/09 0700 In: 2116.3 [P.O.:950; I.V.:966.3; IV Piggyback:200] Out: 525 [Urine:525] Total I/O In: 256.7 [P.O.:240; I.V.:16.7] Out: 300 [Urine:300]   Physical Exam: General: Alert, awake, oriented x3, in no acute distress. HEENT: No bruits, no goiter. Heart: irregular Lungs: Clear to auscultation bilaterally. Abdomen: Soft, nontender, nondistended, positive bowel sounds. Extremities: No clubbing cyanosis or edema with positive pedal pulses. Neuro: Grossly intact, nonfocal.    Lab Results: Basic Metabolic Panel:  Basename 04/30/12 0416 04/29/12 0643  NA 132* 131*  K 3.6 3.7  CL 97 97  CO2 28 27  GLUCOSE 96 88  BUN 14 12  CREATININE 0.58 0.54  CALCIUM 9.0 8.7  MG -- --  PHOS -- --   Liver Function Tests: No results found for this basename: AST:2,ALT:2,ALKPHOS:2,BILITOT:2,PROT:2,ALBUMIN:2 in the last 72 hours No results found for this basename: LIPASE:2,AMYLASE:2 in the last 72 hours No results found for this basename: AMMONIA:2 in the last 72 hours CBC:  Basename 04/30/12 0417 04/29/12 1700  WBC 8.2 9.6  NEUTROABS -- --  HGB 10.0* 10.4*  HCT 30.8* 31.5*  MCV 90.6 89.7  PLT 331 313   Cardiac Enzymes: No results found for this basename: CKTOTAL:3,CKMB:3,CKMBINDEX:3,TROPONINI:3 in the last 72 hours BNP: No results found for this basename: PROBNP:3 in the last 72 hours D-Dimer: No results found for this basename: DDIMER:2 in the last 72  hours CBG:  Basename 04/28/12 1002  GLUCAP 124*   Hemoglobin A1C: No results found for this basename: HGBA1C in the last 72 hours Fasting Lipid Panel: No results found for this basename: CHOL,HDL,LDLCALC,TRIG,CHOLHDL,LDLDIRECT in the last 72 hours Thyroid Function Tests: No results found for this basename: TSH,T4TOTAL,FREET4,T3FREE,THYROIDAB in the last 72 hours Anemia Panel: No results found for this basename: VITAMINB12,FOLATE,FERRITIN,TIBC,IRON,RETICCTPCT in the last 72 hours Coagulation: No results found for this basename: LABPROT:2,INR:2 in the last 72 hours Urine Drug Screen: Drugs of Abuse  No results found for this basename: labopia,  cocainscrnur,  labbenz,  amphetmu,  thcu,  labbarb    Alcohol Level: No results found for this basename: ETH:2 in the last 72 hours Urinalysis:  Basename 04/28/12 1830  COLORURINE YELLOW  LABSPEC 1.010  PHURINE 6.5  GLUCOSEU NEGATIVE  HGBUR TRACE*  BILIRUBINUR NEGATIVE  KETONESUR NEGATIVE  PROTEINUR NEGATIVE  UROBILINOGEN 0.2  NITRITE NEGATIVE  LEUKOCYTESUR NEGATIVE    Recent Results (from the past 240 hour(s))  MRSA PCR SCREENING     Status: Normal   Collection Time   04/22/12  9:53 PM      Component Value Range Status Comment   MRSA by PCR NEGATIVE  NEGATIVE Final   CULTURE, BLOOD (ROUTINE X 2)     Status: Normal (Preliminary result)   Collection Time   04/27/12  4:45 AM      Component Value Range Status Comment   Specimen Description Blood PIC LINE   Final    Special Requests NONE 6CC  Final    Culture NO GROWTH 3 DAYS   Final    Report Status PENDING   Incomplete   CULTURE, BLOOD (ROUTINE X 2)     Status: Normal (Preliminary result)   Collection Time   04/27/12  4:59 AM      Component Value Range Status Comment   Specimen Description Blood LEFT HAND   Final    Special Requests NONE [facility]   Final    Culture NO GROWTH 3 DAYS   Final    Report Status PENDING   Incomplete     Studies/Results: Dg Chest Port 1  View  04/29/2012  *RADIOLOGY REPORT*  Clinical Data: Short of breath.  Weakness.  Cough.  PORTABLE CHEST - 1 VIEW  Comparison: 04/27/2012.  Findings: Right upper extremity PICC is unchanged the tip at the cavoatrial junction.  No airspace disease.  No effusion. Cardiopericardial silhouette is unchanged, upper limits of normal for projection.  Extensive calcification of the tracheobronchial tree. Monitoring leads are projected over the chest.  IMPRESSION: No interval change or acute cardiopulmonary disease.   Original Report Authenticated By: Andreas Newport, M.D.     Medications: Scheduled Meds:    . acetaminophen  650 mg Oral Once  . digoxin  0.125 mg Oral Daily  . diltiazem  30 mg Oral Q6H  . fludrocortisone  0.1 mg Oral Daily  . levETIRAcetam  125 mg Oral QPM  . levETIRAcetam  250 mg Oral Daily  . pantoprazole  40 mg Oral Q M,W,F  . polyvinyl alcohol  1 drop Both Eyes QID  . potassium chloride  20 mEq Oral Daily  . simvastatin  20 mg Oral q1800  . sodium chloride  10-40 mL Intracatheter Q12H  . vancomycin  1,000 mg Intravenous Q24H  . [COMPLETED] amiodarone  150 mg Intravenous Once  . [DISCONTINUED] diltiazem  60 mg Oral Q6H  . [DISCONTINUED] furosemide  10 mg Oral BID  . [DISCONTINUED] midodrine  2.5 mg Oral Daily   Continuous Infusions:    . [EXPIRED] amiodarone (NEXTERONE PREMIX) 360 mg/200 mL dextrose 60 mg/hr (04/29/12 1640)  . [DISCONTINUED] amiodarone (NEXTERONE PREMIX) 360 mg/200 mL dextrose 30 mg/hr (04/30/12 0800)   PRN Meds:.acetaminophen, albuterol, bisacodyl, bisacodyl, morphine injection, ondansetron (ZOFRAN) IV, ondansetron, oxyCODONE, sodium chloride, traZODone  Assessment/Plan:  Principal Problem:  *Fracture of Left superior and inferior pubic rami Active Problems:  Permanent atrial fibrillation  DIASTOLIC HEART FAILURE, CHRONIC  Chronic anticoagulation  Coronary artery disease  COPD (chronic obstructive pulmonary disease)  Fall  Macular degeneration   RLQ abdominal pain  Hypotension  Traumatic retroperitoneal hematoma  Acute post-hemorrhagic anemia  Syncope  Thrombocytopenia  Hemorrhagic shock  Fever  Plan:  1. acute pubic rami fractures and retroperitoneal hemorrhage/blood loss anemia. Patient was found to have an acute retroperitoneal hemorrhage and anterior abdominal wall hematoma. This was secondary to her pelvic fractures. Case was discussed with general surgery, and supportive therapy was recommended. Her xarelto has been discontinued. She was transfused 4 units of fresh frozen plasma and 4 units of PRBC. Her hemoglobin is currently improving.. She does not have any evidence of active bleeding at this point.   2. Chronic hypotension. Patient reports that her blood pressure is chronically low. With her tachycardia, it will be challenging to titrate her rate control medications. Midodrine was ordered to help with blood pressure, discontinued by cardiology in favor of florinef. We'll continue to monitor closely.   3. Atrial fibrillation rapid ventricular response. Patient does have chronic atrial fibrillation.  She was started on amiodarone to help with rate control.  Appreciate cardiology assistance. Amiodarone has been discontinued since patient has not tolerated it in the past.  She will be restarted on low dose cardizem which will be titrated up as tolerated.   4. Fever. Unclear etiology. Patient was started on IV vancomycin empirically. She will continue on IV antibiotics for another 2 days. She does not have any fever today.  5. Chronic diastolic congestive heart failure. The patient appears to be compensated at this point. She is on very small dose of oral Lasix which will be held for now in light of her hypotension.   6. Diarrhea. Likely secondary to laxative use. Laxatives are now held in diarrhea has resolved. We'll continue to monitor.  7. COPD. Currently stable  8. Hyperglycemia. Patient had elevated blood glucose on basic  metabolic panel, with blood sugars greater than 300. Confirmatory CBG revealed a blood sugar of 124. This was likely a lab error. Since then her blood sugars have been normal.  9. Disposition. Plans will be to eventually transfer the patient to skilled nursing facility for further care. Her heart rate and blood pressure will need to come under better control..  Time spent coordinating care:   LOS: 9 days   MEMON,JEHANZEB [facility] Pager: [phone] 04/30/2012, 10:58 AM

## 2012-04-30 NOTE — Consult Note (Deleted)
CARDIOLOGY CONSULT NOTE  Erroenous note

## 2012-04-30 NOTE — Consult Note (Signed)
Primary cardiologist: Previously Dr. Lewayne Bunting  Clinical Summary Deborah [name] is an 78 y.o.female presently admitted to the hospital with acute pubic rami fractures and retroperitoneal hemorrhage associated with blood loss anemia, being managed with supportive therapy, anticoagulants discontinued, and status post packed red cell transfusion. She states that she tripped on a cord while carrying a CD player, had no sudden dizziness or frank syncope. Her cardiac history is outlined below, and we have been consulted to assist with management. She has had relatively chronic hypotension, exacerbated by her current illness, and has had elevated heart rate in permanent atrial fibrillation.  She reports intermittent pain from her fracture, still limited ambulation. She has had no chest pain or palpitations.  Last office visit with Dr. Andee Lineman was back in March of this year.   Allergies  Allergen Reactions  . Omnipaque (Iohexol) Shortness Of Breath and Other (See Comments)    Short of breath with chest tightness after IV injection in CT, pt was fine when she left department but developed symptoms in parking lot and went to emergency department.  . Carbamazepine     REACTION: toxemia  . Tramadol Other (See Comments)    Unknown     Medications    . acetaminophen  650 mg Oral Once  . [COMPLETED] amiodarone  150 mg Intravenous Once  . digoxin  0.125 mg Oral Daily  . levETIRAcetam  125 mg Oral QPM  . levETIRAcetam  250 mg Oral Daily  . midodrine  2.5 mg Oral Daily  . pantoprazole  40 mg Oral Q M,W,F  . polyvinyl alcohol  1 drop Both Eyes QID  . potassium chloride  20 mEq Oral Daily  . simvastatin  20 mg Oral q1800  . sodium chloride  10-40 mL Intracatheter Q12H  . vancomycin  1,000 mg Intravenous Q24H  . [DISCONTINUED] diltiazem  60 mg Oral Q6H  . [DISCONTINUED] furosemide  10 mg Oral BID    Past Medical History  Diagnosis Date  . Chronic diastolic heart failure   . Atrial fibrillation       Permanent  . COPD (chronic obstructive pulmonary disease)   . Drug intolerance     Flecacinide and Amiodarone   . Asthma   . Umbilical hernia   . Coronary atherosclerosis of native coronary artery     BMS circumflex 2009  . Chronic anticoagulation     Dabigatran  . Seizures     Remote history of over 20 years ago    Past Surgical History  Procedure Date  . Radiofrequency catheter ablation     Failed  . Right carpel tunnel release   . Cataract extraction   . Tonsillectomy     Family History  Problem Relation Age of Onset  . Coronary artery disease Mother     Social History Deborah [name] reports that she quit smoking about 21 years ago. Her smoking use included Cigarettes. She has a 32 pack-year smoking history. She has never used smokeless tobacco. Deborah [name] reports that she does not drink alcohol.  Review of Systems As outlined above. She reports no typical palpitations or exertional chest pain. Indicates compliance with her medications.   Physical Examination Blood pressure 65/49, pulse 94, temperature 98.7 F (37.1 C), temperature source Oral, resp. rate 21, height 5\' 4"  (1.626 m), weight 141 lb 5 oz (64.1 kg), SpO2 83.00%.  Patient in no acute distress. HEENT: Conjunctiva and lids normal, oropharynx clear. Neck: Supple, no elevated JVP or carotid bruits, no thyromegaly.  Lungs: Clear to auscultation, nonlabored breathing at rest. Cardiac: Irregularly irregular, no S3 or significant systolic murmur, no pericardial rub. Abdomen: Soft, nontender, bowel sounds present, no guarding or rebound. Extremities: No pitting edema, distal pulses 2+. Skin: Warm and dry. Scattered ecchymoses. Musculoskeletal: No kyphosis. Neuropsychiatric: Alert and oriented x3, affect grossly appropriate.   Lab Results Basic Metabolic Panel:  Lab 04/30/12 1610 04/29/12 0643 04/28/12 0450 04/27/12 0306 04/26/12 0450  NA 132* 131* 135 136 136  K 3.6 3.7 -- -- --  CL 97 97 105 103 105   CO2 28 27 24 28 25   GLUCOSE 96 88 386* 102* 95  BUN 14 12 12 11 9   CREATININE 0.58 0.54 0.53 0.54 0.49*  CALCIUM 9.0 8.7 7.8* 8.3* 7.5*  MG -- -- -- -- --  PHOS -- -- -- -- --   Liver Function Tests:  Lab 04/24/12 0513  AST 15  ALT 9  ALKPHOS 53  BILITOT 0.6  PROT 5.4*  ALBUMIN 2.9*   CBC:  Lab 04/30/12 0417 04/29/12 1700 04/29/12 0643 04/28/12 1700 04/28/12 0450 04/23/12 1034  WBC 8.2 9.6 7.8 10.6* 8.1 --  NEUTROABS -- -- -- -- -- 8.7*  HGB 10.0* 10.4* 8.9* 9.3* 8.0* --  HCT 30.8* 31.5* 27.3* 27.8* 24.8* --  MCV 90.6 89.7 89.8 90.0 90.8 --  PLT 331 313 250 253 199 --    Echocardiogram from 12/12 showed LVEF 60% with no regional wall motion abnormalities, mildly calcified aortic valve, MAC, moderate left atrial enlargement, moderately dilated right atrium.   Impression  1. Permanent atrial fibrillation, elevated heart rate response in the setting of acute illness. Heart rate control regimen has been somewhat limited by concurrent hypotension. At the present time she is not anticoagulated with recent fall and pubic rami fractures and retroperitoneal hemorrhage.  2. Known history of CAD status post previous BMS to the circumflex, no active angina. Echocardiogram shows normal LV function.  3. Hypotension in the setting of acute illness on baseline of relative chronicity. She had tolerated to long-acting Cardizem as an outpatient as well as digoxin.  Recommendations  Would discontinue amiodarone with chart review finding previous intolerance. We will try Florinef instead of Midodrine, and cautiously initiate low-dose short-acting Cardizem to avoid excessive rapid ventricular response, realizing that she may have some relatively increased heart rate at rest in the setting of her acute illness. Obviously agree with stopping anticoagulation at this time.  Jonelle Sidle, M.D., F.A.C.C.

## 2012-04-30 NOTE — Progress Notes (Signed)
Amiodarone drip stopped as ordered.

## 2012-04-30 NOTE — Progress Notes (Signed)
ANTIBIOTIC CONSULT NOTE   Pharmacy Consult for Vancomycin Indication: febrile illness in ICU (empiric Rx)  Allergies  Allergen Reactions  . Omnipaque (Iohexol) Shortness Of Breath and Other (See Comments)    Short of breath with chest tightness after IV injection in CT, pt was fine when she left department but developed symptoms in parking lot and went to emergency department.  . Carbamazepine     REACTION: toxemia  . Tramadol Other (See Comments)    Unknown    Patient Measurements: Height: 5\' 4"  (162.6 cm) Weight: 141 lb 5 oz (64.1 kg) IBW/kg (Calculated) : 54.7   Vital Signs: Temp: 98.7 F (37.1 C) (12/09 0835) Temp src: Oral (12/09 0835) BP: 88/57 mmHg (12/09 1100) Pulse Rate: 110  (12/09 1100) Intake/Output from previous day: 12/08 0701 - 12/09 0700 In: 2116.3 [P.O.:950; I.V.:966.3; IV Piggyback:200] Out: 525 [Urine:525] Intake/Output from this shift: Total I/O In: 256.7 [P.O.:240; I.V.:16.7] Out: 300 [Urine:300]  Labs:  [facility] 04/30/12 0417 04/30/12 0416 04/29/12 1700 04/29/12 0643 04/28/12 0450  WBC 8.2 -- 9.6 7.8 --  HGB 10.0* -- 10.4* 8.9* --  PLT 331 -- 313 250 --  LABCREA -- -- -- -- --  CREATININE -- 0.58 -- 0.54 0.53   Estimated Creatinine Clearance: 46 ml/min (by C-G formula based on Cr of 0.58). No results found for this basename: VANCOTROUGH:2,VANCOPEAK:2,VANCORANDOM:2,GENTTROUGH:2,GENTPEAK:2,GENTRANDOM:2,TOBRATROUGH:2,TOBRAPEAK:2,TOBRARND:2,AMIKACINPEAK:2,AMIKACINTROU:2,AMIKACIN:2, in the last 72 hours   Microbiology: Recent Results (from the past 720 hour(s))  MRSA PCR SCREENING     Status: Normal   Collection Time   04/22/12  9:53 PM      Component Value Range Status Comment   MRSA by PCR NEGATIVE  NEGATIVE Final   CULTURE, BLOOD (ROUTINE X 2)     Status: Normal (Preliminary result)   Collection Time   04/27/12  4:45 AM      Component Value Range Status Comment   Specimen Description Blood PIC LINE   Final    Special Requests NONE 6CC    Final    Culture NO GROWTH 3 DAYS   Final    Report Status PENDING   Incomplete   CULTURE, BLOOD (ROUTINE X 2)     Status: Normal (Preliminary result)   Collection Time   04/27/12  4:59 AM      Component Value Range Status Comment   Specimen Description Blood LEFT HAND   Final    Special Requests NONE 6CC   Final    Culture NO GROWTH 3 DAYS   Final    Report Status PENDING   Incomplete     Medical History: Past Medical History  Diagnosis Date  . Chronic diastolic heart failure   . Atrial fibrillation     Permanent  . COPD (chronic obstructive pulmonary disease)   . Drug intolerance     Flecacinide and Amiodarone   . Asthma   . Umbilical hernia   . Coronary atherosclerosis of native coronary artery     BMS circumflex 2009  . Chronic anticoagulation     Dabigatran  . Seizures     Remote history of over 20 years ago    Medications:  Scheduled:     . acetaminophen  650 mg Oral Once  . digoxin  0.125 mg Oral Daily  . diltiazem  30 mg Oral Q6H  . fludrocortisone  0.1 mg Oral Daily  . levETIRAcetam  125 mg Oral QPM  . levETIRAcetam  250 mg Oral Daily  . pantoprazole  40 mg Oral Q M,W,F  .  polyvinyl alcohol  1 drop Both Eyes QID  . potassium chloride  20 mEq Oral Daily  . simvastatin  20 mg Oral q1800  . sodium chloride  10-40 mL Intracatheter Q12H  . vancomycin  1,000 mg Intravenous Q24H  . [COMPLETED] amiodarone  150 mg Intravenous Once  . [DISCONTINUED] diltiazem  60 mg Oral Q6H  . [DISCONTINUED] furosemide  10 mg Oral BID  . [DISCONTINUED] midodrine  2.5 mg Oral Daily   Assessment: 78yo female in ICU with febrile illness.  Adding Vancomycin empirically.  Renal fxn OK for age.  Plan was to discharge soon but now on hold due to low BP.  Now afebrile but Vancomycin continued while in ICU.  Estimated Creatinine Clearance: 46 ml/min (by C-G formula based on Cr of 0.58).  Goal of Therapy:  Vancomycin trough level 15-20 mcg/ml  Plan: Vancomycin 1gm iv q24hrs Check  trough tomorrow am Monitor labs, renal fxn, and cultures per protocol Duration of therapy per MD  Valrie Hart A 04/30/2012,11:03 AM

## 2012-05-01 DIAGNOSIS — I2510 Atherosclerotic heart disease of native coronary artery without angina pectoris: Secondary | ICD-10-CM

## 2012-05-01 LAB — BASIC METABOLIC PANEL
Calcium: 8.7 mg/dL (ref 8.4–10.5)
GFR calc Af Amer: >90 mL/min (ref >90)
GFR calc non Af Amer: 82 mL/min — ABNORMAL LOW (ref >90)
Glucose, Bld: 100 mg/dL — ABNORMAL HIGH (ref 70–99)
Sodium: 136 mEq/L (ref 135–145)

## 2012-05-01 LAB — VANCOMYCIN, TROUGH: Vancomycin Tr: 6.7 ug/mL — ABNORMAL LOW (ref 10.0–20.0)

## 2012-05-01 LAB — CBC
MCH: 29.3 pg (ref 26.0–34.0)
MCHC: 32 g/dL (ref 30.0–36.0)
Platelets: 278 10*3/uL (ref 150–400)
RDW: 15.5 % (ref 11.5–15.5)

## 2012-05-01 MED ORDER — DIGOXIN 250 MCG PO TABS
0.2500 mg | ORAL_TABLET | Freq: Every day | ORAL | Status: DC
  Administered 2012-05-01 – 2012-05-04 (×4): 0.25 mg via ORAL
  Filled 2012-05-01 (×4): qty 1

## 2012-05-01 MED ORDER — DILTIAZEM HCL 30 MG PO TABS
30.0000 mg | ORAL_TABLET | Freq: Three times a day (TID) | ORAL | Status: DC
  Filled 2012-05-01: qty 1

## 2012-05-01 MED ORDER — VANCOMYCIN HCL 10 G IV SOLR
1250.0000 mg | INTRAVENOUS | Status: DC
  Filled 2012-05-01: qty 1250

## 2012-05-01 NOTE — Progress Notes (Signed)
Physical Therapy Treatment Patient Details Name: Deborah [name] MRN: [id number] DOB: [birth year] Today's Date: 05/01/2012 Time: 4098-1191 PT Time Calculation (min): 48 min Charges:  Gait X1, there activity X2  PT Assessment / Plan / Recommendation Comments on Treatment Session  Pt needed less assistance with mobility and ambulation today.  Able to transfer using a RW at Min  A to bedside commode and reclining chair maintaining TTWB to RLE (cues given to remind pt of WB status). Pt. with decreased safety awareness with transfers, using UE's inappropriately; tends to let go of walker to reach for objects.  Pt. up in recliner.    Follow Up Recommendations  SNF                 Frequency 7X/week   Plan Discharge plan remains appropriate    Precautions / Restrictions Precautions Precautions: Fall       Mobility  Bed Mobility Bed Mobility: Supine to Sit;Sitting - Scoot to Edge of Bed Right Sidelying to Sit: 4: Min assist Supine to Sit: 4: Min assist Sitting - Scoot to [place] of Bed: 3: Mod assist;4: Min assist Sit to Supine: 3: Mod assist Sit to Supine: Patient Percentage: 70% Details for Bed Mobility Assistance: Less fearful today with bed mobility; takes increased time but can complete with less assistance of therapist. Transfers Transfers: Sit to Stand;Stand to Sit Sit to Stand: 3: Mod assist Stand to Sit: 4: Min assist Squat Pivot Transfers: 3: Mod assist Ambulation/Gait Ambulation/Gait Assistance: 3: Mod assist Ambulation Distance (Feet): 5 Feet (X 2 bouts (10'total)) Assistive device: Rolling walker Gait Pattern: Step-to pattern Gait velocity: slow, cautious; slides L LE in short distances Stairs: No Wheelchair Mobility Wheelchair Mobility: No     PT Goals    Visit Information  Last PT Received On: 05/01/12 Assistance Needed: +1    Subjective Data  Subjective: Pt. reports she would like to try to sit on the [facility].  No pain reported   Cognition  Overall  Cognitive Status: Appears within functional limits for tasks assessed/performed Arousal/Alertness: Awake/alert Orientation Level: Appears intact for tasks assessed Behavior During Session: [facility] for tasks performed       End of Session PT - End of Session Equipment Utilized During Treatment: Gait belt Activity Tolerance: Patient tolerated treatment well Patient left: in chair;with call bell/phone within reach Nurse Communication: Mobility status     Lurena Nida, PTA/CLT 05/01/2012, 9:32 AM

## 2012-05-01 NOTE — Progress Notes (Signed)
[facility]             Progress Note   Subjective: Feeling well today. Slept well last night. Denies any chest pain, lightheadedness  Objective: Vital signs in last 24 hours: Temp:  [98 F (36.7 C)-98.9 F (37.2 C)] 98.2 F (36.8 C) (12/10 1600) Pulse Rate:  [91-115] 111  (12/10 0800) Resp:  [11-22] 18  (12/10 0800) BP: (74-91)/(44-67) 74/53 mmHg (12/10 1400) SpO2:  [95 %-99 %] 96 % (12/10 0800) Weight change:  Last BM Date: 04/30/12  Intake/Output from previous day: 12/09 0701 - 12/10 0700 In: 1502.1 [P.O.:1440; I.V.:60.1; IV Piggyback:2] Out: 2325 [Urine:2325]     Physical Exam: General: Alert, awake, oriented x3, in no acute distress. HEENT: No bruits, no goiter. Heart: irregular Lungs: Clear to auscultation bilaterally. Abdomen: Soft, nontender, nondistended, positive bowel sounds. Extremities: No clubbing cyanosis or edema with positive pedal pulses. Neuro: Grossly intact, nonfocal.    Lab Results: Basic Metabolic Panel:  Basename 05/01/12 0601 04/30/12 0416  NA 136 132*  K 3.6 3.6  CL 101 97  CO2 29 28  GLUCOSE 100* 96  BUN 13 14  CREATININE 0.59 0.58  CALCIUM 8.7 9.0  MG -- --  PHOS -- --   Liver Function Tests: No results found for this basename: AST:2,ALT:2,ALKPHOS:2,BILITOT:2,PROT:2,ALBUMIN:2 in the last 72 hours No results found for this basename: LIPASE:2,AMYLASE:2 in the last 72 hours No results found for this basename: AMMONIA:2 in the last 72 hours CBC:  Basename 05/01/12 0601 04/30/12 0417  WBC 5.6 8.2  NEUTROABS -- --  HGB 9.0* 10.0*  HCT 28.1* 30.8*  MCV 91.5 90.6  PLT 278 331   Cardiac Enzymes: No results found for this basename: CKTOTAL:3,CKMB:3,CKMBINDEX:3,TROPONINI:3 in the last 72 hours BNP: No results found for this basename: PROBNP:3 in the last 72 hours D-Dimer: No results found for this basename: DDIMER:2 in the last 72 hours CBG: No results found for this basename: GLUCAP:6 in the last 72  hours Hemoglobin A1C: No results found for this basename: HGBA1C in the last 72 hours Fasting Lipid Panel: No results found for this basename: CHOL,HDL,LDLCALC,TRIG,CHOLHDL,LDLDIRECT in the last 72 hours Thyroid Function Tests: No results found for this basename: TSH,T4TOTAL,FREET4,T3FREE,THYROIDAB in the last 72 hours Anemia Panel: No results found for this basename: VITAMINB12,FOLATE,FERRITIN,TIBC,IRON,RETICCTPCT in the last 72 hours Coagulation: No results found for this basename: LABPROT:2,INR:2 in the last 72 hours Urine Drug Screen: Drugs of Abuse  No results found for this basename: labopia,  cocainscrnur,  labbenz,  amphetmu,  thcu,  labbarb    Alcohol Level: No results found for this basename: ETH:2 in the last 72 hours Urinalysis: No results found for this basename: COLORURINE:2,APPERANCEUR:2,LABSPEC:2,PHURINE:2,GLUCOSEU:2,HGBUR:2,BILIRUBINUR:2,KETONESUR:2,PROTEINUR:2,UROBILINOGEN:2,NITRITE:2,LEUKOCYTESUR:2 in the last 72 hours  Recent Results (from the past 240 hour(s))  MRSA PCR SCREENING     Status: Normal   Collection Time   04/22/12  9:53 PM      Component Value Range Status Comment   MRSA by PCR NEGATIVE  NEGATIVE Final   CULTURE, BLOOD (ROUTINE X 2)     Status: Normal (Preliminary result)   Collection Time   04/27/12  4:45 AM      Component Value Range Status Comment   Specimen Description BLOOD PICC LINE DRAWN BY RN   Final    Special Requests BOTTLES DRAWN AEROBIC AND ANAEROBIC 6CC   Final    Culture NO GROWTH 4 DAYS   Final    Report Status PENDING   Incomplete   CULTURE, BLOOD (ROUTINE X  2)     Status: Normal (Preliminary result)   Collection Time   04/27/12  4:59 AM      Component Value Range Status Comment   Specimen Description BLOOD LEFT HAND   Final    Special Requests BOTTLES DRAWN AEROBIC AND ANAEROBIC 6CC   Final    Culture NO GROWTH 4 DAYS   Final    Report Status PENDING   Incomplete     Studies/Results: No results  found.  Medications: Scheduled Meds:    . acetaminophen  650 mg Oral Once  . digoxin  0.25 mg Oral Daily  . diltiazem  30 mg Oral Q8H  . fludrocortisone  0.1 mg Oral Daily  . levETIRAcetam  125 mg Oral QPM  . levETIRAcetam  250 mg Oral Daily  . pantoprazole  40 mg Oral Q M,W,F  . polyvinyl alcohol  1 drop Both Eyes QID  . potassium chloride  20 mEq Oral Daily  . simvastatin  20 mg Oral q1800  . sodium chloride  10-40 mL Intracatheter Q12H  . vancomycin  1,250 mg Intravenous Q24H  . [DISCONTINUED] digoxin  0.125 mg Oral Daily  . [DISCONTINUED] diltiazem  30 mg Oral Q6H  . [DISCONTINUED] vancomycin  1,000 mg Intravenous Q24H   Continuous Infusions:   PRN Meds:.acetaminophen, albuterol, bisacodyl, bisacodyl, morphine injection, ondansetron (ZOFRAN) IV, ondansetron, oxyCODONE, sodium chloride, traZODone  Assessment/Plan:  Principal Problem:  *Fracture of Left superior and inferior pubic rami Active Problems:  Permanent atrial fibrillation  DIASTOLIC HEART FAILURE, CHRONIC  Chronic anticoagulation  Coronary artery disease  COPD (chronic obstructive pulmonary disease)  Fall  Macular degeneration  RLQ abdominal pain  Hypotension  Traumatic retroperitoneal hematoma  Acute post-hemorrhagic anemia  Syncope  Thrombocytopenia  Hemorrhagic shock  Fever  Plan:  1. acute pubic rami fractures and retroperitoneal hemorrhage/blood loss anemia. Patient was found to have an acute retroperitoneal hemorrhage and anterior abdominal wall hematoma. This was secondary to her pelvic fractures. Case was discussed with general surgery, and supportive therapy was recommended. Her xarelto has been discontinued. She was transfused 4 units of fresh frozen plasma and 4 units of PRBC. Her hemoglobin is currently improving.. She does not have any evidence of active bleeding at this point.   2. Chronic hypotension. Patient reports that her blood pressure is chronically low. With her tachycardia, it  will be challenging to titrate her rate control medications. Midodrine was ordered to help with blood pressure, discontinued by cardiology in favor of florinef. We'll continue to monitor closely.   3. Atrial fibrillation rapid ventricular response. Patient does have chronic atrial fibrillation. She was started on amiodarone to help with rate control.  Appreciate cardiology assistance. Amiodarone has been discontinued since patient has not tolerated it in the past.  She has been started on low dose Cardizem, and her rate appears to be a little improved today. Although her blood pressure is very low in the 70s. She does not appear to be symptomatic with this. Cardizem is being further adjusted so the patient is also hypotensive. She's also been started on Florinef to help with her blood pressure.  4. Fever. Unclear etiology. Patient was started on IV vancomycin empirically. She is no longer febrile. We'll discontinue vancomycin.  5. Chronic diastolic congestive heart failure. The patient appears to be compensated at this point. She is on very small dose of oral Lasix which will be held for now in light of her hypotension.   6. Diarrhea. Likely secondary to laxative use. Laxatives  are now held in diarrhea has resolved. We'll continue to monitor.  7. COPD. Currently stable  8. Hyperglycemia. Patient had elevated blood glucose on basic metabolic panel, with blood sugars greater than 300. Confirmatory CBG revealed a blood sugar of 124. This was likely a lab error. Since then her blood sugars have been normal.  9. Disposition. Plans will be to eventually transfer the patient to skilled nursing facility for further care. Her heart rate and blood pressure will need to come under better control..  Time spent coordinating care:   LOS: 10 days   Juno Alers [facility] Pager: [phone] 05/01/2012, 8:22 PM

## 2012-05-01 NOTE — Progress Notes (Signed)
   Primary cardiologist: Previously Dr. Lewayne Bunting  SUBJECTIVE:Complains of abdominal pain and some hip pain. No palpitations.   LABS: Basic Metabolic Panel:  Basename 05/01/12 0601 04/30/12 0416  NA 136 132*  K 3.6 3.6  CL 101 97  CO2 29 28  GLUCOSE 100* 96  BUN 13 14  CREATININE 0.59 0.58  CALCIUM 8.7 9.0  MG -- --  PHOS -- --    CBC:  Basename 05/01/12 0601 04/30/12 0417  WBC 5.6 8.2  NEUTROABS -- --  HGB 9.0* 10.0*  HCT 28.1* 30.8*  MCV 91.5 90.6  PLT 278 331   Echocardiogram: 04/23/2012 Left ventricle: The cavity size was normal. There was mild concentric hypertrophy. Systolic function was normal. The estimated ejection fraction was in the range of 60% to 65%. Wall motion was normal; there were no regional wall motion abnormalities. - Aortic valve: Mildly calcified annulus. Mildly calcified leaflets. - Mitral valve: Calcified annulus. - Left atrium: The atrium was moderately dilated. - Right atrium: The atrium was mildly to moderately dilated. - Atrial septum: No defect or patent foramen ovale was identified.   PHYSICAL EXAM BP 85/46  Pulse 97  Temp 98 F (36.7 C) (Oral)  Resp 18  Ht 5\' 4"  (1.626 m)  Wt 141 lb 5 oz (64.1 kg)  BMI 24.26 kg/m2  SpO2 97% General: No acute distress Lungs: Clear bilaterally. Heart: Irregularly irregular, no significant murmur or gallop. Extremities: No clubbing, cyanosis or edema.  DP +1-2. Psych:  Good affect, mildly confused.   TELEMETRY: Reviewed telemetry pt in: Atrial fib rates between 116-70.   ASSESSMENT AND PLAN:  1. Permanent Atrial Fibrillation: Heart rates between 116 and 70 bpm. She is no longer on anticoagulation in the setting of recent fall and pubic rami fractures and retroperitoneal hemorrhage. Would not recommend return to anticoagulation at this time. She is responding to current medication regimen of digoxin, diltiazem, although heart rate control will not be optimal most likely until  comorbidities stabilize.  2. Hypotension: On Florinef. BP is still soft ranging in the high 70's to mid 80's systolic. She is still anemic with Hgb of 9.0. Creatinine of 0.59. Consider increasing digoxin to 0.25mg  daily and consider decreasing dose of diltiazem to Q 8 hours. Hypotension could also be related to pain management medications as well.   3. CAD: Stable without need for more cardiac testing at this time.   Bettey Mare. Lyman Bishop NP [facility] 05/01/2012, 7:24 AM  Attending note:  Patient seen and examined. Above-noted modified. No major change in symptomatology. Heart rate control in permanent atrial fibrillation still not optimal, although unlikely to be so until her comorbid illnesses stabilize. Try and continue short acting diltiazem at every 8 hour dosing, agree with increase in digoxin as well. Expecte Florinef to be a temporary medication. We will follow with you.  Jonelle Sidle, M.D., F.A.C.C.

## 2012-05-01 NOTE — Progress Notes (Signed)
ANTIBIOTIC CONSULT NOTE   Pharmacy Consult for Vancomycin Indication: febrile illness in ICU (empiric Rx)  Allergies  Allergen Reactions  . Omnipaque (Iohexol) Shortness Of Breath and Other (See Comments)    Short of breath with chest tightness after IV injection in CT, pt was fine when she left department but developed symptoms in parking lot and went to emergency department.  . Carbamazepine     REACTION: toxemia  . Tramadol Other (See Comments)    Unknown    Patient Measurements: Height: 5\' 4"  (162.6 cm) Weight: 141 lb 5 oz (64.1 kg) IBW/kg (Calculated) : 54.7   Vital Signs: Temp: 98.9 F (37.2 C) (12/10 0800) Temp src: Oral (12/10 0800) BP: 76/44 mmHg (12/10 0800) Pulse Rate: 111  (12/10 0800) Intake/Output from previous day: 12/09 0701 - 12/10 0700 In: 1502.1 [P.O.:1440; I.V.:60.1; IV Piggyback:2] Out: 2325 [Urine:2325] Intake/Output from this shift:    Labs:  Basename 05/01/12 0601 04/30/12 0417 04/30/12 0416 04/29/12 1700 04/29/12 0643  WBC 5.6 8.2 -- 9.6 --  HGB 9.0* 10.0* -- 10.4* --  PLT 278 331 -- 313 --  LABCREA -- -- -- -- --  CREATININE 0.59 -- 0.58 -- 0.54   Estimated Creatinine Clearance: 46 ml/min (by C-G formula based on Cr of 0.59).  Basename 05/01/12 0826  VANCOTROUGH 6.7*  VANCOPEAK --  Drue Dun --  GENTTROUGH --  GENTPEAK --  GENTRANDOM --  TOBRATROUGH --  Nolen Mu --  TOBRARND --  AMIKACINPEAK --  AMIKACINTROU --  AMIKACIN --     Microbiology: Recent Results (from the past 720 hour(s))  MRSA PCR SCREENING     Status: Normal   Collection Time   04/22/12  9:53 PM      Component Value Range Status Comment   MRSA by PCR NEGATIVE  NEGATIVE Final   CULTURE, BLOOD (ROUTINE X 2)     Status: Normal (Preliminary result)   Collection Time   04/27/12  4:45 AM      Component Value Range Status Comment   Specimen Description BLOOD PICC LINE DRAWN BY RN   Final    Special Requests BOTTLES DRAWN AEROBIC AND ANAEROBIC 6CC   Final    Culture NO GROWTH 4 DAYS   Final    Report Status PENDING   Incomplete   CULTURE, BLOOD (ROUTINE X 2)     Status: Normal (Preliminary result)   Collection Time   04/27/12  4:59 AM      Component Value Range Status Comment   Specimen Description BLOOD LEFT HAND   Final    Special Requests BOTTLES DRAWN AEROBIC AND ANAEROBIC 6CC   Final    Culture NO GROWTH 4 DAYS   Final    Report Status PENDING   Incomplete     Medical History: Past Medical History  Diagnosis Date  . Chronic diastolic heart failure   . Atrial fibrillation     Permanent  . COPD (chronic obstructive pulmonary disease)   . Drug intolerance     Flecacinide and Amiodarone   . Asthma   . Umbilical hernia   . Coronary atherosclerosis of native coronary artery     BMS circumflex 2009  . Chronic anticoagulation     Dabigatran  . Seizures     Remote history of over 20 years ago    Medications:  Scheduled:     . acetaminophen  650 mg Oral Once  . digoxin  0.25 mg Oral Daily  . diltiazem  30 mg Oral Q8H  .  fludrocortisone  0.1 mg Oral Daily  . levETIRAcetam  125 mg Oral QPM  . levETIRAcetam  250 mg Oral Daily  . pantoprazole  40 mg Oral Q M,W,F  . polyvinyl alcohol  1 drop Both Eyes QID  . potassium chloride  20 mEq Oral Daily  . simvastatin  20 mg Oral q1800  . sodium chloride  10-40 mL Intracatheter Q12H  . vancomycin  1,250 mg Intravenous Q24H  . [DISCONTINUED] digoxin  0.125 mg Oral Daily  . [DISCONTINUED] diltiazem  30 mg Oral Q6H  . [DISCONTINUED] vancomycin  1,000 mg Intravenous Q24H   Assessment: 78yo female in ICU with febrile illness currently on Vancomycin day#5.  Fever has resolved & patient clinically improved. Renal fxn is at patient's baseline.  Per MD note, IV Vancomycin to continue x 2 more days.  Cx data negative to date. Vancomycin trough today slightly below baseline.   Goal of Therapy:  Vancomycin trough 10-15 mcg/ml  Plan: 1) Increase Vancomycin 1250mg  IV q24hrs 2) Weekly trough  level if plan changes & Vancomycin to continues. 3) Monitor renal fxn and cultures per protocol 4) Duration of therapy per MD  Elson Clan 05/01/2012,11:16 AM

## 2012-05-02 LAB — CULTURE, BLOOD (ROUTINE X 2)
Culture: NO GROWTH
Culture: NO GROWTH

## 2012-05-02 LAB — CBC
HCT: 28.3 % — ABNORMAL LOW (ref 36.0–46.0)
MCHC: 32.2 g/dL (ref 30.0–36.0)
Platelets: 299 10*3/uL (ref 150–400)
RDW: 16 % — ABNORMAL HIGH (ref 11.5–15.5)

## 2012-05-02 MED ORDER — DILTIAZEM HCL 30 MG PO TABS
15.0000 mg | ORAL_TABLET | Freq: Three times a day (TID) | ORAL | Status: DC
  Administered 2012-05-02 – 2012-05-04 (×5): 15 mg via ORAL
  Filled 2012-05-02 (×6): qty 1

## 2012-05-02 NOTE — Progress Notes (Signed)
Occupational Therapy Treatment Patient Details Name: LAQUIDA COTRELL MRN: [id number] DOB: 06/24/1925 Today's Date: 05/02/2012 Time: 4098-1191 OT Time Calculation (min): 35 min Self Care 35'  OT Assessment / Plan / Recommendation Comments on Treatment Session A:  Patient completed transfer to [facility] with decreased anxiety about getting out of bed.    Follow Up Recommendations       Barriers to Discharge       Equipment Recommendations       Recommendations for Other Services    Frequency     Plan Discharge plan remains appropriate    Precautions / Restrictions Precautions Precautions: Fall Restrictions Weight Bearing Restrictions: Yes LLE Weight Bearing: Partial weight bearing   Pertinent Vitals/Pain     ADL  Toilet Transfer: Performed;Minimal assistance Toilet Transfer Method: Stand pivot;Other (comment) (with rolling walker) Toilet Transfer Equipment: Bedside commode Toileting - Clothing Manipulation and Hygiene: Performed;+1 Total assistance Where Assessed - Toileting Clothing Manipulation and Hygiene: Sit to stand from 3-in-1 or toilet    OT Diagnosis:    OT Problem List:   OT Treatment Interventions:     OT Goals    Visit Information  Last OT Received On: 05/02/12 Assistance Needed: +1    Subjective Data  Subjective: "  I need to use the bathroom"   Prior Functioning       Cognition  Overall Cognitive Status: Appears within functional limits for tasks assessed/performed Arousal/Alertness: Awake/alert Orientation Level: Appears intact for tasks assessed Behavior During Session: [facility] for tasks performed    Mobility  Shoulder Instructions Bed Mobility Supine to Sit: 3: Mod assist Sitting - Scoot to Edge of Bed: 4: Min assist Sit to Supine: 3: Mod assist Details for Bed Mobility Assistance: patient requires multiple verbal cues for sequencing of any bed mobility, pt is fearful of moving LLE and forgets instructions Transfers Sit to Stand: 3: Mod  assist Stand to Sit: 4: Min assist Details for Transfer Assistance: patient able to right lateral scoot 3' to HOB;sit<>stand patient required multiple verbal cueing for proper handplacement       Exercises  General Exercises - Upper Extremity Shoulder Flexion: Both;10 reps Shoulder Horizontal ABduction: Both;10 reps Shoulder Horizontal ADduction: 10 reps;Both General Exercises - Lower Extremity Ankle Circles/Pumps: Both;20 reps Quad Sets: 20 reps;Both Gluteal Sets: 20 reps Short Arc Quad: 20 reps;Both Long Arc Quad: Both;10 reps Heel Slides: Both;20 reps Hip ABduction/ADduction: Both;10 reps Other Exercises Other Exercises: sit<>stand x6 fort strength and endurance   Balance     End of Session OT - End of Session Equipment Utilized During Treatment: Gait belt;Other (comment) (rolling walker) Activity Tolerance: Patient tolerated treatment well Patient left: in bed;with call bell/phone within reach;with bed alarm set  GO     Dontez Hauss L 05/02/2012, 4:02 PM

## 2012-05-02 NOTE — Progress Notes (Signed)
Physical Therapy Treatment Patient Details Name: Deborah [name] MRN: [id number] DOB: [birth year] Today's Date: 05/02/2012 Time: 0865-7846 PT Time Calculation (min): 47 min 2 therex 1 gt  PT Assessment / Plan / Recommendation Comments on Treatment Session  Patient c/o of being tired today;patient is still fearful of LLE movement and was very fatigued after many  reps of bed exercises. Patient was able to complete a total of 3' (a few tiny steps fwd/back) of gait training with RW:Mod A. Patient was able to maintain TTWB status today and demonstrated proper use of RW and UE's . Patient had c/o of LBP and fatigue and requested for PT to end for the day. LBP (low back pain)    Follow Up Recommendations        Does the patient have the potential to tolerate intense rehabilitation     Barriers to Discharge        Equipment Recommendations       Recommendations for Other Services    Frequency     Plan      Precautions / Restrictions Restrictions Weight Bearing Restrictions: No   Pertinent Vitals/Pain     Mobility  Bed Mobility Supine to Sit: 3: Mod assist Sitting - Scoot to Edge of Bed: 4: Min assist Sit to Supine: 3: Mod assist Details for Bed Mobility Assistance: patient requires multiple verbal cues for sequencing of any bed mobility, pt is fearful of moving LLE and forgets instructions Transfers Transfers: Lateral/Scoot Transfers Sit to Stand: 3: Mod assist Stand to Sit: 4: Min assist Lateral/Scoot Transfers: 6: Modified independent (Device/Increase time) Details for Transfer Assistance: patient able to right lateral scoot 3' to HOB;sit<>stand patient required multiple verbal cueing for proper handplacement Ambulation/Gait Ambulation/Gait Assistance: 3: Mod assist Ambulation Distance (Feet): 3 Feet (tiny steps fwd and then back) Assistive device: Rolling walker    Exercises General Exercises - Upper Extremity Shoulder Flexion: Both;10 reps Shoulder Horizontal ABduction:  Both;10 reps Shoulder Horizontal ADduction: 10 reps;Both General Exercises - Lower Extremity Ankle Circles/Pumps: Both;20 reps Quad Sets: 20 reps;Both Gluteal Sets: 20 reps Short Arc Quad: 20 reps;Both Long Arc Quad: Both;10 reps Heel Slides: Both;20 reps Hip ABduction/ADduction: Both;10 reps Other Exercises Other Exercises: sit<>stand x6 fort strength and endurance   PT Diagnosis:    PT Problem List:   PT Treatment Interventions:     PT Goals    Visit Information  Last PT Received On: 05/02/12    Subjective Data  Subjective: I am so tired today   Cognition       Balance     End of Session PT - End of Session Equipment Utilized During Treatment: Gait belt Activity Tolerance: Patient tolerated treatment well;Patient limited by fatigue;Patient limited by pain Patient left: with call bell/phone within reach;with bed alarm set   GP     Lisaann Atha ATKINSO 05/02/2012, 2:43 PM

## 2012-05-02 NOTE — Progress Notes (Addendum)
SUBJECTIVE:Feels good, but reports generalized malaise and fatigue. Continues to experience some pain in the left upper leg/hip. Ready to go to [facility] for PT when PTH is ready for discharge.  Fracture of Left superior and inferior pubic rami  Permanent atrial fibrillation  DIASTOLIC HEART FAILURE, CHRONIC  Chronic anticoagulation  Coronary artery disease  COPD (chronic obstructive pulmonary disease)  Fall  Macular degeneration  Traumatic retroperitoneal hematoma  Acute post-hemorrhagic anemia   LABS: Basic Metabolic Panel:  Basename 05/01/12 0601 04/30/12 0416  NA 136 132*  K 3.6 3.6  CL 101 97  CO2 29 28  GLUCOSE 100* 96  BUN 13 14  CREATININE 0.59 0.58  CALCIUM 8.7 9.0  MG -- --  PHOS -- --   CBC:  Basename 05/02/12 0419 05/01/12 0601  WBC 5.9 5.6  NEUTROABS -- --  HGB 9.1* 9.0*  HCT 28.3* 28.1*  MCV 92.5 91.5  PLT 299 278   PHYSICAL EXAM BP 91/79  Pulse 111  Temp 98.4 F (36.9 C) (Oral)  Resp 21  Ht 5\' 4"  (1.626 m)  Wt 141 lb 5 oz (64.1 kg)  BMI 24.26 kg/m2  SpO2 96% General: Well developed, well nourished, in no acute distress.  Immobile in bed with inability to turn or to sit up unassisted. Head: Eyes PERRLA, No xanthomas.   Normal cephalic and atramatic  Lungs: Clear bilaterally to auscultation and percussion. Heart: HRIR S1 S2, No MRG.  Pulses-2+. bilateral carotid bruits. No JVD.  No abdominal bruits. No femoral bruits; irregular rapid rhythm Abdomen: Bowel sounds are positive, abdomen soft and non-tender without masses or hernia's noted. Msk:  Back normal, normal gait. Normal strength and tone for age. Extremities: No clubbing, cyanosis or edema.  DP +1 Neuro: Alert and oriented X 3. Psych:  Good affect, responds appropriately  TELEMETRY: Reviewed telemetry-atrial fibrillation with rate of 90-120  ASSESSMENT AND PLAN: 1. Permanent Atrial Fibrillation: Patient's heart rate is better controlled increased dose of digoxin from 0.125 to 0.25 mg.  Cardizem was changed to 30 mg Q 8 hours due to hypotension. BP has improved to her baseline readings at home. She is no longer a candidate for Xarelto . I have made follow up appointment for 2 weeks with Dr. Diona Browner in [place], as she requests to continue with him as OP.   2. Hypotension: Continues on florinef. BP is improved to her baseline of 90's systolic.Spoke with Dr. Kerry Hough concerning hypotension during his assessment after seeing her earlier this am. BP has dropped again in to the 60's and 70's systolic. Cardizem will be dropped down to 30 mg BID. Will discuss with Dr.Kamya Watling need to increase Florinef to 0.2 mg daily. Sodium is 136. She is mildly anemic but do not think this is contributing. He is holding transfer to [facility] until BP has improved.   Bettey Mare. Lyman Bishop NP [facility] 05/02/2012, 8:45 AM  Cardiology Attending Patient interviewed and examined. Discussed with Joni Reining, NP.  Above note annotated and modified based upon my findings.  Control of heart rate remains suboptimal, but options are limited.  It would be desirable to avoid additional medication with the goal of increasing blood pressure such as a higher dose of Florinef or ProAmatine.  Digoxin effect will continue to increase for at least the next week.  Blood level can be obtained at that point and a decision made regarding pharmacologic therapy.  AV nodal ablation and pacemaker implantation remains an option, but likely will not be  necessary.  Melvin Bing, MD 05/02/2012, 10 AM

## 2012-05-02 NOTE — Progress Notes (Signed)
[facility]             Progress Note   Subjective: Feels tired today, she did not sleep well.  Denies any shortness of breath  Objective: Vital signs in last 24 hours: Temp:  [98 F (36.7 C)-98.9 F (37.2 C)] 98.3 F (36.8 C) (12/11 0830) Resp:  [14-30] 21  (12/11 0600) BP: (74-110)/(39-87) 91/79 mmHg (12/11 0600) Weight change:  Last BM Date: 04/30/12  Intake/Output from previous day: 12/10 0701 - 12/11 0700 In: 240 [P.O.:240] Out: 1100 [Urine:1100] Total I/O In: 240 [P.O.:240] Out: 350 [Urine:350]   Physical Exam: General: Alert, awake, oriented x3, in no acute distress. HEENT: No bruits, no goiter. Heart: irregular Lungs: Clear to auscultation bilaterally. Abdomen: Soft, nontender, nondistended, positive bowel sounds. Extremities: No clubbing cyanosis or edema with positive pedal pulses. Neuro: Grossly intact, nonfocal.    Lab Results: Basic Metabolic Panel:  Basename 05/01/12 0601 04/30/12 0416  NA 136 132*  K 3.6 3.6  CL 101 97  CO2 29 28  GLUCOSE 100* 96  BUN 13 14  CREATININE 0.59 0.58  CALCIUM 8.7 9.0  MG -- --  PHOS -- --   Liver Function Tests: No results found for this basename: AST:2,ALT:2,ALKPHOS:2,BILITOT:2,PROT:2,ALBUMIN:2 in the last 72 hours No results found for this basename: LIPASE:2,AMYLASE:2 in the last 72 hours No results found for this basename: AMMONIA:2 in the last 72 hours CBC:  Basename 05/02/12 0419 05/01/12 0601  WBC 5.9 5.6  NEUTROABS -- --  HGB 9.1* 9.0*  HCT 28.3* 28.1*  MCV 92.5 91.5  PLT 299 278   Cardiac Enzymes: No results found for this basename: CKTOTAL:3,CKMB:3,CKMBINDEX:3,TROPONINI:3 in the last 72 hours BNP: No results found for this basename: PROBNP:3 in the last 72 hours D-Dimer: No results found for this basename: DDIMER:2 in the last 72 hours CBG: No results found for this basename: GLUCAP:6 in the last 72 hours Hemoglobin A1C: No results found for this basename: HGBA1C in the last  72 hours Fasting Lipid Panel: No results found for this basename: CHOL,HDL,LDLCALC,TRIG,CHOLHDL,LDLDIRECT in the last 72 hours Thyroid Function Tests: No results found for this basename: TSH,T4TOTAL,FREET4,T3FREE,THYROIDAB in the last 72 hours Anemia Panel: No results found for this basename: VITAMINB12,FOLATE,FERRITIN,TIBC,IRON,RETICCTPCT in the last 72 hours Coagulation: No results found for this basename: LABPROT:2,INR:2 in the last 72 hours Urine Drug Screen: Drugs of Abuse  No results found for this basename: labopia,  cocainscrnur,  labbenz,  amphetmu,  thcu,  labbarb    Alcohol Level: No results found for this basename: ETH:2 in the last 72 hours Urinalysis: No results found for this basename: COLORURINE:2,APPERANCEUR:2,LABSPEC:2,PHURINE:2,GLUCOSEU:2,HGBUR:2,BILIRUBINUR:2,KETONESUR:2,PROTEINUR:2,UROBILINOGEN:2,NITRITE:2,LEUKOCYTESUR:2 in the last 72 hours  Recent Results (from the past 240 hour(s))  MRSA PCR SCREENING     Status: Normal   Collection Time   04/22/12  9:53 PM      Component Value Range Status Comment   MRSA by PCR NEGATIVE  NEGATIVE Final   CULTURE, BLOOD (ROUTINE X 2)     Status: Normal (Preliminary result)   Collection Time   04/27/12  4:45 AM      Component Value Range Status Comment   Specimen Description BLOOD PICC LINE DRAWN BY RN   Final    Special Requests BOTTLES DRAWN AEROBIC AND ANAEROBIC 6CC   Final    Culture NO GROWTH 4 DAYS   Final    Report Status PENDING   Incomplete   CULTURE, BLOOD (ROUTINE X 2)     Status: Normal (Preliminary result)   Collection  Time   04/27/12  4:59 AM      Component Value Range Status Comment   Specimen Description BLOOD LEFT HAND   Final    Special Requests BOTTLES DRAWN AEROBIC AND ANAEROBIC 6CC   Final    Culture NO GROWTH 4 DAYS   Final    Report Status PENDING   Incomplete     Studies/Results: No results found.  Medications: Scheduled Meds:    . acetaminophen  650 mg Oral Once  . digoxin  0.25 mg Oral  Daily  . diltiazem  30 mg Oral Q8H  . fludrocortisone  0.1 mg Oral Daily  . levETIRAcetam  125 mg Oral QPM  . levETIRAcetam  250 mg Oral Daily  . pantoprazole  40 mg Oral Q M,W,F  . polyvinyl alcohol  1 drop Both Eyes QID  . potassium chloride  20 mEq Oral Daily  . simvastatin  20 mg Oral q1800  . sodium chloride  10-40 mL Intracatheter Q12H  . [DISCONTINUED] vancomycin  1,250 mg Intravenous Q24H  . [DISCONTINUED] vancomycin  1,000 mg Intravenous Q24H   Continuous Infusions:   PRN Meds:.acetaminophen, albuterol, bisacodyl, bisacodyl, morphine injection, ondansetron (ZOFRAN) IV, ondansetron, oxyCODONE, sodium chloride, traZODone  Assessment/Plan:  Principal Problem:  *Fracture of Left superior and inferior pubic rami Active Problems:  Permanent atrial fibrillation  DIASTOLIC HEART FAILURE, CHRONIC  Chronic anticoagulation  Coronary artery disease  COPD (chronic obstructive pulmonary disease)  Fall  Macular degeneration  RLQ abdominal pain  Hypotension  Traumatic retroperitoneal hematoma  Acute post-hemorrhagic anemia  Syncope  Thrombocytopenia  Hemorrhagic shock  Fever  Plan:  1. acute pubic rami fractures and retroperitoneal hemorrhage/blood loss anemia. Patient was found to have an acute retroperitoneal hemorrhage and anterior abdominal wall hematoma. This was secondary to her pelvic fractures. Case was discussed with orthopedics and general surgery, and supportive therapy was recommended. Her xarelto has been discontinued. She was transfused 4 units of fresh frozen plasma and 4 units of PRBC. Her hemoglobin is now stable. She does not have any evidence of active bleeding at this point.   2. Chronic hypotension. Patient reports that her blood pressure is chronically low. With her tachycardia, it has been challenging to titrate her rate control medications. Midodrine was initially ordered to help with blood pressure, but discontinued by cardiology in favor of florinef. By  labs and physical exam, she does not appear to be volume depleted.  TSH and cortisol were also found to be normal. She does not appear to have an underlying infectious process going on. Certainly her cardizem can be causing hypotension.  We are in the process of titrating this to achieve an acceptable blood pressure.  Blood pressures this morning ranging in systolic low 70s.    3. Atrial fibrillation rapid ventricular response. Patient does have chronic atrial fibrillation. She was briefly started on amiodarone to help with rate control.  Appreciate cardiology assistance. Amiodarone was then discontinued since patient has not tolerated it in the past.  She has been restarted on low dose Cardizem, and her rate appears to be improved today. Although her blood pressure is very low in the 70s. She does not appear to be symptomatic with this. Cardizem is being further adjusted so the patient is not so hypotensive. We will decrease cardizem to 15mg  po q8h. She's also been started on Florinef to help with her blood pressure.  4. Fever. Unclear etiology. Patient was started on IV vancomycin empirically. She is no longer febrile. We have discontinued  vancomycin.  5. Chronic diastolic congestive heart failure. The patient appears to be compensated at this point. She was on a very small dose of oral Lasix which has now been held in light of her hypotension.   6. Diarrhea. Likely secondary to laxative use. Laxatives are now held and diarrhea has resolved. We'll continue to monitor.  7. COPD. Currently stable  8. Hyperglycemia. Patient had elevated blood glucose on basic metabolic panel, with blood sugars greater than 300. Confirmatory CBG revealed a blood sugar of 124. This was likely a lab error. Since then her blood sugars have been normal.  9. Disposition. Plans will be to eventually transfer the patient to skilled nursing facility for further care once her hemodynamics are under fair control  Time spent  coordinating care:   LOS: 11 days   Jasdeep Dejarnett [facility] Pager: [phone] 05/02/2012, 9:33 AM

## 2012-05-03 LAB — CBC
MCH: 29.1 pg (ref 26.0–34.0)
MCHC: 31.4 g/dL (ref 30.0–36.0)
Platelets: 341 10*3/uL (ref 150–400)
RBC: 3.23 MIL/uL — ABNORMAL LOW (ref 3.87–5.11)

## 2012-05-03 LAB — BASIC METABOLIC PANEL
CO2: 27 mEq/L (ref 19–32)
Calcium: 8.5 mg/dL (ref 8.4–10.5)
GFR calc non Af Amer: 88 mL/min — ABNORMAL LOW (ref >90)
Sodium: 138 mEq/L (ref 135–145)

## 2012-05-03 LAB — OCCULT BLOOD X 1 CARD TO LAB, STOOL: Fecal Occult Bld: NEGATIVE

## 2012-05-03 MED ORDER — ALTEPLASE 2 MG IJ SOLR
2.0000 mg | Freq: Once | INTRAMUSCULAR | Status: AC
  Administered 2012-05-03: 2 mg
  Filled 2012-05-03: qty 2

## 2012-05-03 MED ORDER — ATORVASTATIN CALCIUM 40 MG PO TABS
40.0000 mg | ORAL_TABLET | Freq: Every day | ORAL | Status: DC
  Administered 2012-05-03: 40 mg via ORAL
  Filled 2012-05-03: qty 1

## 2012-05-03 MED ORDER — MIDODRINE HCL 5 MG PO TABS
2.5000 mg | ORAL_TABLET | Freq: Two times a day (BID) | ORAL | Status: DC
  Administered 2012-05-03 – 2012-05-04 (×3): 2.5 mg via ORAL
  Filled 2012-05-03 (×9): qty 0.5

## 2012-05-03 NOTE — Progress Notes (Signed)
Patient: Deborah [name] / Admit Date: 04/21/2012 / Date of Encounter: 05/03/2012, 10:55 AM   Subjective  She was so happy she cried this morning to progress to sitting in the chair. No CP, palpitations, SOB. Abd is mildly tender.    Objective   Telemetry: atrial fib rates controlled, 1.96sec pause overnight, PVCs/occ couplet, 1 triplet Physical Exam: Filed Vitals:   05/03/12 0600  BP: 101/61  Pulse: 79  Temp: 98.8  Resp: 19  98 RA General: Well developed, well nourished WF in no acute distress. Head: Normocephalic, atraumatic, sclera non-icteric, no xanthomas, nares are without discharge. Extensive violaceous birthmark from clavicular line to back of head. Neck:JVD not elevated. Lungs: Clear bilaterally to auscultation without wheezes, rales, or rhonchi. Breathing is unlabored. Heart: Irregularly irregular but rate controlled S1 S2 without murmurs, rubs, or gallops.  Abdomen: Soft, mild tenderness lower quadrants, protuberant but not distended with normoactive bowel sounds. No hepatomegaly. No rebound/guarding. No obvious abdominal masses. Msk:  Tone appears normal for age. LE MSK exam limited due to fx. Extremities: No clubbing or cyanosis. No edema.  Distal pedal pulses are in tact and equal bilaterally. Neuro: Alert and oriented X 3. Moves all extremities spontaneously. Psych:  Responds to questions appropriately with a normal affect.    Intake/Output Summary (Last 24 hours) at 05/03/12 1055 Last data filed at 05/03/12 0746  Gross per 24 hour  Intake     30 ml  Output   1300 ml  Net  -1270 ml    Inpatient Medications:    . acetaminophen  650 mg Oral Once  . digoxin  0.25 mg Oral Daily  . diltiazem  15 mg Oral Q8H  . levETIRAcetam  125 mg Oral QPM  . levETIRAcetam  250 mg Oral Daily  . midodrine  2.5 mg Oral BID WC  . pantoprazole  40 mg Oral Q M,W,F  . polyvinyl alcohol  1 drop Both Eyes QID  . potassium chloride  20 mEq Oral Daily  . simvastatin  20 mg Oral q1800    . sodium chloride  10-40 mL Intracatheter Q12H  . [DISCONTINUED] fludrocortisone  0.1 mg Oral Daily    Labs:  [facility] 05/03/12 0438 05/01/12 0601  NA 138 136  K 3.5 3.6  CL 104 101  CO2 27 29  GLUCOSE 88 100*  BUN 9 13  CREATININE 0.48* 0.59  CALCIUM 8.5 8.7  MG -- --  PHOS -- --    Basename 05/03/12 0438 05/02/12 0419  WBC 6.0 5.9  NEUTROABS -- --  HGB 9.4* 9.1*  HCT 29.9* 28.3*  MCV 92.6 92.5  PLT 341 299    Radiology/Studies:  Ct Abdomen Pelvis Wo Contrast 04/27/2012  *RADIOLOGY REPORT*  Clinical Data: Fever.  Follow up retroperitoneal hematoma  CT ABDOMEN AND PELVIS WITHOUT CONTRAST  Technique:  Multidetector CT imaging of the abdomen and pelvis was performed following the standard protocol without intravenous contrast.  Comparison: 04/22/2012  Findings: Lung bases:  Interval development of small bilateral pleural effusions.  Heart size is normal.  No pericardial effusion.  Abdomen/pelvis:  There is no focal liver abnormality.  Gallbladder appears normal.  No biliary dilatation.  The pancreas is unremarkable.  The spleen is negative.  Bilateral low attenuation nodules are identified within the adrenal glands.  Unchanged from previous studies.  Likely small adenomas. Mild perinephric fat stranding is noted bilaterally.  This is similar to previous exam.  No hydronephrosis or hydroureter. Right iliac fossa and right pelvic side wall  hematoma measures 15 x 8.3 x 7.7 cm.  Previously this measured 15.7 x 9 x 8.7 cm.  This exerts significant mass effect upon the urinary bladder, uterus and adnexal structures. Stable to improved appearance of bilateral retroperitoneal hematomas.  There has been interval decrease in bilateral rectus muscle hematomas.  The stomach and the small bowel loops appear within normal limits. Normal appearance of the colon.  Bones/Musculoskeletal:  Again identified are left inferior and superior pubic rami fractures as well as fractures involving the left pubic  bone.  IMPRESSION:  1.  Stable to improved appearance of large pelvic hematoma, bilateral retroperitoneal hemorrhage, and rectus muscle hematomas. 2.  Stable pelvic fractures. 3.  Small bilateral pleural effusions are new from previous exam.   Original Report Authenticated By: Signa Kell, M.D.    Dg Sacrum/coccyx 04/21/2012  *RADIOLOGY REPORT*  Clinical Data: Fall with sacral pain.  SACRUM AND COCCYX - 2+ VIEW  Comparison: None.  Findings: No acute fractures are seen involving the sacrum or coccyx.  Bony evaluation is somewhat limited due to severe osteopenia.  No soft tissue abnormalities are identified.  IMPRESSION: No obvious sacral or coccygeal fractures.   Original Report Authenticated By: Irish Lack, M.D.    Dg Hip Complete Left 04/21/2012  *RADIOLOGY REPORT*  Clinical Data: Fall with left hip and groin pain.  LEFT HIP - COMPLETE 2+ VIEW  Comparison: 08/02/2009  Findings: Mildly displaced and comminuted fractures are seen involving both the superior and inferior pubic rami on the left. There is no evidence of diastasis of the pubic symphysis.  No other acute injuries are identified. There is moderate osteoarthritis with joint space narrowing of both hip joints.  Soft tissues are unremarkable.  IMPRESSION: Acute and mildly displaced fractures of both the superior and inferior pubic rami on the left.   Original Report Authenticated By: Irish Lack, M.D.    US Venous Img Lower Bilateral 04/27/2012  *RADIOLOGY REPORT*  Clinical Data: History of fever.  History of bilateral leg pain.  BILATERAL LOWER EXTREMITY VENOUS DUPLEX ULTRASOUND  Technique:  Gray-scale sonography with graded compression, as well as color Doppler and duplex ultrasound, were performed to evaluate the deep venous system of both lower extremities from the level of the common femoral vein through the popliteal and proximal calf veins.  Spectral Doppler was utilized to evaluate flow at rest and with distal augmentation maneuvers.   Comparison:  None.  Findings:  Normal compressibility of bilateral common femoral, superficial femoral, and popliteal veins is demonstrated, as well as the visualized proximal calf veins.  No filling defects to suggest DVT on grayscale or color Doppler imaging.  Doppler waveforms show normal direction of venous flow, normal respiratory phasicity and response to augmentation.  IMPRESSION: No evidence of deep venous thrombosis in either leg.   Original Report Authenticated By: Onalee Hua Call    Dg Chest Port 1 View 04/29/2012  *RADIOLOGY REPORT*  Clinical Data: Short of breath.  Weakness.  Cough.  PORTABLE CHEST - 1 VIEW  Comparison: 04/27/2012.  Findings: Right upper extremity PICC is unchanged the tip at the cavoatrial junction.  No airspace disease.  No effusion. Cardiopericardial silhouette is unchanged, upper limits of normal for projection.  Extensive calcification of the tracheobronchial tree. Monitoring leads are projected over the chest.  IMPRESSION: No interval change or acute cardiopulmonary disease.   Original Report Authenticated By: Andreas Newport, M.D.     2D Echo 04/23/12 Study Conclusions - Left ventricle: The cavity size was normal. There was mild  concentric hypertrophy. Systolic function was normal. The estimated ejection fraction was in the range of 60% to 65%. Wall motion was normal; there were no regional wall motion abnormalities. - Aortic valve: Mildly calcified annulus. Mildly calcified leaflets. - Mitral valve: Calcified annulus. - Left atrium: The atrium was moderately dilated. - Right atrium: The atrium was mildly to moderately dilated. - Atrial septum: No defect or patent foramen ovale was identified.    Assessment and Plan  1. Pelvic fracture from mechanical fall - will need rehab.  2. Large pelvic hematoma/traumatic retroperitoneal hematoma/hemorrhage into anterior abdominal wall - managed conservatively. 3. ABL anemia - s/p transfusion, stable today. 4. Coagulopathy  from Xarelto prior to admission -  stopped & supported with FFP.  5. Question of syncope spell early in hospitalization - felt secondary to hemorrhagic shock. LV function normal on echo. Close OP cardiac f/u (let us know when pt is discharged so we can make sure to arrange). 6. Permanent atrial fibrillation - rates improved on digoxin, cardizem adjustment. Will need digoxin level within 1 week. Will change simvastatin to lipitor given max recommended dose while on concomitant diltiazem. 7. Hypotension - BP stable this AM, upper 80s-low 100s. Cardizem decreased yesterday. Florinef changed to midodrine by primary team this AM.  Ronie Spies PA-C  Cardiology Attending Patient interviewed and examined. Discussed with Dayna Dunn PA-C.  Above note annotated and modified based upon my findings.  Heart rate is now adequately controlled.  No objection to midodrine.  She is 4 days out from trauma.  I suspect anticoag. Could be safely resumed but would wait another 4 days->resume rivaroxaban at previous dose.  OK for discharge from my point of view.  F/U with Dr. Diona Browner in 1 week.  [place] Bing, MD 05/03/2012, 5:44 PM

## 2012-05-03 NOTE — Progress Notes (Signed)
Occupational Therapy Treatment Patient Details Name: Deborah [name] MRN: [id number] DOB: [birth year] Today's Date: 05/03/2012 Time: 7829-5621 OT Time Calculation (min): 18 min  OT Assessment / Plan / Recommendation Comments on Treatment Session Patient was agreeable to get into recliner. Once standing, patient requested to use bedside commode. Patient stood with supervision with walker while therapist brought commode near bed. Patient was very happy with progress she has made while here. Patient had no complaints of pain with movment or in rest. No episodes of anxiety during transfer.                   Plan Discharge plan remains appropriate;Frequency remains appropriate    Precautions / Restrictions Precautions Precautions: Fall Restrictions Weight Bearing Restrictions: No LLE Weight Bearing: Weight bearing as tolerated   Pertinent Vitals/Pain No complaints.    ADL  Toilet Transfer: Performed;Min Pension scheme manager Method: Stand pivot (with rolling walker) Toilet Transfer Equipment: Bedside commode Toileting - Clothing Manipulation and Hygiene: Performed;Moderate assistance Where Assessed - Toileting Clothing Manipulation and Hygiene: Sit to stand from 3-in-1 or toilet Equipment Used: Rolling walker;Gait belt Transfers/Ambulation Related to ADLs: Patient transferred at a Min Assist level to bedside commode and to recliner with walker. Min vc's for hand placement and for direction due to visual impairment.       OT Goals ADL Goals ADL Goal: Toilet Transfer - Progress: Met  Visit Information  Last OT Received On: 05/03/12 Assistance Needed: +1    Subjective Data  Subjective: "I am so happy with how much better I am moving...and without pain!" Patient Stated Goal: To use commode.      Cognition  Overall Cognitive Status: Appears within functional limits for tasks assessed/performed Arousal/Alertness: Awake/alert Orientation Level: Appears intact for tasks  assessed Behavior During Session: [facility] for tasks performed    Mobility    Transfers Transfers: Sit to Stand;Stand to Sit Sit to Stand: 4: Min assist;From bed;With upper extremity assist Stand to Sit: 4: Min assist;To toilet;With upper extremity assist             End of Session OT - End of Session Equipment Utilized During Treatment: Gait belt;Other (comment) (rolling walker) Activity Tolerance: Patient tolerated treatment well Patient left: in chair;with call bell/phone within reach  [facility], OTR/L 05/03/2012, 3:50 PM

## 2012-05-03 NOTE — Progress Notes (Signed)
UR Chart Review Completed  

## 2012-05-03 NOTE — Plan of Care (Signed)
Problem: Phase II Progression Outcomes Goal: Progress activity as tolerated unless otherwise ordered Outcome: Completed/Met Date Met:  05/03/12 oob with PT today, patient ambulated today Goal: Vital signs remain stable Outcome: Progressing Hypotensive remains as per patient normal

## 2012-05-03 NOTE — Progress Notes (Signed)
Nutrition Brief Note  Patient identified due to LOS. Chart reviewed and talked with pt who reports good appetite and "really likes the food". Based on wt hx no significant wt change noted. She is on daily wt monitoring and has hx of CHF. She will be d/c to [facility] possibly tomorrow.  Body mass index is 24.26 kg/(m^2). Pt meets criteria for normal based on current BMI.   Wt Readings from Last 10 Encounters:  04/29/12 141 lb 5 oz (64.1 kg)  08/11/11 142 lb 14.4 oz (64.819 kg)  08/09/11 143 lb (64.864 kg)  06/12/11 135 lb (61.236 kg)  12/29/10 143 lb 12.8 oz (65.227 kg)  02/22/10 144 lb (65.318 kg)  07/30/09 143 lb (64.864 kg)  05/14/09 142 lb 8 oz (64.638 kg)  04/14/09 143 lb (64.864 kg)    Current diet order is Heart Healthy , patient is consuming approximately 50% of meals at this time per pt. Labs and medications reviewed.   No nutrition interventions warranted at this time. If nutrition issues arise, please consult RD.   #[phone]

## 2012-05-03 NOTE — Clinical Social Work Note (Signed)
CSW updated [facility] on progress and are aware of potential d/c tomorrow. CSW to continue to follow.  Derenda Fennel, [place] [phone]

## 2012-05-03 NOTE — Progress Notes (Signed)
[facility]             Progress Note   Subjective: The patient wants to try to get out of bed to the chair today. No other new complaints.  Objective: Vital signs in last 24 hours: Temp:  [98.5 F (36.9 C)-98.8 F (37.1 C)] 98.8 F (37.1 C) (12/12 0000) Resp:  [14-26] 19  (12/12 0600) BP: (66-108)/(48-71) 101/61 mmHg (12/12 0600) SpO2:  [96 %-98 %] 98 % (12/12 0306) Weight change:  Last BM Date: 05/02/12  Intake/Output from previous day: 12/11 0701 - 12/12 0700 In: 270 [P.O.:240; I.V.:30] Out: 1150 [Urine:1150] Total I/O In: -  Out: 500 [Urine:500]   Physical Exam: General: Pleasant alert 78 year old sitting up in bed, in no acute distress. Her daughter is in her room, questions answered. Heart: irregular, irregular with borderline tachycardia Lungs: Clear to auscultation bilaterally. Abdomen: Positive bowel sounds, softer compared to last week, mild tenderness right lower quadrant, protuberant, nondistended. Extremities: No pedal edema. Pedal pulses palpable. Neuro: Grossly intact, nonfocal.    Lab Results: Basic Metabolic Panel:  Basename 05/03/12 0438 05/01/12 0601  NA 138 136  K 3.5 3.6  CL 104 101  CO2 27 29  GLUCOSE 88 100*  BUN 9 13  CREATININE 0.48* 0.59  CALCIUM 8.5 8.7  MG -- --  PHOS -- --   Liver Function Tests: No results found for this basename: AST:2,ALT:2,ALKPHOS:2,BILITOT:2,PROT:2,ALBUMIN:2 in the last 72 hours No results found for this basename: LIPASE:2,AMYLASE:2 in the last 72 hours No results found for this basename: AMMONIA:2 in the last 72 hours CBC:  Basename 05/03/12 0438 05/02/12 0419  WBC 6.0 5.9  NEUTROABS -- --  HGB 9.4* 9.1*  HCT 29.9* 28.3*  MCV 92.6 92.5  PLT 341 299   Cardiac Enzymes: No results found for this basename: CKTOTAL:3,CKMB:3,CKMBINDEX:3,TROPONINI:3 in the last 72 hours BNP: No results found for this basename: PROBNP:3 in the last 72 hours D-Dimer: No results found for this basename:  DDIMER:2 in the last 72 hours CBG: No results found for this basename: GLUCAP:6 in the last 72 hours Hemoglobin A1C: No results found for this basename: HGBA1C in the last 72 hours Fasting Lipid Panel: No results found for this basename: CHOL,HDL,LDLCALC,TRIG,CHOLHDL,LDLDIRECT in the last 72 hours Thyroid Function Tests: No results found for this basename: TSH,T4TOTAL,FREET4,T3FREE,THYROIDAB in the last 72 hours Anemia Panel: No results found for this basename: VITAMINB12,FOLATE,FERRITIN,TIBC,IRON,RETICCTPCT in the last 72 hours Coagulation: No results found for this basename: LABPROT:2,INR:2 in the last 72 hours Urine Drug Screen: Drugs of Abuse  No results found for this basename: labopia,  cocainscrnur,  labbenz,  amphetmu,  thcu,  labbarb    Alcohol Level: No results found for this basename: ETH:2 in the last 72 hours Urinalysis: No results found for this basename: COLORURINE:2,APPERANCEUR:2,LABSPEC:2,PHURINE:2,GLUCOSEU:2,HGBUR:2,BILIRUBINUR:2,KETONESUR:2,PROTEINUR:2,UROBILINOGEN:2,NITRITE:2,LEUKOCYTESUR:2 in the last 72 hours  Recent Results (from the past 240 hour(s))  CULTURE, BLOOD (ROUTINE X 2)     Status: Normal   Collection Time   04/27/12  4:45 AM      Component Value Range Status Comment   Specimen Description BLOOD PICC LINE DRAWN BY RN   Final    Special Requests BOTTLES DRAWN AEROBIC AND ANAEROBIC 6CC   Final    Culture NO GROWTH 5 DAYS   Final    Report Status 05/02/2012 FINAL   Final   CULTURE, BLOOD (ROUTINE X 2)     Status: Normal   Collection Time   04/27/12  4:59 AM      Component  Value Range Status Comment   Specimen Description BLOOD LEFT HAND   Final    Special Requests BOTTLES DRAWN AEROBIC AND ANAEROBIC 6CC   Final    Culture NO GROWTH 5 DAYS   Final    Report Status 05/02/2012 FINAL   Final     Studies/Results: No results found.  Medications: Scheduled Meds:    . acetaminophen  650 mg Oral Once  . digoxin  0.25 mg Oral Daily  . diltiazem  15  mg Oral Q8H  . fludrocortisone  0.1 mg Oral Daily  . levETIRAcetam  125 mg Oral QPM  . levETIRAcetam  250 mg Oral Daily  . pantoprazole  40 mg Oral Q M,W,F  . polyvinyl alcohol  1 drop Both Eyes QID  . potassium chloride  20 mEq Oral Daily  . simvastatin  20 mg Oral q1800  . sodium chloride  10-40 mL Intracatheter Q12H  . [DISCONTINUED] diltiazem  30 mg Oral Q8H   Continuous Infusions:   PRN Meds:.acetaminophen, albuterol, bisacodyl, bisacodyl, morphine injection, ondansetron (ZOFRAN) IV, ondansetron, oxyCODONE, sodium chloride, traZODone  Assessment/Plan:  Principal Problem:  *Fracture of Left superior and inferior pubic rami Active Problems:  Traumatic retroperitoneal hematoma  Acute post-hemorrhagic anemia  Hemorrhagic shock  Permanent atrial fibrillation  DIASTOLIC HEART FAILURE, CHRONIC  Chronic anticoagulation  Coronary artery disease  COPD (chronic obstructive pulmonary disease)  Fall  Macular degeneration  RLQ abdominal pain  Hypotension  Syncope  Thrombocytopenia  Fever  Plan:  1. acute pubic rami fractures and retroperitoneal hemorrhage/blood loss anemia. Patient was found to have an acute retroperitoneal hemorrhage and anterior abdominal wall hematoma. This was secondary to her pelvic fractures. Case was discussed with orthopedics and general surgery, and supportive therapy was recommended. Her xarelto has been discontinued. She was transfused 4 units of fresh frozen plasma and 4 units of PRBC. Her hemoglobin is now stable. She does not have any evidence of active bleeding at this point.    Chronic hypotension. Patient reports that her blood pressure is chronically low. With her tachycardia, it has been challenging to titrate her rate control medications. Midodrine was initially ordered to help with blood pressure, but discontinued by cardiology in favor of florinef. I favor discontinuing Florinef in favor of midodrine because of the potential for electrolyte  abnormalities with Florinef. Also, as a side effect, midodrine may decrease her heart rate  which will help with the patient's A. fib with RVR. By labs and physical exam, she does not appear to be volume depleted.  TSH and cortisol were also found to be normal. She does not appear to have an underlying infectious process going on.     Atrial fibrillation rapid ventricular response. Patient does have chronic atrial fibrillation. She was briefly started on amiodarone to help with rate control.  Appreciate cardiology assistance. Amiodarone was then discontinued since patient has not tolerated it in the past.  She has been restarted on low dose Cardizem, and her rate appears to be improved today. It was titrated down to 15 mg every 8 hours due to to her blood pressure being in the 70s yesterday. Today, her systolic blood pressures so far in the mid 80s to low 100s. She does not appear to be symptomatic with this.   Fever. Unclear etiology. Patient was started on IV vancomycin empirically. She is no longer febrile. We have discontinued vancomycin.   Chronic diastolic congestive heart failure. The patient appears to be compensated at this point. She was on a  very small dose of oral Lasix which has now been held in light of her hypotension.    Diarrhea. Likely secondary to laxative use. Laxatives are now held and diarrhea has resolved. We'll continue to monitor.   COPD. Currently stable   Hyperglycemia. Patient had elevated blood glucose on basic metabolic panel, with blood sugars greater than 300. Confirmatory CBG revealed a blood sugar of 124. This was likely a lab error. Since then her blood sugars have been normal.   Disposition. Plans will be to eventually transfer the patient to skilled nursing facility for further care once her hemodynamics are under fair control. We'll make changes with medications above. We'll continue PT/OT. The patient would like to get out of bed to the chair today. We'll monitor  over the next 24 hours. If her blood pressure and heart rate are reasonable and she is not symptomatic, we'll consider discharge tomorrow to the [facility].  Time spent coordinating care:   LOS: 12 days   Kylea Berrong [facility] Pager: [phone] 05/03/2012, 8:31 AM

## 2012-05-03 NOTE — Progress Notes (Signed)
Pt up to BSC, BP 76/48, P 95, asymptomatic, c/o pain in back and left hip.

## 2012-05-03 NOTE — Progress Notes (Signed)
Pt sitting in chair, resting, no c/o dizziness from low BP 86/58, P 91.

## 2012-05-03 NOTE — Progress Notes (Signed)
Physical Therapy Treatment Patient Details Name: Deborah [name] MRN: [id number] DOB: [birth year] Today's Date: 05/03/2012 Time: 2956-2130 PT Time Calculation (min): 15 min 1 gt  PT Assessment / Plan / Recommendation Comments on Treatment Session  Patient improving on gait distance however has difficulty maintain TTWB of LLE due to not being able to offload weight to UEs properly.Patient completed 74' with RW;Mod A for sequencing  and maintaining WB status.     Follow Up Recommendations        Does the patient have the potential to tolerate intense rehabilitation     Barriers to Discharge        Equipment Recommendations       Recommendations for Other Services    Frequency     Plan      Precautions / Restrictions     Pertinent Vitals/Pain     Mobility  Bed Mobility Supine to Sit: 3: Mod assist Details for Bed Mobility Assistance: assistance required to lift BLE into bed Transfers Sit to Stand: 3: Mod assist Stand to Sit: 3: Mod assist Details for Transfer Assistance: assistance needed with balancing upon standing and verbal cueing to back completely to surface before sitting Ambulation/Gait Ambulation/Gait Assistance: 3: Mod assist Ambulation Distance (Feet): 10 Feet Assistive device: Rolling walker Ambulation/Gait Assistance Details: difficult for patient to maintain TTWB due to not using UE properly for offsetting LLE weight Gait Pattern: Step-to pattern;Shuffle Gait velocity: very slow, cautious Stairs: No Wheelchair Mobility Wheelchair Mobility: No    Exercises General Exercises - Upper Extremity Shoulder Flexion: Both;10 reps General Exercises - Lower Extremity Long Arc Quad: Both;10 reps   PT Diagnosis:    PT Problem List:   PT Treatment Interventions:     PT Goals    Visit Information  Last PT Received On: 05/03/12    Subjective Data      Cognition       Balance     End of Session PT - End of Session Equipment Utilized During Treatment:  Gait belt Activity Tolerance: Patient tolerated treatment well Patient left: in bed;with call bell/phone within reach (bed alarm not working;nursing notified) Nurse Communication: Mobility status   GP     Deborah [name] 05/03/2012, 1:49 PM

## 2012-05-04 ENCOUNTER — Inpatient Hospital Stay
Admission: RE | Admit: 2012-05-04 | Discharge: 2012-06-13 | Disposition: A | Discharge status: Home / Self Care | Payer: BLUE CROSS/BLUE SHIELD | Source: Ambulatory Visit | Attending: Internal Medicine | Admitting: Internal Medicine

## 2012-05-04 DIAGNOSIS — Z7901 Long term (current) use of anticoagulants: Secondary | ICD-10-CM

## 2012-05-04 LAB — CBC
Platelets: 379 10*3/uL (ref 150–400)
RBC: 3.47 MIL/uL — ABNORMAL LOW (ref 3.87–5.11)
WBC: 6.9 10*3/uL (ref 4.0–10.5)

## 2012-05-04 MED ORDER — DIGOXIN 250 MCG PO TABS: 0.2500 mg | ORAL_TABLET | Freq: Every day | ORAL | Status: DC

## 2012-05-04 MED ORDER — ATORVASTATIN CALCIUM 40 MG PO TABS: 40.0000 mg | ORAL_TABLET | Freq: Every day | ORAL | Status: DC

## 2012-05-04 MED ORDER — DILTIAZEM HCL 30 MG PO TABS: 15.0000 mg | ORAL_TABLET | Freq: Three times a day (TID) | ORAL | Status: DC

## 2012-05-04 MED ORDER — MIDODRINE HCL 2.5 MG PO TABS: 2.5000 mg | ORAL_TABLET | Freq: Two times a day (BID) | ORAL | Status: DC

## 2012-05-04 MED ORDER — ASPIRIN 81 MG PO TBEC: 81.0000 mg | DELAYED_RELEASE_TABLET | Freq: Every day | ORAL | Status: DC

## 2012-05-04 NOTE — Clinical Social Work Note (Signed)
Pt d/c today to [facility]. Pt, pt's daughter, and facility aware and agreeable. Admission paperwork already completed. Pt to transfer with RN. D/C summary faxed.  Derenda Fennel, [place] [phone]

## 2012-05-04 NOTE — Progress Notes (Signed)
SUBJECTIVE: Complaints of shoulder soreness and requests heating pad.   Principal Problem:  *Fracture of Left superior and inferior pubic rami Active Problems:  Permanent atrial fibrillation  DIASTOLIC HEART FAILURE, CHRONIC  Chronic anticoagulation  Coronary artery disease  COPD (chronic obstructive pulmonary disease)  Fall  Macular degeneration  RLQ abdominal pain  Hypotension  Traumatic retroperitoneal hematoma  Acute post-hemorrhagic anemia  Syncope  Thrombocytopenia  Hemorrhagic shock  Fever   LABS: Basic Metabolic Panel:  [facility] 05/03/12 0438  NA 138  K 3.5  CL 104  CO2 27  GLUCOSE 88  BUN 9  CREATININE 0.48*  CALCIUM 8.5  MG --  PHOS --   CBC:  Basename 05/04/12 0508 05/03/12 0438  WBC 6.9 6.0  NEUTROABS -- --  HGB 10.2* 9.4*  HCT 32.5* 29.9*  MCV 93.7 92.6  PLT 379 341   PHYSICAL EXAM BP 92/66  Pulse 95  Temp 98.5 F (36.9 C) (Oral)  Resp 19  Ht 5\' 4"  (1.626 m)  Wt 141 lb 5 oz (64.1 kg)  BMI 24.26 kg/m2  SpO2 97% General: Well developed, well nourished, in no acute distress, complains of shoulder soreness.  Head: Eyes PERRLA, No xanthomas.   Normal cephalic and atramatic  Lungs: Clear bilaterally to auscultation and percussion. Heart: HRIR S1 S2, No MRG .  Pulses are 2+ & equal.            No carotid bruit. No JVD.  No abdominal bruits. No femoral bruits. Abdomen: Bowel sounds are positive, abdomen soft and non-tender without masses or                  Hernia's noted. Msk:  Back normal, normal gait. Normal strength and tone for age.Birthmark noted on upper left shoulder, to back of scalp. Extremities: No clubbing, cyanosis or edema.  DP +1 Neuro: Alert and oriented X 3. Psych:  Good affect, responds appropriately  TELEMETRY: Reviewed telemetry pt in Atrial fibrillation rates in the 90's.   ASSESSMENT AND PLAN:  1. Atrial fibrillation: Rate is controlled at present with digoxin 0.25mg  daily and low dose cardizem 15 mg Q 8 hours.  Plans for follow up dig level prior to appointment with Dr. Diona Browner in one week. She is being released to the [facility] for PT today. Creatinine 0.48 on 05/03/12. Dr.Zakar Brosch recommends that rivaroxaban can be safely resumed at previous dose in approx 4 days.   2. Hypotension: Medication adjustments noted. Midodrine 2.5 mg has been restarted by [facility] in lieu of [place]ne[place]. With BP control systolic ranging from 114/56 to 93/57. She appears to do better with midodrine, and decreased dose of Cardizem.    3. Anemia: Hgb has improved to 10.2 this am.Xarelto has been stopped, and FFP given during hospitalization. As above, can be restarted in 4 days. Stable.  Bettey Mare. Lyman Bishop NP [facility] 05/04/2012, 8:23 AM  Cardiology Attending Patient interviewed and examined. Discussed with Joni Reining, NP.  Above note annotated and modified based upon my findings.  Plans are for discharge today and management of medication as noted above.  Digoxin dose is reasonable with near normal renal function.  [place], MD 05/04/2012

## 2012-05-04 NOTE — Clinical Social Work Placement (Signed)
Clinical Social Work Department CLINICAL SOCIAL WORK PLACEMENT NOTE 05/04/2012  Patient:  Deborah [name], Deborah [name]  Account Number:  [id number] Admit date:  04/21/2012  Clinical Social Worker:  Derenda Fennel, LCSW  Date/time:  04/23/2012 11:52 AM  Clinical Social Work is seeking post-discharge placement for this patient at the following level of care:   SKILLED NURSING   (*CSW will update this form in Epic as items are completed)   04/23/2012  Patient/family provided with [facility] Department of Clinical Social Work's list of facilities offering this level of care within the geographic area requested by the patient (or if unable, by the patient's family).  04/23/2012  Patient/family informed of their freedom to choose among providers that offer the needed level of care, that participate in Medicare, Medicaid or managed care program needed by the patient, have an available bed and are willing to accept the patient.  04/23/2012  Patient/family informed of [facility]' ownership interest in [facility] as well as of the fact that they are under no obligation to receive care at this facility.  PASARR submitted to [employer] on 04/23/2012 PASARR number received from [employer] on 04/23/2012  FL2 transmitted to all facilities in geographic area requested by pt/family on  04/23/2012 FL2 transmitted to all facilities within larger geographic area on   Patient informed that his/her managed care company has contracts with or will negotiate with  certain facilities, including the following:     Patient/family informed of bed offers received:  04/24/2012 Patient chooses bed at [facility] Physician recommends and patient chooses bed at  [facility]  Patient to be transferred to [facility] on  05/04/2012 Patient to be transferred to facility by RN  The following physician request were entered in Epic:   Additional Comments:  Derenda Fennel, LCSW [phone]

## 2012-05-04 NOTE — Discharge Summary (Signed)
Physician Discharge Summary  Deborah [name]:[id number] DOB: Oct 10, 1925 DOA: 04/21/2012  PCP: Ignatius Specking., MD  Admit date: 04/21/2012 Discharge date: 05/04/2012  Time spent: Greater than 30 minutes  Recommendations for Outpatient Follow-up:  1. The plan is for the patient to be discharged to the [facility] today. 2. The patient will need a digoxin level checked or reassessed in one week.  Discharge Diagnoses:  1. Acute fractures of the left superior and inferior pubic rami, secondary to fall. 2. Traumatic retroperitoneal hemorrhage, large pelvic hematoma, and rectus muscles hematoma, secondary to fall in the setting of pelvic fracture and anticoagulation with Xarelto. 3. Acute post hemorrhagic anemia, status post a total of 4 units of packed red blood cell transfusions. 4. Chronic atrial fibrillation.        A) Anticoagulation has been discontinued because of serious bleeding and risk of fall, but the consideration for restarting it will be deferred to Dr. Diona Browner, the patient's primary cardiologist when he sees her at the followup appointment. The patient and family prefers aspirin therapy for now.       B.) Aspirin at 81 mg daily was restarted upon discharge.       C.) Cardizem dosing was changed from 120 mg daily to 15 mg every 8 hours due to the patient's chronically low blood pressures. (History of amiodarone intolerance in the past).       D.) Digoxin dosing was increased from 0.125 mg to 0.25 mg daily. 5. Hypotension secondary to hemorrhagic shock. 6. Chronically low blood pressures with her systolic blood pressures generally ranging in the upper 80s to low 90s without being symptomatic. 7. Syncope, secondary to hemorrhagic shock.. 8. Thrombocytopenia, secondary to consumption. Resolved. 9. Chronic diastolic congestive heart failure. 10. Coronary artery disease. Remains stable. 11. COPD. 12. Fever. No obvious etiology; status post completion of IV vancomycin for several  days.  Discharge Condition: Improved.  Diet recommendation: Heart healthy.  Filed Weights   04/21/12 1727 04/27/12 0500 04/29/12 0428  Weight: 63.504 kg (140 lb) 63.5 kg (139 lb 15.9 oz) 64.1 kg (141 lb 5 oz)    History of present illness:  The patient is an 78 year old woman with a history significant for COPD, coronary artery disease, and chronic atrial fibrillation with chronic anticoagulation with Xarelto, who presented to the emergency department on 04/21/2012 after falling at home. Apparently, she got her feet tangled in an electrical cord and fell. In the emergency department, her blood pressure was low normal, but otherwise, she was afebrile and hemodynamically stable. X-ray of her sacrum/coccyx revealed no acute fracture. However, x-ray of her left hip revealed acute and mildly displaced fractures of both the superior and inferior pubic rami on the left. She was admitted for further evaluation and management.  Hospital Course:   Acute pubic rami fractures and retroperitoneal hemorrhage/acute blood loss anemia: The patient was started on supportive treatment. As needed analgesics were ordered for pain. The physical therapist was consulted. Because of her ongoing abdominal pain, a CT scan of her abdomen was ordered to rule out other etiologies that could not be explained by the fractures. The CT scan revealed  pelvic fractures with a large pelvic hematoma as well as retroperitoneal hemorrhage and hemorrhage into the anterior abdominal wall. Dr. Orvan Falconer discussed the findings with the trauma surgeon on call. Per their recommendation, supportive treatment was continued as there was no surgical indication. Her hemoglobin fell to 5 g.  She was transferred to the ICU. 4 units of packed red  blood cells were typed and crossed. Over a 36 hour period, all 4 units were transfused. IV vitamin K was given as well as 4 units of fresh frozen plasma. Her INR which had been initially elevated, normalized.   IV fluids were also given. Her hemoglobin and hematocrit were monitored closely. Diltiazem was discontinued due to hypotension. Physical therapy was temporary placed on hold. Her hemoglobin improved to a high of 9.7. However, since that time,  It has vacillated from  but it has remained stable ranging from 9.0 to 10.0 over the past week. Orthopedic surgeon Dr. Romeo Apple was consulted. His recommendations were to keep the patient at bed rest for a day and then increase activity with physical therapy as tolerated. Therefore weightbearing as tolerated was initiated. Also, Dr. Romeo Apple consulted general surgeon Dr. Lovell Sheehan for his recommendations regarding the traumatic hematomas. He also recommended supportive care as there was no surgical intervention needed. The patient began to weight-bear with physical therapy. She will continue rehabilitation at the [facility].  Chronic hypotension. The patient has chronically low blood pressures. This was confirmed by her daughter. She was hydrated appropriately during the hospitalization. Nevertheless, her blood pressures tended to stay in the mid to upper 80s to low 90s systolically. Diltiazem was eventually restarted for heart rate control, but was changed to every 8 hour dosing. Parameters were set to hold the diltiazem for systolic blood pressure of less than 87. Midodrine was started to help with blood pressure support while the patient was being treated for atrial fibrillation with diltiazem. Midodrine was eventually titrated up to 2.5 mg twice a day. Her systolic blood pressures have improved to the low 90s. As her blood pressure improves on midodrine, midodrine can be titrated downward accordingly.   Chronic atrial fibrillation. The patient's heart rate had been mostly controlled, but started to increase to the low 100s and into the 120s. As stated above, diltiazem was restarted, but with parameters. Digoxin was continued but the dose had to be increased to 0.25 mg  daily. Cardiology was consulted. Per their recommendation, amiodarone was started. However, the patient apparently could not tolerate amiodarone in the past, and therefore, it was discontinued. Diltiazem dosing was decreased from 30 mg every 6 hours to 15 mg every 8 hours. Midrin was titrated upward. Over the past 2-3 days, her heart rate and her blood pressure has improved progressively.  Xarelto was  indefinitely due to to her serious bleeding and risk of fall. Cardiologist, Dr. Dietrich Pates, stated that the Xarelto could be safely restarted in 4 days. I discussed this with the patient and her daughter. We were all in agreement that the patient would not be restarted Xarelto due to her life threatening bleed and fall risk, but rather aspirin would be restarted. I explained the slight risk increase of stroke off of anticoagulation. They understood. I recommended that further discussion about restarting anticoagulation should occur with her primary cardiologist, Dr. Diona Browner, at the next scheduled appointment. They were in agreement.  Aspirin is being restarted upon discharge.   Hyperlipidemia. Pravastatin was discontinued in favor of Lipitor per cardiologist, Dr. Dietrich Pates.   Fever. The patient developed a fever. For further evaluation, a number of studies were ordered. Her urinalysis was unremarkable. Her chest x-ray revealed no infiltrates. Bilateral lower extremity venous ultrasound revealed no DVT. CT scan of her abdomen and pelvis revealed some improvement in the hematoma, but no obvious signs of an infectious process. Blood cultures have been ordered, but the results are pending. Because  a PICC line was inserted during this hospitalization, she was started empirically on vancomycin. It was discontinued after several days, when her fever resolved and when all of her cultures remained negative. The PICC line will be discontinued upon discharge. The Foley catheter was discontinued last week.   Syncope. The  patient's syncope resulted from acute blood loss and hypotension. She had no neurological deficits. Following volume resuscitation, she had no complaints of presyncopal symptoms.  Chronic diastolic heart failure. The patient's chest x-ray on 04/24/2012 revealed vascular congestion. Her pro BNP did come elevated, but it was not completely suggestive of decompensated heart failure. A 2-D echocardiogram was ordered. It revealed an ejection fraction of 60%. Her troponin I was negative. Lasix however was restarted with caution.  COPD. The patient was maintained on bronchodilator therapy.  Loose stools. The patient had been constipated for days. She was given multiple laxatives and a suppository. This resulted in multiple loose bowel movements. Now, her bowel movements are more regular.     Procedures:  1) 2-D echocardiogram conclusions   - Left ventricle: The cavity size was normal. There was mild concentric hypertrophy. Systolic function was normal. The estimated ejection fraction was in the range of 60% to 65%. Wall motion was normal; there were no regional wall motion abnormalities. - Aortic valve: Mildly calcified annulus. Mildly calcified leaflets. - Mitral valve: Calcified annulus. - Left atrium: The atrium was moderately dilated. - Right atrium: The atrium was mildly to moderately dilated. - Atrial septum: No defect or patent foramen ovale was identified. Transthoracic echocardiography. M-mode, complete 2D, spectral Doppler, and color Doppler. Height: Height: 162.6cm. Height: 64in. Weight: Weight: 63.5kg. Weight: 139.7lb. Body mass index: BMI: 24kg/m^2. Body surface area: BSA: 1.54m^2. Patient status: Inpatient. Location: ICU/CCU  2) right upper extremity PICC line; status post removal.     Consultations:  Orthopedic surgeon Fuller Canada, M.D.  General surgeon, Franky Macho M.D.  Cardiologists, Dr. Diona Browner and Dr. Dietrich Pates   Discharge Exam: Filed Vitals:   05/04/12  0500 05/04/12 0540 05/04/12 0600 05/04/12 0800  BP: 97/57 110/60 114/56 92/66  Pulse:      Temp:    98.5 F (36.9 C)  TempSrc:    Oral  Resp: 13  18 19   Height:      Weight:      SpO2:        Exam:  General: Pleasant alert 78 year old Caucasian woman sitting up in bed, in no acute distress. Lungs: Clear to auscultation bilaterally Heart: Irregular, irregular Abdomen: Softer, significantly less tender, positive bowel sounds, no distention. Extremities: No pedal edema.   Discharge Instructions      Discharge Orders    Future Appointments: Provider: Department: Dept Phone: Center:   05/17/2012 1:40 PM Jonelle Sidle, MD [address] (near [place]) [phone] [email or web address]   07/05/2012 10:40 AM Jonelle Sidle, MD [facility] (near [phone] [facility]     Future Orders Please Complete By Expires   Diet - low sodium heart healthy      Diet - low sodium heart healthy      Increase activity slowly      Discharge instructions      Comments:   WEIGHTBEARING AS TOLERATED. FOLLOWING THE DISCONTINUATION OF IV VANCOMYCIN,   DISCONTINUE PICC LINE.   Increase activity slowly      Discharge instructions      Comments:   The patient will need a digoxin level checked in one week.       Medication  List     As of 05/04/2012  9:54 AM    STOP taking these medications         diltiazem 120 MG 24 hr capsule   Commonly known as: CARDIZEM CD      ergocalciferol 50000 UNITS capsule   Commonly known as: VITAMIN D2      FIBERCHOICE 2 G Chew   Generic drug: Inulin      isosorbide mononitrate 30 MG 24 hr tablet   Commonly known as: IMDUR      PRADAXA 150 MG Caps   Generic drug: dabigatran      pravastatin 40 MG tablet   Commonly known as: PRAVACHOL      XARELTO 20 MG Tabs   Generic drug: Rivaroxaban      TAKE these medications         acetaminophen 325 MG tablet   Commonly known as: TYLENOL   Take 650 mg by mouth every 6 (six)  hours as needed. For pain      ALIGN 4 MG Caps   Take 1 capsule by mouth daily.      aspirin 81 MG EC tablet   Take 1 tablet (81 mg total) by mouth daily. Swallow whole.      atorvastatin 40 MG tablet   Commonly known as: LIPITOR   Take 1 tablet (40 mg total) by mouth daily at 6 PM.      CALTRATE 600+D PLUS 600-400 MG-UNIT per tablet   Take 1 tablet by mouth 2 (two) times daily.      digoxin 0.25 MG tablet   Commonly known as: LANOXIN   Take 1 tablet (0.25 mg total) by mouth daily.      diltiazem 30 MG tablet   Commonly known as: CARDIZEM   Take 0.5 tablets (15 mg total) by mouth every 8 (eight) hours.      furosemide 20 MG tablet   Commonly known as: LASIX   Take 0.5 tablets (10 mg total) by mouth 2 (two) times daily.      hydrocortisone valerate cream 0.2 %   Commonly known as: WESTCORT   Apply 1 application topically as needed. For rash      ICAPS Caps   Take 1 capsule by mouth 2 (two) times daily.      levalbuterol 0.63 MG/3ML nebulizer solution   Commonly known as: XOPENEX   Take 3 mLs (0.63 mg total) by nebulization 2 (two) times daily.      levETIRAcetam 250 MG tablet   Commonly known as: KEPPRA   Takes one tablet in the morning and half a tablet at 3 pm.      midodrine 2.5 MG tablet   Commonly known as: PROAMATINE   Take 1 tablet (2.5 mg total) by mouth 2 (two) times daily with a meal. The dosing can be titrated down to once daily when her systolic blood pressure is consistently in the 100s.      omeprazole 20 MG tablet   Commonly known as: PRILOSEC OTC   Take 20 mg by mouth 3 (three) times a week. Takes on Monday, Wednesday, & Friday      oxyCODONE 5 MG immediate release tablet   Commonly known as: Oxy IR/ROXICODONE   Take 1 tablet (5 mg total) by mouth every 4 (four) hours as needed.      potassium chloride 10 MEQ CR tablet   Commonly known as: KLOR-CON   Take 10 mEq by mouth daily.  THERATEARS OP   Place 4 drops into both eyes 4 (four) times  daily.      VENTOLIN HFA 108 (90 BASE) MCG/ACT inhaler   Generic drug: albuterol   Inhale 2 puffs into the lungs 4 (four) times daily as needed. Shortness of breath         Follow-up Information    Follow up with Nona Dell, MD. On 05/17/2012. (1:40 pm with EKG)    Contact information:   [address] [place] [address] [phone]           The results of significant diagnostics from this hospitalization (including imaging, microbiology, ancillary and laboratory) are listed below for reference.    Significant Diagnostic Studies: Ct Abdomen Pelvis Wo Contrast  04/27/2012  *RADIOLOGY REPORT*  Clinical Data: Fever.  Follow up retroperitoneal hematoma  CT ABDOMEN AND PELVIS WITHOUT CONTRAST  Technique:  Multidetector CT imaging of the abdomen and pelvis was performed following the standard protocol without intravenous contrast.  Comparison: 04/22/2012  Findings: Lung bases:  Interval development of small bilateral pleural effusions.  Heart size is normal.  No pericardial effusion.  Abdomen/pelvis:  There is no focal liver abnormality.  Gallbladder appears normal.  No biliary dilatation.  The pancreas is unremarkable.  The spleen is negative.  Bilateral low attenuation nodules are identified within the adrenal glands.  Unchanged from previous studies.  Likely small adenomas. Mild perinephric fat stranding is noted bilaterally.  This is similar to previous exam.  No hydronephrosis or hydroureter. Right iliac fossa and right pelvic side wall hematoma measures 15 x 8.3 x 7.7 cm.  Previously this measured 15.7 x 9 x 8.7 cm.  This exerts significant mass effect upon the urinary bladder, uterus and adnexal structures. Stable to improved appearance of bilateral retroperitoneal hematomas.  There has been interval decrease in bilateral rectus muscle hematomas.  The stomach and the small bowel loops appear within normal limits. Normal appearance of the colon.  Bones/Musculoskeletal:  Again  identified are left inferior and superior pubic rami fractures as well as fractures involving the left pubic bone.  IMPRESSION:  1.  Stable to improved appearance of large pelvic hematoma, bilateral retroperitoneal hemorrhage, and rectus muscle hematomas. 2.  Stable pelvic fractures. 3.  Small bilateral pleural effusions are new from previous exam.   Original Report Authenticated By: Signa Kell, M.D.    Ct Abdomen Pelvis Wo Contrast  04/22/2012  *RADIOLOGY REPORT*  Clinical Data: Abdominal pain.  Hypotension.Pelvic fractures.  CT ABDOMEN AND PELVIS WITHOUT CONTRAST  Technique:  Multidetector CT imaging of the abdomen and pelvis was performed following the standard protocol without intravenous contrast.  Comparison: Radiographs dated at 04/21/2012 and CT scan dated 05/31/2011  Findings: The patient has a large right-sided pelvic hematoma as well as extensive retroperitoneal hemorrhage and hemorrhage extending into the anterior abdominal wall along both rectus muscles almost to the level of the umbilicus.  The right pelvic hematoma measures approximately 16 x 10 x 10 cm and has a mass effect upon the bladder.  Retroperitoneal hemorrhage extends in both pericolic gutters and in the posterior pararenal spaces.  There is a small amount of fluid over the right lobe of the liver which may be hemorrhage or ascites.  The liver, spleen, pancreas, adrenal glands, and kidneys demonstrate no significant abnormalities.  The bowel is normal except for sigmoid diverticulosis.  Again noted are the fractures of the left inferior superior pubic rami as well as both the left side of the  pubic bone.  The hemorrhage appears to originate from the left pubic body fracture.  IMPRESSION:  Pelvic fractures with a large pelvic hematoma as well as retroperitoneal hemorrhage and hemorrhage into the anterior abdominal wall and extending superiorly in the posterior pararenal spaces.  Critical Value/emergent results were called by telephone  at the time of interpretation on 04/22/2012 at 8:45 p.m. to Dr. Orvan Falconer, who verbally acknowledged these results.   Original Report Authenticated By: Francene Boyers, M.D.    Dg Lumbar Spine Complete  04/21/2012  *RADIOLOGY REPORT*  Clinical Data: Fall with sacral pain.  LUMBAR SPINE - COMPLETE 4+ VIEW  Comparison: None  Findings: Lumbar vertebral bodies show normal alignment and no evidence of acute fracture.  No acute sacral fractures are identified.  The sacroiliac joints are symmetric and normally aligned.  IMPRESSION: No acute fractures.   Original Report Authenticated By: Irish Lack, M.D.    Dg Sacrum/coccyx  04/21/2012  *RADIOLOGY REPORT*  Clinical Data: Fall with sacral pain.  SACRUM AND COCCYX - 2+ VIEW  Comparison: None.  Findings: No acute fractures are seen involving the sacrum or coccyx.  Bony evaluation is somewhat limited due to severe osteopenia.  No soft tissue abnormalities are identified.  IMPRESSION: No obvious sacral or coccygeal fractures.   Original Report Authenticated By: Irish Lack, M.D.    Dg Hip Complete Left  04/21/2012  *RADIOLOGY REPORT*  Clinical Data: Fall with left hip and groin pain.  LEFT HIP - COMPLETE 2+ VIEW  Comparison: 08/02/2009  Findings: Mildly displaced and comminuted fractures are seen involving both the superior and inferior pubic rami on the left. There is no evidence of diastasis of the pubic symphysis.  No other acute injuries are identified. There is moderate osteoarthritis with joint space narrowing of both hip joints.  Soft tissues are unremarkable.  IMPRESSION: Acute and mildly displaced fractures of both the superior and inferior pubic rami on the left.   Original Report Authenticated By: Irish Lack, M.D.    US Venous Img Lower Bilateral  04/27/2012  *RADIOLOGY REPORT*  Clinical Data: History of fever.  History of bilateral leg pain.  BILATERAL LOWER EXTREMITY VENOUS DUPLEX ULTRASOUND  Technique:  Gray-scale sonography with graded  compression, as well as color Doppler and duplex ultrasound, were performed to evaluate the deep venous system of both lower extremities from the level of the common femoral vein through the popliteal and proximal calf veins.  Spectral Doppler was utilized to evaluate flow at rest and with distal augmentation maneuvers.  Comparison:  None.  Findings:  Normal compressibility of bilateral common femoral, superficial femoral, and popliteal veins is demonstrated, as well as the visualized proximal calf veins.  No filling defects to suggest DVT on grayscale or color Doppler imaging.  Doppler waveforms show normal direction of venous flow, normal respiratory phasicity and response to augmentation.  IMPRESSION: No evidence of deep venous thrombosis in either leg.   Original Report Authenticated By: Onalee Hua Call    Dg Chest Port 1 View  04/27/2012  *RADIOLOGY REPORT*  Clinical Data: Fever, history heart failure, COPD, asthma, coronary artery disease  PORTABLE CHEST - 1 VIEW  Comparison: Portable exam 0620 hours compared to 04/24/2012  Findings: Right arm PICC line stable, tip projecting over cavoatrial junction. Enlargement of cardiac silhouette. Calcified tortuous aorta. Mediastinal contours and pulmonary vascularity otherwise normal. No pulmonary infiltrate, pleural effusion or pneumothorax. Bilateral cervical ribs versus hypoplastic 1st ribs. Underlying emphysematous and bronchitic changes.  IMPRESSION: Enlargement of cardiac silhouette. Emphysematous bronchitic changes.  No acute abnormalities. Improvement in aeration at right base since previous exam.   Original Report Authenticated By: Ulyses Southward, M.D.    Dg Chest Port 1 View  04/24/2012  *RADIOLOGY REPORT*  Clinical Data: Shortness of breath, post blood transfusion  PORTABLE CHEST - 1 VIEW  Comparison: Portable exam 1017 hours compared to 04/23/2012  Findings: Right arm PICC line stable tip projecting over cavoatrial junction. Enlargement of cardiac silhouette  with pulmonary vascular congestion. Calcified tortuous aorta. Chronic peribronchial thickening. Questionable right basilar infiltrate. Remaining lungs clear. No pleural effusion or pneumothorax. Coronary arterial stent noted. Bones demineralized.  IMPRESSION: Enlargement of cardiac silhouette with pulmonary vascular congestion. Chronic bronchitic changes with questionable right basilar infiltrate.   Original Report Authenticated By: Ulyses Southward, M.D.    Dg Chest Port 1 View  04/23/2012  *RADIOLOGY REPORT*  Clinical Data: Line placement  PORTABLE CHEST - 1 VIEW  Comparison: Earlier today at 5:10 hours  Findings: Right-sided PICC line which is difficult to follow centrally.  Followed to at least the level of the low SVC. No pneumothorax.  Borderline cardiomegaly.  No pleural fluid. Clear lungs.  IMPRESSION: Right-sided PICC line which is difficult to follow centrally. Followed to at least the level of the low SVC.  Consider retraction 2.6 cm with repeat radiograph.   Original Report Authenticated By: Jeronimo Greaves, M.D.    Dg Chest Port 1 View  04/23/2012  *RADIOLOGY REPORT*  Clinical Data: Tachycardia.  PORTABLE CHEST - 1 VIEW  Comparison: Chest radiograph performed 04/22/2012  Findings: The lungs are well-aerated and clear.  There is no evidence of focal opacification, pleural effusion or pneumothorax.  The cardiomediastinal silhouette is within normal limits.  Mild apparent right hilar prominence is stable from prior studies.  No acute osseous abnormalities are seen.  IMPRESSION: No acute cardiopulmonary process seen.   Original Report Authenticated By: Tonia Ghent, M.D.    Dg Chest Port 1 View  04/22/2012  *RADIOLOGY REPORT*  Clinical Data: Hypotension  PORTABLE CHEST - 1 VIEW  Comparison: 06/12/2011  Findings: Lungs are clear.  Negative for pneumonia.  Negative for heart failure or effusion.  Heart size is normal.  IMPRESSION: No acute abnormality.   Original Report Authenticated By: Janeece Riggers, M.D.     Dg Chest Port 1v Same Day  04/23/2012  *RADIOLOGY REPORT*  Clinical Data: Repositioning of PICC line.  PORTABLE CHEST - 1 VIEW SAME DAY  Comparison: Earlier today at 0842 hours.  Findings: 0923 hours.  Right-sided PICC line terminates at the low SVC. No pneumothorax.  Borderline cardiomegaly. No pleural fluid.  Clear lungs.  IMPRESSION: Right-sided PICC line terminating at the low SVC.  No pneumothorax or other acute cardiopulmonary process.   Original Report Authenticated By: Jeronimo Greaves, M.D.     Microbiology: Recent Results (from the past 240 hour(s))  CULTURE, BLOOD (ROUTINE X 2)     Status: Normal   Collection Time   04/27/12  4:45 AM      Component Value Range Status Comment   Specimen Description BLOOD PICC LINE DRAWN BY RN   Final    Special Requests BOTTLES DRAWN AEROBIC AND ANAEROBIC 6CC   Final    Culture NO GROWTH 5 DAYS   Final    Report Status 05/02/2012 FINAL   Final   CULTURE, BLOOD (ROUTINE X 2)     Status: Normal   Collection Time   04/27/12  4:59 AM      Component Value Range Status Comment   Specimen  Description BLOOD LEFT HAND   Final    Special Requests BOTTLES DRAWN AEROBIC AND ANAEROBIC [facility]   Final    Culture NO GROWTH 5 DAYS   Final    Report Status 05/02/2012 FINAL   Final      Labs: Basic Metabolic Panel:  Lab 05/03/12 1610 05/01/12 0601 04/30/12 0416 04/29/12 0643 04/28/12 0450  NA 138 136 132* 131* 135  K 3.5 3.6 3.6 3.7 5.0  CL 104 101 97 97 105  CO2 27 29 28 27 24   GLUCOSE 88 100* 96 88 386*  BUN 9 13 14 12 12   CREATININE 0.48* 0.59 0.58 0.54 0.53  CALCIUM 8.5 8.7 9.0 8.7 7.8*  MG -- -- -- -- --  PHOS -- -- -- -- --   Liver Function Tests: No results found for this basename: AST:5,ALT:5,ALKPHOS:5,BILITOT:5,PROT:5,ALBUMIN:5 in the last 168 hours No results found for this basename: LIPASE:5,AMYLASE:5 in the last 168 hours No results found for this basename: AMMONIA:5 in the last 168 hours CBC:  Lab 05/04/12 0508 05/03/12 0438 05/02/12 0419  05/01/12 0601 04/30/12 0417  WBC 6.9 6.0 5.9 5.6 8.2  NEUTROABS -- -- -- -- --  HGB 10.2* 9.4* 9.1* 9.0* 10.0*  HCT 32.5* 29.9* 28.3* 28.1* 30.8*  MCV 93.7 92.6 92.5 91.5 90.6  PLT 379 341 299 278 331   Cardiac Enzymes: No results found for this basename: CKTOTAL:5,CKMB:5,CKMBINDEX:5,TROPONINI:5 in the last 168 hours BNP: BNP (last 3 results)  Basename 04/25/12 0426 04/23/12 0326 06/12/11 0311  PROBNP 1721.0* 190.9 474.1*   CBG:  Lab 04/28/12 1002  GLUCAP 124*       Signed:  Keval Nam  [facility] 05/04/2012, 9:54 AM

## 2012-05-04 NOTE — Progress Notes (Signed)
Pt to be transferred to [facility] per MD order. Discharge instructions and medication prescriptions gone over with pt and family member using teach back method. Pt transferred to [facility] via wheelchair. Family at bedside.

## 2012-05-15 ENCOUNTER — Encounter: Payer: Self-pay | Admitting: Cardiology

## 2012-05-17 ENCOUNTER — Encounter: Payer: Self-pay | Admitting: Cardiology

## 2012-05-17 ENCOUNTER — Ambulatory Visit (INDEPENDENT_AMBULATORY_CARE_PROVIDER_SITE_OTHER): Payer: Medicare Other | Admitting: Cardiology

## 2012-05-17 VITALS — BP 105/68 | HR 75 | Ht 63.0 in | Wt 138.0 lb

## 2012-05-17 DIAGNOSIS — I959 Hypotension, unspecified: Secondary | ICD-10-CM

## 2012-05-17 DIAGNOSIS — I4891 Unspecified atrial fibrillation: Secondary | ICD-10-CM

## 2012-05-17 DIAGNOSIS — I2510 Atherosclerotic heart disease of native coronary artery without angina pectoris: Secondary | ICD-10-CM

## 2012-05-17 DIAGNOSIS — E782 Mixed hyperlipidemia: Secondary | ICD-10-CM

## 2012-05-17 DIAGNOSIS — I4821 Permanent atrial fibrillation: Secondary | ICD-10-CM

## 2012-05-17 NOTE — Assessment & Plan Note (Addendum)
Blood pressure stable today. She remains on ProAmatine for now.

## 2012-05-17 NOTE — Assessment & Plan Note (Signed)
Continue current use of short acting Cardizem with adequate rate control. May be able to simplify to single day dose as she improves in rehabilitation and her blood pressure further stabilizes. She is also on digoxin. Continue aspirin, no plan to resume anticoagulation at this point.

## 2012-05-17 NOTE — Patient Instructions (Signed)
Continue all current medications. See copy of notes attached.

## 2012-05-17 NOTE — Progress Notes (Signed)
Clinical Summary Ms. Delio is an 78 y.o.female presenting for office followup visit. She is a former patient of Dr. Andee Lineman, last seen in March of this year. Records reviewed including recent hospitalization at [facility] after a fall. Etiology of her fall was tripping, she had no dizziness or syncope. She had resulting left superior and inferior pubic rami fractures, also traumatic retroperitoneal hemorrhage and pelvic hematoma also involving rectus muscles. She had been anticoagulated with Xarelto, and this was discontinued during her hospitalization. She was otherwise treated with medical therapy for rate control of permanent atrial fibrillation, complicated somewhat by relatively chronic hypotension.  Recent lab work reviewed finding hemoglobin 10.2, platelets 379, potassium 3.5, BUN 9, creatinine 0.48.  She is now in the [facility], doing inpatient rehabilitation. States that she feels better. Her blood pressure looks better today, and her heart rate is well controlled. I reviewed her outpatient records. We discussed anticoagulation, and do not plan to resume anticoagulation at this point. She continues on aspirin. We have discussed the relative stroke risk with aspirin versus anticoagulation.   Allergies  Allergen Reactions  . Omnipaque (Iohexol) Shortness Of Breath and Other (See Comments)    Short of breath with chest tightness after IV injection in CT, pt was fine when she left department but developed symptoms in parking lot and went to emergency department.  . Carbamazepine     REACTION: toxemia  . Tramadol Other (See Comments)    Unknown     Current Outpatient Prescriptions on File Prior to Visit  Medication Sig Dispense Refill  . acetaminophen (TYLENOL) 325 MG tablet Take 650 mg by mouth every 6 (six) hours as needed. For pain       . albuterol (VENTOLIN HFA) 108 (90 BASE) MCG/ACT inhaler Inhale 2 puffs into the lungs 4 (four) times daily as needed. Shortness of breath      .  aspirin (ASPIRIN EC) 81 MG EC tablet Take 1 tablet (81 mg total) by mouth daily. Swallow whole.      Marland Kitchen atorvastatin (LIPITOR) 40 MG tablet Take 1 tablet (40 mg total) by mouth daily at 6 PM.      . Calcium Carbonate-Vit D-Min (CALTRATE 600+D PLUS) 600-400 MG-UNIT per tablet Take 1 tablet by mouth 2 (two) times daily.        . Carboxymethylcellulose Sodium (THERATEARS OP) Place 4 drops into both eyes 4 (four) times daily.        . digoxin (LANOXIN) 0.25 MG tablet Take 1 tablet (0.25 mg total) by mouth daily.      Marland Kitchen diltiazem (CARDIZEM) 30 MG tablet Take 0.5 tablets (15 mg total) by mouth every 8 (eight) hours.      . furosemide (LASIX) 20 MG tablet Take 0.5 tablets (10 mg total) by mouth 2 (two) times daily.      Marland Kitchen levETIRAcetam (KEPPRA) 250 MG tablet Takes one tablet in the morning and half a tablet at 3 pm.      . midodrine (PROAMATINE) 2.5 MG tablet Take 1 tablet (2.5 mg total) by mouth 2 (two) times daily with a meal. The dosing can be titrated down to once daily when her systolic blood pressure is consistently in the 100s.      . Multiple Vitamins-Minerals (ICAPS) CAPS Take 1 capsule by mouth 2 (two) times daily.      Marland Kitchen omeprazole (PRILOSEC OTC) 20 MG tablet Take 20 mg by mouth 3 (three) times a week. Takes on Monday, Wednesday, & Friday      .  oxyCODONE (OXY IR/ROXICODONE) 5 MG immediate release tablet Take 1 tablet (5 mg total) by mouth every 4 (four) hours as needed.  30 tablet  0  . potassium chloride (KLOR-CON) 10 MEQ CR tablet Take 10 mEq by mouth daily.        . Probiotic Product (ALIGN) 4 MG CAPS Take 1 capsule by mouth daily.          Past Medical History  Diagnosis Date  . Chronic diastolic heart failure   . Atrial fibrillation     Permanent  . COPD (chronic obstructive pulmonary disease)   . Drug intolerance     Flecacinide and Amiodarone   . Asthma   . Umbilical hernia   . Coronary atherosclerosis of native coronary artery     BMS circumflex 2009  . Chronic anticoagulation      Xarelto discontinued after fall  . Seizures     Remote history of over 20 years ago  . Fracture of multiple pubic rami 12/13    Left superior and inferior - following fall  . Retroperitoneal hemorrhage 12/13    Following fall  . Hypotension   . Mixed hyperlipidemia     Past Surgical History  Procedure Date  . Radiofrequency catheter ablation     Failed  . Right carpel tunnel release   . Cataract extraction   . Tonsillectomy     Social History Ms. Weinel reports that she quit smoking about 22 years ago. Her smoking use included Cigarettes. She has a 32 pack-year smoking history. She has never used smokeless tobacco. Ms. Beckstrom reports that she does not drink alcohol.  Review of Systems No sense of palpitations, appetite stable. No reported bleeding problems. Has persistent leg weakness although states that it is improving slowly. Otherwise negative.  Physical Examination Filed Vitals:   05/17/12 1334  BP: 105/68  Pulse: 75   Filed Weights   05/17/12 1334  Weight: 138 lb (62.596 kg)   No acute distress, seated in wheelchair. HEENT: Conjunctiva and lids normal, oropharynx clear. Neck: Supple, no elevated JVP or carotid bruits, no thyromegaly. Lungs: Clear to auscultation, nonlabored breathing at rest. Cardiac: Irregularly irregular, no S3, soft systolic murmur, no pericardial rub. Abdomen: Soft, nontender, bowel sounds present, no guarding or rebound. Extremities: No pitting edema, distal pulses 2+. Skin: Warm and dry. Musculoskeletal: Mild kyphosis. Neuropsychiatric: Alert and oriented x3, affect grossly appropriate.   Problem List and Plan   Coronary atherosclerosis of native coronary artery Symptomatically stable on present medical regimen. No changes made.  Permanent atrial fibrillation Continue current use of short acting Cardizem with adequate rate control. May be able to simplify to single day dose as she improves in rehabilitation and her blood  pressure further stabilizes. She is also on digoxin. Continue aspirin, no plan to resume anticoagulation at this point.  Hypotension Blood pressure stable today. She remains on ProAmatine for now.  Mixed hyperlipidemia Continue Lipitor.    Jonelle Sidle, M.D., F.A.C.C.

## 2012-05-17 NOTE — Assessment & Plan Note (Signed)
Symptomatically stable on present medical regimen. No changes made.

## 2012-05-17 NOTE — Assessment & Plan Note (Signed)
-  Continue Lipitor °

## 2012-05-22 ENCOUNTER — Other Ambulatory Visit: Payer: Self-pay | Admitting: *Deleted

## 2012-05-22 ENCOUNTER — Ambulatory Visit: Payer: Medicare Other | Admitting: Cardiology

## 2012-05-22 DIAGNOSIS — I5032 Chronic diastolic (congestive) heart failure: Secondary | ICD-10-CM

## 2012-05-22 DIAGNOSIS — I959 Hypotension, unspecified: Secondary | ICD-10-CM

## 2012-05-22 DIAGNOSIS — Z79899 Other long term (current) drug therapy: Secondary | ICD-10-CM

## 2012-07-05 ENCOUNTER — Ambulatory Visit: Payer: Medicare Other | Admitting: Cardiology

## 2012-07-07 ENCOUNTER — Other Ambulatory Visit: Payer: Self-pay

## 2012-07-13 ENCOUNTER — Ambulatory Visit (INDEPENDENT_AMBULATORY_CARE_PROVIDER_SITE_OTHER): Payer: Medicare Other | Admitting: Physician Assistant

## 2012-07-13 ENCOUNTER — Encounter: Payer: Self-pay | Admitting: Physician Assistant

## 2012-07-13 VITALS — BP 96/60 | HR 91 | Ht 62.0 in | Wt 133.0 lb

## 2012-07-13 DIAGNOSIS — I4891 Unspecified atrial fibrillation: Secondary | ICD-10-CM

## 2012-07-13 DIAGNOSIS — I959 Hypotension, unspecified: Secondary | ICD-10-CM

## 2012-07-13 DIAGNOSIS — I2510 Atherosclerotic heart disease of native coronary artery without angina pectoris: Secondary | ICD-10-CM

## 2012-07-13 DIAGNOSIS — I4821 Permanent atrial fibrillation: Secondary | ICD-10-CM

## 2012-07-13 NOTE — Assessment & Plan Note (Signed)
Adequately controlled on low-dose diltiazem, as well as digoxin. Patient is to remain on low-dose ASA, pending clearance to resume Xarelto.

## 2012-07-13 NOTE — Assessment & Plan Note (Addendum)
We had an extensive discussion regarding her hypotension, which she reports is long-standing. I considered cutting back on diltiazem; however, she is on a very low dose and requires this for rate control of her AF. Also, she denies any associated symptoms of dizziness or near syncope, including postural symptoms. Therefore, I advised her to continue on this regimen, but to contact us if she were to develop the aforementioned symptoms. Of note, this is also being treated with Midodrine.

## 2012-07-13 NOTE — Patient Instructions (Signed)
Continue all current medications. Follow up in  4 months  

## 2012-07-13 NOTE — Progress Notes (Signed)
Primary Cardiologist: Simona Huh, MD   HPI: Schedule followup.  Since last seen here in office in late December, by Dr. Diona Browner, patient reports improvement in her overall exercise tolerance. She was just recently discharged from inpatient rehabilitation at [facility]. She was recovering from multiple pubic fractures, resulting from a fall in November of last year (no associated syncope). She also developed a retroperitoneal hemorrhage and a pelvic hematoma, and was taken off Xarelto. She remains on low-dose ASA, as she was at time of last OV.  She denies interim development of CP, dyspnea, or tachycardia palpitations. She has not had recurrent falls. She denies presyncope/syncope.  Allergies  Allergen Reactions  . Omnipaque (Iohexol) Shortness Of Breath and Other (See Comments)    Short of breath with chest tightness after IV injection in CT, pt was fine when she left department but developed symptoms in parking lot and went to emergency department.  . Carbamazepine     REACTION: toxemia  . Tramadol Other (See Comments)    Unknown     Current Outpatient Prescriptions  Medication Sig Dispense Refill  . acetaminophen (TYLENOL) 325 MG tablet Take 650 mg by mouth every 6 (six) hours as needed. For pain       . albuterol (PROVENTIL) (2.5 MG/3ML) 0.083% nebulizer solution Take 2.5 mg by nebulization 2 (two) times daily as needed.      Marland Kitchen albuterol (VENTOLIN HFA) 108 (90 BASE) MCG/ACT inhaler Inhale 2 puffs into the lungs 4 (four) times daily as needed. Shortness of breath      . aspirin (ASPIRIN EC) 81 MG EC tablet Take 1 tablet (81 mg total) by mouth daily. Swallow whole.      Marland Kitchen atorvastatin (LIPITOR) 40 MG tablet Take 1 tablet (40 mg total) by mouth daily at 6 PM.      . Calcium Carbonate-Vit D-Min (CALTRATE 600+D PLUS) 600-400 MG-UNIT per tablet Take 1 tablet by mouth 2 (two) times daily.        . Carboxymethylcellulose Sodium (THERATEARS OP) Place 4 drops into both eyes 4 (four) times daily.         . digoxin (LANOXIN) 0.25 MG tablet Take 1 tablet (0.25 mg total) by mouth daily.      Marland Kitchen diltiazem (CARDIZEM) 30 MG tablet Take 0.5 tablets (15 mg total) by mouth every 8 (eight) hours.      . furosemide (LASIX) 20 MG tablet Take 0.5 tablets (10 mg total) by mouth 2 (two) times daily.      Marland Kitchen levETIRAcetam (KEPPRA) 250 MG tablet Takes one tablet in the morning and half a tablet at 3 pm.      . midodrine (PROAMATINE) 2.5 MG tablet Take 1 tablet (2.5 mg total) by mouth 2 (two) times daily with a meal. The dosing can be titrated down to once daily when her systolic blood pressure is consistently in the 100s.      . Multiple Vitamins-Minerals (ICAPS) CAPS Take 1 capsule by mouth 2 (two) times daily.      Marland Kitchen omeprazole (PRILOSEC OTC) 20 MG tablet Take 20 mg by mouth 3 (three) times a week. Takes on Monday, Wednesday, & Friday      . oxyCODONE (OXY IR/ROXICODONE) 5 MG immediate release tablet Take 1 tablet (5 mg total) by mouth every 4 (four) hours as needed.  30 tablet  0  . polyethylene glycol (MIRALAX / GLYCOLAX) packet Take 17 g by mouth as needed.      . potassium chloride (KLOR-CON) 10 MEQ CR  tablet Take 10 mEq by mouth daily.        . Probiotic Product (ALIGN) 4 MG CAPS Take 1 capsule by mouth daily.         No current facility-administered medications for this visit.    Past Medical History  Diagnosis Date  . Chronic diastolic heart failure   . Atrial fibrillation     Permanent  . COPD (chronic obstructive pulmonary disease)   . Drug intolerance     Flecacinide and Amiodarone   . Asthma   . Umbilical hernia   . Coronary atherosclerosis of native coronary artery     BMS circumflex 2009  . Chronic anticoagulation     Xarelto discontinued after fall  . Seizures     Remote history of over 20 years ago  . Fracture of multiple pubic rami 12/13    Left superior and inferior - following fall  . Retroperitoneal hemorrhage 12/13    Following fall  . Hypotension   . Mixed hyperlipidemia      Past Surgical History  Procedure Laterality Date  . Radiofrequency catheter ablation      Failed  . Right carpel tunnel release    . Cataract extraction    . Tonsillectomy      History   Social History  . Marital Status: Widowed    Spouse Name: N/A    Number of Children: N/A  . Years of Education: N/A   Occupational History  . Not on file.   Social History Main Topics  . Smoking status: Former Smoker -- 0.80 packs/day for 40 years    Types: Cigarettes    Quit date: 05/23/1990  . Smokeless tobacco: Never Used  . Alcohol Use: No  . Drug Use: No  . Sexually Active: Not on file   Other Topics Concern  . Not on file   Social History Narrative   Widowed.     Family History  Problem Relation Age of Onset  . Coronary artery disease Mother     ROS: no nausea, vomiting; no fever, chills; no melena, hematochezia; no claudication  PHYSICAL EXAM: BP 96/60  Pulse 91  Ht 5\' 2"  (1.575 m)  Wt 133 lb (60.328 kg)  BMI 24.32 kg/m2 GENERAL: 79 year old female, frail appearing; NAD HEENT: NCAT, PERRLA, EOMI; sclera clear; no xanthelasma NECK: palpable bilateral carotid pulses, no bruits; no JVD; no TM LUNGS: CTA bilaterally CARDIAC: Irregularly irregular (S1, S2); no significant murmurs; no rubs or gallops ABDOMEN: soft, non-tender; intact BS EXTREMETIES: Trace-1+ bilateral, nonpitting peripheral edema SKIN: warm/dry; no obvious rash/lesions MUSCULOSKELETAL: no joint deformity NEURO: no focal deficit; NL affect   EKG:    ASSESSMENT & PLAN:  Permanent atrial fibrillation Adequately controlled on low-dose diltiazem, as well as digoxin. Patient is to remain on low-dose ASA, pending clearance to resume Xarelto.  Hypotension We had an extensive discussion regarding her hypotension, which she reports is long-standing. I considered cutting back on diltiazem; however, she is on a very low dose and requires this for rate control of her AF. Also, she denies any associated  symptoms of dizziness or near syncope, including postural symptoms. Therefore, I advised her to continue on this regimen, but to contact us if she were to develop the aforementioned symptoms. Of note, this is also being treated with Midodrine.  Coronary atherosclerosis of native coronary artery Quiescent on current medication regimen    Gene Serai Tukes, PAC

## 2012-07-13 NOTE — Assessment & Plan Note (Signed)
Quiescent on current medication regimen. 

## 2012-08-24 ENCOUNTER — Ambulatory Visit: Payer: Self-pay | Admitting: Cardiology

## 2012-08-27 ENCOUNTER — Other Ambulatory Visit (HOSPITAL_BASED_OUTPATIENT_CLINIC_OR_DEPARTMENT_OTHER): Payer: Self-pay | Admitting: Internal Medicine

## 2012-09-24 ENCOUNTER — Other Ambulatory Visit (HOSPITAL_BASED_OUTPATIENT_CLINIC_OR_DEPARTMENT_OTHER): Payer: Self-pay | Admitting: Internal Medicine

## 2012-09-25 ENCOUNTER — Other Ambulatory Visit (HOSPITAL_BASED_OUTPATIENT_CLINIC_OR_DEPARTMENT_OTHER): Payer: Self-pay | Admitting: Internal Medicine

## 2012-11-13 ENCOUNTER — Ambulatory Visit (INDEPENDENT_AMBULATORY_CARE_PROVIDER_SITE_OTHER): Payer: Medicare Other | Admitting: Cardiology

## 2012-11-13 ENCOUNTER — Encounter: Payer: Self-pay | Admitting: Cardiology

## 2012-11-13 VITALS — BP 94/76 | HR 80 | Ht 62.0 in | Wt 132.0 lb

## 2012-11-13 DIAGNOSIS — I4821 Permanent atrial fibrillation: Secondary | ICD-10-CM

## 2012-11-13 DIAGNOSIS — I4891 Unspecified atrial fibrillation: Secondary | ICD-10-CM

## 2012-11-13 DIAGNOSIS — I959 Hypotension, unspecified: Secondary | ICD-10-CM

## 2012-11-13 DIAGNOSIS — I2510 Atherosclerotic heart disease of native coronary artery without angina pectoris: Secondary | ICD-10-CM

## 2012-11-13 NOTE — Progress Notes (Signed)
Clinical Summary Deborah [name] is an 79 y.o.female was recently seen by Mr. Serpe PAC in February. She has been home after extensive rehabilitation with previous fall, doing well over the last few months. She does have to be careful ambulating, uses a cane, has chronic hypotension that is treated with ProAmatine. She denies any recent falls.  Today we reviewed her overall situation. We discussed the risks and benefits of resuming anticoagulation. At this point she indicates that she would prefer to stay on aspirin  She reports no regular sense of palpitations on current rate control regimen.  Otherwise denies any angina symptoms. She is on Lipitor, lipids followed by Dr. Sherril Croon.   Allergies  Allergen Reactions  . Omnipaque (Iohexol) Shortness Of Breath and Other (See Comments)    Short of breath with chest tightness after IV injection in CT, pt was fine when she left department but developed symptoms in parking lot and went to emergency department.  . Carbamazepine     REACTION: toxemia  . Tramadol Other (See Comments)    Unknown     Current Outpatient Prescriptions  Medication Sig Dispense Refill  . acetaminophen (TYLENOL) 325 MG tablet Take 650 mg by mouth every 6 (six) hours as needed. For pain       . albuterol (PROVENTIL) (2.5 MG/3ML) 0.083% nebulizer solution Take 2.5 mg by nebulization 2 (two) times daily as needed.      Marland Kitchen albuterol (VENTOLIN HFA) 108 (90 BASE) MCG/ACT inhaler Inhale 2 puffs into the lungs 4 (four) times daily as needed. Shortness of breath      . aspirin (ASPIRIN EC) 81 MG EC tablet Take 1 tablet (81 mg total) by mouth daily. Swallow whole.      Marland Kitchen atorvastatin (LIPITOR) 40 MG tablet Take 1 tablet (40 mg total) by mouth daily at 6 PM.      . Calcium Carbonate-Vit D-Min (CALTRATE 600+D PLUS) 600-400 MG-UNIT per tablet Take 1 tablet by mouth 2 (two) times daily.        . Carboxymethylcellulose Sodium (THERATEARS OP) Place 4 drops into both eyes 4 (four) times daily.         . digoxin (LANOXIN) 0.25 MG tablet Take 1 tablet (0.25 mg total) by mouth daily.      Marland Kitchen diltiazem (CARDIZEM) 30 MG tablet TAKE 1/2 TABLET BY MOUTH EVERY 8 HOURS.  45 tablet  3  . esomeprazole (NEXIUM) 40 MG capsule Take 40 mg by mouth 3 (three) times a week.      . furosemide (LASIX) 20 MG tablet Take 0.5 tablets (10 mg total) by mouth 2 (two) times daily.      Marland Kitchen levETIRAcetam (KEPPRA) 250 MG tablet Takes one tablet in the morning and half a tablet at 3 pm.      . midodrine (PROAMATINE) 2.5 MG tablet Take 1 tablet (2.5 mg total) by mouth 2 (two) times daily with a meal. The dosing can be titrated down to once daily when her systolic blood pressure is consistently in the 100s.      . Multiple Vitamins-Minerals (ICAPS) CAPS Take 1 capsule by mouth 2 (two) times daily.      . nitroGLYCERIN (NITROSTAT) 0.4 MG SL tablet Place 0.4 mg under the tongue every 5 (five) minutes as needed for chest pain.      Marland Kitchen omeprazole (PRILOSEC OTC) 20 MG tablet Take 20 mg by mouth 3 (three) times a week. Takes on Monday, Wednesday, & Friday      . oxyCODONE (  OXY IR/ROXICODONE) 5 MG immediate release tablet Take 1 tablet (5 mg total) by mouth every 4 (four) hours as needed.  30 tablet  0  . potassium chloride (KLOR-CON) 10 MEQ CR tablet Take 10 mEq by mouth daily.         No current facility-administered medications for this visit.    Past Medical History  Diagnosis Date  . Chronic diastolic heart failure   . Atrial fibrillation     Permanent  . COPD (chronic obstructive pulmonary disease)   . Drug intolerance     Flecacinide and Amiodarone   . Asthma   . Umbilical hernia   . Coronary atherosclerosis of native coronary artery     BMS circumflex 2009  . Chronic anticoagulation     Xarelto discontinued after fall  . Seizures     Remote history of over 20 years ago  . Fracture of multiple pubic rami 12/13    Left superior and inferior - following fall  . Retroperitoneal hemorrhage 12/13    Following fall    . Hypotension   . Mixed hyperlipidemia     Social History Deborah [name] reports that she quit smoking about 22 years ago. Her smoking use included Cigarettes. She has a 32 pack-year smoking history. She has never used smokeless tobacco. Deborah [name] reports that she does not drink alcohol.  Review of Systems Does have some trouble with her memory. Otherwise as outlined above.  Physical Examination Filed Vitals:   11/13/12 1059  BP: 94/76  Pulse: 80   Filed Weights   11/13/12 1059  Weight: 132 lb (59.875 kg)    Comfortable at rest.  HEENT: Conjunctiva and lids normal, oropharynx clear.  Neck: Supple, no elevated JVP or carotid bruits, no thyromegaly.  Lungs: Clear to auscultation, nonlabored breathing at rest.  Cardiac: Irregularly irregular, no S3, soft systolic murmur, no pericardial rub.  Abdomen: Soft, nontender, bowel sounds present, no guarding or rebound.  Extremities: No pitting edema, distal pulses 2+.  Skin: Warm and dry.  Musculoskeletal: Mild kyphosis.    Problem List and Plan   Permanent atrial fibrillation After discussion we do not plan to resume anticoagulation. She will continue on aspirin and other rate control regimen. Clinically stable at this point.  Coronary atherosclerosis of native coronary artery No active angina symptoms, continue observation.  Hypotension Chronic, managed with ProAmatine. No changes made today.    Jonelle Sidle, M.D., F.A.C.C.

## 2012-11-13 NOTE — Assessment & Plan Note (Signed)
After discussion we do not plan to resume anticoagulation. She will continue on aspirin and other rate control regimen. Clinically stable at this point.

## 2012-11-13 NOTE — Assessment & Plan Note (Signed)
No active angina symptoms, continue observation.

## 2012-11-13 NOTE — Assessment & Plan Note (Signed)
Chronic, managed with ProAmatine. No changes made today.

## 2012-11-13 NOTE — Patient Instructions (Addendum)

## 2012-12-25 ENCOUNTER — Other Ambulatory Visit (HOSPITAL_BASED_OUTPATIENT_CLINIC_OR_DEPARTMENT_OTHER): Payer: Self-pay | Admitting: Internal Medicine

## 2012-12-26 ENCOUNTER — Other Ambulatory Visit: Payer: Self-pay

## 2013-03-28 ENCOUNTER — Other Ambulatory Visit: Payer: Self-pay

## 2013-09-24 IMAGING — CT CT ABD-PELV W/O CM
2 of 4 series · 17 of 46 positions shown, 19 images · non-contrast
Comparison: 04/22/2012

CLINICAL DATA: Fever.  Follow up retroperitoneal hematoma

CT ABDOMEN AND PELVIS WITHOUT CONTRAST
TECHNIQUE: Multidetector CT imaging of the abdomen and pelvis was
performed following the standard protocol without intravenous
contrast.

[Series 2: abdomen/pelvis w/o contrast · axial · non-contrast · 0.75mm/px · z∈[+580,+940]mm · 14 of 84 slices shown, 16 images]
[im 6/84  soft-tissue]
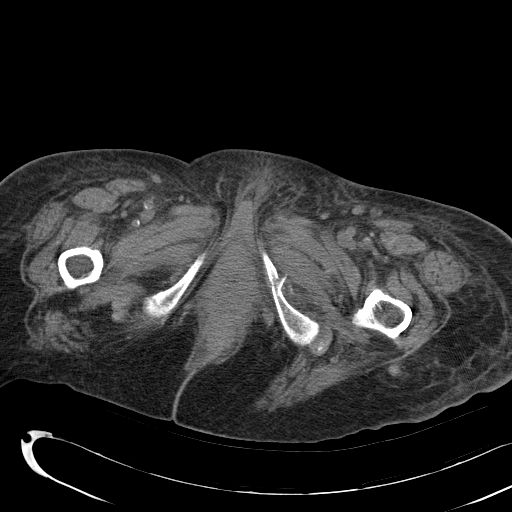
[im 6/84  bone]
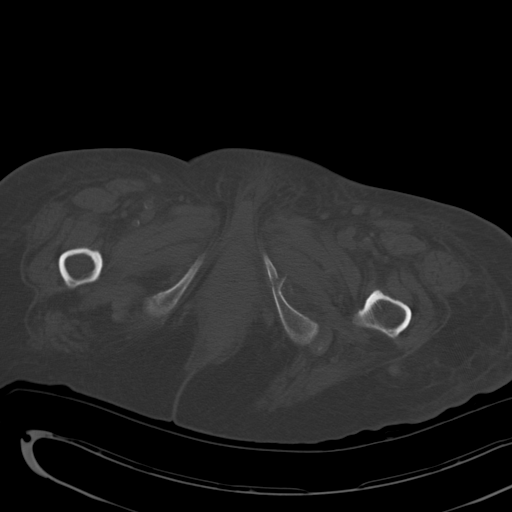
[im 12/84  soft-tissue]
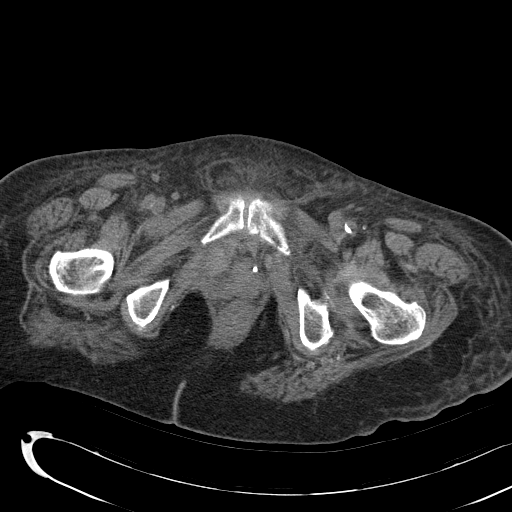
[im 17/84  soft-tissue]
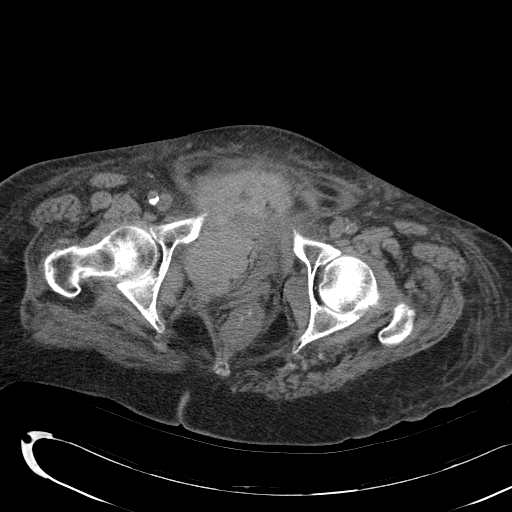
[im 23/84  soft-tissue]
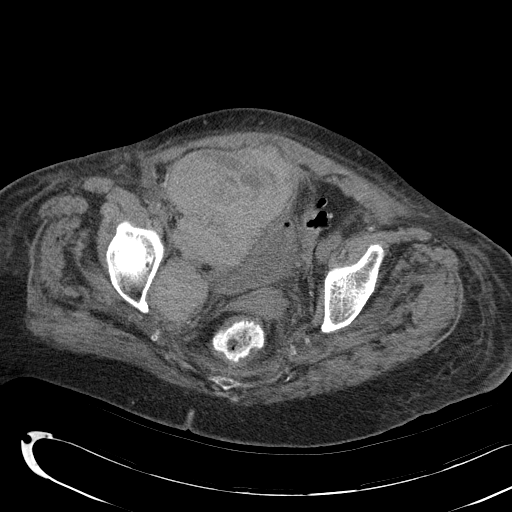
[im 28/84  soft-tissue]
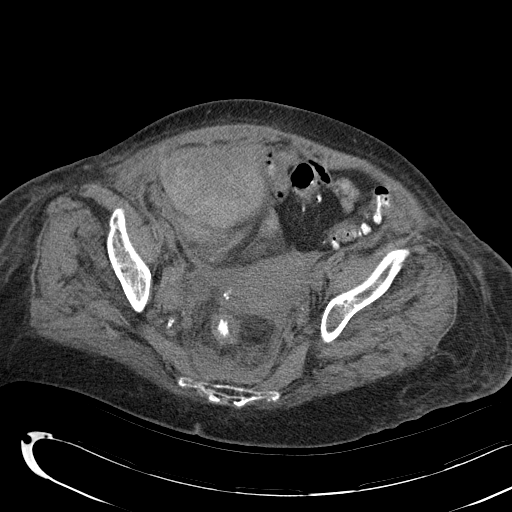
[im 34/84  soft-tissue]
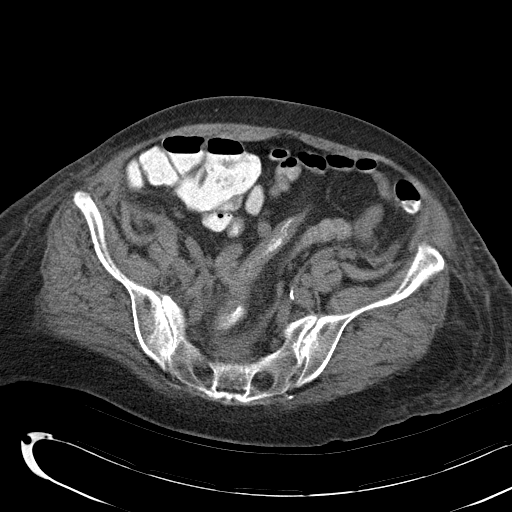
[im 39/84  soft-tissue]
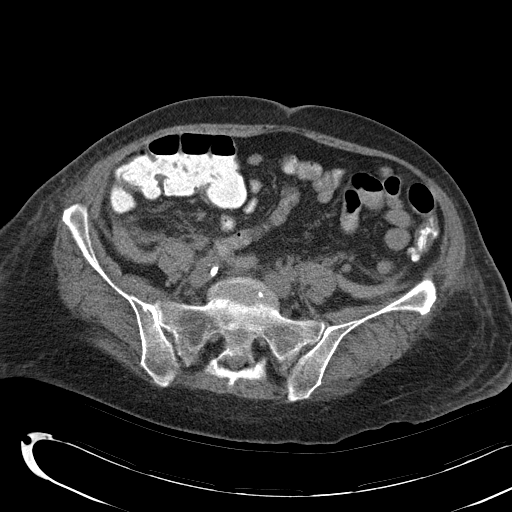
[im 45/84  soft-tissue]
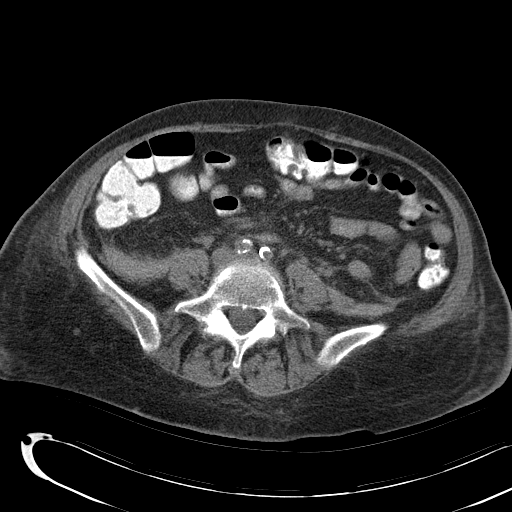
[im 50/84  soft-tissue]
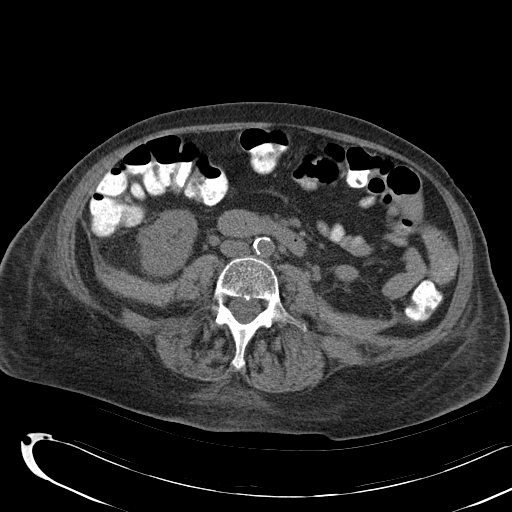
[im 50/84  bone]
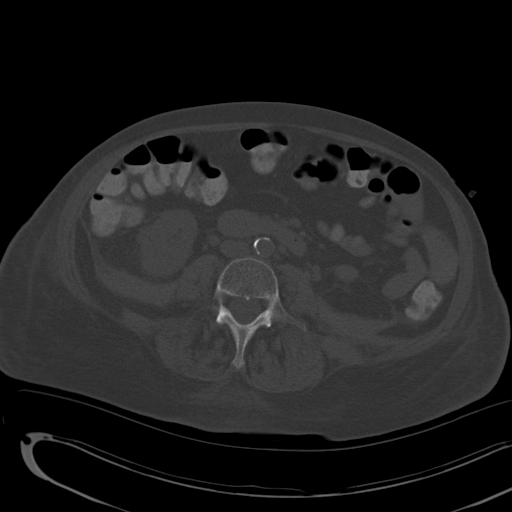
[im 56/84  soft-tissue]
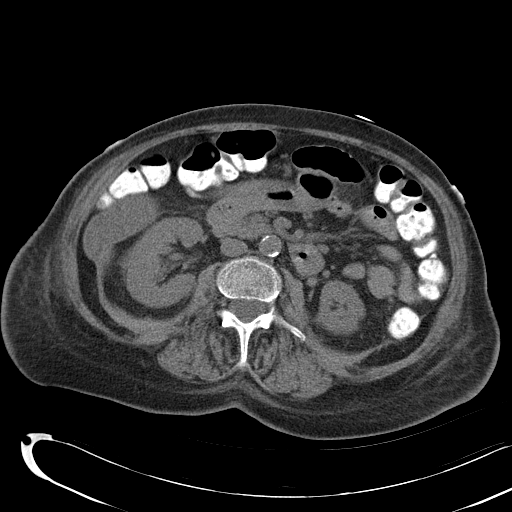
[im 61/84  soft-tissue]
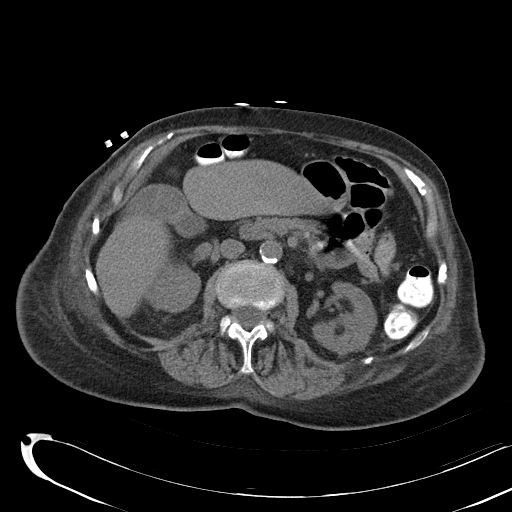
[im 67/84  soft-tissue]
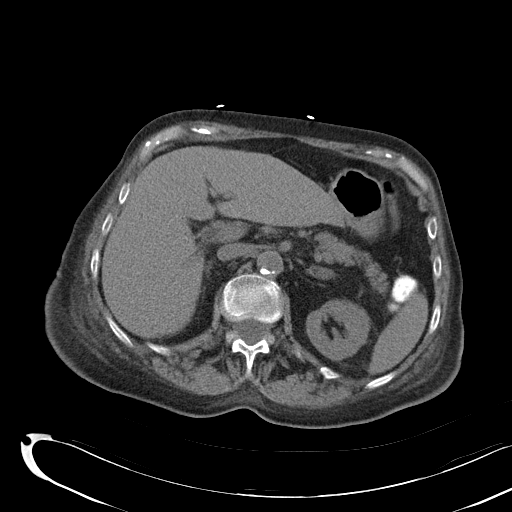
[im 72/84  soft-tissue]
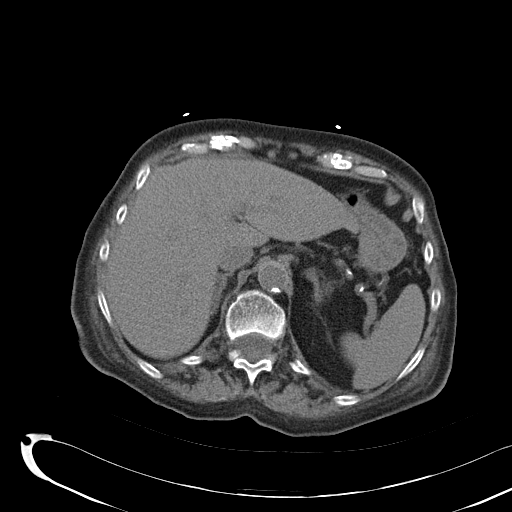
[im 78/84  soft-tissue]
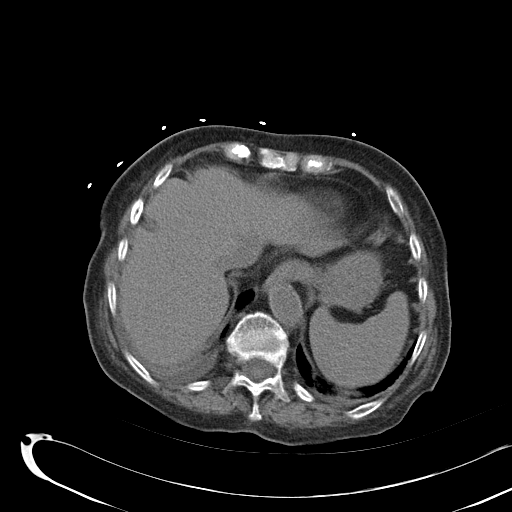

[Series 4: mpr cor 3.0mm · coronal · 0.73mm/px · 3 of 96 slices shown]
[im 32/96  soft-tissue]
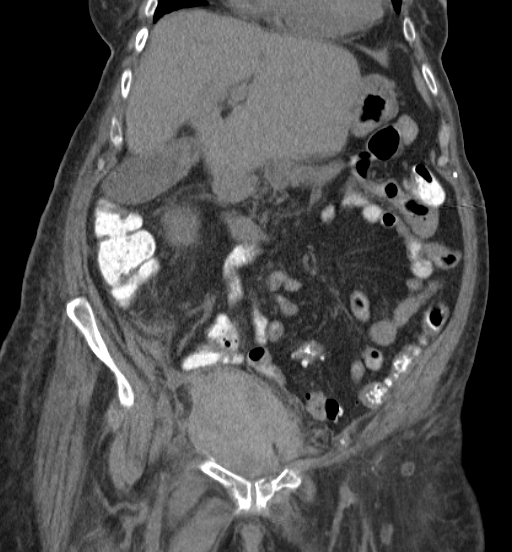
[im 43/96  soft-tissue]
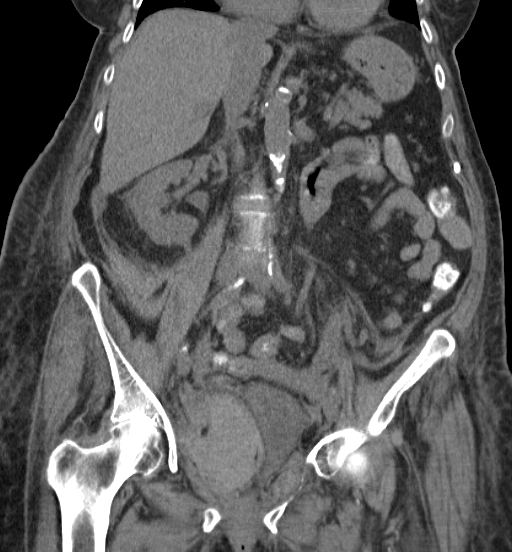
[im 53/96  soft-tissue]
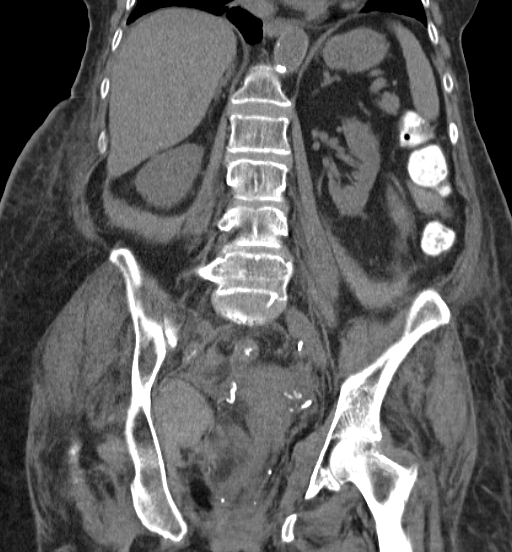

[17 of 46 positions shown; findings below may reference images not displayed]

FINDINGS: Lung bases:  Interval development of small bilateral
pleural effusions.  Heart size is normal.  No pericardial effusion.

Abdomen/pelvis:  There is no focal liver abnormality.  Gallbladder
appears normal.  No biliary dilatation.  The pancreas is
unremarkable.  The spleen is negative.

Bilateral low attenuation nodules are identified within the adrenal
glands.  Unchanged from previous studies.  Likely small adenomas.
Mild perinephric fat stranding is noted bilaterally.  This is
similar to previous exam.  No hydronephrosis or hydroureter. Right
iliac fossa and right pelvic side wall hematoma measures 15 x 8.3 x
7.7 cm.  Previously this measured 15.7 x 9 x 8.7 cm.  This exerts
significant mass effect upon the urinary bladder, uterus and
adnexal structures. Stable to improved appearance of bilateral
retroperitoneal hematomas.  There has been interval decrease in
bilateral rectus muscle hematomas.

The stomach and the small bowel loops appear within normal limits.
Normal appearance of the colon.

Bones/Musculoskeletal:  Again identified are left inferior and
superior pubic rami fractures as well as fractures involving the
left pubic bone.
IMPRESSION: 1.  Stable to improved appearance of large pelvic hematoma,
bilateral retroperitoneal hemorrhage, and rectus muscle hematomas.
2.  Stable pelvic fractures.
3.  Small bilateral pleural effusions are new from previous exam.

## 2013-10-07 ENCOUNTER — Ambulatory Visit (INDEPENDENT_AMBULATORY_CARE_PROVIDER_SITE_OTHER): Payer: Medicare Other | Admitting: Cardiology

## 2013-10-07 ENCOUNTER — Encounter: Payer: Self-pay | Admitting: Cardiology

## 2013-10-07 VITALS — BP 96/62 | Ht 64.0 in | Wt 144.1 lb

## 2013-10-07 DIAGNOSIS — I4821 Permanent atrial fibrillation: Secondary | ICD-10-CM

## 2013-10-07 DIAGNOSIS — I959 Hypotension, unspecified: Secondary | ICD-10-CM

## 2013-10-07 DIAGNOSIS — I4891 Unspecified atrial fibrillation: Secondary | ICD-10-CM

## 2013-10-07 DIAGNOSIS — I2510 Atherosclerotic heart disease of native coronary artery without angina pectoris: Secondary | ICD-10-CM

## 2013-10-07 NOTE — Assessment & Plan Note (Signed)
Chronic history, on ProAmatine.

## 2013-10-07 NOTE — Assessment & Plan Note (Signed)
Symptomatically stable, rate controlled. ECG reviewed. She will continue aspirin for now. No change in current regimen including Cardizem and Lanoxin.

## 2013-10-07 NOTE — Progress Notes (Signed)
Clinical Summary Deborah [name] is an 80 y.o.female last seen in June 2014. Overall she has been doing well. She has a area around her house for she walks a regular basis for exercise. She reports no chest pain or palpitations.  She continues on aspirin, has preferred to hold off on further attempts at anticoagulation with history of orthostatic hypotension and prior fall, also previous GI bleeds. She has had no additional falls however. We did discuss this today, she had previously been on Xarelto - if anticoagulation were every instituted, I would likely try Eliquis.  ECG today shows rate-controlled atrial fibrillation with IVCD and repolarization abnormalities.   Allergies  Allergen Reactions  . Omnipaque [Iohexol] Shortness Of Breath and Other (See Comments)    Short of breath with chest tightness after IV injection in CT, pt was fine when she left department but developed symptoms in parking lot and went to emergency department.  . Carbamazepine     REACTION: toxemia  . Tramadol Other (See Comments)    Unknown     Current Outpatient Prescriptions  Medication Sig Dispense Refill  . acetaminophen (TYLENOL) 325 MG tablet Take 650 mg by mouth every 6 (six) hours as needed. For pain       . albuterol (PROVENTIL) (2.5 MG/3ML) 0.083% nebulizer solution Take 2.5 mg by nebulization 2 (two) times daily as needed.      Marland Kitchen. albuterol (VENTOLIN HFA) 108 (90 BASE) MCG/ACT inhaler Inhale 2 puffs into the lungs 4 (four) times daily as needed. Shortness of breath      . alendronate (FOSAMAX) 70 MG tablet Take 70 mg by mouth once a week. Take with a full glass of water on an empty stomach.      Marland Kitchen. aspirin (ASPIRIN EC) 81 MG EC tablet Take 1 tablet (81 mg total) by mouth daily. Swallow whole.      Marland Kitchen. atorvastatin (LIPITOR) 40 MG tablet Take 1 tablet (40 mg total) by mouth daily at 6 PM.      . Calcium Carbonate-Vit D-Min (CALTRATE 600+D PLUS) 600-400 MG-UNIT per tablet Take 1 tablet by mouth 2 (two) times  daily.        . Carboxymethylcellulose Sodium (THERATEARS OP) Place 4 drops into both eyes 4 (four) times daily.        . digoxin (LANOXIN) 0.25 MG tablet Take 1 tablet (0.25 mg total) by mouth daily.      Marland Kitchen. diltiazem (CARDIZEM) 30 MG tablet TAKE 1/2 TABLET BY MOUTH EVERY 8 HOURS.  45 tablet  3  . fexofenadine (ALLEGRA) 180 MG tablet Take 180 mg by mouth every morning.      . furosemide (LASIX) 20 MG tablet Take 0.5 tablets (10 mg total) by mouth 2 (two) times daily.      Marland Kitchen. levETIRAcetam (KEPPRA) 250 MG tablet Takes one tablet in the morning and half a tablet at 3 pm.      . midodrine (PROAMATINE) 2.5 MG tablet Take 1 tablet (2.5 mg total) by mouth 2 (two) times daily with a meal. The dosing can be titrated down to once daily when her systolic blood pressure is consistently in the 100s.      . Multiple Vitamins-Minerals (ICAPS) CAPS Take 1 capsule by mouth 2 (two) times daily.      . nitroGLYCERIN (NITROSTAT) 0.4 MG SL tablet Place 0.4 mg under the tongue every 5 (five) minutes as needed for chest pain.      Marland Kitchen. omeprazole (PRILOSEC OTC) 20 MG tablet  Take 20 mg by mouth daily.       Marland Kitchen. oxyCODONE (OXY IR/ROXICODONE) 5 MG immediate release tablet Take 1 tablet (5 mg total) by mouth every 4 (four) hours as needed.  30 tablet  0  . potassium chloride (KLOR-CON) 10 MEQ CR tablet Take 10 mEq by mouth daily.         No current facility-administered medications for this visit.    Past Medical History  Diagnosis Date  . Chronic diastolic heart failure   . Atrial fibrillation     Permanent  . COPD (chronic obstructive pulmonary disease)   . Drug intolerance     Flecacinide and Amiodarone   . Asthma   . Umbilical hernia   . Coronary atherosclerosis of native coronary artery     BMS circumflex 2009  . Chronic anticoagulation     Xarelto discontinued after fall  . Seizures     Remote history of over 20 years ago  . Fracture of multiple pubic rami 12/13    Left superior and inferior - following fall   . Retroperitoneal hemorrhage 12/13    Following fall  . Hypotension   . Mixed hyperlipidemia     Past Surgical History  Procedure Laterality Date  . Radiofrequency catheter ablation      Failed  . Right carpel tunnel release    . Cataract extraction    . Tonsillectomy      Social History Deborah [name] reports that she quit smoking about 23 years ago. Her smoking use included Cigarettes. She has a 32 pack-year smoking history. She has never used smokeless tobacco. Deborah [name] reports that she does not drink alcohol.  Review of Systems Negative except as outlined.  Physical Examination Filed Vitals:   10/07/13 1058  BP: 96/62   Filed Weights   10/07/13 1058  Weight: 144 lb 1.9 oz (65.372 kg)    Comfortable at rest.  HEENT: Conjunctiva and lids normal, oropharynx clear.  Neck: Supple, no elevated JVP or carotid bruits, no thyromegaly.  Lungs: Clear to auscultation, nonlabored breathing at rest.  Cardiac: Irregularly irregular, no S3, soft systolic murmur, no pericardial rub.  Abdomen: Soft, nontender, bowel sounds present, no guarding or rebound.  Extremities: No pitting edema, distal pulses 2+.  Skin: Warm and dry.  Musculoskeletal: Mild kyphosis.    Problem List and Plan   Permanent atrial fibrillation Symptomatically stable, rate controlled. ECG reviewed. She will continue aspirin for now. No change in current regimen including Cardizem and Lanoxin.  Hypotension Chronic history, on ProAmatine.  Coronary atherosclerosis of native coronary artery No active angina symptoms.    Jonelle SidleSamuel G. Caycee Wanat, M.D., F.A.C.C.

## 2013-10-07 NOTE — Assessment & Plan Note (Signed)
No active angina symptoms. 

## 2013-10-07 NOTE — Patient Instructions (Signed)

## 2014-06-16 ENCOUNTER — Ambulatory Visit: Payer: Medicare Other | Admitting: Cardiology

## 2014-06-23 ENCOUNTER — Telehealth (INDEPENDENT_AMBULATORY_CARE_PROVIDER_SITE_OTHER): Payer: Self-pay | Admitting: *Deleted

## 2014-06-23 NOTE — Telephone Encounter (Signed)
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 Deborah [name] is having a lot of GI issues, diarrhea then constipation. Her return phone number is [phone].

## 2014-06-23 NOTE — Telephone Encounter (Signed)
Dr.Rehman  - Deborah GeorgiaSusan [name] would like to tal with you about her Mom [phone]

## 2014-06-24 NOTE — Telephone Encounter (Signed)
Call returned last evening. Talk with patient's daughters Ms. Lonn GeorgiaSusan Sparks. Advised that she take fiber supplement 3-4 g daily and continue stool softener and keep stool diary for 2 weeks.

## 2014-07-14 ENCOUNTER — Encounter: Payer: Self-pay | Admitting: Cardiology

## 2014-07-14 ENCOUNTER — Ambulatory Visit (INDEPENDENT_AMBULATORY_CARE_PROVIDER_SITE_OTHER): Payer: Medicare Other | Admitting: Cardiology

## 2014-07-14 VITALS — BP 93/57 | HR 91 | Ht 66.0 in | Wt 147.0 lb

## 2014-07-14 DIAGNOSIS — I482 Chronic atrial fibrillation, unspecified: Secondary | ICD-10-CM

## 2014-07-14 DIAGNOSIS — I2510 Atherosclerotic heart disease of native coronary artery without angina pectoris: Secondary | ICD-10-CM

## 2014-07-14 NOTE — Progress Notes (Signed)
Cardiology Office Note  Date: 07/14/2014   ID: Deborah [name], DOB 08/23/1925, MRN [id number]  PCP: Ignatius Specking., MD  Primary Cardiologist: Nona Dell, MD   Chief Complaint  Patient presents with  . Atrial Fibrillation  . Coronary Artery Disease  . Diastolic heart failure    History of Present Illness: Deborah [name] is an 81 y.o. female last seen in May 2015. She is here with a family member today for a routine follow-up visit. She continues to live at home with assistance, uses a cane to walk. She has had declining vision with macular degeneration, previous history of fall with pelvic fracture. She has not been anticoagulated, but has tolerated aspirin and low-dose Cardizem as well as digoxin for rate control of atrial fibrillation. Chronic hypotension limits drug titration.  She reports intermittent sense of palpitations but nothing progressive. She continues to follow at [facility]. Reports no other major health changes.   Past Medical History  Diagnosis Date  . Chronic diastolic heart failure   . Atrial fibrillation     Permanent  . COPD (chronic obstructive pulmonary disease)   . Drug intolerance     Flecacinide and Amiodarone   . Asthma   . Umbilical hernia   . Coronary atherosclerosis of native coronary artery     BMS circumflex 2009  . Chronic anticoagulation     Xarelto discontinued after fall  . Seizures     Remote history of over 20 years ago  . Fracture of multiple pubic rami 12/13    Left superior and inferior - following fall  . Retroperitoneal hemorrhage 12/13    Following fall  . Hypotension   . Mixed hyperlipidemia     Current Outpatient Prescriptions  Medication Sig Dispense Refill  . acetaminophen (TYLENOL) 325 MG tablet Take 650 mg by mouth every 6 (six) hours as needed. For pain     . albuterol (PROVENTIL) (2.5 MG/3ML) 0.083% nebulizer solution Take 2.5 mg by nebulization 2 (two) times daily as needed.    Marland Kitchen albuterol  (VENTOLIN HFA) 108 (90 BASE) MCG/ACT inhaler Inhale 2 puffs into the lungs 4 (four) times daily as needed. Shortness of breath    . alendronate (FOSAMAX) 70 MG tablet Take 70 mg by mouth once a week. Take with a full glass of water on an empty stomach.    Marland Kitchen aspirin (ASPIRIN EC) 81 MG EC tablet Take 1 tablet (81 mg total) by mouth daily. Swallow whole.    Marland Kitchen atorvastatin (LIPITOR) 40 MG tablet Take 1 tablet (40 mg total) by mouth daily at 6 PM. (Patient taking differently: Take 40 mg by mouth. Monday, Wednesday, & Friday)    . Calcium Carbonate-Vit D-Min (CALTRATE 600+D PLUS) 600-400 MG-UNIT per tablet Take 1 tablet by mouth 2 (two) times daily.      . Carboxymethylcellulose Sodium (THERATEARS OP) Place 4 drops into both eyes 4 (four) times daily.      . digoxin (LANOXIN) 0.25 MG tablet Take 1 tablet (0.25 mg total) by mouth daily. (Patient taking differently: Take 0.025 mg by mouth daily. )    . diltiazem (CARDIZEM) 30 MG tablet TAKE 1/2 TABLET BY MOUTH EVERY 8 HOURS. (Patient taking differently: TAKE 1/2 TABLET BY MOUTH twice a day) 45 tablet 3  . docusate sodium (COLACE) 100 MG capsule Take 100 mg by mouth at bedtime.    . fexofenadine (ALLEGRA) 180 MG tablet Take 180 mg by mouth every morning.    . furosemide (LASIX)  40 MG tablet Take 40 mg by mouth 2 (two) times daily.    Marland Kitchen levETIRAcetam (KEPPRA) 250 MG tablet Takes one tablet in the morning and half a tablet at 3 pm.    . levothyroxine (SYNTHROID, LEVOTHROID) 25 MCG tablet Take 25 mcg by mouth daily before breakfast.    . midodrine (PROAMATINE) 2.5 MG tablet Take 1 tablet (2.5 mg total) by mouth 2 (two) times daily with a meal. The dosing can be titrated down to once daily when her systolic blood pressure is consistently in the 100s.    . Multiple Vitamins-Minerals (ICAPS) CAPS Take 1 capsule by mouth 2 (two) times daily.    . nitroGLYCERIN (NITROSTAT) 0.4 MG SL tablet Place 0.4 mg under the tongue every 5 (five) minutes as needed for chest pain.     Marland Kitchen omeprazole (PRILOSEC OTC) 20 MG tablet Take 20 mg by mouth daily.     . potassium chloride (KLOR-CON) 10 MEQ CR tablet Take 10 mEq by mouth daily.      . Probiotic Product (PROBIOTIC DAILY PO) Take 1 tablet by mouth every morning.     No current facility-administered medications for this visit.    Allergies:  Ivp dye; Omnipaque; Sulfa antibiotics; Carbamazepine; Dilaudid; and Tramadol   Social History: The patient  reports that she quit smoking about 24 years ago. Her smoking use included Cigarettes. She has a 32 pack-year smoking history. She has never used smokeless tobacco. She reports that she does not drink alcohol or use illicit drugs.   ROS:  Please see the history of present illness. Otherwise, complete review of systems is positive for decreased vision.  All other systems are reviewed and negative.    Physical Exam: VS:  BP 93/57 mmHg  Pulse 91  Ht  (1.676 m)  Wt 147 lb (66.679 kg)  BMI 23.74 kg/m2  SpO2 96%, BMI Body mass index is 23.74 kg/(m^2).  Wt Readings from Last 3 Encounters:  07/14/14 147 lb (66.679 kg)  10/07/13 144 lb 1.9 oz (65.372 kg)  11/13/12 132 lb (59.875 kg)     Comfortable at rest.  HEENT: Conjunctiva and lids normal, oropharynx clear.  Neck: Supple, no elevated JVP or carotid bruits, no thyromegaly.  Lungs: Clear to auscultation, nonlabored breathing at rest.  Cardiac: Irregularly irregular, no S3, soft systolic murmur, no pericardial rub.  Abdomen: Soft, nontender, bowel sounds present, no guarding or rebound.  Extremities: No pitting edema, distal pulses 2+.  Skin: Warm and dry.  Musculoskeletal: Mild kyphosis.    ECG: ECG is not ordered today.   Other Studies Reviewed Today:  Myoview 07-15-2009: MYOCARDIAL IMAGING WITH SPECT (REST AND PHARMACOLOGIC-STRESS) GATED LEFT VENTRICULAR WALL MOTION STUDY LEFT VENTRICULAR EJECTION FRACTION  Technique: Standard myocardial SPECT imaging was performed after resting intravenous  injection of 10 mCi Tc-39m tetrofosmin. Subsequently, intravenous infusion of Lexiscan was performed under the supervision of the Cardiology staff. At peak effect of the drug, 30 mCi Tc-60m tetrofosmin was injected intravenously and standard myocardial SPECT imaging was performed. Quantitative gated imaging was also performed to evaluate left ventricular wall motion, and estimate left ventricular ejection fraction.  Comparison: None.  Findings: Utilizing gated data, the end-diastolic volume is estimated be 49 ml and the end-systolic volume 15 ml. Calculated ejection fraction is 69%.  The gated wall motion analysis shows normal left ventricular wall motion.  SPECT imaging shows no evidence of ischemia or perfusion defects.  IMPRESSION: Normal study demonstrating no evidence of ischemia. Left ventricular function is normal  with calculated ejection fraction of 69%.  Assessment and Plan:  1. Chronic atrial fibrillation. Continue aspirin, Cardizem, and digoxin. She is not anticoagulated with increased bleeding risk (chronic hypotension, previous fall with retroperitoneal hemorrhage, declining vision).  2. CAD without active angina symptoms, remote BMS to circumflex in 2009. We will continue conservative management for now.  Current medicines are reviewed at length with the patient today.  The patient does not have concerns regarding medicines.  Disposition: FU with me in 6 months.   Signed, Jonelle SidleSamuel G. Ziyan Hillmer, MD, [facility] SurgeryFACC 07/14/2014 2:34 PM    [facility] [address], [place], [place] [address] Phone: [phone]; Fax: [phone]

## 2014-12-24 ENCOUNTER — Encounter: Payer: Self-pay | Admitting: Cardiology

## 2014-12-24 ENCOUNTER — Ambulatory Visit (INDEPENDENT_AMBULATORY_CARE_PROVIDER_SITE_OTHER): Payer: Medicare Other | Admitting: Cardiology

## 2014-12-24 VITALS — BP 89/60 | HR 68 | Ht 65.0 in | Wt 138.0 lb

## 2014-12-24 DIAGNOSIS — I959 Hypotension, unspecified: Secondary | ICD-10-CM

## 2014-12-24 DIAGNOSIS — I482 Chronic atrial fibrillation: Secondary | ICD-10-CM | POA: Diagnosis not present

## 2014-12-24 DIAGNOSIS — I4821 Permanent atrial fibrillation: Secondary | ICD-10-CM

## 2014-12-24 DIAGNOSIS — I2510 Atherosclerotic heart disease of native coronary artery without angina pectoris: Secondary | ICD-10-CM | POA: Diagnosis not present

## 2014-12-24 NOTE — Progress Notes (Signed)
Cardiology Office Note  Date: 12/24/2014   ID: STEPHANE JUNKINS, DOB 05/19/1926, MRN [id number]  PCP: Ignatius Specking., MD  Primary Cardiologist: Nona Dell, MD   Chief Complaint  Patient presents with  . Atrial Fibrillation  . Coronary Artery Disease    History of Present Illness: Deborah [name] is an 81 y.o. female last seen in February. She is here today with her daytime assistant for a follow-up visit. She states that she is generally happy, has not had any major change in appetite or functional capacity. She does not report any palpitations or chest pain. Her medications are outlined below.  Main limitation and frustration is declining vision with macular degeneration. She is not able to read at this time, which she enjoyed throughout her life.  Follow-up ECG shows rate-controlled atrial fibrillation with nonspecific ST-T changes.  She has history of chronically low blood pressure, although has been symptomatically stable without dizziness or syncope.   Past Medical History  Diagnosis Date  . Chronic diastolic heart failure   . Atrial fibrillation     Permanent  . COPD (chronic obstructive pulmonary disease)   . Drug intolerance     Flecacinide and Amiodarone   . Asthma   . Umbilical hernia   . Coronary atherosclerosis of native coronary artery     BMS circumflex 2009  . Chronic anticoagulation     Xarelto discontinued after fall  . Seizures     Remote history of over 20 years ago  . Fracture of multiple pubic rami 12/13    Left superior and inferior - following fall  . Retroperitoneal hemorrhage 12/13    Following fall  . Hypotension   . Mixed hyperlipidemia     Current Outpatient Prescriptions  Medication Sig Dispense Refill  . acetaminophen (TYLENOL) 325 MG tablet Take 650 mg by mouth every 6 (six) hours as needed. For pain     . albuterol (PROVENTIL) (2.5 MG/3ML) 0.083% nebulizer solution Take 2.5 mg by nebulization 2 (two) times daily as needed.    Marland Kitchen  albuterol (VENTOLIN HFA) 108 (90 BASE) MCG/ACT inhaler Inhale 2 puffs into the lungs 4 (four) times daily as needed. Shortness of breath    . alendronate (FOSAMAX) 70 MG tablet Take 70 mg by mouth once a week. Take with a full glass of water on an empty stomach.    Marland Kitchen aspirin (ASPIRIN EC) 81 MG EC tablet Take 1 tablet (81 mg total) by mouth daily. Swallow whole.    Marland Kitchen atorvastatin (LIPITOR) 40 MG tablet Take 1 tablet (40 mg total) by mouth daily at 6 PM. (Patient taking differently: Take 40 mg by mouth. Monday, Wednesday, & Friday)    . Calcium Carbonate-Vit D-Min (CALTRATE 600+D PLUS) 600-400 MG-UNIT per tablet Take 1 tablet by mouth 2 (two) times daily.      . Carboxymethylcellulose Sodium (THERATEARS OP) Place 4 drops into both eyes 4 (four) times daily.      . digoxin (LANOXIN) 0.25 MG tablet Take 1 tablet (0.25 mg total) by mouth daily. (Patient taking differently: Take 0.025 mg by mouth daily. )    . diltiazem (CARDIZEM) 30 MG tablet TAKE 1/2 TABLET BY MOUTH EVERY 8 HOURS. (Patient taking differently: TAKE 1/2 TABLET BY MOUTH twice a day) 45 tablet 3  . docusate sodium (COLACE) 100 MG capsule Take 100 mg by mouth at bedtime.    . fexofenadine (ALLEGRA) 180 MG tablet Take 180 mg by mouth every morning.    . furosemide (  LASIX) 40 MG tablet Take 10 mg by mouth 2 (two) times daily.    Marland Kitchen levETIRAcetam (KEPPRA) 250 MG tablet Takes one tablet in the morning and half a tablet at 3 pm.    . levothyroxine (SYNTHROID, LEVOTHROID) 25 MCG tablet Take 25 mcg by mouth daily before breakfast.    . midodrine (PROAMATINE) 2.5 MG tablet Take 1 tablet (2.5 mg total) by mouth 2 (two) times daily with a meal. The dosing can be titrated down to once daily when her systolic blood pressure is consistently in the 100s.    . Multiple Vitamins-Minerals (ICAPS) CAPS Take 1 capsule by mouth 2 (two) times daily.    . nitroGLYCERIN (NITROSTAT) 0.4 MG SL tablet Place 0.4 mg under the tongue every 5 (five) minutes as needed for  chest pain.    Marland Kitchen omeprazole (PRILOSEC OTC) 20 MG tablet Take 20 mg by mouth daily.     . potassium chloride (KLOR-CON) 10 MEQ CR tablet Take 10 mEq by mouth daily.      . Probiotic Product (PROBIOTIC DAILY PO) Take 1 tablet by mouth every morning.     No current facility-administered medications for this visit.    Allergies:  Ivp dye; Omnipaque; Sulfa antibiotics; Carbamazepine; Dilaudid; and Tramadol   Social History: The patient  reports that she quit smoking about 24 years ago. Her smoking use included Cigarettes. She has a 32 pack-year smoking history. She has never used smokeless tobacco. She reports that she does not drink alcohol or use illicit drugs.   ROS:  Please see the history of present illness. Otherwise, complete review of systems is positive for declining vision.  All other systems are reviewed and negative.   Physical Exam: VS:  BP 89/60 mmHg  Pulse 68  Ht  (1.651 m)  Wt 138 lb (62.596 kg)  BMI 22.96 kg/m2  SpO2 98%, BMI Body mass index is 22.96 kg/(m^2).  Wt Readings from Last 3 Encounters:  12/24/14 138 lb (62.596 kg)  07/14/14 147 lb (66.679 kg)  10/07/13 144 lb 1.9 oz (65.372 kg)     Comfortable at rest.  HEENT: Conjunctiva and lids normal, oropharynx clear.  Neck: Supple, no elevated JVP or carotid bruits, no thyromegaly.  Lungs: Clear to auscultation, nonlabored breathing at rest.  Cardiac: Irregularly irregular, no S3, soft systolic murmur, no pericardial rub.  Abdomen: Soft, nontender, bowel sounds present, no guarding or rebound.  Extremities: No pitting edema, distal pulses 2+.  Skin: Warm and dry.  Musculoskeletal: Mild kyphosis.    ECG: ECG is ordered today showing atrial fibrillation with nonspecific ST-T changes.  Other Studies Reviewed Today:  Echocardiogram 04/23/2012: Study Conclusions  - Left ventricle: The cavity size was normal. There was mild concentric hypertrophy. Systolic function was normal. The estimated  ejection fraction was in the range of 60% to 65%. Wall motion was normal; there were no regional wall motion abnormalities. - Aortic valve: Mildly calcified annulus. Mildly calcified leaflets. - Mitral valve: Calcified annulus. - Left atrium: The atrium was moderately dilated. - Right atrium: The atrium was mildly to moderately dilated. - Atrial septum: No defect or patent foramen ovale was identified.   Assessment and Plan:  1. Chronic atrial fibrillation. Continue heart rate control strategy with combination of Cardizem and Lanoxin. She is not anticoagulated with previous history of falls, retroperitoneal hemorrhage, chronically low blood pressure, and declining vision - all of which increase her risk for injury and bleeding.  2. Chronic hypotension, asymptomatic at this time. She  continues on Proamatine.  3. CAD, symptomatically stable. Continue observation.  Current medicines were reviewed with the patient today.   Orders Placed This Encounter  Procedures  . EKG 12-Lead    Disposition: FU with me in 6 months.   Signed, Jonelle Sidle, MD, [facility] 12/24/2014 2:08 PM    [facility] at [facility] [address] [place], [place], [place] [address] Phone: [phone]; Fax: [phone]

## 2014-12-24 NOTE — Patient Instructions (Signed)
Your physician recommends that you continue on your current medications as directed. Please refer to the Current Medication list given to you today. Your physician recommends that you schedule a follow-up appointment in: 6 months. You will receive a reminder letter in the mail in about 4 months reminding you to call and schedule your appointment. If you don't receive this letter, please contact our office. 

## 2015-07-03 ENCOUNTER — Ambulatory Visit (INDEPENDENT_AMBULATORY_CARE_PROVIDER_SITE_OTHER): Payer: Medicare Other | Admitting: Cardiology

## 2015-07-03 ENCOUNTER — Encounter: Payer: Self-pay | Admitting: Cardiology

## 2015-07-03 VITALS — BP 90/64 | HR 80 | Ht 65.0 in | Wt 144.6 lb

## 2015-07-03 DIAGNOSIS — I2510 Atherosclerotic heart disease of native coronary artery without angina pectoris: Secondary | ICD-10-CM | POA: Diagnosis not present

## 2015-07-03 DIAGNOSIS — I959 Hypotension, unspecified: Secondary | ICD-10-CM | POA: Diagnosis not present

## 2015-07-03 DIAGNOSIS — I482 Chronic atrial fibrillation, unspecified: Secondary | ICD-10-CM

## 2015-07-03 NOTE — Patient Instructions (Signed)

## 2015-07-03 NOTE — Progress Notes (Signed)
Cardiology Office Note  Date: 07/03/2015   ID: Deborah [name], DOB [birth year], MRN [id number]  PCP: Ignatius Specking., MD  Primary Cardiologist: Nona Dell, MD   Chief Complaint  Patient presents with  . Atrial Fibrillation  . Coronary Artery Disease    History of Present Illness: Deborah [name] is an 82 y.o. female last seen in August 2016. She presents for a follow-up visit. Other than having declining vision related to macular degeneration, she states that she feels very well. She does not endorse any chest pain or palpitations. She has an Geophysicist/field seismologist with her most of the time who reads to her and keeps her up with current events.  I reviewed her medications which are outlined below. Cardiac regimen includes aspirin, Lipitor, Lanoxin, Cardizem, Lasix, proamatine, and potassium supplements.  Blood pressure is low normal today, she has no symptoms with this.  Past Medical History  Diagnosis Date  . Chronic diastolic heart failure (HCC)   . Atrial fibrillation (HCC)     Permanent  . COPD (chronic obstructive pulmonary disease) (HCC)   . Drug intolerance     Flecacinide and Amiodarone   . Asthma   . Umbilical hernia   . Coronary atherosclerosis of native coronary artery     BMS circumflex 2009  . Chronic anticoagulation     Xarelto discontinued after fall  . Seizures (HCC)     Remote history of over 20 years ago  . Fracture of multiple pubic rami (HCC) 12/13    Left superior and inferior - following fall  . Retroperitoneal hemorrhage 12/13    Following fall  . Hypotension   . Mixed hyperlipidemia     Current Outpatient Prescriptions  Medication Sig Dispense Refill  . acetaminophen (TYLENOL) 325 MG tablet Take 650 mg by mouth every 6 (six) hours as needed. For pain     . albuterol (PROVENTIL) (2.5 MG/3ML) 0.083% nebulizer solution Take 2.5 mg by nebulization 2 (two) times daily as needed.    Marland Kitchen albuterol (VENTOLIN HFA) 108 (90 BASE) MCG/ACT inhaler Inhale 2 puffs into  the lungs 4 (four) times daily as needed. Shortness of breath    . alendronate (FOSAMAX) 70 MG tablet Take 70 mg by mouth once a week. Take with a full glass of water on an empty stomach.    Marland Kitchen aspirin (ASPIRIN EC) 81 MG EC tablet Take 1 tablet (81 mg total) by mouth daily. Swallow whole.    Marland Kitchen atorvastatin (LIPITOR) 40 MG tablet Take 40 mg by mouth. Monday, Wednesday, & Friday evening.    . Calcium Carbonate-Vit D-Min (CALTRATE 600+D PLUS) 600-400 MG-UNIT per tablet Take 1 tablet by mouth 2 (two) times daily.      . Carboxymethylcellulose Sodium (THERATEARS OP) Place 4 drops into both eyes 4 (four) times daily. As needed    . digoxin (LANOXIN) 0.25 MG tablet Take 0.025 mg by mouth every evening.    . diltiazem (CARDIZEM) 30 MG tablet Take 15 mg by mouth 2 (two) times daily.    Marland Kitchen docusate sodium (COLACE) 100 MG capsule Take 100 mg by mouth at bedtime.    . fexofenadine (ALLEGRA) 180 MG tablet Take 180 mg by mouth as needed.     . furosemide (LASIX) 40 MG tablet Take 20 mg by mouth 2 (two) times daily.     Marland Kitchen levETIRAcetam (KEPPRA) 250 MG tablet Takes one tablet in the morning and half a tablet at 3 pm.    . levothyroxine (SYNTHROID, LEVOTHROID) 25  MCG tablet Take 25 mcg by mouth daily before breakfast.    . midodrine (PROAMATINE) 2.5 MG tablet Take 1 tablet (2.5 mg total) by mouth 2 (two) times daily with a meal. The dosing can be titrated down to once daily when her systolic blood pressure is consistently in the 100s.    . Multiple Vitamins-Minerals (ICAPS) CAPS Take 1 capsule by mouth 2 (two) times daily.    . nitroGLYCERIN (NITROSTAT) 0.4 MG SL tablet Place 0.4 mg under the tongue every 5 (five) minutes as needed for chest pain.    Marland Kitchen omeprazole (PRILOSEC OTC) 20 MG tablet Take 20 mg by mouth. Monday, Wednesday, & Friday evening.    . potassium chloride (KLOR-CON) 10 MEQ CR tablet Take 10 mEq by mouth daily.      . Probiotic Product (PROBIOTIC DAILY PO) Take 1 tablet by mouth every morning.     No  current facility-administered medications for this visit.   Allergies:  Ivp dye; Omnipaque; Sulfa antibiotics; Carbamazepine; Dilaudid; and Tramadol   Social History: The patient  reports that she quit smoking about 25 years ago. Her smoking use included Cigarettes. She has a 32 pack-year smoking history. She has never used smokeless tobacco. She reports that she does not drink alcohol or use illicit drugs.   ROS:  Please see the history of present illness. Otherwise, complete review of systems is positive for poor vision.  All other systems are reviewed and negative.   Physical Exam: VS:  BP 90/64 mmHg  Pulse 80  Ht  (1.651 m)  Wt 144 lb 9.6 oz (65.59 kg)  BMI 24.06 kg/m2  SpO2 95%, BMI Body mass index is 24.06 kg/(m^2).  Wt Readings from Last 3 Encounters:  07/03/15 144 lb 9.6 oz (65.59 kg)  12/24/14 138 lb (62.596 kg)  07/14/14 147 lb (66.679 kg)    Elderly woman, appears comfortable at rest.  HEENT: Conjunctiva and lids normal, oropharynx clear.  Neck: Supple, no elevated JVP or carotid bruits, no thyromegaly.  Lungs: Clear to auscultation, nonlabored breathing at rest.  Cardiac: Irregularly irregular, no S3, soft systolic murmur, no pericardial rub.  Abdomen: Soft, nontender, bowel sounds present, no guarding or rebound.  Extremities: No pitting edema, distal pulses 2+.   ECG: I personally reviewed the previous tracing from 12/24/2014 which showed rate-controlled atrial fibrillation with nonspecific ST-T changes.  Other Studies Reviewed Today:  Echocardiogram 04/23/2012: Study Conclusions  - Left ventricle: The cavity size was normal. There was mild concentric hypertrophy. Systolic function was normal. The estimated ejection fraction was in the range of 60% to 65%. Wall motion was normal; there were no regional wall motion abnormalities. - Aortic valve: Mildly calcified annulus. Mildly calcified leaflets. - Mitral valve: Calcified annulus. - Left  atrium: The atrium was moderately dilated. - Right atrium: The atrium was mildly to moderately dilated. - Atrial septum: No defect or patent foramen ovale was identified.  Assessment and Plan:  1. Chronic atrial fibrillation, asymptomatic. Plan is to continue current heart rate control strategy. She is not anticoagulated with previous history of falls, retroperitoneal hemorrhage, chronically low blood pressure, and declining vision - all of which increase her risk for injury and bleeding.  2. Chronic hypotension, symptomatically stable on proamatine.  3. CAD with no active angina symptoms, continue conservative management.  Current medicines were reviewed with the patient today.  Disposition: FU with me in 6 months.   Signed, Jonelle Sidle, MD, [facility] 07/03/2015 4:25 PM    [facility] [place], [place], [place] [address] Phone: [phone]; Fax: [phone]

## 2016-09-10 ENCOUNTER — Emergency Department (HOSPITAL_COMMUNITY): Payer: Medicare Other

## 2016-09-10 ENCOUNTER — Encounter (HOSPITAL_COMMUNITY): Payer: Self-pay | Admitting: Emergency Medicine

## 2016-09-10 ENCOUNTER — Emergency Department (HOSPITAL_COMMUNITY)
Admission: EM | Admit: 2016-09-10 | Discharge: 2016-09-10 | Disposition: A | Discharge status: Home / Self Care | Payer: Medicare Other | Source: Home / Self Care | Attending: Emergency Medicine | Admitting: Emergency Medicine

## 2016-09-10 DIAGNOSIS — J45909 Unspecified asthma, uncomplicated: Secondary | ICD-10-CM | POA: Insufficient documentation

## 2016-09-10 DIAGNOSIS — R0602 Shortness of breath: Secondary | ICD-10-CM | POA: Diagnosis not present

## 2016-09-10 DIAGNOSIS — Z87891 Personal history of nicotine dependence: Secondary | ICD-10-CM

## 2016-09-10 DIAGNOSIS — R600 Localized edema: Secondary | ICD-10-CM

## 2016-09-10 DIAGNOSIS — I5032 Chronic diastolic (congestive) heart failure: Secondary | ICD-10-CM | POA: Insufficient documentation

## 2016-09-10 DIAGNOSIS — Z7982 Long term (current) use of aspirin: Secondary | ICD-10-CM

## 2016-09-10 DIAGNOSIS — G309 Alzheimer's disease, unspecified: Secondary | ICD-10-CM

## 2016-09-10 DIAGNOSIS — Z79899 Other long term (current) drug therapy: Secondary | ICD-10-CM

## 2016-09-10 DIAGNOSIS — J441 Chronic obstructive pulmonary disease with (acute) exacerbation: Secondary | ICD-10-CM | POA: Diagnosis not present

## 2016-09-10 DIAGNOSIS — I2510 Atherosclerotic heart disease of native coronary artery without angina pectoris: Secondary | ICD-10-CM

## 2016-09-10 HISTORY — DX: Alzheimer's disease, unspecified: G30.9

## 2016-09-10 HISTORY — DX: Dementia in other diseases classified elsewhere, unspecified severity, without behavioral disturbance, psychotic disturbance, mood disturbance, and anxiety: F02.80

## 2016-09-10 LAB — COMPREHENSIVE METABOLIC PANEL
ALK PHOS: 71 U/L (ref 38–126)
ALT: 19 U/L (ref 14–54)
AST: 28 U/L (ref 15–41)
Albumin: 4.3 g/dL (ref 3.5–5.0)
Anion gap: 9 (ref 5–15)
BUN: 16 mg/dL (ref 6–20)
CALCIUM: 9.5 mg/dL (ref 8.9–10.3)
CO2: 29 mmol/L (ref 22–32)
CREATININE: 0.75 mg/dL (ref 0.44–1.00)
Chloride: 106 mmol/L (ref 101–111)
GFR calc non Af Amer: >60 mL/min (ref >60)
GLUCOSE: 95 mg/dL (ref 65–99)
Potassium: 4.2 mmol/L (ref 3.5–5.1)
SODIUM: 144 mmol/L (ref 135–145)
Total Bilirubin: 0.5 mg/dL (ref 0.3–1.2)
Total Protein: 7 g/dL (ref 6.5–8.1)

## 2016-09-10 LAB — CBC WITH DIFFERENTIAL/PLATELET
BASOS PCT: 0 %
Basophils Absolute: 0 10*3/uL (ref 0.0–0.1)
EOS ABS: 0.1 10*3/uL (ref 0.0–0.7)
Eosinophils Relative: 1 %
HCT: 44.4 % (ref 36.0–46.0)
Hemoglobin: 14.2 g/dL (ref 12.0–15.0)
Lymphocytes Relative: 6 %
Lymphs Abs: 0.4 10*3/uL — ABNORMAL LOW (ref 0.7–4.0)
MCH: 28.9 pg (ref 26.0–34.0)
MCHC: 32 g/dL (ref 30.0–36.0)
MCV: 90.4 fL (ref 78.0–100.0)
MONO ABS: 1.1 10*3/uL — AB (ref 0.1–1.0)
MONOS PCT: 17 %
Neutro Abs: 4.8 10*3/uL (ref 1.7–7.7)
Neutrophils Relative %: 76 %
PLATELETS: 165 10*3/uL (ref 150–400)
RBC: 4.91 MIL/uL (ref 3.87–5.11)
RDW: 15.4 % (ref 11.5–15.5)
WBC: 6.3 10*3/uL (ref 4.0–10.5)

## 2016-09-10 LAB — BRAIN NATRIURETIC PEPTIDE: B NATRIURETIC PEPTIDE 5: 102 pg/mL — AB (ref 0.0–100.0)

## 2016-09-10 MED ORDER — IPRATROPIUM-ALBUTEROL 0.5-2.5 (3) MG/3ML IN SOLN
3.0000 mL | Freq: Once | RESPIRATORY_TRACT | Status: AC
  Administered 2016-09-10: 3 mL via RESPIRATORY_TRACT
  Filled 2016-09-10: qty 3

## 2016-09-10 MED ORDER — ALBUTEROL SULFATE (2.5 MG/3ML) 0.083% IN NEBU
5.0000 mg | INHALATION_SOLUTION | Freq: Once | RESPIRATORY_TRACT | Status: AC
  Administered 2016-09-10: 5 mg via RESPIRATORY_TRACT
  Filled 2016-09-10: qty 6

## 2016-09-10 MED ORDER — SODIUM CHLORIDE 0.9 % IV SOLN: INTRAVENOUS | Status: DC

## 2016-09-10 MED ORDER — PREDNISONE 10 MG PO TABS: 40.0000 mg | ORAL_TABLET | Freq: Every day | ORAL | 0 refills | Status: DC

## 2016-09-10 MED ORDER — METHYLPREDNISOLONE SODIUM SUCC 125 MG IJ SOLR
125.0000 mg | Freq: Once | INTRAMUSCULAR | Status: AC
  Administered 2016-09-10: 125 mg via INTRAVENOUS
  Filled 2016-09-10: qty 2

## 2016-09-10 NOTE — Discharge Instructions (Signed)
We'll start a 5 day course of prednisone. Chest x-ray here negative for any pneumonia or congestive heart failure. Symptoms consistent with an exacerbation of COPD. Wheezing completely resolved with nebulizer treatments here. Patient received her first dose of steroids here tonight. Recommend using albuterol inhaler 3 times a day. But have patient avoid going outside due to the pollen. Return for any new or worse symptoms.

## 2016-09-10 NOTE — ED Triage Notes (Signed)
Pt has history of asthma.  Been having increased SOB for 2 days with no relief from breathing treatments at home

## 2016-09-10 NOTE — ED Provider Notes (Signed)
[facility]-EMERGENCY DEPT Provider Note   CSN: [id number] Arrival date & time: 09/10/16  1959  By signing my name below, I, Bing Neighbors., attest that this documentation has been prepared under the direction and in the presence of Vanetta Mulders, MD. Electronically signed: Bing Neighbors., ED Scribe. 09/10/16. 10:31 PM.\   History   Chief Complaint Chief Complaint  Patient presents with  . Shortness of Breath    Level 5 Caveat  HPI Deborah [name] is a 83 y.o. female with hx of asthma, COPD, A-fib, heart failure who presents to the Emergency Department complaining of worsening SOB with onset x2 days. Pt arrives from [facility]. Pt states that she has felt SOB for the past x2 days after doing yard work. She reportedly has had albuterol breathing treatments twice daily with no relief. Pt denies oxygen use at baseline. Of note, pt has DNR paperwork.    The history is provided by the patient. No language interpreter was used.    Past Medical History:  Diagnosis Date  . Alzheimer disease   . Asthma   . Atrial fibrillation (HCC)    Permanent  . Chronic anticoagulation    Xarelto discontinued after fall  . Chronic diastolic heart failure (HCC)   . COPD (chronic obstructive pulmonary disease) (HCC)   . Coronary atherosclerosis of native coronary artery    BMS circumflex 2009  . Drug intolerance    Flecacinide and Amiodarone   . Fracture of multiple pubic rami (HCC) 12/13   Left superior and inferior - following fall  . Hypotension   . Mixed hyperlipidemia   . Retroperitoneal hemorrhage 12/13   Following fall  . Seizures (HCC)    Remote history of over 20 years ago  . Umbilical hernia     Patient Active Problem List   Diagnosis Date Noted  . Hypotension 04/22/2012  . Coronary atherosclerosis of native coronary artery   . COPD (chronic obstructive pulmonary disease) (HCC)   . Mixed hyperlipidemia 05/14/2009  . Permanent atrial fibrillation  (HCC) 02/10/2009  . DIASTOLIC HEART FAILURE, CHRONIC 02/10/2009    Past Surgical History:  Procedure Laterality Date  . CATARACT EXTRACTION    . Radiofrequency catheter ablation     Failed  . Right carpel tunnel release    . TONSILLECTOMY      OB History    No data available       Home Medications    Prior to Admission medications   Medication Sig Start Date End Date Taking? Authorizing Provider  acetaminophen (TYLENOL) 325 MG tablet Take 650 mg by mouth every 6 (six) hours as needed. For pain    Yes Historical Provider, MD  albuterol (PROVENTIL) (2.5 MG/3ML) 0.083% nebulizer solution Take 2.5 mg by nebulization 2 (two) times daily as needed.   Yes Historical Provider, MD  albuterol (VENTOLIN HFA) 108 (90 BASE) MCG/ACT inhaler Inhale 2 puffs into the lungs 4 (four) times daily as needed. Shortness of breath   Yes Historical Provider, MD  alendronate (FOSAMAX) 70 MG tablet Take 70 mg by mouth once a week. Take with a full glass of water on an empty stomach.   Yes Historical Provider, MD  aspirin (ASPIRIN EC) 81 MG EC tablet Take 1 tablet (81 mg total) by mouth daily. Swallow whole. 05/04/12  Yes [name], MD  atorvastatin (LIPITOR) 40 MG tablet Take 40 mg by mouth. Monday, Wednesday, & Friday evening.   Yes Historical Provider, MD  Calcium Carbonate-Vit D-Min (  CALTRATE 600+D PLUS) 600-400 MG-UNIT per tablet Take 1 tablet by mouth 2 (two) times daily.     Yes Historical Provider, MD  digoxin (LANOXIN) 0.25 MG tablet Take 0.025 mg by mouth every evening.   Yes Historical Provider, MD  diltiazem (CARDIZEM) 30 MG tablet Take 15 mg by mouth 2 (two) times daily.   Yes Historical Provider, MD  docusate sodium (COLACE) 100 MG capsule Take 100 mg by mouth at bedtime.   Yes Historical Provider, MD  fexofenadine (ALLEGRA) 180 MG tablet Take 180 mg by mouth as needed.    Yes Historical Provider, MD  furosemide (LASIX) 40 MG tablet Take 20 mg by mouth 2 (two) times daily.    Yes Historical  Provider, MD  levETIRAcetam (KEPPRA) 250 MG tablet Takes one tablet in the morning and half a tablet at 3 pm.   Yes Historical Provider, MD  levothyroxine (SYNTHROID, LEVOTHROID) 25 MCG tablet Take 25 mcg by mouth daily before breakfast.   Yes Historical Provider, MD  midodrine (PROAMATINE) 2.5 MG tablet Take 1 tablet (2.5 mg total) by mouth 2 (two) times daily with a meal. The dosing can be titrated down to once daily when her systolic blood pressure is consistently in the 100s. 05/04/12  Yes [name], MD  Multiple Vitamins-Minerals (ICAPS) CAPS Take 1 capsule by mouth 2 (two) times daily.   Yes Historical Provider, MD  nitroGLYCERIN (NITROSTAT) 0.4 MG SL tablet Place 0.4 mg under the tongue every 5 (five) minutes as needed for chest pain.   Yes Historical Provider, MD  omeprazole (PRILOSEC OTC) 20 MG tablet Take 20 mg by mouth. Monday, Wednesday, & Friday evening.   Yes Historical Provider, MD  potassium chloride (KLOR-CON) 10 MEQ CR tablet Take 10 mEq by mouth daily.     Yes Historical Provider, MD  Probiotic Product (PROBIOTIC DAILY PO) Take 1 tablet by mouth every morning.   Yes Historical Provider, MD  Carboxymethylcellulose Sodium (THERATEARS OP) Place 4 drops into both eyes 4 (four) times daily. As needed    Historical Provider, MD  predniSONE (DELTASONE) 10 MG tablet Take 4 tablets (40 mg total) by mouth daily. 09/10/16   Vanetta Mulders, MD    Family History Family History  Problem Relation Age of Onset  . Coronary artery disease Mother     Social History Social History  Substance Use Topics  . Smoking status: Former Smoker    Packs/day: 0.80    Years: 40.00    Types: Cigarettes    Quit date: 05/23/1990  . Smokeless tobacco: Never Used  . Alcohol use No     Allergies   Ivp dye [iodinated diagnostic agents]; Omnipaque [iohexol]; Sulfa antibiotics; Carbamazepine; Dilaudid [hydromorphone]; and Tramadol   Review of Systems Review of Systems  Unable to perform ROS:  Dementia     Physical Exam Updated Vital Signs BP 138/75   Pulse (!) 125   Temp 98.6 F (37 C) (Oral)   Resp (!) 21   SpO2 100%   Physical Exam  Constitutional: She appears well-developed and well-nourished. No distress.  HENT:  Head: Normocephalic and atraumatic.  Eyes: Conjunctivae and EOM are normal. Pupils are equal, round, and reactive to light.  Pupils normal, sclera clear, eyes track normal.   Neck: Neck supple.  Cardiovascular: Regular rhythm.  Tachycardia present.   No murmur heard. Pulmonary/Chest: Accessory muscle usage present. No respiratory distress. She has wheezes (bilateral ).  Abdominal: Soft. Bowel sounds are normal. There is no tenderness.  Musculoskeletal:  Right lower leg: She exhibits edema.       Left lower leg: She exhibits edema.  Trace edema to bilateral legs.  Neurological: She is alert. No cranial nerve deficit or sensory deficit. She exhibits normal muscle tone. Coordination normal.  Skin: Skin is warm and dry.  Psychiatric: She has a normal mood and affect.  Nursing note and vitals reviewed.    ED Treatments / Results   DIAGNOSTIC STUDIES: Oxygen Saturation is 95% on 2L O2, inadequate by my interpretation.   COORDINATION OF CARE: 10:31 PM-Discussed next steps with pt. Pt verbalized understanding and is agreeable with the plan.    Labs (all labs ordered are listed, but only abnormal results are displayed) Labs Reviewed  CBC WITH DIFFERENTIAL/PLATELET - Abnormal; Notable for the following:       Result Value   Lymphs Abs 0.4 (*)    Monocytes Absolute 1.1 (*)    All other components within normal limits  BRAIN NATRIURETIC PEPTIDE - Abnormal; Notable for the following:    B Natriuretic Peptide 102.0 (*)    All other components within normal limits  COMPREHENSIVE METABOLIC PANEL    EKG  EKG Interpretation  Date/Time:  Saturday September 10 2016 20:06:11 EDT Ventricular Rate:  108 PR Interval:    QRS Duration: 81 QT  Interval:  279 QTC Calculation: 374 R Axis:   70 Text Interpretation:  Atrial fibrillation Ventricular premature complex Repol abnrm, severe global ischemia (LM/MVD) Confirmed by Deretha Emory  MD, Ancel Easler [phone]) on 09/10/2016 8:10:40 PM       Radiology Dg Chest 2 View  Result Date: 09/10/2016 CLINICAL DATA:  Initial evaluation for acute soreness of breath. History of asthma, COPD. EXAM: CHEST  2 VIEW COMPARISON:  Prior radiograph from 04/29/2012. FINDINGS: Cardiomegaly, stable from previous. Mediastinal silhouette within normal limits. Aortic atherosclerosis. In lungs mildly hyperexpanded with underlying changes related COPD. No focal infiltrates. Apparent increased density within the right upper lobe on frontal projection favored to be related to overlying EKG lead. No pulmonary edema or pleural effusion. No pneumothorax. Chronic compression deformities within the mid thoracic spine with exaggeration the normal thoracic kyphosis. No acute osseous abnormality. IMPRESSION: 1. No active cardiopulmonary disease. 2. COPD. 3. Stable cardiomegaly.  No pulmonary edema. 4. Aortic atherosclerosis. Electronically Signed   By: Rise Mu M.D.   On: 09/10/2016 21:08    Procedures Procedures (including critical care time)  Medications Ordered in ED Medications  0.9 %  sodium chloride infusion (not administered)  albuterol (PROVENTIL) (2.5 MG/3ML) 0.083% nebulizer solution 5 mg (5 mg Nebulization Given 09/10/16 2028)  ipratropium-albuterol (DUONEB) 0.5-2.5 (3) MG/3ML nebulizer solution 3 mL (3 mLs Nebulization Given 09/10/16 2048)  methylPREDNISolone sodium succinate (SOLU-MEDROL) 125 mg/2 mL injection 125 mg (125 mg Intravenous Given 09/10/16 2213)     Initial Impression / Assessment and Plan / ED Course  I have reviewed the triage vital signs and the nursing notes.  Pertinent labs & imaging results that were available during my care of the patient were reviewed by me and considered in my medical  decision making (see chart for details).     Patient with a history of COPD and CHF. Patient arrived with some respiratory distress and bilateral wheezing. Patient given 2 breathing treatments here with albuterol and Atrovent and wheezing resolved. Patient also given 125 mg Solu-Medrol.  Chest x-ray negative for pneumonia or CHF. BNP slightly elevated. Patient was significant improvement after the treatments.  Patient is from a nursing facility she is a DO  NOT RESUSCITATE. She has been outside recently maybe aggravation from the pollen.  Recommend she use her albuterol inhaler 3 times a day 5 day course of prednisone and staying inside until the pollen improves.  Patient's daughter is okay with this plan.  Patient's wheezing is completely resolved.  Final Clinical Impressions(s) / ED Diagnoses   Final diagnoses:  COPD exacerbation (HCC)    New Prescriptions New Prescriptions   PREDNISONE (DELTASONE) 10 MG TABLET    Take 4 tablets (40 mg total) by mouth daily.   I personally performed the services described in this documentation, which was scribed in my presence. The recorded information has been reviewed and is accurate.       Vanetta Mulders, MD 09/10/16 2232

## 2016-09-13 ENCOUNTER — Inpatient Hospital Stay (HOSPITAL_COMMUNITY)
Admission: EM | Admit: 2016-09-13 | Discharge: 2016-09-21 | DRG: 190 | Disposition: A | Discharge status: Skilled Nursing Facility | Payer: Medicare Other | Attending: Internal Medicine | Admitting: Internal Medicine

## 2016-09-13 ENCOUNTER — Encounter (HOSPITAL_COMMUNITY): Payer: Self-pay | Admitting: *Deleted

## 2016-09-13 ENCOUNTER — Emergency Department (HOSPITAL_COMMUNITY): Payer: Medicare Other

## 2016-09-13 DIAGNOSIS — I959 Hypotension, unspecified: Secondary | ICD-10-CM | POA: Diagnosis present

## 2016-09-13 DIAGNOSIS — I9589 Other hypotension: Secondary | ICD-10-CM | POA: Diagnosis present

## 2016-09-13 DIAGNOSIS — E782 Mixed hyperlipidemia: Secondary | ICD-10-CM | POA: Diagnosis not present

## 2016-09-13 DIAGNOSIS — D72829 Elevated white blood cell count, unspecified: Secondary | ICD-10-CM | POA: Diagnosis not present

## 2016-09-13 DIAGNOSIS — Z7982 Long term (current) use of aspirin: Secondary | ICD-10-CM

## 2016-09-13 DIAGNOSIS — E860 Dehydration: Secondary | ICD-10-CM | POA: Diagnosis present

## 2016-09-13 DIAGNOSIS — J45901 Unspecified asthma with (acute) exacerbation: Secondary | ICD-10-CM | POA: Diagnosis present

## 2016-09-13 DIAGNOSIS — F0280 Dementia in other diseases classified elsewhere without behavioral disturbance: Secondary | ICD-10-CM | POA: Diagnosis present

## 2016-09-13 DIAGNOSIS — K921 Melena: Secondary | ICD-10-CM | POA: Diagnosis not present

## 2016-09-13 DIAGNOSIS — F419 Anxiety disorder, unspecified: Secondary | ICD-10-CM | POA: Diagnosis present

## 2016-09-13 DIAGNOSIS — E876 Hypokalemia: Secondary | ICD-10-CM | POA: Diagnosis present

## 2016-09-13 DIAGNOSIS — R569 Unspecified convulsions: Secondary | ICD-10-CM

## 2016-09-13 DIAGNOSIS — Z79899 Other long term (current) drug therapy: Secondary | ICD-10-CM

## 2016-09-13 DIAGNOSIS — Z66 Do not resuscitate: Secondary | ICD-10-CM | POA: Diagnosis present

## 2016-09-13 DIAGNOSIS — E039 Hypothyroidism, unspecified: Secondary | ICD-10-CM | POA: Diagnosis present

## 2016-09-13 DIAGNOSIS — G40909 Epilepsy, unspecified, not intractable, without status epilepticus: Secondary | ICD-10-CM | POA: Diagnosis present

## 2016-09-13 DIAGNOSIS — R0902 Hypoxemia: Secondary | ICD-10-CM

## 2016-09-13 DIAGNOSIS — G309 Alzheimer's disease, unspecified: Secondary | ICD-10-CM | POA: Diagnosis present

## 2016-09-13 DIAGNOSIS — E878 Other disorders of electrolyte and fluid balance, not elsewhere classified: Secondary | ICD-10-CM | POA: Diagnosis present

## 2016-09-13 DIAGNOSIS — T50905A Adverse effect of unspecified drugs, medicaments and biological substances, initial encounter: Secondary | ICD-10-CM | POA: Diagnosis not present

## 2016-09-13 DIAGNOSIS — I5032 Chronic diastolic (congestive) heart failure: Secondary | ICD-10-CM | POA: Diagnosis not present

## 2016-09-13 DIAGNOSIS — J9601 Acute respiratory failure with hypoxia: Secondary | ICD-10-CM | POA: Diagnosis present

## 2016-09-13 DIAGNOSIS — J441 Chronic obstructive pulmonary disease with (acute) exacerbation: Secondary | ICD-10-CM | POA: Diagnosis not present

## 2016-09-13 DIAGNOSIS — J9690 Respiratory failure, unspecified, unspecified whether with hypoxia or hypercapnia: Secondary | ICD-10-CM

## 2016-09-13 DIAGNOSIS — I5033 Acute on chronic diastolic (congestive) heart failure: Secondary | ICD-10-CM | POA: Diagnosis present

## 2016-09-13 DIAGNOSIS — G92 Toxic encephalopathy: Secondary | ICD-10-CM | POA: Diagnosis not present

## 2016-09-13 DIAGNOSIS — I2510 Atherosclerotic heart disease of native coronary artery without angina pectoris: Secondary | ICD-10-CM | POA: Diagnosis present

## 2016-09-13 DIAGNOSIS — I4891 Unspecified atrial fibrillation: Secondary | ICD-10-CM | POA: Diagnosis not present

## 2016-09-13 DIAGNOSIS — I110 Hypertensive heart disease with heart failure: Secondary | ICD-10-CM | POA: Diagnosis present

## 2016-09-13 DIAGNOSIS — Z87891 Personal history of nicotine dependence: Secondary | ICD-10-CM

## 2016-09-13 DIAGNOSIS — I482 Chronic atrial fibrillation: Secondary | ICD-10-CM | POA: Diagnosis present

## 2016-09-13 DIAGNOSIS — T380X5A Adverse effect of glucocorticoids and synthetic analogues, initial encounter: Secondary | ICD-10-CM | POA: Diagnosis not present

## 2016-09-13 DIAGNOSIS — J9602 Acute respiratory failure with hypercapnia: Secondary | ICD-10-CM | POA: Diagnosis present

## 2016-09-13 DIAGNOSIS — R0602 Shortness of breath: Secondary | ICD-10-CM | POA: Diagnosis present

## 2016-09-13 LAB — CBC WITH DIFFERENTIAL/PLATELET
BASOS PCT: 0 %
Basophils Absolute: 0 10*3/uL (ref 0.0–0.1)
Eosinophils Absolute: 0 10*3/uL (ref 0.0–0.7)
Eosinophils Relative: 0 %
HEMATOCRIT: 46.9 % — AB (ref 36.0–46.0)
HEMOGLOBIN: 15.3 g/dL — AB (ref 12.0–15.0)
LYMPHS ABS: 0.6 10*3/uL — AB (ref 0.7–4.0)
Lymphocytes Relative: 6 %
MCH: 29.1 pg (ref 26.0–34.0)
MCHC: 32.6 g/dL (ref 30.0–36.0)
MCV: 89.3 fL (ref 78.0–100.0)
MONOS PCT: 15 %
Monocytes Absolute: 1.4 10*3/uL — ABNORMAL HIGH (ref 0.1–1.0)
NEUTROS ABS: 7.5 10*3/uL (ref 1.7–7.7)
NEUTROS PCT: 79 %
Platelets: 179 10*3/uL (ref 150–400)
RBC: 5.25 MIL/uL — ABNORMAL HIGH (ref 3.87–5.11)
RDW: 15.2 % (ref 11.5–15.5)
WBC: 9.5 10*3/uL (ref 4.0–10.5)

## 2016-09-13 LAB — MAGNESIUM: Magnesium: 2.1 mg/dL (ref 1.7–2.4)

## 2016-09-13 LAB — BASIC METABOLIC PANEL
Anion gap: 9 (ref 5–15)
BUN: 12 mg/dL (ref 6–20)
CALCIUM: 9.3 mg/dL (ref 8.9–10.3)
CHLORIDE: 97 mmol/L — AB (ref 101–111)
CO2: 30 mmol/L (ref 22–32)
CREATININE: 0.57 mg/dL (ref 0.44–1.00)
GFR calc non Af Amer: >60 mL/min (ref >60)
GLUCOSE: 105 mg/dL — AB (ref 65–99)
Potassium: 3.4 mmol/L — ABNORMAL LOW (ref 3.5–5.1)
Sodium: 136 mmol/L (ref 135–145)

## 2016-09-13 LAB — TROPONIN I
Troponin I: 0.03 ng/mL (ref <0.03)
Troponin I: <0.03 ng/mL (ref <0.03)
Troponin I: <0.03 ng/mL (ref <0.03)

## 2016-09-13 LAB — DIGOXIN LEVEL: Digoxin Level: 0.5 ng/mL — ABNORMAL LOW (ref 0.8–2.0)

## 2016-09-13 LAB — PHOSPHORUS: PHOSPHORUS: 2.7 mg/dL (ref 2.5–4.6)

## 2016-09-13 LAB — MRSA PCR SCREENING: MRSA BY PCR: NEGATIVE

## 2016-09-13 LAB — BRAIN NATRIURETIC PEPTIDE: B Natriuretic Peptide: 134 pg/mL — ABNORMAL HIGH (ref 0.0–100.0)

## 2016-09-13 MED ORDER — LEVOTHYROXINE SODIUM 25 MCG PO TABS
25.0000 ug | ORAL_TABLET | Freq: Every day | ORAL | Status: DC
  Administered 2016-09-13 – 2016-09-21 (×9): 25 ug via ORAL
  Filled 2016-09-13 (×9): qty 1

## 2016-09-13 MED ORDER — METHYLPREDNISOLONE SODIUM SUCC 125 MG IJ SOLR
125.0000 mg | Freq: Once | INTRAMUSCULAR | Status: AC
Start: 2016-09-13 — End: 2016-09-13
  Administered 2016-09-13: 125 mg via INTRAVENOUS
  Filled 2016-09-13: qty 2

## 2016-09-13 MED ORDER — LEVALBUTEROL HCL 1.25 MG/0.5ML IN NEBU
1.2500 mg | INHALATION_SOLUTION | Freq: Once | RESPIRATORY_TRACT | Status: AC
  Administered 2016-09-13: 1.25 mg via RESPIRATORY_TRACT
  Filled 2016-09-13: qty 0.5

## 2016-09-13 MED ORDER — CALCIUM CARBONATE-VITAMIN D 500-200 MG-UNIT PO TABS
1.0000 | ORAL_TABLET | Freq: Two times a day (BID) | ORAL | Status: DC
Start: 2016-09-13 — End: 2016-09-21
  Administered 2016-09-13 – 2016-09-21 (×16): 1 via ORAL
  Filled 2016-09-13 (×21): qty 1

## 2016-09-13 MED ORDER — SODIUM CHLORIDE 0.9 % IV SOLN
INTRAVENOUS | Status: DC
  Administered 2016-09-13: 20 mL/h via INTRAVENOUS
  Administered 2016-09-18: 500 mL via INTRAVENOUS

## 2016-09-13 MED ORDER — DIGOXIN 0.25 MG/ML IJ SOLN
0.1250 mg | Freq: Once | INTRAMUSCULAR | Status: AC
  Administered 2016-09-13: 0.125 mg via INTRAVENOUS
  Filled 2016-09-13: qty 2

## 2016-09-13 MED ORDER — DIGOXIN 125 MCG PO TABS
0.1250 mg | ORAL_TABLET | Freq: Every evening | ORAL | Status: DC
  Administered 2016-09-14 – 2016-09-20 (×7): 0.125 mg via ORAL
  Filled 2016-09-13 (×7): qty 1

## 2016-09-13 MED ORDER — LORATADINE 10 MG PO TABS
10.0000 mg | ORAL_TABLET | Freq: Every day | ORAL | Status: DC
  Administered 2016-09-13 – 2016-09-21 (×9): 10 mg via ORAL
  Filled 2016-09-13 (×9): qty 1

## 2016-09-13 MED ORDER — LEVALBUTEROL HCL 0.63 MG/3ML IN NEBU
0.6300 mg | INHALATION_SOLUTION | Freq: Once | RESPIRATORY_TRACT | Status: AC
  Administered 2016-09-13: 0.63 mg via RESPIRATORY_TRACT
  Filled 2016-09-13: qty 3

## 2016-09-13 MED ORDER — MIDODRINE HCL 5 MG PO TABS
2.5000 mg | ORAL_TABLET | Freq: Two times a day (BID) | ORAL | Status: DC
  Administered 2016-09-13 – 2016-09-21 (×17): 2.5 mg via ORAL
  Filled 2016-09-13 (×17): qty 1

## 2016-09-13 MED ORDER — LEVETIRACETAM 250 MG PO TABS
125.0000 mg | ORAL_TABLET | Freq: Every day | ORAL | Status: DC
  Administered 2016-09-13 – 2016-09-20 (×8): 125 mg via ORAL
  Filled 2016-09-13 (×8): qty 1

## 2016-09-13 MED ORDER — SODIUM CHLORIDE 0.9 % IV SOLN
INTRAVENOUS | Status: DC
  Administered 2016-09-13: 07:00:00 via INTRAVENOUS

## 2016-09-13 MED ORDER — LEVETIRACETAM 250 MG PO TABS
250.0000 mg | ORAL_TABLET | Freq: Every morning | ORAL | Status: DC
  Administered 2016-09-13 – 2016-09-21 (×9): 250 mg via ORAL
  Filled 2016-09-13 (×9): qty 1

## 2016-09-13 MED ORDER — POTASSIUM CHLORIDE CRYS ER 10 MEQ PO TBCR
10.0000 meq | EXTENDED_RELEASE_TABLET | Freq: Every day | ORAL | Status: DC
  Administered 2016-09-13: 10 meq via ORAL
  Filled 2016-09-13: qty 1

## 2016-09-13 MED ORDER — NITROGLYCERIN 0.4 MG SL SUBL: 0.4000 mg | SUBLINGUAL_TABLET | SUBLINGUAL | Status: DC | PRN

## 2016-09-13 MED ORDER — LORAZEPAM 2 MG/ML IJ SOLN
0.5000 mg | Freq: Four times a day (QID) | INTRAMUSCULAR | Status: DC | PRN
  Administered 2016-09-13 – 2016-09-14 (×2): 0.5 mg via INTRAVENOUS
  Filled 2016-09-13 (×2): qty 1

## 2016-09-13 MED ORDER — OCUVITE-LUTEIN PO CAPS
1.0000 | ORAL_CAPSULE | Freq: Two times a day (BID) | ORAL | Status: DC
  Administered 2016-09-13 – 2016-09-21 (×17): 1 via ORAL
  Filled 2016-09-13 (×17): qty 1

## 2016-09-13 MED ORDER — IPRATROPIUM BROMIDE 0.02 % IN SOLN
0.5000 mg | Freq: Once | RESPIRATORY_TRACT | Status: AC
  Administered 2016-09-13: 0.5 mg via RESPIRATORY_TRACT
  Filled 2016-09-13: qty 2.5

## 2016-09-13 MED ORDER — ATORVASTATIN CALCIUM 40 MG PO TABS
40.0000 mg | ORAL_TABLET | Freq: Every day | ORAL | Status: DC
  Administered 2016-09-13 – 2016-09-20 (×8): 40 mg via ORAL
  Filled 2016-09-13 (×9): qty 1

## 2016-09-13 MED ORDER — METHYLPREDNISOLONE SODIUM SUCC 125 MG IJ SOLR
60.0000 mg | Freq: Four times a day (QID) | INTRAMUSCULAR | Status: DC
  Administered 2016-09-13 – 2016-09-19 (×26): 60 mg via INTRAVENOUS
  Filled 2016-09-13 (×25): qty 2

## 2016-09-13 MED ORDER — POTASSIUM CHLORIDE CRYS ER 20 MEQ PO TBCR
40.0000 meq | EXTENDED_RELEASE_TABLET | Freq: Once | ORAL | Status: AC
  Administered 2016-09-13: 40 meq via ORAL
  Filled 2016-09-13: qty 2

## 2016-09-13 MED ORDER — LEVALBUTEROL HCL 0.63 MG/3ML IN NEBU
0.6300 mg | INHALATION_SOLUTION | Freq: Four times a day (QID) | RESPIRATORY_TRACT | Status: DC
  Administered 2016-09-13 – 2016-09-17 (×18): 0.63 mg via RESPIRATORY_TRACT
  Filled 2016-09-13 (×19): qty 3

## 2016-09-13 MED ORDER — DILTIAZEM HCL 30 MG PO TABS
15.0000 mg | ORAL_TABLET | Freq: Two times a day (BID) | ORAL | Status: DC
  Administered 2016-09-13 – 2016-09-14 (×3): 15 mg via ORAL
  Filled 2016-09-13 (×3): qty 1

## 2016-09-13 MED ORDER — LEVETIRACETAM 250 MG PO TABS: 125.0000 mg | ORAL_TABLET | Freq: Two times a day (BID) | ORAL | Status: DC

## 2016-09-13 MED ORDER — LEVALBUTEROL HCL 0.63 MG/3ML IN NEBU: 0.6300 mg | INHALATION_SOLUTION | Freq: Four times a day (QID) | RESPIRATORY_TRACT | Status: DC | PRN

## 2016-09-13 MED ORDER — POTASSIUM CHLORIDE CRYS ER 20 MEQ PO TBCR: 40.0000 meq | EXTENDED_RELEASE_TABLET | Freq: Two times a day (BID) | ORAL | Status: DC

## 2016-09-13 MED ORDER — ACETAMINOPHEN 325 MG PO TABS
650.0000 mg | ORAL_TABLET | Freq: Four times a day (QID) | ORAL | Status: DC | PRN
  Administered 2016-09-13 – 2016-09-17 (×10): 650 mg via ORAL
  Filled 2016-09-13 (×11): qty 2

## 2016-09-13 MED ORDER — DOCUSATE SODIUM 100 MG PO CAPS
100.0000 mg | ORAL_CAPSULE | Freq: Every day | ORAL | Status: DC
  Administered 2016-09-13 – 2016-09-18 (×6): 100 mg via ORAL
  Filled 2016-09-13 (×6): qty 1

## 2016-09-13 MED ORDER — ASPIRIN EC 81 MG PO TBEC
81.0000 mg | DELAYED_RELEASE_TABLET | Freq: Every day | ORAL | Status: DC
  Administered 2016-09-13 – 2016-09-19 (×7): 81 mg via ORAL
  Filled 2016-09-13 (×7): qty 1

## 2016-09-13 MED ORDER — ENOXAPARIN SODIUM 40 MG/0.4ML ~~LOC~~ SOLN
40.0000 mg | SUBCUTANEOUS | Status: DC
  Administered 2016-09-15 – 2016-09-19 (×5): 40 mg via SUBCUTANEOUS
  Filled 2016-09-13 (×6): qty 0.4

## 2016-09-13 MED ORDER — IPRATROPIUM BROMIDE 0.02 % IN SOLN
0.5000 mg | Freq: Four times a day (QID) | RESPIRATORY_TRACT | Status: DC
  Administered 2016-09-13 – 2016-09-17 (×18): 0.5 mg via RESPIRATORY_TRACT
  Filled 2016-09-13 (×18): qty 2.5

## 2016-09-13 MED ORDER — MAGNESIUM SULFATE 2 GM/50ML IV SOLN
2.0000 g | Freq: Once | INTRAVENOUS | Status: AC
  Administered 2016-09-13: 2 g via INTRAVENOUS
  Filled 2016-09-13: qty 50

## 2016-09-13 NOTE — ED Notes (Signed)
Dr. Ortiz in with pt at this time. 

## 2016-09-13 NOTE — ED Provider Notes (Signed)
[facility]-EM[facility]Y DEPT Provider Note   CSN: [id number] Arrival date & time: 09/13/16  0235     History   Chief Complaint Chief Complaint  Patient presents with  . Shortness of Breath    HPI Deborah [name] is a 83 y.o. female.  83 year old female presents with one-week history of shortness of breath which has progressively gotten worse over the past 24-48 hours. Seen here 3 days ago for similar symptoms and treated for asthma exacerbation and placed on albuterol and prednisone. Tonight became more dyspneic without chest pain or fever. Cough is been productive. Some lower extremity edema noted. No reported vomiting or diarrhea. No abdominal discomfort. sHe has had some increased diffuse weakness. Called EMS and was transported here      Past Medical History:  Diagnosis Date  . Alzheimer disease   . Asthma   . Atrial fibrillation (HCC)    Permanent  . Chronic anticoagulation    Xarelto discontinued after fall  . Chronic diastolic heart failure (HCC)   . COPD (chronic obstructive pulmonary disease) (HCC)   . Coronary atherosclerosis of native coronary artery    BMS circumflex 2009  . Drug intolerance    Flecacinide and Amiodarone   . Fracture of multiple pubic rami (HCC) 12/13   Left superior and inferior - following fall  . Hypotension   . Mixed hyperlipidemia   . Retroperitoneal hemorrhage 12/13   Following fall  . Seizures (HCC)    Remote history of over 20 years ago  . Umbilical hernia     Patient Active Problem List   Diagnosis Date Noted  . Hypotension 04/22/2012  . Coronary atherosclerosis of native coronary artery   . COPD (chronic obstructive pulmonary disease) (HCC)   . Mixed hyperlipidemia 05/14/2009  . Permanent atrial fibrillation (HCC) 02/10/2009  . DIASTOLIC HEART FAILURE, CHRONIC 02/10/2009    Past Surgical History:  Procedure Laterality Date  . CATARACT EXTRACTION    . Radiofrequency catheter ablation     Failed  . Right carpel tunnel release     . TONSILLECTOMY      OB History    No data available       Home Medications    Prior to Admission medications   Medication Sig Start Date End Date Taking? Authorizing Provider  acetaminophen (TYLENOL) 325 MG tablet Take 650 mg by mouth every 6 (six) hours as needed. For pain     Historical Provider, MD  albuterol (PROVENTIL) (2.5 MG/3ML) 0.083% nebulizer solution Take 2.5 mg by nebulization 2 (two) times daily as needed.    Historical Provider, MD  albuterol (VENTOLIN HFA) 108 (90 BASE) MCG/ACT inhaler Inhale 2 puffs into the lungs 4 (four) times daily as needed. Shortness of breath    Historical Provider, MD  alendronate (FOSAMAX) 70 MG tablet Take 70 mg by mouth once a week. Take with a full glass of water on an empty stomach.    Historical Provider, MD  aspirin (ASPIRIN EC) 81 MG EC tablet Take 1 tablet (81 mg total) by mouth daily. Swallow whole. 05/04/12   [name], MD  atorvastatin (LIPITOR) 40 MG tablet Take 40 mg by mouth. Monday, Wednesday, & Friday evening.    Historical Provider, MD  Calcium Carbonate-Vit D-Min (CALTRATE 600+D PLUS) 600-400 MG-UNIT per tablet Take 1 tablet by mouth 2 (two) times daily.      Historical Provider, MD  Carboxymethylcellulose Sodium (THERATEARS OP) Place 4 drops into both eyes 4 (four) times daily. As needed  Historical Provider, MD  digoxin (LANOXIN) 0.25 MG tablet Take 0.025 mg by mouth every evening.    Historical Provider, MD  diltiazem (CARDIZEM) 30 MG tablet Take 15 mg by mouth 2 (two) times daily.    Historical Provider, MD  docusate sodium (COLACE) 100 MG capsule Take 100 mg by mouth at bedtime.    Historical Provider, MD  fexofenadine (ALLEGRA) 180 MG tablet Take 180 mg by mouth as needed.     Historical Provider, MD  furosemide (LASIX) 40 MG tablet Take 20 mg by mouth 2 (two) times daily.     Historical Provider, MD  levETIRAcetam (KEPPRA) 250 MG tablet Takes one tablet in the morning and half a tablet at 3 pm.    Historical  Provider, MD  levothyroxine (SYNTHROID, LEVOTHROID) 25 MCG tablet Take 25 mcg by mouth daily before breakfast.    Historical Provider, MD  midodrine (PROAMATINE) 2.5 MG tablet Take 1 tablet (2.5 mg total) by mouth 2 (two) times daily with a meal. The dosing can be titrated down to once daily when her systolic blood pressure is consistently in the 100s. 05/04/12   [name], MD  Multiple Vitamins-Minerals (ICAPS) CAPS Take 1 capsule by mouth 2 (two) times daily.    Historical Provider, MD  nitroGLYCERIN (NITROSTAT) 0.4 MG SL tablet Place 0.4 mg under the tongue every 5 (five) minutes as needed for chest pain.    Historical Provider, MD  omeprazole (PRILOSEC OTC) 20 MG tablet Take 20 mg by mouth. Monday, Wednesday, & Friday evening.    Historical Provider, MD  potassium chloride (KLOR-CON) 10 MEQ CR tablet Take 10 mEq by mouth daily.      Historical Provider, MD  predniSONE (DELTASONE) 10 MG tablet Take 4 tablets (40 mg total) by mouth daily. 09/10/16   Vanetta Mulders, MD  Probiotic Product (PROBIOTIC DAILY PO) Take 1 tablet by mouth every morning.    Historical Provider, MD    Family History Family History  Problem Relation Age of Onset  . Coronary artery disease Mother     Social History Social History  Substance Use Topics  . Smoking status: Former Smoker    Packs/day: 0.80    Years: 40.00    Types: Cigarettes    Quit date: 05/23/1990  . Smokeless tobacco: Never Used  . Alcohol use No     Allergies   Ivp dye [iodinated diagnostic agents]; Omnipaque [iohexol]; Sulfa antibiotics; Carbamazepine; Dilaudid [hydromorphone]; and Tramadol   Review of Systems Review of Systems  All other systems reviewed and are negative.    Physical Exam Updated Vital Signs BP (!) 125/91 (BP Location: Left Arm)   Pulse (!) 127   Temp 98.9 F (37.2 C) (Oral)   Resp (!) 30   Ht  (1.651 m)   Wt 61.2 kg   SpO2 94%   BMI 22.47 kg/m   Physical Exam  Constitutional: She is oriented to  person, place, and time. She appears well-developed and well-nourished.  Non-toxic appearance. No distress.  HENT:  Head: Normocephalic and atraumatic.  Eyes: Conjunctivae, EOM and lids are normal. Pupils are equal, round, and reactive to light.  Neck: Normal range of motion. Neck supple. No tracheal deviation present. No thyroid mass present.  Cardiovascular: Normal heart sounds.  An irregularly irregular rhythm present. Tachycardia present.  Exam reveals no gallop.   No murmur heard. Pulmonary/Chest: Effort normal. No stridor. No respiratory distress. She has decreased breath sounds in the right lower field and the left lower  field. She has wheezes in the right lower field and the left lower field. She has no rhonchi. She has no rales.  Abdominal: Soft. Normal appearance and bowel sounds are normal. She exhibits no distension. There is no tenderness. There is no rebound and no CVA tenderness.  Musculoskeletal: Normal range of motion. She exhibits no edema or tenderness.  Neurological: She is alert and oriented to person, place, and time. She has normal strength. No cranial nerve deficit or sensory deficit. GCS eye subscore is 4. GCS verbal subscore is 5. GCS motor subscore is 6.  Skin: Skin is warm and dry. No abrasion and no rash noted.  Psychiatric: She has a normal mood and affect. Her speech is normal and behavior is normal.  Nursing note and vitals reviewed.    ED Treatments / Results  Labs (all labs ordered are listed, but only abnormal results are displayed) Labs Reviewed  CBC WITH DIFFERENTIAL/PLATELET  BASIC METABOLIC PANEL  BRAIN NATRIURETIC PEPTIDE  TROPONIN I    EKG  EKG Interpretation  Date/Time:  Tuesday September 13 2016 02:38:47 EDT Ventricular Rate:  129 PR Interval:    QRS Duration: 76 QT Interval:  277 QTC Calculation: 406 R Axis:   78 Text Interpretation:  Atrial fibrillation Repolarization abnormality, prob rate related Confirmed by Augie Vane  MD, Leslie Jester ([id number])  on 09/13/2016 2:59:24 AM       Radiology No results found.  Procedures Procedures (including critical care time)  Medications Ordered in ED Medications  0.9 %  sodium chloride infusion (not administered)  0.9 %  sodium chloride infusion (not administered)  methylPREDNISolone sodium succinate (SOLU-MEDROL) 125 mg/2 mL injection 125 mg (not administered)  ipratropium (ATROVENT) nebulizer solution 0.5 mg (not administered)  levalbuterol (XOPENEX) nebulizer solution 1.25 mg (not administered)     Initial Impression / Assessment and Plan / ED Course  I have reviewed the triage vital signs and the nursing notes.  Pertinent labs & imaging results that were available during my care of the patient were reviewed by me and considered in my medical decision making (see chart for details).     Chest x-ray without acute infiltrate. Patient has failed outpatient therapy for her COPD. Patient noted to be in A. fib with RVR. Her daughter states that she is chronically in this and normally runs at a rate of between 100 up to 120. Treated with Solu-Medrol and albuterol here. Will admit to the hospital  Final Clinical Impressions(s) / ED Diagnoses   Final diagnoses:  None    New Prescriptions New Prescriptions   No medications on file     Lorre Nick, MD 09/13/16 [phone]

## 2016-09-13 NOTE — Progress Notes (Signed)
Patient seen and examined this am.  Reports that she feels like she is so claustrophobic in her small dark room.  She is speaking in full sentences.  She reports that she feels like she is choking and is very anxious.  Ativan ordered.  Daughter is bedside and states patient suffers from dementia.  Agree with assessment and plan by Dr. Robb Matar.  Will continue to monitor closely in the SDU.

## 2016-09-13 NOTE — ED Notes (Signed)
Report given to Robbie RN in ICU 

## 2016-09-13 NOTE — ED Notes (Signed)
Called report, unable to take at this time

## 2016-09-13 NOTE — ED Notes (Signed)
CRITICAL VALUE ALERT  Critical value received:  Troponin 0.03  Date of notification:  09/13/2016  Time of notification:  0357  Critical value read back:Yes.    Nurse who received alert:  Neldon Mc RN  MD notified (1st page):  Dr. Freida Busman  Time of first page:  [phone]  MD notified (2nd page):  Time of second page:  Responding MD:    Time MD responded:

## 2016-09-13 NOTE — ED Notes (Signed)
Pt received duoneb en route to hospital by ems

## 2016-09-13 NOTE — ED Triage Notes (Signed)
Pt c/o sob x 1 week since she went outside

## 2016-09-13 NOTE — Care Management Note (Signed)
Case Management Note  Patient Details  Name: Deborah [name] MRN: [id number] Date of Birth: 03-13-1926  Subjective/Objective:   Adm with COPD exacerbation. From [place]. Currently on 2 liters acutely.                  Action/Plan: Anticipate DC back to ALF when medically stable.    Expected Discharge Date:  09/16/16               Expected Discharge Plan:  Assisted Living / Rest Home  In-House Referral:  Clinical Social Work  Discharge planning Services  CM Consult  Post Acute Care Choice:    Choice offered to:     DME Arranged:    DME Agency:     HH Arranged:    HH Agency:     Status of Service:  Completed, signed off  If discussed at [employer], dates discussed:    Additional Comments:  Deborah [name], Deborah Oiler, RN 09/13/2016, 11:38 AM

## 2016-09-13 NOTE — H&P (Signed)
History and Physical    MACRINA [name] AVW:[id number] DOB: [birth year] DOA: 09/13/2016  PCP: Ignatius Specking, MD   Patient coming from: Home.  I have personally briefly reviewed patient's old medical records in [facility]  Chief Complaint: Shortness of breath.  HPI: Deborah [name] is a 83 y.o. female with medical history significant of Alzheimer's disease, asthma, atrial fibrillation, chronic diastolic heart failure, COPD, CAD, hypertension, mixed hyperlipidemia, mild history of seizures who is returning to the ER due to shortness of breath.  Per patient and her daughter, on Thursday, the patient got exposed to environmental allergens during the day subsequently developing wheezing and dyspnea. She used her MDI and nebulizer without significant relief. She had to come to the emergency department on Saturday for treatment. She was discharged home with oral prednisone. They stated that the patient's symptoms improved during Sunday, but started recurring on Monday and she had to come again to the ED early in the morning today area. She denies fever, chills, but complains of fatigue. Positive pleuritic chest pain with light yellow sputum productive cough. She denies precordial chest pressure, palpitations, dizziness, diaphoresis or recent pitting edema lower extremities. She denies abdominal pain, nausea, emesis, diarrhea or constipation. She denies dysuria frequency or hematuria.  ED Course: The patient was given supplemental oxygen, bronchodilators and Solu-Medrol experiencing significant relief. Her hemoglobin level was 15.3 g/dL, WBC 9.5 and platelets 179. Sodium 136, potassium 3.4, chloride 97 and bicarbonate 30 mmol/L. BUN 12, creatinine 0.57 and glucose 105 mg/dL. Troponin 0.03 ng/mL, BNP 134 pg/mL and EKG show atrial fibrillation with RVR at 129 BPM  Imaging: Her chest radiograph shows hyperinflation and emphysematous changes.  Review of Systems: As per HPI otherwise 10 point review of  systems negative.    Past Medical History:  Diagnosis Date  . Alzheimer disease   . Asthma   . Atrial fibrillation (HCC)    Permanent  . Chronic anticoagulation    Xarelto discontinued after fall  . Chronic diastolic heart failure (HCC)   . COPD (chronic obstructive pulmonary disease) (HCC)   . Coronary atherosclerosis of native coronary artery    BMS circumflex 2009  . Drug intolerance    Flecacinide and Amiodarone   . Fracture of multiple pubic rami (HCC) 12/13   Left superior and inferior - following fall  . Hypotension   . Mixed hyperlipidemia   . Retroperitoneal hemorrhage 12/13   Following fall  . Seizures (HCC)    Remote history of over 20 years ago  . Umbilical hernia     Past Surgical History:  Procedure Laterality Date  . CATARACT EXTRACTION    . Radiofrequency catheter ablation     Failed  . Right carpel tunnel release    . TONSILLECTOMY       reports that she quit smoking about 26 years ago. Her smoking use included Cigarettes. She has a 32.00 pack-year smoking history. She has never used smokeless tobacco. She reports that she does not drink alcohol or use drugs.  Allergies  Allergen Reactions  . Ivp Dye [Iodinated Diagnostic Agents] Shortness Of Breath    ??  . Omnipaque [Iohexol] Shortness Of Breath and Other (See Comments)    Short of breath with chest tightness after IV injection in CT, pt was fine when she left department but developed symptoms in parking lot and went to emergency department.  . Sulfa Antibiotics Shortness Of Breath  . Carbamazepine     REACTION: toxemia  . Dilaudid [Hydromorphone]  Nausea And Vomiting  . Tramadol Other (See Comments)    confusion    Family History  Problem Relation Age of Onset  . Coronary artery disease Mother      Prior to Admission medications   Medication Sig Start Date End Date Taking? Authorizing Provider  acetaminophen (TYLENOL) 325 MG tablet Take 650 mg by mouth every 6 (six) hours as needed. For  pain     Historical Provider, MD  albuterol (PROVENTIL) (2.5 MG/3ML) 0.083% nebulizer solution Take 2.5 mg by nebulization 2 (two) times daily as needed.    Historical Provider, MD  albuterol (VENTOLIN HFA) 108 (90 BASE) MCG/ACT inhaler Inhale 2 puffs into the lungs 4 (four) times daily as needed. Shortness of breath    Historical Provider, MD  alendronate (FOSAMAX) 70 MG tablet Take 70 mg by mouth once a week. Take with a full glass of water on an empty stomach.    Historical Provider, MD  aspirin (ASPIRIN EC) 81 MG EC tablet Take 1 tablet (81 mg total) by mouth daily. Swallow whole. 05/04/12   [name], MD  atorvastatin (LIPITOR) 40 MG tablet Take 40 mg by mouth. Monday, Wednesday, & Friday evening.    Historical Provider, MD  Calcium Carbonate-Vit D-Min (CALTRATE 600+D PLUS) 600-400 MG-UNIT per tablet Take 1 tablet by mouth 2 (two) times daily.      Historical Provider, MD  Carboxymethylcellulose Sodium (THERATEARS OP) Place 4 drops into both eyes 4 (four) times daily. As needed    Historical Provider, MD  digoxin (LANOXIN) 0.25 MG tablet Take 0.025 mg by mouth every evening.    Historical Provider, MD  diltiazem (CARDIZEM) 30 MG tablet Take 15 mg by mouth 2 (two) times daily.    Historical Provider, MD  docusate sodium (COLACE) 100 MG capsule Take 100 mg by mouth at bedtime.    Historical Provider, MD  fexofenadine (ALLEGRA) 180 MG tablet Take 180 mg by mouth as needed.     Historical Provider, MD  furosemide (LASIX) 40 MG tablet Take 20 mg by mouth 2 (two) times daily.     Historical Provider, MD  levETIRAcetam (KEPPRA) 250 MG tablet Takes one tablet in the morning and half a tablet at 3 pm.    Historical Provider, MD  levothyroxine (SYNTHROID, LEVOTHROID) 25 MCG tablet Take 25 mcg by mouth daily before breakfast.    Historical Provider, MD  midodrine (PROAMATINE) 2.5 MG tablet Take 1 tablet (2.5 mg total) by mouth 2 (two) times daily with a meal. The dosing can be titrated down to once  daily when her systolic blood pressure is consistently in the 100s. 05/04/12   [name], MD  Multiple Vitamins-Minerals (ICAPS) CAPS Take 1 capsule by mouth 2 (two) times daily.    Historical Provider, MD  nitroGLYCERIN (NITROSTAT) 0.4 MG SL tablet Place 0.4 mg under the tongue every 5 (five) minutes as needed for chest pain.    Historical Provider, MD  omeprazole (PRILOSEC OTC) 20 MG tablet Take 20 mg by mouth. Monday, Wednesday, & Friday evening.    Historical Provider, MD  potassium chloride (KLOR-CON) 10 MEQ CR tablet Take 10 mEq by mouth daily.      Historical Provider, MD  predniSONE (DELTASONE) 10 MG tablet Take 4 tablets (40 mg total) by mouth daily. 09/10/16   Vanetta Mulders, MD  Probiotic Product (PROBIOTIC DAILY PO) Take 1 tablet by mouth every morning.    Historical Provider, MD    Physical Exam:  Constitutional: NAD, calm, comfortable Vitals:  09/13/16 0237 09/13/16 0244 09/13/16 0314 09/13/16 0328  BP:  (!) 125/91    Pulse:  (!) 127    Resp:  (!) 30    Temp:  98.9 F (37.2 C)    TempSrc:  Oral    SpO2:  94% 93% 100%  Weight: 61.2 kg (135 lb)     Height:  (1.651 m)      Eyes: PERRL, lids and conjunctivae normal ENMT: Mucous membranes are moist. Posterior pharynx clear of any exudate or lesions. Dentures. Neck: normal, supple, no masses, no thyromegaly Respiratory: Decreased breath sounds with wheezing and mild rhonchi bilaterally, no crackles. No accessory muscle use.  Cardiovascular: Irregularly irregular at 122 BPM, no murmurs / rubs / gallops. No extremity edema. 2+ pedal pulses. No carotid bruits.  Abdomen: Soft, no tenderness, no masses palpated. No hepatosplenomegaly. Bowel sounds positive.  Musculoskeletal: no clubbing / cyanosis. Good ROM, no contractures. Normal muscle tone.  Skin: no rashes, lesions, ulcers. No induration Neurologic: CN 2-12 grossly intact. Sensation intact, DTR normal. Strength 5/5 in all 4.  Psychiatric: Normal judgment and  insight. Alert and oriented x 3. Normal mood.     Labs on Admission: I have personally reviewed following labs and imaging studies  CBC:  Recent Labs Lab 09/10/16 2030 09/13/16 0302  WBC 6.3 9.5  NEUTROABS 4.8 7.5  HGB 14.2 15.3*  HCT 44.4 46.9*  MCV 90.4 89.3  PLT 165 179   Basic Metabolic Panel:  Recent Labs Lab 09/10/16 2030 09/13/16 0302  NA 144 136  K 4.2 3.4*  CL 106 97*  CO2 29 30  GLUCOSE 95 105*  BUN 16 12  CREATININE 0.75 0.57  CALCIUM 9.5 9.3   GFR: Estimated Creatinine Clearance: 43.7 mL/min (by C-G formula based on SCr of 0.57 mg/dL). Liver Function Tests:  Recent Labs Lab 09/10/16 2030  AST 28  ALT 19  ALKPHOS 71  BILITOT 0.5  PROT 7.0  ALBUMIN 4.3   No results for input(s): LIPASE, AMYLASE in the last 168 hours. No results for input(s): AMMONIA in the last 168 hours. Coagulation Profile: No results for input(s): INR, PROTIME in the last 168 hours. Cardiac Enzymes:  Recent Labs Lab 09/13/16 0302  TROPONINI 0.03*   BNP (last 3 results) No results for input(s): PROBNP in the last 8760 hours. HbA1C: No results for input(s): HGBA1C in the last 72 hours. CBG: No results for input(s): GLUCAP in the last 168 hours. Lipid Profile: No results for input(s): CHOL, HDL, LDLCALC, TRIG, CHOLHDL, LDLDIRECT in the last 72 hours. Thyroid Function Tests: No results for input(s): TSH, T4TOTAL, FREET4, T3FREE, THYROIDAB in the last 72 hours. Anemia Panel: No results for input(s): VITAMINB12, FOLATE, FERRITIN, TIBC, IRON, RETICCTPCT in the last 72 hours. Urine analysis:    Component Value Date/Time   COLORURINE YELLOW 04/28/2012 1830   APPEARANCEUR CLEAR 04/28/2012 1830   LABSPEC 1.010 04/28/2012 1830   PHURINE 6.5 04/28/2012 1830   GLUCOSEU NEGATIVE 04/28/2012 1830   HGBUR TRACE (A) 04/28/2012 1830   BILIRUBINUR NEGATIVE 04/28/2012 1830   KETONESUR NEGATIVE 04/28/2012 1830   PROTEINUR NEGATIVE 04/28/2012 1830   UROBILINOGEN 0.2 04/28/2012  1830   NITRITE NEGATIVE 04/28/2012 1830   LEUKOCYTESUR NEGATIVE 04/28/2012 1830    Radiological Exams on Admission: Dg Chest Port 1 View  Result Date: 09/13/2016 CLINICAL DATA:  83 year old female with shortness of breath. EXAM: PORTABLE CHEST 1 VIEW COMPARISON:  Chest radiograph dated 09/10/2016 FINDINGS: There is emphysematous changes of the lungs with interstitial  coarsening. No focal consolidation, pleural effusion, or pneumothorax. Top-normal cardiac silhouette. No acute osseous pathology. IMPRESSION: 1. No acute cardiopulmonary process. 2. Emphysema. Electronically Signed   By: Elgie Collard M.D.   On: 09/13/2016 03:11    EKG: Independently reviewed. Vent. rate 129 BPM PR interval * ms QRS duration 76 ms QT/QTc 277/406 ms P-R-T axes * 78 -84 Atrial fibrillation Repolarization abnormality, prob rate related  Assessment/Plan Principal Problem:   COPD with acute exacerbation (HCC) Admit to stepdown/observation. Continue supplemental oxygen. Continue Xopenex every 6 hours and as needed. Continue ipratropium every 6 hours. Continue Solu-Medrol 60 mg IVP every 6 hours. BiPAP ventilation if needed.  Active Problems:   Atrial fibrillation with RVR (HCC) CHA2DS2-VASc Score of at least 4. Not on anticoagulation due to history of severe GI bleed. Digoxin level is currently low. Will order 0.125 mg IVP x1. Continue Cardizem 50 mg by mouth twice a day. Magnesium sulfate 2 g IVPB 1 dose Optimize electrolytes.    Mixed hyperlipidemia Continue atorvastatin 40 mg by mouth daily. Monitor LFTs and lipid panel as an outpatient.    DIASTOLIC HEART FAILURE, CHRONIC Continue furosemide 20 mg by mouth twice a day. Continue potassium supplementation.    Hypotension Continue midodrine 2.5 mg by mouth twice a day.    Alzheimer disease Supportive care.    Seizures (HCC) Remote history of seizures. Continue prophylactic treatment with Keppra.    Hypokalemia Replaced. Follow-up  potassium level. Keep potassium level over 4.0 mmol/L due to A. fib.     DVT prophylaxis: Lovenox SQ. Code Status: Full code. Family Communication: Her daughter Darl Pikes was present in the emergency department. Disposition Plan: Admit for COPD exacerbation treatment, A. fib rate control and electrolyte optimization. Consults called:  Admission status: Observation/telemetry.   Bobette Mo MD [facility] Pager [phone].  If 7PM-7AM, please contact night-coverage [email or web address] Password [facility]  09/13/2016, 4:51 AM

## 2016-09-13 NOTE — Care Management Obs Status (Signed)
MEDICARE OBSERVATION STATUS NOTIFICATION   Patient Details  Name: DENYCE HARR MRN: [id number] Date of Birth: September 05, 1925   Medicare Observation Status Notification Given:  Yes    Orvis Stann, [name], RN 09/13/2016, 11:31 AM

## 2016-09-14 DIAGNOSIS — J441 Chronic obstructive pulmonary disease with (acute) exacerbation: Principal | ICD-10-CM

## 2016-09-14 DIAGNOSIS — I4891 Unspecified atrial fibrillation: Secondary | ICD-10-CM

## 2016-09-14 LAB — BASIC METABOLIC PANEL
ANION GAP: 8 (ref 5–15)
BUN: 16 mg/dL (ref 6–20)
CALCIUM: 9.2 mg/dL (ref 8.9–10.3)
CO2: 29 mmol/L (ref 22–32)
Chloride: 99 mmol/L — ABNORMAL LOW (ref 101–111)
Creatinine, Ser: 0.54 mg/dL (ref 0.44–1.00)
GFR calc non Af Amer: >60 mL/min (ref >60)
GLUCOSE: 126 mg/dL — AB (ref 65–99)
POTASSIUM: 4.7 mmol/L (ref 3.5–5.1)
Sodium: 136 mmol/L (ref 135–145)

## 2016-09-14 MED ORDER — GUAIFENESIN ER 600 MG PO TB12
1200.0000 mg | ORAL_TABLET | Freq: Two times a day (BID) | ORAL | Status: DC
  Administered 2016-09-14 – 2016-09-21 (×15): 1200 mg via ORAL
  Filled 2016-09-14 (×15): qty 2

## 2016-09-14 MED ORDER — DOXYCYCLINE HYCLATE 100 MG PO TABS
100.0000 mg | ORAL_TABLET | Freq: Two times a day (BID) | ORAL | Status: DC
  Administered 2016-09-14 – 2016-09-16 (×6): 100 mg via ORAL
  Filled 2016-09-14 (×6): qty 1

## 2016-09-14 MED ORDER — POTASSIUM CHLORIDE CRYS ER 10 MEQ PO TBCR
10.0000 meq | EXTENDED_RELEASE_TABLET | Freq: Every day | ORAL | Status: DC
  Filled 2016-09-14: qty 1

## 2016-09-14 MED ORDER — FUROSEMIDE 20 MG PO TABS: 20.0000 mg | ORAL_TABLET | Freq: Two times a day (BID) | ORAL | Status: DC

## 2016-09-14 MED ORDER — LORAZEPAM 0.5 MG PO TABS: 0.2500 mg | ORAL_TABLET | Freq: Two times a day (BID) | ORAL | Status: DC | PRN

## 2016-09-14 MED ORDER — DILTIAZEM HCL 30 MG PO TABS
15.0000 mg | ORAL_TABLET | Freq: Three times a day (TID) | ORAL | Status: DC
  Administered 2016-09-14 – 2016-09-15 (×3): 15 mg via ORAL
  Filled 2016-09-14 (×3): qty 1

## 2016-09-14 MED ORDER — FUROSEMIDE 20 MG PO TABS
20.0000 mg | ORAL_TABLET | Freq: Every day | ORAL | Status: DC
  Administered 2016-09-14 – 2016-09-16 (×3): 20 mg via ORAL
  Filled 2016-09-14 (×3): qty 1

## 2016-09-14 MED ORDER — POTASSIUM CHLORIDE CRYS ER 20 MEQ PO TBCR
20.0000 meq | EXTENDED_RELEASE_TABLET | Freq: Every day | ORAL | Status: DC
  Administered 2016-09-14: 20 meq via ORAL
  Filled 2016-09-14: qty 1

## 2016-09-14 NOTE — Consult Note (Signed)
Consult requested by: [facility] Consult requested for: Acute respiratory failure  HPI: This is an 83 year old who has medical problems including asthma/COPD atrial fib chronic diastolic heart failure coronary disease hypertension hyperlipidemia and dementia who came to the emergency room because of shortness of breath. She had been in her usual state of fair health without in the yard at her assisted living facility was exposed to pollen and became extremely short of breath. She's been to the emergency room was treated placed on prednisone but she became worse and came back. Her daughter who is at bedside provides history because the patient has received some Ativan. Her daughter says that although she is still struggling she is better than yesterday. She's coughing nonproductively. No other new complaints. She has atrial fib in her atrial fib has not been as well controlled. No chest pain. No overt symptoms of heart failure.  Past Medical History:  Diagnosis Date  . Alzheimer disease   . Asthma   . Atrial fibrillation (HCC)    Permanent  . Chronic anticoagulation    Xarelto discontinued after fall  . Chronic diastolic heart failure (HCC)   . COPD (chronic obstructive pulmonary disease) (HCC)   . Coronary atherosclerosis of native coronary artery    BMS circumflex 2009  . Drug intolerance    Flecacinide and Amiodarone   . Fracture of multiple pubic rami (HCC) 12/13   Left superior and inferior - following fall  . Hypotension   . Mixed hyperlipidemia   . Retroperitoneal hemorrhage 12/13   Following fall  . Seizures (HCC)    Remote history of over 20 years ago  . Umbilical hernia      Family History  Problem Relation Age of Onset  . Coronary artery disease Mother      Social History   Social History  . Marital status: Widowed    Spouse name: N/A  . Number of children: N/A  . Years of education: N/A   Social History Main Topics  . Smoking status: Former Smoker   Packs/day: 0.80    Years: 40.00    Types: Cigarettes    Quit date: 05/23/1990  . Smokeless tobacco: Never Used  . Alcohol use No  . Drug use: No  . Sexual activity: No   Other Topics Concern  . None   Social History Narrative   Widowed.      ROS: Except as mentioned 10 point review of systems is negative    Objective: Vital signs in last 24 hours: Temp:  [97.4 F (36.3 C)-98.6 F (37 C)] 97.6 F (36.4 C) (04/25 0755) Pulse Rate:  [45-136] 116 (04/25 0800) Resp:  [16-28] 18 (04/25 0211) BP: (76-128)/(64-98) 121/98 (04/25 0800) SpO2:  [94 %-99 %] 97 % (04/25 0848) Weight:  [65 kg (143 lb 4.8 oz)] 65 kg (143 lb 4.8 oz) (04/25 0500) Weight change: 3.764 kg (8 lb 4.8 oz) Last BM Date: 09/12/16  Intake/Output from previous day: 04/24 0701 - 04/25 0700 In: 1718 [P.O.:1040; I.V.:678] Out: 900 [Urine:900]  PHYSICAL EXAM Constitutional: She is awake and alert and in some respiratory distress. Eyes: Pupils react. EOMI. Ears nose mouth and throat: Mucous membranes are moist. She is somewhat hard of hearing. Cardiovascular: She has atrial fib with heart rate about 120-140. Respiratory: Her respiratory effort is increased. She is using accessory muscles. She has bilateral rhonchi and wheezing. Gastrointestinal: Her abdomen is soft with no masses. Skin: Warm and dry. Neurological: She is mildly confused. Psychiatric: Not really able  to assess musculoskeletal: Grossly normal strength  Lab Results: Basic Metabolic Panel:  Recent Labs  16/10/96 0302 09/14/16 0814  NA 136 136  K 3.4* 4.7  CL 97* 99*  CO2 30 29  GLUCOSE 105* 126*  BUN 12 16  CREATININE 0.57 0.54  CALCIUM 9.3 9.2  MG 2.1  --   PHOS 2.7  --    Liver Function Tests: No results for input(s): AST, ALT, ALKPHOS, BILITOT, PROT, ALBUMIN in the last 72 hours. No results for input(s): LIPASE, AMYLASE in the last 72 hours. No results for input(s): AMMONIA in the last 72 hours. CBC:  Recent Labs  09/13/16 0302  WBC  9.5  NEUTROABS 7.5  HGB 15.3*  HCT 46.9*  MCV 89.3  PLT 179   Cardiac Enzymes:  Recent Labs  09/13/16 0302 09/13/16 0851 09/13/16 1506  TROPONINI 0.03* <0.03 <0.03   BNP: No results for input(s): PROBNP in the last 72 hours. D-Dimer: No results for input(s): DDIMER in the last 72 hours. CBG: No results for input(s): GLUCAP in the last 72 hours. Hemoglobin A1C: No results for input(s): HGBA1C in the last 72 hours. Fasting Lipid Panel: No results for input(s): CHOL, HDL, LDLCALC, TRIG, CHOLHDL, LDLDIRECT in the last 72 hours. Thyroid Function Tests: No results for input(s): TSH, T4TOTAL, FREET4, T3FREE, THYROIDAB in the last 72 hours. Anemia Panel: No results for input(s): VITAMINB12, FOLATE, FERRITIN, TIBC, IRON, RETICCTPCT in the last 72 hours. Coagulation: No results for input(s): LABPROT, INR in the last 72 hours. Urine Drug Screen: Drugs of Abuse  No results found for: LABOPIA, COCAINSCRNUR, LABBENZ, AMPHETMU, THCU, LABBARB  Alcohol Level: No results for input(s): ETH in the last 72 hours. Urinalysis: No results for input(s): COLORURINE, LABSPEC, PHURINE, GLUCOSEU, HGBUR, BILIRUBINUR, KETONESUR, PROTEINUR, UROBILINOGEN, NITRITE, LEUKOCYTESUR in the last 72 hours.  Invalid input(s): APPERANCEUR Misc. Labs:   ABGS: No results for input(s): PHART, PO2ART, TCO2, HCO3 in the last 72 hours.  Invalid input(s): PCO2   MICROBIOLOGY: Recent Results (from the past 240 hour(s))  MRSA PCR Screening     Status: None   Collection Time: 09/13/16  6:15 AM  Result Value Ref Range Status   MRSA by PCR NEGATIVE NEGATIVE Final    Comment:        The GeneXpert MRSA Assay (FDA approved for NASAL specimens only), is one component of a comprehensive MRSA colonization surveillance program. It is not intended to diagnose MRSA infection nor to guide or monitor treatment for MRSA infections.     Studies/Results: Dg Chest Port 1 View  Result Date: 09/13/2016 CLINICAL  DATA:  83 year old female with shortness of breath. EXAM: PORTABLE CHEST 1 VIEW COMPARISON:  Chest radiograph dated 09/10/2016 FINDINGS: There is emphysematous changes of the lungs with interstitial coarsening. No focal consolidation, pleural effusion, or pneumothorax. Top-normal cardiac silhouette. No acute osseous pathology. IMPRESSION: 1. No acute cardiopulmonary process. 2. Emphysema. Electronically Signed   By: Elgie Collard M.D.   On: 09/13/2016 03:11    Medications:  Prior to Admission:  Prescriptions Prior to Admission  Medication Sig Dispense Refill Last Dose  . acetaminophen (TYLENOL) 500 MG tablet Take 500 mg by mouth every 6 (six) hours as needed for mild pain. For pain    09/11/2016  . albuterol (PROVENTIL) (2.5 MG/3ML) 0.083% nebulizer solution Take 2.5 mg by nebulization 2 (two) times daily as needed.   09/12/2016 at Unknown time  . alendronate (FOSAMAX) 70 MG tablet Take 70 mg by mouth once a week. Take with  a full glass of water on an empty stomach.   09/07/2016  . aspirin (ASPIRIN EC) 81 MG EC tablet Take 1 tablet (81 mg total) by mouth daily. Swallow whole.   09/12/2016 at Unknown time  . atorvastatin (LIPITOR) 40 MG tablet Take 40 mg by mouth. Monday, Wednesday, & Friday evening.   09/12/2016 at Unknown time  . Calcium Carbonate-Vit D-Min (CALTRATE 600+D PLUS) 600-400 MG-UNIT per tablet Take 1 tablet by mouth 2 (two) times daily.     09/12/2016 at Unknown time  . digoxin (LANOXIN) 0.25 MG tablet Take 0.025 mg by mouth every evening.   09/12/2016 at Unknown time  . diltiazem (CARDIZEM) 30 MG tablet Take 15 mg by mouth 2 (two) times daily.   09/12/2016 at Unknown time  . docusate sodium (COLACE) 100 MG capsule Take 100 mg by mouth at bedtime.   09/11/2016 at Unknown time  . furosemide (LASIX) 40 MG tablet Take 10 mg by mouth 2 (two) times daily.    09/12/2016 at Unknown time  . levETIRAcetam (KEPPRA) 250 MG tablet Takes one tablet in the morning and half a tablet at 3 pm.   09/12/2016 at  Unknown time  . levothyroxine (SYNTHROID, LEVOTHROID) 25 MCG tablet Take 25 mcg by mouth daily before breakfast.   09/12/2016 at Unknown time  . midodrine (PROAMATINE) 2.5 MG tablet Take 1 tablet (2.5 mg total) by mouth 2 (two) times daily with a meal. The dosing can be titrated down to once daily when her systolic blood pressure is consistently in the 100s.   09/12/2016 at Unknown time  . Multiple Vitamins-Minerals (ICAPS) CAPS Take 1 capsule by mouth 2 (two) times daily.   09/12/2016 at Unknown time  . nitroGLYCERIN (NITROSTAT) 0.4 MG SL tablet Place 0.4 mg under the tongue every 5 (five) minutes as needed for chest pain.   unknown  . omeprazole (PRILOSEC OTC) 20 MG tablet Take 20 mg by mouth. Monday, Wednesday, & Friday evening.   09/11/2016  . potassium chloride (KLOR-CON) 10 MEQ CR tablet Take 10 mEq by mouth daily.     09/12/2016 at Unknown time  . predniSONE (DELTASONE) 10 MG tablet Take 4 tablets (40 mg total) by mouth daily. 20 tablet 0 09/11/2016  . Probiotic Product (PROBIOTIC DAILY PO) Take 1 tablet by mouth every morning.   09/11/2016   Scheduled: . aspirin EC  81 mg Oral Daily  . atorvastatin  40 mg Oral q1800  . calcium-vitamin D  1 tablet Oral BID  . digoxin  0.125 mg Oral QPM  . diltiazem  15 mg Oral BID  . docusate sodium  100 mg Oral QHS  . doxycycline  100 mg Oral Q12H  . enoxaparin (LOVENOX) injection  40 mg Subcutaneous Q24H  . furosemide  20 mg Oral Daily  . guaiFENesin  1,200 mg Oral BID  . ipratropium  0.5 mg Nebulization Q6H  . levalbuterol  0.63 mg Nebulization Q6H  . levETIRAcetam  125 mg Oral Q1500  . levETIRAcetam  250 mg Oral q morning - 10a  . levothyroxine  25 mcg Oral QAC breakfast  . loratadine  10 mg Oral Daily  . methylPREDNISolone (SOLU-MEDROL) injection  60 mg Intravenous Q6H  . midodrine  2.5 mg Oral BID WC  . multivitamin-lutein  1 capsule Oral BID  . potassium chloride  20 mEq Oral Daily   Continuous: . sodium chloride 20 mL/hr at 09/13/16 0648  .  sodium chloride 20 mL/hr (09/13/16 0332)   VHQ:[id number], levalbuterol, LORazepam,  nitroGLYCERIN  Assesment:She was admitted with asthma/COPD exacerbation. She is better but still having significant increased work of breathing. She is on IV steroids. She is on inhaled bronchodilators. I wonder if she has some heart failure that may be causing some of the proPrincipal Problem:   COPD with acute exacerbation (HCC) Active Problems:   Mixed hyperlipidemia   DIASTOLIC HEART FAILURE, CHRONIC   Hypotension   Alzheimer disease   Seizures (HCC)   Hypokalemia   Atrial fibrillation with RVR (HCC)   COPD exacerbation (HCC)    Plan: Continue current treatments. I ordered flutter valve and Mucinex. I would plan to increase diltiazem to see if we can get better control of her heart rate.  I don't think she needs BiPAP now.   LOS: 1 day   Ceciley Buist L 09/14/2016, 9:27 AM

## 2016-09-14 NOTE — Consult Note (Signed)
Cardiology Consultation   Patient ID: Deborah [name]; [id number]; 19-May-1926   Admit date: 09/13/2016 Date of Consult: 09/14/2016  Referring MD: [facility] Primary Cardiologist: Dr. Diona Browner Consulting Cardiologist: Dr. Diona Browner  Patient Care Team: Ignatius Specking, MD as PCP - General (Internal Medicine) Jonelle Sidle, MD (Cardiology)    Reason for Consultation: Afib with RVR   History of Present Illness: Deborah [name] is an 83 y.o. female with a hx of CAF controlled with Cardizem 15 mg BID and lanoxin 0.125 mg daily at home b/c of chronic hypotension. Intolerant to Amio and Flecainide.No Anticoagulation b/c of falls, retroperitoneal hemorrhage, low BP and declining vision. Also CAD S/P BMS Cfx 2009, COPD, Alzheimer.  Admitted now with COPD exacerbation and has had Atrial fib with RVR in this setting.  Patient's daughter in room to give most of history. Patient is groggy from sedation last night. Sudden onset of dyspnea and wheezing after working in garden at [place] where she has lived since Sept. No cardiac complaints of chest pain, palpitations, edema, dizziness or presyncope PTA.  Past Medical History:  Diagnosis Date  . Alzheimer disease   . Asthma   . Atrial fibrillation (HCC)    Permanent  . Chronic anticoagulation    Xarelto discontinued after fall  . Chronic diastolic heart failure (HCC)   . COPD (chronic obstructive pulmonary disease) (HCC)   . Coronary atherosclerosis of native coronary artery    BMS circumflex 2009  . Drug intolerance    Flecacinide and Amiodarone   . Fracture of multiple pubic rami (HCC) 12/13   Left superior and inferior - following fall  . Hypotension   . Mixed hyperlipidemia   . Retroperitoneal hemorrhage 12/13   Following fall  . Seizures (HCC)    Remote history of over 20 years ago  . Umbilical hernia     Past Surgical History:  Procedure Laterality Date  . CATARACT EXTRACTION    . Radiofrequency catheter ablation     Failed   . Right carpel tunnel release    . TONSILLECTOMY        Home Meds: Prior to Admission medications   Medication Sig Start Date End Date Taking? Authorizing Provider  acetaminophen (TYLENOL) 500 MG tablet Take 500 mg by mouth every 6 (six) hours as needed for mild pain. For pain    Yes Historical Provider, MD  albuterol (PROVENTIL) (2.5 MG/3ML) 0.083% nebulizer solution Take 2.5 mg by nebulization 2 (two) times daily as needed.   Yes Historical Provider, MD  alendronate (FOSAMAX) 70 MG tablet Take 70 mg by mouth once a week. Take with a full glass of water on an empty stomach.   Yes Historical Provider, MD  aspirin (ASPIRIN EC) 81 MG EC tablet Take 1 tablet (81 mg total) by mouth daily. Swallow whole. 05/04/12  Yes [name], MD  atorvastatin (LIPITOR) 40 MG tablet Take 40 mg by mouth. Monday, Wednesday, & Friday evening.   Yes Historical Provider, MD  Calcium Carbonate-Vit D-Min (CALTRATE 600+D PLUS) 600-400 MG-UNIT per tablet Take 1 tablet by mouth 2 (two) times daily.     Yes Historical Provider, MD  digoxin (LANOXIN) 0.25 MG tablet Take 0.025 mg by mouth every evening.   Yes Historical Provider, MD  diltiazem (CARDIZEM) 30 MG tablet Take 15 mg by mouth 2 (two) times daily.   Yes Historical Provider, MD  docusate sodium (COLACE) 100 MG capsule Take 100 mg by mouth at bedtime.   Yes Historical Provider, MD  furosemide (  LASIX) 40 MG tablet Take 10 mg by mouth 2 (two) times daily.    Yes Historical Provider, MD  levETIRAcetam (KEPPRA) 250 MG tablet Takes one tablet in the morning and half a tablet at 3 pm.   Yes Historical Provider, MD  levothyroxine (SYNTHROID, LEVOTHROID) 25 MCG tablet Take 25 mcg by mouth daily before breakfast.   Yes Historical Provider, MD  midodrine (PROAMATINE) 2.5 MG tablet Take 1 tablet (2.5 mg total) by mouth 2 (two) times daily with a meal. The dosing can be titrated down to once daily when her systolic blood pressure is consistently in the 100s. 05/04/12  Yes  [name], MD  Multiple Vitamins-Minerals (ICAPS) CAPS Take 1 capsule by mouth 2 (two) times daily.   Yes Historical Provider, MD  nitroGLYCERIN (NITROSTAT) 0.4 MG SL tablet Place 0.4 mg under the tongue every 5 (five) minutes as needed for chest pain.   Yes Historical Provider, MD  omeprazole (PRILOSEC OTC) 20 MG tablet Take 20 mg by mouth. Monday, Wednesday, & Friday evening.   Yes Historical Provider, MD  potassium chloride (KLOR-CON) 10 MEQ CR tablet Take 10 mEq by mouth daily.     Yes Historical Provider, MD  predniSONE (DELTASONE) 10 MG tablet Take 4 tablets (40 mg total) by mouth daily. 09/10/16  Yes Vanetta Mulders, MD  Probiotic Product (PROBIOTIC DAILY PO) Take 1 tablet by mouth every morning.   Yes Historical Provider, MD    Current Medications: . aspirin EC  81 mg Oral Daily  . atorvastatin  40 mg Oral q1800  . calcium-vitamin D  1 tablet Oral BID  . digoxin  0.125 mg Oral QPM  . diltiazem  15 mg Oral Q8H  . docusate sodium  100 mg Oral QHS  . doxycycline  100 mg Oral Q12H  . enoxaparin (LOVENOX) injection  40 mg Subcutaneous Q24H  . furosemide  20 mg Oral Daily  . guaiFENesin  1,200 mg Oral BID  . ipratropium  0.5 mg Nebulization Q6H  . levalbuterol  0.63 mg Nebulization Q6H  . levETIRAcetam  125 mg Oral Q1500  . levETIRAcetam  250 mg Oral q morning - 10a  . levothyroxine  25 mcg Oral QAC breakfast  . loratadine  10 mg Oral Daily  . methylPREDNISolone (SOLU-MEDROL) injection  60 mg Intravenous Q6H  . midodrine  2.5 mg Oral BID WC  . multivitamin-lutein  1 capsule Oral BID  . [START ON 09/15/2016] potassium chloride  10 mEq Oral Daily     Allergies:    Allergies  Allergen Reactions  . Ivp Dye [Iodinated Diagnostic Agents] Shortness Of Breath    ??  . Omnipaque [Iohexol] Shortness Of Breath and Other (See Comments)    Short of breath with chest tightness after IV injection in CT, pt was fine when she left department but developed symptoms in parking lot and went to  emergency department.  . Sulfa Antibiotics Shortness Of Breath  . Carbamazepine     REACTION: toxemia  . Dilaudid [Hydromorphone] Nausea And Vomiting  . Tramadol Other (See Comments)    confusion    Social History:   The patient  reports that she quit smoking about 26 years ago. Her smoking use included Cigarettes. She has a 32.00 pack-year smoking history. She has never used smokeless tobacco. She reports that she does not drink alcohol or use drugs.    Family History:   The patient's family history includes Coronary artery disease in her mother.   ROS:  Please  see the history of present illness.  Review of Systems  Reason unable to perform ROS: Alheimer's. patient in and out of confusion. most of history from daughter.  Constitution: Negative.  HENT: Negative.   Eyes: Negative.   Cardiovascular: Negative.   Respiratory: Negative.   Hematologic/Lymphatic: Negative.   Musculoskeletal: Negative.  Negative for joint pain.  Gastrointestinal: Negative.   Genitourinary: Negative.   Neurological: Negative.    All other ROS reviewed and negative.      Vital Signs: Blood pressure 126/86, pulse (!) 112, temperature 97.6 F (36.4 C), temperature source Oral, resp. rate (!) 27, height 5\' 5"  (1.651 m), weight 143 lb 4.8 oz (65 kg), SpO2 99 %.   PHYSICAL EXAM: General:  Well nourished, well developed, wheezing and using accessory muscles HEENT: normal Lymph: no adenopathy Neck: no JVD Endocrine:  No thryomegaly Vascular: No carotid bruits; FA pulses 2+ bilaterally without bruits  Cardiac:  irreg irreg;distant HS secondary to pulm sounds. normal S1, S2; no murmur, rub, bruit, thrill, or heave Lungs:  Diffuse wheezing and rhonchi Abd: soft, nontender, no hepatomegaly  Ext: no edema, Good distal pulses bilaterally Musculoskeletal:  No deformities, BUE and BLE strength normal and equal Skin: warm and dry  Neuro:  CNs 2-12 intact, no focal abnormalities noted Psych:  Normal affect     EKG:  Atrial fibrillation at 129/m with nonspecific ST changes. personally reviewed  Telemetry: Atrial fib 120-140's personally reviewed  Labs:  Recent Labs  09/13/16 0302 09/13/16 0851 09/13/16 1506  TROPONINI 0.03* <0.03 <0.03   Lab Results  Component Value Date   WBC 9.5 09/13/2016   HGB 15.3 (H) 09/13/2016   HCT 46.9 (H) 09/13/2016   MCV 89.3 09/13/2016   PLT 179 09/13/2016    Recent Labs Lab 09/10/16 2030  09/14/16 0814  NA 144  < > 136  K 4.2  < > 4.7  CL 106  < > 99*  CO2 29  < > 29  BUN 16  < > 16  CREATININE 0.75  < > 0.54  CALCIUM 9.5  < > 9.2  PROT 7.0  --   --   BILITOT 0.5  --   --   ALKPHOS 71  --   --   ALT 19  --   --   AST 28  --   --   GLUCOSE 95  < > 126*  < > = values in this interval not displayed. Lab Results  Component Value Date   CHOL  04/25/2009    193        ATP III CLASSIFICATION:  <200     mg/dL   Desirable  161-096  mg/dL   Borderline High  >=045    mg/dL   High          HDL 60 04/25/2009   LDLCALC (H) 04/25/2009    106        Total Cholesterol/HDL:CHD Risk Coronary Heart Disease Risk Table                     Men   Women  1/2 Average Risk   3.4   3.3  Average Risk       5.0   4.4  2 X Average Risk   9.6   7.1  3 X Average Risk  23.4   11.0        Use the calculated Patient Ratio above and the CHD Risk Table to determine the patient's CHD  Risk.        ATP III CLASSIFICATION (LDL):  <100     mg/dL   Optimal  161-096  mg/dL   Near or Above                    Optimal  130-159  mg/dL   Borderline  045-409  mg/dL   High  >811     mg/dL   Very High   TRIG 914 04/25/2009   No results found for: DDIMER  Radiology/Studies:  Dg Chest 2 View  Result Date: 09/10/2016 CLINICAL DATA:  Initial evaluation for acute soreness of breath. History of asthma, COPD. EXAM: CHEST  2 VIEW COMPARISON:  Prior radiograph from 04/29/2012. FINDINGS: Cardiomegaly, stable from previous. Mediastinal silhouette within normal limits.  Aortic atherosclerosis. In lungs mildly hyperexpanded with underlying changes related COPD. No focal infiltrates. Apparent increased density within the right upper lobe on frontal projection favored to be related to overlying EKG lead. No pulmonary edema or pleural effusion. No pneumothorax. Chronic compression deformities within the mid thoracic spine with exaggeration the normal thoracic kyphosis. No acute osseous abnormality. IMPRESSION: 1. No active cardiopulmonary disease. 2. COPD. 3. Stable cardiomegaly.  No pulmonary edema. 4. Aortic atherosclerosis. Electronically Signed   By: Rise Mu M.D.   On: 09/10/2016 21:08   Dg Chest Port 1 View  Result Date: 09/13/2016 CLINICAL DATA:  83 year old female with shortness of breath. EXAM: PORTABLE CHEST 1 VIEW COMPARISON:  Chest radiograph dated 09/10/2016 FINDINGS: There is emphysematous changes of the lungs with interstitial coarsening. No focal consolidation, pleural effusion, or pneumothorax. Top-normal cardiac silhouette. No acute osseous pathology. IMPRESSION: 1. No acute cardiopulmonary process. 2. Emphysema. Electronically Signed   By: Elgie Collard M.D.   On: 09/13/2016 03:11  2Decho 2013 Study Conclusions  - Left ventricle: The cavity size was normal. There was mild   concentric hypertrophy. Systolic function was normal. The   estimated ejection fraction was in the range of 60% to   65%. Wall motion was normal; there were no regional wall   motion abnormalities. - Aortic valve: Mildly calcified annulus. Mildly calcified   leaflets. - Mitral valve: Calcified annulus. - Left atrium: The atrium was moderately dilated. - Right atrium: The atrium was mildly to moderately dilated. - Atrial septum: No defect or patent foramen ovale was   identified. Transthoracic echocardiography.  M-mode, complete 2D, spectral Doppler, and color Doppler.  Height:  Height: 162.6cm. Height: 64in.  Weight:  Weight: 63.5kg. Weight: 139.7lb.  Body  mass index:  BMI: 24kg/m^2.  Body surface area:    BSA: 1.7m^2.  Patient status:  Inpatient. Location:  ICU/CCU    PROBLEM LIST:  Principal Problem:   COPD with acute exacerbation (HCC) Active Problems:   Mixed hyperlipidemia   DIASTOLIC HEART FAILURE, CHRONIC   Hypotension   Alzheimer disease   Seizures (HCC)   Hypokalemia   Atrial fibrillation with RVR (HCC)   COPD exacerbation (HCC)     ASSESSMENT AND PLAN:  COPD Exacerbation on IV Solumedrol  Atrial fibrillation with RVR on cardizem 15 mg BID and lanoxin 0.125 mg daily as outpatient b/c of hypotension. No anticoag secondary to falls and retroperitoneal hemorrhage. Intolerant to Amio and Flecainide. Rate will be difficult to control until COPD exacerbation improves. Still with very low BP at times but mostly at night. ? Try to give an extra dose of Cardizem during her awake hrs. Already on TID but rates fast.  Chronic hypotension BP 113 now  but 76/67 and 83/67 last night.  CAD S/P BMS Cfx 2009, no angina  Alzheimers   Signed, Jacolyn Reedy, PA-C  09/14/2016 9:52 AM    Attending note:  Patient seen and examined. I reviewed her chart, she was last seen in February 2017. Case discussed with Ms. Geni Bers PA-C. Ms. Blandino presents with significant COPD exacerbation in the setting of recent pollen exposure and being outdoors in the garden at [place]. She is being managed on the hospitalist team with input from Dr. Juanetta Gosling as well, currently on steroids, nebulizer treatments, and antibiotics. She has had elevated heart rates in atrial fibrillation in this setting. Baseline history includes chronic hypotension requiring proamatine, use of low-dose Cardizem 15 mg twice daily in addition to digoxin for heart rate control which is generally effective as an outpatient, no long-term anticoagulation as discussed above. She also has prior intolerance to amiodarone and flecainide.  On examination she is somewhat somnolent this morning,  got sedation overnight. Still has a significant degree of chest congestion and rhonchi with expiratory wheezing. Telemetry shows atrial fibrillation, heart rates range from the 90s up to the 120s. Troponin I levels are negative. Chest x-ray shows no infiltrates.  Chronic atrial fibrillation, recent RVR noted in the setting of COPD exacerbation with respiratory failure. Optimal heart rate control treatment is inhibited by chronic hypotension. She has prior intolerance to amiodarone as well. Recommend changing Cardizem to 15 mg every 8 hours and continue Lanoxin. Hopefully as her respiratory status improves, heart rate control will be less of an issue.  Jonelle Sidle, M.D., F.A.C.C.

## 2016-09-14 NOTE — Progress Notes (Addendum)
PROGRESS NOTE    Deborah [name]  ZOX:[id number] DOB: November 18, 1925 DOA: 09/13/2016 PCP: Ignatius Specking, MD    Brief Narrative:  Deborah [name] is a 83 y.o. female with medical history significant of Alzheimer's disease, asthma, atrial fibrillation, chronic diastolic heart failure, COPD, CAD, hypertension, mixed hyperlipidemia, mild history of seizures who is returning to the ER due to shortness of breath.  Per patient and her daughter, on Thursday, the patient got exposed to environmental allergens during the day subsequently developing wheezing and dyspnea. She used her MDI and nebulizer without significant relief. She had to come to the emergency department on Saturday for treatment. She was discharged home with oral prednisone. Symptoms improved but subsequently got worse and presented again to ED.   Assessment & Plan:   Principal Problem:   COPD with acute exacerbation (HCC) Active Problems:   Mixed hyperlipidemia   DIASTOLIC HEART FAILURE, CHRONIC   Hypotension   Alzheimer disease   Seizures (HCC)   Hypokalemia   Atrial fibrillation with RVR (HCC)   COPD exacerbation (HCC)  1-Acute COPD exacerbation; Acute hypoxic respiratory failure.  Secondary to environmental factors.  Continue with Xopenex, Ipratropium.  On IV solumedrol.  BIPAP PRN.  Still using accessory muscle, breathing improve per daughter.  Will consult Pulmonary  Resume oral lasix.  Will add doxycycline for copd flare.   2-A fib RVR Continue with digoxin and Cardizem. Will change Cardizem to TID.  Will inform Dr Diona Browner of patient admission.   3-Chronic Diastolic HF  Resume oral lasix.   4-hyperlipidemia; Continue with Lipitor.   5-Chronic hypotension;  Continue with midodrine.   6-Alzheimer;  Was started on ativan yesterday for anxiety, patient is confuse today.  Will change ativan to oral and careful with oversedation. Would use only if really needed.   7-Seizure;  Continue with keppra.    8-Hypothyroidism;  Continue with synthroid.   Acute encephalopathy; related to medication, ativan.  Will reduce dose.      DVT prophylaxis: lovenox Code Status: DNR Family Communication: dayghter who was at bedside.  Disposition Plan:  Remain in the step down unit    Consultants:   none   Procedures: none   Antimicrobials:   none   Subjective: Patient pleasantly confused. Thought she was at jail last night. She feels confuse.  Per daughter patient is breathing better. She has been using accessory muscle since Friday, and uses accessory muscle while sleep at baseline/   Objective: Vitals:   09/14/16 0211 09/14/16 0300 09/14/16 0400 09/14/16 0500  BP:  103/64    Pulse: 93 90    Resp: 18     Temp:   97.8 F (36.6 C)   TempSrc:   Oral   SpO2: 96% 96%    Weight:    65 kg (143 lb 4.8 oz)  Height:        Intake/Output Summary (Last 24 hours) at 09/14/16 0708 Last data filed at 09/14/16 0356  Gross per 24 hour  Intake             1718 ml  Output              900 ml  Net              818 ml   Filed Weights   09/13/16 0237 09/13/16 0627 09/14/16 0500  Weight: 61.2 kg (135 lb) 65 kg (143 lb 4.8 oz) 65 kg (143 lb 4.8 oz)    Examination:  General exam: Appears  calm and comfortable  Respiratory system: Bilateral wheezing, crackles, using accessory muscle to breath.  Cardiovascular system: I RR S1 & S2 heard. positive JVD, murmurs, rubs, gallops or clicks. No pedal edema. Gastrointestinal system: Abdomen is nondistended, soft and nontender. No organomegaly or masses felt. Normal bowel sounds heard. Central nervous system: Alert and confuse. Moves all 4 extremities.  Extremities: Symmetric 5 x 5 power. Skin: No rashes, lesions or ulcers     Data Reviewed: I have personally reviewed following labs and imaging studies  CBC:  Recent Labs Lab 09/10/16 2030 09/13/16 0302  WBC 6.3 9.5  NEUTROABS 4.8 7.5  HGB 14.2 15.3*  HCT 44.4 46.9*  MCV 90.4 89.3   PLT 165 179   Basic Metabolic Panel:  Recent Labs Lab 09/10/16 2030 09/13/16 0302  NA 144 136  K 4.2 3.4*  CL 106 97*  CO2 29 30  GLUCOSE 95 105*  BUN 16 12  CREATININE 0.75 0.57  CALCIUM 9.5 9.3  MG  --  2.1  PHOS  --  2.7   GFR: Estimated Creatinine Clearance: 43.7 mL/min (by C-G formula based on SCr of 0.57 mg/dL). Liver Function Tests:  Recent Labs Lab 09/10/16 2030  AST 28  ALT 19  ALKPHOS 71  BILITOT 0.5  PROT 7.0  ALBUMIN 4.3   No results for input(s): LIPASE, AMYLASE in the last 168 hours. No results for input(s): AMMONIA in the last 168 hours. Coagulation Profile: No results for input(s): INR, PROTIME in the last 168 hours. Cardiac Enzymes:  Recent Labs Lab 09/13/16 0302 09/13/16 0851 09/13/16 1506  TROPONINI 0.03* <0.03 <0.03   BNP (last 3 results) No results for input(s): PROBNP in the last 8760 hours. HbA1C: No results for input(s): HGBA1C in the last 72 hours. CBG: No results for input(s): GLUCAP in the last 168 hours. Lipid Profile: No results for input(s): CHOL, HDL, LDLCALC, TRIG, CHOLHDL, LDLDIRECT in the last 72 hours. Thyroid Function Tests: No results for input(s): TSH, T4TOTAL, FREET4, T3FREE, THYROIDAB in the last 72 hours. Anemia Panel: No results for input(s): VITAMINB12, FOLATE, FERRITIN, TIBC, IRON, RETICCTPCT in the last 72 hours. Sepsis Labs: No results for input(s): PROCALCITON, LATICACIDVEN in the last 168 hours.  Recent Results (from the past 240 hour(s))  MRSA PCR Screening     Status: None   Collection Time: 09/13/16  6:15 AM  Result Value Ref Range Status   MRSA by PCR NEGATIVE NEGATIVE Final    Comment:        The GeneXpert MRSA Assay (FDA approved for NASAL specimens only), is one component of a comprehensive MRSA colonization surveillance program. It is not intended to diagnose MRSA infection nor to guide or monitor treatment for MRSA infections.          Radiology Studies: Dg Chest Port 1  View  Result Date: 09/13/2016 CLINICAL DATA:  83 year old female with shortness of breath. EXAM: PORTABLE CHEST 1 VIEW COMPARISON:  Chest radiograph dated 09/10/2016 FINDINGS: There is emphysematous changes of the lungs with interstitial coarsening. No focal consolidation, pleural effusion, or pneumothorax. Top-normal cardiac silhouette. No acute osseous pathology. IMPRESSION: 1. No acute cardiopulmonary process. 2. Emphysema. Electronically Signed   By: Elgie Collard M.D.   On: 09/13/2016 03:11        Scheduled Meds: . aspirin EC  81 mg Oral Daily  . atorvastatin  40 mg Oral q1800  . calcium-vitamin D  1 tablet Oral BID  . digoxin  0.125 mg Oral QPM  . diltiazem  15 mg Oral BID  . docusate sodium  100 mg Oral QHS  . enoxaparin (LOVENOX) injection  40 mg Subcutaneous Q24H  . ipratropium  0.5 mg Nebulization Q6H  . levalbuterol  0.63 mg Nebulization Q6H  . levETIRAcetam  125 mg Oral Q1500  . levETIRAcetam  250 mg Oral q morning - 10a  . levothyroxine  25 mcg Oral QAC breakfast  . loratadine  10 mg Oral Daily  . methylPREDNISolone (SOLU-MEDROL) injection  60 mg Intravenous Q6H  . midodrine  2.5 mg Oral BID WC  . multivitamin-lutein  1 capsule Oral BID  . potassium chloride  10 mEq Oral Daily   Continuous Infusions: . sodium chloride 20 mL/hr at 09/13/16 0648  . sodium chloride 20 mL/hr (09/13/16 0332)     LOS: 1 day    Time spent: 35 minutes.     Alba Cory, MD [facility] Pager [phone]  If 7PM-7AM, please contact night-coverage [email or web address] Password [email or web address] 09/14/2016, 7:08 AM

## 2016-09-14 NOTE — Clinical Social Work Note (Signed)
Late entry for 09/13/16 Clinical Social Work Assessment  Patient Details  Name: Deborah [name] MRN: [id number] Date of Birth: 05/10/1926  Date of referral:  09/13/16               Reason for consult:  Discharge Planning                Permission sought to share information with:    Permission granted to share information::     Name::        Agency::     Relationship::     Contact Information:  Patient's daughter, Lonn Georgia, was at bedside.  Housing/Transportation Living arrangements for the past 2 months:  Single Family Home Source of Information:  Adult Children Patient Interpreter Needed:  None Criminal Activity/Legal Involvement Pertinent to Current Situation/Hospitalization:    Significant Relationships:  Adult Children Lives with:  Facility Resident Do you feel safe going back to the place where you live?  Yes Need for family participation in patient care:  Yes (Comment)  Care giving concerns: Facility resident. None identified by patient's family.    Social Worker assessment / plan:  Patient has been a resident at [facility], since September, 2017.  She receives supervision in the shower and staff cuts her food. Patient feeds herself, ambulates with a cane, dresses herself, and toilets herself. Patient has episodes of incontinence of bladder and bowels at times.  Patient's family plan on her returning at discharge.    09/14/16: Tammy at [facility] confirmed statedments. She advised that patient can return at discharge.    Employment status:  Retired Health and safety inspector:  Medicare PT Recommendations:  Not assessed at this time Information / Referral to community resources:     Patient/Family's Response to care:  Family is agreeable for patient to return to facility at discharge.   Patient/Family's Understanding of and Emotional Response to Diagnosis, Current Treatment, and Prognosis:  Family understands patient's diagnosis, treatment and prognosis.    Emotional Assessment Appearance:  Appears stated age Attitude/Demeanor/Rapport:  Unable to Assess Affect (typically observed):  Unable to Assess Orientation:  Oriented to Self Alcohol / Substance use:  Not Applicable Psych involvement (Current and /or in the community):  No (Comment)  Discharge Needs  Concerns to be addressed:  No discharge needs identified Readmission within the last 30 days:  No Current discharge risk:  None Barriers to Discharge:  No Barriers Identified   Annice Needy, LCSW 09/14/2016, 9:07 AM

## 2016-09-14 NOTE — Progress Notes (Signed)
Initial Nutrition Assessment  DOCUMENTATION CODES:   Not applicable  INTERVENTION:  Ensure Enlive po BID, each supplement provides 350 kcal and 20 grams of protein   Recommend Liberalized diet  Agree with MVI   NUTRITION DIAGNOSIS:   Inadequate oral intake related to acute illness (asthma) as evidenced by per patient/family report.   GOAL:   Patient will meet greater than or equal to 90% of their needs   MONITOR:  Po intake, supplement acceptance, labs and wt changes    REASON FOR ASSESSMENT:   Consult COPD Protocol  ASSESSMENT: Deborah [name] is from [place] where she has been since last September per daughter. Her hx includes dementia, Chronic CHF, COPD, CAD and HTN. She presents with shortness of breath and COPD exacerbation.  The patient appetite poor with onset of SOB. She was sedated mostly yesterday when first visited and today pt is awake and up out of bed. Daughter says pt ate very well last night and this morning for breakfast meal intake shows 50-75% which good but may not meet her estimated needs. The patient likes Ensure and RD will add to help meet nutrition requirements.   Her weight hx is stable 63-66 kg.  She does not meet criteria for malnutrition at this time although at risk associated with her chronic dementia, COPD and CHF diseases.   Recent Labs Lab 09/10/16 2030 09/13/16 0302 09/14/16 0814  NA 144 136 136  K 4.2 3.4* 4.7  CL 106 97* 99*  CO2 BUN CREATININE 0.75 0.57 0.54  CALCIUM 9.5 9.3 9.2  MG  --  2.1  --   PHOS  --  2.7  --   GLUCOSE 95 105* 126*   Labs/Meds: reviewed.  Nutrition-Focused physical exam findings: WDL    Diet Order:  Diet Heart Room service appropriate? Yes; Fluid consistency: Thin  Skin:  Reviewed, no issues  Last BM:  4/23  Height:   Ht Readings from Last 1 Encounters:  09/13/16  (1.651 m)    Weight:   Wt Readings from Last 1 Encounters:  09/14/16 143 lb 4.8 oz (65 kg)     Ideal Body Weight:  57 kg  BMI:  Body mass index is 23.85 kg/m.  Estimated Nutritional Needs:   Kcal:  1625-1820 (25-28 kcal/kg)  Protein:  78-85 gr  (1.2-1.3 gr/kg)  Fluid: 1.6 liter daily   EDUCATION NEEDS:   No education needs identified at this time (talked with daughter -the pt has dementia and was sedated on initial visit)  Royann Shivers Deborah,RD,CSG,LDN Office: [phone] Pager: [phone]

## 2016-09-15 DIAGNOSIS — I959 Hypotension, unspecified: Secondary | ICD-10-CM

## 2016-09-15 LAB — CBC
HCT: 46.7 % — ABNORMAL HIGH (ref 36.0–46.0)
HEMOGLOBIN: 14.9 g/dL (ref 12.0–15.0)
MCH: 28.8 pg (ref 26.0–34.0)
MCHC: 31.9 g/dL (ref 30.0–36.0)
MCV: 90.2 fL (ref 78.0–100.0)
Platelets: 164 10*3/uL (ref 150–400)
RBC: 5.18 MIL/uL — AB (ref 3.87–5.11)
RDW: 15.3 % (ref 11.5–15.5)
WBC: 20.2 10*3/uL — AB (ref 4.0–10.5)

## 2016-09-15 LAB — BASIC METABOLIC PANEL
Anion gap: 8 (ref 5–15)
BUN: 19 mg/dL (ref 6–20)
CHLORIDE: 99 mmol/L — AB (ref 101–111)
CO2: 30 mmol/L (ref 22–32)
Calcium: 9.4 mg/dL (ref 8.9–10.3)
Creatinine, Ser: 0.53 mg/dL (ref 0.44–1.00)
GFR calc Af Amer: >60 mL/min (ref >60)
GFR calc non Af Amer: >60 mL/min (ref >60)
Glucose, Bld: 127 mg/dL — ABNORMAL HIGH (ref 65–99)
Potassium: 4.6 mmol/L (ref 3.5–5.1)
SODIUM: 137 mmol/L (ref 135–145)

## 2016-09-15 MED ORDER — HALOPERIDOL LACTATE 5 MG/ML IJ SOLN
1.0000 mg | Freq: Once | INTRAMUSCULAR | Status: AC
  Administered 2016-09-15: 1 mg via INTRAVENOUS
  Filled 2016-09-15: qty 1

## 2016-09-15 MED ORDER — DILTIAZEM HCL 30 MG PO TABS
15.0000 mg | ORAL_TABLET | Freq: Four times a day (QID) | ORAL | Status: DC
  Administered 2016-09-15 – 2016-09-16 (×4): 15 mg via ORAL
  Filled 2016-09-15 (×4): qty 1

## 2016-09-15 MED ORDER — SENNA 8.6 MG PO TABS
1.0000 | ORAL_TABLET | Freq: Every day | ORAL | Status: DC
  Administered 2016-09-15 – 2016-09-18 (×4): 8.6 mg via ORAL
  Filled 2016-09-15 (×4): qty 1

## 2016-09-15 NOTE — Progress Notes (Signed)
PROGRESS NOTE    Deborah [name]  ZOX:[id number] DOB: 03/16/1926 DOA: 09/13/2016 PCP: Ignatius Specking, MD    Brief Narrative:  Deborah [name] is a 83 y.o. female with medical history significant of Alzheimer's disease, asthma, atrial fibrillation, chronic diastolic heart failure, COPD, CAD, hypertension, mixed hyperlipidemia, mild history of seizures who is returning to the ER due to shortness of breath.  Per patient and her daughter, on Thursday, the patient got exposed to environmental allergens during the day subsequently developing wheezing and dyspnea. She used her MDI and nebulizer without significant relief. She had to come to the emergency department on Saturday for treatment. She was discharged home with oral prednisone. Symptoms improved but subsequently got worse and presented again to ED.   Assessment & Plan:   Principal Problem:   COPD with acute exacerbation (HCC) Active Problems:   Mixed hyperlipidemia   DIASTOLIC HEART FAILURE, CHRONIC   Hypotension   Alzheimer disease   Seizures (HCC)   Hypokalemia   Atrial fibrillation with RVR (HCC)   COPD exacerbation (HCC)  1-Acute COPD exacerbation; Acute hypoxic respiratory failure.  Secondary to environmental factors.  Continue with Xopenex, Ipratropium.  On IV solumedrol.  Resume oral lasix.  Doxycycline started 4-25  for copd flare.  Appreciate Dr Juanetta Gosling help.  Doing better today, not using accessory muscle to breath.  Continue with current treatment.   2-A fib RVR Continue with digoxin and Cardizem. Cardizem change  to QID  4-26.  Appreciate Dr Diona Browner help.   3-Chronic Diastolic HF  Continue with  lasix.   4-hyperlipidemia; Continue with Lipitor.   5-Chronic hypotension;  Continue with midodrine.   6-Alzheimer;  Was started on ativan yesterday for anxiety, patient became  confuse after. Marland Kitchen  Avoid ativan.  Alert and oriented, improved.   7-Seizure;  Continue with keppra.   8-Hypothyroidism;  Continue  with synthroid.   Acute encephalopathy; related to medication, ativan.  Discontinue ativan.  Improved.   Leukocytosis;  Suspect related to steroids.  If spike fever will need repeat chest x ray, blood cultures, and broad antibiotics.   DVT prophylaxis: lovenox Code Status: DNR Family Communication: daughter who was at bedside.  Disposition Plan:  Remain in the step down unit    Consultants:   Cardiology  Pulmonology    Procedures: none   Antimicrobials:   Doxycycline 4-25   Subjective: She is feeling much better, she is able to tell me that she is less confused, she is aware know of what's going on.  Breathing much better. She has been using Flutter valve.  Daughter notice breathing is less labor.    Objective: Vitals:   09/15/16 0345 09/15/16 0400 09/15/16 0500 09/15/16 0714  BP: 123/81 (!) 117/96    Pulse: (!) 116 (!) 114    Resp: (!) 25 (!) 23    Temp:  99.5 F (37.5 C)    TempSrc:  Oral    SpO2: (!) 88% 93%  97%  Weight:   65 kg (143 lb 4.8 oz)   Height:        Intake/Output Summary (Last 24 hours) at 09/15/16 0742 Last data filed at 09/15/16 0500  Gross per 24 hour  Intake             1195 ml  Output             1100 ml  Net               95 ml  Filed Weights   09/13/16 0627 09/14/16 0500 09/15/16 0500  Weight: 65 kg (143 lb 4.8 oz) 65 kg (143 lb 4.8 oz) 65 kg (143 lb 4.8 oz)    Examination:  General exam;NAD Respiratory system: Bilateral wheezing, crackles, using less  accessory muscle to breath.  Cardiovascular system: I RR S1 & S2 heard. positive JVD, murmurs, rubs, gallops or clicks. No pedal edema. Gastrointestinal system: Abdomen is nondistended, soft and nontender. No organomegaly or masses felt. Normal bowel sounds heard. Central nervous system: Alert and confuse. Moves all 4 extremities.  Extremities: Symmetric 5 x 5 power. Skin: No rashes, lesions or ulcers     Data Reviewed: I have personally reviewed following labs and  imaging studies  CBC:  Recent Labs Lab 09/10/16 2030 09/13/16 0302 09/15/16 0440  WBC 6.3 9.5 20.2*  NEUTROABS 4.8 7.5  --   HGB 14.2 15.3* 14.9  HCT 44.4 46.9* 46.7*  MCV 90.4 89.3 90.2  PLT 165 179 164   Basic Metabolic Panel:  Recent Labs Lab 09/10/16 2030 09/13/16 0302 09/14/16 0814 09/15/16 0440  NA 144 136 136 137  K 4.2 3.4* 4.7 4.6  CL 106 97* 99* 99*  CO2 GLUCOSE 95 105* 126* 127*  BUN CREATININE 0.75 0.57 0.54 0.53  CALCIUM 9.5 9.3 9.2 9.4  MG  --  2.1  --   --   PHOS  --  2.7  --   --    GFR: Estimated Creatinine Clearance: 43.7 mL/min (by C-G formula based on SCr of 0.53 mg/dL). Liver Function Tests:  Recent Labs Lab 09/10/16 2030  AST 28  ALT 19  ALKPHOS 71  BILITOT 0.5  PROT 7.0  ALBUMIN 4.3   No results for input(s): LIPASE, AMYLASE in the last 168 hours. No results for input(s): AMMONIA in the last 168 hours. Coagulation Profile: No results for input(s): INR, PROTIME in the last 168 hours. Cardiac Enzymes:  Recent Labs Lab 09/13/16 0302 09/13/16 0851 09/13/16 1506  TROPONINI 0.03* <0.03 <0.03   BNP (last 3 results) No results for input(s): PROBNP in the last 8760 hours. HbA1C: No results for input(s): HGBA1C in the last 72 hours. CBG: No results for input(s): GLUCAP in the last 168 hours. Lipid Profile: No results for input(s): CHOL, HDL, LDLCALC, TRIG, CHOLHDL, LDLDIRECT in the last 72 hours. Thyroid Function Tests: No results for input(s): TSH, T4TOTAL, FREET4, T3FREE, THYROIDAB in the last 72 hours. Anemia Panel: No results for input(s): VITAMINB12, FOLATE, FERRITIN, TIBC, IRON, RETICCTPCT in the last 72 hours. Sepsis Labs: No results for input(s): PROCALCITON, LATICACIDVEN in the last 168 hours.  Recent Results (from the past 240 hour(s))  MRSA PCR Screening     Status: None   Collection Time: 09/13/16  6:15 AM  Result Value Ref Range Status   MRSA by PCR NEGATIVE NEGATIVE Final    Comment:         The GeneXpert MRSA Assay (FDA approved for NASAL specimens only), is one component of a comprehensive MRSA colonization surveillance program. It is not intended to diagnose MRSA infection nor to guide or monitor treatment for MRSA infections.          Radiology Studies: No results found.      Scheduled Meds: . aspirin EC  81 mg Oral Daily  . atorvastatin  40 mg Oral q1800  . calcium-vitamin D  1 tablet Oral BID  . digoxin  0.125 mg Oral QPM  .  diltiazem  15 mg Oral Q6H  . docusate sodium  100 mg Oral QHS  . doxycycline  100 mg Oral Q12H  . enoxaparin (LOVENOX) injection  40 mg Subcutaneous Q24H  . furosemide  20 mg Oral Daily  . guaiFENesin  1,200 mg Oral BID  . ipratropium  0.5 mg Nebulization Q6H  . levalbuterol  0.63 mg Nebulization Q6H  . levETIRAcetam  125 mg Oral Q1500  . levETIRAcetam  250 mg Oral q morning - 10a  . levothyroxine  25 mcg Oral QAC breakfast  . loratadine  10 mg Oral Daily  . methylPREDNISolone (SOLU-MEDROL) injection  60 mg Intravenous Q6H  . midodrine  2.5 mg Oral BID WC  . multivitamin-lutein  1 capsule Oral BID  . potassium chloride  10 mEq Oral Daily   Continuous Infusions: . sodium chloride 20 mL/hr at 09/13/16 0648  . sodium chloride Stopped (09/14/16 2059)     LOS: 2 days    Time spent: 35 minutes.     Deborah Cory, MD [facility] Pager [phone]  If 7PM-7AM, please contact night-coverage [email or web address] Password [facility] 09/15/2016, 7:42 AM

## 2016-09-15 NOTE — Progress Notes (Signed)
Progress Note  Patient Name: Deborah [name] Date of Encounter: 09/15/2016  Primary Cardiologist: Dr. Jonelle Sidle  Subjective   Feels perhaps a little better today but still with coughing and shortness of breath at rest. No chest pain or palpitations.  Inpatient Medications    Scheduled Meds: . aspirin EC  81 mg Oral Daily  . atorvastatin  40 mg Oral q1800  . calcium-vitamin D  1 tablet Oral BID  . digoxin  0.125 mg Oral QPM  . diltiazem  15 mg Oral Q6H  . docusate sodium  100 mg Oral QHS  . doxycycline  100 mg Oral Q12H  . enoxaparin (LOVENOX) injection  40 mg Subcutaneous Q24H  . furosemide  20 mg Oral Daily  . guaiFENesin  1,200 mg Oral BID  . ipratropium  0.5 mg Nebulization Q6H  . levalbuterol  0.63 mg Nebulization Q6H  . levETIRAcetam  125 mg Oral Q1500  . levETIRAcetam  250 mg Oral q morning - 10a  . levothyroxine  25 mcg Oral QAC breakfast  . loratadine  10 mg Oral Daily  . methylPREDNISolone (SOLU-MEDROL) injection  60 mg Intravenous Q6H  . midodrine  2.5 mg Oral BID WC  . multivitamin-lutein  1 capsule Oral BID  . senna  1 tablet Oral Daily   Continuous Infusions: . sodium chloride 20 mL/hr at 09/13/16 0648  . sodium chloride Stopped (09/14/16 2059)   PRN Meds: acetaminophen, levalbuterol, nitroGLYCERIN   Vital Signs    Vitals:   09/15/16 0500 09/15/16 0714 09/15/16 0750 09/15/16 0755  BP:      Pulse:   (!) 120   Resp:   (!) 23   Temp:    98 F (36.7 C)  TempSrc:    Oral  SpO2:  97% 97%   Weight: 143 lb 4.8 oz (65 kg)     Height:        Intake/Output Summary (Last 24 hours) at 09/15/16 0907 Last data filed at 09/15/16 0500  Gross per 24 hour  Intake              715 ml  Output             1100 ml  Net             -385 ml   Filed Weights   09/13/16 0627 09/14/16 0500 09/15/16 0500  Weight: 143 lb 4.8 oz (65 kg) 143 lb 4.8 oz (65 kg) 143 lb 4.8 oz (65 kg)    Telemetry    Atrial fibrillation, currently heart rate in the 120s.  Personally reviewed.  Physical Exam   GEN: Elderly woman, no acute distress.   Neck: No JVD. Cardiac:  Irregularly irregular, no gallop.  Respiratory:  Scattered rhonchi with expiratory wheezes. GI: Soft, nontender, bowel sounds present. MS: No edema; No deformity.  Labs    Chemistry  Recent Labs Lab 09/10/16 2030 09/13/16 0302 09/14/16 0814 09/15/16 0440  NA 144 136 136 137  K 4.2 3.4* 4.7 4.6  CL 106 97* 99* 99*  CO2 GLUCOSE 95 105* 126* 127*  BUN CREATININE 0.75 0.57 0.54 0.53  CALCIUM 9.5 9.3 9.2 9.4  PROT 7.0  --   --   --   ALBUMIN 4.3  --   --   --   AST 28  --   --   --   ALT 19  --   --   --  ALKPHOS 71  --   --   --   BILITOT 0.5  --   --   --   GFRNONAA >60 >60 >60 >60  GFRAA >60 >60 >60 >60  ANIONGAP Hematology  Recent Labs Lab 09/10/16 2030 09/13/16 0302 09/15/16 0440  WBC 6.3 9.5 20.2*  RBC 4.91 5.25* 5.18*  HGB 14.2 15.3* 14.9  HCT 44.4 46.9* 46.7*  MCV 90.4 89.3 90.2  MCH 28.9 29.1 28.8  MCHC 32.0 32.6 31.9  RDW 15.4 15.2 15.3  PLT 165 179 164    Cardiac Enzymes  Recent Labs Lab 09/13/16 0302 09/13/16 0851 09/13/16 1506  TROPONINI 0.03* <0.03 <0.03   No results for input(s): TROPIPOC in the last 168 hours.   BNP  Recent Labs Lab 09/10/16 2030 09/13/16 0302  BNP 102.0* 134.0*     Radiology    Chest x-ray 09/13/2016: FINDINGS: There is emphysematous changes of the lungs with interstitial coarsening. No focal consolidation, pleural effusion, or pneumothorax. Top-normal cardiac silhouette. No acute osseous pathology.  IMPRESSION: 1. No acute cardiopulmonary process. 2. Emphysema.  Patient Profile     83 y.o. female with a history of chronic atrial fibrillation (not anticoagulated due to fall risk and prior history of hemorrhage), chronic hypotension requiring midodrine, previous intolerance to amiodarone and flecainide, also symptomatically stable CAD. She is currently  admitted to the hospital with respiratory failure in the setting of COPD exacerbation. Atrial fibrillation heart rate has been elevated in this setting.  Assessment & Plan    1. Chronic atrial fibrillation with recent RVR in the setting of COPD exacerbation. History of chronic hypotension on midodrine has limited high-dose rate control medications as an outpatient, and generally she did well on Cardizem at 15 mg twice daily. This dose has been uptitrated during her current hospital stay.  2. COPD exacerbation. She is being managed by Dr. Juanetta Gosling, currently on steroids, antibiotics, and bronchodilators. Still not at baseline.  3. Chronic hypotension, on midodrine.  Medical regimen at this time includes Lanoxin and Cardizem which has been uptitrated to 15 mg every 6 hours. Unlikely to achieve optimal heart rate control in the acute setting with active pulmonary illness. Blood pressure will limit further up titration of Cardizem most likely.  Signed, Nona Dell, MD  09/15/2016, 9:07 AM

## 2016-09-15 NOTE — Progress Notes (Signed)
Patient is very agitated and restless. Yelling out for help constantly and trying to get out of bed. Patient is fixated on a previous UTI that she has had. She is confused and thinks she is in a basement. RN constantly reorienting patient, and RN sat with patient to help her calm down. Pt is calm with RN in the room, but still having disorganized conversation.   Patient stated that she is tired and would like to rest. Paged for a 1x dose of Haldol. Pt given  of Haldol and is calming down.  HR remains in 120's.  Will continue to monitor  Genelle Bal, RN

## 2016-09-15 NOTE — Progress Notes (Signed)
Subjective: She feels better today. She is more confused. She has done less cough and wheeze overnight. No new complaints. No chest pain nausea vomiting diarrhea.  Objective: Vital signs in last 24 hours: Temp:  [97.4 F (36.3 C)-99.5 F (37.5 C)] 98 F (36.7 C) (04/26 0755) Pulse Rate:  [49-127] 120 (04/26 0750) Resp:  [12-27] 23 (04/26 0750) BP: (71-126)/(56-97) 117/96 (04/26 0400) SpO2:  [88 %-99 %] 97 % (04/26 0750) Weight:  [65 kg (143 lb 4.8 oz)] 65 kg (143 lb 4.8 oz) (04/26 0500) Weight change: 0 kg (0 lb) Last BM Date: 09/12/16  Intake/Output from previous day: 04/25 0701 - 04/26 0700 In: 1195 [P.O.:360; I.V.:835] Out: 1100 [Urine:1100]  PHYSICAL EXAM General appearance: alert, cooperative and Mildly confused Resp: She has bilateral rhonchi but her chest is much clearer. No wheezing now. Less use of accessory muscles Cardio: irregularly irregular rhythm GI: soft, non-tender; bowel sounds normal; no masses,  no organomegaly Extremities: extremities normal, atraumatic, no cyanosis or edema Skin warm and dry.  Lab Results:  Results for orders placed or performed during the hospital encounter of 09/13/16 (from the past 48 hour(s))  Troponin I     Status: None   Collection Time: 09/13/16  8:51 AM  Result Value Ref Range   Troponin I <0.03 <0.03 ng/mL  Troponin I     Status: None   Collection Time: 09/13/16  3:06 PM  Result Value Ref Range   Troponin I <0.03 <0.03 ng/mL  Basic metabolic panel     Status: Abnormal   Collection Time: 09/14/16  8:14 AM  Result Value Ref Range   Sodium 136 135 - 145 mmol/L   Potassium 4.7 3.5 - 5.1 mmol/L    Comment: DELTA CHECK NOTED   Chloride 99 (L) 101 - 111 mmol/L   CO2 29 22 - 32 mmol/L   Glucose, Bld 126 (H) 65 - 99 mg/dL   BUN 16 6 - 20 mg/dL   Creatinine, Ser 0.54 0.44 - 1.00 mg/dL   Calcium 9.2 8.9 - 10.3 mg/dL   GFR calc non Af Amer >60 >60 mL/min   GFR calc Af Amer >60 >60 mL/min    Comment: (NOTE) The eGFR has been  calculated using the CKD EPI equation. This calculation has not been validated in all clinical situations. eGFR's persistently <60 mL/min signify possible Chronic Kidney Disease.    Anion gap 8 5 - 15  CBC     Status: Abnormal   Collection Time: 09/15/16  4:40 AM  Result Value Ref Range   WBC 20.2 (H) 4.0 - 10.5 K/uL   RBC 5.18 (H) 3.87 - 5.11 MIL/uL   Hemoglobin 14.9 12.0 - 15.0 g/dL   HCT 46.7 (H) 36.0 - 46.0 %   MCV 90.2 78.0 - 100.0 fL   MCH 28.8 26.0 - 34.0 pg   MCHC 31.9 30.0 - 36.0 g/dL   RDW 15.3 11.5 - 15.5 %   Platelets 164 150 - 400 K/uL  Basic metabolic panel     Status: Abnormal   Collection Time: 09/15/16  4:40 AM  Result Value Ref Range   Sodium 137 135 - 145 mmol/L   Potassium 4.6 3.5 - 5.1 mmol/L   Chloride 99 (L) 101 - 111 mmol/L   CO2 30 22 - 32 mmol/L   Glucose, Bld 127 (H) 65 - 99 mg/dL   BUN 19 6 - 20 mg/dL   Creatinine, Ser 0.53 0.44 - 1.00 mg/dL   Calcium 9.4 8.9 -  10.3 mg/dL   GFR calc non Af Amer >60 >60 mL/min   GFR calc Af Amer >60 >60 mL/min    Comment: (NOTE) The eGFR has been calculated using the CKD EPI equation. This calculation has not been validated in all clinical situations. eGFR's persistently <60 mL/min signify possible Chronic Kidney Disease.    Anion gap 8 5 - 15    ABGS No results for input(s): PHART, PO2ART, TCO2, HCO3 in the last 72 hours.  Invalid input(s): PCO2 CULTURES Recent Results (from the past 240 hour(s))  MRSA PCR Screening     Status: None   Collection Time: 09/13/16  6:15 AM  Result Value Ref Range Status   MRSA by PCR NEGATIVE NEGATIVE Final    Comment:        The GeneXpert MRSA Assay (FDA approved for NASAL specimens only), is one component of a comprehensive MRSA colonization surveillance program. It is not intended to diagnose MRSA infection nor to guide or monitor treatment for MRSA infections.    Studies/Results: No results found.  Medications:  Prior to Admission:  Prescriptions Prior to  Admission  Medication Sig Dispense Refill Last Dose  . acetaminophen (TYLENOL) 500 MG tablet Take 500 mg by mouth every 6 (six) hours as needed for mild pain. For pain    09/11/2016  . albuterol (PROVENTIL) (2.5 MG/3ML) 0.083% nebulizer solution Take 2.5 mg by nebulization 2 (two) times daily as needed.   09/12/2016 at Unknown time  . alendronate (FOSAMAX) 70 MG tablet Take 70 mg by mouth once a week. Take with a full glass of water on an empty stomach.   09/07/2016  . aspirin (ASPIRIN EC) 81 MG EC tablet Take 1 tablet (81 mg total) by mouth daily. Swallow whole.   09/12/2016 at Unknown time  . atorvastatin (LIPITOR) 40 MG tablet Take 40 mg by mouth. Monday, Wednesday, & Friday evening.   09/12/2016 at Unknown time  . Calcium Carbonate-Vit D-Min (CALTRATE 600+D PLUS) 600-400 MG-UNIT per tablet Take 1 tablet by mouth 2 (two) times daily.     09/12/2016 at Unknown time  . digoxin (LANOXIN) 0.25 MG tablet Take 0.025 mg by mouth every evening.   09/12/2016 at Unknown time  . diltiazem (CARDIZEM) 30 MG tablet Take 15 mg by mouth 2 (two) times daily.   09/12/2016 at Unknown time  . docusate sodium (COLACE) 100 MG capsule Take 100 mg by mouth at bedtime.   09/11/2016 at Unknown time  . furosemide (LASIX) 40 MG tablet Take 10 mg by mouth 2 (two) times daily.    09/12/2016 at Unknown time  . levETIRAcetam (KEPPRA) 250 MG tablet Takes one tablet in the morning and half a tablet at 3 pm.   09/12/2016 at Unknown time  . levothyroxine (SYNTHROID, LEVOTHROID) 25 MCG tablet Take 25 mcg by mouth daily before breakfast.   09/12/2016 at Unknown time  . midodrine (PROAMATINE) 2.5 MG tablet Take 1 tablet (2.5 mg total) by mouth 2 (two) times daily with a meal. The dosing can be titrated down to once daily when her systolic blood pressure is consistently in the 100s.   09/12/2016 at Unknown time  . Multiple Vitamins-Minerals (ICAPS) CAPS Take 1 capsule by mouth 2 (two) times daily.   09/12/2016 at Unknown time  . nitroGLYCERIN  (NITROSTAT) 0.4 MG SL tablet Place 0.4 mg under the tongue every 5 (five) minutes as needed for chest pain.   unknown  . omeprazole (PRILOSEC OTC) 20 MG tablet Take 20 mg by  mouth. Monday, Wednesday, & Friday evening.   09/11/2016  . potassium chloride (KLOR-CON) 10 MEQ CR tablet Take 10 mEq by mouth daily.     09/12/2016 at Unknown time  . predniSONE (DELTASONE) 10 MG tablet Take 4 tablets (40 mg total) by mouth daily. 20 tablet 0 09/11/2016  . Probiotic Product (PROBIOTIC DAILY PO) Take 1 tablet by mouth every morning.   09/11/2016   Scheduled: . aspirin EC  81 mg Oral Daily  . atorvastatin  40 mg Oral q1800  . calcium-vitamin D  1 tablet Oral BID  . digoxin  0.125 mg Oral QPM  . diltiazem  15 mg Oral Q6H  . docusate sodium  100 mg Oral QHS  . doxycycline  100 mg Oral Q12H  . enoxaparin (LOVENOX) injection  40 mg Subcutaneous Q24H  . furosemide  20 mg Oral Daily  . guaiFENesin  1,200 mg Oral BID  . ipratropium  0.5 mg Nebulization Q6H  . levalbuterol  0.63 mg Nebulization Q6H  . levETIRAcetam  125 mg Oral Q1500  . levETIRAcetam  250 mg Oral q morning - 10a  . levothyroxine  25 mcg Oral QAC breakfast  . loratadine  10 mg Oral Daily  . methylPREDNISolone (SOLU-MEDROL) injection  60 mg Intravenous Q6H  . midodrine  2.5 mg Oral BID WC  . multivitamin-lutein  1 capsule Oral BID   Continuous: . sodium chloride 20 mL/hr at 09/13/16 0648  . sodium chloride Stopped (09/14/16 2059)   BVQ:[id number], levalbuterol, nitroGLYCERIN  Assesment:She was admitted with COPD/asthma with acute exacerbation. She is much improved. She still has significant congestion. She has Alzheimer's disease at baseline and she is more confused today. She still has atrial fibrillation with RVR and her heart rate was about 1:30 at rest so I took the liberty of increasing her diltiazem slightly. I do not think are going to get control of her heart rate totally until her respiratory status is better Principal Problem:    COPD with acute exacerbation (HCC) Active Problems:   Mixed hyperlipidemia   DIASTOLIC HEART FAILURE, CHRONIC   Hypotension   Alzheimer disease   Seizures (HCC)   Hypokalemia   Atrial fibrillation with RVR (HCC)   COPD exacerbation (HCC)    Plan: As above continue treatments with IV steroids and by mouth antibiotics and inhaled bronchodilators    LOS: 2 days   Cinda Hara L 09/15/2016, 8:21 AM

## 2016-09-15 NOTE — Plan of Care (Signed)
Problem: Safety: Goal: Ability to remain free from injury will improve Outcome: Progressing Due to patient's H/O Dementia she has to be redirected and reoriented at times

## 2016-09-16 ENCOUNTER — Inpatient Hospital Stay (HOSPITAL_COMMUNITY): Payer: Medicare Other

## 2016-09-16 LAB — BLOOD GAS, ARTERIAL
ACID-BASE EXCESS: 10.9 mmol/L — AB (ref 0.0–2.0)
BICARBONATE: 33 mmol/L — AB (ref 20.0–28.0)
DRAWN BY: 221791
O2 CONTENT: 4 L/min
O2 SAT: 87.8 %
PCO2 ART: 53.2 mmHg — AB (ref 32.0–48.0)
Patient temperature: 37
pH, Arterial: 7.441 (ref 7.350–7.450)
pO2, Arterial: 52.9 mmHg — ABNORMAL LOW (ref 83.0–108.0)

## 2016-09-16 LAB — BASIC METABOLIC PANEL
Anion gap: 10 (ref 5–15)
BUN: 18 mg/dL (ref 6–20)
CALCIUM: 9 mg/dL (ref 8.9–10.3)
CO2: 29 mmol/L (ref 22–32)
CREATININE: 0.43 mg/dL — AB (ref 0.44–1.00)
Chloride: 95 mmol/L — ABNORMAL LOW (ref 101–111)
GFR calc Af Amer: >60 mL/min (ref >60)
GLUCOSE: 137 mg/dL — AB (ref 65–99)
POTASSIUM: 4.1 mmol/L (ref 3.5–5.1)
SODIUM: 134 mmol/L — AB (ref 135–145)

## 2016-09-16 LAB — CBC
HEMATOCRIT: 46.6 % — AB (ref 36.0–46.0)
Hemoglobin: 15.4 g/dL — ABNORMAL HIGH (ref 12.0–15.0)
MCH: 29.3 pg (ref 26.0–34.0)
MCHC: 33 g/dL (ref 30.0–36.0)
MCV: 88.8 fL (ref 78.0–100.0)
PLATELETS: 192 10*3/uL (ref 150–400)
RBC: 5.25 MIL/uL — ABNORMAL HIGH (ref 3.87–5.11)
RDW: 14.8 % (ref 11.5–15.5)
WBC: 25.5 10*3/uL — AB (ref 4.0–10.5)

## 2016-09-16 LAB — BRAIN NATRIURETIC PEPTIDE: B Natriuretic Peptide: 217 pg/mL — ABNORMAL HIGH (ref 0.0–100.0)

## 2016-09-16 MED ORDER — DILTIAZEM HCL 30 MG PO TABS
30.0000 mg | ORAL_TABLET | Freq: Four times a day (QID) | ORAL | Status: DC
  Administered 2016-09-16 (×2): 30 mg via ORAL
  Filled 2016-09-16 (×2): qty 1

## 2016-09-16 MED ORDER — DILTIAZEM LOAD VIA INFUSION: 10.0000 mg | Freq: Once | INTRAVENOUS | Status: DC

## 2016-09-16 MED ORDER — FLUTICASONE PROPIONATE 50 MCG/ACT NA SUSP
2.0000 | Freq: Every day | NASAL | Status: DC
  Administered 2016-09-16 – 2016-09-21 (×6): 2 via NASAL
  Filled 2016-09-16: qty 16

## 2016-09-16 MED ORDER — DILTIAZEM HCL 25 MG/5ML IV SOLN: 10.0000 mg | Freq: Once | INTRAVENOUS | Status: DC

## 2016-09-16 MED ORDER — DILTIAZEM HCL-DEXTROSE 100-5 MG/100ML-% IV SOLN (PREMIX)
5.0000 mg/h | INTRAVENOUS | Status: DC
  Administered 2016-09-16 – 2016-09-18 (×5): 5 mg/h via INTRAVENOUS
  Administered 2016-09-19 – 2016-09-20 (×2): 6 mg/h via INTRAVENOUS
  Filled 2016-09-16 (×5): qty 100

## 2016-09-16 MED ORDER — FUROSEMIDE 10 MG/ML IJ SOLN
20.0000 mg | Freq: Once | INTRAMUSCULAR | Status: AC
  Administered 2016-09-16: 20 mg via INTRAVENOUS
  Filled 2016-09-16: qty 2

## 2016-09-16 MED ORDER — SALINE SPRAY 0.65 % NA SOLN: 1.0000 | NASAL | Status: DC | PRN

## 2016-09-16 MED ORDER — ONDANSETRON HCL 4 MG/2ML IJ SOLN
4.0000 mg | Freq: Four times a day (QID) | INTRAMUSCULAR | Status: DC | PRN
  Administered 2016-09-16: 4 mg via INTRAVENOUS
  Filled 2016-09-16: qty 2

## 2016-09-16 MED ORDER — DILTIAZEM HCL 25 MG/5ML IV SOLN
10.0000 mg | Freq: Once | INTRAVENOUS | Status: AC
  Administered 2016-09-16: 10 mg via INTRAVENOUS
  Filled 2016-09-16: qty 5

## 2016-09-16 MED ORDER — GUAIFENESIN-DM 100-10 MG/5ML PO SYRP
5.0000 mL | ORAL_SOLUTION | ORAL | Status: DC | PRN
  Administered 2016-09-16 – 2016-09-18 (×2): 5 mL via ORAL
  Filled 2016-09-16 (×2): qty 5

## 2016-09-16 MED ORDER — HALOPERIDOL LACTATE 5 MG/ML IJ SOLN
2.5000 mg | Freq: Once | INTRAMUSCULAR | Status: AC
  Administered 2016-09-16: 2.5 mg via INTRAVENOUS
  Filled 2016-09-16: qty 1

## 2016-09-16 NOTE — Care Management Important Message (Signed)
Important Message  Patient Details  Name: Deborah [name] MRN: [id number] Date of Birth: 21-Apr-1926   Medicare Important Message Given:  Yes    Malcolm Metro, RN 09/16/2016, 1:30 PM

## 2016-09-16 NOTE — Progress Notes (Signed)
Patient has voided 700cc urine since 1 time dose of Lasix  IV push given @ 1612. Lung sounds have improved at this time.

## 2016-09-16 NOTE — Progress Notes (Signed)
Subjective: She is having more trouble with shortness of breath this morning. She's coughing mostly nonproductively. No other complaints. She is less confused than yesterday.  Objective: Vital signs in last 24 hours: Temp:  [97.7 F (36.5 C)-98 F (36.7 C)] 97.8 F (36.6 C) (04/27 0801) Pulse Rate:  [35-134] 123 (04/27 0500) Resp:  [18-27] 23 (04/27 0500) BP: (91-159)/(80-108) 110/96 (04/27 0500) SpO2:  [88 %-98 %] 95 % (04/27 0752) Weight:  [65.3 kg (143 lb 15.4 oz)] 65.3 kg (143 lb 15.4 oz) (04/27 0500) Weight change: 0.3 kg (10.6 oz) Last BM Date: 09/15/16  Intake/Output from previous day: 04/26 0701 - 04/27 0700 In: 360 [P.O.:360] Out: 827 [Urine:825; Stool:2]  PHYSICAL EXAM General appearance: alert, cooperative and moderate distress Resp: Although her chest is better then the first day that I listened to her she is having a little more struggle to breathe and using accessory muscles more than yesterday Cardio: She has atrial fib and her ventricular response now is about 115. GI: soft, non-tender; bowel sounds normal; no masses,  no organomegaly Extremities: extremities normal, atraumatic, no cyanosis or edema Skin warm and dry  Lab Results:  Results for orders placed or performed during the hospital encounter of 09/13/16 (from the past 48 hour(s))  CBC     Status: Abnormal   Collection Time: 09/15/16  4:40 AM  Result Value Ref Range   WBC 20.2 (H) 4.0 - 10.5 K/uL   RBC 5.18 (H) 3.87 - 5.11 MIL/uL   Hemoglobin 14.9 12.0 - 15.0 g/dL   HCT 46.7 (H) 36.0 - 46.0 %   MCV 90.2 78.0 - 100.0 fL   MCH 28.8 26.0 - 34.0 pg   MCHC 31.9 30.0 - 36.0 g/dL   RDW 15.3 11.5 - 15.5 %   Platelets 164 150 - 400 K/uL  Basic metabolic panel     Status: Abnormal   Collection Time: 09/15/16  4:40 AM  Result Value Ref Range   Sodium 137 135 - 145 mmol/L   Potassium 4.6 3.5 - 5.1 mmol/L   Chloride 99 (L) 101 - 111 mmol/L   CO2 30 22 - 32 mmol/L   Glucose, Bld 127 (H) 65 - 99 mg/dL    BUN 19 6 - 20 mg/dL   Creatinine, Ser 0.53 0.44 - 1.00 mg/dL   Calcium 9.4 8.9 - 10.3 mg/dL   GFR calc non Af Amer >60 >60 mL/min   GFR calc Af Amer >60 >60 mL/min    Comment: (NOTE) The eGFR has been calculated using the CKD EPI equation. This calculation has not been validated in all clinical situations. eGFR's persistently <60 mL/min signify possible Chronic Kidney Disease.    Anion gap 8 5 - 15  Basic metabolic panel     Status: Abnormal   Collection Time: 09/16/16  4:22 AM  Result Value Ref Range   Sodium 134 (L) 135 - 145 mmol/L   Potassium 4.1 3.5 - 5.1 mmol/L   Chloride 95 (L) 101 - 111 mmol/L   CO2 29 22 - 32 mmol/L   Glucose, Bld 137 (H) 65 - 99 mg/dL   BUN 18 6 - 20 mg/dL   Creatinine, Ser 0.43 (L) 0.44 - 1.00 mg/dL   Calcium 9.0 8.9 - 10.3 mg/dL   GFR calc non Af Amer >60 >60 mL/min   GFR calc Af Amer >60 >60 mL/min    Comment: (NOTE) The eGFR has been calculated using the CKD EPI equation. This calculation has not been validated in   all clinical situations. eGFR's persistently <60 mL/min signify possible Chronic Kidney Disease.    Anion gap 10 5 - 15  CBC     Status: Abnormal   Collection Time: 09/16/16  7:47 AM  Result Value Ref Range   WBC 25.5 (H) 4.0 - 10.5 K/uL    Comment: ADJUSTED FOR NUCLEATED RBC'S   RBC 5.25 (H) 3.87 - 5.11 MIL/uL   Hemoglobin 15.4 (H) 12.0 - 15.0 g/dL   HCT 46.6 (H) 36.0 - 46.0 %   MCV 88.8 78.0 - 100.0 fL   MCH 29.3 26.0 - 34.0 pg   MCHC 33.0 30.0 - 36.0 g/dL   RDW 14.8 11.5 - 15.5 %   Platelets 192 150 - 400 K/uL    ABGS No results for input(s): PHART, PO2ART, TCO2, HCO3 in the last 72 hours.  Invalid input(s): PCO2 CULTURES Recent Results (from the past 240 hour(s))  MRSA PCR Screening     Status: None   Collection Time: 09/13/16  6:15 AM  Result Value Ref Range Status   MRSA by PCR NEGATIVE NEGATIVE Final    Comment:        The GeneXpert MRSA Assay (FDA approved for NASAL specimens only), is one component of  a comprehensive MRSA colonization surveillance program. It is not intended to diagnose MRSA infection nor to guide or monitor treatment for MRSA infections.    Studies/Results: No results found.  Medications:  Prior to Admission:  Prescriptions Prior to Admission  Medication Sig Dispense Refill Last Dose  . acetaminophen (TYLENOL) 500 MG tablet Take 500 mg by mouth every 6 (six) hours as needed for mild pain. For pain    09/11/2016  . albuterol (PROVENTIL) (2.5 MG/3ML) 0.083% nebulizer solution Take 2.5 mg by nebulization 2 (two) times daily as needed.   09/12/2016 at Unknown time  . alendronate (FOSAMAX) 70 MG tablet Take 70 mg by mouth once a week. Take with a full glass of water on an empty stomach.   09/07/2016  . aspirin (ASPIRIN EC) 81 MG EC tablet Take 1 tablet (81 mg total) by mouth daily. Swallow whole.   09/12/2016 at Unknown time  . atorvastatin (LIPITOR) 40 MG tablet Take 40 mg by mouth. Monday, Wednesday, & Friday evening.   09/12/2016 at Unknown time  . Calcium Carbonate-Vit D-Min (CALTRATE 600+D PLUS) 600-400 MG-UNIT per tablet Take 1 tablet by mouth 2 (two) times daily.     09/12/2016 at Unknown time  . digoxin (LANOXIN) 0.25 MG tablet Take 0.025 mg by mouth every evening.   09/12/2016 at Unknown time  . diltiazem (CARDIZEM) 30 MG tablet Take 15 mg by mouth 2 (two) times daily.   09/12/2016 at Unknown time  . docusate sodium (COLACE) 100 MG capsule Take 100 mg by mouth at bedtime.   09/11/2016 at Unknown time  . furosemide (LASIX) 40 MG tablet Take 10 mg by mouth 2 (two) times daily.    09/12/2016 at Unknown time  . levETIRAcetam (KEPPRA) 250 MG tablet Takes one tablet in the morning and half a tablet at 3 pm.   09/12/2016 at Unknown time  . levothyroxine (SYNTHROID, LEVOTHROID) 25 MCG tablet Take 25 mcg by mouth daily before breakfast.   09/12/2016 at Unknown time  . midodrine (PROAMATINE) 2.5 MG tablet Take 1 tablet (2.5 mg total) by mouth 2 (two) times daily with a meal. The dosing  can be titrated down to once daily when her systolic blood pressure is consistently in the 100s.   09/12/2016  at Unknown time  . Multiple Vitamins-Minerals (ICAPS) CAPS Take 1 capsule by mouth 2 (two) times daily.   09/12/2016 at Unknown time  . nitroGLYCERIN (NITROSTAT) 0.4 MG SL tablet Place 0.4 mg under the tongue every 5 (five) minutes as needed for chest pain.   unknown  . omeprazole (PRILOSEC OTC) 20 MG tablet Take 20 mg by mouth. Monday, Wednesday, & Friday evening.   09/11/2016  . potassium chloride (KLOR-CON) 10 MEQ CR tablet Take 10 mEq by mouth daily.     09/12/2016 at Unknown time  . predniSONE (DELTASONE) 10 MG tablet Take 4 tablets (40 mg total) by mouth daily. 20 tablet 0 09/11/2016  . Probiotic Product (PROBIOTIC DAILY PO) Take 1 tablet by mouth every morning.   09/11/2016   Scheduled: . aspirin EC  81 mg Oral Daily  . atorvastatin  40 mg Oral q1800  . calcium-vitamin D  1 tablet Oral BID  . digoxin  0.125 mg Oral QPM  . diltiazem  30 mg Oral Q6H  . docusate sodium  100 mg Oral QHS  . doxycycline  100 mg Oral Q12H  . enoxaparin (LOVENOX) injection  40 mg Subcutaneous Q24H  . fluticasone  2 spray Each Nare Daily  . furosemide  20 mg Oral Daily  . guaiFENesin  1,200 mg Oral BID  . ipratropium  0.5 mg Nebulization Q6H  . levalbuterol  0.63 mg Nebulization Q6H  . levETIRAcetam  125 mg Oral Q1500  . levETIRAcetam  250 mg Oral q morning - 10a  . levothyroxine  25 mcg Oral QAC breakfast  . loratadine  10 mg Oral Daily  . methylPREDNISolone (SOLU-MEDROL) injection  60 mg Intravenous Q6H  . midodrine  2.5 mg Oral BID WC  . multivitamin-lutein  1 capsule Oral BID  . senna  1 tablet Oral Daily   Continuous: . sodium chloride 20 mL/hr at 09/13/16 0648  . sodium chloride Stopped (09/14/16 2059)   TSV:[id number], guaiFENesin-dextromethorphan, levalbuterol, nitroGLYCERIN, sodium chloride  Assesment: She was admitted with COPD with exacerbation. She has improved from admission but  she's a little worse than yesterday. Her white blood count is up to 25,000 but she is on IV steroids. I have requested chest x-ray to be sure that she doesn't show frank pneumonia now. Principal Problem:   COPD with acute exacerbation ([facility]) Active Problems:   Mixed hyperlipidemia   DIASTOLIC HEART FAILURE, CHRONIC   Hypotension   Alzheimer disease   Seizures (HCC)   Hypokalemia   Atrial fibrillation with RVR (HCC)   COPD exacerbation (HCC)    Plan: Continue treatments. Chest x-ray as above    LOS: 3 days   Derold Dorsch L 09/16/2016, 8:49 AM

## 2016-09-16 NOTE — Progress Notes (Signed)
1800 Patient attempted to exit the bed x 3 times now w/o assistance. Patient noted more anxious with labored breathing and crackles noted to bilateral lungs with O2 SAT 88% on [place] 2l. Patient has been on O2 [place] 2L and has been increased to 4L [place] d/t low O2 SAT and labored breathing. RT called to assess pt, MD notified.

## 2016-09-16 NOTE — Progress Notes (Signed)
Progress Note  Patient Name: Deborah [name] Date of Encounter: 09/16/2016  Primary Cardiologist: Dr. Jonelle Sidle  Subjective   Had a difficult night due to confusion, also oxygen desaturations due to pulmonary status. She is calm this morning, just had a breathing treatment.  Inpatient Medications    Scheduled Meds: . aspirin EC  81 mg Oral Daily  . atorvastatin  40 mg Oral q1800  . calcium-vitamin D  1 tablet Oral BID  . digoxin  0.125 mg Oral QPM  . diltiazem  15 mg Oral Q6H  . docusate sodium  100 mg Oral QHS  . doxycycline  100 mg Oral Q12H  . enoxaparin (LOVENOX) injection  40 mg Subcutaneous Q24H  . fluticasone  2 spray Each Nare Daily  . furosemide  20 mg Oral Daily  . guaiFENesin  1,200 mg Oral BID  . ipratropium  0.5 mg Nebulization Q6H  . levalbuterol  0.63 mg Nebulization Q6H  . levETIRAcetam  125 mg Oral Q1500  . levETIRAcetam  250 mg Oral q morning - 10a  . levothyroxine  25 mcg Oral QAC breakfast  . loratadine  10 mg Oral Daily  . methylPREDNISolone (SOLU-MEDROL) injection  60 mg Intravenous Q6H  . midodrine  2.5 mg Oral BID WC  . multivitamin-lutein  1 capsule Oral BID  . senna  1 tablet Oral Daily   Continuous Infusions: . sodium chloride 20 mL/hr at 09/13/16 0648  . sodium chloride Stopped (09/14/16 2059)   PRN Meds: acetaminophen, guaiFENesin-dextromethorphan, levalbuterol, nitroGLYCERIN, sodium chloride   Vital Signs    Vitals:   09/16/16 0400 09/16/16 0500 09/16/16 0752 09/16/16 0801  BP:  (!) 110/96    Pulse:  (!) 123    Resp:  (!) 23    Temp: 97.9 F (36.6 C)   97.8 F (36.6 C)  TempSrc: Oral   Oral  SpO2:  96% 95%   Weight:  143 lb 15.4 oz (65.3 kg)    Height:        Intake/Output Summary (Last 24 hours) at 09/16/16 0821 Last data filed at 09/16/16 0500  Gross per 24 hour  Intake              360 ml  Output              827 ml  Net             -467 ml   Filed Weights   09/14/16 0500 09/15/16 0500 09/16/16 0500    Weight: 143 lb 4.8 oz (65 kg) 143 lb 4.8 oz (65 kg) 143 lb 15.4 oz (65.3 kg)    Telemetry    Atrial fibrillation with RVR. Personally reviewed.  Physical Exam   GEN: Elderly woman, no acute distress.   Neck: No JVD. Cardiac:  Irregularly irregular, no gallop.  Respiratory:  Scattered rhonchi with expiratory wheezes anteriorly. GI: Soft, nontender, bowel sounds present. MS: No edema; No deformity.  Labs    Chemistry  Recent Labs Lab 09/10/16 2030  09/14/16 0814 09/15/16 0440 09/16/16 0422  NA 144  < > 136 137 134*  K 4.2  < > 4.7 4.6 4.1  CL 106  < > 99* 99* 95*  CO2 29  < > GLUCOSE 95  < > 126* 127* 137*  BUN 16  < > CREATININE 0.75  < > 0.54 0.53 0.43*  CALCIUM 9.5  < > 9.2 9.4 9.0  PROT 7.0  --   --   --   --  ALBUMIN 4.3  --   --   --   --   AST 28  --   --   --   --   ALT 19  --   --   --   --   ALKPHOS 71  --   --   --   --   BILITOT 0.5  --   --   --   --   GFRNONAA >60  < > >60 >60 >60  GFRAA >60  < > >60 >60 >60  ANIONGAP 9  < > < > = values in this interval not displayed.   Hematology  Recent Labs Lab 09/13/16 0302 09/15/16 0440 09/16/16 0747  WBC 9.5 20.2* 25.5*  RBC 5.25* 5.18* 5.25*  HGB 15.3* 14.9 15.4*  HCT 46.9* 46.7* 46.6*  MCV 89.3 90.2 88.8  MCH 29.1 28.8 29.3  MCHC 32.6 31.9 33.0  RDW 15.2 15.3 14.8  PLT 179 164 192    Cardiac Enzymes  Recent Labs Lab 09/13/16 0302 09/13/16 0851 09/13/16 1506  TROPONINI 0.03* <0.03 <0.03   No results for input(s): TROPIPOC in the last 168 hours.   BNP  Recent Labs Lab 09/10/16 2030 09/13/16 0302  BNP 102.0* 134.0*     Radiology    Chest x-ray 09/13/2016: FINDINGS: There is emphysematous changes of the lungs with interstitial coarsening. No focal consolidation, pleural effusion, or pneumothorax. Top-normal cardiac silhouette. No acute osseous pathology.  IMPRESSION: 1. No acute cardiopulmonary process. 2. Emphysema.  Patient Profile     83  y.o. female with a history of chronic atrial fibrillation (not anticoagulated due to fall risk and prior history of hemorrhage), chronic hypotension requiring midodrine, previous intolerance to amiodarone and flecainide, also symptomatically stable CAD. She is currently admitted to the hospital with respiratory failure in the setting of COPD exacerbation. Atrial fibrillation heart rate has been elevated in this setting.  Assessment & Plan    1. Chronic atrial fibrillation with recent RVR in the setting of COPD exacerbation. History of chronic hypotension on midodrine has limited high-dose rate control medications as an outpatient, and generally she did well on Cardizem at 15 mg twice daily. This dose has been uptitrated during her current hospital stay and so far she is tolerating it well. Blood pressure has not been a limiting factor, and could be somewhat bolstered by the current high-dose steroids.  2. COPD exacerbation. She is being managed by Dr. Juanetta Gosling, currently on steroids, antibiotics, and bronchodilators. Still not at baseline.  3. Chronic hypotension, on midodrine.  Plan to increase Cardizem to 30 mg by mouth every 6 hours as long as her blood pressure tolerates in an attempt to try and provide additional heart rate control of her chronic atrial fibrillation. Ultimately however, improvement in pulmonary status is going to be the most helpful in this regard.  Signed, Nona Dell, MD  09/16/2016, 8:21 AM

## 2016-09-16 NOTE — Progress Notes (Addendum)
PROGRESS NOTE    Deborah [name]  WJX:[id number] DOB: [birth year] DOA: 09/13/2016 PCP: Ignatius Specking, MD    Brief Narrative:  Deborah [name] is a 83 y.o. female with medical history significant of Alzheimer's disease, asthma, atrial fibrillation, chronic diastolic heart failure, COPD, CAD, hypertension, mixed hyperlipidemia, mild history of seizures who is returning to the ER due to shortness of breath.  Per patient and her daughter, on Thursday, the patient got exposed to environmental allergens during the day subsequently developing wheezing and dyspnea. She used her MDI and nebulizer without significant relief. She had to come to the emergency department on Saturday for treatment. She was discharged home with oral prednisone. Symptoms improved but subsequently got worse and presented again to ED.   Assessment & Plan:   Principal Problem:   COPD with acute exacerbation (HCC) Active Problems:   Mixed hyperlipidemia   DIASTOLIC HEART FAILURE, CHRONIC   Hypotension   Alzheimer disease   Seizures (HCC)   Hypokalemia   Atrial fibrillation with RVR (HCC)   COPD exacerbation (HCC)  1-Acute COPD exacerbation; Acute hypoxic respiratory failure.  Secondary to environmental factors.  Continue with Xopenex, Ipratropium.  On IV solumedrol.  Low dose oral lasix.  Doxycycline started 4-25  for copd flare.  Appreciate Dr Juanetta Gosling help.  Worsening SOB. WBC up. Follow chest x ray/ if evidence of PNA will add IV antibiotics.  Addendum;  Patient more hypoxic, wets sound. HR fluctuates. Will give time dose lasix. Nurse will inform cardiology about  A fib.  Daughter would like to avoid sedatives.  6;28;  Came back to see patient, bilateral ronchus, tachypnea. HR in 120--130.  Will get ABG. Might need BIPAP/ Patient to get nebulizer.  IV Cardizem one time dose.    2-A fib RVR Continue with digoxin and Cardizem. Cardizem change  to QID  4-26. Plan to increase dose today.  Appreciate Dr Diona Browner  help.   3-Chronic Diastolic HF  Continue with  lasix.   4-hyperlipidemia; Continue with Lipitor.   5-Chronic hypotension;  Continue with midodrine.   6-Alzheimer;  Was started on ativan yesterday for anxiety, patient became  confuse after. Marland Kitchen  Avoid ativan.  Got agitated last night, received haldol.   7-Seizure;  Continue with keppra.   8-Hypothyroidism;  Continue with synthroid.   Acute encephalopathy; related to medication, ativan. And hospital delirium Discontinue ativan.  Got agitated last night , received haldol.   Leukocytosis;  Suspect related to steroids.  Plan to chest  chest x ray.   DVT prophylaxis: lovenox Code Status: DNR Family Communication: daughter who was at bedside.  Disposition Plan:  Remain in the step down unit    Consultants:   Cardiology  Pulmonology    Procedures: none   Antimicrobials:   Doxycycline 4-25   Subjective: Patient was agitated last night, received haldol. She is mildly confuse today, report worsening dyspnea.    Objective: Vitals:   09/16/16 0752 09/16/16 0800 09/16/16 0801 09/16/16 0900  BP:  (!) 134/113  (!) 114/91  Pulse:  82  (!) 106  Resp:  20  (!) 27  Temp:   97.8 F (36.6 C)   TempSrc:   Oral   SpO2: 95% 97%  95%  Weight:      Height:        Intake/Output Summary (Last 24 hours) at 09/16/16 1350 Last data filed at 09/16/16 0500  Gross per 24 hour  Intake  0 ml  Output              376 ml  Net             -376 ml   Filed Weights   09/14/16 0500 09/15/16 0500 09/16/16 0500  Weight: 65 kg (143 lb 4.8 oz) 65 kg (143 lb 4.8 oz) 65.3 kg (143 lb 15.4 oz)    Examination:  General exam ; sleepy, mildly confused Respiratory system: Bilateral wheezing, crackles, tachypnea.  Cardiovascular system: I RR S1 & S2 heard. positive JVD, murmurs, rubs, gallops or clicks. No pedal edema. Gastrointestinal system: Abdomen is nondistended, soft and nontender. No organomegaly or masses felt. Normal  bowel sounds heard. Central nervous system: Alert and confuse. Moves all 4 extremities.  Extremities: Symmetric 5 x 5 power. Skin: No rashes, lesions or ulcers     Data Reviewed: I have personally reviewed following labs and imaging studies  CBC:  Recent Labs Lab 09/10/16 2030 09/13/16 0302 09/15/16 0440 09/16/16 0747  WBC 6.3 9.5 20.2* 25.5*  NEUTROABS 4.8 7.5  --   --   HGB 14.2 15.3* 14.9 15.4*  HCT 44.4 46.9* 46.7* 46.6*  MCV 90.4 89.3 90.2 88.8  PLT 165 179 164 192   Basic Metabolic Panel:  Recent Labs Lab 09/10/16 2030 09/13/16 0302 09/14/16 0814 09/15/16 0440 09/16/16 0422  NA 144 136 136 137 134*  K 4.2 3.4* 4.7 4.6 4.1  CL 106 97* 99* 99* 95*  CO2 GLUCOSE 95 105* 126* 127* 137*  BUN CREATININE 0.75 0.57 0.54 0.53 0.43*  CALCIUM 9.5 9.3 9.2 9.4 9.0  MG  --  2.1  --   --   --   PHOS  --  2.7  --   --   --    GFR: Estimated Creatinine Clearance: 43.7 mL/min (A) (by C-G formula based on SCr of 0.43 mg/dL (L)). Liver Function Tests:  Recent Labs Lab 09/10/16 2030  AST 28  ALT 19  ALKPHOS 71  BILITOT 0.5  PROT 7.0  ALBUMIN 4.3   No results for input(s): LIPASE, AMYLASE in the last 168 hours. No results for input(s): AMMONIA in the last 168 hours. Coagulation Profile: No results for input(s): INR, PROTIME in the last 168 hours. Cardiac Enzymes:  Recent Labs Lab 09/13/16 0302 09/13/16 0851 09/13/16 1506  TROPONINI 0.03* <0.03 <0.03   BNP (last 3 results) No results for input(s): PROBNP in the last 8760 hours. HbA1C: No results for input(s): HGBA1C in the last 72 hours. CBG: No results for input(s): GLUCAP in the last 168 hours. Lipid Profile: No results for input(s): CHOL, HDL, LDLCALC, TRIG, CHOLHDL, LDLDIRECT in the last 72 hours. Thyroid Function Tests: No results for input(s): TSH, T4TOTAL, FREET4, T3FREE, THYROIDAB in the last 72 hours. Anemia Panel: No results for input(s): VITAMINB12, FOLATE,  FERRITIN, TIBC, IRON, RETICCTPCT in the last 72 hours. Sepsis Labs: No results for input(s): PROCALCITON, LATICACIDVEN in the last 168 hours.  Recent Results (from the past 240 hour(s))  MRSA PCR Screening     Status: None   Collection Time: 09/13/16  6:15 AM  Result Value Ref Range Status   MRSA by PCR NEGATIVE NEGATIVE Final    Comment:        The GeneXpert MRSA Assay (FDA approved for NASAL specimens only), is one component of a comprehensive MRSA colonization surveillance program. It is not intended to diagnose MRSA infection nor  to guide or monitor treatment for MRSA infections.          Radiology Studies: Dg Chest Port 1 View  Result Date: 09/16/2016 CLINICAL DATA:  Respiratory failure. EXAM: PORTABLE CHEST 1 VIEW COMPARISON:  09/13/2016 FINDINGS: Cardiomegaly. No confluent opacities, effusions or edema. No acute bony abnormality. IMPRESSION: Cardiomegaly.  No active disease. Electronically Signed   By: Charlett Nose M.D.   On: 09/16/2016 13:30        Scheduled Meds: . aspirin EC  81 mg Oral Daily  . atorvastatin  40 mg Oral q1800  . calcium-vitamin D  1 tablet Oral BID  . digoxin  0.125 mg Oral QPM  . diltiazem  30 mg Oral Q6H  . docusate sodium  100 mg Oral QHS  . doxycycline  100 mg Oral Q12H  . enoxaparin (LOVENOX) injection  40 mg Subcutaneous Q24H  . fluticasone  2 spray Each Nare Daily  . furosemide  20 mg Oral Daily  . guaiFENesin  1,200 mg Oral BID  . ipratropium  0.5 mg Nebulization Q6H  . levalbuterol  0.63 mg Nebulization Q6H  . levETIRAcetam  125 mg Oral Q1500  . levETIRAcetam  250 mg Oral q morning - 10a  . levothyroxine  25 mcg Oral QAC breakfast  . loratadine  10 mg Oral Daily  . methylPREDNISolone (SOLU-MEDROL) injection  60 mg Intravenous Q6H  . midodrine  2.5 mg Oral BID WC  . multivitamin-lutein  1 capsule Oral BID  . senna  1 tablet Oral Daily   Continuous Infusions: . sodium chloride 20 mL/hr at 09/13/16 0648  . sodium chloride  Stopped (09/14/16 2059)     LOS: 3 days    Time spent: 35 minutes.     Alba Cory, MD [facility] Pager [phone]  If 7PM-7AM, please contact night-coverage [email or web address] Password [email or web address] 09/16/2016, 1:50 PM

## 2016-09-16 NOTE — Progress Notes (Signed)
74 Spoke with MD regarding patient's heart rate, increased agitation and BP. MD at patient's bedside now.

## 2016-09-16 NOTE — Progress Notes (Signed)
Started patient on Cardizem gtt /hr. Gave pt a dose of Zofran due to becoming nauseous at first attempt of starting bipap. Pt's BP is stable at time HR remains 130's-140's at moment. Foley inserted due to IV lasix being given and patient being more confused.   Daughter at bedside Will continue to monitor  Genelle Bal, RN

## 2016-09-16 NOTE — Progress Notes (Signed)
Patient remained very confused but pleasant for the rest of night. Continuously removed Nasal cannula, and O2 sats drop to high 70's low 80's. Patient would replace [place] when asked to do so. Patient is now calm but asking when she can go back to [place]. Daughter is at bedside.  Genelle Bal, RN

## 2016-09-16 NOTE — Progress Notes (Signed)
Called by nurse - HR still elevated to 130s-140s. Will start cardizem drip. Needs bipap - O2 sats borderline with 10L with elevated pCO2.  Levie Heritage, DO 09/16/2016 8:30 PM

## 2016-09-17 ENCOUNTER — Inpatient Hospital Stay (HOSPITAL_COMMUNITY): Payer: Medicare Other

## 2016-09-17 LAB — BASIC METABOLIC PANEL
ANION GAP: 9 (ref 5–15)
BUN: 22 mg/dL — AB (ref 6–20)
CHLORIDE: 87 mmol/L — AB (ref 101–111)
CO2: 36 mmol/L — ABNORMAL HIGH (ref 22–32)
Calcium: 8.8 mg/dL — ABNORMAL LOW (ref 8.9–10.3)
Creatinine, Ser: 0.59 mg/dL (ref 0.44–1.00)
GFR calc non Af Amer: >60 mL/min (ref >60)
GLUCOSE: 132 mg/dL — AB (ref 65–99)
POTASSIUM: 3.6 mmol/L (ref 3.5–5.1)
SODIUM: 132 mmol/L — AB (ref 135–145)

## 2016-09-17 LAB — CBC
HCT: 46 % (ref 36.0–46.0)
HEMOGLOBIN: 15.3 g/dL — AB (ref 12.0–15.0)
MCH: 29.3 pg (ref 26.0–34.0)
MCHC: 33.3 g/dL (ref 30.0–36.0)
MCV: 88 fL (ref 78.0–100.0)
Platelets: 196 10*3/uL (ref 150–400)
RBC: 5.23 MIL/uL — ABNORMAL HIGH (ref 3.87–5.11)
RDW: 14.5 % (ref 11.5–15.5)
WBC: 24.3 10*3/uL — ABNORMAL HIGH (ref 4.0–10.5)

## 2016-09-17 MED ORDER — LEVALBUTEROL HCL 0.63 MG/3ML IN NEBU
0.6300 mg | INHALATION_SOLUTION | Freq: Three times a day (TID) | RESPIRATORY_TRACT | Status: DC
  Administered 2016-09-18 – 2016-09-21 (×10): 0.63 mg via RESPIRATORY_TRACT
  Filled 2016-09-17 (×10): qty 3

## 2016-09-17 MED ORDER — MORPHINE SULFATE (PF) 2 MG/ML IV SOLN
0.5000 mg | INTRAVENOUS | Status: DC | PRN
  Administered 2016-09-19 – 2016-09-20 (×4): 0.5 mg via INTRAVENOUS
  Filled 2016-09-17 (×4): qty 1

## 2016-09-17 MED ORDER — FUROSEMIDE 20 MG PO TABS
20.0000 mg | ORAL_TABLET | Freq: Every day | ORAL | Status: DC
  Administered 2016-09-17 – 2016-09-18 (×2): 20 mg via ORAL
  Filled 2016-09-17 (×2): qty 1

## 2016-09-17 MED ORDER — HALOPERIDOL LACTATE 5 MG/ML IJ SOLN
1.0000 mg | Freq: Once | INTRAMUSCULAR | Status: AC
  Administered 2016-09-17: 1 mg via INTRAVENOUS
  Filled 2016-09-17: qty 1

## 2016-09-17 MED ORDER — PIPERACILLIN-TAZOBACTAM 3.375 G IVPB
3.3750 g | Freq: Three times a day (TID) | INTRAVENOUS | Status: DC
  Administered 2016-09-17 – 2016-09-21 (×13): 3.375 g via INTRAVENOUS
  Filled 2016-09-17 (×11): qty 50

## 2016-09-17 MED ORDER — IPRATROPIUM BROMIDE 0.02 % IN SOLN
0.5000 mg | Freq: Three times a day (TID) | RESPIRATORY_TRACT | Status: DC
  Administered 2016-09-18 – 2016-09-21 (×10): 0.5 mg via RESPIRATORY_TRACT
  Filled 2016-09-17 (×10): qty 2.5

## 2016-09-17 NOTE — Progress Notes (Signed)
Subjective: She has had significant deterioration. She is now on BiPAP. She's been treated for more acute heart failure which I think is appropriate. She had good response to that but it doesn't seem to have made any difference in her breathing.  Objective: Vital signs in last 24 hours: Temp:  [98 F (36.7 C)-99.1 F (37.3 C)] 98 F (36.7 C) (04/28 0722) Pulse Rate:  [52-156] 145 (04/28 0722) Resp:  [12-31] 20 (04/28 0722) BP: (64-142)/(52-124) 107/94 (04/28 0600) SpO2:  [90 %-98 %] 96 % (04/28 0759) FiO2 (%):  [50 %] 50 % (04/28 0759) Weight:  [63.6 kg (140 lb 3.4 oz)] 63.6 kg (140 lb 3.4 oz) (04/28 0400) Weight change: -1.7 kg (-3 lb 12 oz) Last BM Date: 09/16/16  Intake/Output from previous day: 04/27 0701 - 04/28 0700 In: 271.5 [P.O.:240; I.V.:31.5] Out: 1700 [Urine:1700]  PHYSICAL EXAM General appearance: Much more sleepy. On BiPAP. Resp: rhonchi bilaterally Cardio: She is in atrial fib with mildly rapid heart rate at about 110 GI: soft, non-tender; bowel sounds normal; no masses,  no organomegaly Extremities: extremities normal, atraumatic, no cyanosis or edema Skin warm and dry  Lab Results:  Results for orders placed or performed during the hospital encounter of 09/13/16 (from the past 48 hour(s))  Basic metabolic panel     Status: Abnormal   Collection Time: 09/16/16  4:22 AM  Result Value Ref Range   Sodium 134 (L) 135 - 145 mmol/L   Potassium 4.1 3.5 - 5.1 mmol/L   Chloride 95 (L) 101 - 111 mmol/L   CO2 29 22 - 32 mmol/L   Glucose, Bld 137 (H) 65 - 99 mg/dL   BUN 18 6 - 20 mg/dL   Creatinine, Ser 0.43 (L) 0.44 - 1.00 mg/dL   Calcium 9.0 8.9 - 10.3 mg/dL   GFR calc non Af Amer >60 >60 mL/min   GFR calc Af Amer >60 >60 mL/min    Comment: (NOTE) The eGFR has been calculated using the CKD EPI equation. This calculation has not been validated in all clinical situations. eGFR's persistently <60 mL/min signify possible Chronic Kidney Disease.    Anion gap 10 5  - 15  CBC     Status: Abnormal   Collection Time: 09/16/16  7:47 AM  Result Value Ref Range   WBC 25.5 (H) 4.0 - 10.5 K/uL    Comment: ADJUSTED FOR NUCLEATED RBC'S   RBC 5.25 (H) 3.87 - 5.11 MIL/uL   Hemoglobin 15.4 (H) 12.0 - 15.0 g/dL   HCT 46.6 (H) 36.0 - 46.0 %   MCV 88.8 78.0 - 100.0 fL   MCH 29.3 26.0 - 34.0 pg   MCHC 33.0 30.0 - 36.0 g/dL   RDW 14.8 11.5 - 15.5 %   Platelets 192 150 - 400 K/uL  Blood gas, arterial     Status: Abnormal   Collection Time: 09/16/16  6:25 PM  Result Value Ref Range   O2 Content 4.0 L/min   Delivery systems NASAL CANNULA    pH, Arterial 7.441 7.350 - 7.450   pCO2 arterial 53.2 (H) 32.0 - 48.0 mmHg    Comment: RBV E.SPANGLER,RN AT 1835 BY V.LAWSON,RRT ON 09/16/16   pO2, Arterial 52.9 (L) 83.0 - 108.0 mmHg    Comment: RBV E.SPANGLER,RN AT 1835 BY VLAWSON,RRT ON 09/16/16   Bicarbonate 33.0 (H) 20.0 - 28.0 mmol/L   Acid-Base Excess 10.9 (H) 0.0 - 2.0 mmol/L   O2 Saturation 87.8 %   Patient temperature 37.0    Collection  site LEFT RADIAL    Drawn by [id number]    Sample type ARTERIAL    Allens test (pass/fail) PASS PASS  Brain natriuretic peptide     Status: Abnormal   Collection Time: 09/16/16  7:24 PM  Result Value Ref Range   B Natriuretic Peptide 217.0 (H) 0.0 - 100.0 pg/mL  CBC     Status: Abnormal   Collection Time: 09/17/16  5:49 AM  Result Value Ref Range   WBC 24.3 (H) 4.0 - 10.5 K/uL   RBC 5.23 (H) 3.87 - 5.11 MIL/uL   Hemoglobin 15.3 (H) 12.0 - 15.0 g/dL   HCT 46.0 36.0 - 46.0 %   MCV 88.0 78.0 - 100.0 fL   MCH 29.3 26.0 - 34.0 pg   MCHC 33.3 30.0 - 36.0 g/dL   RDW 14.5 11.5 - 15.5 %   Platelets 196 150 - 400 K/uL  Basic metabolic panel     Status: Abnormal   Collection Time: 09/17/16  5:49 AM  Result Value Ref Range   Sodium 132 (L) 135 - 145 mmol/L   Potassium 3.6 3.5 - 5.1 mmol/L   Chloride 87 (L) 101 - 111 mmol/L   CO2 36 (H) 22 - 32 mmol/L   Glucose, Bld 132 (H) 65 - 99 mg/dL   BUN 22 (H) 6 - 20 mg/dL   Creatinine, Ser  0.59 0.44 - 1.00 mg/dL   Calcium 8.8 (L) 8.9 - 10.3 mg/dL   GFR calc non Af Amer >60 >60 mL/min   GFR calc Af Amer >60 >60 mL/min    Comment: (NOTE) The eGFR has been calculated using the CKD EPI equation. This calculation has not been validated in all clinical situations. eGFR's persistently <60 mL/min signify possible Chronic Kidney Disease.    Anion gap 9 5 - 15    ABGS  Recent Labs  09/16/16 1825  PHART 7.441  PO2ART 52.9*  HCO3 33.0*   CULTURES Recent Results (from the past 240 hour(s))  MRSA PCR Screening     Status: None   Collection Time: 09/13/16  6:15 AM  Result Value Ref Range Status   MRSA by PCR NEGATIVE NEGATIVE Final    Comment:        The GeneXpert MRSA Assay (FDA approved for NASAL specimens only), is one component of a comprehensive MRSA colonization surveillance program. It is not intended to diagnose MRSA infection nor to guide or monitor treatment for MRSA infections.    Studies/Results: Dg Chest Port 1 View  Result Date: 09/16/2016 CLINICAL DATA:  Respiratory failure. EXAM: PORTABLE CHEST 1 VIEW COMPARISON:  09/13/2016 FINDINGS: Cardiomegaly. No confluent opacities, effusions or edema. No acute bony abnormality. IMPRESSION: Cardiomegaly.  No active disease. Electronically Signed   By: Rolm Baptise M.D.   On: 09/16/2016 13:30    Medications:  Prior to Admission:  Prescriptions Prior to Admission  Medication Sig Dispense Refill Last Dose  . acetaminophen (TYLENOL) 500 MG tablet Take 500 mg by mouth every 6 (six) hours as needed for mild pain. For pain    09/11/2016  . albuterol (PROVENTIL) (2.5 MG/3ML) 0.083% nebulizer solution Take 2.5 mg by nebulization 2 (two) times daily as needed.   09/12/2016 at Unknown time  . alendronate (FOSAMAX) 70 MG tablet Take 70 mg by mouth once a week. Take with a full glass of water on an empty stomach.   09/07/2016  . aspirin (ASPIRIN EC) 81 MG EC tablet Take 1 tablet (81 mg total) by mouth daily. Swallow whole.  09/12/2016 at Unknown time  . atorvastatin (LIPITOR) 40 MG tablet Take 40 mg by mouth. Monday, Wednesday, & Friday evening.   09/12/2016 at Unknown time  . Calcium Carbonate-Vit D-Min (CALTRATE 600+D PLUS) 600-400 MG-UNIT per tablet Take 1 tablet by mouth 2 (two) times daily.     09/12/2016 at Unknown time  . digoxin (LANOXIN) 0.25 MG tablet Take 0.025 mg by mouth every evening.   09/12/2016 at Unknown time  . diltiazem (CARDIZEM) 30 MG tablet Take 15 mg by mouth 2 (two) times daily.   09/12/2016 at Unknown time  . docusate sodium (COLACE) 100 MG capsule Take 100 mg by mouth at bedtime.   09/11/2016 at Unknown time  . furosemide (LASIX) 40 MG tablet Take 10 mg by mouth 2 (two) times daily.    09/12/2016 at Unknown time  . levETIRAcetam (KEPPRA) 250 MG tablet Takes one tablet in the morning and half a tablet at 3 pm.   09/12/2016 at Unknown time  . levothyroxine (SYNTHROID, LEVOTHROID) 25 MCG tablet Take 25 mcg by mouth daily before breakfast.   09/12/2016 at Unknown time  . midodrine (PROAMATINE) 2.5 MG tablet Take 1 tablet (2.5 mg total) by mouth 2 (two) times daily with a meal. The dosing can be titrated down to once daily when her systolic blood pressure is consistently in the 100s.   09/12/2016 at Unknown time  . Multiple Vitamins-Minerals (ICAPS) CAPS Take 1 capsule by mouth 2 (two) times daily.   09/12/2016 at Unknown time  . nitroGLYCERIN (NITROSTAT) 0.4 MG SL tablet Place 0.4 mg under the tongue every 5 (five) minutes as needed for chest pain.   unknown  . omeprazole (PRILOSEC OTC) 20 MG tablet Take 20 mg by mouth. Monday, Wednesday, & Friday evening.   09/11/2016  . potassium chloride (KLOR-CON) 10 MEQ CR tablet Take 10 mEq by mouth daily.     09/12/2016 at Unknown time  . predniSONE (DELTASONE) 10 MG tablet Take 4 tablets (40 mg total) by mouth daily. 20 tablet 0 09/11/2016  . Probiotic Product (PROBIOTIC DAILY PO) Take 1 tablet by mouth every morning.   09/11/2016   Scheduled: . aspirin EC  81 mg Oral  Daily  . atorvastatin  40 mg Oral q1800  . calcium-vitamin D  1 tablet Oral BID  . digoxin  0.125 mg Oral QPM  . docusate sodium  100 mg Oral QHS  . enoxaparin (LOVENOX) injection  40 mg Subcutaneous Q24H  . fluticasone  2 spray Each Nare Daily  . guaiFENesin  1,200 mg Oral BID  . ipratropium  0.5 mg Nebulization Q6H  . levalbuterol  0.63 mg Nebulization Q6H  . levETIRAcetam  125 mg Oral Q1500  . levETIRAcetam  250 mg Oral q morning - 10a  . levothyroxine  25 mcg Oral QAC breakfast  . loratadine  10 mg Oral Daily  . methylPREDNISolone (SOLU-MEDROL) injection  60 mg Intravenous Q6H  . midodrine  2.5 mg Oral BID WC  . multivitamin-lutein  1 capsule Oral BID  . senna  1 tablet Oral Daily   Continuous: . sodium chloride 20 mL/hr at 09/13/16 0648  . sodium chloride Stopped (09/14/16 2059)  . diltiazem (CARDIZEM) infusion Stopped (09/17/16 8675)  . piperacillin-tazobactam (ZOSYN)  IV 3.375 g (09/17/16 0750)   QGB:[id number], guaiFENesin-dextromethorphan, levalbuterol, nitroGLYCERIN, ondansetron (ZOFRAN) IV, sodium chloride  Assesment: She was admitted with asthma/COPD with acute exacerbation. She initially improved but now is much worse. At baseline she has some heart failure and that's been  treated but it doesn't seem to make much difference. She has atrial fib and has had rapid ventricular response (mostly from her respiratory situation. I think it is certainly a possibility that she has aspirated that caused her acute worsening so I think is reasonable to make her nothing by mouth. Speech consultation Principal Problem:   COPD with acute exacerbation ([place]) Active Problems:   Mixed hyperlipidemia   DIASTOLIC HEART FAILURE, CHRONIC   Hypotension   Alzheimer disease   Seizures (HCC)   Hypokalemia   Atrial fibrillation with RVR (HCC)   COPD exacerbation (HCC)    Plan: Continue current treatments. Nothing by mouth for now. Use BiPAP as needed. Discussed with Dr. Tyrell Antonio     LOS: 4 days   Lashundra Shiveley L 09/17/2016, 8:39 AM

## 2016-09-17 NOTE — Progress Notes (Addendum)
ANTIBIOTIC CONSULT NOTE-Preliminary  Pharmacy Consult for zosyn Indication: pneumonia  Allergies  Allergen Reactions  . Ivp Dye [Iodinated Diagnostic Agents] Shortness Of Breath    ??  . Omnipaque [Iohexol] Shortness Of Breath and Other (See Comments)    Short of breath with chest tightness after IV injection in CT, pt was fine when she left department but developed symptoms in parking lot and went to emergency department.  . Sulfa Antibiotics Shortness Of Breath  . Carbamazepine     REACTION: toxemia  . Dilaudid [Hydromorphone] Nausea And Vomiting  . Tramadol Other (See Comments)    confusion    Patient Measurements: Height:  (165.1 cm) Weight: 140 lb 3.4 oz (63.6 kg) IBW/kg (Calculated) : 57 Adjusted Body Weight:   Vital Signs: Temp: 98.1 F (36.7 C) (04/28 0400) Temp Source: Axillary (04/28 0400) BP: 111/90 (04/28 0500) Pulse Rate: 68 (04/28 0500)  Labs:  Recent Labs  09/14/16 0814 09/15/16 0440 09/16/16 0422 09/16/16 0747  WBC  --  20.2*  --  25.5*  HGB  --  14.9  --  15.4*  PLT  --  164  --  192  CREATININE 0.54 0.53 0.43*  --     Estimated Creatinine Clearance: 43.7 mL/min (A) (by C-G formula based on SCr of 0.43 mg/dL (L)).  No results for input(s): VANCOTROUGH, VANCOPEAK, VANCORANDOM, GENTTROUGH, GENTPEAK, GENTRANDOM, TOBRATROUGH, TOBRAPEAK, TOBRARND, AMIKACINPEAK, AMIKACINTROU, AMIKACIN in the last 72 hours.   Microbiology: Recent Results (from the past 720 hour(s))  MRSA PCR Screening     Status: None   Collection Time: 09/13/16  6:15 AM  Result Value Ref Range Status   MRSA by PCR NEGATIVE NEGATIVE Final    Comment:        The GeneXpert MRSA Assay (FDA approved for NASAL specimens only), is one component of a comprehensive MRSA colonization surveillance program. It is not intended to diagnose MRSA infection nor to guide or monitor treatment for MRSA infections.     Medical History: Past Medical History:  Diagnosis Date  .  Alzheimer disease   . Asthma   . Atrial fibrillation (HCC)    Permanent  . Chronic anticoagulation    Xarelto discontinued after fall  . Chronic diastolic heart failure (HCC)   . COPD (chronic obstructive pulmonary disease) (HCC)   . Coronary atherosclerosis of native coronary artery    BMS circumflex 2009  . Drug intolerance    Flecacinide and Amiodarone   . Fracture of multiple pubic rami (HCC) 12/13   Left superior and inferior - following fall  . Hypotension   . Mixed hyperlipidemia   . Retroperitoneal hemorrhage 12/13   Following fall  . Seizures (HCC)    Remote history of over 20 years ago  . Umbilical hernia     Medications:  Scheduled:  . aspirin EC  81 mg Oral Daily  . atorvastatin  40 mg Oral q1800  . calcium-vitamin D  1 tablet Oral BID  . digoxin  0.125 mg Oral QPM  . docusate sodium  100 mg Oral QHS  . enoxaparin (LOVENOX) injection  40 mg Subcutaneous Q24H  . fluticasone  2 spray Each Nare Daily  . furosemide  20 mg Oral Daily  . guaiFENesin  1,200 mg Oral BID  . ipratropium  0.5 mg Nebulization Q6H  . levalbuterol  0.63 mg Nebulization Q6H  . levETIRAcetam  125 mg Oral Q1500  . levETIRAcetam  250 mg Oral q morning - 10a  . levothyroxine  25  mcg Oral QAC breakfast  . loratadine  10 mg Oral Daily  . methylPREDNISolone (SOLU-MEDROL) injection  60 mg Intravenous Q6H  . midodrine  2.5 mg Oral BID WC  . multivitamin-lutein  1 capsule Oral BID  . senna  1 tablet Oral Daily   Infusions:  . sodium chloride 20 mL/hr at 09/13/16 0648  . sodium chloride Stopped (09/14/16 2059)  . diltiazem (CARDIZEM) infusion 5 mg/hr (09/17/16 0500)  . piperacillin-tazobactam (ZOSYN)  IV     PRN: acetaminophen, guaiFENesin-dextromethorphan, levalbuterol, nitroGLYCERIN, ondansetron (ZOFRAN) IV, sodium chloride Anti-infectives    Start     Dose/Rate Route Frequency Ordered Stop   09/17/16 0630  piperacillin-tazobactam (ZOSYN) IVPB 3.375 g     3.375 g 12.5 mL/hr over 240  Minutes Intravenous Every 8 hours 09/17/16 0615     09/14/16 1000  doxycycline (VIBRA-TABS) tablet 100 mg  Status:  Discontinued     100 mg Oral Every 12 hours 09/14/16 0832 09/17/16 0612      Assessment: 83 yo with HCAP requiring bipap starting zosyn.   Plan:  Preliminary review of pertinent patient information completed.  Protocol will be initiated with dose(s) of zosyn 3.375 grams Q8 hours.  Jeani Hawking clinical pharmacist will complete review during morning rounds to assess patient and finalize treatment regimen if needed.  [place], Tiffany Scarlett, RPH 09/17/2016,6:16 AM   Cont zosyn 3.375 gm IV q8 hours f/u renal function, cultures and clinical course Talbert Cage, PharmD

## 2016-09-17 NOTE — Progress Notes (Addendum)
PROGRESS NOTE    MADALEE ALTMANN  ZOX:[id number] DOB: [birth year] DOA: 09/13/2016 PCP: Ignatius Specking, MD    Brief Narrative:  Deborah [name] is a 83 y.o. female with medical history significant of Alzheimer's disease, asthma, atrial fibrillation, chronic diastolic heart failure, COPD, CAD, hypertension, mixed hyperlipidemia, mild history of seizures who is returning to the ER due to shortness of breath.  Per patient and her daughter, on Thursday, the patient got exposed to environmental allergens during the day subsequently developing wheezing and dyspnea. She used her MDI and nebulizer without significant relief. She had to come to the emergency department on Saturday for treatment. She was discharged home with oral prednisone. Symptoms improved but subsequently got worse and presented again to ED.   Assessment & Plan:   Principal Problem:   COPD with acute exacerbation (HCC) Active Problems:   Mixed hyperlipidemia   DIASTOLIC HEART FAILURE, CHRONIC   Hypotension   Alzheimer disease   Seizures (HCC)   Hypokalemia   Atrial fibrillation with RVR (HCC)   COPD exacerbation (HCC)  1-Acute COPD exacerbation; Acute hypoxic respiratory failure.  Thought initially to be Secondary to environmental factors. Worsening hypoxemia 4-27. treated with IV lasix, IV zosyn. Agree with concern for aspiration. NPO, speech swallow evaluation.  Continue with Xopenex, Ipratropium.  On IV solumedrol, lasix. IV zosyn.  Doxycycline started 4-25  for copd flare discontinue 4-28.  Appreciate Dr Juanetta Gosling help.  On BIPAP, IV Cardizem Gtt Discussed with daughter patient's critical condition, will continue IV antibiotics for now, BIPAP if absolutely necessary. Could use Morphine for work of breathing or if patient is in distress.   2-A fib RVR Continue with digoxin and started on Cardizem gtt.   3-Chronic Diastolic HF  Continue with  lasix.   4-hyperlipidemia; Continue with Lipitor.   5-Chronic hypotension;    Continue with midodrine.   6-Alzheimer;  At risk for delirium.   7-Seizure;  Continue with keppra.   8-Hypothyroidism;  Continue with synthroid.   Acute encephalopathy; related to medication, ativan, hospital delirium and hypoxemia Discontinue ativan.  Haldol PRN   Leukocytosis;  Suspect related to steroids.  X ray negative, IV zosyn started.   DVT prophylaxis: lovenox Code Status: DNR Family Communication: daughter  Disposition Plan:  Remain in the step down unit    Consultants:   Cardiology  Pulmonology    Procedures: none   Antimicrobials:   Doxycycline 4-25   Subjective: She is on BIPAP, ope yes, she was able to tell me that she is cold.    Objective: Vitals:   09/17/16 0530 09/17/16 0600 09/17/16 0722 09/17/16 0759  BP: 109/90 (!) 107/94    Pulse: (!) 132 (!) 145 (!) 145   Resp: (!) 27 (!) 24 20   Temp:   98 F (36.7 C)   TempSrc:   Axillary   SpO2: 93% 95% 93% 96%  Weight:      Height:        Intake/Output Summary (Last 24 hours) at 09/17/16 0820 Last data filed at 09/17/16 0541  Gross per 24 hour  Intake            271.5 ml  Output             1700 ml  Net          -1428.5 ml   Filed Weights   09/15/16 0500 09/16/16 0500 09/17/16 0400  Weight: 65 kg (143 lb 4.8 oz) 65.3 kg (143 lb 15.4 oz) 63.6 kg (  140 lb 3.4 oz)    Examination:  General exam ;On BIPAP, follows command.  Respiratory system: Bilateral wheezing, crackles, tachypnea.  Cardiovascular system: I RR S1 & S2 heard. positive JVD, murmurs, rubs, gallops or clicks. No pedal edema. Gastrointestinal system: Abdomen is nondistended, soft and nontender. No organomegaly or masses felt. Normal bowel sounds heard. Central nervous system: Alert and confuse. Moves all 4 extremities.  Extremities: Symmetric 5 x 5 power. Skin: No rashes, lesions or ulcers     Data Reviewed: I have personally reviewed following labs and imaging studies  CBC:  Recent Labs Lab 09/10/16 2030  09/13/16 0302 09/15/16 0440 09/16/16 0747 09/17/16 0549  WBC 6.3 9.5 20.2* 25.5* 24.3*  NEUTROABS 4.8 7.5  --   --   --   HGB 14.2 15.3* 14.9 15.4* 15.3*  HCT 44.4 46.9* 46.7* 46.6* 46.0  MCV 90.4 89.3 90.2 88.8 88.0  PLT 165 179 164 192 196   Basic Metabolic Panel:  Recent Labs Lab 09/13/16 0302 09/14/16 0814 09/15/16 0440 09/16/16 0422 09/17/16 0549  NA 136 136 137 134* 132*  K 3.4* 4.7 4.6 4.1 3.6  CL 97* 99* 99* 95* 87*  CO2 36*  GLUCOSE 105* 126* 127* 137* 132*  BUN 22*  CREATININE 0.57 0.54 0.53 0.43* 0.59  CALCIUM 9.3 9.2 9.4 9.0 8.8*  MG 2.1  --   --   --   --   PHOS 2.7  --   --   --   --    GFR: Estimated Creatinine Clearance: 43.7 mL/min (by C-G formula based on SCr of 0.59 mg/dL). Liver Function Tests:  Recent Labs Lab 09/10/16 2030  AST 28  ALT 19  ALKPHOS 71  BILITOT 0.5  PROT 7.0  ALBUMIN 4.3   No results for input(s): LIPASE, AMYLASE in the last 168 hours. No results for input(s): AMMONIA in the last 168 hours. Coagulation Profile: No results for input(s): INR, PROTIME in the last 168 hours. Cardiac Enzymes:  Recent Labs Lab 09/13/16 0302 09/13/16 0851 09/13/16 1506  TROPONINI 0.03* <0.03 <0.03   BNP (last 3 results) No results for input(s): PROBNP in the last 8760 hours. HbA1C: No results for input(s): HGBA1C in the last 72 hours. CBG: No results for input(s): GLUCAP in the last 168 hours. Lipid Profile: No results for input(s): CHOL, HDL, LDLCALC, TRIG, CHOLHDL, LDLDIRECT in the last 72 hours. Thyroid Function Tests: No results for input(s): TSH, T4TOTAL, FREET4, T3FREE, THYROIDAB in the last 72 hours. Anemia Panel: No results for input(s): VITAMINB12, FOLATE, FERRITIN, TIBC, IRON, RETICCTPCT in the last 72 hours. Sepsis Labs: No results for input(s): PROCALCITON, LATICACIDVEN in the last 168 hours.  Recent Results (from the past 240 hour(s))  MRSA PCR Screening     Status: None   Collection Time:  09/13/16  6:15 AM  Result Value Ref Range Status   MRSA by PCR NEGATIVE NEGATIVE Final    Comment:        The GeneXpert MRSA Assay (FDA approved for NASAL specimens only), is one component of a comprehensive MRSA colonization surveillance program. It is not intended to diagnose MRSA infection nor to guide or monitor treatment for MRSA infections.          Radiology Studies: Dg Chest Port 1 View  Result Date: 09/16/2016 CLINICAL DATA:  Respiratory failure. EXAM: PORTABLE CHEST 1 VIEW COMPARISON:  09/13/2016 FINDINGS: Cardiomegaly. No confluent opacities, effusions or edema. No acute bony abnormality. IMPRESSION:  Cardiomegaly.  No active disease. Electronically Signed   By: Charlett Nose M.D.   On: 09/16/2016 13:30        Scheduled Meds: . aspirin EC  81 mg Oral Daily  . atorvastatin  40 mg Oral q1800  . calcium-vitamin D  1 tablet Oral BID  . digoxin  0.125 mg Oral QPM  . docusate sodium  100 mg Oral QHS  . enoxaparin (LOVENOX) injection  40 mg Subcutaneous Q24H  . fluticasone  2 spray Each Nare Daily  . guaiFENesin  1,200 mg Oral BID  . ipratropium  0.5 mg Nebulization Q6H  . levalbuterol  0.63 mg Nebulization Q6H  . levETIRAcetam  125 mg Oral Q1500  . levETIRAcetam  250 mg Oral q morning - 10a  . levothyroxine  25 mcg Oral QAC breakfast  . loratadine  10 mg Oral Daily  . methylPREDNISolone (SOLU-MEDROL) injection  60 mg Intravenous Q6H  . midodrine  2.5 mg Oral BID WC  . multivitamin-lutein  1 capsule Oral BID  . senna  1 tablet Oral Daily   Continuous Infusions: . sodium chloride 20 mL/hr at 09/13/16 0648  . sodium chloride Stopped (09/14/16 2059)  . diltiazem (CARDIZEM) infusion Stopped (09/17/16 9147)  . piperacillin-tazobactam (ZOSYN)  IV 3.375 g (09/17/16 0750)     LOS: 4 days    Time spent: 35 minutes.     Alba Cory, MD [facility] Pager [phone]  If 7PM-7AM, please contact night-coverage [email or web address] Password  [email or web address] 09/17/2016, 8:20 AM

## 2016-09-18 DIAGNOSIS — J9602 Acute respiratory failure with hypercapnia: Secondary | ICD-10-CM

## 2016-09-18 DIAGNOSIS — J9601 Acute respiratory failure with hypoxia: Secondary | ICD-10-CM

## 2016-09-18 LAB — BASIC METABOLIC PANEL
ANION GAP: 11 (ref 5–15)
BUN: 29 mg/dL — ABNORMAL HIGH (ref 6–20)
CALCIUM: 8.8 mg/dL — AB (ref 8.9–10.3)
CO2: 35 mmol/L — ABNORMAL HIGH (ref 22–32)
CREATININE: 0.56 mg/dL (ref 0.44–1.00)
Chloride: 87 mmol/L — ABNORMAL LOW (ref 101–111)
Glucose, Bld: 102 mg/dL — ABNORMAL HIGH (ref 65–99)
Potassium: 3.1 mmol/L — ABNORMAL LOW (ref 3.5–5.1)
SODIUM: 133 mmol/L — AB (ref 135–145)

## 2016-09-18 LAB — CBC
HCT: 46.6 % — ABNORMAL HIGH (ref 36.0–46.0)
Hemoglobin: 15.4 g/dL — ABNORMAL HIGH (ref 12.0–15.0)
MCH: 28.7 pg (ref 26.0–34.0)
MCHC: 33 g/dL (ref 30.0–36.0)
MCV: 86.9 fL (ref 78.0–100.0)
PLATELETS: 184 10*3/uL (ref 150–400)
RBC: 5.36 MIL/uL — ABNORMAL HIGH (ref 3.87–5.11)
RDW: 14.2 % (ref 11.5–15.5)
WBC: 20.1 10*3/uL — AB (ref 4.0–10.5)

## 2016-09-18 LAB — MAGNESIUM: MAGNESIUM: 2.2 mg/dL (ref 1.7–2.4)

## 2016-09-18 MED ORDER — POTASSIUM CHLORIDE 10 MEQ/100ML IV SOLN
10.0000 meq | INTRAVENOUS | Status: AC
  Administered 2016-09-18 (×3): 10 meq via INTRAVENOUS
  Filled 2016-09-18 (×2): qty 100

## 2016-09-18 MED ORDER — ACETAMINOPHEN 325 MG PO TABS
650.0000 mg | ORAL_TABLET | Freq: Three times a day (TID) | ORAL | Status: DC
  Administered 2016-09-18 – 2016-09-21 (×10): 650 mg via ORAL
  Filled 2016-09-18 (×9): qty 2

## 2016-09-18 NOTE — Progress Notes (Signed)
Subjective: She is better this morning. Her daughter says she is leaning more toward palliative care now. She did choke on the day before her severe deterioration so I think aspiration is very likely. She did not have pneumonia on chest x-ray  Objective: Vital signs in last 24 hours: Temp:  [97.6 F (36.4 C)-98 F (36.7 C)] 97.8 F (36.6 C) (04/29 0721) Pulse Rate:  [37-157] 96 (04/29 1000) Resp:  [14-31] 22 (04/29 1000) BP: (84-121)/(60-95) 92/66 (04/29 1000) SpO2:  [86 %-96 %] 86 % (04/29 1000) Weight:  [63.9 kg (140 lb 14 oz)] 63.9 kg (140 lb 14 oz) (04/29 0500) Weight change: 0.3 kg (10.6 oz) Last BM Date: 09/16/16  Intake/Output from previous day: 04/28 0701 - 04/29 0700 In: 695.7 [P.O.:360; I.V.:148.2; IV Piggyback:187.5] Out: 1050 [Urine:1050]  PHYSICAL EXAM General appearance: She is more alert. On nasal oxygen. Not struggling as much to breathe Resp: rhonchi bilaterally Cardio: She is in atrial fibrillation rate around 110-120 GI: soft, non-tender; bowel sounds normal; no masses,  no organomegaly Extremities: extremities normal, atraumatic, no cyanosis or edema Skin warm and dry  Lab Results:  Results for orders placed or performed during the hospital encounter of 09/13/16 (from the past 48 hour(s))  Blood gas, arterial     Status: Abnormal   Collection Time: 09/16/16  6:25 PM  Result Value Ref Range   O2 Content 4.0 L/min   Delivery systems NASAL CANNULA    pH, Arterial 7.441 7.350 - 7.450   pCO2 arterial 53.2 (H) 32.0 - 48.0 mmHg    Comment: RBV E.SPANGLER,RN AT 1835 BY V.LAWSON,RRT ON 09/16/16   pO2, Arterial 52.9 (L) 83.0 - 108.0 mmHg    Comment: RBV E.SPANGLER,RN AT 1835 BY VLAWSON,RRT ON 09/16/16   Bicarbonate 33.0 (H) 20.0 - 28.0 mmol/L   Acid-Base Excess 10.9 (H) 0.0 - 2.0 mmol/L   O2 Saturation 87.8 %   Patient temperature 37.0    Collection site LEFT RADIAL    Drawn by [id number]    Sample type ARTERIAL    Allens test (pass/fail) PASS PASS  Brain  natriuretic peptide     Status: Abnormal   Collection Time: 09/16/16  7:24 PM  Result Value Ref Range   B Natriuretic Peptide 217.0 (H) 0.0 - 100.0 pg/mL  CBC     Status: Abnormal   Collection Time: 09/17/16  5:49 AM  Result Value Ref Range   WBC 24.3 (H) 4.0 - 10.5 K/uL   RBC 5.23 (H) 3.87 - 5.11 MIL/uL   Hemoglobin 15.3 (H) 12.0 - 15.0 g/dL   HCT 46.0 36.0 - 46.0 %   MCV 88.0 78.0 - 100.0 fL   MCH 29.3 26.0 - 34.0 pg   MCHC 33.3 30.0 - 36.0 g/dL   RDW 14.5 11.5 - 15.5 %   Platelets 196 150 - 400 K/uL  Basic metabolic panel     Status: Abnormal   Collection Time: 09/17/16  5:49 AM  Result Value Ref Range   Sodium 132 (L) 135 - 145 mmol/L   Potassium 3.6 3.5 - 5.1 mmol/L   Chloride 87 (L) 101 - 111 mmol/L   CO2 36 (H) 22 - 32 mmol/L   Glucose, Bld 132 (H) 65 - 99 mg/dL   BUN 22 (H) 6 - 20 mg/dL   Creatinine, Ser 0.59 0.44 - 1.00 mg/dL   Calcium 8.8 (L) 8.9 - 10.3 mg/dL   GFR calc non Af Amer >60 >60 mL/min   GFR calc Af Amer >60 >60  mL/min    Comment: (NOTE) The eGFR has been calculated using the CKD EPI equation. This calculation has not been validated in all clinical situations. eGFR's persistently <60 mL/min signify possible Chronic Kidney Disease.    Anion gap 9 5 - 15  CBC     Status: Abnormal   Collection Time: 09/18/16  7:39 AM  Result Value Ref Range   WBC 20.1 (H) 4.0 - 10.5 K/uL   RBC 5.36 (H) 3.87 - 5.11 MIL/uL   Hemoglobin 15.4 (H) 12.0 - 15.0 g/dL   HCT 46.6 (H) 36.0 - 46.0 %   MCV 86.9 78.0 - 100.0 fL   MCH 28.7 26.0 - 34.0 pg   MCHC 33.0 30.0 - 36.0 g/dL   RDW 14.2 11.5 - 15.5 %   Platelets 184 150 - 400 K/uL  Basic metabolic panel     Status: Abnormal   Collection Time: 09/18/16  7:39 AM  Result Value Ref Range   Sodium 133 (L) 135 - 145 mmol/L   Potassium 3.1 (L) 3.5 - 5.1 mmol/L   Chloride 87 (L) 101 - 111 mmol/L   CO2 35 (H) 22 - 32 mmol/L   Glucose, Bld 102 (H) 65 - 99 mg/dL   BUN 29 (H) 6 - 20 mg/dL   Creatinine, Ser 0.56 0.44 - 1.00 mg/dL    Calcium 8.8 (L) 8.9 - 10.3 mg/dL   GFR calc non Af Amer >60 >60 mL/min   GFR calc Af Amer >60 >60 mL/min    Comment: (NOTE) The eGFR has been calculated using the CKD EPI equation. This calculation has not been validated in all clinical situations. eGFR's persistently <60 mL/min signify possible Chronic Kidney Disease.    Anion gap 11 5 - 15  Magnesium     Status: None   Collection Time: 09/18/16  7:48 AM  Result Value Ref Range   Magnesium 2.2 1.7 - 2.4 mg/dL    ABGS  Recent Labs  09/16/16 1825  PHART 7.441  PO2ART 52.9*  HCO3 33.0*   CULTURES Recent Results (from the past 240 hour(s))  MRSA PCR Screening     Status: None   Collection Time: 09/13/16  6:15 AM  Result Value Ref Range Status   MRSA by PCR NEGATIVE NEGATIVE Final    Comment:        The GeneXpert MRSA Assay (FDA approved for NASAL specimens only), is one component of a comprehensive MRSA colonization surveillance program. It is not intended to diagnose MRSA infection nor to guide or monitor treatment for MRSA infections.    Studies/Results: Dg Chest Port 1 View  Result Date: 09/17/2016 CLINICAL DATA:  Hypoxemia EXAM: PORTABLE CHEST 1 VIEW COMPARISON:  09/16/2016 FINDINGS: The lungs are hyperinflated likely secondary to COPD. There is no focal parenchymal opacity. There is no pleural effusion or pneumothorax. There is stable cardiomegaly. The osseous structures are unremarkable. IMPRESSION: 1. COPD. 2. No acute cardiopulmonary disease. Electronically Signed   By: Kathreen Devoid   On: 09/17/2016 09:46   Dg Chest Port 1 View  Result Date: 09/16/2016 CLINICAL DATA:  Respiratory failure. EXAM: PORTABLE CHEST 1 VIEW COMPARISON:  09/13/2016 FINDINGS: Cardiomegaly. No confluent opacities, effusions or edema. No acute bony abnormality. IMPRESSION: Cardiomegaly.  No active disease. Electronically Signed   By: Rolm Baptise M.D.   On: 09/16/2016 13:30    Medications:  Prior to Admission:  Prescriptions Prior  to Admission  Medication Sig Dispense Refill Last Dose  . acetaminophen (TYLENOL) 500 MG tablet Take  500 mg by mouth every 6 (six) hours as needed for mild pain. For pain    09/11/2016  . albuterol (PROVENTIL) (2.5 MG/3ML) 0.083% nebulizer solution Take 2.5 mg by nebulization 2 (two) times daily as needed.   09/12/2016 at Unknown time  . alendronate (FOSAMAX) 70 MG tablet Take 70 mg by mouth once a week. Take with a full glass of water on an empty stomach.   09/07/2016  . aspirin (ASPIRIN EC) 81 MG EC tablet Take 1 tablet (81 mg total) by mouth daily. Swallow whole.   09/12/2016 at Unknown time  . atorvastatin (LIPITOR) 40 MG tablet Take 40 mg by mouth. Monday, Wednesday, & Friday evening.   09/12/2016 at Unknown time  . Calcium Carbonate-Vit D-Min (CALTRATE 600+D PLUS) 600-400 MG-UNIT per tablet Take 1 tablet by mouth 2 (two) times daily.     09/12/2016 at Unknown time  . digoxin (LANOXIN) 0.25 MG tablet Take 0.025 mg by mouth every evening.   09/12/2016 at Unknown time  . diltiazem (CARDIZEM) 30 MG tablet Take 15 mg by mouth 2 (two) times daily.   09/12/2016 at Unknown time  . docusate sodium (COLACE) 100 MG capsule Take 100 mg by mouth at bedtime.   09/11/2016 at Unknown time  . furosemide (LASIX) 40 MG tablet Take 10 mg by mouth 2 (two) times daily.    09/12/2016 at Unknown time  . levETIRAcetam (KEPPRA) 250 MG tablet Takes one tablet in the morning and half a tablet at 3 pm.   09/12/2016 at Unknown time  . levothyroxine (SYNTHROID, LEVOTHROID) 25 MCG tablet Take 25 mcg by mouth daily before breakfast.   09/12/2016 at Unknown time  . midodrine (PROAMATINE) 2.5 MG tablet Take 1 tablet (2.5 mg total) by mouth 2 (two) times daily with a meal. The dosing can be titrated down to once daily when her systolic blood pressure is consistently in the 100s.   09/12/2016 at Unknown time  . Multiple Vitamins-Minerals (ICAPS) CAPS Take 1 capsule by mouth 2 (two) times daily.   09/12/2016 at Unknown time  . nitroGLYCERIN  (NITROSTAT) 0.4 MG SL tablet Place 0.4 mg under the tongue every 5 (five) minutes as needed for chest pain.   unknown  . omeprazole (PRILOSEC OTC) 20 MG tablet Take 20 mg by mouth. Monday, Wednesday, & Friday evening.   09/11/2016  . potassium chloride (KLOR-CON) 10 MEQ CR tablet Take 10 mEq by mouth daily.     09/12/2016 at Unknown time  . predniSONE (DELTASONE) 10 MG tablet Take 4 tablets (40 mg total) by mouth daily. 20 tablet 0 09/11/2016  . Probiotic Product (PROBIOTIC DAILY PO) Take 1 tablet by mouth every morning.   09/11/2016   Scheduled: . acetaminophen  650 mg Oral TID  . aspirin EC  81 mg Oral Daily  . atorvastatin  40 mg Oral q1800  . calcium-vitamin D  1 tablet Oral BID  . digoxin  0.125 mg Oral QPM  . docusate sodium  100 mg Oral QHS  . enoxaparin (LOVENOX) injection  40 mg Subcutaneous Q24H  . fluticasone  2 spray Each Nare Daily  . guaiFENesin  1,200 mg Oral BID  . ipratropium  0.5 mg Nebulization TID  . levalbuterol  0.63 mg Nebulization TID  . levETIRAcetam  125 mg Oral Q1500  . levETIRAcetam  250 mg Oral q morning - 10a  . levothyroxine  25 mcg Oral QAC breakfast  . loratadine  10 mg Oral Daily  . methylPREDNISolone (SOLU-MEDROL) injection  60 mg Intravenous Q6H  . midodrine  2.5 mg Oral BID WC  . multivitamin-lutein  1 capsule Oral BID  . senna  1 tablet Oral Daily   Continuous: . sodium chloride 20 mL/hr at 09/13/16 0648  . sodium chloride Stopped (09/14/16 2059)  . diltiazem (CARDIZEM) infusion 5 mg/hr (09/18/16 0600)  . piperacillin-tazobactam (ZOSYN)  IV Stopped (09/18/16 1006)  . potassium chloride 10 mEq (09/18/16 1001)   UVJ:[id number], guaiFENesin-dextromethorphan, levalbuterol, morphine injection, nitroGLYCERIN, ondansetron (ZOFRAN) IV, sodium chloride  Assesment: She was admitted with asthma/COPD with exacerbation. I think she's probably aspirating. She also had atrial fibrillation with rapid ventricular response. At baseline she has Alzheimer's  disease. Her daughter is leaning more toward palliative care and I think that's appropriate. She does want to treat what is treatable but she's not interested in intubation and mechanical ventilation and would like to avoid BiPAP unless it's absolutely necessary Principal Problem:   COPD with acute exacerbation ([place]) Active Problems:   Mixed hyperlipidemia   DIASTOLIC HEART FAILURE, CHRONIC   Hypotension   Alzheimer disease   Seizures ([facility])   Hypokalemia   Atrial fibrillation with RVR (H[facility])   COPD exacerbation ([place])    Plan: As above    LOS: 5 days   Akiba Melfi L 09/18/2016, 10:16 AM

## 2016-09-18 NOTE — Progress Notes (Signed)
PROGRESS NOTE    Deborah [name]  EAV:[id number] DOB: 31-Dec-1925 DOA: 09/13/2016 PCP: Ignatius Specking, MD    Brief Narrative:  Deborah [name] is a 83 y.o. female with medical history significant of Alzheimer's disease, asthma, atrial fibrillation, chronic diastolic heart failure, COPD, CAD, hypertension, mixed hyperlipidemia, mild history of seizures who is returning to the ER due to shortness of breath.  Per patient and her daughter, on Thursday, the patient got exposed to environmental allergens during the day subsequently developing wheezing and dyspnea. She used her MDI and nebulizer without significant relief. She had to come to the emergency department on Saturday for treatment. She was discharged home with oral prednisone. Symptoms improved but subsequently got worse and presented again to ED.   Assessment & Plan:   Principal Problem:   COPD with acute exacerbation ([facility]) Active Problems:   Mixed hyperlipidemia   DIASTOLIC HEART FAILURE, CHRONIC   Hypotension   Alzheimer disease   Seizures (HCC)   Hypokalemia   Atrial fibrillation with RVR (H[facility])   COPD exacerbation ([facility])  1-Acute COPD exacerbation; Acute hypoxic respiratory failure.  Thought initially to be Secondary to environmental factors. Worsening hypoxemia 4-27. treated with IV lasix, IV zosyn. Agree with concern for aspiration. NPO, speech swallow evaluation.  Continue with Xopenex, Ipratropium.  On IV solumedrol, lasix. IV zosyn.  Appreciate Dr Juanetta Gosling help.  Off BIPAP on 5 L.  Hold lasix due to increase BUN.   2-A fib RVR Continue with digoxin and Cardizem Gtt.   3-Chronic Diastolic HF  Hold lasix., due to increase BUN, hypochloremic   4-hyperlipidemia; Continue with Lipitor.   5-Chronic hypotension;  Continue with midodrine.   6-Alzheimer;  At risk for delirium.   7-Seizure;  Continue with Keppra.   8-Hypothyroidism;  Continue with synthroid.   Acute encephalopathy; related to medication, ativan,  hospital delirium and hypoxemia Discontinue ativan.  Haldol PRN   Leukocytosis;  Suspect related to steroids.  X ray negative, Continue with IV zosyn.   DVT prophylaxis: lovenox Code Status: DNR Family Communication: daughter  Disposition Plan:  Remain in the step down unit    Consultants:   Cardiology  Pulmonology    Procedures: none   Antimicrobials:   Doxycycline 4-25 ----4-28.    Subjective: She report SOB, pain after coughing, she is really tired. Is less confuse.     Objective: Vitals:   09/18/16 0400 09/18/16 0500 09/18/16 0600 09/18/16 0721  BP: 116/84 (!) 121/95 (!) 118/95   Pulse: 94 (!) 42 (!) 101 (!) 124  Resp: 15   18  Temp: 97.6 F (36.4 C)   97.8 F (36.6 C)  TempSrc: Oral   Oral  SpO2: 93% 92% 94% 93%  Weight:  63.9 kg (140 lb 14 oz)    Height:        Intake/Output Summary (Last 24 hours) at 09/18/16 0743 Last data filed at 09/18/16 0600  Gross per 24 hour  Intake           695.67 ml  Output             1050 ml  Net          -354.33 ml   Filed Weights   09/16/16 0500 09/17/16 0400 09/18/16 0500  Weight: 65.3 kg (143 lb 15.4 oz) 63.6 kg (140 lb 3.4 oz) 63.9 kg (140 lb 14 oz)    Examination:  General exam ;NAD Respiratory system: Bilateral ronchus/  tachypnea. Ussing accessory muscle.  Cardiovascular system: I  RR S1 & S2 heard. positive JVD, murmurs, rubs, gallops or clicks. No pedal edema. Gastrointestinal system: Abdomen is soft, NT Central nervous system: Alert and confuse. Moves all 4 extremities.  Extremities: moves extremities.  Skin: No rashes, lesions or ulcers     Data Reviewed: I have personally reviewed following labs and imaging studies  CBC:  Recent Labs Lab 09/13/16 0302 09/15/16 0440 09/16/16 0747 09/17/16 0549  WBC 9.5 20.2* 25.5* 24.3*  NEUTROABS 7.5  --   --   --   HGB 15.3* 14.9 15.4* 15.3*  HCT 46.9* 46.7* 46.6* 46.0  MCV 89.3 90.2 88.8 88.0  PLT 179 164 192 196   Basic Metabolic  Panel:  Recent Labs Lab 09/13/16 0302 09/14/16 0814 09/15/16 0440 09/16/16 0422 09/17/16 0549  NA 136 136 137 134* 132*  K 3.4* 4.7 4.6 4.1 3.6  CL 97* 99* 99* 95* 87*  CO2 36*  GLUCOSE 105* 126* 127* 137* 132*  BUN 22*  CREATININE 0.57 0.54 0.53 0.43* 0.59  CALCIUM 9.3 9.2 9.4 9.0 8.8*  MG 2.1  --   --   --   --   PHOS 2.7  --   --   --   --    GFR: Estimated Creatinine Clearance: 43.7 mL/min (by C-G formula based on SCr of 0.59 mg/dL). Liver Function Tests: No results for input(s): AST, ALT, ALKPHOS, BILITOT, PROT, ALBUMIN in the last 168 hours. No results for input(s): LIPASE, AMYLASE in the last 168 hours. No results for input(s): AMMONIA in the last 168 hours. Coagulation Profile: No results for input(s): INR, PROTIME in the last 168 hours. Cardiac Enzymes:  Recent Labs Lab 09/13/16 0302 09/13/16 0851 09/13/16 1506  TROPONINI 0.03* <0.03 <0.03   BNP (last 3 results) No results for input(s): PROBNP in the last 8760 hours. HbA1C: No results for input(s): HGBA1C in the last 72 hours. CBG: No results for input(s): GLUCAP in the last 168 hours. Lipid Profile: No results for input(s): CHOL, HDL, LDLCALC, TRIG, CHOLHDL, LDLDIRECT in the last 72 hours. Thyroid Function Tests: No results for input(s): TSH, T4TOTAL, FREET4, T3FREE, THYROIDAB in the last 72 hours. Anemia Panel: No results for input(s): VITAMINB12, FOLATE, FERRITIN, TIBC, IRON, RETICCTPCT in the last 72 hours. Sepsis Labs: No results for input(s): PROCALCITON, LATICACIDVEN in the last 168 hours.  Recent Results (from the past 240 hour(s))  MRSA PCR Screening     Status: None   Collection Time: 09/13/16  6:15 AM  Result Value Ref Range Status   MRSA by PCR NEGATIVE NEGATIVE Final    Comment:        The GeneXpert MRSA Assay (FDA approved for NASAL specimens only), is one component of a comprehensive MRSA colonization surveillance program. It is not intended to diagnose  MRSA infection nor to guide or monitor treatment for MRSA infections.          Radiology Studies: Dg Chest Port 1 View  Result Date: 09/17/2016 CLINICAL DATA:  Hypoxemia EXAM: PORTABLE CHEST 1 VIEW COMPARISON:  09/16/2016 FINDINGS: The lungs are hyperinflated likely secondary to COPD. There is no focal parenchymal opacity. There is no pleural effusion or pneumothorax. There is stable cardiomegaly. The osseous structures are unremarkable. IMPRESSION: 1. COPD. 2. No acute cardiopulmonary disease. Electronically Signed   By: Elige Ko   On: 09/17/2016 09:46   Dg Chest Port 1 View  Result Date: 09/16/2016 CLINICAL DATA:  Respiratory failure. EXAM: PORTABLE CHEST 1  VIEW COMPARISON:  09/13/2016 FINDINGS: Cardiomegaly. No confluent opacities, effusions or edema. No acute bony abnormality. IMPRESSION: Cardiomegaly.  No active disease. Electronically Signed   By: Charlett Nose M.D.   On: 09/16/2016 13:30        Scheduled Meds: . aspirin EC  81 mg Oral Daily  . atorvastatin  40 mg Oral q1800  . calcium-vitamin D  1 tablet Oral BID  . digoxin  0.125 mg Oral QPM  . docusate sodium  100 mg Oral QHS  . enoxaparin (LOVENOX) injection  40 mg Subcutaneous Q24H  . fluticasone  2 spray Each Nare Daily  . furosemide  20 mg Oral Daily  . guaiFENesin  1,200 mg Oral BID  . ipratropium  0.5 mg Nebulization TID  . levalbuterol  0.63 mg Nebulization TID  . levETIRAcetam  125 mg Oral Q1500  . levETIRAcetam  250 mg Oral q morning - 10a  . levothyroxine  25 mcg Oral QAC breakfast  . loratadine  10 mg Oral Daily  . methylPREDNISolone (SOLU-MEDROL) injection  60 mg Intravenous Q6H  . midodrine  2.5 mg Oral BID WC  . multivitamin-lutein  1 capsule Oral BID  . senna  1 tablet Oral Daily   Continuous Infusions: . sodium chloride 20 mL/hr at 09/13/16 0648  . sodium chloride Stopped (09/14/16 2059)  . diltiazem (CARDIZEM) infusion 5 mg/hr (09/18/16 0600)  . piperacillin-tazobactam (ZOSYN)  IV 3.375 g  (09/18/16 0600)     LOS: 5 days    Time spent: 35 minutes.     Alba Cory, MD [facility] Pager [phone]  If 7PM-7AM, please contact night-coverage [email or web address] Password [facility] 09/18/2016, 7:43 AM

## 2016-09-19 DIAGNOSIS — R0902 Hypoxemia: Secondary | ICD-10-CM

## 2016-09-19 LAB — BASIC METABOLIC PANEL
ANION GAP: 10 (ref 5–15)
BUN: 27 mg/dL — ABNORMAL HIGH (ref 6–20)
CO2: 33 mmol/L — AB (ref 22–32)
Calcium: 8.8 mg/dL — ABNORMAL LOW (ref 8.9–10.3)
Chloride: 89 mmol/L — ABNORMAL LOW (ref 101–111)
Creatinine, Ser: 0.56 mg/dL (ref 0.44–1.00)
GFR calc Af Amer: >60 mL/min (ref >60)
GFR calc non Af Amer: >60 mL/min (ref >60)
GLUCOSE: 115 mg/dL — AB (ref 65–99)
POTASSIUM: 3.4 mmol/L — AB (ref 3.5–5.1)
Sodium: 132 mmol/L — ABNORMAL LOW (ref 135–145)

## 2016-09-19 LAB — CBC
HEMATOCRIT: 46.9 % — AB (ref 36.0–46.0)
Hemoglobin: 15.6 g/dL — ABNORMAL HIGH (ref 12.0–15.0)
MCH: 28.9 pg (ref 26.0–34.0)
MCHC: 33.3 g/dL (ref 30.0–36.0)
MCV: 86.9 fL (ref 78.0–100.0)
Platelets: 198 10*3/uL (ref 150–400)
RBC: 5.4 MIL/uL — AB (ref 3.87–5.11)
RDW: 14.2 % (ref 11.5–15.5)
WBC: 20.2 10*3/uL — AB (ref 4.0–10.5)

## 2016-09-19 LAB — GLUCOSE, CAPILLARY: Glucose-Capillary: 119 mg/dL — ABNORMAL HIGH (ref 65–99)

## 2016-09-19 LAB — OCCULT BLOOD X 1 CARD TO LAB, STOOL: Fecal Occult Bld: POSITIVE — AB

## 2016-09-19 MED ORDER — PANTOPRAZOLE SODIUM 40 MG IV SOLR
40.0000 mg | Freq: Two times a day (BID) | INTRAVENOUS | Status: DC
  Administered 2016-09-19 – 2016-09-21 (×5): 40 mg via INTRAVENOUS
  Filled 2016-09-19 (×5): qty 40

## 2016-09-19 MED ORDER — ENSURE ENLIVE PO LIQD
237.0000 mL | Freq: Two times a day (BID) | ORAL | Status: DC
  Administered 2016-09-19 – 2016-09-20 (×4): 237 mL via ORAL

## 2016-09-19 MED ORDER — CHLORHEXIDINE GLUCONATE 0.12 % MT SOLN
15.0000 mL | Freq: Two times a day (BID) | OROMUCOSAL | Status: DC
  Administered 2016-09-19 – 2016-09-21 (×4): 15 mL via OROMUCOSAL
  Filled 2016-09-19 (×4): qty 15

## 2016-09-19 MED ORDER — ORAL CARE MOUTH RINSE
15.0000 mL | Freq: Two times a day (BID) | OROMUCOSAL | Status: DC
  Administered 2016-09-20 (×2): 15 mL via OROMUCOSAL

## 2016-09-19 MED ORDER — DIGOXIN 0.25 MG/ML IJ SOLN
0.5000 mg | Freq: Once | INTRAMUSCULAR | Status: AC
  Administered 2016-09-19: 0.5 mg via INTRAVENOUS
  Filled 2016-09-19: qty 2

## 2016-09-19 MED ORDER — METHYLPREDNISOLONE SODIUM SUCC 125 MG IJ SOLR
60.0000 mg | Freq: Three times a day (TID) | INTRAMUSCULAR | Status: DC
  Administered 2016-09-19 – 2016-09-20 (×3): 60 mg via INTRAVENOUS
  Filled 2016-09-19 (×3): qty 2

## 2016-09-19 MED ORDER — POTASSIUM CHLORIDE 10 MEQ/100ML IV SOLN
10.0000 meq | INTRAVENOUS | Status: AC
Start: 2016-09-19 — End: 2016-09-19
  Administered 2016-09-19 (×3): 10 meq via INTRAVENOUS
  Filled 2016-09-19: qty 100

## 2016-09-19 NOTE — Evaluation (Signed)
Clinical/Bedside Swallow Evaluation Patient Details  Name: Deborah [name] MRN: [id number] Date of Birth: Dec 14, 1925  Today's Date: 09/19/2016 Time: SLP Start Time (ACUTE ONLY): 1600 SLP Stop Time (ACUTE ONLY): 1626 SLP Time Calculation (min) (ACUTE ONLY): 26 min  Past Medical History:  Past Medical History:  Diagnosis Date  . Alzheimer disease   . Asthma   . Atrial fibrillation (HCC)    Permanent  . Chronic anticoagulation    Xarelto discontinued after fall  . Chronic diastolic heart failure (HCC)   . COPD (chronic obstructive pulmonary disease) (HCC)   . Coronary atherosclerosis of native coronary artery    BMS circumflex 2009  . Drug intolerance    Flecacinide and Amiodarone   . Fracture of multiple pubic rami (HCC) 12/13   Left superior and inferior - following fall  . Hypotension   . Mixed hyperlipidemia   . Retroperitoneal hemorrhage 12/13   Following fall  . Seizures (HCC)    Remote history of over 20 years ago  . Umbilical hernia    Past Surgical History:  Past Surgical History:  Procedure Laterality Date  . CATARACT EXTRACTION    . Radiofrequency catheter ablation     Failed  . Right carpel tunnel release    . TONSILLECTOMY     HPI:  Deborah [name] is an 83 yo female who typically resides at [facility] a history of chronic atrial fibrillation (not anticoagulated due to fall risk and prior history of hemorrhage), chronic hypotension requiring midodrine, previous intolerance to amiodarone and flecainide, also symptomatically stable CAD. She is currently admitted to the hospital with respiratory failure in the setting of COPD exacerbation. Atrial fibrillation heart rate has been elevated in this setting. Pt reportedly may have aspirated on Friday when taking medications. Chest x-ray from Saturday shows: The lungs are hyperinflated likely secondary to COPD. There is no focal parenchymal opacity. There is no pleural effusion or pneumothorax. There is stable cardiomegaly.  MD ordered BSE. Pt has been NPO except for sips of water since Saturday. Her daughter reports poor po intake for over a week.   Assessment / Plan / Recommendation Clinical Impression  Pt seen at bedside for clinical swallow evaluation with daughter present in room. Pt and daughter deny history of dysphagia or pneumonia. Pt does endorse significant chest congestion following working out in the garden at [employer]. Oral motor examination is unremarkable. Pt with upper and lower dentures. Oral cavity clean and moist. Pt able to produce strong cough (congested prior to po). Pt consumed cup and straw sips of thin water (8 oz), 4 oz applesauce, and some graham crackers without overt s/sx aspiration. Pt does present with occasional delayed cough, however this has been present throughout the day per pt/daughter and also noted by SLP prior to po trials today. Pt feels she would be able to consume some softer foods for now. Will initiate D2/chopped with thin liquids when pt is alert and upright. PO medications whole in puree. OK for straws. SLP will follow for diet tolerance, upgrades, and assess need for objective assessment. Pt/daughter in agreement. Above to RN.  SLP Visit Diagnosis: Dysphagia, oropharyngeal phase (R13.12)    Aspiration Risk  Mild aspiration risk    Diet Recommendation Dysphagia 2 (Fine chop);Thin liquid   Liquid Administration via: Cup;Straw Medication Administration: Whole meds with puree Supervision: Patient able to self feed;Full supervision/cueing for compensatory strategies Compensations: Slow rate;Small sips/bites Postural Changes: Seated upright at 90 degrees;Remain upright for at least 30 minutes after po intake  Other  Recommendations Oral Care Recommendations: Oral care BID;Staff/trained caregiver to provide oral care Other Recommendations: Clarify dietary restrictions   Follow up Recommendations 24 hour supervision/assistance      Frequency and Duration min 2x/week  1  week       Prognosis Prognosis for Safe Diet Advancement: Good Barriers to Reach Goals:  (respiratory status)      Swallow Study   General Date of Onset: 09/13/16 HPI: Deborah [name] is an 83 yo female who typically resides at [facility] a history of chronic atrial fibrillation (not anticoagulated due to fall risk and prior history of hemorrhage), chronic hypotension requiring midodrine, previous intolerance to amiodarone and flecainide, also symptomatically stable CAD. She is currently admitted to the hospital with respiratory failure in the setting of COPD exacerbation. Atrial fibrillation heart rate has been elevated in this setting. Pt reportedly may have aspirated on Friday when taking medications. Chest x-ray from Saturday shows: The lungs are hyperinflated likely secondary to COPD. There is no focal parenchymal opacity. There is no pleural effusion or pneumothorax. There is stable cardiomegaly. MD ordered BSE. Pt has been NPO except for sips of water since Saturday. Her daughter reports poor po intake for over a week. Type of Study: Bedside Swallow Evaluation Previous Swallow Assessment: none on record Diet Prior to this Study: NPO Temperature Spikes Noted: No Respiratory Status: Nasal cannula History of Recent Intubation: No Behavior/Cognition: Alert;Cooperative;Pleasant mood Oral Cavity Assessment: Within Functional Limits Oral Care Completed by SLP: No Oral Cavity - Dentition: Dentures, top;Dentures, bottom Vision: Impaired for self-feeding Self-Feeding Abilities: Able to feed self;Needs set up (low vision) Patient Positioning: Upright in bed Baseline Vocal Quality: Normal (audible chest congestion prior to po) Volitional Cough: Strong;Congested Volitional Swallow: Able to elicit    Oral/Motor/Sensory Function Overall Oral Motor/Sensory Function: Within functional limits   Ice Chips Ice chips: Within functional limits Presentation: Spoon   Thin Liquid Thin Liquid: Within  functional limits Presentation: Cup;Self Fed;Straw    Nectar Thick Nectar Thick Liquid: Not tested   Honey Thick Honey Thick Liquid: Not tested   Puree Puree: Within functional limits Presentation: Spoon   Solid   Thank you,  Havery Moros, CCC-SLP [phone]    Solid: Within functional limits Presentation: Self Fed Other Comments:  (Pt reports recent fear of choking due to significant congest)        Deborah [name] 09/19/2016,4:45 PM

## 2016-09-19 NOTE — [facility] Notes (Signed)
PROGRESS NOTE    Deborah [name]  ZOX:[id number] DOB: 18-Jul-1925 DOA: 09/13/2016 PCP: Ignatius Specking, MD    Brief Narrative:  Deborah [name] is a 83 y.o. female with medical history significant of Alzheimer's disease, asthma, atrial fibrillation, chronic diastolic heart failure, COPD, CAD, hypertension, mixed hyperlipidemia, mild history of seizures who is returning to the ER due to shortness of breath.  Per patient and her daughter, on Thursday, the patient got exposed to environmental allergens during the day subsequently developing wheezing and dyspnea. She used her MDI and nebulizer without significant relief. She had to come to the emergency department on Saturday for treatment. She was discharged home with oral prednisone. Symptoms improved but subsequently got worse and presented again to ED.   Assessment & Plan:   Principal Problem:   COPD with acute exacerbation (HCC) Active Problems:   Mixed hyperlipidemia   DIASTOLIC HEART FAILURE, CHRONIC   Hypotension   Alzheimer disease   Seizures (HCC)   Hypokalemia   Atrial fibrillation with RVR (HCC)   COPD exacerbation (HCC)  1-Acute COPD exacerbation; Acute hypoxic respiratory failure.  Thought initially to be Secondary to environmental factors. Worsening hypoxemia 4-27. treated with IV lasix, IV zosyn. Concern with episode of aspiration.  -Awaiting speech evaluation.  Continue with Xopenex, Ipratropium.  On IV solumedrol will start steroid taper today.  Holding lasix due to dehydration.  Continue with IV zosyn.  Appreciate Dr Juanetta Gosling help.  Off BIPAP. Oxygen requirement decreased to 3 L.   2-A fib RVR Continue with digoxin and Cardizem Gtt.  Cardiology following.   3-Diarrhea, Black stool. Occult blood positive.  Will discontinue laxatives. Discontinue aspirin and Lovenox.  Start IV Protonix. Follow hb.   4-Chronic Diastolic HF  Hold lasix., due to increase BUN, hypochloremic   5-hyperlipidemia; Continue with Lipitor.    6-Chronic hypotension;  Continue with midodrine.   7-Alzheimer;  At risk for delirium.   8-Seizure;  Continue with Keppra.   9-Hypothyroidism;  Continue with synthroid.   Acute encephalopathy; related to medication, ativan, hospital delirium and hypoxemia Discontinue ativan.  Haldol PRN   Leukocytosis;  Suspect related to steroids.  X ray negative, Continue with IV zosyn.   Hypokalemia; replete IV   DVT prophylaxis: lovenox Code Status: DNR Family Communication: daughter  Disposition Plan:  Remain in the step down unit    Consultants:   Cardiology  Pulmonology    Procedures: none   Antimicrobials:   Doxycycline 4-25 ----4-28.    Subjective: She is sitting in stool, black color. Taking laxative. Think she is breathing ok.  Confuse.    Objective: Vitals:   09/19/16 0400 09/19/16 0500 09/19/16 0600 09/19/16 0733  BP: 112/73 123/78 130/73   Pulse: 85 84 90   Resp: Temp: 97.6 F (36.4 C)     TempSrc: Oral     SpO2: 91% 97% 97% 97%  Weight:  63.9 kg (140 lb 14 oz)    Height:        Intake/Output Summary (Last 24 hours) at 09/19/16 0822 Last data filed at 09/19/16 0600  Gross per 24 hour  Intake              846 ml  Output             1150 ml  Net             -304 ml   Filed Weights   09/17/16 0400 09/18/16 0500 09/19/16 0500  Weight:  63.6 kg (140 lb 3.4 oz) 63.9 kg (140 lb 14 oz) 63.9 kg (140 lb 14 oz)    Examination:  General exam ; NAD. 3 L oxygen  Respiratory system: Bilateral ronchus/  tachypnea. Mild Use of  accessory muscle.  Cardiovascular system: I RR S1 & S2 heard. No edema Gastrointestinal system: soft, nt  Central nervous system: Alert and confuse. Moves all 4 extremities.  Extremities: moves extremities.  Skin: No rashes, lesions or ulcers     Data Reviewed: I have personally reviewed following labs and imaging studies  CBC:  Recent Labs Lab 09/13/16 0302 09/15/16 0440 09/16/16 0747 09/17/16 0549  09/18/16 0739 09/19/16 0628  WBC 9.5 20.2* 25.5* 24.3* 20.1* 20.2*  NEUTROABS 7.5  --   --   --   --   --   HGB 15.3* 14.9 15.4* 15.3* 15.4* 15.6*  HCT 46.9* 46.7* 46.6* 46.0 46.6* 46.9*  MCV 89.3 90.2 88.8 88.0 86.9 86.9  PLT 179 164 192 196 184 198   Basic Metabolic Panel:  Recent Labs Lab 09/13/16 0302  09/15/16 0440 09/16/16 0422 09/17/16 0549 09/18/16 0739 09/18/16 0748 09/19/16 0628  NA 136  < > 137 134* 132* 133*  --  132*  K 3.4*  < > 4.6 4.1 3.6 3.1*  --  3.4*  CL 97*  < > 99* 95* 87* 87*  --  89*  CO2 30  < > 30 29 36* 35*  --  33*  GLUCOSE 105*  < > 127* 137* 132* 102*  --  115*  BUN 12  < > 19 18 22* 29*  --  27*  CREATININE 0.57  < > 0.53 0.43* 0.59 0.56  --  0.56  CALCIUM 9.3  < > 9.4 9.0 8.8* 8.8*  --  8.8*  MG 2.1  --   --   --   --   --  2.2  --   PHOS 2.7  --   --   --   --   --   --   --   < > = values in this interval not displayed. GFR: Estimated Creatinine Clearance: 43.7 mL/min (by C-G formula based on SCr of 0.56 mg/dL). Liver Function Tests: No results for input(s): AST, ALT, ALKPHOS, BILITOT, PROT, ALBUMIN in the last 168 hours. No results for input(s): LIPASE, AMYLASE in the last 168 hours. No results for input(s): AMMONIA in the last 168 hours. Coagulation Profile: No results for input(s): INR, PROTIME in the last 168 hours. Cardiac Enzymes:  Recent Labs Lab 09/13/16 0302 09/13/16 0851 09/13/16 1506  TROPONINI 0.03* <0.03 <0.03   BNP (last 3 results) No results for input(s): PROBNP in the last 8760 hours. HbA1C: No results for input(s): HGBA1C in the last 72 hours. CBG: No results for input(s): GLUCAP in the last 168 hours. Lipid Profile: No results for input(s): CHOL, HDL, LDLCALC, TRIG, CHOLHDL, LDLDIRECT in the last 72 hours. Thyroid Function Tests: No results for input(s): TSH, T4TOTAL, FREET4, T3FREE, THYROIDAB in the last 72 hours. Anemia Panel: No results for input(s): VITAMINB12, FOLATE, FERRITIN, TIBC, IRON, RETICCTPCT  in the last 72 hours. Sepsis Labs: No results for input(s): PROCALCITON, LATICACIDVEN in the last 168 hours.  Recent Results (from the past 240 hour(s))  MRSA PCR Screening     Status: None   Collection Time: 09/13/16  6:15 AM  Result Value Ref Range Status   MRSA by PCR NEGATIVE NEGATIVE Final    Comment:  The GeneXpert MRSA Assay (FDA approved for NASAL specimens only), is one component of a comprehensive MRSA colonization surveillance program. It is not intended to diagnose MRSA infection nor to guide or monitor treatment for MRSA infections.          Radiology Studies: No results found.      Scheduled Meds: . acetaminophen  650 mg Oral TID  . aspirin EC  81 mg Oral Daily  . atorvastatin  40 mg Oral q1800  . calcium-vitamin D  1 tablet Oral BID  . digoxin  0.125 mg Oral QPM  . docusate sodium  100 mg Oral QHS  . enoxaparin (LOVENOX) injection  40 mg Subcutaneous Q24H  . fluticasone  2 spray Each Nare Daily  . guaiFENesin  1,200 mg Oral BID  . ipratropium  0.5 mg Nebulization TID  . levalbuterol  0.63 mg Nebulization TID  . levETIRAcetam  125 mg Oral Q1500  . levETIRAcetam  250 mg Oral q morning - 10a  . levothyroxine  25 mcg Oral QAC breakfast  . loratadine  10 mg Oral Daily  . methylPREDNISolone (SOLU-MEDROL) injection  60 mg Intravenous Q8H  . midodrine  2.5 mg Oral BID WC  . multivitamin-lutein  1 capsule Oral BID  . senna  1 tablet Oral Daily   Continuous Infusions: . sodium chloride 20 mL/hr at 09/13/16 0648  . sodium chloride 10 mL/hr at 09/19/16 0600  . diltiazem (CARDIZEM) infusion 6 mg/hr (09/19/16 0600)  . piperacillin-tazobactam (ZOSYN)  IV 3.375 g (09/19/16 2956)  . potassium chloride       LOS: 6 days    Time spent: 35 minutes.     Alba Cory, MD [facility] Pager [phone]  If 7PM-7AM, please contact night-coverage [email or web address] Password [email or web address] 09/19/2016, 8:22 AM

## 2016-09-19 NOTE — [facility] Notes (Signed)
0847 stool specimen collected and sent to lab for hemoccult stool as ordered.

## 2016-09-19 NOTE — [facility] Notes (Signed)
Patient did well at dinner time this evening. Able to sit up and feed herself with tray set-up and observation. Consumed 100% of her chicken salad and 100% of her ice cream.

## 2016-09-19 NOTE — Clinical Social Work Note (Signed)
LCSW discussed with patient discharge planning for when she is medically stable.    Patient continues to desire to return to [facility] at discharge.     Aundra Espin, Juleen China, LCSW

## 2016-09-19 NOTE — [facility] Notes (Addendum)
Progress Note  Patient Name: Deborah [name] Date of Encounter: 09/19/2016  Primary Cardiologist: Dr. Jonelle Sidle  Subjective   Complaining of pain at IV site.   Inpatient Medications    Scheduled Meds: . acetaminophen  650 mg Oral TID  . atorvastatin  40 mg Oral q1800  . calcium-vitamin D  1 tablet Oral BID  . digoxin  0.125 mg Oral QPM  . feeding supplement (ENSURE ENLIVE)  237 mL Oral BID BM  . fluticasone  2 spray Each Nare Daily  . guaiFENesin  1,200 mg Oral BID  . ipratropium  0.5 mg Nebulization TID  . levalbuterol  0.63 mg Nebulization TID  . levETIRAcetam  125 mg Oral Q1500  . levETIRAcetam  250 mg Oral q morning - 10a  . levothyroxine  25 mcg Oral QAC breakfast  . loratadine  10 mg Oral Daily  . methylPREDNISolone (SOLU-MEDROL) injection  60 mg Intravenous Q8H  . midodrine  2.5 mg Oral BID WC  . multivitamin-lutein  1 capsule Oral BID   Continuous Infusions: . sodium chloride 20 mL/hr at 09/13/16 0648  . sodium chloride 10 mL/hr at 09/19/16 0600  . diltiazem (CARDIZEM) infusion 6 mg/hr (09/19/16 4098)  . piperacillin-tazobactam (ZOSYN)  IV Stopped (09/19/16 1033)   PRN Meds: acetaminophen, guaiFENesin-dextromethorphan, levalbuterol, morphine injection, nitroGLYCERIN, ondansetron (ZOFRAN) IV, sodium chloride   Vital Signs    Vitals:   09/19/16 0500 09/19/16 0600 09/19/16 0733 09/19/16 0800  BP: 123/78 130/73    Pulse: 84 90    Resp: 18 16    Temp:    97.8 F (36.6 C)  TempSrc:    Oral  SpO2: 97% 97% 97%   Weight: 140 lb 14 oz (63.9 kg)     Height:        Intake/Output Summary (Last 24 hours) at 09/19/16 1119 Last data filed at 09/19/16 0600  Gross per 24 hour  Intake            825.5 ml  Output             1150 ml  Net           -324.5 ml   Filed Weights   09/17/16 0400 09/18/16 0500 09/19/16 0500  Weight: 140 lb 3.4 oz (63.6 kg) 140 lb 14 oz (63.9 kg) 140 lb 14 oz (63.9 kg)    Telemetry    Atrial fibrillation with salvos of atrial  tachycardia, returning to atrial fib, HR's in the 90's. . Personally reviewed.  Physical Exam   GEN: Elderly woman, no acute distress.   Neck: No JVD. Cardiac:  Irregularly irregular, no gallop.  Respiratory:  Scattered rhonchi with some inspiratory wheezing upper airway.  No coughing GI: Soft, nontender, bowel sounds present. MS: No edema; No deformity.  Labs    Chemistry  Recent Labs Lab 09/17/16 0549 09/18/16 0739 09/19/16 0628  NA 132* 133* 132*  K 3.6 3.1* 3.4*  CL 87* 87* 89*  CO2 36* 35* 33*  GLUCOSE 132* 102* 115*  BUN 22* 29* 27*  CREATININE 0.59 0.56 0.56  CALCIUM 8.8* 8.8* 8.8*  GFRNONAA >60 >60 >60  GFRAA >60 >60 >60  ANIONGAP Hematology  Recent Labs Lab 09/17/16 0549 09/18/16 0739 09/19/16 0628  WBC 24.3* 20.1* 20.2*  RBC 5.23* 5.36* 5.40*  HGB 15.3* 15.4* 15.6*  HCT 46.0 46.6* 46.9*  MCV 88.0 86.9 86.9  MCH 29.3 28.7 28.9  MCHC 33.3 33.0 33.3  RDW 14.5 14.2 14.2  PLT 196 184 198    Cardiac Enzymes  Recent Labs Lab 09/13/16 0302 09/13/16 0851 09/13/16 1506  TROPONINI 0.03* <0.03 <0.03   No results for input(s): TROPIPOC in the last 168 hours.   BNP  Recent Labs Lab 09/13/16 0302 09/16/16 1924  BNP 134.0* 217.0*     Radiology    Chest x-ray 09/13/2016: FINDINGS: There is emphysematous changes of the lungs with interstitial coarsening. No focal consolidation, pleural effusion, or pneumothorax. Top-normal cardiac silhouette. No acute osseous pathology.  IMPRESSION: 1. No acute cardiopulmonary process. 2. Emphysema.  Patient Profile     83 y.o. female with a history of chronic atrial fibrillation (not anticoagulated due to fall risk and prior history of hemorrhage), chronic hypotension requiring midodrine, previous intolerance to amiodarone and flecainide, also symptomatically stable CAD. She is currently admitted to the hospital with respiratory failure in the setting of COPD exacerbation. Atrial fibrillation  heart rate has been elevated in this setting.  Assessment & Plan    1. Chronic atrial fibrillation with recent RVR in the setting of COPD exacerbation. History of chronic hypotension on midodrine has limited high-dose rate control medications as an outpatient. Has rapid HR over the weekend with reinstitution of diltiazem gtt., now at 6 mg/hour. On review of telemetry, HR increases with desaturations.  Treatment is limited as she is intolerant to amiodarone. She is on digoxin 0.125 mg which was started on 09/14/2016. With significant wheezing reluctant to place on BB. Will give her digoxin 0.5 mg IV x1. Continue diltiazem gtt for how. May try to go back on po diltiazem in the am.   2. COPD exacerbation. She is being managed by Dr. Juanetta Gosling, currently on steroids, antibiotics, and bronchodilators. Still not at baseline. Steroids may be contributing to HR elevation as well.   3. Chronic hypotension, on midodrine. Stable at present on diltiazem gtt.    Signed, Joni Reining, NP  09/19/2016, 11:19 AM    The patient was seen and examined, and I agree with the history, physical exam, assessment and plan as documented above, with modifications as noted below. I have also personally reviewed all relevant documentation, old records, labs, and both radiographic and cardiovascular studies. I have also independently interpreted old and new ECG's.  Pt having paroxysmal rapid atrial fibrillation and currently on diltiazem drip 6 mg/hr. This is being driven by underlying pulmonary pathology. Will provide 0.5 mg IV digoxin so as not to precipitate hypotension. However, I think this will continue to be a problem for aforementioned reasons.  Prentice Docker, MD, [facility]  09/19/2016 1:39 PM

## 2016-09-19 NOTE — [facility] Notes (Signed)
1040 fecal occult stool positive, MD notified.

## 2016-09-19 NOTE — [facility] Notes (Signed)
1042 spoke with Deborah [name] in cardiology to confirm that patient was on the list to be seen today by cardiology.

## 2016-09-19 NOTE — Progress Notes (Signed)
Subjective: She is overall about the same. She is on nasal oxygen. She is fairly alert. She has not had any new problems or complaints overnight. No overt aspiration.  Objective: Vital signs in last 24 hours: Temp:  [97.6 F (36.4 C)-98 F (36.7 C)] 97.6 F (36.4 C) (04/30 0400) Pulse Rate:  [49-157] 90 (04/30 0600) Resp:  [10-28] 16 (04/30 0600) BP: (80-130)/(59-99) 130/73 (04/30 0600) SpO2:  [86 %-97 %] 97 % (04/30 0600) FiO2 (%):  [34 %] 34 % (04/29 2000) Weight:  [63.9 kg (140 lb 14 oz)] 63.9 kg (140 lb 14 oz) (04/30 0500) Weight change: 0 kg (0 lb) Last BM Date: 09/18/16  Intake/Output from previous day: 04/29 0701 - 04/30 0700 In: 846 [P.O.:120; I.V.:626; IV Piggyback:100] Out: 1150 [Urine:1150]  PHYSICAL EXAM General appearance: alert, cooperative, no distress and Mildly confused Resp: She still has rhonchi bilaterally but less than yesterday and substantially less than the day before Cardio: She is in atrial fib and her ventricular response is still around 100 -115 GI: soft, non-tender; bowel sounds normal; no masses,  no organomegaly Extremities: extremities normal, atraumatic, no cyanosis or edema skin warm and dry  Lab Results:  Results for orders placed or performed during the hospital encounter of 09/13/16 (from the past 48 hour(s))  CBC     Status: Abnormal   Collection Time: 09/18/16  7:39 AM  Result Value Ref Range   WBC 20.1 (H) 4.0 - 10.5 K/uL   RBC 5.36 (H) 3.87 - 5.11 MIL/uL   Hemoglobin 15.4 (H) 12.0 - 15.0 g/dL   HCT 46.6 (H) 36.0 - 46.0 %   MCV 86.9 78.0 - 100.0 fL   MCH 28.7 26.0 - 34.0 pg   MCHC 33.0 30.0 - 36.0 g/dL   RDW 14.2 11.5 - 15.5 %   Platelets 184 150 - 400 K/uL  Basic metabolic panel     Status: Abnormal   Collection Time: 09/18/16  7:39 AM  Result Value Ref Range   Sodium 133 (L) 135 - 145 mmol/L   Potassium 3.1 (L) 3.5 - 5.1 mmol/L   Chloride 87 (L) 101 - 111 mmol/L   CO2 35 (H) 22 - 32 mmol/L   Glucose, Bld 102 (H) 65 - 99  mg/dL   BUN 29 (H) 6 - 20 mg/dL   Creatinine, Ser 0.56 0.44 - 1.00 mg/dL   Calcium 8.8 (L) 8.9 - 10.3 mg/dL   GFR calc non Af Amer >60 >60 mL/min   GFR calc Af Amer >60 >60 mL/min    Comment: (NOTE) The eGFR has been calculated using the CKD EPI equation. This calculation has not been validated in all clinical situations. eGFR's persistently <60 mL/min signify possible Chronic Kidney Disease.    Anion gap 11 5 - 15  Magnesium     Status: None   Collection Time: 09/18/16  7:48 AM  Result Value Ref Range   Magnesium 2.2 1.7 - 2.4 mg/dL  CBC     Status: Abnormal   Collection Time: 09/19/16  6:28 AM  Result Value Ref Range   WBC 20.2 (H) 4.0 - 10.5 K/uL   RBC 5.40 (H) 3.87 - 5.11 MIL/uL   Hemoglobin 15.6 (H) 12.0 - 15.0 g/dL   HCT 46.9 (H) 36.0 - 46.0 %   MCV 86.9 78.0 - 100.0 fL   MCH 28.9 26.0 - 34.0 pg   MCHC 33.3 30.0 - 36.0 g/dL   RDW 14.2 11.5 - 15.5 %   Platelets 198 150 -  400 K/uL  Basic metabolic panel     Status: Abnormal   Collection Time: 09/19/16  6:28 AM  Result Value Ref Range   Sodium 132 (L) 135 - 145 mmol/L   Potassium 3.4 (L) 3.5 - 5.1 mmol/L   Chloride 89 (L) 101 - 111 mmol/L   CO2 33 (H) 22 - 32 mmol/L   Glucose, Bld 115 (H) 65 - 99 mg/dL   BUN 27 (H) 6 - 20 mg/dL   Creatinine, Ser 0.56 0.44 - 1.00 mg/dL   Calcium 8.8 (L) 8.9 - 10.3 mg/dL   GFR calc non Af Amer >60 >60 mL/min   GFR calc Af Amer >60 >60 mL/min    Comment: (NOTE) The eGFR has been calculated using the CKD EPI equation. This calculation has not been validated in all clinical situations. eGFR's persistently <60 mL/min signify possible Chronic Kidney Disease.    Anion gap 10 5 - 15    ABGS  Recent Labs  09/16/16 1825  PHART 7.441  PO2ART 52.9*  HCO3 33.0*   CULTURES Recent Results (from the past 240 hour(s))  MRSA PCR Screening     Status: None   Collection Time: 09/13/16  6:15 AM  Result Value Ref Range Status   MRSA by PCR NEGATIVE NEGATIVE Final    Comment:        The  GeneXpert MRSA Assay (FDA approved for NASAL specimens only), is one component of a comprehensive MRSA colonization surveillance program. It is not intended to diagnose MRSA infection nor to guide or monitor treatment for MRSA infections.    Studies/Results: Dg Chest Port 1 View  Result Date: 09/17/2016 CLINICAL DATA:  Hypoxemia EXAM: PORTABLE CHEST 1 VIEW COMPARISON:  09/16/2016 FINDINGS: The lungs are hyperinflated likely secondary to COPD. There is no focal parenchymal opacity. There is no pleural effusion or pneumothorax. There is stable cardiomegaly. The osseous structures are unremarkable. IMPRESSION: 1. COPD. 2. No acute cardiopulmonary disease. Electronically Signed   By: Kathreen Devoid   On: 09/17/2016 09:46    Medications:  Prior to Admission:  Prescriptions Prior to Admission  Medication Sig Dispense Refill Last Dose  . acetaminophen (TYLENOL) 500 MG tablet Take 500 mg by mouth every 6 (six) hours as needed for mild pain. For pain    09/11/2016  . albuterol (PROVENTIL) (2.5 MG/3ML) 0.083% nebulizer solution Take 2.5 mg by nebulization 2 (two) times daily as needed.   09/12/2016 at Unknown time  . alendronate (FOSAMAX) 70 MG tablet Take 70 mg by mouth once a week. Take with a full glass of water on an empty stomach.   09/07/2016  . aspirin (ASPIRIN EC) 81 MG EC tablet Take 1 tablet (81 mg total) by mouth daily. Swallow whole.   09/12/2016 at Unknown time  . atorvastatin (LIPITOR) 40 MG tablet Take 40 mg by mouth. Monday, Wednesday, & Friday evening.   09/12/2016 at Unknown time  . Calcium Carbonate-Vit D-Min (CALTRATE 600+D PLUS) 600-400 MG-UNIT per tablet Take 1 tablet by mouth 2 (two) times daily.     09/12/2016 at Unknown time  . digoxin (LANOXIN) 0.25 MG tablet Take 0.025 mg by mouth every evening.   09/12/2016 at Unknown time  . diltiazem (CARDIZEM) 30 MG tablet Take 15 mg by mouth 2 (two) times daily.   09/12/2016 at Unknown time  . docusate sodium (COLACE) 100 MG capsule Take 100  mg by mouth at bedtime.   09/11/2016 at Unknown time  . furosemide (LASIX) 40 MG tablet Take 10  mg by mouth 2 (two) times daily.    09/12/2016 at Unknown time  . levETIRAcetam (KEPPRA) 250 MG tablet Takes one tablet in the morning and half a tablet at 3 pm.   09/12/2016 at Unknown time  . levothyroxine (SYNTHROID, LEVOTHROID) 25 MCG tablet Take 25 mcg by mouth daily before breakfast.   09/12/2016 at Unknown time  . midodrine (PROAMATINE) 2.5 MG tablet Take 1 tablet (2.5 mg total) by mouth 2 (two) times daily with a meal. The dosing can be titrated down to once daily when her systolic blood pressure is consistently in the 100s.   09/12/2016 at Unknown time  . Multiple Vitamins-Minerals (ICAPS) CAPS Take 1 capsule by mouth 2 (two) times daily.   09/12/2016 at Unknown time  . nitroGLYCERIN (NITROSTAT) 0.4 MG SL tablet Place 0.4 mg under the tongue every 5 (five) minutes as needed for chest pain.   unknown  . omeprazole (PRILOSEC OTC) 20 MG tablet Take 20 mg by mouth. Monday, Wednesday, & Friday evening.   09/11/2016  . potassium chloride (KLOR-CON) 10 MEQ CR tablet Take 10 mEq by mouth daily.     09/12/2016 at Unknown time  . predniSONE (DELTASONE) 10 MG tablet Take 4 tablets (40 mg total) by mouth daily. 20 tablet 0 09/11/2016  . Probiotic Product (PROBIOTIC DAILY PO) Take 1 tablet by mouth every morning.   09/11/2016   Scheduled: . acetaminophen  650 mg Oral TID  . aspirin EC  81 mg Oral Daily  . atorvastatin  40 mg Oral q1800  . calcium-vitamin D  1 tablet Oral BID  . digoxin  0.125 mg Oral QPM  . docusate sodium  100 mg Oral QHS  . enoxaparin (LOVENOX) injection  40 mg Subcutaneous Q24H  . fluticasone  2 spray Each Nare Daily  . guaiFENesin  1,200 mg Oral BID  . ipratropium  0.5 mg Nebulization TID  . levalbuterol  0.63 mg Nebulization TID  . levETIRAcetam  125 mg Oral Q1500  . levETIRAcetam  250 mg Oral q morning - 10a  . levothyroxine  25 mcg Oral QAC breakfast  . loratadine  10 mg Oral Daily  .  methylPREDNISolone (SOLU-MEDROL) injection  60 mg Intravenous Q6H  . midodrine  2.5 mg Oral BID WC  . multivitamin-lutein  1 capsule Oral BID  . senna  1 tablet Oral Daily   Continuous: . sodium chloride 20 mL/hr at 09/13/16 0648  . sodium chloride 10 mL/hr at 09/19/16 0600  . diltiazem (CARDIZEM) infusion 6 mg/hr (09/19/16 0600)  . piperacillin-tazobactam (ZOSYN)  IV 3.375 g (09/19/16 4128)   NOM:[id number], guaiFENesin-dextromethorphan, levalbuterol, morphine injection, nitroGLYCERIN, ondansetron (ZOFRAN) IV, sodium chloride  Assesment: She was admitted was COPD with exacerbation with a significant asthmatic component. She also had atrial fibrillation rapid ventricular response. At baseline she has diastolic heart failure and that was treated with excellent diuresis but it didn't seem to make much difference in her respiratory status. She had an episode of choking and I think her problem is likely that she is aspirating related to her Alzheimer's disease. Her daughter is wanting to lean more toward palliative care and avoid "aggressive treatments" Principal Problem:   COPD with acute exacerbation ([place]) Active Problems:   Mixed hyperlipidemia   DIASTOLIC HEART FAILURE, CHRONIC   Hypotension   Alzheimer disease   Seizures ([facility])   Hypokalemia   Atrial fibrillation with RVR ([facility])   COPD exacerbation ([facility])    Plan: For speech evaluation today. She is nothing by  mouth for now.    LOS: 6 days   Malaney Mcbean L 09/19/2016, 7:27 AM

## 2016-09-20 LAB — BASIC METABOLIC PANEL
Anion gap: 9 (ref 5–15)
BUN: 25 mg/dL — AB (ref 6–20)
CALCIUM: 8.1 mg/dL — AB (ref 8.9–10.3)
CO2: 33 mmol/L — ABNORMAL HIGH (ref 22–32)
Chloride: 91 mmol/L — ABNORMAL LOW (ref 101–111)
Creatinine, Ser: 0.51 mg/dL (ref 0.44–1.00)
GFR calc Af Amer: >60 mL/min (ref >60)
GLUCOSE: 139 mg/dL — AB (ref 65–99)
Potassium: 3.1 mmol/L — ABNORMAL LOW (ref 3.5–5.1)
Sodium: 133 mmol/L — ABNORMAL LOW (ref 135–145)

## 2016-09-20 LAB — CBC
HCT: 42.4 % (ref 36.0–46.0)
Hemoglobin: 14.2 g/dL (ref 12.0–15.0)
MCH: 28.8 pg (ref 26.0–34.0)
MCHC: 33.5 g/dL (ref 30.0–36.0)
MCV: 86 fL (ref 78.0–100.0)
PLATELETS: 179 10*3/uL (ref 150–400)
RBC: 4.93 MIL/uL (ref 3.87–5.11)
RDW: 14.1 % (ref 11.5–15.5)
WBC: 18.4 10*3/uL — ABNORMAL HIGH (ref 4.0–10.5)

## 2016-09-20 MED ORDER — METHYLPREDNISOLONE SODIUM SUCC 125 MG IJ SOLR
60.0000 mg | Freq: Two times a day (BID) | INTRAMUSCULAR | Status: DC
  Administered 2016-09-20 – 2016-09-21 (×2): 60 mg via INTRAVENOUS
  Filled 2016-09-20 (×2): qty 2

## 2016-09-20 MED ORDER — POTASSIUM CHLORIDE CRYS ER 20 MEQ PO TBCR
40.0000 meq | EXTENDED_RELEASE_TABLET | ORAL | Status: AC
  Administered 2016-09-20 (×2): 40 meq via ORAL
  Filled 2016-09-20 (×2): qty 2

## 2016-09-20 MED ORDER — DILTIAZEM HCL 30 MG PO TABS
30.0000 mg | ORAL_TABLET | Freq: Four times a day (QID) | ORAL | Status: DC
  Administered 2016-09-20 – 2016-09-21 (×5): 30 mg via ORAL
  Filled 2016-09-20 (×5): qty 1

## 2016-09-20 NOTE — Progress Notes (Signed)
  Speech Language Pathology Treatment: Dysphagia  Patient Details Name: Deborah [name] MRN: [id number] DOB: 1926/02/17 Today's Date: 09/20/2016 Time: 1112-1120 SLP Time Calculation (min) (ACUTE ONLY): 8 min  Assessment / Plan / Recommendation Clinical Impression  Pt seen at bedside for follow up from clinical swallow evaluation yesterday. RN reports that Pt consumed evening and breakfast meal with set up assistance. Pt voiced "feeling much better" today and stated that she was "very satisfied" with breakfast. Pt agreeable to sips of thin water via straw and showed no signs of distress or coughing. She stated that the solid texture (D2/chopped) was appropriate. Continue diet as ordered and SLP will follow peripherally as needed. Above to RN.   HPI HPI: Deborah [name] is an 83 yo female who typically resides at [facility] a history of chronic atrial fibrillation (not anticoagulated due to fall risk and prior history of hemorrhage), chronic hypotension requiring midodrine, previous intolerance to amiodarone and flecainide, also symptomatically stable CAD. She is currently admitted to the hospital with respiratory failure in the setting of COPD exacerbation. Atrial fibrillation heart rate has been elevated in this setting. Pt reportedly may have aspirated on Friday when taking medications. Chest x-ray from Saturday shows: The lungs are hyperinflated likely secondary to COPD. There is no focal parenchymal opacity. There is no pleural effusion or pneumothorax. There is stable cardiomegaly. MD ordered BSE. Pt has been NPO except for sips of water since Saturday. Her daughter reports poor po intake for over a week.      SLP Plan  Continue with current plan of care       Recommendations  Diet recommendations: Dysphagia 2 (fine chop);Thin liquid Liquids provided via: Cup;Straw Medication Administration: Whole meds with puree Supervision: Patient able to self feed;Full supervision/cueing for compensatory  strategies Compensations: Slow rate;Small sips/bites Postural Changes and/or Swallow Maneuvers: Seated upright 90 degrees;Upright 30-60 min after meal                Oral Care Recommendations: Oral care BID;Staff/trained caregiver to provide oral care Follow up Recommendations: 24 hour supervision/assistance SLP Visit Diagnosis: Dysphagia, oropharyngeal phase (R13.12) Plan: Continue with current plan of care       Thank you,  Havery Moros, CCC-SLP [phone]                 PORTER,DABNEY 09/20/2016, 11:28 AM

## 2016-09-20 NOTE — Progress Notes (Signed)
Progress Note  Patient Name: Deborah [name] Date of Encounter: 09/20/2016  Primary Cardiologist: Diona Browner  Subjective   Feeling much better. Good appetite. Feeling grateful. Has questions about her cough.  Inpatient Medications    Scheduled Meds: . acetaminophen  650 mg Oral TID  . atorvastatin  40 mg Oral q1800  . calcium-vitamin D  1 tablet Oral BID  . chlorhexidine  15 mL Mouth Rinse BID  . digoxin  0.125 mg Oral QPM  . diltiazem  30 mg Oral Q6H  . feeding supplement (ENSURE ENLIVE)  237 mL Oral BID BM  . fluticasone  2 spray Each Nare Daily  . guaiFENesin  1,200 mg Oral BID  . ipratropium  0.5 mg Nebulization TID  . levalbuterol  0.63 mg Nebulization TID  . levETIRAcetam  125 mg Oral Q1500  . levETIRAcetam  250 mg Oral q morning - 10a  . levothyroxine  25 mcg Oral QAC breakfast  . loratadine  10 mg Oral Daily  . mouth rinse  15 mL Mouth Rinse q12n4p  . methylPREDNISolone (SOLU-MEDROL) injection  60 mg Intravenous Q12H  . midodrine  2.5 mg Oral BID WC  . multivitamin-lutein  1 capsule Oral BID  . pantoprazole (PROTONIX) IV  40 mg Intravenous Q12H  . potassium chloride  40 mEq Oral Q4H   Continuous Infusions: . sodium chloride 20 mL/hr at 09/13/16 0648  . sodium chloride 10 mL/hr at 09/19/16 0600  . diltiazem (CARDIZEM) infusion 6 mg/hr (09/20/16 0101)  . piperacillin-tazobactam (ZOSYN)  IV 3.375 g (09/20/16 0530)   PRN Meds: acetaminophen, guaiFENesin-dextromethorphan, levalbuterol, morphine injection, nitroGLYCERIN, ondansetron (ZOFRAN) IV, sodium chloride   Vital Signs    Vitals:   09/20/16 0800 09/20/16 0842 09/20/16 0900 09/20/16 0909  BP:  116/77    Pulse: 91  94   Resp: 19  19   Temp:      TempSrc:      SpO2: 95%  92% 92%  Weight:      Height:        Intake/Output Summary (Last 24 hours) at 09/20/16 0942 Last data filed at 09/20/16 0800  Gross per 24 hour  Intake             1901 ml  Output             1350 ml  Net              551 ml    Filed Weights   09/18/16 0500 09/19/16 0500 09/20/16 0500  Weight: 140 lb 14 oz (63.9 kg) 140 lb 14 oz (63.9 kg) 141 lb 5 oz (64.1 kg)    Telemetry    Atrial fib - Personally Reviewed  ECG    None today  Physical Exam   GEN: No acute distress.   Neck: No JVD Cardiac: Irregular rhythm. Normal S1S2, no murmurs  Respiratory: Mild wheezes and rhonchi b/l GI: Soft, nontender, non-distended  MS: No edema; No deformity. Neuro:  Nonfocal  Psych: Normal affect   Labs    Chemistry Recent Labs Lab 09/18/16 0739 09/19/16 0628 09/20/16 0520  NA 133* 132* 133*  K 3.1* 3.4* 3.1*  CL 87* 89* 91*  CO2 35* 33* 33*  GLUCOSE 102* 115* 139*  BUN 29* 27* 25*  CREATININE 0.56 0.56 0.51  CALCIUM 8.8* 8.8* 8.1*  GFRNONAA >60 >60 >60  GFRAA >60 >60 >60  ANIONGAP Hematology Recent Labs Lab 09/18/16 0739 09/19/16 0628 09/20/16  0520  WBC 20.1* 20.2* 18.4*  RBC 5.36* 5.40* 4.93  HGB 15.4* 15.6* 14.2  HCT 46.6* 46.9* 42.4  MCV 86.9 86.9 86.0  MCH 28.7 28.9 28.8  MCHC 33.0 33.3 33.5  RDW 14.2 14.2 14.1  PLT 184 198 179    Cardiac Enzymes Recent Labs Lab 09/13/16 1506  TROPONINI <0.03   No results for input(s): TROPIPOC in the last 168 hours.   BNP Recent Labs Lab 09/16/16 1924  BNP 217.0*     DDimer No results for input(s): DDIMER in the last 168 hours.   Radiology     09/17/16  FINDINGS: The lungs are hyperinflated likely secondary to COPD. There is no focal parenchymal opacity. There is no pleural effusion or pneumothorax. There is stable cardiomegaly.  The osseous structures are unremarkable.  IMPRESSION: 1. COPD. 2. No acute cardiopulmonary disease.  Cardiac Studies   None.  Patient Profile     83 y.o. female with a history of chronic atrial fibrillation (not anticoagulated due to fall risk and prior history of hemorrhage), chronic hypotension requiring midodrine, previous intolerance to amiodarone and flecainide, also  symptomatically stable CAD. She is currently admitted to the hospital with respiratory failure in the setting of COPD exacerbation. Atrial fibrillation heart rate has been elevated in this setting.  Assessment & Plan    1. Chronic atrial fibrillation with recent RVR in the setting of COPD exacerbation.  HR's better controlled today. Clinically she has improved. On diltiazem 6 mg/hr and digoinx 0.125 mg daily. Not an anticoagulation candidate. Intolerant of amiodarone and flecainide per documentation. I will try oral diltiazem 30 mg q 6 hours. Being driven by underlying pulmonary process.  2. COPD exacerbation. Improving on IV steroids, antibiotics (Zosyn), and bronchodilators. This is driving rapid atrial fibrillation.  3. Chronic hypotension, on midodrine. BP normal.   Signed, Prentice Docker, MD  09/20/2016, 9:42 AM

## 2016-09-20 NOTE — Progress Notes (Signed)
PROGRESS NOTE    Deborah [name]  ZOX:[id number] DOB: Oct 04, 1925 DOA: 09/13/2016 PCP: Ignatius Specking, MD    Brief Narrative:  Deborah [name] is a 83 y.o. female with medical history significant of Alzheimer's disease, asthma, atrial fibrillation, chronic diastolic heart failure, COPD, CAD, hypertension, mixed hyperlipidemia, mild history of seizures who is returning to the ER due to shortness of breath.  Per patient and her daughter, on Thursday, the patient got exposed to environmental allergens during the day subsequently developing wheezing and dyspnea. She used her MDI and nebulizer without significant relief. She had to come to the emergency department on Saturday for treatment. She was discharged home with oral prednisone. Symptoms improved but subsequently got worse and presented again to ED.   Patient admitted with COPD exacerbation secondary to environmental factors, allergy. Her hospital course was complicated by A fib RVR. She develops worsening hypoxic , hypercapnic respiratory failure during hospitalization. She received IV lasix, started on IV antibiotics and was placed on BIPAP. Worsening respiratory failure was thought to be related to aspiration vs HF, vs COPD. Patient has improved from respiratory status. She then develops Diarrhea, melena 4-30. Aspirin and Lovenox (dose of DVT prophylaxis) was discontinue. She was started on IV Protonix. Patient has been having problems with confusion, delirium.    Assessment & Plan:   Principal Problem:   COPD with acute exacerbation (HCC) Active Problems:   Mixed hyperlipidemia   DIASTOLIC HEART FAILURE, CHRONIC   Hypotension   Alzheimer disease   Seizures (HCC)   Hypokalemia   Atrial fibrillation with RVR (HCC)   COPD exacerbation (HCC)   Hypoxemia  1-Acute COPD exacerbation; Acute hypoxic respiratory failure.  Thought initially to be Secondary to environmental factors. Worsening hypoxemia 4-27. treated with IV lasix, IV zosyn. Concern  with episode of aspiration.  -no significant sign of aspiration on speech evaluation.  Continue with Xopenex, Ipratropium.  On IV solumedrol , change today to BID, could transition to oral in am.  Holding lasix due to dehydration.  Continue with IV zosyn.  Appreciate Dr Juanetta Gosling help.  Off BIPAP. Oxygen requirement decreased to 3 L.  Improving.   2-A fib RVR Continue with digoxin and Cardizem  Cardiology following.  Will change cardizem Gtt to oral 30 mg every 6 hours.   3-Melena, Diarrhea, Black stool. Occult blood positive.  Will discontinue laxatives. Discontinue aspirin and Lovenox.  Continue with IV Protonix. Follow hb.  Discussed with daughter, would not proceed with GI evaluation like endoscopy. supor care.  Repeat labs in am.   4-Acute on ? Chronic Diastolic HF  Hold lasix., due to increase BUN, hypochloremia Resume lasix when diarrhea resolved and renal function stable.   5-hyperlipidemia; Continue with Lipitor.   6-Chronic hypotension;  Continue with midodrine.   7-Alzheimer;  At risk for delirium.   8-Seizure;  Continue with Keppra.   9-Hypothyroidism;  Continue with synthroid.   Acute encephalopathy; related to medication, ativan, hospital delirium and hypoxemia Discontinue ativan.  Haldol PRN   Leukocytosis;  Suspect related to steroids. Trending down.  X ray negative, Continue with IV zosyn.   Hypokalemia; replete IV   DVT prophylaxis: lovenox Code Status: DNR Family Communication: daughter  Disposition Plan:  If continue to improved suspect would be able to go back to ALF when hb stable and no further melena. Suspect discharge in 48 hours. Transfer to telemetry 5-02 if HR stable.    Consultants:   Cardiology  Pulmonology    Procedures: none  Antimicrobials:   Doxycycline 4-25 ----4-28.    Subjective: She is alert this morning, eating breakfast. Had multiple black stool last night. None this morning. She denies abdominal pain.    She is breathing better, still coughing.  She is requiring less amount of oxygen.     Objective: Vitals:   09/20/16 0430 09/20/16 0445 09/20/16 0500 09/20/16 0515  BP: 111/73  114/72   Pulse: 82 80 69 68  Resp: 20 19 (!) 22 18  Temp:      TempSrc:      SpO2: 95% 97% 98% 99%  Weight:   64.1 kg (141 lb 5 oz)   Height:        Intake/Output Summary (Last 24 hours) at 09/20/16 0834 Last data filed at 09/20/16 0530  Gross per 24 hour  Intake             1421 ml  Output             1350 ml  Net               71 ml   Filed Weights   09/18/16 0500 09/19/16 0500 09/20/16 0500  Weight: 63.9 kg (140 lb 14 oz) 63.9 kg (140 lb 14 oz) 64.1 kg (141 lb 5 oz)    Examination:  General exam ; NAD.   Respiratory system: no wheezing,  Mild Use of  accessory muscle.  Cardiovascular system: I RR S1 & S2 heard. No edema Gastrointestinal system: soft, nt  Central nervous system: Alert and confuse. Moves all 4 extremities.  Extremities: moves extremities.  Skin: No rashes, lesions or ulcers     Data Reviewed: I have personally reviewed following labs and imaging studies  CBC:  Recent Labs Lab 09/16/16 0747 09/17/16 0549 09/18/16 0739 09/19/16 0628 09/20/16 0520  WBC 25.5* 24.3* 20.1* 20.2* 18.4*  HGB 15.4* 15.3* 15.4* 15.6* 14.2  HCT 46.6* 46.0 46.6* 46.9* 42.4  MCV 88.8 88.0 86.9 86.9 86.0  PLT 192 196 184 198 179   Basic Metabolic Panel:  Recent Labs Lab 09/16/16 0422 09/17/16 0549 09/18/16 0739 09/18/16 0748 09/19/16 0628 09/20/16 0520  NA 134* 132* 133*  --  132* 133*  K 4.1 3.6 3.1*  --  3.4* 3.1*  CL 95* 87* 87*  --  89* 91*  CO2 29 36* 35*  --  33* 33*  GLUCOSE 137* 132* 102*  --  115* 139*  BUN 18 22* 29*  --  27* 25*  CREATININE 0.43* 0.59 0.56  --  0.56 0.51  CALCIUM 9.0 8.8* 8.8*  --  8.8* 8.1*  MG  --   --   --  2.2  --   --    GFR: Estimated Creatinine Clearance: 43.7 mL/min (by C-G formula based on SCr of 0.51 mg/dL). Liver Function Tests: No  results for input(s): AST, ALT, ALKPHOS, BILITOT, PROT, ALBUMIN in the last 168 hours. No results for input(s): LIPASE, AMYLASE in the last 168 hours. No results for input(s): AMMONIA in the last 168 hours. Coagulation Profile: No results for input(s): INR, PROTIME in the last 168 hours. Cardiac Enzymes:  Recent Labs Lab 09/13/16 0851 09/13/16 1506  TROPONINI <0.03 <0.03   BNP (last 3 results) No results for input(s): PROBNP in the last 8760 hours. HbA1C: No results for input(s): HGBA1C in the last 72 hours. CBG:  Recent Labs Lab 09/19/16 1139  GLUCAP 119*   Lipid Profile: No results for input(s): CHOL, HDL, LDLCALC, TRIG, CHOLHDL, LDLDIRECT  in the last 72 hours. Thyroid Function Tests: No results for input(s): TSH, T4TOTAL, FREET4, T3FREE, THYROIDAB in the last 72 hours. Anemia Panel: No results for input(s): VITAMINB12, FOLATE, FERRITIN, TIBC, IRON, RETICCTPCT in the last 72 hours. Sepsis Labs: No results for input(s): PROCALCITON, LATICACIDVEN in the last 168 hours.  Recent Results (from the past 240 hour(s))  MRSA PCR Screening     Status: None   Collection Time: 09/13/16  6:15 AM  Result Value Ref Range Status   MRSA by PCR NEGATIVE NEGATIVE Final    Comment:        The GeneXpert MRSA Assay (FDA approved for NASAL specimens only), is one component of a comprehensive MRSA colonization surveillance program. It is not intended to diagnose MRSA infection nor to guide or monitor treatment for MRSA infections.          Radiology Studies: No results found.      Scheduled Meds: . acetaminophen  650 mg Oral TID  . atorvastatin  40 mg Oral q1800  . calcium-vitamin D  1 tablet Oral BID  . chlorhexidine  15 mL Mouth Rinse BID  . digoxin  0.125 mg Oral QPM  . diltiazem  30 mg Oral Q6H  . feeding supplement (ENSURE ENLIVE)  237 mL Oral BID BM  . fluticasone  2 spray Each Nare Daily  . guaiFENesin  1,200 mg Oral BID  . ipratropium  0.5 mg Nebulization TID   . levalbuterol  0.63 mg Nebulization TID  . levETIRAcetam  125 mg Oral Q1500  . levETIRAcetam  250 mg Oral q morning - 10a  . levothyroxine  25 mcg Oral QAC breakfast  . loratadine  10 mg Oral Daily  . mouth rinse  15 mL Mouth Rinse q12n4p  . methylPREDNISolone (SOLU-MEDROL) injection  60 mg Intravenous Q12H  . midodrine  2.5 mg Oral BID WC  . multivitamin-lutein  1 capsule Oral BID  . pantoprazole (PROTONIX) IV  40 mg Intravenous Q12H  . potassium chloride  40 mEq Oral Q4H   Continuous Infusions: . sodium chloride 20 mL/hr at 09/13/16 0648  . sodium chloride 10 mL/hr at 09/19/16 0600  . diltiazem (CARDIZEM) infusion 6 mg/hr (09/20/16 0101)  . piperacillin-tazobactam (ZOSYN)  IV 3.375 g (09/20/16 0530)     LOS: 7 days    Time spent: 35 minutes.     Alba Cory, MD [facility] Pager [phone]  If 7PM-7AM, please contact night-coverage [email or web address] Password [facility] 09/20/2016, 8:34 AM

## 2016-09-20 NOTE — Progress Notes (Signed)
Patient consumed 75% of her brkfst, got up to the bedside commode & had 1 large green, loose stool. Patient now OOB up in chair visiting with her daughter.

## 2016-09-20 NOTE — Evaluation (Signed)
Physical Therapy Evaluation Patient Details Name: Deborah [name] MRN: [id number] DOB: 1925-08-08 Today's Date: 09/20/2016   History of Present Illness  83 y.o. female with medical history significant of Alzheimer's disease, asthma, atrial fibrillation, chronic diastolic heart failure, COPD, CAD, hypertension, mixed hyperlipidemia, mild history of seizures who is returning to the ER due to shortness of breath.  In the ED, EKG show atrial fibrillation with RVR.  Dx: Chronic atrial fibrillation with recent RVR in the setting of COPD exacerbation  Clinical Impression  Pt received in bed, dtr present, and pt is agreeable to PT evaluation.  Pt lives at [place], but dtr states that she walks a mile a day with her cane, and she is normally independent with ADL's.  During PT evaluation, pt is able to complete bed mobility at supervision level, and she ambulated 77ft with RW and min guard.  She is recommended to return to [facility] with HHPT, and a RW to improve strength, balance, and endurance to return to PLOF.     Follow Up Recommendations Home health PT;Other (comment) (Return to [facility])    Engineer, agricultural with 5" wheels    Recommendations for Other Services       Precautions / Restrictions Precautions Precautions: Fall Precaution Comments: due to immobiltiy.  Restrictions Weight Bearing Restrictions: No      Mobility  Bed Mobility Overal bed mobility: Needs Assistance Bed Mobility: Supine to Sit;Sit to Supine;Rolling Rolling: Modified independent (Device/Increase time)   Supine to sit: Supervision Sit to supine: Supervision   General bed mobility comments: After ambulation in the hallway, pt return to her bed due to being fatigued.  Upon repositioning her self, she was incontinent of her bowels.  Pt placed on a bed pan at end of tx & RN notified.   Transfers Overall transfer level: Needs assistance Equipment used: Rolling walker (2 wheeled) Transfers:  Sit to/from Stand Sit to Stand: Min guard            Ambulation/Gait Ambulation/Gait assistance: Min guard Ambulation Distance (Feet): 50 Feet Assistive device: Rolling walker (2 wheeled) Gait Pattern/deviations: Trunk flexed;Step-through pattern     General Gait Details: decreased cadence with assistance required for RW navigation - pt has macular degeneration, and does not see well.  Pt was able to stand and talk to her friend who is in the room down the hallway for a few minutes and then return to her room.    Stairs            Wheelchair Mobility    Modified Rankin (Stroke Patients Only)       Balance Overall balance assessment: Needs assistance Sitting-balance support: Feet supported;Bilateral upper extremity supported Sitting balance-Leahy Scale: Good     Standing balance support: Bilateral upper extremity supported Standing balance-Leahy Scale: Fair                               Pertinent Vitals/Pain Pain Assessment: No/denies pain    Home Living       Type of Home: Assisted living [facility])         Home Equipment: Gilmer Mor - single point      Prior Function Level of Independence: Independent with assistive device(s)   Gait / Transfers Assistance Needed: Dtr states that she walks over a mile per day at [facility] with her cane.    ADL's / Homemaking Assistance Needed: independent - sponge bath every day, and 2x's/wk  she has a full shower with supervision.         Hand Dominance   Dominant Hand: Right    Extremity/Trunk Assessment   Upper Extremity Assessment Upper Extremity Assessment: Overall WFL for tasks assessed    Lower Extremity Assessment Lower Extremity Assessment: Generalized weakness       Communication   Communication: No difficulties  Cognition Arousal/Alertness: Awake/alert Behavior During Therapy: WFL for tasks assessed/performed Overall Cognitive Status: Impaired/Different from baseline Area of  Impairment: Orientation                 Orientation Level: Situation;Disoriented to;Time                    General Comments      Exercises     Assessment/Plan    PT Assessment Patient needs continued PT services  PT Problem List Decreased strength;Decreased activity tolerance;Decreased balance;Decreased mobility;Decreased knowledge of use of DME;Decreased safety awareness;Cardiopulmonary status limiting activity       PT Treatment Interventions DME instruction;Gait training;Functional mobility training;Therapeutic activities;Therapeutic exercise;Balance training;Patient/family education    PT Goals (Current goals can be found in the Care Plan section)  Acute Rehab PT Goals Patient Stated Goal: to go back to [place].  PT Goal Formulation: With patient/family Time For Goal Achievement: 10/04/16 Potential to Achieve Goals: Good    Frequency Min 3X/week   Barriers to discharge        Co-evaluation               AM-PAC PT "6 Clicks" Daily Activity  Outcome Measure Difficulty turning over in bed (including adjusting bedclothes, sheets and blankets)?: None Difficulty moving from lying on back to sitting on the side of the bed? : None Difficulty sitting down on and standing up from a chair with arms (e.g., wheelchair, bedside commode, etc,.)?: A Little Help needed moving to and from a bed to chair (including a wheelchair)?: A Little Help needed walking in hospital room?: A Little Help needed climbing 3-5 steps with a railing? : A Little 6 Click Score: 20    End of Session Equipment Utilized During Treatment: Gait belt Activity Tolerance: Patient tolerated treatment well Patient left: in bed;with call bell/phone within reach;with family/visitor present Nurse Communication: Mobility status (Crystal, RN notified, and mobiltiy sheet left in pt's room. ) PT Visit Diagnosis: Muscle weakness (generalized) (M62.81);Other abnormalities of gait and mobility  (R26.89)    Time: 4098-1191 PT Time Calculation (min) (ACUTE ONLY): 36 min   Charges:   PT Evaluation $PT Eval Low Complexity: 1 Procedure PT Treatments $Gait Training: 8-22 mins   PT G Codes:   PT G-Codes **NOT FOR INPATIENT CLASS** Functional Assessment Tool Used: AM-PAC 6 Clicks Basic Mobility;Clinical judgement Functional Limitation: Mobility: Walking and moving around Mobility: Walking and Moving Around Current Status (Y7829): At least 20 percent but less than 40 percent impaired, limited or restricted Mobility: Walking and Moving Around Goal Status [phone]): At least 1 percent but less than 20 percent impaired, limited or restricted    Carollee Herter, PT, DPT X: [phone] '

## 2016-09-20 NOTE — Care Management Note (Signed)
Case Management Note  Patient Details  Name: Deborah [name] MRN: [id number] Date of Birth: 05-Oct-1925  If discussed at Long Length of Stay Meetings, dates discussed: 09/20/2016 Additional Comments:  [name], [name], RN 09/20/2016, 10:52 AM

## 2016-09-20 NOTE — Progress Notes (Signed)
PO Cardizem given at 0842 this morning, Cardizem gtt stopped at 0950 this morning as ordered. Heart rate 87 at this time.

## 2016-09-20 NOTE — Progress Notes (Signed)
ANTIBIOTIC CONSULT NOTE  Pharmacy Consult for zosyn Indication: pneumonia  Allergies  Allergen Reactions  . Ivp Dye [Iodinated Diagnostic Agents] Shortness Of Breath    ??  . Omnipaque [Iohexol] Shortness Of Breath and Other (See Comments)    Short of breath with chest tightness after IV injection in CT, pt was fine when she left department but developed symptoms in parking lot and went to emergency department.  . Sulfa Antibiotics Shortness Of Breath  . Carbamazepine     REACTION: toxemia  . Dilaudid [Hydromorphone] Nausea And Vomiting  . Tramadol Other (See Comments)    confusion   Patient Measurements: Height:  (165.1 cm) Weight: 141 lb 5 oz (64.1 kg) IBW/kg (Calculated) : 57 Adjusted Body Weight:   Vital Signs: Temp: 97.9 F (36.6 C) (05/01 0400) Temp Source: Oral (05/01 0400) BP: 116/77 (05/01 0842) Pulse Rate: 79 (05/01 1100)  Labs:  Recent Labs  09/18/16 0739 09/19/16 0628 09/20/16 0520  WBC 20.1* 20.2* 18.4*  HGB 15.4* 15.6* 14.2  PLT 184 198 179  CREATININE 0.56 0.56 0.51    Estimated Creatinine Clearance: 43.7 mL/min (by C-G formula based on SCr of 0.51 mg/dL).  No results for input(s): VANCOTROUGH, VANCOPEAK, VANCORANDOM, GENTTROUGH, GENTPEAK, GENTRANDOM, TOBRATROUGH, TOBRAPEAK, TOBRARND, AMIKACINPEAK, AMIKACINTROU, AMIKACIN in the last 72 hours.   Microbiology: Recent Results (from the past 720 hour(s))  MRSA PCR Screening     Status: None   Collection Time: 09/13/16  6:15 AM  Result Value Ref Range Status   MRSA by PCR NEGATIVE NEGATIVE Final    Comment:        The GeneXpert MRSA Assay (FDA approved for NASAL specimens only), is one component of a comprehensive MRSA colonization surveillance program. It is not intended to diagnose MRSA infection nor to guide or monitor treatment for MRSA infections.     Medical History: Past Medical History:  Diagnosis Date  . Alzheimer disease   . Asthma   . Atrial fibrillation (HCC)    Permanent  . Chronic anticoagulation    Xarelto discontinued after fall  . Chronic diastolic heart failure (HCC)   . COPD (chronic obstructive pulmonary disease) (HCC)   . Coronary atherosclerosis of native coronary artery    BMS circumflex 2009  . Drug intolerance    Flecacinide and Amiodarone   . Fracture of multiple pubic rami (HCC) 12/13   Left superior and inferior - following fall  . Hypotension   . Mixed hyperlipidemia   . Retroperitoneal hemorrhage 12/13   Following fall  . Seizures (HCC)    Remote history of over 20 years ago  . Umbilical hernia     Medications:  Scheduled:  . acetaminophen  650 mg Oral TID  . atorvastatin  40 mg Oral q1800  . calcium-vitamin D  1 tablet Oral BID  . chlorhexidine  15 mL Mouth Rinse BID  . digoxin  0.125 mg Oral QPM  . diltiazem  30 mg Oral Q6H  . feeding supplement (ENSURE ENLIVE)  237 mL Oral BID BM  . fluticasone  2 spray Each Nare Daily  . guaiFENesin  1,200 mg Oral BID  . ipratropium  0.5 mg Nebulization TID  . levalbuterol  0.63 mg Nebulization TID  . levETIRAcetam  125 mg Oral Q1500  . levETIRAcetam  250 mg Oral q morning - 10a  . levothyroxine  25 mcg Oral QAC breakfast  . loratadine  10 mg Oral Daily  . mouth rinse  15 mL Mouth Rinse q12n4p  .  methylPREDNISolone (SOLU-MEDROL) injection  60 mg Intravenous Q12H  . midodrine  2.5 mg Oral BID WC  . multivitamin-lutein  1 capsule Oral BID  . pantoprazole (PROTONIX) IV  40 mg Intravenous Q12H   Infusions:  . sodium chloride 20 mL/hr at 09/13/16 0648  . sodium chloride 10 mL/hr at 09/19/16 0600  . piperacillin-tazobactam (ZOSYN)  IV Stopped (09/20/16 0935)   PRN: acetaminophen, guaiFENesin-dextromethorphan, levalbuterol, morphine injection, nitroGLYCERIN, ondansetron (ZOFRAN) IV, sodium chloride Anti-infectives    Start     Dose/Rate Route Frequency Ordered Stop   09/17/16 0630  piperacillin-tazobactam (ZOSYN) IVPB 3.375 g     3.375 g 12.5 mL/hr over 240 Minutes  Intravenous Every 8 hours 09/17/16 0615     09/14/16 1000  doxycycline (VIBRA-TABS) tablet 100 mg  Status:  Discontinued     100 mg Oral Every 12 hours 09/14/16 0832 09/17/16 0612     Assessment: 83 yo with HCAP requiring bipap starting zosyn.   Zosyn 4/28 >>  Plan:  Continue Zosyn 3.375 grams Q8 hours.   Monitor labs, progress, c/s  Valrie Hart A, RPH 09/20/2016,11:42 AM   Cont zosyn 3.375 gm IV q8 hours f/u renal function, cultures and clinical course Talbert Cage, PharmD

## 2016-09-20 NOTE — Progress Notes (Signed)
Patient weaned off O2 [place] 3L and is currently 95% on RA.

## 2016-09-20 NOTE — Progress Notes (Signed)
Subjective: She is significantly better this morning. She's very confused and she had some bleeding so she's off Lovenox. Her breathing is much better. She had speech evaluation and no obvious aspiration  Objective: Vital signs in last 24 hours: Temp:  [97.8 F (36.6 C)-98.4 F (36.9 C)] 97.9 F (36.6 C) (05/01 0400) Pulse Rate:  [60-134] 94 (05/01 0900) Resp:  [13-23] 19 (05/01 0900) BP: (73-149)/(49-85) 116/77 (05/01 0842) SpO2:  [91 %-99 %] 92 % (05/01 0909) Weight:  [64.1 kg (141 lb 5 oz)] 64.1 kg (141 lb 5 oz) (05/01 0500) Weight change: 0.2 kg (7.1 oz) Last BM Date: 09/19/16  Intake/Output from previous day: 04/30 0701 - 05/01 0700 In: 1421 [P.O.:120; I.V.:1101; IV Piggyback:200] Out: 1350 [Urine:1350]  PHYSICAL EXAM General appearance: alert, mild distress and Confused Resp: Mild rhonchi but clearer than before Cardio: irregularly irregular rhythm GI: soft, non-tender; bowel sounds normal; no masses,  no organomegaly Extremities: extremities normal, atraumatic, no cyanosis or edema Skin warm and dry  Lab Results:  Results for orders placed or performed during the hospital encounter of 09/13/16 (from the past 48 hour(s))  CBC     Status: Abnormal   Collection Time: 09/19/16  6:28 AM  Result Value Ref Range   WBC 20.2 (H) 4.0 - 10.5 K/uL   RBC 5.40 (H) 3.87 - 5.11 MIL/uL   Hemoglobin 15.6 (H) 12.0 - 15.0 g/dL   HCT 46.9 (H) 36.0 - 46.0 %   MCV 86.9 78.0 - 100.0 fL   MCH 28.9 26.0 - 34.0 pg   MCHC 33.3 30.0 - 36.0 g/dL   RDW 14.2 11.5 - 15.5 %   Platelets 198 150 - 400 K/uL  Basic metabolic panel     Status: Abnormal   Collection Time: 09/19/16  6:28 AM  Result Value Ref Range   Sodium 132 (L) 135 - 145 mmol/L   Potassium 3.4 (L) 3.5 - 5.1 mmol/L   Chloride 89 (L) 101 - 111 mmol/L   CO2 33 (H) 22 - 32 mmol/L   Glucose, Bld 115 (H) 65 - 99 mg/dL   BUN 27 (H) 6 - 20 mg/dL   Creatinine, Ser 0.56 0.44 - 1.00 mg/dL   Calcium 8.8 (L) 8.9 - 10.3 mg/dL   GFR calc  non Af Amer >60 >60 mL/min   GFR calc Af Amer >60 >60 mL/min    Comment: (NOTE) The eGFR has been calculated using the CKD EPI equation. This calculation has not been validated in all clinical situations. eGFR's persistently <60 mL/min signify possible Chronic Kidney Disease.    Anion gap 10 5 - 15  Occult blood card to lab, stool     Status: Abnormal   Collection Time: 09/19/16  8:47 AM  Result Value Ref Range   Fecal Occult Bld POSITIVE (A) NEGATIVE  Glucose, capillary     Status: Abnormal   Collection Time: 09/19/16 11:39 AM  Result Value Ref Range   Glucose-Capillary 119 (H) 65 - 99 mg/dL  CBC     Status: Abnormal   Collection Time: 09/20/16  5:20 AM  Result Value Ref Range   WBC 18.4 (H) 4.0 - 10.5 K/uL   RBC 4.93 3.87 - 5.11 MIL/uL   Hemoglobin 14.2 12.0 - 15.0 g/dL   HCT 42.4 36.0 - 46.0 %   MCV 86.0 78.0 - 100.0 fL   MCH 28.8 26.0 - 34.0 pg   MCHC 33.5 30.0 - 36.0 g/dL   RDW 14.1 11.5 - 15.5 %  Platelets 179 150 - 400 K/uL  Basic metabolic panel     Status: Abnormal   Collection Time: 09/20/16  5:20 AM  Result Value Ref Range   Sodium 133 (L) 135 - 145 mmol/L   Potassium 3.1 (L) 3.5 - 5.1 mmol/L   Chloride 91 (L) 101 - 111 mmol/L   CO2 33 (H) 22 - 32 mmol/L   Glucose, Bld 139 (H) 65 - 99 mg/dL   BUN 25 (H) 6 - 20 mg/dL   Creatinine, Ser 0.51 0.44 - 1.00 mg/dL   Calcium 8.1 (L) 8.9 - 10.3 mg/dL   GFR calc non Af Amer >60 >60 mL/min   GFR calc Af Amer >60 >60 mL/min    Comment: (NOTE) The eGFR has been calculated using the CKD EPI equation. This calculation has not been validated in all clinical situations. eGFR's persistently <60 mL/min signify possible Chronic Kidney Disease.    Anion gap 9 5 - 15    ABGS No results for input(s): PHART, PO2ART, TCO2, HCO3 in the last 72 hours.  Invalid input(s): PCO2 CULTURES Recent Results (from the past 240 hour(s))  MRSA PCR Screening     Status: None   Collection Time: 09/13/16  6:15 AM  Result Value Ref Range  Status   MRSA by PCR NEGATIVE NEGATIVE Final    Comment:        The GeneXpert MRSA Assay (FDA approved for NASAL specimens only), is one component of a comprehensive MRSA colonization surveillance program. It is not intended to diagnose MRSA infection nor to guide or monitor treatment for MRSA infections.    Studies/Results: No results found.  Medications:  Prior to Admission:  Prescriptions Prior to Admission  Medication Sig Dispense Refill Last Dose  . acetaminophen (TYLENOL) 500 MG tablet Take 500 mg by mouth every 6 (six) hours as needed for mild pain. For pain    09/11/2016  . albuterol (PROVENTIL) (2.5 MG/3ML) 0.083% nebulizer solution Take 2.5 mg by nebulization 2 (two) times daily as needed.   09/12/2016 at Unknown time  . alendronate (FOSAMAX) 70 MG tablet Take 70 mg by mouth once a week. Take with a full glass of water on an empty stomach.   09/07/2016  . aspirin (ASPIRIN EC) 81 MG EC tablet Take 1 tablet (81 mg total) by mouth daily. Swallow whole.   09/12/2016 at Unknown time  . atorvastatin (LIPITOR) 40 MG tablet Take 40 mg by mouth. Monday, Wednesday, & Friday evening.   09/12/2016 at Unknown time  . Calcium Carbonate-Vit D-Min (CALTRATE 600+D PLUS) 600-400 MG-UNIT per tablet Take 1 tablet by mouth 2 (two) times daily.     09/12/2016 at Unknown time  . digoxin (LANOXIN) 0.25 MG tablet Take 0.025 mg by mouth every evening.   09/12/2016 at Unknown time  . diltiazem (CARDIZEM) 30 MG tablet Take 15 mg by mouth 2 (two) times daily.   09/12/2016 at Unknown time  . docusate sodium (COLACE) 100 MG capsule Take 100 mg by mouth at bedtime.   09/11/2016 at Unknown time  . furosemide (LASIX) 40 MG tablet Take 10 mg by mouth 2 (two) times daily.    09/12/2016 at Unknown time  . levETIRAcetam (KEPPRA) 250 MG tablet Takes one tablet in the morning and half a tablet at 3 pm.   09/12/2016 at Unknown time  . levothyroxine (SYNTHROID, LEVOTHROID) 25 MCG tablet Take 25 mcg by mouth daily before  breakfast.   09/12/2016 at Unknown time  . midodrine (PROAMATINE) 2.5 MG  tablet Take 1 tablet (2.5 mg total) by mouth 2 (two) times daily with a meal. The dosing can be titrated down to once daily when her systolic blood pressure is consistently in the 100s.   09/12/2016 at Unknown time  . Multiple Vitamins-Minerals (ICAPS) CAPS Take 1 capsule by mouth 2 (two) times daily.   09/12/2016 at Unknown time  . nitroGLYCERIN (NITROSTAT) 0.4 MG SL tablet Place 0.4 mg under the tongue every 5 (five) minutes as needed for chest pain.   unknown  . omeprazole (PRILOSEC OTC) 20 MG tablet Take 20 mg by mouth. Monday, Wednesday, & Friday evening.   09/11/2016  . potassium chloride (KLOR-CON) 10 MEQ CR tablet Take 10 mEq by mouth daily.     09/12/2016 at Unknown time  . predniSONE (DELTASONE) 10 MG tablet Take 4 tablets (40 mg total) by mouth daily. 20 tablet 0 09/11/2016  . Probiotic Product (PROBIOTIC DAILY PO) Take 1 tablet by mouth every morning.   09/11/2016   Scheduled: . acetaminophen  650 mg Oral TID  . atorvastatin  40 mg Oral q1800  . calcium-vitamin D  1 tablet Oral BID  . chlorhexidine  15 mL Mouth Rinse BID  . digoxin  0.125 mg Oral QPM  . diltiazem  30 mg Oral Q6H  . feeding supplement (ENSURE ENLIVE)  237 mL Oral BID BM  . fluticasone  2 spray Each Nare Daily  . guaiFENesin  1,200 mg Oral BID  . ipratropium  0.5 mg Nebulization TID  . levalbuterol  0.63 mg Nebulization TID  . levETIRAcetam  125 mg Oral Q1500  . levETIRAcetam  250 mg Oral q morning - 10a  . levothyroxine  25 mcg Oral QAC breakfast  . loratadine  10 mg Oral Daily  . mouth rinse  15 mL Mouth Rinse q12n4p  . methylPREDNISolone (SOLU-MEDROL) injection  60 mg Intravenous Q12H  . midodrine  2.5 mg Oral BID WC  . multivitamin-lutein  1 capsule Oral BID  . pantoprazole (PROTONIX) IV  40 mg Intravenous Q12H  . potassium chloride  40 mEq Oral Q4H   Continuous: . sodium chloride 20 mL/hr at 09/13/16 0648  . sodium chloride 10 mL/hr at  09/19/16 0600  . diltiazem (CARDIZEM) infusion 6 mg/hr (09/20/16 0101)  . piperacillin-tazobactam (ZOSYN)  IV 3.375 g (09/20/16 0530)   EZM:[id number], guaiFENesin-dextromethorphan, levalbuterol, morphine injection, nitroGLYCERIN, ondansetron (ZOFRAN) IV, sodium chloride  Assesment: She was admitted with COPD exacerbation. She is significantly better. She has chronic diastolic heart failure she was treated for an acute exacerbation because she got worse but is not totally clear that she actually had an acute exacerbation. She may have aspirated but her speech evaluation did not show any obvious aspiration now. She is improving. She is very confused. Principal Problem:   COPD with acute exacerbation ([place]) Active Problems:   Mixed hyperlipidemia   DIASTOLIC HEART FAILURE, CHRONIC   Hypotension   Alzheimer disease   Seizures ([facility])   Hypokalemia   Atrial fibrillation with RVR ([facility])   COPD exacerbation ([facility])   Hypoxemia    Plan: Continue treatments. I will plan to follow more peripherally. Thanks very much for allowing me to see her with you    LOS: 7 days   Treyshawn Muldrew L 09/20/2016, 9:17 AM

## 2016-09-21 LAB — BASIC METABOLIC PANEL
Anion gap: 8 (ref 5–15)
BUN: 27 mg/dL — ABNORMAL HIGH (ref 6–20)
CALCIUM: 8.3 mg/dL — AB (ref 8.9–10.3)
CO2: 30 mmol/L (ref 22–32)
CREATININE: 0.52 mg/dL (ref 0.44–1.00)
Chloride: 96 mmol/L — ABNORMAL LOW (ref 101–111)
GFR calc non Af Amer: >60 mL/min (ref >60)
Glucose, Bld: 131 mg/dL — ABNORMAL HIGH (ref 65–99)
Potassium: 3.8 mmol/L (ref 3.5–5.1)
SODIUM: 134 mmol/L — AB (ref 135–145)

## 2016-09-21 LAB — CBC
HCT: 42.1 % (ref 36.0–46.0)
Hemoglobin: 13.7 g/dL (ref 12.0–15.0)
MCH: 28.4 pg (ref 26.0–34.0)
MCHC: 32.5 g/dL (ref 30.0–36.0)
MCV: 87.2 fL (ref 78.0–100.0)
Platelets: 162 10*3/uL (ref 150–400)
RBC: 4.83 MIL/uL (ref 3.87–5.11)
RDW: 14.3 % (ref 11.5–15.5)
WBC: 21.2 10*3/uL — ABNORMAL HIGH (ref 4.0–10.5)

## 2016-09-21 MED ORDER — LEVOFLOXACIN 500 MG PO TABS: 500.0000 mg | ORAL_TABLET | Freq: Every day | ORAL | 0 refills | Status: DC

## 2016-09-21 MED ORDER — DIGOXIN 125 MCG PO TABS: 0.1250 mg | ORAL_TABLET | Freq: Every evening | ORAL | 2 refills | Status: DC

## 2016-09-21 MED ORDER — PREDNISONE 10 MG PO TABS: 10.0000 mg | ORAL_TABLET | Freq: Every day | ORAL | 0 refills | Status: DC

## 2016-09-21 MED ORDER — DILTIAZEM HCL 30 MG PO TABS: 30.0000 mg | ORAL_TABLET | Freq: Four times a day (QID) | ORAL | 2 refills | Status: DC

## 2016-09-21 MED ORDER — GUAIFENESIN ER 600 MG PO TB12: 1200.0000 mg | ORAL_TABLET | Freq: Two times a day (BID) | ORAL | Status: DC

## 2016-09-21 NOTE — Care Management Note (Signed)
Case Management Note  Patient Details  Name: AMIRRAH QUIGLEY MRN: [id number] Date of Birth: [birth year]    Expected Discharge Date:  09/21/16               Expected Discharge Plan:  Assisted Living / Rest Home  In-House Referral:  Clinical Social Work  Discharge planning Services  CM Consult  Post Acute Care Choice:    Choice offered to:     DME Arranged:    DME Agency:     HH Arranged:    HH Agency:     Status of Service:  Completed, signed off  If discussed at [employer], dates discussed:    Additional Comments: Patient discharging back to [facility] today. PT services will be provided by [facility]. RW will be delivered to room prior to discharge by [facility].   Trevis Eden, [name], RN 09/21/2016, 10:53 AM

## 2016-09-21 NOTE — Discharge Instructions (Signed)
Asthma, Adult Asthma is a condition of the lungs in which the airways tighten and narrow. Asthma can make it hard to breathe. Asthma cannot be cured, but medicine and lifestyle changes can help control it. Asthma may be started (triggered) by:  Animal skin flakes (dander).  Dust.  Cockroaches.  Pollen.  Mold.  Smoke.  Cleaning products.  Hair sprays or aerosol sprays.  Paint fumes or strong smells.  Cold air, weather changes, and winds.  Crying or laughing hard.  Stress.  Certain medicines or drugs.  Foods, such as dried fruit, potato chips, and sparkling grape juice.  Infections or conditions (colds, flu).  Exercise.  Certain medical conditions or diseases.  Exercise or tiring activities. Follow these instructions at home:  Take medicine as told by your doctor.  Use a peak flow meter as told by your doctor. A peak flow meter is a tool that measures how well the lungs are working.  Record and keep track of the peak flow meter's readings.  Understand and use the asthma action plan. An asthma action plan is a written plan for taking care of your asthma and treating your attacks.  To help prevent asthma attacks:  Do not smoke. Stay away from secondhand smoke.  Change your heating and air conditioning filter often.  Limit your use of fireplaces and wood stoves.  Get rid of pests (such as roaches and mice) and their droppings.  Throw away plants if you see mold on them.  Clean your floors. Dust regularly. Use cleaning products that do not smell.  Have someone vacuum when you are not home. Use a vacuum cleaner with a HEPA filter if possible.  Replace carpet with wood, tile, or vinyl flooring. Carpet can trap animal skin flakes and dust.  Use allergy-proof pillows, mattress covers, and box spring covers.  Wash bed sheets and blankets every week in hot water and dry them in a dryer.  Use blankets that are made of polyester or cotton.  Clean bathrooms  and kitchens with bleach. If possible, have someone repaint the walls in these rooms with mold-resistant paint. Keep out of the rooms that are being cleaned and painted.  Wash hands often. Contact a doctor if:  You have make a whistling sound when breaking (wheeze), have shortness of breath, or have a cough even if taking medicine to prevent attacks.  The colored mucus you cough up (sputum) is thicker than usual.  The colored mucus you cough up changes from clear or white to yellow, green, gray, or bloody.  You have problems from the medicine you are taking such as:  A rash.  Itching.  Swelling.  Trouble breathing.  You need reliever medicines more than 2-3 times a week.  Your peak flow measurement is still at 50-79% of your personal best after following the action plan for 1 hour.  You have a fever. Get help right away if:  You seem to be worse and are not responding to medicine during an asthma attack.  You are short of breath even at rest.  You get short of breath when doing very little activity.  You have trouble eating, drinking, or talking.  You have chest pain.  You have a fast heartbeat.  Your lips or fingernails start to turn blue.  You are light-headed, dizzy, or faint.  Your peak flow is less than 50% of your personal best. This information is not intended to replace advice given to you by your health care provider. Make sure  you discuss any questions you have with your health care provider. Document Released: 10/26/2007 Document Revised: 10/15/2015 Document Reviewed: 12/06/2012 Elsevier Interactive Patient Education  2017 [employer]   Acute Respiratory Distress Syndrome, Adult Acute respiratory distress syndrome is a life-threatening condition in which fluid collects in the lungs. This prevents the lungs from filling with air and passing oxygen into the blood. This can cause the lungs and other vital organs to fail. The condition usually develops  following an infection, illness, surgery, or injury. What are the causes? This condition may be caused by:  An infection, such as sepsis or pneumonia.  A serious injury to the head or chest.  Severe bleeding from an injury.  A major surgery.  Breathing in harmful chemicals or smoke.  Blood transfusions.  A blood clot in the lungs.  Breathing in vomit (aspiration).  Near-drowning.  Inflammation of the pancreas (pancreatitis).  A drug overdose. What are the signs or symptoms? Sudden shortness of breath and rapid breathing are the main symptoms of this condition. Other symptoms may include:  A fast or irregular heartbeat.  Skin, lips, or fingernails that look blue (cyanosis).  Confusion.  Tiredness or loss of energy.  Chest pain, particularly while taking a breath.  Coughing.  Restlessness or anxiety.  Fever. This is usually present if there is an underlying infection, such as pneumonia. How is this diagnosed? This condition is diagnosed based on:  Your symptoms.  Medical history.  A physical exam. During the exam, your health care provider will listen to your heart and check for crackling or wheezing sounds in your lungs. You may also have other tests to confirm the diagnosis and measure how well your lungs are working. These may include:  Measuring the amount of oxygen in your blood. Your health care provider will use two methods to do this procedure:  A small device (pulse oximeter) that is placed on your finger, earlobe, or toe.  An arterial blood gas test. A sample of blood is taken from an artery and tested for oxygen levels.  Blood tests.  Chest X-rays or CT scans to look for fluid in the lungs.  Taking a sample of your sputum to test for infection.  Heart test, such as an echocardiogram or electrocardiogram. This is done to rule out any heart problems (such as heart failure) that may be causing your symptoms.  Bronchoscopy. During this test, a  thin, flexible tube with a light is passed into the mouth or nose, down the windpipe, and into the lungs. How is this treated? Treatment depends on the cause of your condition. The goal is to support you while your lungs heal and the underlying cause is treated. Treatment may include:  Oxygen therapy. This may be done through:  A tube in your nose or a face mask.  A ventilator. This device helps move air into and out of your lungs through a breathing tube that is inserted into your mouth or nose.  Continuous positive airway pressure (CPAP). This treatment uses mild air pressure to keep the airways open. A mask or other device will be placed over your nose or mouth.  Tracheostomy. During this procedure, a small cut is made in your neck to create an opening to your windpipe. A breathing tube is placed directly into your windpipe. The breathing tube is connected to a ventilator. This is done if you have problems with your airway or if you need a ventilator for a long period of time.  Positioning you to lie on your stomach (prone position).  Medicines, such as:  Sedatives to help you relax.  Blood pressure medicines.  Antibiotics to treat infection.  Blood thinners to prevent blood clots.  Diuretics to help prevent excess fluid.  Fluids and nutrients given through an IV tube.  Wearing compression stockings on your legs to prevent blood clots.  Extra corporeal membrane oxygenation (ECMO). This treatment takes blood outside your body, adds oxygen, and removes carbon dioxide. The blood is then returned to your body. This treatment is only used in severe cases. Follow these instructions at home:  Take over-the-counter and prescription medicines only as told by your health care provider.  Do not use any products that contain nicotine or tobacco, such as cigarettes and e-cigarettes. If you need help quitting, ask your health care provider.  Limit alcohol intake to no more than 1 drink  per day for nonpregnant women and 2 drinks per day for men. One drink equals 12 oz of beer, 5 oz of wine, or 1 oz of hard liquor.  Ask friends and family to help you if daily activities make you tired.  Attend any pulmonary rehabilitation as told by your health care provider. This may include:  Education about your condition.  Exercises.  Breathing training.  Counseling.  Learning techniques to conserve energy.  Nutrition counseling.  Keep all follow-up visits as told by your health care provider. This is important. Contact a health care provider if:  You become short of breath during activity or while resting.  You develop a cough that does not go away.  You have a fever.  Your symptoms do not get better or they get worse.  You become anxious or depressed. Get help right away if:  You have sudden shortness of breath.  You develop sudden chest pain that does not go away.  You develop a rapid heart rate.  You develop swelling or pain in one of your legs.  You cough up blood.  You have trouble breathing.  Your skin, lips, or fingernails turn blue. These symptoms may represent a serious problem that is an emergency. Do not wait to see if the symptoms will go away. Get medical help right away. Call your local emergency services (911 in the [place] Do not drive yourself to the hospital. Summary  Acute respiratory distress syndrome is a life-threatening condition in which fluid collects in the lungs, which leads the lungs and other vital organs to fail.  This condition usually develops following an infection, illness, surgery, or injury.  Sudden shortness of breath and rapid breathing are the main symptoms of acute respiratory distress syndrome.  Treatment may include oxygen therapy, continuous positive airway pressure (CPAP), tracheostomy, lying on your stomach (prone position), medicines, fluids and nutrients given through an IV tube, compression stockings, and extra  corporeal membrane oxygenation (ECMO). This information is not intended to replace advice given to you by your health care provider. Make sure you discuss any questions you have with your health care provider. Document Released: 05/09/2005 Document Revised: 04/25/2016 Document Reviewed: 04/25/2016 Elsevier Interactive Patient Education  2017 [employer].

## 2016-09-21 NOTE — [place] FL2 (Signed)
[place] MEDICAID FL2 LEVEL OF CARE SCREENING TOOL     IDENTIFICATION  Patient Name: Deborah [name] Birthdate: 04/11/1926 Sex: female Admission Date (Current Location): 09/13/2016  [facility] and [place] Number:  [employer] and Address:  [facility],  [address] [place] [address]      Provider Number: [id number]  Attending Physician Name and Address:  Micael Hampshire Acost*  Relative Name and Phone Number:       Current Level of Care: Hospital Recommended Level of Care: Assisted Living Facility Prior Approval Number:    Date Approved/Denied:   PASRR Number:    Discharge Plan: Other (Comment) [facility])    Current Diagnoses: Patient Active Problem List   Diagnosis Date Noted  . Hypoxemia   . COPD with acute exacerbation (HCC) 09/13/2016  . Alzheimer disease 09/13/2016  . Seizures (HCC) 09/13/2016  . Hypokalemia 09/13/2016  . Atrial fibrillation with RVR (HCC) 09/13/2016  . COPD exacerbation (HCC) 09/13/2016  . Hypotension 04/22/2012  . Coronary atherosclerosis of native coronary artery   . COPD (chronic obstructive pulmonary disease) (HCC)   . Mixed hyperlipidemia 05/14/2009  . Permanent atrial fibrillation (HCC) 02/10/2009  . DIASTOLIC HEART FAILURE, CHRONIC 02/10/2009    Orientation RESPIRATION BLADDER Height & Weight     Self   (No oxygen needs at this time. ) Incontinent Weight: 141 lb 5 oz (64.1 kg) Height:   (165.1 cm)  BEHAVIORAL SYMPTOMS/MOOD NEUROLOGICAL BOWEL NUTRITION STATUS      Incontinent Diet (DIET DYS 2. patient must be alert and upright for all po. medications whole in puree. supervison with meals due to low vision. diet low sodium heart healthy)  AMBULATORY STATUS COMMUNICATION OF NEEDS Skin   Limited Assist Verbally                         Personal Care Assistance Level of Assistance  Bathing, Dressing, Feeding Bathing Assistance: Limited assistance Feeding assistance:  Independent       Functional Limitations Info  Sight, Hearing, Speech Sight Info: Impaired Hearing Info: Adequate Speech Info: Adequate    SPECIAL CARE FACTORS FREQUENCY  PT (By licensed PT)     PT Frequency: 3x/week              Contractures Contractures Info: Not present    Additional Factors Info  Code Status, Allergies Code Status Info: DNR Allergies Info: Ivp Dye, Omnipaque, Sulfa Antibiotics, Carbamazepine, Dilaudid, Tramadol           Current Medications (09/21/2016):  This is the current hospital active medication list Current Facility-Administered Medications  Medication Dose Route Frequency Provider Last Rate Last Dose  . 0.9 %  sodium chloride infusion   Intravenous Continuous Lorre Nick, MD 20 mL/hr at 09/13/16 [phone]    . 0.9 %  sodium chloride infusion   Intravenous Continuous Lorre Nick, MD 10 mL/hr at 09/19/16 0600    . acetaminophen (TYLENOL) tablet 650 mg  650 mg Oral Q6H PRN Bobette Mo, MD   650 mg at 09/17/16 1756  . acetaminophen (TYLENOL) tablet 650 mg  650 mg Oral TID Alba Cory, MD   650 mg at 09/21/16 0954  . atorvastatin (LIPITOR) tablet 40 mg  40 mg Oral q1800 Bobette Mo, MD   40 mg at 09/20/16 1733  . calcium-vitamin D (OSCAL WITH D) 500-200 MG-UNIT per tablet 1 tablet  1 tablet Oral BID Bobette Mo, MD  1 tablet at 09/21/16 0954  . chlorhexidine (PERIDEX) 0.12 % solution 15 mL  15 mL Mouth Rinse BID Belkys A Regalado, MD   15 mL at 09/21/16 0955  . digoxin (LANOXIN) tablet 0.125 mg  0.125 mg Oral QPM Bobette Mo, MD   0.125 mg at 09/20/16 1733  . diltiazem (CARDIZEM) tablet 30 mg  30 mg Oral Q6H Belkys A Regalado, MD   30 mg at 09/21/16 0559  . feeding supplement (ENSURE ENLIVE) (ENSURE ENLIVE) liquid 237 mL  237 mL Oral BID BM Belkys A Regalado, MD   237 mL at 09/20/16 1444  . fluticasone (FLONASE) 50 MCG/ACT nasal spray 2 spray  2 spray Each Nare Daily Kari Baars, MD   2 spray at 09/21/16 0955  .  guaiFENesin (MUCINEX) 12 hr tablet 1,200 mg  1,200 mg Oral BID Kari Baars, MD   1,200 mg at 09/21/16 0954  . guaiFENesin-dextromethorphan (ROBITUSSIN DM) 100-10 MG/5ML syrup 5 mL  5 mL Oral Q4H PRN Belkys A Regalado, MD   5 mL at 09/18/16 1033  . ipratropium (ATROVENT) nebulizer solution 0.5 mg  0.5 mg Nebulization TID Belkys A Regalado, MD   0.5 mg at 09/21/16 0745  . levalbuterol (XOPENEX) nebulizer solution 0.63 mg  0.63 mg Nebulization Q6H PRN Bobette Mo, MD      . levalbuterol [facility]) nebulizer solution 0.63 mg  0.63 mg Nebulization TID Belkys A Regalado, MD   0.63 mg at 09/21/16 0745  . levETIRAcetam (KEPPRA) tablet 125 mg  125 mg Oral Q1500 Filbert Schilder, MD   125 mg at 09/20/16 1443  . levETIRAcetam (KEPPRA) tablet 250 mg  250 mg Oral q morning - 10a Filbert Schilder, MD   250 mg at 09/21/16 0954  . levothyroxine (SYNTHROID, LEVOTHROID) tablet 25 mcg  25 mcg Oral QAC breakfast Bobette Mo, MD   25 mcg at 09/21/16 [phone]  . loratadine (CLARITIN) tablet 10 mg  10 mg Oral Daily Bobette Mo, MD   10 mg at 09/21/16 0954  . MEDLINE mouth rinse  15 mL Mouth Rinse q12n4p Belkys A Regalado, MD   15 mL at 09/20/16 1600  . methylPREDNISolone sodium succinate (SOLU-MEDROL) 125 mg/2 mL injection 60 mg  60 mg Intravenous Q12H Belkys A Regalado, MD   60 mg at 09/21/16 0600  . midodrine (PROAMATINE) tablet 2.5 mg  2.5 mg Oral BID WC Bobette Mo, MD   2.5 mg at 09/21/16 0813  . morphine 2 MG/ML injection 0.5 mg  0.5 mg Intravenous Q3H PRN Belkys A Regalado, MD   0.5 mg at 09/20/16 2324  . multivitamin-lutein (OCUVITE-LUTEIN) capsule 1 capsule  1 capsule Oral BID Bobette Mo, MD   1 capsule at 09/21/16 0954  . nitroGLYCERIN (NITROSTAT) SL tablet 0.4 mg  0.4 mg Sublingual Q5 min PRN Bobette Mo, MD      . ondansetron [facility]) injection 4 mg  4 mg Intravenous Q6H PRN Levie Heritage, DO   4 mg at 09/16/16 2041  . pantoprazole (PROTONIX) injection 40 mg   40 mg Intravenous Q12H Belkys A Regalado, MD   40 mg at 09/21/16 0954  . piperacillin-tazobactam (ZOSYN) IVPB 3.375 g  3.375 g Intravenous Q8H Belkys A Regalado, MD 12.5 mL/hr at 09/21/16 0600 3.375 g at 09/21/16 0600  . sodium chloride (OCEAN) 0.65 % nasal spray 1 spray  1 spray Each Nare PRN Kari Baars, MD          Discharge Medications:  TAKE these medications   acetaminophen 500 MG tablet Commonly known as:  TYLENOL Take 500 mg by mouth every 6 (six) hours as needed for mild pain. For pain   albuterol (2.5 MG/3ML) 0.083% nebulizer solution Commonly known as:  PROVENTIL Take 2.5 mg by nebulization 2 (two) times daily as needed.   alendronate 70 MG tablet Commonly known as:  FOSAMAX Take 70 mg by mouth once a week. Take with a full glass of water on an empty stomach.   aspirin 81 MG EC tablet Commonly known as:  aspirin EC Take 1 tablet (81 mg total) by mouth daily. Swallow whole.   atorvastatin 40 MG tablet Commonly known as:  LIPITOR Take 40 mg by mouth. Monday, Wednesday, & Friday evening.   CALTRATE 600+D PLUS 600-400 MG-UNIT per tablet Take 1 tablet by mouth 2 (two) times daily.   digoxin 0.125 MG tablet Commonly known as:  LANOXIN Take 1 tablet (0.125 mg total) by mouth every evening. What changed:  medication strength  how much to take   diltiazem 30 MG tablet Commonly known as:  CARDIZEM Take 1 tablet (30 mg total) by mouth every 6 (six) hours. What changed:  how much to take  when to take this   docusate sodium 100 MG capsule Commonly known as:  COLACE Take 100 mg by mouth at bedtime.   furosemide 40 MG tablet Commonly known as:  LASIX Take 10 mg by mouth 2 (two) times daily.   guaiFENesin 600 MG 12 hr tablet Commonly known as:  MUCINEX Take 2 tablets (1,200 mg total) by mouth 2 (two) times daily.   ICAPS Caps Take 1 capsule by mouth 2 (two) times daily.   levETIRAcetam 250 MG tablet Commonly known as:  KEPPRA Takes one  tablet in the morning and half a tablet at 3 pm.   levofloxacin 500 MG tablet Commonly known as:  LEVAQUIN Take 1 tablet (500 mg total) by mouth daily.   levothyroxine 25 MCG tablet Commonly known as:  SYNTHROID, LEVOTHROID Take 25 mcg by mouth daily before breakfast.   midodrine 2.5 MG tablet Commonly known as:  PROAMATINE Take 1 tablet (2.5 mg total) by mouth 2 (two) times daily with a meal. The dosing can be titrated down to once daily when her systolic blood pressure is consistently in the 100s.   nitroGLYCERIN 0.4 MG SL tablet Commonly known as:  NITROSTAT Place 0.4 mg under the tongue every 5 (five) minutes as needed for chest pain.   omeprazole 20 MG tablet Commonly known as:  PRILOSEC OTC Take 20 mg by mouth. Monday, Wednesday, & Friday evening.   potassium chloride 10 MEQ CR tablet Commonly known as:  KLOR-CON Take 10 mEq by mouth daily.   predniSONE 10 MG tablet Commonly known as:  DELTASONE Take 1 tablet (10 mg total) by mouth daily with breakfast. Take 6 tablets today and then decrease by 1 tablet daily until none are left. What changed:  how much to take  when to take this  additional instructions   PROBIOTIC DAILY PO Take 1 tablet by mouth every morning.        Relevant Imaging Results:  Relevant Lab Results:   Additional Information    Eliza Green, Juleen China, LCSW

## 2016-09-21 NOTE — Discharge Summary (Signed)
Physician Discharge Summary  Deborah [name] ZOX:[id number] DOB: 01/24/1926 DOA: 09/13/2016  PCP: Ignatius Specking, MD  Admit date: 09/13/2016 Discharge date: 09/21/2016  Time spent: 45 minutes  Recommendations for Outpatient Follow-up:  -Will be discharged back to ALF today. -Advised to follow up with PCP in 2 weeks.   Discharge Diagnoses:  Principal Problem:   COPD with acute exacerbation (HCC) Active Problems:   Mixed hyperlipidemia   DIASTOLIC HEART FAILURE, CHRONIC   Hypotension   Alzheimer disease   Seizures (HCC)   Hypokalemia   Atrial fibrillation with RVR (HCC)   COPD exacerbation (HCC)   Hypoxemia   Discharge Condition: Stable and improved  Filed Weights   09/18/16 0500 09/19/16 0500 09/20/16 0500  Weight: 63.9 kg (140 lb 14 oz) 63.9 kg (140 lb 14 oz) 64.1 kg (141 lb 5 oz)    History of present illness:  As per Dr. Robb Matar on 4/24: Deborah [name] is a 83 y.o. female with medical history significant of Alzheimer's disease, asthma, atrial fibrillation, chronic diastolic heart failure, COPD, CAD, hypertension, mixed hyperlipidemia, mild history of seizures who is returning to the ER due to shortness of breath.  Per patient and her daughter, on Thursday, the patient got exposed to environmental allergens during the day subsequently developing wheezing and dyspnea. She used her MDI and nebulizer without significant relief. She had to come to the emergency department on Saturday for treatment. She was discharged home with oral prednisone. They stated that the patient's symptoms improved during Sunday, but started recurring on Monday and she had to come again to the ED early in the morning today area. She denies fever, chills, but complains of fatigue. Positive pleuritic chest pain with light yellow sputum productive cough. She denies precordial chest pressure, palpitations, dizziness, diaphoresis or recent pitting edema lower extremities. She denies abdominal pain, nausea,  emesis, diarrhea or constipation. She denies dysuria frequency or hematuria.  ED Course: The patient was given supplemental oxygen, bronchodilators and Solu-Medrol experiencing significant relief. Her hemoglobin level was 15.3 g/dL, WBC 9.5 and platelets 179. Sodium 136, potassium 3.4, chloride 97 and bicarbonate 30 mmol/L. BUN 12, creatinine 0.57 and glucose 105 mg/dL. Troponin 0.03 ng/mL, BNP 134 pg/mL and EKG show atrial fibrillation with RVR at 129 BPM  Imaging: Her chest radiograph shows hyperinflation and emphysematous changes.  Hospital Course:   1-Acute COPD exacerbation; Acute hypoxic respiratory failure.  Thought initially to be Secondary to environmental factors. Worsening hypoxemia 4-27. treated with IV lasix, IV zosyn. Concern with episode of aspiration.  -no significant sign of aspiration on speech evaluation.  -no significant oxygen requirements at present. -Steroids and abx have been transitoned to PO for a 5 day course.  2-A fib RVR Continue with digoxin and Cardizem  Cardiology following.  Stable on new doses of PO cardizem and digoxin.  3-Melena, Diarrhea, Black stool. Occult blood positive.  Will discontinue laxatives. Discontinue aspirin and Lovenox.  Hb has remained stable Discussed with daughter, would not proceed with GI evaluation like endoscopy. Supportive care only.  Suspect may be abx-induced.  4 Chronic Diastolic HF  Do not believe she has had acute CHF. Resume lasix and K dosing.  5-hyperlipidemia; Continue with Lipitor.   6-Chronic hypotension;  Continue with midodrine.   7-Alzheimer;  At risk for delirium, but has not exhibited any this hospitalization.  8-Seizure;  Continue with Keppra.  No active seizures this hospitalization.  9-Hypothyroidism;  Continue with synthroid.   Leukocytosis;  Suspect related to steroids.  Trending down.   Hypokalemia; replete IV   Procedures:  None   Consultations:  Pulmonary, Dr.  Juanetta Gosling  Discharge Instructions  Discharge Instructions    Diet - low sodium heart healthy    Complete by:  As directed    Increase activity slowly    Complete by:  As directed      Allergies as of 09/21/2016      Reactions   Ivp Dye [iodinated Diagnostic Agents] Shortness Of Breath   ??   Omnipaque [iohexol] Shortness Of Breath, Other (See Comments)   Short of breath with chest tightness after IV injection in CT, pt was fine when she left department but developed symptoms in parking lot and went to emergency department.   Sulfa Antibiotics Shortness Of Breath   Carbamazepine    REACTION: toxemia   Dilaudid [hydromorphone] Nausea And Vomiting   Tramadol Other (See Comments)   confusion      Medication List    TAKE these medications   acetaminophen 500 MG tablet Commonly known as:  TYLENOL Take 500 mg by mouth every 6 (six) hours as needed for mild pain. For pain   albuterol (2.5 MG/3ML) 0.083% nebulizer solution Commonly known as:  PROVENTIL Take 2.5 mg by nebulization 2 (two) times daily as needed.   alendronate 70 MG tablet Commonly known as:  FOSAMAX Take 70 mg by mouth once a week. Take with a full glass of water on an empty stomach.   aspirin 81 MG EC tablet Commonly known as:  aspirin EC Take 1 tablet (81 mg total) by mouth daily. Swallow whole.   atorvastatin 40 MG tablet Commonly known as:  LIPITOR Take 40 mg by mouth. Monday, Wednesday, & Friday evening.   CALTRATE 600+D PLUS 600-400 MG-UNIT per tablet Take 1 tablet by mouth 2 (two) times daily.   digoxin 0.125 MG tablet Commonly known as:  LANOXIN Take 1 tablet (0.125 mg total) by mouth every evening. What changed:  medication strength  how much to take   diltiazem 30 MG tablet Commonly known as:  CARDIZEM Take 1 tablet (30 mg total) by mouth every 6 (six) hours. What changed:  how much to take  when to take this   docusate sodium 100 MG capsule Commonly known as:  COLACE Take 100 mg by  mouth at bedtime.   furosemide 40 MG tablet Commonly known as:  LASIX Take 10 mg by mouth 2 (two) times daily.   guaiFENesin 600 MG 12 hr tablet Commonly known as:  MUCINEX Take 2 tablets (1,200 mg total) by mouth 2 (two) times daily.   ICAPS Caps Take 1 capsule by mouth 2 (two) times daily.   levETIRAcetam 250 MG tablet Commonly known as:  KEPPRA Takes one tablet in the morning and half a tablet at 3 pm.   levofloxacin 500 MG tablet Commonly known as:  LEVAQUIN Take 1 tablet (500 mg total) by mouth daily.   levothyroxine 25 MCG tablet Commonly known as:  SYNTHROID, LEVOTHROID Take 25 mcg by mouth daily before breakfast.   midodrine 2.5 MG tablet Commonly known as:  PROAMATINE Take 1 tablet (2.5 mg total) by mouth 2 (two) times daily with a meal. The dosing can be titrated down to once daily when her systolic blood pressure is consistently in the 100s.   nitroGLYCERIN 0.4 MG SL tablet Commonly known as:  NITROSTAT Place 0.4 mg under the tongue every 5 (five) minutes as needed for chest pain.   omeprazole 20 MG tablet  Commonly known as:  PRILOSEC OTC Take 20 mg by mouth. Monday, Wednesday, & Friday evening.   potassium chloride 10 MEQ CR tablet Commonly known as:  KLOR-CON Take 10 mEq by mouth daily.   predniSONE 10 MG tablet Commonly known as:  DELTASONE Take 1 tablet (10 mg total) by mouth daily with breakfast. Take 6 tablets today and then decrease by 1 tablet daily until none are left. What changed:  how much to take  when to take this  additional instructions   PROBIOTIC DAILY PO Take 1 tablet by mouth every morning.      Allergies  Allergen Reactions  . Ivp Dye [Iodinated Diagnostic Agents] Shortness Of Breath    ??  . Omnipaque [Iohexol] Shortness Of Breath and Other (See Comments)    Short of breath with chest tightness after IV injection in CT, pt was fine when she left department but developed symptoms in parking lot and went to emergency  department.  . Sulfa Antibiotics Shortness Of Breath  . Carbamazepine     REACTION: toxemia  . Dilaudid [Hydromorphone] Nausea And Vomiting  . Tramadol Other (See Comments)    confusion   Follow-up Information    Ignatius Specking, MD. Schedule an appointment as soon as possible for a visit in 2 week(s).   Specialty:  Internal Medicine Contact information: [address] [place] [address] [phone]            The results of significant diagnostics from this hospitalization (including imaging, microbiology, ancillary and laboratory) are listed below for reference.    Significant Diagnostic Studies: Dg Chest 2 View  Result Date: 09/10/2016 CLINICAL DATA:  Initial evaluation for acute soreness of breath. History of asthma, COPD. EXAM: CHEST  2 VIEW COMPARISON:  Prior radiograph from 04/29/2012. FINDINGS: Cardiomegaly, stable from previous. Mediastinal silhouette within normal limits. Aortic atherosclerosis. In lungs mildly hyperexpanded with underlying changes related COPD. No focal infiltrates. Apparent increased density within the right upper lobe on frontal projection favored to be related to overlying EKG lead. No pulmonary edema or pleural effusion. No pneumothorax. Chronic compression deformities within the mid thoracic spine with exaggeration the normal thoracic kyphosis. No acute osseous abnormality. IMPRESSION: 1. No active cardiopulmonary disease. 2. COPD. 3. Stable cardiomegaly.  No pulmonary edema. 4. Aortic atherosclerosis. Electronically Signed   By: Rise Mu M.D.   On: 09/10/2016 21:08   Dg Chest Port 1 View  Result Date: 09/17/2016 CLINICAL DATA:  Hypoxemia EXAM: PORTABLE CHEST 1 VIEW COMPARISON:  09/16/2016 FINDINGS: The lungs are hyperinflated likely secondary to COPD. There is no focal parenchymal opacity. There is no pleural effusion or pneumothorax. There is stable cardiomegaly. The osseous structures are unremarkable. IMPRESSION: 1. COPD. 2. No acute  cardiopulmonary disease. Electronically Signed   By: Elige Ko   On: 09/17/2016 09:46   Dg Chest Port 1 View  Result Date: 09/16/2016 CLINICAL DATA:  Respiratory failure. EXAM: PORTABLE CHEST 1 VIEW COMPARISON:  09/13/2016 FINDINGS: Cardiomegaly. No confluent opacities, effusions or edema. No acute bony abnormality. IMPRESSION: Cardiomegaly.  No active disease. Electronically Signed   By: Charlett Nose M.D.   On: 09/16/2016 13:30   Dg Chest Port 1 View  Result Date: 09/13/2016 CLINICAL DATA:  83 year old female with shortness of breath. EXAM: PORTABLE CHEST 1 VIEW COMPARISON:  Chest radiograph dated 09/10/2016 FINDINGS: There is emphysematous changes of the lungs with interstitial coarsening. No focal consolidation, pleural effusion, or pneumothorax. Top-normal cardiac silhouette. No acute osseous pathology. IMPRESSION: 1. No  acute cardiopulmonary process. 2. Emphysema. Electronically Signed   By: Elgie Collard M.D.   On: 09/13/2016 03:11    Microbiology: Recent Results (from the past 240 hour(s))  MRSA PCR Screening     Status: None   Collection Time: 09/13/16  6:15 AM  Result Value Ref Range Status   MRSA by PCR NEGATIVE NEGATIVE Final    Comment:        The GeneXpert MRSA Assay (FDA approved for NASAL specimens only), is one component of a comprehensive MRSA colonization surveillance program. It is not intended to diagnose MRSA infection nor to guide or monitor treatment for MRSA infections.      Labs: Basic Metabolic Panel:  Recent Labs Lab 09/17/16 0549 09/18/16 0739 09/18/16 0748 09/19/16 0628 09/20/16 0520 09/21/16 0452  NA 132* 133*  --  132* 133* 134*  K 3.6 3.1*  --  3.4* 3.1* 3.8  CL 87* 87*  --  89* 91* 96*  CO2 36* 35*  --  33* 33* 30  GLUCOSE 132* 102*  --  115* 139* 131*  BUN 22* 29*  --  27* 25* 27*  CREATININE 0.59 0.56  --  0.56 0.51 0.52  CALCIUM 8.8* 8.8*  --  8.8* 8.1* 8.3*  MG  --   --  2.2  --   --   --    Liver Function Tests: No  results for input(s): AST, ALT, ALKPHOS, BILITOT, PROT, ALBUMIN in the last 168 hours. No results for input(s): LIPASE, AMYLASE in the last 168 hours. No results for input(s): AMMONIA in the last 168 hours. CBC:  Recent Labs Lab 09/17/16 0549 09/18/16 0739 09/19/16 0628 09/20/16 0520 09/21/16 0452  WBC 24.3* 20.1* 20.2* 18.4* 21.2*  HGB 15.3* 15.4* 15.6* 14.2 13.7  HCT 46.0 46.6* 46.9* 42.4 42.1  MCV 88.0 86.9 86.9 86.0 87.2  PLT 196 184 198 179 162   Cardiac Enzymes: No results for input(s): CKTOTAL, CKMB, CKMBINDEX, TROPONINI in the last 168 hours. BNP: BNP (last 3 results)  Recent Labs  09/10/16 2030 09/13/16 0302 09/16/16 1924  BNP 102.0* 134.0* 217.0*    ProBNP (last 3 results) No results for input(s): PROBNP in the last 8760 hours.  CBG:  Recent Labs Lab 09/19/16 1139  GLUCAP 119*       Signed:  HERNANDEZ [name],Deborah  [facility] Pager: [phone] 09/21/2016, 9:56 AM

## 2016-09-21 NOTE — Clinical Social Work Note (Signed)
LCSW notified Tammy at [facility] and patient's daughter of patient's discharge. Ms. Judithann Sheen, daughter, advised that she would transport patient back to the facility.    LCSW sent cllinicals to facility.    LCSW signing off.      Smt Lokey, Juleen China, LCSW

## 2016-09-21 NOTE — [place] FL2 (Deleted)
[place]ICA[place] FL2 LEVEL OF CARE SCREENING TOOL     IDENTIFICATION  Patient Name: Deborah [name] Birthdate: Jan 04, 1926 Sex: female Admission Date (Current Location): 09/13/2016  [facility] and [place] Number:  [employer] and Address:  [facility],  [address] [place] [address]      Provider Number: [id number]  Attending Physician Name and Address:  Micael Hampshire Acost*  Relative Name and Phone Number:       Current Level of Care: Hospital Recommended Level of Care: Assisted Living Facility Prior Approval Number:    Date Approved/Denied:   PASRR Number:    Discharge Plan: Other (Comment) [facility])    Current Diagnoses: Patient Active Problem List   Diagnosis Date Noted  . Hypoxemia   . COPD with acute exacerbation (HCC) 09/13/2016  . Alzheimer disease 09/13/2016  . Seizures (HCC) 09/13/2016  . Hypokalemia 09/13/2016  . Atrial fibrillation with RVR (HCC) 09/13/2016  . COPD exacerbation (HCC) 09/13/2016  . Hypotension 04/22/2012  . Coronary atherosclerosis of native coronary artery   . COPD (chronic obstructive pulmonary disease) (HCC)   . Mixed hyperlipidemia 05/14/2009  . Permanent atrial fibrillation (HCC) 02/10/2009  . DIASTOLIC HEART FAILURE, CHRONIC 02/10/2009    Orientation RESPIRATION BLADDER Height & Weight     Self  O2 (2L) Incontinent Weight: 141 lb 5 oz (64.1 kg) Height:   (165.1 cm)  BEHAVIORAL SYMPTOMS/MOOD NEUROLOGICAL BOWEL NUTRITION STATUS      Incontinent Diet (DIET DYS 2. patient must be alert and upright for all po. medications whole in puree. supervison with meals due to low vision. diet low sodium heart healthy)  AMBULATORY STATUS COMMUNICATION OF NEEDS Skin   Limited Assist Verbally                         Personal Care Assistance Level of Assistance  Bathing, Dressing, Feeding Bathing Assistance: Limited assistance Feeding assistance: Independent       Functional  Limitations Info  Sight, Hearing, Speech Sight Info: Impaired Hearing Info: Adequate Speech Info: Adequate    SPECIAL CARE FACTORS FREQUENCY  PT (By licensed PT)     PT Frequency: 3x/week              Contractures Contractures Info: Not present    Additional Factors Info  Code Status, Allergies Code Status Info: DNR Allergies Info: Ivp Dye, Omnipaque, Sulfa Antibiotics, Carbamazepine, Dilaudid, Tramadol           Current Medications (09/21/2016):  This is the current hospital active medication list Current Facility-Administered Medications  Medication Dose Route Frequency Provider Last Rate Last Dose  . 0.9 %  sodium chloride infusion   Intravenous Continuous Lorre Nick, MD 20 mL/hr at 09/13/16 [phone]    . 0.9 %  sodium chloride infusion   Intravenous Continuous Lorre Nick, MD 10 mL/hr at 09/19/16 0600    . acetaminophen (TYLENOL) tablet 650 mg  650 mg Oral Q6H PRN Bobette Mo, MD   650 mg at 09/17/16 1756  . acetaminophen (TYLENOL) tablet 650 mg  650 mg Oral TID Alba Cory, MD   650 mg at 09/21/16 0954  . atorvastatin (LIPITOR) tablet 40 mg  40 mg Oral q1800 Bobette Mo, MD   40 mg at 09/20/16 1733  . calcium-vitamin D (OSCAL WITH D) 500-200 MG-UNIT per tablet 1 tablet  1 tablet Oral BID Bobette Mo, MD   1 tablet at 09/21/16 [phone]  .  chlorhexidine (PERIDEX) 0.12 % solution 15 mL  15 mL Mouth Rinse BID Belkys A Regalado, MD   15 mL at 09/21/16 0955  . digoxin (LANOXIN) tablet 0.125 mg  0.125 mg Oral QPM Bobette Mo, MD   0.125 mg at 09/20/16 1733  . diltiazem (CARDIZEM) tablet 30 mg  30 mg Oral Q6H Belkys A Regalado, MD   30 mg at 09/21/16 0559  . feeding supplement (ENSURE ENLIVE) (ENSURE ENLIVE) liquid 237 mL  237 mL Oral BID BM Belkys A Regalado, MD   237 mL at 09/20/16 1444  . fluticasone (FLONASE) 50 MCG/ACT nasal spray 2 spray  2 spray Each Nare Daily Kari Baars, MD   2 spray at 09/21/16 0955  . guaiFENesin (MUCINEX) 12 hr  tablet 1,200 mg  1,200 mg Oral BID Kari Baars, MD   1,200 mg at 09/21/16 0954  . guaiFENesin-dextromethorphan (ROBITUSSIN DM) 100-10 MG/5ML syrup 5 mL  5 mL Oral Q4H PRN Belkys A Regalado, MD   5 mL at 09/18/16 1033  . ipratropium (ATROVENT) nebulizer solution 0.5 mg  0.5 mg Nebulization TID Belkys A Regalado, MD   0.5 mg at 09/21/16 0745  . levalbuterol (XOPENEX) nebulizer solution 0.63 mg  0.63 mg Nebulization Q6H PRN Bobette Mo, MD      . levalbuterol [facility]) nebulizer solution 0.63 mg  0.63 mg Nebulization TID Belkys A Regalado, MD   0.63 mg at 09/21/16 0745  . levETIRAcetam (KEPPRA) tablet 125 mg  125 mg Oral Q1500 Filbert Schilder, MD   125 mg at 09/20/16 1443  . levETIRAcetam (KEPPRA) tablet 250 mg  250 mg Oral q morning - 10a Filbert Schilder, MD   250 mg at 09/21/16 0954  . levothyroxine (SYNTHROID, LEVOTHROID) tablet 25 mcg  25 mcg Oral QAC breakfast Bobette Mo, MD   25 mcg at 09/21/16 [phone]  . loratadine (CLARITIN) tablet 10 mg  10 mg Oral Daily Bobette Mo, MD   10 mg at 09/21/16 0954  . MEDLINE mouth rinse  15 mL Mouth Rinse q12n4p Belkys A Regalado, MD   15 mL at 09/20/16 1600  . methylPREDNISolone sodium succinate (SOLU-MEDROL) 125 mg/2 mL injection 60 mg  60 mg Intravenous Q12H Belkys A Regalado, MD   60 mg at 09/21/16 0600  . midodrine (PROAMATINE) tablet 2.5 mg  2.5 mg Oral BID WC Bobette Mo, MD   2.5 mg at 09/21/16 0813  . morphine 2 MG/ML injection 0.5 mg  0.5 mg Intravenous Q3H PRN Belkys A Regalado, MD   0.5 mg at 09/20/16 2324  . multivitamin-lutein (OCUVITE-LUTEIN) capsule 1 capsule  1 capsule Oral BID Bobette Mo, MD   1 capsule at 09/21/16 0954  . nitroGLYCERIN (NITROSTAT) SL tablet 0.4 mg  0.4 mg Sublingual Q5 min PRN Bobette Mo, MD      . ondansetron [facility]) injection 4 mg  4 mg Intravenous Q6H PRN Levie Heritage, DO   4 mg at 09/16/16 2041  . pantoprazole (PROTONIX) injection 40 mg  40 mg Intravenous Q12H Belkys  A Regalado, MD   40 mg at 09/21/16 0954  . piperacillin-tazobactam (ZOSYN) IVPB 3.375 g  3.375 g Intravenous Q8H Belkys A Regalado, MD 12.5 mL/hr at 09/21/16 0600 3.375 g at 09/21/16 0600  . sodium chloride (OCEAN) 0.65 % nasal spray 1 spray  1 spray Each Nare PRN Kari Baars, MD         Discharge Medications: TAKE these medications   acetaminophen 500 MG  tablet Commonly known as:  TYLENOL Take 500 mg by mouth every 6 (six) hours as needed for mild pain. For pain   albuterol (2.5 MG/3ML) 0.083% nebulizer solution Commonly known as:  PROVENTIL Take 2.5 mg by nebulization 2 (two) times daily as needed.   alendronate 70 MG tablet Commonly known as:  FOSAMAX Take 70 mg by mouth once a week. Take with a full glass of water on an empty stomach.   aspirin 81 MG EC tablet Commonly known as:  aspirin EC Take 1 tablet (81 mg total) by mouth daily. Swallow whole.   atorvastatin 40 MG tablet Commonly known as:  LIPITOR Take 40 mg by mouth. Monday, Wednesday, & Friday evening.   CALTRATE 600+D PLUS 600-400 MG-UNIT per tablet Take 1 tablet by mouth 2 (two) times daily.   digoxin 0.125 MG tablet Commonly known as:  LANOXIN Take 1 tablet (0.125 mg total) by mouth every evening. What changed:  medication strength  how much to take   diltiazem 30 MG tablet Commonly known as:  CARDIZEM Take 1 tablet (30 mg total) by mouth every 6 (six) hours. What changed:  how much to take  when to take this   docusate sodium 100 MG capsule Commonly known as:  COLACE Take 100 mg by mouth at bedtime.   furosemide 40 MG tablet Commonly known as:  LASIX Take 10 mg by mouth 2 (two) times daily.   guaiFENesin 600 MG 12 hr tablet Commonly known as:  MUCINEX Take 2 tablets (1,200 mg total) by mouth 2 (two) times daily.   ICAPS Caps Take 1 capsule by mouth 2 (two) times daily.   levETIRAcetam 250 MG tablet Commonly known as:  KEPPRA Takes one tablet in the morning and half a  tablet at 3 pm.   levofloxacin 500 MG tablet Commonly known as:  LEVAQUIN Take 1 tablet (500 mg total) by mouth daily.   levothyroxine 25 MCG tablet Commonly known as:  SYNTHROID, LEVOTHROID Take 25 mcg by mouth daily before breakfast.   midodrine 2.5 MG tablet Commonly known as:  PROAMATINE Take 1 tablet (2.5 mg total) by mouth 2 (two) times daily with a meal. The dosing can be titrated down to once daily when her systolic blood pressure is consistently in the 100s.   nitroGLYCERIN 0.4 MG SL tablet Commonly known as:  NITROSTAT Place 0.4 mg under the tongue every 5 (five) minutes as needed for chest pain.   omeprazole 20 MG tablet Commonly known as:  PRILOSEC OTC Take 20 mg by mouth. Monday, Wednesday, & Friday evening.   potassium chloride 10 MEQ CR tablet Commonly known as:  KLOR-CON Take 10 mEq by mouth daily.   predniSONE 10 MG tablet Commonly known as:  DELTASONE Take 1 tablet (10 mg total) by mouth daily with breakfast. Take 6 tablets today and then decrease by 1 tablet daily until none are left. What changed:  how much to take  when to take this  additional instructions   PROBIOTIC DAILY PO Take 1 tablet by mouth every morning.        Relevant Imaging Results:  Relevant Lab Results:   Additional Information    Jonella Redditt, Juleen China, LCSW

## 2016-09-21 NOTE — Progress Notes (Addendum)
Progress Note  Patient Name: Deborah [name] Date of Encounter: 09/21/2016  Primary Cardiologist: Dr. Diona Browner  Subjective   Coughing. Still short of breath  Inpatient Medications    Scheduled Meds: . acetaminophen  650 mg Oral TID  . atorvastatin  40 mg Oral q1800  . calcium-vitamin D  1 tablet Oral BID  . chlorhexidine  15 mL Mouth Rinse BID  . digoxin  0.125 mg Oral QPM  . diltiazem  30 mg Oral Q6H  . feeding supplement (ENSURE ENLIVE)  237 mL Oral BID BM  . fluticasone  2 spray Each Nare Daily  . guaiFENesin  1,200 mg Oral BID  . ipratropium  0.5 mg Nebulization TID  . levalbuterol  0.63 mg Nebulization TID  . levETIRAcetam  125 mg Oral Q1500  . levETIRAcetam  250 mg Oral q morning - 10a  . levothyroxine  25 mcg Oral QAC breakfast  . loratadine  10 mg Oral Daily  . mouth rinse  15 mL Mouth Rinse q12n4p  . methylPREDNISolone (SOLU-MEDROL) injection  60 mg Intravenous Q12H  . midodrine  2.5 mg Oral BID WC  . multivitamin-lutein  1 capsule Oral BID  . pantoprazole (PROTONIX) IV  40 mg Intravenous Q12H   Continuous Infusions: . sodium chloride 20 mL/hr at 09/13/16 0648  . sodium chloride 10 mL/hr at 09/19/16 0600  . piperacillin-tazobactam (ZOSYN)  IV 3.375 g (09/21/16 0600)   PRN Meds: acetaminophen, guaiFENesin-dextromethorphan, levalbuterol, morphine injection, nitroGLYCERIN, ondansetron (ZOFRAN) IV, sodium chloride   Vital Signs    Vitals:   09/21/16 0700 09/21/16 0719 09/21/16 0749 09/21/16 0800  BP: 99/71   111/86  Pulse: 100 76  93  Resp: 15 (!) 23  (!) 22  Temp:  97.7 F (36.5 C)    TempSrc:  Oral    SpO2: 93% 93% 93% 92%  Weight:      Height:        Intake/Output Summary (Last 24 hours) at 09/21/16 0825 Last data filed at 09/20/16 2230  Gross per 24 hour  Intake              600 ml  Output                0 ml  Net              600 ml   Filed Weights   09/18/16 0500 09/19/16 0500 09/20/16 0500  Weight: 140 lb 14 oz (63.9 kg) 140 lb 14 oz  (63.9 kg) 141 lb 5 oz (64.1 kg)    Telemetry    Afib  100-130/m - Personally Reviewed  ECG      Physical Exam   GEN: No acute distress.   Neck: No JVD Cardiac: irreg 2-3/6 sys murmur apex Respiratory: Diffuse rhonchi, rales at bases GI: Soft, nontender, non-distended  MS: No edema; No deformity. Neuro:  Nonfocal  Psych: Normal affect   Labs    Chemistry Recent Labs Lab 09/19/16 0628 09/20/16 0520 09/21/16 0452  NA 132* 133* 134*  K 3.4* 3.1* 3.8  CL 89* 91* 96*  CO2 33* 33* 30  GLUCOSE 115* 139* 131*  BUN 27* 25* 27*  CREATININE 0.56 0.51 0.52  CALCIUM 8.8* 8.1* 8.3*  GFRNONAA >60 >60 >60  GFRAA >60 >60 >60  ANIONGAP Hematology Recent Labs Lab 09/19/16 0628 09/20/16 0520 09/21/16 0452  WBC 20.2* 18.4* 21.2*  RBC 5.40* 4.93 4.83  HGB 15.6* 14.2 13.7  HCT 46.9* 42.4 42.1  MCV 86.9 86.0 87.2  MCH 28.9 28.8 28.4  MCHC 33.3 33.5 32.5  RDW 14.2 14.1 14.3  PLT 198 179 162    Cardiac EnzymesNo results for input(s): TROPONINI in the last 168 hours. No results for input(s): TROPIPOC in the last 168 hours.   BNP Recent Labs Lab 09/16/16 1924  BNP 217.0*     DDimer No results for input(s): DDIMER in the last 168 hours.   Radiology    No results found.  Cardiac Studies   Study Conclusions  - Left ventricle: The cavity size was normal. There was mild   concentric hypertrophy. Systolic function was normal. The   estimated ejection fraction was in the range of 60% to   65%. Wall motion was normal; there were no regional wall   motion abnormalities. - Aortic valve: Mildly calcified annulus. Mildly calcified   leaflets. - Mitral valve: Calcified annulus. - Left atrium: The atrium was moderately dilated. - Right atrium: The atrium was mildly to moderately dilated. - Atrial septum: No defect or patent foramen ovale was   identified. Transthoracic echocardiography.  M-mode, complete 2D, spectral Doppler, and color Doppler.  Height:   Height: 162.6cm. Height: 64in.  Weight:  Weight: 63.5kg. Weight: 139.7lb.  Body mass index:  BMI: 24kg/m^2.  Body surface area:    BSA: 1.6m^2.  Patient status:  Inpatient. Location:  ICU/CCU   Patient Profile     83 y.o. female with a history of chronic atrial fibrillation (not anticoagulated due to fall risk and prior history of hemorrhage), chronic hypotension requiring midodrine, previous intolerance to amiodarone and flecainide, also symptomatically stable CAD. She is currently admitted to the hospital with respiratory failure in the setting of COPD exacerbation. Atrial fibrillation heart rate has been elevated in this setting.   Assessment & Plan    1. Chronic atrial fibrillation with recent RVR in the setting of COPD exacerbation.  HR's better controlled today but still fast. Clinically she has improved. On diltiazem 30 mg q 6 digoinx 0.125 mg daily. Not an anticoagulation candidate. Intolerant of amiodarone and flecainide per documentation. Hopefully will come down with improvement of lungs.    2. COPD exacerbation. Improving on IV steroids, antibiotics (Zosyn), and bronchodilators. This is driving rapid atrial fibrillation.   3. Chronic hypotension, on midodrine. BP normal.      Signed, Jacolyn Reedy, PA-C  09/21/2016, 8:25 AM    The patient was seen and examined, and I agree with the history, physical exam, assessment and plan as documented above, with modifications as noted below. She is stable from a pulmonary standpoint. HR remains elevated at times and being driven by underlying pulmonary pathology and treatments. Continue oral diltiazem and digoxin for now.   Prentice Docker, MD, [facility]  09/21/2016 9:25 AM

## 2016-09-21 NOTE — Care Management (Signed)
    Durable Medical Equipment        Start     Ordered   09/21/16 1052  For home use only DME Walker rolling  Once    Question:  Patient needs a walker to treat with the following condition  Answer:  Weakness   09/21/16 1052

## 2016-12-04 ENCOUNTER — Encounter (HOSPITAL_COMMUNITY): Payer: Self-pay | Admitting: Emergency Medicine

## 2016-12-04 ENCOUNTER — Emergency Department (HOSPITAL_COMMUNITY): Payer: Medicare Other

## 2016-12-04 ENCOUNTER — Inpatient Hospital Stay (HOSPITAL_COMMUNITY)
Admission: EM | Admit: 2016-12-04 | Discharge: 2016-12-06 | DRG: 309 | Disposition: A | Discharge status: Nursing Home (Includes ICF) | Payer: Medicare Other | Attending: Internal Medicine | Admitting: Internal Medicine

## 2016-12-04 DIAGNOSIS — Z87891 Personal history of nicotine dependence: Secondary | ICD-10-CM | POA: Diagnosis not present

## 2016-12-04 DIAGNOSIS — I5032 Chronic diastolic (congestive) heart failure: Secondary | ICD-10-CM | POA: Diagnosis present

## 2016-12-04 DIAGNOSIS — I959 Hypotension, unspecified: Secondary | ICD-10-CM | POA: Diagnosis present

## 2016-12-04 DIAGNOSIS — J449 Chronic obstructive pulmonary disease, unspecified: Secondary | ICD-10-CM | POA: Diagnosis present

## 2016-12-04 DIAGNOSIS — Z9181 History of falling: Secondary | ICD-10-CM | POA: Diagnosis not present

## 2016-12-04 DIAGNOSIS — I482 Chronic atrial fibrillation: Principal | ICD-10-CM | POA: Diagnosis present

## 2016-12-04 DIAGNOSIS — Z885 Allergy status to narcotic agent status: Secondary | ICD-10-CM | POA: Diagnosis not present

## 2016-12-04 DIAGNOSIS — Z882 Allergy status to sulfonamides status: Secondary | ICD-10-CM | POA: Diagnosis not present

## 2016-12-04 DIAGNOSIS — G308 Other Alzheimer's disease: Secondary | ICD-10-CM | POA: Diagnosis not present

## 2016-12-04 DIAGNOSIS — Z66 Do not resuscitate: Secondary | ICD-10-CM | POA: Diagnosis present

## 2016-12-04 DIAGNOSIS — F05 Delirium due to known physiological condition: Secondary | ICD-10-CM | POA: Diagnosis present

## 2016-12-04 DIAGNOSIS — I2510 Atherosclerotic heart disease of native coronary artery without angina pectoris: Secondary | ICD-10-CM | POA: Diagnosis present

## 2016-12-04 DIAGNOSIS — Z91041 Radiographic dye allergy status: Secondary | ICD-10-CM

## 2016-12-04 DIAGNOSIS — F0280 Dementia in other diseases classified elsewhere without behavioral disturbance: Secondary | ICD-10-CM | POA: Diagnosis not present

## 2016-12-04 DIAGNOSIS — F0281 Dementia in other diseases classified elsewhere with behavioral disturbance: Secondary | ICD-10-CM

## 2016-12-04 DIAGNOSIS — G309 Alzheimer's disease, unspecified: Secondary | ICD-10-CM | POA: Diagnosis present

## 2016-12-04 DIAGNOSIS — G40909 Epilepsy, unspecified, not intractable, without status epilepticus: Secondary | ICD-10-CM | POA: Diagnosis present

## 2016-12-04 DIAGNOSIS — Z955 Presence of coronary angioplasty implant and graft: Secondary | ICD-10-CM | POA: Diagnosis not present

## 2016-12-04 DIAGNOSIS — E782 Mixed hyperlipidemia: Secondary | ICD-10-CM | POA: Diagnosis present

## 2016-12-04 DIAGNOSIS — Z7982 Long term (current) use of aspirin: Secondary | ICD-10-CM

## 2016-12-04 DIAGNOSIS — Z79899 Other long term (current) drug therapy: Secondary | ICD-10-CM | POA: Diagnosis not present

## 2016-12-04 DIAGNOSIS — Z888 Allergy status to other drugs, medicaments and biological substances status: Secondary | ICD-10-CM | POA: Diagnosis not present

## 2016-12-04 DIAGNOSIS — R0902 Hypoxemia: Secondary | ICD-10-CM

## 2016-12-04 DIAGNOSIS — I4891 Unspecified atrial fibrillation: Secondary | ICD-10-CM

## 2016-12-04 DIAGNOSIS — I5033 Acute on chronic diastolic (congestive) heart failure: Secondary | ICD-10-CM | POA: Diagnosis present

## 2016-12-04 DIAGNOSIS — R569 Unspecified convulsions: Secondary | ICD-10-CM

## 2016-12-04 DIAGNOSIS — J441 Chronic obstructive pulmonary disease with (acute) exacerbation: Secondary | ICD-10-CM

## 2016-12-04 LAB — BASIC METABOLIC PANEL
ANION GAP: 12 (ref 5–15)
BUN: 10 mg/dL (ref 6–20)
CO2: 28 mmol/L (ref 22–32)
Calcium: 9.3 mg/dL (ref 8.9–10.3)
Chloride: 101 mmol/L (ref 101–111)
Creatinine, Ser: 0.48 mg/dL (ref 0.44–1.00)
Glucose, Bld: 129 mg/dL — ABNORMAL HIGH (ref 65–99)
POTASSIUM: 4 mmol/L (ref 3.5–5.1)
SODIUM: 141 mmol/L (ref 135–145)

## 2016-12-04 LAB — CBC WITH DIFFERENTIAL/PLATELET
BASOS PCT: 0 %
Basophils Absolute: 0 10*3/uL (ref 0.0–0.1)
EOS ABS: 0 10*3/uL (ref 0.0–0.7)
Eosinophils Relative: 0 %
HCT: 45.5 % (ref 36.0–46.0)
Hemoglobin: 14.6 g/dL (ref 12.0–15.0)
Lymphocytes Relative: 2 %
Lymphs Abs: 0.4 10*3/uL — ABNORMAL LOW (ref 0.7–4.0)
MCH: 29 pg (ref 26.0–34.0)
MCHC: 32.1 g/dL (ref 30.0–36.0)
MCV: 90.3 fL (ref 78.0–100.0)
MONO ABS: 1.2 10*3/uL — AB (ref 0.1–1.0)
Monocytes Relative: 6 %
NEUTROS PCT: 92 %
Neutro Abs: 18.8 10*3/uL — ABNORMAL HIGH (ref 1.7–7.7)
PLATELETS: 182 10*3/uL (ref 150–400)
RBC: 5.04 MIL/uL (ref 3.87–5.11)
RDW: 15.2 % (ref 11.5–15.5)
WBC: 20.4 10*3/uL — AB (ref 4.0–10.5)

## 2016-12-04 LAB — TROPONIN I
Troponin I: <0.03 ng/mL (ref <0.03)
Troponin I: <0.03 ng/mL (ref <0.03)

## 2016-12-04 LAB — MRSA PCR SCREENING: MRSA by PCR: NEGATIVE

## 2016-12-04 LAB — MAGNESIUM: MAGNESIUM: 1.9 mg/dL (ref 1.7–2.4)

## 2016-12-04 LAB — BRAIN NATRIURETIC PEPTIDE: B NATRIURETIC PEPTIDE 5: 135 pg/mL — AB (ref 0.0–100.0)

## 2016-12-04 LAB — TSH: TSH: 1.276 u[IU]/mL (ref 0.350–4.500)

## 2016-12-04 LAB — DIGOXIN LEVEL: Digoxin Level: 0.7 ng/mL — ABNORMAL LOW (ref 0.8–2.0)

## 2016-12-04 MED ORDER — ACETAMINOPHEN 650 MG RE SUPP: 650.0000 mg | Freq: Four times a day (QID) | RECTAL | Status: DC | PRN

## 2016-12-04 MED ORDER — ACETAMINOPHEN 325 MG PO TABS: 650.0000 mg | ORAL_TABLET | Freq: Four times a day (QID) | ORAL | Status: DC | PRN

## 2016-12-04 MED ORDER — ATORVASTATIN CALCIUM 40 MG PO TABS
40.0000 mg | ORAL_TABLET | Freq: Every day | ORAL | Status: DC
  Administered 2016-12-05: 40 mg via ORAL
  Filled 2016-12-04: qty 1

## 2016-12-04 MED ORDER — ALBUTEROL SULFATE (2.5 MG/3ML) 0.083% IN NEBU
5.0000 mg | INHALATION_SOLUTION | Freq: Once | RESPIRATORY_TRACT | Status: AC
  Administered 2016-12-04: 5 mg via RESPIRATORY_TRACT
  Filled 2016-12-04: qty 6

## 2016-12-04 MED ORDER — POTASSIUM CHLORIDE CRYS ER 10 MEQ PO TBCR
10.0000 meq | EXTENDED_RELEASE_TABLET | Freq: Every day | ORAL | Status: DC
  Administered 2016-12-05 – 2016-12-06 (×2): 10 meq via ORAL
  Filled 2016-12-04 (×2): qty 1

## 2016-12-04 MED ORDER — CALCIUM CARBONATE-VITAMIN D 500-200 MG-UNIT PO TABS
1.0000 | ORAL_TABLET | Freq: Two times a day (BID) | ORAL | Status: DC
  Administered 2016-12-04 – 2016-12-06 (×3): 1 via ORAL
  Filled 2016-12-04 (×7): qty 1

## 2016-12-04 MED ORDER — LEVOTHYROXINE SODIUM 25 MCG PO TABS
25.0000 ug | ORAL_TABLET | Freq: Every day | ORAL | Status: DC
  Administered 2016-12-05 – 2016-12-06 (×2): 25 ug via ORAL
  Filled 2016-12-04 (×2): qty 1

## 2016-12-04 MED ORDER — IPRATROPIUM-ALBUTEROL 0.5-2.5 (3) MG/3ML IN SOLN
RESPIRATORY_TRACT | Status: AC
  Administered 2016-12-04: 3 mL via RESPIRATORY_TRACT
  Filled 2016-12-04: qty 3

## 2016-12-04 MED ORDER — ALBUTEROL SULFATE (2.5 MG/3ML) 0.083% IN NEBU: 2.5000 mg | INHALATION_SOLUTION | Freq: Once | RESPIRATORY_TRACT | Status: DC

## 2016-12-04 MED ORDER — ADULT MULTIVITAMIN W/MINERALS CH
1.0000 | ORAL_TABLET | Freq: Every day | ORAL | Status: DC
  Administered 2016-12-04 – 2016-12-06 (×3): 1 via ORAL
  Filled 2016-12-04 (×3): qty 1

## 2016-12-04 MED ORDER — LEVETIRACETAM 250 MG PO TABS
250.0000 mg | ORAL_TABLET | Freq: Two times a day (BID) | ORAL | Status: DC
  Administered 2016-12-04 – 2016-12-06 (×3): 250 mg via ORAL
  Filled 2016-12-04 (×4): qty 1

## 2016-12-04 MED ORDER — ALBUTEROL SULFATE (2.5 MG/3ML) 0.083% IN NEBU
INHALATION_SOLUTION | RESPIRATORY_TRACT | Status: AC
  Administered 2016-12-04: 2.5 mg
  Filled 2016-12-04: qty 3

## 2016-12-04 MED ORDER — IPRATROPIUM-ALBUTEROL 0.5-2.5 (3) MG/3ML IN SOLN
3.0000 mL | Freq: Once | RESPIRATORY_TRACT | Status: AC
  Administered 2016-12-04: 3 mL via RESPIRATORY_TRACT

## 2016-12-04 MED ORDER — METHYLPREDNISOLONE SODIUM SUCC 125 MG IJ SOLR
125.0000 mg | Freq: Once | INTRAMUSCULAR | Status: AC
  Administered 2016-12-04: 125 mg via INTRAVENOUS
  Filled 2016-12-04: qty 2

## 2016-12-04 MED ORDER — OMEPRAZOLE MAGNESIUM 20 MG PO TBEC: 20.0000 mg | DELAYED_RELEASE_TABLET | ORAL | Status: DC

## 2016-12-04 MED ORDER — FUROSEMIDE 20 MG PO TABS
10.0000 mg | ORAL_TABLET | Freq: Two times a day (BID) | ORAL | Status: DC
  Administered 2016-12-04 – 2016-12-06 (×4): 10 mg via ORAL
  Filled 2016-12-04 (×4): qty 1

## 2016-12-04 MED ORDER — ENOXAPARIN SODIUM 40 MG/0.4ML ~~LOC~~ SOLN
40.0000 mg | SUBCUTANEOUS | Status: DC
  Administered 2016-12-04: 40 mg via SUBCUTANEOUS
  Filled 2016-12-04 (×2): qty 0.4

## 2016-12-04 MED ORDER — DOCUSATE SODIUM 100 MG PO CAPS
100.0000 mg | ORAL_CAPSULE | Freq: Every day | ORAL | Status: DC
  Administered 2016-12-04: 100 mg via ORAL
  Filled 2016-12-04 (×2): qty 1

## 2016-12-04 MED ORDER — IPRATROPIUM-ALBUTEROL 0.5-2.5 (3) MG/3ML IN SOLN
3.0000 mL | Freq: Once | RESPIRATORY_TRACT | Status: AC
  Administered 2016-12-04: 3 mL via RESPIRATORY_TRACT
  Filled 2016-12-04: qty 3

## 2016-12-04 MED ORDER — DILTIAZEM HCL 100 MG IV SOLR
5.0000 mg/h | INTRAVENOUS | Status: DC
  Administered 2016-12-04 (×2): 5 mg/h via INTRAVENOUS
  Filled 2016-12-04: qty 100

## 2016-12-04 MED ORDER — DILTIAZEM LOAD VIA INFUSION
10.0000 mg | Freq: Once | INTRAVENOUS | Status: AC
  Administered 2016-12-04: 10 mg via INTRAVENOUS
  Filled 2016-12-04: qty 10

## 2016-12-04 MED ORDER — ONDANSETRON HCL 4 MG/2ML IJ SOLN: 4.0000 mg | Freq: Four times a day (QID) | INTRAMUSCULAR | Status: DC | PRN

## 2016-12-04 MED ORDER — ASPIRIN EC 81 MG PO TBEC
81.0000 mg | DELAYED_RELEASE_TABLET | Freq: Every day | ORAL | Status: DC
  Administered 2016-12-04: 81 mg via ORAL
  Filled 2016-12-04 (×3): qty 1

## 2016-12-04 MED ORDER — DIGOXIN 125 MCG PO TABS
0.1250 mg | ORAL_TABLET | Freq: Every evening | ORAL | Status: DC
  Administered 2016-12-04 – 2016-12-05 (×2): 0.125 mg via ORAL
  Filled 2016-12-04 (×2): qty 1

## 2016-12-04 MED ORDER — SENNOSIDES-DOCUSATE SODIUM 8.6-50 MG PO TABS: 1.0000 | ORAL_TABLET | Freq: Every evening | ORAL | Status: DC | PRN

## 2016-12-04 MED ORDER — ICAPS PO CAPS: 1.0000 | ORAL_CAPSULE | Freq: Two times a day (BID) | ORAL | Status: DC

## 2016-12-04 MED ORDER — SODIUM CHLORIDE 0.9 % IV SOLN: 250.0000 mL | INTRAVENOUS | Status: DC | PRN

## 2016-12-04 MED ORDER — SODIUM CHLORIDE 0.9% FLUSH
3.0000 mL | Freq: Two times a day (BID) | INTRAVENOUS | Status: DC
  Administered 2016-12-04 – 2016-12-06 (×2): 3 mL via INTRAVENOUS

## 2016-12-04 MED ORDER — ONDANSETRON HCL 4 MG PO TABS: 4.0000 mg | ORAL_TABLET | Freq: Four times a day (QID) | ORAL | Status: DC | PRN

## 2016-12-04 MED ORDER — ALBUTEROL SULFATE (2.5 MG/3ML) 0.083% IN NEBU
3.0000 mL | INHALATION_SOLUTION | Freq: Four times a day (QID) | RESPIRATORY_TRACT | Status: DC | PRN
  Administered 2016-12-05: 3 mL via RESPIRATORY_TRACT
  Filled 2016-12-04: qty 3

## 2016-12-04 MED ORDER — DILTIAZEM HCL 100 MG IV SOLR
5.0000 mg/h | INTRAVENOUS | Status: DC
  Administered 2016-12-04: 10 mg/h via INTRAVENOUS
  Filled 2016-12-04: qty 100

## 2016-12-04 MED ORDER — PANTOPRAZOLE SODIUM 40 MG PO TBEC
40.0000 mg | DELAYED_RELEASE_TABLET | Freq: Every day | ORAL | Status: DC
  Administered 2016-12-05 – 2016-12-06 (×2): 40 mg via ORAL
  Filled 2016-12-04 (×2): qty 1

## 2016-12-04 MED ORDER — MIDODRINE HCL 5 MG PO TABS
2.5000 mg | ORAL_TABLET | Freq: Two times a day (BID) | ORAL | Status: DC
Start: 2016-12-05 — End: 2016-12-06
  Administered 2016-12-05 – 2016-12-06 (×3): 2.5 mg via ORAL
  Filled 2016-12-04 (×3): qty 1

## 2016-12-04 MED ORDER — CALTRATE 600+D PLUS 600-400 MG-UNIT PO TABS: 1.0000 | ORAL_TABLET | Freq: Two times a day (BID) | ORAL | Status: DC

## 2016-12-04 MED ORDER — SODIUM CHLORIDE 0.9% FLUSH
3.0000 mL | INTRAVENOUS | Status: DC | PRN
Start: 2016-12-04 — End: 2016-12-06

## 2016-12-04 MED ORDER — NITROGLYCERIN 0.4 MG SL SUBL: 0.4000 mg | SUBLINGUAL_TABLET | SUBLINGUAL | Status: DC | PRN

## 2016-12-04 NOTE — ED Notes (Signed)
Pt was 81% on RA upon arrival.

## 2016-12-04 NOTE — H&P (Signed)
History and Physical    Deborah [name]:[id number] DOB: 1925/09/22 DOA: 12/04/2016  Referring MD/NP/PA: Samuel Jester, EDP PCP: Ignatius Specking, MD  Patient coming from: [facility]  Chief Complaint: Shortness of breath, cough  HPI: Deborah [name] is a 83 y.o. female with history of Alzheimer's dementia, permanent A. fib not on anticoagulation due to high risk falls, chronic diastolic CHF who presents to the hospital today with shortness of breath. History is very limited given the condition of the patient and her history of dementia. All she can tell me is that she has been coughing and felt short of breath and I'm assuming this is why she was sent to the hospital today for evaluation. In the ED she was noticed to be in A. fib with RVR with a heart rate in the upper 140s, labs are unremarkable with the exception of a WBC count of 20.4, chest x-ray without edema or other acute issues. Admission has been requested for management of A. fib with RVR.  Past Medical/Surgical History: Past Medical History:  Diagnosis Date  . Alzheimer disease   . Asthma   . Atrial fibrillation (HCC)    Permanent  . Chronic anticoagulation    Xarelto discontinued after fall  . Chronic diastolic heart failure (HCC)   . COPD (chronic obstructive pulmonary disease) (HCC)   . Coronary atherosclerosis of native coronary artery    BMS circumflex 2009  . Drug intolerance    Flecacinide and Amiodarone   . Fracture of multiple pubic rami (HCC) 12/13   Left superior and inferior - following fall  . Hypotension   . Mixed hyperlipidemia   . Retroperitoneal hemorrhage 12/13   Following fall  . Seizures (HCC)    Remote history of over 20 years ago  . Umbilical hernia     Past Surgical History:  Procedure Laterality Date  . CATARACT EXTRACTION    . Radiofrequency catheter ablation     Failed  . Right carpel tunnel release    . TONSILLECTOMY      Social History:  reports that she quit smoking about 26  years ago. Her smoking use included Cigarettes. She has a 32.00 pack-year smoking history. She has never used smokeless tobacco. She reports that she does not drink alcohol or use drugs.  Allergies: Allergies  Allergen Reactions  . Ivp Dye [Iodinated Diagnostic Agents] Shortness Of Breath    ??  . Omnipaque [Iohexol] Shortness Of Breath and Other (See Comments)    Short of breath with chest tightness after IV injection in CT, pt was fine when she left department but developed symptoms in parking lot and went to emergency department.  . Sulfa Antibiotics Shortness Of Breath  . Carbamazepine     REACTION: toxemia  . Dilaudid [Hydromorphone] Nausea And Vomiting  . Tramadol Other (See Comments)    confusion    Family History:  Family History  Problem Relation Age of Onset  . Coronary artery disease Mother     Prior to Admission medications   Medication Sig Start Date End Date Taking? Authorizing Provider  acetaminophen (TYLENOL) 500 MG tablet Take 500 mg by mouth every 6 (six) hours as needed for mild pain. For pain    Yes [provider]  albuterol (PROVENTIL HFA;VENTOLIN HFA) 108 (90 Base) MCG/ACT inhaler Inhale 2 puffs into the lungs every 6 (six) hours as needed for wheezing or shortness of breath.   Yes [provider]  aspirin (ASPIRIN EC) 81 MG EC  tablet Take 1 tablet (81 mg total) by mouth daily. Swallow whole. 05/04/12  Yes [name], MD  atorvastatin (LIPITOR) 40 MG tablet Take 40 mg by mouth. Monday, Wednesday, & Friday evening.   Yes [provider]  Calcium Carbonate-Vit D-Min (CALTRATE 600+D PLUS) 600-400 MG-UNIT per tablet Take 1 tablet by mouth 2 (two) times daily.     Yes [provider]  digoxin (LANOXIN) 0.125 MG tablet Take 1 tablet (0.125 mg total) by mouth every evening. 09/21/16  Yes [name], [name], MD  diltiazem (CARDIZEM) 30 MG tablet Take 1 tablet (30 mg total) by mouth every 6 (six) hours. 09/21/16  Yes [name], [name], MD  docusate sodium (COLACE) 100 MG capsule Take 100 mg by mouth at bedtime.   Yes [provider]  furosemide (LASIX) 20 MG tablet Take 10 mg by mouth 2 (two) times daily.    Yes [provider]  guaiFENesin (MUCINEX) 600 MG 12 hr tablet Take 2 tablets (1,200 mg total) by mouth 2 (two) times daily. 09/21/16  Yes [name], [name], MD  levETIRAcetam (KEPPRA) 250 MG tablet Takes one tablet in the morning and half a tablet at 3 pm.   Yes [provider]  levothyroxine (SYNTHROID, LEVOTHROID) 25 MCG tablet Take 25 mcg by mouth daily before breakfast.   Yes [provider]  midodrine (PROAMATINE) 2.5 MG tablet Take 1 tablet (2.5 mg total) by mouth 2 (two) times daily with a meal. The dosing can be titrated down to once daily when her systolic blood pressure is consistently in the 100s. 05/04/12  Yes [name], MD  Multiple Vitamins-Minerals (ICAPS) CAPS Take 1 capsule by mouth 2 (two) times daily.   Yes [provider]  nitroGLYCERIN (NITROSTAT) 0.4 MG SL tablet Place 0.4 mg under the tongue every 5 (five) minutes as needed for chest pain.   Yes [provider]  potassium chloride (KLOR-CON) 10 MEQ CR tablet Take 10 mEq by mouth daily.     Yes [provider]  Probiotic Product (PROBIOTIC DAILY PO) Take 1 tablet by mouth every morning.   Yes [provider]  Trolamine Salicylate (ASPERCREME) 10 % LOTN Apply 1 application topically daily as needed.   Yes [provider]  albuterol (PROVENTIL) (2.5 MG/3ML) 0.083% nebulizer solution Take 2.5 mg by nebulization 2 (two) times daily as needed.    [provider]  alendronate (FOSAMAX) 70 MG tablet Take 70 mg by mouth once a week. Take with a full glass of water on an empty stomach.    [provider]  omeprazole (PRILOSEC OTC) 20 MG tablet Take 20 mg by mouth. Monday, Wednesday, & Friday evening.    [provider]     Review of Systems:  Unable to obtain given her confusion   Physical Exam: Vitals:   12/04/16 1624 12/04/16 1646 12/04/16 1700 12/04/16 1731  BP: 124/62  113/73 119/63  Pulse: (!) 109  (!) 112 (!) 122  Resp: (!) 25  20 (!) 24  Temp:    99.3 F (37.4 C)  TempSrc:    Oral  SpO2: 95% 95% 98% 99%  Weight:    62.3 kg (137 lb 5.6 oz)  Height:    5\' 5"  (1.651 m)     Constitutional: NAD, calm, comfortable,Confused Eyes: PERRL, lids and conjunctivae normal ENMT: Mucous membranes are moist. Posterior pharynx clear of any exudate or lesions.Normal dentition.  Neck: normal, supple, no masses, no thyromegaly Respiratory: clear  to auscultation bilaterally, no wheezing, no crackles. Normal respiratory effort. No accessory muscle use.  Cardiovascular: Regular rate and rhythm, no murmurs / rubs / gallops. No extremity edema. 2+ pedal pulses. No carotid bruits.  Abdomen: no tenderness, no masses palpated. No hepatosplenomegaly. Bowel sounds positive.  Musculoskeletal: no clubbing / cyanosis. No joint deformity upper and lower extremities. Good ROM, no contractures. Normal muscle tone.  Skin: no rashes, lesions, ulcers. No induration Neurologic: CN 2-12 grossly intact. Sensation intact, DTR normal. Strength 5/5 in all 4.    Labs on Admission: I have personally reviewed the following labs and imaging studies  CBC:  Recent Labs Lab 12/04/16 1519  WBC 20.4*  NEUTROABS 18.8*  HGB 14.6  HCT 45.5  MCV 90.3  PLT 182   Basic Metabolic Panel:  Recent Labs Lab 12/04/16 1519  NA 141  K 4.0  CL 101  CO2 28  GLUCOSE 129*  BUN 10  CREATININE 0.48  CALCIUM 9.3   GFR: Estimated Creatinine Clearance: 43.7 mL/min (by C-G formula based on SCr of 0.48 mg/dL). Liver Function Tests: No results for input(s): AST, ALT, ALKPHOS, BILITOT, PROT, ALBUMIN in the last 168 hours. No results for input(s): LIPASE, AMYLASE in the last 168 hours. No results for input(s): AMMONIA in the last 168  hours. Coagulation Profile: No results for input(s): INR, PROTIME in the last 168 hours. Cardiac Enzymes:  Recent Labs Lab 12/04/16 1519  TROPONINI <0.03   BNP (last 3 results) No results for input(s): PROBNP in the last 8760 hours. HbA1C: No results for input(s): HGBA1C in the last 72 hours. CBG: No results for input(s): GLUCAP in the last 168 hours. Lipid Profile: No results for input(s): CHOL, HDL, LDLCALC, TRIG, CHOLHDL, LDLDIRECT in the last 72 hours. Thyroid Function Tests: No results for input(s): TSH, T4TOTAL, FREET4, T3FREE, THYROIDAB in the last 72 hours. Anemia Panel: No results for input(s): VITAMINB12, FOLATE, FERRITIN, TIBC, IRON, RETICCTPCT in the last 72 hours. Urine analysis:    Component Value Date/Time   COLORURINE YELLOW 04/28/2012 1830   APPEARANCEUR CLEAR 04/28/2012 1830   LABSPEC 1.010 04/28/2012 1830   PHURINE 6.5 04/28/2012 1830   GLUCOSEU NEGATIVE 04/28/2012 1830   HGBUR TRACE (A) 04/28/2012 1830   BILIRUBINUR NEGATIVE 04/28/2012 1830   KETONESUR NEGATIVE 04/28/2012 1830   PROTEINUR NEGATIVE 04/28/2012 1830   UROBILINOGEN 0.2 04/28/2012 1830   NITRITE NEGATIVE 04/28/2012 1830   LEUKOCYTESUR NEGATIVE 04/28/2012 1830   Sepsis Labs: @LABRCNTIP (procalcitonin:4,lacticidven:4) )No results found for this or any previous visit (from the past 240 hour(s)).   Radiological Exams on Admission: Dg Chest Port 1 View  Result Date: 12/04/2016 CLINICAL DATA:  Dyspnea EXAM: PORTABLE CHEST 1 VIEW COMPARISON:  09/17/2016 chest radiograph. FINDINGS: Stable cardiomediastinal silhouette with mild cardiomegaly and aortic atherosclerosis. No pneumothorax. No pleural effusion. No overt pulmonary edema. No acute consolidative airspace disease. IMPRESSION: Stable mild cardiomegaly without pulmonary edema. No acute pulmonary disease. Electronically Signed   By: Delbert PhenixJason A Poff M.D.   On: 12/04/2016 15:54    EKG: Independently reviewed. A. fib with RVR and a rate of  149  Assessment/Plan Principal Problem:   Atrial fibrillation with RVR (HCC) Active Problems:   Mixed hyperlipidemia   DIASTOLIC HEART FAILURE, CHRONIC   Hypotension   Alzheimer disease   Seizures (HCC)    Atrial fibrillation with rapid ventricular response -Was given Cardizem bolus and started on a Cardizem drip in the emergency department which I will continue tonight. -Continue digoxin, consider switching calcium channel blocker to  oral in a.m. -She is not anticoagulated due to her prior history of falls, she is also high fall risk given her dementia. -Will update 2-D echo as she has not had one since 2013.  Alzheimer's dementia -Moderate at least, currently no behavioral disturbances.  Hyperlipidemia -Continue statin  Seizure disorder -Continue Keppra  Chronic diastolic CHF -Does not appear to be exacerbated at present despite rapid A. fib.   DVT prophylaxis: Lovenox  Code Status: Full code  Family Communication: Patient only  Disposition Plan: Admit to stepdown unit for Cardizem drip  Consults called: None  Admission status: Inpatient    Time Spent: 75 minutes   Chaya Jan MD [facility] Pager [phone]  If 7PM-7AM, please contact night-coverage [email or web address] Password [facility]  12/04/2016, 7:14 PM

## 2016-12-04 NOTE — ED Notes (Signed)
Pt.'s HR is between 109-117. BP is 100/55. Am leaving Cardizem at 5 at this time. Will notify Dr. Clarene DukeMcManus

## 2016-12-04 NOTE — ED Triage Notes (Signed)
Pt c/o sob, wheezing and back pain since this am.

## 2016-12-04 NOTE — ED Notes (Signed)
Respiratory in room.

## 2016-12-04 NOTE — ED Provider Notes (Signed)
[facility]EMER[facility]ENCY DEPT Provider Note   CSN: [id number] Arrival date & time: 12/04/16  1500     History   Chief Complaint Chief Complaint  Patient presents with  . Shortness of Breath    HPI Deborah [name] is a 83 y.o. female.  The history is provided by the patient and a relative. The history is limited by the condition of the patient (Hx dementia).  Shortness of Breath   Pt was seen at 1520. Per pt's family: Pt told facility staff this morning she felt SOB. Family states pt told them yesterday she had a runny nose. Pt was given neb without improvement. Pt has significant hx of dementia.    Past Medical History:  Diagnosis Date  . Alzheimer disease   . Asthma   . Atrial fibrillation (HCC)    Permanent  . Chronic anticoagulation    Xarelto discontinued after fall  . Chronic diastolic heart failure (HCC)   . COPD (chronic obstructive pulmonary disease) (HCC)   . Coronary atherosclerosis of native coronary artery    BMS circumflex 2009  . Drug intolerance    Flecacinide and Amiodarone   . Fracture of multiple pubic rami (HCC) 12/13   Left superior and inferior - following fall  . Hypotension   . Mixed hyperlipidemia   . Retroperitoneal hemorrhage 12/13   Following fall  . Seizures (HCC)    Remote history of over 20 years ago  . Umbilical hernia     Patient Active Problem List   Diagnosis Date Noted  . Hypoxemia   . COPD with acute exacerbation (HCC) 09/13/2016  . Alzheimer disease 09/13/2016  . Seizures (HCC) 09/13/2016  . Hypokalemia 09/13/2016  . Atrial fibrillation with RVR (HCC) 09/13/2016  . COPD exacerbation (HCC) 09/13/2016  . Hypotension 04/22/2012  . Coronary atherosclerosis of native coronary artery   . COPD (chronic obstructive pulmonary disease) (HCC)   . Mixed hyperlipidemia 05/14/2009  . Permanent atrial fibrillation (HCC) 02/10/2009  . DIASTOLIC HEART FAILURE, CHRONIC 02/10/2009    Past Surgical History:  Procedure Laterality Date  .  CATARACT EXTRACTION    . Radiofrequency catheter ablation     Failed  . Right carpel tunnel release    . TONSILLECTOMY      OB History    No data available       Home Medications    Prior to Admission medications   Medication Sig Start Date End Date Taking? Authorizing Provider  acetaminophen (TYLENOL) 500 MG tablet Take 500 mg by mouth every 6 (six) hours as needed for mild pain. For pain     [provider]  albuterol (PROVENTIL) (2.5 MG/3ML) 0.083% nebulizer solution Take 2.5 mg by nebulization 2 (two) times daily as needed.    [provider]  alendronate (FOSAMAX) 70 MG tablet Take 70 mg by mouth once a week. Take with a full glass of water on an empty stomach.    [provider]  aspirin (ASPIRIN EC) 81 MG EC tablet Take 1 tablet (81 mg total) by mouth daily. Swallow whole. 05/04/12   [name], MD  atorvastatin (LIPITOR) 40 MG tablet Take 40 mg by mouth. Monday, Wednesday, & Friday evening.    [provider]  Calcium Carbonate-Vit D-Min (CALTRATE 600+D PLUS) 600-400 MG-UNIT per tablet Take 1 tablet by mouth 2 (two) times daily.      [provider]  digoxin (LANOXIN) 0.125 MG tablet Take 1 tablet (0.125 mg total) by mouth every evening. 09/21/16  [name], [name], MD  diltiazem (CARDIZEM) 30 MG tablet Take 1 tablet (30 mg total) by mouth every 6 (six) hours. 09/21/16   [name], [name], MD  docusate sodium (COLACE) 100 MG capsule Take 100 mg by mouth at bedtime.    [provider]  furosemide (LASIX) 40 MG tablet Take 10 mg by mouth 2 (two) times daily.     [provider]  guaiFENesin (MUCINEX) 600 MG 12 hr tablet Take 2 tablets (1,200 mg total) by mouth 2 (two) times daily. 09/21/16   [name], [name], MD  levETIRAcetam (KEPPRA) 250 MG tablet Takes one tablet in the morning and half a tablet at 3 pm.    [provider]  levofloxacin (LEVAQUIN) 500 MG tablet Take 1 tablet (500  mg total) by mouth daily. 09/21/16   [name], [name], MD  levothyroxine (SYNTHROID, LEVOTHROID) 25 MCG tablet Take 25 mcg by mouth daily before breakfast.    [provider]  midodrine (PROAMATINE) 2.5 MG tablet Take 1 tablet (2.5 mg total) by mouth 2 (two) times daily with a meal. The dosing can be titrated down to once daily when her systolic blood pressure is consistently in the 100s. 05/04/12   [name], MD  Multiple Vitamins-Minerals (ICAPS) CAPS Take 1 capsule by mouth 2 (two) times daily.    [provider]  nitroGLYCERIN (NITROSTAT) 0.4 MG SL tablet Place 0.4 mg under the tongue every 5 (five) minutes as needed for chest pain.    [provider]  omeprazole (PRILOSEC OTC) 20 MG tablet Take 20 mg by mouth. Monday, Wednesday, & Friday evening.    [provider]  potassium chloride (KLOR-CON) 10 MEQ CR tablet Take 10 mEq by mouth daily.      [provider]  predniSONE (DELTASONE) 10 MG tablet Take 1 tablet (10 mg total) by mouth daily with breakfast. Take 6 tablets today and then decrease by 1 tablet daily until none are left. 09/21/16   [name], [name], MD  Probiotic Product (PROBIOTIC DAILY PO) Take 1 tablet by mouth every morning.    [provider]    Family History Family History  Problem Relation Age of Onset  . Coronary artery disease Mother     Social History Social History  Substance Use Topics  . Smoking status: Former Smoker    Packs/day: 0.80    Years: 40.00    Types: Cigarettes    Quit date: 05/23/1990  . Smokeless tobacco: Never Used  . Alcohol use No     Allergies   Ivp dye [iodinated diagnostic agents]; Omnipaque [iohexol]; Sulfa antibiotics; Carbamazepine; Dilaudid [hydromorphone]; and Tramadol   Review of Systems Review of Systems  Unable to perform ROS: Dementia  Respiratory: Positive for shortness of breath.      Physical Exam Updated Vital Signs BP (!) 156/118   Pulse  (!) 130   Temp 99.1 F (37.3 C)   Resp (!) 30   Ht 5\' 2"  (1.575 m)   Wt 65.8 kg (145 lb)   SpO2 94%   BMI 26.52 kg/m   Patient Vitals for the past 24 hrs:  BP Temp Pulse Resp SpO2 Height Weight  12/04/16 1624 124/62 - (!) 109 (!) 25 95 % - -  12/04/16 1600 (!) 100/55 - (!) 114 (!) 26 94 % - -  12/04/16 1524 - - - - 94 % - -  12/04/16 1512 (!) 156/118 99.1 F (37.3 C) (!) 130 (!)  30 91 % - -  12/04/16 1511 - - - - - 5\' 2"  (1.575 m) 65.8 kg (145 lb)     Physical Exam 1525: Physical examination:  Nursing notes reviewed; Vital signs and O2 SAT reviewed;  Constitutional: Well developed, Well nourished, Uncomfortable appearing.; Head:  Normocephalic, atraumatic; Eyes: EOMI, PERRL, No scleral icterus; ENMT: Mouth and pharynx normal, Mucous membranes dry; Neck: Supple, Full range of motion, No lymphadenopathy; Cardiovascular: Tachycardic rate and irregular rhythm, No gallop; Respiratory: Breath sounds diminished & equal bilaterally, faint wheezes. Tachypneic. Sitting upright.; Chest: Nontender, Movement normal; Abdomen: Soft, Nontender, Nondistended, Normal bowel sounds; Genitourinary: No CVA tenderness; Extremities: Pulses normal, No tenderness, +2 pedal edema bilat. No calf asymmetry.; Neuro: Awake, alert, confused per hx dementia. No facial droop. Speech clear. No gross focal motor deficits in extremities.; Skin: Color normal, Warm, Dry.   ED Treatments / Results  Labs (all labs ordered are listed, but only abnormal results are displayed)   EKG  EKG Interpretation  Date/Time:  Sunday December 04 2016 15:16:47 EDT Ventricular Rate:  149 PR Interval:    QRS Duration: 80 QT Interval:  267 QTC Calculation: 421 R Axis:   65 Text Interpretation:  Atrial fibrillation with rapid V-rate with repolarization abnormality Baseline wander When compared with ECG of 09/13/2016 No significant change was found Confirmed by [facility]  MD, Nicholos Johns [phone]) on 12/04/2016 3:30:25 PM        Radiology   Procedures Procedures (including critical care time)  Medications Ordered in ED Medications  albuterol (PROVENTIL) (2.5 MG/3ML) 0.083% nebulizer solution 2.5 mg (2.5 mg Nebulization Not Given 12/04/16 1523)  diltiazem (CARDIZEM) 1 mg/mL load via infusion 10 mg (10 mg Intravenous Bolus from Bag 12/04/16 1538)    And  diltiazem (CARDIZEM) 100 mg in dextrose 5 % 100 mL (1 mg/mL) infusion (5 mg/hr Intravenous New Bag/Given 12/04/16 1537)  ipratropium-albuterol (DUONEB) 0.5-2.5 (3) MG/3ML nebulizer solution 3 mL (3 mLs Nebulization Given 12/04/16 1524)  albuterol (PROVENTIL) (2.5 MG/3ML) 0.083% nebulizer solution (2.5 mg  Given 12/04/16 1523)     Initial Impression / Assessment and Plan / ED Course  I have reviewed the triage vital signs and the nursing notes.  Pertinent labs & imaging results that were available during my care of the patient were reviewed by me and considered in my medical decision making (see chart for details).  MDM Reviewed: previous chart, nursing note and vitals Reviewed previous: labs and ECG Interpretation: labs, ECG and x-ray Total time providing critical care: 30-74 minutes. This excludes time spent performing separately reportable procedures and services. Consults: admitting MD   CRITICAL CARE Performed by: Laray Anger Total critical care time: 35 minutes Critical care time was exclusive of separately billable procedures and treating other patients. Critical care was necessary to treat or prevent imminent or life-threatening deterioration. Critical care was time spent personally by me on the following activities: development of treatment plan with patient and/or surrogate as well as nursing, discussions with consultants, evaluation of patient's response to treatment, examination of patient, obtaining history from patient or surrogate, ordering and performing treatments and interventions, ordering and review of laboratory studies, ordering  and review of radiographic studies, pulse oximetry and re-evaluation of patient's condition.   Results for orders placed or performed during the hospital encounter of 12/04/16  Basic metabolic panel  Result Value Ref Range   Sodium 141 135 - 145 mmol/L   Potassium 4.0 3.5 - 5.1 mmol/L   Chloride 101 101 - 111 mmol/L  CO2 28 22 - 32 mmol/L   Glucose, Bld 129 (H) 65 - 99 mg/dL   BUN 10 6 - 20 mg/dL   Creatinine, Ser 7.820.48 0.44 - 1.00 mg/dL   Calcium 9.3 8.9 - 95.610.3 mg/dL   GFR calc non Af Amer >60 >60 mL/min   GFR calc Af Amer >60 >60 mL/min   Anion gap 12 5 - 15  Brain natriuretic peptide  Result Value Ref Range   B Natriuretic Peptide 135.0 (H) 0.0 - 100.0 pg/mL  Troponin I  Result Value Ref Range   Troponin I <0.03 <0.03 ng/mL  CBC with Differential  Result Value Ref Range   WBC 20.4 (H) 4.0 - 10.5 K/uL   RBC 5.04 3.87 - 5.11 MIL/uL   Hemoglobin 14.6 12.0 - 15.0 g/dL   HCT 21.345.5 08.636.0 - 57.846.0 %   MCV 90.3 78.0 - 100.0 fL   MCH 29.0 26.0 - 34.0 pg   MCHC 32.1 30.0 - 36.0 g/dL   RDW 46.915.2 62.911.5 - 52.815.5 %   Platelets 182 150 - 400 K/uL   Neutrophils Relative % PENDING %   Neutro Abs PENDING 1.7 - 7.7 K/uL   Band Neutrophils PENDING %   Lymphocytes Relative PENDING %   Lymphs Abs PENDING 0.7 - 4.0 K/uL   Monocytes Relative PENDING %   Monocytes Absolute PENDING 0.1 - 1.0 K/uL   Eosinophils Relative PENDING %   Eosinophils Absolute PENDING 0.0 - 0.7 K/uL   Basophils Relative PENDING %   Basophils Absolute PENDING 0.0 - 0.1 K/uL   WBC Morphology PENDING    RBC Morphology PENDING    Smear Review PENDING    nRBC PENDING 0 /100 WBC   Metamyelocytes Relative PENDING %   Myelocytes PENDING %   Promyelocytes Absolute PENDING %   Blasts PENDING %  Digoxin level  Result Value Ref Range   Digoxin Level 0.7 (L) 0.8 - 2.0 ng/mL   Dg Chest Port 1 View Result Date: 12/04/2016 CLINICAL DATA:  Dyspnea EXAM: PORTABLE CHEST 1 VIEW COMPARISON:  09/17/2016 chest radiograph. FINDINGS:  Stable cardiomediastinal silhouette with mild cardiomegaly and aortic atherosclerosis. No pneumothorax. No pleural effusion. No overt pulmonary edema. No acute consolidative airspace disease. IMPRESSION: Stable mild cardiomegaly without pulmonary edema. No acute pulmonary disease. Electronically Signed   By: Delbert PhenixJason A Poff M.D.   On: 12/04/2016 15:54     1640:  IV cardizem bolus and gtt started on pt's arrival for afib/RVR, rate 140-150's. IV solumedrol and several short nebs given for diminished lung sounds and tachypnea. Lungs continue diminished, but pt's O2 Sats now increased to 95% on O2 2L N/C (were 81% R/A on arrival).  HR has improved to 100-110's; will increase cardizem gtt rate. Dx and testing d/w pt and family.  Questions answered.  Verb understanding, agreeable to admit.  T/C to [facility] Dr. Ardyth HarpsHernandez, case discussed, including:  HPI, pertinent PM/SHx, VS/PE, dx testing, ED course and treatment:  Agreeable to admit.         Final Clinical Impressions(s) / ED Diagnoses   Final diagnoses:  None    New Prescriptions New Prescriptions   No medications on file      Samuel JesterMcManus, Tehya Leath, DO 12/07/16 0840

## 2016-12-05 DIAGNOSIS — I5032 Chronic diastolic (congestive) heart failure: Secondary | ICD-10-CM

## 2016-12-05 DIAGNOSIS — F0280 Dementia in other diseases classified elsewhere without behavioral disturbance: Secondary | ICD-10-CM

## 2016-12-05 LAB — CBC
HEMATOCRIT: 40.7 % (ref 36.0–46.0)
Hemoglobin: 13.1 g/dL (ref 12.0–15.0)
MCH: 29 pg (ref 26.0–34.0)
MCHC: 32.2 g/dL (ref 30.0–36.0)
MCV: 90.2 fL (ref 78.0–100.0)
Platelets: 170 10*3/uL (ref 150–400)
RBC: 4.51 MIL/uL (ref 3.87–5.11)
RDW: 15.3 % (ref 11.5–15.5)
WBC: 16 10*3/uL — AB (ref 4.0–10.5)

## 2016-12-05 LAB — BASIC METABOLIC PANEL
ANION GAP: 12 (ref 5–15)
BUN: 12 mg/dL (ref 6–20)
CHLORIDE: 101 mmol/L (ref 101–111)
CO2: 26 mmol/L (ref 22–32)
Calcium: 9.2 mg/dL (ref 8.9–10.3)
Creatinine, Ser: 0.63 mg/dL (ref 0.44–1.00)
GFR calc Af Amer: >60 mL/min (ref >60)
GFR calc non Af Amer: >60 mL/min (ref >60)
GLUCOSE: 149 mg/dL — AB (ref 65–99)
POTASSIUM: 3.6 mmol/L (ref 3.5–5.1)
Sodium: 139 mmol/L (ref 135–145)

## 2016-12-05 LAB — TROPONIN I
Troponin I: <0.03 ng/mL (ref <0.03)
Troponin I: <0.03 ng/mL (ref <0.03)

## 2016-12-05 MED ORDER — LORAZEPAM 0.5 MG PO TABS
0.5000 mg | ORAL_TABLET | Freq: Once | ORAL | Status: AC
  Administered 2016-12-05: 0.5 mg via ORAL
  Filled 2016-12-05: qty 1

## 2016-12-05 MED ORDER — IPRATROPIUM-ALBUTEROL 0.5-2.5 (3) MG/3ML IN SOLN
3.0000 mL | Freq: Four times a day (QID) | RESPIRATORY_TRACT | Status: DC
  Administered 2016-12-05 – 2016-12-06 (×4): 3 mL via RESPIRATORY_TRACT
  Filled 2016-12-05 (×4): qty 3

## 2016-12-05 MED ORDER — DILTIAZEM HCL 30 MG PO TABS
30.0000 mg | ORAL_TABLET | Freq: Four times a day (QID) | ORAL | Status: DC
  Administered 2016-12-05 – 2016-12-06 (×3): 30 mg via ORAL
  Filled 2016-12-05 (×3): qty 1

## 2016-12-05 MED ORDER — ASPIRIN 81 MG PO CHEW
81.0000 mg | CHEWABLE_TABLET | Freq: Every day | ORAL | Status: DC
  Administered 2016-12-05 – 2016-12-06 (×2): 81 mg via ORAL
  Filled 2016-12-05 (×2): qty 1

## 2016-12-05 NOTE — Progress Notes (Signed)
Patient continues to be defiant and attempts to get OOB and refusing to allow monitoring devices to be returned to er person. -provider is aware that patient becomes aggressive and removes the leads in a forceful manor while instructing staff to leave her alone and leave her room.

## 2016-12-05 NOTE — Clinical Social Work Note (Signed)
Clinical Social Work Assessment  Patient Details  Name: Deborah [name] MRN: [id number] Date of Birth: [birth year]  Date of referral:  12/05/16               Reason for consult:  Discharge Planning                Permission sought to share information with:  Case Manager, Facility Medical sales representativeContact Representative, Family Supports Permission granted to share information::  Yes, Verbal Permission Granted  Name::        Agency::  [facility] [place]  [facility]   Relationship::  family  Contact Information:     Housing/Transportation Living arrangements for the past 2 months:  [facility]isted Press [facility] Facility (memory care) Source of Information:  Medical Team, Case Production designer, theatre/television/filmManager, Facility Patient Interpreter Needed:  None Criminal Activity/Legal Involvement Pertinent to Current Situation/Hospitalization:  No - Comment as needed Significant Relationships:  Adult Children, Other Family Members, Community Support Lives with:  Facility Resident Do you feel safe going back to the place where you live?  Yes Need for family participation in patient care:  Yes (Comment) (Dementia)  Care giving concerns:  Patient is a long term resident at [facility] [place].  Discussed care plans with Marcelino DusterMichelle who reports patient is able to return at discharge, requesting home health at discharge if possible for continued care.  Patient may be discharged in next 24 hours per MD.   [facility] a [facility] since September, [facility].  She receives supervision in the shower and staff cuts her food. Patient feeds herself, ambulates with a cane, dresses herself, and toilets herself. Patient has episodes of incontinence of bladder and bowels at times.  Patient's family plan on her returning at discharge.     Social Worker assessment / plan:  Return to [facility] at time of DC FL2 needs to be completed with discharge medications  Employment status:  Retired Health and safety inspectornsurance information:  Medicare PT Recommendations:  Not  assessed at this time Information / Referral to community resources:     Patient/Family's Response to care:  Agreeable to plan  Patient/Family's Understanding of and Emotional Response to Diagnosis, Current Treatment, and Prognosis:  Understanding of plan.  Emotional Assessment Appearance:  Appears stated age Attitude/Demeanor/Rapport:    Affect (typically observed):  Quiet (confused, resting) Orientation:  Oriented to Self Alcohol / Substance use:  Not Applicable Psych involvement (Current and /or in the community):  No (Comment)  Discharge Needs  Concerns to be addressed:  No discharge needs identified Readmission within the last 30 days:  No Current discharge risk:  None Barriers to Discharge:  No Barriers Identified   Raye SorrowCoble, Cyril Railey N, LCSW 12/05/2016, 1:25 PM

## 2016-12-05 NOTE — Progress Notes (Signed)
Patient decline at medications at this time. -Colace 100 mg  -Kepa 250 mg -Oscal 500-200 mg-Units -Lovenox 40 mg (attempted again now as the patient declined earlier) All wasted in the sharps box.

## 2016-12-05 NOTE — Progress Notes (Signed)
Pt removed both IV's. Unable to obtain access at this time due to pt's agitation. MD aware; will not restart IVs at this time. New orders received and followed. Will continue to monitor.

## 2016-12-05 NOTE — Progress Notes (Signed)
PROGRESS NOTE    Deborah [name]  ZOX:[id number] DOB: Oct 15, 1925 DOA: 12/04/2016 PCP: Ignatius SpeckingVyas, [name], MD     Brief Narrative:  83 year old woman admitted from assisted living facility on 7/15 due to shortness of breath and cough. In the ED was found to be in A. fib with rapid ventricular response and admission was requested.   Assessment & Plan:   Principal Problem:   Atrial fibrillation with RVR (HCC) Active Problems:   Mixed hyperlipidemia   DIASTOLIC HEART FAILURE, CHRONIC   Hypotension   Alzheimer disease   Seizures (HCC)   A. fib with rapid ventricular response -Rates are now controlled, remains on a Cardizem drip. -Will resume home dose of Cardizem which is 30 mg orally every 6 hours, start weaning Cardizem drip and consider transferring to the floor once off drip. -Not on anticoagulation due to prior history of falls and dementia. -2-D echo is pending.  Alzheimer's dementia -Stable, without behavioral disturbances.  Hyperlipidemia -Continue statin  Seizure disorder -Continue Keppra  Chronic diastolic CHF -Does not appear to be exacerbated at present despite rapid A. Fib.   DVT prophylaxis: Lovenox  Code Status: DO NOT RESUSCITATE  Family Communication: Patient only  Disposition Plan: Anticipate discharge back to ALF in 24 hours  Consultants:  None  Procedures:  None  Antimicrobials:  Anti-infectives    None       Subjective: Feels well, no complaints, sitting in chair, appears more lucid than she did yesterday.   Objective: Vitals:   12/05/16 0900 12/05/16 1000 12/05/16 1030 12/05/16 1110  BP: (!) 114/53 (!) 93/55 (!) 113/56   Pulse: 68 80 (!) 111 (!) 58  Resp:      Temp:    98.2 F (36.8 C)  TempSrc:    Oral  SpO2: 96% 96% 95% 99%  Weight:      Height:        Intake/Output Summary (Last 24 hours) at 12/05/16 1128 Last data filed at 12/05/16 0900  Gross per 24 hour  Intake           123.83 ml  Output              175 ml  Net            -51.17 ml   Filed Weights   12/04/16 1511 12/04/16 1731 12/05/16 0500  Weight: 65.8 kg (145 lb) 62.3 kg (137 lb 5.6 oz) 62 kg (136 lb 11 oz)    Examination:  General exam: Alert, awake, oriented x 3 Respiratory system: Clear to auscultation. Respiratory effort normal. Cardiovascular system:RRR. No murmurs, rubs, gallops. Gastrointestinal system: Abdomen is nondistended, soft and nontender. No organomegaly or masses felt. Normal bowel sounds heard. Central nervous system: Alert and oriented. No focal neurological deficits. Extremities: No C/C/E, +pedal pulses Skin: No rashes, lesions or ulcers Psychiatry: Judgement and insight appear normal. Mood & affect appropriate.     Data Reviewed: I have personally reviewed following labs and imaging studies  CBC:  Recent Labs Lab 12/04/16 1519 12/05/16 0426  WBC 20.4* 16.0*  NEUTROABS 18.8*  --   HGB 14.6 13.1  HCT 45.5 40.7  MCV 90.3 90.2  PLT 182 170   Basic Metabolic Panel:  Recent Labs Lab 12/04/16 1519 12/04/16 2011 12/05/16 0426  NA 141  --  139  K 4.0  --  3.6  CL 101  --  101  CO2 28  --  26  GLUCOSE 129*  --  149*  BUN 10  --  12  CREATININE 0.48  --  0.63  CALCIUM 9.3  --  9.2  MG  --  1.9  --    GFR: Estimated Creatinine Clearance: 43.7 mL/min (by C-G formula based on SCr of 0.63 mg/dL). Liver Function Tests: No results for input(s): AST, ALT, ALKPHOS, BILITOT, PROT, ALBUMIN in the last 168 hours. No results for input(s): LIPASE, AMYLASE in the last 168 hours. No results for input(s): AMMONIA in the last 168 hours. Coagulation Profile: No results for input(s): INR, PROTIME in the last 168 hours. Cardiac Enzymes:  Recent Labs Lab 12/04/16 1519 12/04/16 2011 12/05/16 0426  TROPONINI <0.03 <0.03 <0.03   BNP (last 3 results) No results for input(s): PROBNP in the last 8760 hours. HbA1C: No results for input(s): HGBA1C in the last 72 hours. CBG: No results for input(s): GLUCAP in the last 168  hours. Lipid Profile: No results for input(s): CHOL, HDL, LDLCALC, TRIG, CHOLHDL, LDLDIRECT in the last 72 hours. Thyroid Function Tests:  Recent Labs  12/04/16 2011  TSH 1.276   Anemia Panel: No results for input(s): VITAMINB12, FOLATE, FERRITIN, TIBC, IRON, RETICCTPCT in the last 72 hours. Urine analysis:    Component Value Date/Time   COLORURINE YELLOW 04/28/2012 1830   APPEARANCEUR CLEAR 04/28/2012 1830   LABSPEC 1.010 04/28/2012 1830   PHURINE 6.5 04/28/2012 1830   GLUCOSEU NEGATIVE 04/28/2012 1830   HGBUR TRACE (A) 04/28/2012 1830   BILIRUBINUR NEGATIVE 04/28/2012 1830   KETONESUR NEGATIVE 04/28/2012 1830   PROTEINUR NEGATIVE 04/28/2012 1830   UROBILINOGEN 0.2 04/28/2012 1830   NITRITE NEGATIVE 04/28/2012 1830   LEUKOCYTESUR NEGATIVE 04/28/2012 1830   Sepsis Labs: @LABRCNTIP (procalcitonin:4,lacticidven:4)  ) Recent Results (from the past 240 hour(s))  MRSA PCR Screening     Status: None   Collection Time: 12/04/16  5:59 PM  Result Value Ref Range Status   MRSA by PCR NEGATIVE NEGATIVE Final    Comment:        The GeneXpert MRSA Assay (FDA approved for NASAL specimens only), is one component of a comprehensive MRSA colonization surveillance program. It is not intended to diagnose MRSA infection nor to guide or monitor treatment for MRSA infections.          Radiology Studies: Dg Chest Port 1 View  Result Date: 12/04/2016 CLINICAL DATA:  Dyspnea EXAM: PORTABLE CHEST 1 VIEW COMPARISON:  09/17/2016 chest radiograph. FINDINGS: Stable cardiomediastinal silhouette with mild cardiomegaly and aortic atherosclerosis. No pneumothorax. No pleural effusion. No overt pulmonary edema. No acute consolidative airspace disease. IMPRESSION: Stable mild cardiomegaly without pulmonary edema. No acute pulmonary disease. Electronically Signed   By: Delbert Phenix M.D.   On: 12/04/2016 15:54        Scheduled Meds: . aspirin  81 mg Oral Daily  . atorvastatin  40 mg Oral  q1800  . calcium-vitamin D  1 tablet Oral BID  . digoxin  0.125 mg Oral QPM  . docusate sodium  100 mg Oral QHS  . enoxaparin (LOVENOX) injection  40 mg Subcutaneous Q24H  . furosemide  10 mg Oral BID  . ipratropium-albuterol  3 mL Nebulization Q6H  . levETIRAcetam  250 mg Oral BID  . levothyroxine  25 mcg Oral QAC breakfast  . midodrine  2.5 mg Oral BID WC  . multivitamin with minerals  1 tablet Oral Daily  . pantoprazole  40 mg Oral Daily  . potassium chloride  10 mEq Oral Daily  . sodium chloride flush  3 mL Intravenous Q12H   Continuous Infusions: .  sodium chloride    . diltiazem (CARDIZEM) infusion 5 mg/hr (12/05/16 0900)     LOS: 1 day    Time spent: 30 minutes. Greater than 50% of this time was spent in direct contact with the patient coordinating care.     Chaya Jan, MD [facility] Pager [phone]  If 7PM-7AM, please contact night-coverage [email or web address] Password [facility] 12/05/2016, 11:28 AM

## 2016-12-05 NOTE — Progress Notes (Signed)
[facility]. -the patient was given privacy and once she finished she was aided back to bed by this RN. NOTE: the patient did not want assistance anyone not from this RN nor a female Charity fundraiserN. NOTE: the patient refuses to keep her leads on, her BP cuff, her pulse-ox, and her O2  -provider to the be alerted

## 2016-12-05 NOTE — Progress Notes (Signed)
NT in room to assist pt to bathroom. Pt very agitated and attempted to hit NT. Verbally and physically aggressive at this time. Will continue to monitor.

## 2016-12-05 NOTE — Progress Notes (Signed)
1905 While receiving report the patient removed her cardiac monitor leads and vehemently refuses to the put them back on.   1925 The patient attempted to get OOB and had to be redirected. The patient did return to lying in supine position but told this RN as well as the assisting RN to get out of room and leave her alone.  1935 The patient out of bed to standing position, this RN to the bedside immediately to assist the patient back to bed. The patient began to speak in raised voice with furrowed eyebrows telling this RN to get out of her room. This RN assisted the patient back to bed. While doing so the patient coughed and indicated she was going to pass her cold.

## 2016-12-06 MED ORDER — IPRATROPIUM-ALBUTEROL 0.5-2.5 (3) MG/3ML IN SOLN: 3.0000 mL | Freq: Three times a day (TID) | RESPIRATORY_TRACT | Status: DC

## 2016-12-06 NOTE — NC FL2 (Signed)
[place] MEDICAID FL2 LEVEL OF CARE SCREENING TOOL     IDENTIFICATION  Patient Name: Deborah [name] Birthdate: 05/02/1926 Sex: female Admission Date (Current Location): 12/04/2016  [facility] and [place] Number:  [employer] and Address:  [facility],  [address] [place] [address]      Provider Number: [id number]  Attending Physician Name and Address:  No att. providers found  Relative Name and Phone Number:       Current Level of Care: Hospital Recommended Level of Care: Assisted Living Facility Prior Approval Number:    Date Approved/Denied:   PASRR Number:    Discharge Plan: Other (Comment) [facility])    Current Diagnoses: Patient Active Problem List   Diagnosis Date Noted  . Hypoxemia   . COPD with acute exacerbation (HCC) 09/13/2016  . Alzheimer disease 09/13/2016  . Seizures (HCC) 09/13/2016  . Hypokalemia 09/13/2016  . Atrial fibrillation with RVR (HCC) 09/13/2016  . COPD exacerbation (HCC) 09/13/2016  . Hypotension 04/22/2012  . Coronary atherosclerosis of native coronary artery   . COPD (chronic obstructive pulmonary disease) (HCC)   . Mixed hyperlipidemia 05/14/2009  . Permanent atrial fibrillation (HCC) 02/10/2009  . DIASTOLIC HEART FAILURE, CHRONIC 02/10/2009    Orientation RESPIRATION BLADDER Height & Weight     Self  Normal Continent Weight: 135 lb 9.3 oz (61.5 kg) Height:  5\' 5"  (165.1 cm)  BEHAVIORAL SYMPTOMS/MOOD NEUROLOGICAL BOWEL NUTRITION STATUS      Continent Diet (Low sodium/heart healthy)  AMBULATORY STATUS COMMUNICATION OF NEEDS Skin   Limited Assist Verbally Normal                       Personal Care Assistance Level of Assistance  Bathing, Feeding, Dressing   Feeding assistance: Independent Dressing Assistance: Limited assistance     Functional Limitations Info  Sight, Hearing, Speech Sight Info: Adequate Hearing Info: Adequate Speech Info: Adequate    SPECIAL CARE FACTORS FREQUENCY                        Contractures Contractures Info: Not present    Additional Factors Info  Code Status, Allergies Code Status Info: DNR Allergies Info: Ivp Dye, Omnipaque, Sulfa Antibiotics, Carbamazepine, Dilaudid, Tramadol           Current Medications (12/06/2016):  This is the current hospital active medication list Current Facility-Administered Medications  Medication Dose Route Frequency Provider Last Rate Last Dose  . 0.9 %  sodium chloride infusion  250 mL Intravenous PRN [name], [name], MD      . acetaminophen (TYLENOL) tablet 650 mg  650 mg Oral Q6H PRN [name], [name], MD       Or  . acetaminophen (TYLENOL) suppository 650 mg  650 mg Rectal Q6H PRN [name], [name], MD      . albuterol (PROVENTIL) (2.5 MG/3ML) 0.083% nebulizer solution 3 mL  3 mL Inhalation Q6H PRN [name], [name], MD   3 mL at 12/05/16 0212  . aspirin chewable tablet 81 mg  81 mg Oral Daily [name], [name], MD   81 mg at 12/06/16 0934  . atorvastatin (LIPITOR) tablet 40 mg  40 mg Oral q1800 [name], [name], MD   40 mg at 12/05/16 1722  . calcium-vitamin D (OSCAL WITH D) 500-200 MG-UNIT per tablet 1 tablet  1 tablet Oral BID [name], [name], MD   1 tablet at  12/06/16 0933  . digoxin (LANOXIN) tablet 0.125 mg  0.125 mg Oral QPM [name], [name], MD   0.125 mg at 12/05/16 1722  . diltiazem (CARDIZEM) 100 mg in dextrose 5 % 100 mL (1 mg/mL) infusion  5-15 mg/hr Intravenous Titrated [name], [name], MD   Stopped at 12/05/16 1430  . diltiazem (CARDIZEM) tablet 30 mg  30 mg Oral Q6H [name], [name], MD   30 mg at 12/06/16 0725  . docusate sodium (COLACE) capsule 100 mg  100 mg Oral QHS [name], [name], MD   100 mg at 12/04/16 2148  . enoxaparin (LOVENOX) injection 40 mg  40 mg Subcutaneous Q24H [name], [name], MD   40 mg at 12/04/16 2003  . furosemide (LASIX) tablet 10 mg  10  mg Oral BID [name], [name], MD   10 mg at 12/06/16 0802  . ipratropium-albuterol (DUONEB) 0.5-2.5 (3) MG/3ML nebulizer solution 3 mL  3 mL Nebulization TID [name], [name], MD      . levETIRAcetam (KEPPRA) tablet 250 mg  250 mg Oral BID [name], [name], MD   250 mg at 12/06/16 0933  . levothyroxine (SYNTHROID, LEVOTHROID) tablet 25 mcg  25 mcg Oral QAC breakfast [name], [name], MD   25 mcg at 12/06/16 [phone]  . midodrine (PROAMATINE) tablet 2.5 mg  2.5 mg Oral BID WC [name], [name], MD   2.5 mg at 12/06/16 0803  . multivitamin with minerals tablet 1 tablet  1 tablet Oral Daily [name], [name], MD   1 tablet at 12/06/16 [phone]  . nitroGLYCERIN (NITROSTAT) SL tablet 0.4 mg  0.4 mg Sublingual Q5 min PRN [name], [name], MD      . ondansetron [facility]) tablet 4 mg  4 mg Oral Q6H PRN [name], [name], MD       Or  . ondansetron [facility]) injection 4 mg  4 mg Intravenous Q6H PRN [name], [name], MD      . pantoprazole (PROTONIX) EC tablet 40 mg  40 mg Oral Daily [name], [name], MD   40 mg at 12/06/16 0933  . potassium chloride (K-DUR,KLOR-CON) CR tablet 10 mEq  10 mEq Oral Daily [name], [name], MD   10 mEq at 12/06/16 0933  . senna-docusate (Senokot-S) tablet 1 tablet  1 tablet Oral QHS PRN [name], [name], MD      . sodium chloride flush (NS) 0.9 % injection 3 mL  3 mL Intravenous Q12H [name], [name], MD   3 mL at 12/06/16 0937  . sodium chloride flush (NS) 0.9 % injection 3 mL  3 mL Intravenous PRN [name], [name], MD       Current Outpatient Prescriptions  Medication Sig Dispense Refill  . acetaminophen (TYLENOL) 500 MG tablet Take 500 mg by mouth every 6 (six) hours as needed for mild pain. For pain     . albuterol (PROVENTIL HFA;VENTOLIN HFA) 108 (90 Base) MCG/ACT inhaler Inhale 2 puffs into the lungs every 6 (six) hours as needed for wheezing or  shortness of breath.    Marland Kitchen aspirin (ASPIRIN EC) 81 MG EC tablet Take 1 tablet (81 mg total) by mouth daily. Swallow whole.    Marland Kitchen atorvastatin (LIPITOR) 40 MG tablet Take 40 mg by mouth. Monday, Wednesday, & Friday evening.    . Calcium Carbonate-Vit D-Min (CALTRATE 600+D PLUS) 600-400 MG-UNIT per tablet Take 1  tablet by mouth 2 (two) times daily.      . digoxin (LANOXIN) 0.125 MG tablet Take 1 tablet (0.125 mg total) by mouth every evening. 30 tablet 2  . diltiazem (CARDIZEM) 30 MG tablet Take 1 tablet (30 mg total) by mouth every 6 (six) hours. 120 tablet 2  . docusate sodium (COLACE) 100 MG capsule Take 100 mg by mouth at bedtime.    . furosemide (LASIX) 20 MG tablet Take 10 mg by mouth 2 (two) times daily.     Marland Kitchen guaiFENesin (MUCINEX) 600 MG 12 hr tablet Take 2 tablets (1,200 mg total) by mouth 2 (two) times daily.    Marland Kitchen levETIRAcetam (KEPPRA) 250 MG tablet Takes one tablet in the morning and half a tablet at 3 pm.    . levothyroxine (SYNTHROID, LEVOTHROID) 25 MCG tablet Take 25 mcg by mouth daily before breakfast.    . midodrine (PROAMATINE) 2.5 MG tablet Take 1 tablet (2.5 mg total) by mouth 2 (two) times daily with a meal. The dosing can be titrated down to once daily when her systolic blood pressure is consistently in the 100s.    . Multiple Vitamins-Minerals (ICAPS) CAPS Take 1 capsule by mouth 2 (two) times daily.    . nitroGLYCERIN (NITROSTAT) 0.4 MG SL tablet Place 0.4 mg under the tongue every 5 (five) minutes as needed for chest pain.    . potassium chloride (KLOR-CON) 10 MEQ CR tablet Take 10 mEq by mouth daily.      . Probiotic Product (PROBIOTIC DAILY PO) Take 1 tablet by mouth every morning.    Terrance Mass Salicylate (ASPERCREME) 10 % LOTN Apply 1 application topically daily as needed.    Marland Kitchen albuterol (PROVENTIL) (2.5 MG/3ML) 0.083% nebulizer solution Take 2.5 mg by nebulization 2 (two) times daily as needed.    Marland Kitchen alendronate (FOSAMAX) 70 MG tablet Take 70 mg by mouth once a week. Take  with a full glass of water on an empty stomach.    Marland Kitchen omeprazole (PRILOSEC OTC) 20 MG tablet Take 20 mg by mouth. Monday, Wednesday, & Friday evening.       Discharge Medications: TAKE these medications   acetaminophen 500 MG tablet Commonly known as:  TYLENOL Take 500 mg by mouth every 6 (six) hours as needed for mild pain. For pain   albuterol 108 (90 Base) MCG/ACT inhaler Commonly known as:  PROVENTIL HFA;VENTOLIN HFA Inhale 2 puffs into the lungs every 6 (six) hours as needed for wheezing or shortness of breath.   albuterol (2.5 MG/3ML) 0.083% nebulizer solution Commonly known as:  PROVENTIL Take 2.5 mg by nebulization 2 (two) times daily as needed.   alendronate 70 MG tablet Commonly known as:  FOSAMAX Take 70 mg by mouth once a week. Take with a full glass of water on an empty stomach.   ASPERCREME 10 % Lotn Generic drug:  Trolamine Salicylate Apply 1 application topically daily as needed.   aspirin 81 MG EC tablet Commonly known as:  aspirin EC Take 1 tablet (81 mg total) by mouth daily. Swallow whole.   atorvastatin 40 MG tablet Commonly known as:  LIPITOR Take 40 mg by mouth. Monday, Wednesday, & Friday evening.   CALTRATE 600+D PLUS 600-400 MG-UNIT per tablet Take 1 tablet by mouth 2 (two) times daily.   digoxin 0.125 MG tablet Commonly known as:  LANOXIN Take 1 tablet (0.125 mg total) by mouth every evening.   diltiazem 30 MG tablet Commonly known as:  CARDIZEM Take 1 tablet (30  mg total) by mouth every 6 (six) hours.   docusate sodium 100 MG capsule Commonly known as:  COLACE Take 100 mg by mouth at bedtime.   furosemide 20 MG tablet Commonly known as:  LASIX Take 10 mg by mouth 2 (two) times daily.   guaiFENesin 600 MG 12 hr tablet Commonly known as:  MUCINEX Take 2 tablets (1,200 mg total) by mouth 2 (two) times daily.   ICAPS Caps Take 1 capsule by mouth 2 (two) times daily.   levETIRAcetam 250 MG tablet Commonly known as:   KEPPRA Takes one tablet in the morning and half a tablet at 3 pm.   levothyroxine 25 MCG tablet Commonly known as:  SYNTHROID, LEVOTHROID Take 25 mcg by mouth daily before breakfast.   midodrine 2.5 MG tablet Commonly known as:  PROAMATINE Take 1 tablet (2.5 mg total) by mouth 2 (two) times daily with a meal. The dosing can be titrated down to once daily when her systolic blood pressure is consistently in the 100s.   nitroGLYCERIN 0.4 MG SL tablet Commonly known as:  NITROSTAT Place 0.4 mg under the tongue every 5 (five) minutes as needed for chest pain.   omeprazole 20 MG tablet Commonly known as:  PRILOSEC OTC Take 20 mg by mouth. Monday, Wednesday, & Friday evening.   potassium chloride 10 MEQ CR tablet Commonly known as:  KLOR-CON Take 10 mEq by mouth daily.   PROBIOTIC DAILY PO Take 1 tablet by mouth every morning.        Relevant Imaging Results:  Relevant Lab Results:   Additional Information    Ezariah Nace, Juleen ChinaHeather D, LCSW

## 2016-12-06 NOTE — Progress Notes (Signed)
RN Shanda BumpsJessica at the bedside to reorient the patient. -patient calmer and permits cardiac monitor to be applied in addition to BP.

## 2016-12-06 NOTE — Discharge Summary (Signed)
Physician Discharge Summary  Deborah [name] ZOX:[id number] DOB: 05-11-1926 DOA: 12/04/2016  PCP: Ignatius Specking, MD  Admit date: 12/04/2016 Discharge date: 12/06/2016  Time spent: 45 minutes  Recommendations for Outpatient Follow-up:  -Will be discharged back to ALF today. -Advised to see PCP in 2 weeks.   Discharge Diagnoses:  Principal Problem:   Atrial fibrillation with RVR (HCC) Active Problems:   Mixed hyperlipidemia   DIASTOLIC HEART FAILURE, CHRONIC   Hypotension   Alzheimer disease   Seizures (HCC)   Discharge Condition: Stable and improved  Filed Weights   12/04/16 1731 12/05/16 0500 12/06/16 0400  Weight: 62.3 kg (137 lb 5.6 oz) 62 kg (136 lb 11 oz) 61.5 kg (135 lb 9.3 oz)    History of present illness:  Deborah [name] is a 83 y.o. female with history of Alzheimer's dementia, permanent A. fib not on anticoagulation due to high risk falls, chronic diastolic CHF who presents to the hospital today with shortness of breath. History is very limited given the condition of the patient and her history of dementia. All she can tell me is that she has been coughing and felt short of breath and I'm assuming this is why she was sent to the hospital today for evaluation. In the ED she was noticed to be in A. fib with RVR with a heart rate in the upper 140s, labs are unremarkable with the exception of a WBC count of 20.4, chest x-ray without edema or other acute issues. Admission has been requested for management of A. fib with RVR.  Hospital Course:   A. fib with rapid ventricular response -Has been transitioned over to her home dose of PO cardizem. -Rates remain in the 100-110s. -Not on anticoagulation due to prior history of falls and dementia.  Alzheimer's dementia -With sundowning.  Hyperlipidemia -Continue statin  Seizure disorder -Continue Keppra  Chronic diastolic CHF -Does not appear to be exacerbated at present despite rapid A. Fib.  Procedures:  None     Consultations:  None  Discharge Instructions  Discharge Instructions    Diet - low sodium heart healthy    Complete by:  As directed    Increase activity slowly    Complete by:  As directed      Allergies as of 12/06/2016      Reactions   Ivp Dye [iodinated Diagnostic Agents] Shortness Of Breath   ??   Omnipaque [iohexol] Shortness Of Breath, Other (See Comments)   Short of breath with chest tightness after IV injection in CT, pt was fine when she left department but developed symptoms in parking lot and went to emergency department.   Sulfa Antibiotics Shortness Of Breath   Carbamazepine    REACTION: toxemia   Dilaudid [hydromorphone] Nausea And Vomiting   Tramadol Other (See Comments)   confusion      Medication List    TAKE these medications   acetaminophen 500 MG tablet Commonly known as:  TYLENOL Take 500 mg by mouth every 6 (six) hours as needed for mild pain. For pain   albuterol 108 (90 Base) MCG/ACT inhaler Commonly known as:  PROVENTIL HFA;VENTOLIN HFA Inhale 2 puffs into the lungs every 6 (six) hours as needed for wheezing or shortness of breath.   albuterol (2.5 MG/3ML) 0.083% nebulizer solution Commonly known as:  PROVENTIL Take 2.5 mg by nebulization 2 (two) times daily as needed.   alendronate 70 MG tablet Commonly known as:  FOSAMAX Take 70 mg by mouth once  a week. Take with a full glass of water on an empty stomach.   ASPERCREME 10 % Lotn Generic drug:  Trolamine Salicylate Apply 1 application topically daily as needed.   aspirin 81 MG EC tablet Commonly known as:  aspirin EC Take 1 tablet (81 mg total) by mouth daily. Swallow whole.   atorvastatin 40 MG tablet Commonly known as:  LIPITOR Take 40 mg by mouth. Monday, Wednesday, & Friday evening.   CALTRATE 600+D PLUS 600-400 MG-UNIT per tablet Take 1 tablet by mouth 2 (two) times daily.   digoxin 0.125 MG tablet Commonly known as:  LANOXIN Take 1 tablet (0.125 mg total) by mouth every  evening.   diltiazem 30 MG tablet Commonly known as:  CARDIZEM Take 1 tablet (30 mg total) by mouth every 6 (six) hours.   docusate sodium 100 MG capsule Commonly known as:  COLACE Take 100 mg by mouth at bedtime.   furosemide 20 MG tablet Commonly known as:  LASIX Take 10 mg by mouth 2 (two) times daily.   guaiFENesin 600 MG 12 hr tablet Commonly known as:  MUCINEX Take 2 tablets (1,200 mg total) by mouth 2 (two) times daily.   ICAPS Caps Take 1 capsule by mouth 2 (two) times daily.   levETIRAcetam 250 MG tablet Commonly known as:  KEPPRA Takes one tablet in the morning and half a tablet at 3 pm.   levothyroxine 25 MCG tablet Commonly known as:  SYNTHROID, LEVOTHROID Take 25 mcg by mouth daily before breakfast.   midodrine 2.5 MG tablet Commonly known as:  PROAMATINE Take 1 tablet (2.5 mg total) by mouth 2 (two) times daily with a meal. The dosing can be titrated down to once daily when her systolic blood pressure is consistently in the 100s.   nitroGLYCERIN 0.4 MG SL tablet Commonly known as:  NITROSTAT Place 0.4 mg under the tongue every 5 (five) minutes as needed for chest pain.   omeprazole 20 MG tablet Commonly known as:  PRILOSEC OTC Take 20 mg by mouth. Monday, Wednesday, & Friday evening.   potassium chloride 10 MEQ CR tablet Commonly known as:  KLOR-CON Take 10 mEq by mouth daily.   PROBIOTIC DAILY PO Take 1 tablet by mouth every morning.      Allergies  Allergen Reactions  . Ivp Dye [Iodinated Diagnostic Agents] Shortness Of Breath    ??  . Omnipaque [Iohexol] Shortness Of Breath and Other (See Comments)    Short of breath with chest tightness after IV injection in CT, pt was fine when she left department but developed symptoms in parking lot and went to emergency department.  . Sulfa Antibiotics Shortness Of Breath  . Carbamazepine     REACTION: toxemia  . Dilaudid [Hydromorphone] Nausea And Vomiting  . Tramadol Other (See Comments)     confusion   Follow-up Information    [name], [name], MD. Schedule an appointment as soon as possible for a visit in 2 week(s).   Specialty:  Internal Medicine Contact information: [address] [place] [address] [phone]            The results of significant diagnostics from this hospitalization (including imaging, microbiology, ancillary and laboratory) are listed below for reference.    Significant Diagnostic Studies: Dg Chest Port 1 View  Result Date: 12/04/2016 CLINICAL DATA:  Dyspnea EXAM: PORTABLE CHEST 1 VIEW COMPARISON:  09/17/2016 chest radiograph. FINDINGS: Stable cardiomediastinal silhouette with mild cardiomegaly and aortic atherosclerosis. No pneumothorax. No pleural effusion. No  overt pulmonary edema. No acute consolidative airspace disease. IMPRESSION: Stable mild cardiomegaly without pulmonary edema. No acute pulmonary disease. Electronically Signed   By: Delbert PhenixJason A Poff M.D.   On: 12/04/2016 15:54    Microbiology: Recent Results (from the past 240 hour(s))  MRSA PCR Screening     Status: None   Collection Time: 12/04/16  5:59 PM  Result Value Ref Range Status   MRSA by PCR NEGATIVE NEGATIVE Final    Comment:        The GeneXpert MRSA Assay (FDA approved for NASAL specimens only), is one component of a comprehensive MRSA colonization surveillance program. It is not intended to diagnose MRSA infection nor to guide or monitor treatment for MRSA infections.      Labs: Basic Metabolic Panel:  Recent Labs Lab 12/04/16 1519 12/04/16 2011 12/05/16 0426  NA 141  --  139  K 4.0  --  3.6  CL 101  --  101  CO2 28  --  26  GLUCOSE 129*  --  149*  BUN 10  --  12  CREATININE 0.48  --  0.63  CALCIUM 9.3  --  9.2  MG  --  1.9  --    Liver Function Tests: No results for input(s): AST, ALT, ALKPHOS, BILITOT, PROT, ALBUMIN in the last 168 hours. No results for input(s): LIPASE, AMYLASE in the last 168 hours. No results for input(s): AMMONIA in the last 168  hours. CBC:  Recent Labs Lab 12/04/16 1519 12/05/16 0426  WBC 20.4* 16.0*  NEUTROABS 18.8*  --   HGB 14.6 13.1  HCT 45.5 40.7  MCV 90.3 90.2  PLT 182 170   Cardiac Enzymes:  Recent Labs Lab 12/04/16 1519 12/04/16 2011 12/05/16 0426 12/05/16 1045  TROPONINI <0.03 <0.03 <0.03 <0.03   BNP: BNP (last 3 results)  Recent Labs  09/13/16 0302 09/16/16 1924 12/04/16 1519  BNP 134.0* 217.0* 135.0*    ProBNP (last 3 results) No results for input(s): PROBNP in the last 8760 hours.  CBG: No results for input(s): GLUCAP in the last 168 hours.     SignedChaya Jan:  HERNANDEZ ACOSTA,Deborah [name]  [facility] Pager: [phone] 12/06/2016, 10:46 AM

## 2016-12-06 NOTE — Progress Notes (Signed)
Patient continues to rest in NAD with RR and effort WDP.

## 2017-03-23 ENCOUNTER — Encounter: Payer: Self-pay | Admitting: Adult Health

## 2017-03-24 ENCOUNTER — Ambulatory Visit (INDEPENDENT_AMBULATORY_CARE_PROVIDER_SITE_OTHER): Payer: Medicare Other | Admitting: Adult Health

## 2017-03-24 ENCOUNTER — Encounter: Payer: Self-pay | Admitting: Adult Health

## 2017-03-24 VITALS — BP 90/72 | HR 80 | Ht 60.75 in | Wt 133.5 lb

## 2017-03-24 DIAGNOSIS — B373 Candidiasis of vulva and vagina: Secondary | ICD-10-CM

## 2017-03-24 DIAGNOSIS — K649 Unspecified hemorrhoids: Secondary | ICD-10-CM

## 2017-03-24 DIAGNOSIS — Z1212 Encounter for screening for malignant neoplasm of rectum: Secondary | ICD-10-CM | POA: Diagnosis not present

## 2017-03-24 DIAGNOSIS — N898 Other specified noninflammatory disorders of vagina: Secondary | ICD-10-CM | POA: Diagnosis not present

## 2017-03-24 DIAGNOSIS — Z1211 Encounter for screening for malignant neoplasm of colon: Secondary | ICD-10-CM | POA: Diagnosis not present

## 2017-03-24 DIAGNOSIS — B3731 Acute candidiasis of vulva and vagina: Secondary | ICD-10-CM

## 2017-03-24 LAB — HEMOCCULT GUIAC POC 1CARD (OFFICE): FECAL OCCULT BLD: NEGATIVE

## 2017-03-24 MED ORDER — NYSTATIN 100000 UNIT/GM EX OINT: 1.0000 "application " | TOPICAL_OINTMENT | Freq: Two times a day (BID) | CUTANEOUS | 0 refills | Status: DC

## 2017-03-24 MED ORDER — PRAMOX-PE-GLYCERIN-PETROLATUM 1-0.25-14.4-15 % RE CREA: TOPICAL_CREAM | RECTAL | 0 refills | Status: DC

## 2017-03-24 NOTE — Progress Notes (Signed)
Subjective:     Patient ID: Deborah [name], female   DOB: 04-Dec-1925, 83 y.o.   MRN: [id number]  HPI Deborah [name] is a 83 year old white female, widowed, [facility] for itching in vaginal and rectal area for 2-3 weeks.She says she has put ice on it when really bad and it helps some.   Review of Systems Itching in vaginal and rectal area for 2-3 weeks Reviewed past medical,surgical, social and family history. Reviewed medications and allergies.     Objective:   Physical Exam BP 90/72 (BP Location: Left Arm, Patient Position: Sitting, Cuff Size: Normal)   Pulse 80   Ht 5' 0.75" (1.543 m)   Wt 133 lb 8 oz (60.6 kg)   BMI 25.43 kg/m    PHQ 2 score 0. Skin warm and dry.Pelvic: external genitalia is red and inner labia are slightly swollen and tender to touch., vagina: is pale and dry,urethra has no lesions or masses noted, cervix:poorly visulaized, uterus: normal size, shape and contour, non tender, no masses felt, adnexa: no masses or tenderness noted. Bladder is non tender and no masses felt. On rectal has good tone, +hemorrhoids and hemoccult was negative.Painted vulva and inner vagina with gentian violet.   Assessment:       1. Vulval thrush   2. Itching in the vaginal area   3. Hemorrhoids, unspecified hemorrhoid type   4. Screening for colorectal cancer       Plan:     Rx nystatin ointment, use 2-3 x daily to affected area Keep dry Use preparation H daily prn Follow up in 2 weeks

## 2017-03-24 NOTE — Patient Instructions (Signed)
Keep dry Use preparation H prn Use nystatin ointment 2-3 x daily F/U in 2 weeks

## 2017-03-29 ENCOUNTER — Encounter: Payer: Self-pay | Admitting: Adult Health

## 2017-04-06 ENCOUNTER — Ambulatory Visit: Payer: Medicare Other | Admitting: Adult Health

## 2017-05-18 ENCOUNTER — Ambulatory Visit (INDEPENDENT_AMBULATORY_CARE_PROVIDER_SITE_OTHER): Payer: Medicare Other | Admitting: Otolaryngology

## 2017-05-18 DIAGNOSIS — H9011 Conductive hearing loss, unilateral, right ear, with unrestricted hearing on the contralateral side: Secondary | ICD-10-CM | POA: Diagnosis not present

## 2017-05-18 DIAGNOSIS — H6121 Impacted cerumen, right ear: Secondary | ICD-10-CM

## 2017-05-18 DIAGNOSIS — H608X1 Other otitis externa, right ear: Secondary | ICD-10-CM

## 2017-06-21 ENCOUNTER — Encounter (HOSPITAL_COMMUNITY): Payer: Self-pay

## 2017-06-21 ENCOUNTER — Inpatient Hospital Stay (HOSPITAL_COMMUNITY)
Admission: EM | Admit: 2017-06-21 | Discharge: 2017-06-23 | DRG: 191 | Payer: Medicare Other | Attending: Family Medicine | Admitting: Family Medicine

## 2017-06-21 ENCOUNTER — Emergency Department (HOSPITAL_COMMUNITY): Payer: Medicare Other

## 2017-06-21 ENCOUNTER — Other Ambulatory Visit: Payer: Self-pay

## 2017-06-21 DIAGNOSIS — E782 Mixed hyperlipidemia: Secondary | ICD-10-CM | POA: Diagnosis present

## 2017-06-21 DIAGNOSIS — M4854XA Collapsed vertebra, not elsewhere classified, thoracic region, initial encounter for fracture: Secondary | ICD-10-CM | POA: Diagnosis present

## 2017-06-21 DIAGNOSIS — Z9849 Cataract extraction status, unspecified eye: Secondary | ICD-10-CM

## 2017-06-21 DIAGNOSIS — Z7982 Long term (current) use of aspirin: Secondary | ICD-10-CM

## 2017-06-21 DIAGNOSIS — I5033 Acute on chronic diastolic (congestive) heart failure: Secondary | ICD-10-CM | POA: Diagnosis present

## 2017-06-21 DIAGNOSIS — Z888 Allergy status to other drugs, medicaments and biological substances status: Secondary | ICD-10-CM

## 2017-06-21 DIAGNOSIS — Z882 Allergy status to sulfonamides status: Secondary | ICD-10-CM

## 2017-06-21 DIAGNOSIS — E039 Hypothyroidism, unspecified: Secondary | ICD-10-CM | POA: Diagnosis present

## 2017-06-21 DIAGNOSIS — Z8249 Family history of ischemic heart disease and other diseases of the circulatory system: Secondary | ICD-10-CM

## 2017-06-21 DIAGNOSIS — I110 Hypertensive heart disease with heart failure: Secondary | ICD-10-CM | POA: Diagnosis present

## 2017-06-21 DIAGNOSIS — Z8701 Personal history of pneumonia (recurrent): Secondary | ICD-10-CM

## 2017-06-21 DIAGNOSIS — Z885 Allergy status to narcotic agent status: Secondary | ICD-10-CM

## 2017-06-21 DIAGNOSIS — S22000A Wedge compression fracture of unspecified thoracic vertebra, initial encounter for closed fracture: Secondary | ICD-10-CM

## 2017-06-21 DIAGNOSIS — Z66 Do not resuscitate: Secondary | ICD-10-CM | POA: Diagnosis present

## 2017-06-21 DIAGNOSIS — Z836 Family history of other diseases of the respiratory system: Secondary | ICD-10-CM

## 2017-06-21 DIAGNOSIS — J441 Chronic obstructive pulmonary disease with (acute) exacerbation: Secondary | ICD-10-CM | POA: Diagnosis present

## 2017-06-21 DIAGNOSIS — I5032 Chronic diastolic (congestive) heart failure: Secondary | ICD-10-CM | POA: Diagnosis present

## 2017-06-21 DIAGNOSIS — I4891 Unspecified atrial fibrillation: Secondary | ICD-10-CM

## 2017-06-21 DIAGNOSIS — Z91041 Radiographic dye allergy status: Secondary | ICD-10-CM

## 2017-06-21 DIAGNOSIS — F0280 Dementia in other diseases classified elsewhere without behavioral disturbance: Secondary | ICD-10-CM | POA: Diagnosis present

## 2017-06-21 DIAGNOSIS — I2510 Atherosclerotic heart disease of native coronary artery without angina pectoris: Secondary | ICD-10-CM | POA: Diagnosis present

## 2017-06-21 DIAGNOSIS — G309 Alzheimer's disease, unspecified: Secondary | ICD-10-CM | POA: Diagnosis present

## 2017-06-21 DIAGNOSIS — Z87891 Personal history of nicotine dependence: Secondary | ICD-10-CM

## 2017-06-21 DIAGNOSIS — R569 Unspecified convulsions: Secondary | ICD-10-CM

## 2017-06-21 DIAGNOSIS — I482 Chronic atrial fibrillation: Secondary | ICD-10-CM | POA: Diagnosis present

## 2017-06-21 DIAGNOSIS — M549 Dorsalgia, unspecified: Secondary | ICD-10-CM

## 2017-06-21 DIAGNOSIS — Z79890 Hormone replacement therapy (postmenopausal): Secondary | ICD-10-CM

## 2017-06-21 DIAGNOSIS — I9589 Other hypotension: Secondary | ICD-10-CM | POA: Diagnosis present

## 2017-06-21 DIAGNOSIS — Z9089 Acquired absence of other organs: Secondary | ICD-10-CM

## 2017-06-21 DIAGNOSIS — I959 Hypotension, unspecified: Secondary | ICD-10-CM | POA: Diagnosis present

## 2017-06-21 LAB — CBC WITH DIFFERENTIAL/PLATELET
Basophils Absolute: 0 10*3/uL (ref 0.0–0.1)
Basophils Relative: 0 %
Eosinophils Absolute: 0 10*3/uL (ref 0.0–0.7)
Eosinophils Relative: 0 %
HCT: 50.6 % — ABNORMAL HIGH (ref 36.0–46.0)
HEMOGLOBIN: 16.3 g/dL — AB (ref 12.0–15.0)
LYMPHS ABS: 0.8 10*3/uL (ref 0.7–4.0)
LYMPHS PCT: 6 %
MCH: 28.7 pg (ref 26.0–34.0)
MCHC: 32.2 g/dL (ref 30.0–36.0)
MCV: 89.1 fL (ref 78.0–100.0)
MONOS PCT: 13 %
Monocytes Absolute: 1.8 10*3/uL — ABNORMAL HIGH (ref 0.1–1.0)
NEUTROS ABS: 11 10*3/uL — AB (ref 1.7–7.7)
NEUTROS PCT: 81 %
Platelets: 197 10*3/uL (ref 150–400)
RBC: 5.68 MIL/uL — AB (ref 3.87–5.11)
RDW: 14.9 % (ref 11.5–15.5)
WBC: 13.6 10*3/uL — AB (ref 4.0–10.5)

## 2017-06-21 LAB — I-STAT CHEM 8, ED
BUN: 24 mg/dL — AB (ref 6–20)
CHLORIDE: 101 mmol/L (ref 101–111)
Calcium, Ion: 1.29 mmol/L (ref 1.15–1.40)
Creatinine, Ser: 0.8 mg/dL (ref 0.44–1.00)
Glucose, Bld: 132 mg/dL — ABNORMAL HIGH (ref 65–99)
HCT: 51 % — ABNORMAL HIGH (ref 36.0–46.0)
Hemoglobin: 17.3 g/dL — ABNORMAL HIGH (ref 12.0–15.0)
Potassium: 4 mmol/L (ref 3.5–5.1)
SODIUM: 139 mmol/L (ref 135–145)
TCO2: 29 mmol/L (ref 22–32)

## 2017-06-21 LAB — I-STAT TROPONIN, ED: Troponin i, poc: 0.01 ng/mL (ref 0.00–0.08)

## 2017-06-21 LAB — URINALYSIS, ROUTINE W REFLEX MICROSCOPIC
Bacteria, UA: NONE SEEN
Bilirubin Urine: NEGATIVE
GLUCOSE, UA: NEGATIVE mg/dL
Hgb urine dipstick: NEGATIVE
Ketones, ur: NEGATIVE mg/dL
Nitrite: NEGATIVE
Protein, ur: NEGATIVE mg/dL
SPECIFIC GRAVITY, URINE: 1.027 (ref 1.005–1.030)
pH: 5 (ref 5.0–8.0)

## 2017-06-21 LAB — BRAIN NATRIURETIC PEPTIDE: B NATRIURETIC PEPTIDE 5: 47 pg/mL (ref 0.0–100.0)

## 2017-06-21 LAB — LACTIC ACID, PLASMA
Lactic Acid, Venous: 1.4 mmol/L (ref 0.5–1.9)
Lactic Acid, Venous: 1.7 mmol/L (ref 0.5–1.9)

## 2017-06-21 MED ORDER — IPRATROPIUM BROMIDE 0.02 % IN SOLN
1.0000 mg | Freq: Once | RESPIRATORY_TRACT | Status: AC
  Administered 2017-06-21: 1 mg via RESPIRATORY_TRACT
  Filled 2017-06-21: qty 5

## 2017-06-21 MED ORDER — METHYLPREDNISOLONE SODIUM SUCC 125 MG IJ SOLR
125.0000 mg | Freq: Once | INTRAMUSCULAR | Status: AC
  Administered 2017-06-21: 125 mg via INTRAVENOUS
  Filled 2017-06-21: qty 2

## 2017-06-21 MED ORDER — MORPHINE SULFATE (PF) 2 MG/ML IV SOLN
0.5000 mg | INTRAVENOUS | Status: DC | PRN
  Administered 2017-06-21: 0.5 mg via INTRAVENOUS
  Filled 2017-06-21: qty 1

## 2017-06-21 MED ORDER — DILTIAZEM HCL-DEXTROSE 100-5 MG/100ML-% IV SOLN (PREMIX)
5.0000 mg/h | INTRAVENOUS | Status: DC
  Administered 2017-06-21: 5 mg/h via INTRAVENOUS
  Filled 2017-06-21: qty 100

## 2017-06-21 MED ORDER — SODIUM CHLORIDE 0.9 % IV BOLUS (SEPSIS)
250.0000 mL | Freq: Once | INTRAVENOUS | Status: AC
  Administered 2017-06-22: 250 mL via INTRAVENOUS

## 2017-06-21 MED ORDER — ALBUTEROL (5 MG/ML) CONTINUOUS INHALATION SOLN
10.0000 mg/h | INHALATION_SOLUTION | Freq: Once | RESPIRATORY_TRACT | Status: AC
  Administered 2017-06-21: 10 mg/h via RESPIRATORY_TRACT
  Filled 2017-06-21: qty 20

## 2017-06-21 MED ORDER — DILTIAZEM LOAD VIA INFUSION
10.0000 mg | Freq: Once | INTRAVENOUS | Status: AC
  Administered 2017-06-21: 3 mg via INTRAVENOUS
  Filled 2017-06-21: qty 10

## 2017-06-21 NOTE — ED Triage Notes (Signed)
[facility] for c/o upper back pain, cough, sob.  Pt states these complaints worsening over past several days.

## 2017-06-21 NOTE — ED Notes (Signed)
EKG done and seen by Dr Hyacinth MeekerMiller. Patient on cardiac monitoring

## 2017-06-21 NOTE — ED Provider Notes (Signed)
[facility] EMERGENCY DEPARTMENT Provider Note   CSN: [id number] Arrival date & time: 06/21/17  2040     History   Chief Complaint Chief Complaint  Patient presents with  . Shortness of Breath    HPI Deborah [name] is a 84 y.o. female.  The history is provided by the patient, a relative, the nursing home and the EMS personnel. The history is limited by the condition of the patient (Hx dementia).  Shortness of Breath    Pt was seen at 2100.  Per EMS, NH report, pt and family:  Pt with persistent SOB, cough, and wheezing for the past 2 weeks, worse over the past several days. Pt has now been on 2 courses of steroids and antibiotics without improvement. Pt also has been c/o mid-back "pains" for the past 2 weeks. Pt's daughter states pt was dx by NH MD with "muscle spasms." Pt herself denies CP/palpitations, no abd pain, no N/V/D. Pt has significant hx of dementia.    Past Medical History:  Diagnosis Date  . Alzheimer disease   . Asthma   . Atrial fibrillation (HCC)    Permanent  . Chronic anticoagulation    Xarelto discontinued after fall  . Chronic diastolic heart failure (HCC)   . COPD (chronic obstructive pulmonary disease) (HCC)   . Coronary atherosclerosis of native coronary artery    BMS circumflex 2009  . Dementia without behavioral disturbance   . Drug intolerance    Flecacinide and Amiodarone   . Fracture of multiple pubic rami (HCC) 12/13   Left superior and inferior - following fall  . Hypertension   . Hypotension   . Mixed hyperlipidemia   . Osteoarthritis   . Retroperitoneal hemorrhage 12/13   Following fall  . Seizures (HCC)    Remote history of over 20 years ago  . Umbilical hernia     Patient Active Problem List   Diagnosis Date Noted  . Screening for colorectal cancer 03/24/2017  . Hemorrhoids 03/24/2017  . Itching in the vaginal area 03/24/2017  . Vulval thrush 03/24/2017  . Hypoxemia   . COPD with acute exacerbation (HCC) 09/13/2016  .  Alzheimer disease 09/13/2016  . Seizures (HCC) 09/13/2016  . Hypokalemia 09/13/2016  . Atrial fibrillation with RVR (HCC) 09/13/2016  . COPD exacerbation (HCC) 09/13/2016  . Hypotension 04/22/2012  . Coronary atherosclerosis of native coronary artery   . COPD (chronic obstructive pulmonary disease) (HCC)   . Mixed hyperlipidemia 05/14/2009  . Permanent atrial fibrillation (HCC) 02/10/2009  . DIASTOLIC HEART FAILURE, CHRONIC 02/10/2009    Past Surgical History:  Procedure Laterality Date  . CATARACT EXTRACTION    . Radiofrequency catheter ablation     Failed  . Right carpel tunnel release    . TONSILLECTOMY      OB History    Gravida Para Term Preterm AB Living   1 1 1     1    SAB TAB Ectopic Multiple Live Births           1       Home Medications    Prior to Admission medications   Medication Sig Start Date End Date Taking? Authorizing Provider  acetaminophen (TYLENOL) 500 MG tablet Take 500 mg by mouth every 6 (six) hours as needed for mild pain. For pain    Yes [provider]  albuterol (PROVENTIL HFA;VENTOLIN HFA) 108 (90 Base) MCG/ACT inhaler Inhale 2 puffs into the lungs every 6 (six) hours as needed for wheezing or shortness  of breath.   Yes [provider]  albuterol (PROVENTIL) (2.5 MG/3ML) 0.083% nebulizer solution Take 2.5 mg by nebulization every 6 (six) hours as needed for wheezing or shortness of breath.    Yes [provider]  alendronate (FOSAMAX) 70 MG tablet Take 70 mg by mouth every Wednesday. Take with a full glass of water on an empty stomach.    Yes [provider]  aspirin (ASPIRIN EC) 81 MG EC tablet Take 1 tablet (81 mg total) by mouth daily. Swallow whole. 05/04/12  Yes Elliot CousinFisher, Denise, MD  atorvastatin (LIPITOR) 40 MG tablet Take 40 mg by mouth. Monday, Wednesday, & Friday evening.   Yes [provider]  calcium citrate-vitamin D 500-400 MG-UNIT chewable tablet Chew 1 tablet by mouth 2 (two) times daily.    Yes [provider]  digoxin (LANOXIN) 0.125 MG tablet Take 1 tablet (0.125 mg total) by mouth every evening. 09/21/16  Yes Philip AspenHernandez Acosta, Limmie PatriciaEstela Y, MD  diltiazem (CARDIZEM) 30 MG tablet Take 1 tablet (30 mg total) by mouth every 6 (six) hours. 09/21/16  Yes Philip AspenHernandez Acosta, Limmie PatriciaEstela Y, MD  docusate sodium (COLACE) 100 MG capsule Take 100 mg by mouth at bedtime.   Yes [provider]  fluticasone (FLONASE) 50 MCG/ACT nasal spray Place 2 sprays into both nostrils daily.   Yes [provider]  furosemide (LASIX) 20 MG tablet Take 10 mg by mouth 2 (two) times daily.    Yes [provider]  guaiFENesin (MUCINEX) 600 MG 12 hr tablet Take 2 tablets (1,200 mg total) by mouth 2 (two) times daily. 09/21/16  Yes Philip AspenHernandez Acosta, Limmie PatriciaEstela Y, MD  levETIRAcetam (KEPPRA) 250 MG tablet Take 125-250 mg by mouth See admin instructions. Takes one tablet in the morning and half a tablet at 3 pm.   Yes [provider]  levothyroxine (SYNTHROID, LEVOTHROID) 25 MCG tablet Take 25 mcg by mouth daily before breakfast.   Yes [provider]  Menthol (ICY HOT) 5 % PTCH Apply 1 application topically daily. Apply to top of back   Yes [provider]  midodrine (PROAMATINE) 2.5 MG tablet Take 1 tablet (2.5 mg total) by mouth 2 (two) times daily with a meal. The dosing can be titrated down to once daily when her systolic blood pressure is consistently in the 100s. 05/04/12  Yes Elliot CousinFisher, Denise, MD  Mineral Oil-Oleth-Polysorbate (MIN-O-EAR) LIQD Place 2 drops in ear(s) every Monday. At bedtime for stopped up ears   Yes [provider]  mometasone (ELOCON) 0.1 % cream Apply 1 application topically daily. To ears for eczema   Yes [provider]  Multiple Vitamins-Minerals (ICAPS) CAPS Take 1 capsule by mouth 2 (two) times daily.   Yes [provider]  omeprazole (PRILOSEC OTC) 20 MG tablet Take 20 mg by mouth. Monday, Wednesday, & Friday evening.    Yes [provider]  potassium chloride (KLOR-CON) 10 MEQ CR tablet Take 10 mEq by mouth daily.     Yes [provider]  Pramox-PE-Glycerin-Petrolatum (PREPARATION H) 1-0.25-14.4-15 % CREA Use daily prn Patient taking differently: Place 1 application rectally daily as needed (for hemorrhoids). Use daily prn 03/24/17  Yes Adline PotterGriffin, Jennifer A, NP  Probiotic Product (PROBIOTIC DAILY PO) Take 1 tablet by mouth every morning.   Yes [provider]  moxifloxacin (AVELOX) 400 MG tablet Take 400 mg by mouth daily. 7 day course completed on 06/19/2017 06/12/17   [provider]  nitroGLYCERIN (NITROSTAT) 0.4 MG SL tablet Place 0.4  mg under the tongue every 5 (five) minutes as needed for chest pain.    [provider]    Family History Family History  Problem Relation Age of Onset  . Coronary artery disease Mother   . Tuberculosis Paternal Grandfather   . Heart attack Maternal Grandmother     Social History Social History   Tobacco Use  . Smoking status: Former Smoker    Packs/day: 0.80    Years: 20.00    Pack years: 16.00    Types: Cigarettes    Last attempt to quit: 05/23/1990    Years since quitting: 27.0  . Smokeless tobacco: Never Used  Substance Use Topics  . Alcohol use: No    Alcohol/week: 0.0 oz  . Drug use: No     Allergies   Ivp dye [iodinated diagnostic agents]; Omnipaque [iohexol]; Sulfa antibiotics; Carbamazepine; Dilaudid [hydromorphone]; and Tramadol   Review of Systems Review of Systems  Unable to perform ROS: Dementia  Respiratory: Positive for shortness of breath.      Physical Exam Updated Vital Signs BP 114/85   Pulse (!) 127   Temp 98.3 F (36.8 C) (Oral)   Resp (!) 32   Ht 5\' 5"  (1.651 m)   Wt 68 kg (150 lb)   SpO2 100%   BMI 24.96 kg/m    Patient Vitals for the past 24 hrs:  BP Temp Temp src Pulse Resp SpO2 Height Weight  06/22/17 0015 (!) 89/75 - - (!) 123 (!) 26 92 % - -  06/21/17 2354 (!) 91/59 -  - (!) 136 (!) 28 94 % - -  06/21/17 2351 (!) 81/70 - - - (!) 29 - - -  06/21/17 2330 128/79 - - - (!) 24 - - -  06/21/17 2300 114/85 - - (!) 127 (!) 32 100 % - -  06/21/17 2230 (!) 141/79 - - (!) 117 (!) 26 94 % - -  06/21/17 2146 - - - - - 98 % - -  06/21/17 2053 (!) 118/92 98.3 F (36.8 C) Oral (!) 116 (!) 27 100 % - -  06/21/17 2045 - - - - - - 5\' 5"  (1.651 m) 68 kg (150 lb)     Physical Exam 2105:  Physical examination:  Nursing notes reviewed; Vital signs and O2 SAT reviewed;  Constitutional: Well developed, Well nourished, Well hydrated, Uncomfortable appearing.;; Head:  Normocephalic, atraumatic; Eyes: EOMI, PERRL, No scleral icterus; ENMT: Mouth and pharynx normal, Mucous membranes moist; Neck: Supple, Full range of motion, No lymphadenopathy; Cardiovascular: Tachycardic irregular rate and rhythm, No gallop; Respiratory: Breath sounds diminished & equal bilaterally, insp/exp wheezes bilat. No audible wheezing.  Speaking short sentences, sitting upright, tachypneic.; Chest: Nontender, Movement normal; Abdomen: Soft, Nontender, Nondistended, Normal bowel sounds; Genitourinary: No CVA tenderness; Spine:  No midline CS, LS tenderness. +TTP bilat thoracic paraspinal muscles, +TTP midline mid-TS. No rash, no step offs.;; Extremities: Pulses normal, No tenderness, No edema, No calf edema or asymmetry.; Neuro: Awake, alert, confused per hx dementia.  Major CN grossly intact. No facial droop. Speech clear. No gross focal motor deficits in extremities.; Skin: Color normal, Warm, Dry.    ED Treatments / Results  Labs (all labs ordered are listed, but only abnormal results are displayed)   EKG  EKG Interpretation  Date/Time:  Wednesday June 21 2017 20:47:29 EST Ventricular Rate:  90 PR Interval:    QRS Duration: 86 QT Interval:  281 QTC Calculation: 344 R Axis:   57 Text Interpretation:  Atrial fibrillation Repol abnrm, severe global ischemia (LM/MVD) Since last tracing rate slower  Confirmed by Eber Hong ([id number]) on 06/21/2017 8:49:55 PM       Radiology   Procedures Procedures (including critical care time)  Medications Ordered in ED Medications  morphine 2 MG/ML injection 0.5 mg (not administered)  diltiazem (CARDIZEM) 1 mg/mL load via infusion 10 mg (not administered)    And  diltiazem (CARDIZEM) 100 mg in dextrose 5% (1 mg/mL) infusion (not administered)  albuterol (PROVENTIL,VENTOLIN) solution continuous neb (10 mg/hr Nebulization Given 06/21/17 2146)  ipratropium (ATROVENT) nebulizer solution 1 mg (1 mg Nebulization Given 06/21/17 2146)  methylPREDNISolone sodium succinate (SOLU-MEDROL) 125 mg/2 mL injection 125 mg (125 mg Intravenous Given 06/21/17 2248)     Initial Impression / Assessment and Plan / ED Course  I have reviewed the triage vital signs and the nursing notes.  Pertinent labs & imaging results that were available during my care of the patient were reviewed by me and considered in my medical decision making (see chart for details).  MDM Reviewed: previous chart, nursing note and vitals Reviewed previous: labs and ECG Interpretation: labs, x-ray and ECG Total time providing critical care: 30-74 minutes. This excludes time spent performing separately reportable procedures and services. Consults: admitting MD    CRITICAL CARE Performed by: Laray Anger Total critical care time: 35 minutes Critical care time was exclusive of separately billable procedures and treating other patients. Critical care was necessary to treat or prevent imminent or life-threatening deterioration. Critical care was time spent personally by me on the following activities: development of treatment plan with patient and/or surrogate as well as nursing, discussions with consultants, evaluation of patient's response to treatment, examination of patient, obtaining history from patient or surrogate, ordering and performing treatments and interventions,  ordering and review of laboratory studies, ordering and review of radiographic studies, pulse oximetry and re-evaluation of patient's condition.  Results for orders placed or performed during the hospital encounter of 06/21/17  Brain natriuretic peptide  Result Value Ref Range   B Natriuretic Peptide 47.0 0.0 - 100.0 pg/mL  Lactic acid, plasma  Result Value Ref Range   Lactic Acid, Venous 1.4 0.5 - 1.9 mmol/L  Lactic acid, plasma  Result Value Ref Range   Lactic Acid, Venous 1.7 0.5 - 1.9 mmol/L  CBC with Differential  Result Value Ref Range   WBC 13.6 (H) 4.0 - 10.5 K/uL   RBC 5.68 (H) 3.87 - 5.11 MIL/uL   Hemoglobin 16.3 (H) 12.0 - 15.0 g/dL   HCT 19.1 (H) 47.8 - 29.5 %   MCV 89.1 78.0 - 100.0 fL   MCH 28.7 26.0 - 34.0 pg   MCHC 32.2 30.0 - 36.0 g/dL   RDW 62.1 30.8 - 65.7 %   Platelets 197 150 - 400 K/uL   Neutrophils Relative % 81 %   Neutro Abs 11.0 (H) 1.7 - 7.7 K/uL   Lymphocytes Relative 6 %   Lymphs Abs 0.8 0.7 - 4.0 K/uL   Monocytes Relative 13 %   Monocytes Absolute 1.8 (H) 0.1 - 1.0 K/uL   Eosinophils Relative 0 %   Eosinophils Absolute 0.0 0.0 - 0.7 K/uL   Basophils Relative 0 %   Basophils Absolute 0.0 0.0 - 0.1 K/uL  Urinalysis, Routine w reflex microscopic  Result Value Ref Range   Color, Urine YELLOW YELLOW   APPearance HAZY (A) CLEAR   Specific Gravity, Urine 1.027 1.005 - 1.030   pH 5.0 5.0 - 8.0  Glucose, UA NEGATIVE NEGATIVE mg/dL   Hgb urine dipstick NEGATIVE NEGATIVE   Bilirubin Urine NEGATIVE NEGATIVE   Ketones, ur NEGATIVE NEGATIVE mg/dL   Protein, ur NEGATIVE NEGATIVE mg/dL   Nitrite NEGATIVE NEGATIVE   Leukocytes, UA LARGE (A) NEGATIVE   RBC / HPF 0-5 0 - 5 RBC/hpf   WBC, UA TOO NUMEROUS TO COUNT 0 - 5 WBC/hpf   Bacteria, UA NONE SEEN NONE SEEN   Squamous Epithelial / LPF 0-5 (A) NONE SEEN   Mucus PRESENT    Hyaline Casts, UA PRESENT    Dg Chest 2 View Result Date: 06/21/2017 CLINICAL DATA:  Cough, shortness of Breath EXAM: CHEST  2  VIEW COMPARISON:  12/04/2016 FINDINGS: Cardiomegaly. Platelike atelectasis in the right mid lung. Left lung clear. No effusions or edema. No acute bony abnormality. IMPRESSION: Right midlung atelectasis.  Cardiomegaly. Electronically Signed   By: Charlett Nose M.D.   On: 06/21/2017 22:34   Dg Thoracic Spine W/swimmers Result Date: 06/21/2017 CLINICAL DATA:  Upper back pain EXAM: THORACIC SPINE - 3 VIEWS COMPARISON:  Chest x-ray today and 12/04/2016. Two-view chest x-ray 09/10/2016 FINDINGS: Multiple moderate compression fractures in the midthoracic spine, likely T6-T8. The T7 compression fracture has progressed since prior study. The other compression fractures are stable. Stable mild compression fracture at T10 and superior endplate of T11, stable. No acute fracture. IMPRESSION: Chronic compression fractures in the mid and lower thoracic spine, with slight worsening at T7. Otherwise no change. Electronically Signed   By: Charlett Nose M.D.   On: 06/21/2017 22:36    0010:  On arrival: pt sitting upright, tachypneic, tachycardic, Sats 94 % on O2 2L N/C, lungs diminished with wheezing. IV solumedrol and hour long neb started. After neb: pt appears more comfortable at rest, Sats 92-98 % on O2 2L N/C, lungs continue diminished. Pt's lungs continue with insp/exp wheezes bilat, remains mildly tachypneic and now tachycardic with HR's to 140's during my re-check. Long d/w pt's daughter: now finally agreeable to pain meds. Will give very low dose pain meds, IV cardizem bolus/gtt, admit for COPD exacerbation and afib/RVR. Udip with WBC's but no bacteria or nitrites; UC is pending.    0015:  As IV cardizem bolus started, SBP dropped into 80's. Judicious IVF bolus given with SBP slowly improving into 90's. Will d/c IV cardizem bolus and begin gtt at low dose, as HR is now decreasing into 110's-120's. T/C to [facility] Dr. Sherryll Burger, case discussed, including:  HPI, pertinent PM/SHx, VS/PE, dx testing, ED course and treatment:   Aware pt is receiving IVF bolus and IV cardizem gtt (slower gtt rate, and no bolus), agreeable to admit.      Final Clinical Impressions(s) / ED Diagnoses   Final diagnoses:  Back pain    ED Discharge Orders    None        Samuel Jester, DO 06/24/17 1057

## 2017-06-22 ENCOUNTER — Other Ambulatory Visit: Payer: Self-pay

## 2017-06-22 DIAGNOSIS — F0280 Dementia in other diseases classified elsewhere without behavioral disturbance: Secondary | ICD-10-CM | POA: Diagnosis present

## 2017-06-22 DIAGNOSIS — E782 Mixed hyperlipidemia: Secondary | ICD-10-CM | POA: Diagnosis present

## 2017-06-22 DIAGNOSIS — M4854XA Collapsed vertebra, not elsewhere classified, thoracic region, initial encounter for fracture: Secondary | ICD-10-CM | POA: Diagnosis present

## 2017-06-22 DIAGNOSIS — Z885 Allergy status to narcotic agent status: Secondary | ICD-10-CM | POA: Diagnosis not present

## 2017-06-22 DIAGNOSIS — Z7982 Long term (current) use of aspirin: Secondary | ICD-10-CM | POA: Diagnosis not present

## 2017-06-22 DIAGNOSIS — I4891 Unspecified atrial fibrillation: Secondary | ICD-10-CM | POA: Diagnosis not present

## 2017-06-22 DIAGNOSIS — Z66 Do not resuscitate: Secondary | ICD-10-CM | POA: Diagnosis present

## 2017-06-22 DIAGNOSIS — R569 Unspecified convulsions: Secondary | ICD-10-CM | POA: Diagnosis present

## 2017-06-22 DIAGNOSIS — M549 Dorsalgia, unspecified: Secondary | ICD-10-CM | POA: Diagnosis present

## 2017-06-22 DIAGNOSIS — I959 Hypotension, unspecified: Secondary | ICD-10-CM

## 2017-06-22 DIAGNOSIS — E039 Hypothyroidism, unspecified: Secondary | ICD-10-CM | POA: Diagnosis present

## 2017-06-22 DIAGNOSIS — Z9089 Acquired absence of other organs: Secondary | ICD-10-CM | POA: Diagnosis not present

## 2017-06-22 DIAGNOSIS — I110 Hypertensive heart disease with heart failure: Secondary | ICD-10-CM | POA: Diagnosis present

## 2017-06-22 DIAGNOSIS — Z836 Family history of other diseases of the respiratory system: Secondary | ICD-10-CM | POA: Diagnosis not present

## 2017-06-22 DIAGNOSIS — I2510 Atherosclerotic heart disease of native coronary artery without angina pectoris: Secondary | ICD-10-CM | POA: Diagnosis present

## 2017-06-22 DIAGNOSIS — Z882 Allergy status to sulfonamides status: Secondary | ICD-10-CM | POA: Diagnosis not present

## 2017-06-22 DIAGNOSIS — S22000A Wedge compression fracture of unspecified thoracic vertebra, initial encounter for closed fracture: Secondary | ICD-10-CM

## 2017-06-22 DIAGNOSIS — Z888 Allergy status to other drugs, medicaments and biological substances status: Secondary | ICD-10-CM | POA: Diagnosis not present

## 2017-06-22 DIAGNOSIS — Z87891 Personal history of nicotine dependence: Secondary | ICD-10-CM | POA: Diagnosis not present

## 2017-06-22 DIAGNOSIS — Z9849 Cataract extraction status, unspecified eye: Secondary | ICD-10-CM | POA: Diagnosis not present

## 2017-06-22 DIAGNOSIS — J441 Chronic obstructive pulmonary disease with (acute) exacerbation: Secondary | ICD-10-CM | POA: Diagnosis not present

## 2017-06-22 DIAGNOSIS — Z8701 Personal history of pneumonia (recurrent): Secondary | ICD-10-CM | POA: Diagnosis not present

## 2017-06-22 DIAGNOSIS — Z8249 Family history of ischemic heart disease and other diseases of the circulatory system: Secondary | ICD-10-CM | POA: Diagnosis not present

## 2017-06-22 DIAGNOSIS — I9589 Other hypotension: Secondary | ICD-10-CM | POA: Diagnosis present

## 2017-06-22 DIAGNOSIS — I482 Chronic atrial fibrillation: Secondary | ICD-10-CM | POA: Diagnosis not present

## 2017-06-22 DIAGNOSIS — G309 Alzheimer's disease, unspecified: Secondary | ICD-10-CM | POA: Diagnosis present

## 2017-06-22 DIAGNOSIS — I5032 Chronic diastolic (congestive) heart failure: Secondary | ICD-10-CM | POA: Diagnosis present

## 2017-06-22 DIAGNOSIS — Z79890 Hormone replacement therapy (postmenopausal): Secondary | ICD-10-CM | POA: Diagnosis not present

## 2017-06-22 LAB — TSH: TSH: 5.004 u[IU]/mL — ABNORMAL HIGH (ref 0.350–4.500)

## 2017-06-22 LAB — MAGNESIUM: Magnesium: 2.3 mg/dL (ref 1.7–2.4)

## 2017-06-22 LAB — BASIC METABOLIC PANEL WITH GFR
Anion gap: 15 (ref 5–15)
BUN: 25 mg/dL — ABNORMAL HIGH (ref 6–20)
CO2: 28 mmol/L (ref 22–32)
Calcium: 11.3 mg/dL — ABNORMAL HIGH (ref 8.9–10.3)
Chloride: 97 mmol/L — ABNORMAL LOW (ref 101–111)
Creatinine, Ser: 0.81 mg/dL (ref 0.44–1.00)
GFR calc Af Amer: >60 mL/min
GFR calc non Af Amer: >60 mL/min
Glucose, Bld: 132 mg/dL — ABNORMAL HIGH (ref 65–99)
Potassium: 4.8 mmol/L (ref 3.5–5.1)
Sodium: 140 mmol/L (ref 135–145)

## 2017-06-22 LAB — TROPONIN I: Troponin I: <0.03 ng/mL

## 2017-06-22 LAB — DIGOXIN LEVEL: Digoxin Level: 0.9 ng/mL (ref 0.8–2.0)

## 2017-06-22 LAB — MRSA PCR SCREENING: MRSA BY PCR: NEGATIVE

## 2017-06-22 MED ORDER — LEVOTHYROXINE SODIUM 25 MCG PO TABS
25.0000 ug | ORAL_TABLET | Freq: Every day | ORAL | Status: DC
  Administered 2017-06-22 – 2017-06-23 (×2): 25 ug via ORAL
  Filled 2017-06-22 (×2): qty 1

## 2017-06-22 MED ORDER — SODIUM CHLORIDE 0.9% FLUSH
3.0000 mL | Freq: Two times a day (BID) | INTRAVENOUS | Status: DC
  Administered 2017-06-22 – 2017-06-23 (×4): 3 mL via INTRAVENOUS

## 2017-06-22 MED ORDER — AZITHROMYCIN 250 MG PO TABS
250.0000 mg | ORAL_TABLET | Freq: Every day | ORAL | Status: DC
  Administered 2017-06-23: 250 mg via ORAL
  Filled 2017-06-22: qty 1

## 2017-06-22 MED ORDER — ALENDRONATE SODIUM 70 MG PO TABS: 70.0000 mg | ORAL_TABLET | ORAL | Status: DC

## 2017-06-22 MED ORDER — AZITHROMYCIN 250 MG PO TABS
500.0000 mg | ORAL_TABLET | Freq: Once | ORAL | Status: AC
  Administered 2017-06-22: 500 mg via ORAL
  Filled 2017-06-22: qty 2

## 2017-06-22 MED ORDER — FUROSEMIDE 20 MG PO TABS
10.0000 mg | ORAL_TABLET | Freq: Two times a day (BID) | ORAL | Status: DC
  Administered 2017-06-22 – 2017-06-23 (×3): 10 mg via ORAL
  Filled 2017-06-22 (×3): qty 1

## 2017-06-22 MED ORDER — POTASSIUM CHLORIDE CRYS ER 10 MEQ PO TBCR
10.0000 meq | EXTENDED_RELEASE_TABLET | Freq: Every day | ORAL | Status: DC
  Administered 2017-06-22 – 2017-06-23 (×2): 10 meq via ORAL
  Filled 2017-06-22 (×2): qty 1

## 2017-06-22 MED ORDER — ATORVASTATIN CALCIUM 40 MG PO TABS: 40.0000 mg | ORAL_TABLET | ORAL | Status: DC

## 2017-06-22 MED ORDER — MENTHOL (TOPICAL ANALGESIC) 5 % EX PTCH: 1.0000 "application " | MEDICATED_PATCH | Freq: Every day | CUTANEOUS | Status: DC

## 2017-06-22 MED ORDER — ASPIRIN EC 81 MG PO TBEC
81.0000 mg | DELAYED_RELEASE_TABLET | Freq: Every day | ORAL | Status: DC
  Administered 2017-06-22 – 2017-06-23 (×2): 81 mg via ORAL
  Filled 2017-06-22 (×4): qty 1

## 2017-06-22 MED ORDER — MIDODRINE HCL 5 MG PO TABS
2.5000 mg | ORAL_TABLET | Freq: Two times a day (BID) | ORAL | Status: DC
  Administered 2017-06-22 – 2017-06-23 (×3): 2.5 mg via ORAL
  Filled 2017-06-22 (×8): qty 1

## 2017-06-22 MED ORDER — IPRATROPIUM BROMIDE 0.02 % IN SOLN
0.5000 mg | Freq: Four times a day (QID) | RESPIRATORY_TRACT | Status: DC
  Administered 2017-06-22 – 2017-06-23 (×5): 0.5 mg via RESPIRATORY_TRACT
  Filled 2017-06-22 (×5): qty 2.5

## 2017-06-22 MED ORDER — SODIUM CHLORIDE 0.9% FLUSH: 3.0000 mL | INTRAVENOUS | Status: DC | PRN

## 2017-06-22 MED ORDER — DIGOXIN 125 MCG PO TABS
0.1250 mg | ORAL_TABLET | Freq: Every evening | ORAL | Status: DC
  Administered 2017-06-22: 0.125 mg via ORAL
  Filled 2017-06-22: qty 1

## 2017-06-22 MED ORDER — ENOXAPARIN SODIUM 40 MG/0.4ML ~~LOC~~ SOLN
40.0000 mg | SUBCUTANEOUS | Status: DC
  Administered 2017-06-22 – 2017-06-23 (×2): 40 mg via SUBCUTANEOUS
  Filled 2017-06-22 (×2): qty 0.4

## 2017-06-22 MED ORDER — DOCUSATE SODIUM 100 MG PO CAPS
100.0000 mg | ORAL_CAPSULE | Freq: Every day | ORAL | Status: DC
  Administered 2017-06-22: 100 mg via ORAL
  Filled 2017-06-22: qty 1

## 2017-06-22 MED ORDER — MUSCLE RUB 10-15 % EX CREA
TOPICAL_CREAM | CUTANEOUS | Status: DC | PRN
  Administered 2017-06-22: 1 via TOPICAL
  Filled 2017-06-22: qty 85

## 2017-06-22 MED ORDER — OMEPRAZOLE MAGNESIUM 20 MG PO TBEC: 20.0000 mg | DELAYED_RELEASE_TABLET | ORAL | Status: DC

## 2017-06-22 MED ORDER — ONDANSETRON HCL 4 MG/2ML IJ SOLN: 4.0000 mg | Freq: Four times a day (QID) | INTRAMUSCULAR | Status: DC | PRN

## 2017-06-22 MED ORDER — ONDANSETRON HCL 4 MG PO TABS: 4.0000 mg | ORAL_TABLET | Freq: Four times a day (QID) | ORAL | Status: DC | PRN

## 2017-06-22 MED ORDER — NITROGLYCERIN 0.4 MG SL SUBL: 0.4000 mg | SUBLINGUAL_TABLET | SUBLINGUAL | Status: DC | PRN

## 2017-06-22 MED ORDER — LEVALBUTEROL HCL 0.63 MG/3ML IN NEBU
0.6300 mg | INHALATION_SOLUTION | Freq: Four times a day (QID) | RESPIRATORY_TRACT | Status: DC
  Administered 2017-06-22 – 2017-06-23 (×5): 0.63 mg via RESPIRATORY_TRACT
  Filled 2017-06-22 (×5): qty 3

## 2017-06-22 MED ORDER — PREDNISONE 20 MG PO TABS
40.0000 mg | ORAL_TABLET | Freq: Every day | ORAL | Status: DC
  Administered 2017-06-22 – 2017-06-23 (×2): 40 mg via ORAL
  Filled 2017-06-22 (×2): qty 2

## 2017-06-22 MED ORDER — MOMETASONE FUROATE 0.1 % EX CREA
1.0000 "application " | TOPICAL_CREAM | Freq: Every day | CUTANEOUS | Status: DC
  Administered 2017-06-23: 1 via TOPICAL
  Filled 2017-06-22: qty 15

## 2017-06-22 MED ORDER — CALCIUM CITRATE-VITAMIN D 500-400 MG-UNIT PO CHEW
1.0000 | CHEWABLE_TABLET | Freq: Two times a day (BID) | ORAL | Status: DC
  Filled 2017-06-22 (×3): qty 1

## 2017-06-22 MED ORDER — CALCIUM-VITAMIN D 500-200 MG-UNIT PO TABS
1.0000 | ORAL_TABLET | Freq: Two times a day (BID) | ORAL | Status: DC
  Administered 2017-06-22: 1 via ORAL
  Filled 2017-06-22 (×9): qty 1

## 2017-06-22 MED ORDER — LEVETIRACETAM 250 MG PO TABS
250.0000 mg | ORAL_TABLET | Freq: Every day | ORAL | Status: DC
  Administered 2017-06-22 – 2017-06-23 (×2): 250 mg via ORAL
  Filled 2017-06-22 (×2): qty 1

## 2017-06-22 MED ORDER — PANTOPRAZOLE SODIUM 40 MG PO TBEC: 40.0000 mg | DELAYED_RELEASE_TABLET | ORAL | Status: DC

## 2017-06-22 MED ORDER — LEVETIRACETAM 250 MG PO TABS
125.0000 mg | ORAL_TABLET | ORAL | Status: DC
  Administered 2017-06-22: 125 mg via ORAL
  Filled 2017-06-22: qty 1

## 2017-06-22 MED ORDER — OCUVITE-LUTEIN PO CAPS
1.0000 | ORAL_CAPSULE | Freq: Two times a day (BID) | ORAL | Status: DC
  Administered 2017-06-22 – 2017-06-23 (×3): 1 via ORAL
  Filled 2017-06-22 (×6): qty 1

## 2017-06-22 MED ORDER — MIN-O-EAR OT LIQD: 2.0000 [drp] | OTIC | Status: DC

## 2017-06-22 MED ORDER — GUAIFENESIN ER 600 MG PO TB12
1200.0000 mg | ORAL_TABLET | Freq: Two times a day (BID) | ORAL | Status: DC
  Administered 2017-06-22 – 2017-06-23 (×3): 1200 mg via ORAL
  Filled 2017-06-22 (×3): qty 2

## 2017-06-22 MED ORDER — SODIUM CHLORIDE 0.9 % IV SOLN
250.0000 mL | INTRAVENOUS | Status: DC | PRN
  Administered 2017-06-22: 250 mL via INTRAVENOUS

## 2017-06-22 MED ORDER — FLUTICASONE PROPIONATE 50 MCG/ACT NA SUSP
2.0000 | Freq: Every day | NASAL | Status: DC
  Administered 2017-06-22 – 2017-06-23 (×2): 2 via NASAL
  Filled 2017-06-22: qty 16

## 2017-06-22 MED ORDER — ACETAMINOPHEN 500 MG PO TABS: 500.0000 mg | ORAL_TABLET | Freq: Four times a day (QID) | ORAL | Status: DC | PRN

## 2017-06-22 MED ORDER — DILTIAZEM HCL 30 MG PO TABS
30.0000 mg | ORAL_TABLET | Freq: Four times a day (QID) | ORAL | Status: DC
  Administered 2017-06-22 – 2017-06-23 (×5): 30 mg via ORAL
  Filled 2017-06-22 (×5): qty 1

## 2017-06-22 NOTE — Progress Notes (Signed)
Initial Nutrition Assessment  DOCUMENTATION CODES:     INTERVENTION:  Recommend liberalize diet to Regular    NUTRITION DIAGNOSIS:  Inability to manage self care; chronic. Lives in ALF.    GOAL: Pt to meet >/= 90% of their estimated nutrition needs      MONITOR: Po intake, labs and wt trends     REASON FOR ASSESSMENT:   Consult Assessment of nutrition requirement/status, COPD Protocol  ASSESSMENT:  The patient is from ALF. Hx of mild dementia, COPD, HF, HTN and atrial fibrillation. Her daughter is here with her and says pt may be discharged tomorrow. She is being transferred out of the ICU.  [facility] or walk[facility] to ambulate. She is independent with feeding. No problems verbalized with chewing or swallowing. The patient and daughter agree that her appetite is very good and her general eating pattern is 3 meals daily. 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  Her weight is stable between 131-135 lb. Expect pt is meeting her estimated nutrition needs based on her reported intake.     Recent Labs  Lab 06/21/17 2344 06/21/17 2349  NA 140 139  K 4.8 4.0  CL 97* 101  CO2 28  --   BUN 25* 24*  CREATININE 0.81 0.80  CALCIUM 11.3*  --   MG 2.3  --   GLUCOSE 132* 132*  Labs and meds reviewed.   NUTRITION - FOCUSED PHYSICAL EXAM: within normal limits   Diet Order:  Heart Healthy    EDUCATION NEEDS:     Skin:  Skin Assessment: Reviewed RN Assessment  Last BM:  1/30  Height:   Ht Readings from Last 1 Encounters:  06/22/17 5\' 5"  (1.651 m)    Weight:   Wt Readings from Last 1 Encounters:  06/22/17 131 lb 2.8 oz (59.5 kg)    Ideal Body Weight:  57 kg  BMI:  Body mass index is 21.83 kg/m.  Estimated Nutritional Needs:   Kcal:  1440-1600   Protein:  70-75 gr  Fluid:  1.5-1.6 liters daily   Royann ShiversLynn Tymon Nemetz MS,RD,CSG,LDN Office: [phone] Pager: [phone]

## 2017-06-22 NOTE — ED Notes (Signed)
ED TO INPATIENT HANDOFF REPORT  Name/Age/Gender Deborah [name] 84 y.o. female  Code Status    Code Status Orders  (From admission, onward)        Start     Ordered   06/22/17 0051  Do not attempt resuscitation (DNR)  Continuous    Question Answer Comment  In the event of cardiac or respiratory ARREST Do not call a "code blue"   In the event of cardiac or respiratory ARREST Do not perform Intubation, CPR, defibrillation or ACLS   In the event of cardiac or respiratory ARREST Use medication by any route, position, wound care, and other measures to relive pain and suffering. May use oxygen, suction and manual treatment of airway obstruction as needed for comfort.      06/22/17 0055    Code Status History    Date Active Date Inactive Code Status Order ID Comments User Context   12/04/2016 19:35 12/06/2016 14:12 DNR [id number]  Jeryl Columbia, NP Inpatient   12/04/2016 19:14 12/04/2016 19:34 Full Code [id number]  Erline Hau, MD Inpatient   09/13/2016 06:22 09/21/2016 15:23 DNR [id number]  Reubin Milan, MD Inpatient   09/10/2016 20:25 09/11/2016 01:43 DNR [id number]  Fredia Sorrow, MD ED   04/22/2012 00:27 05/04/2012 16:06 Full Code [id number]  Karlyn Agee, MD Inpatient   04/21/2012 23:36 04/22/2012 00:27 DNR [id number]  Karlyn Agee, MD Inpatient   04/21/2012 22:45 04/21/2012 23:36 Full Code [id number]  Elesa Massed, RN Inpatient    Advance Directive Documentation     Most Recent Value  Type of Advance Directive  Out of facility DNR (pink MOST or yellow form)  Pre-existing out of facility DNR order (yellow form or pink MOST form)  Yellow form placed in chart (order not valid for inpatient use)  "MOST" Form in Place?  No data      Home/SNF/Other Skilled nursing facility  Chief Complaint shortness of breath  Level of Care/Admitting Diagnosis ED Disposition    ED Disposition Condition [place]: [facility] [[id number]]  Level  of Care: Stepdown [14]  Diagnosis: Atrial fibrillation with RVR [facility]) [[id number]]  Admitting Physician: Rodena Goldmann [[id number]]  Attending Physician: Heath Lark D [[id number]]  Estimated length of stay: past midnight tomorrow  Certification:: I certify this patient will need inpatient services for at least 2 midnights  PT Class (Do Not Modify): Inpatient [101]  PT Acc Code (Do Not Modify): Private [1]       Medical History Past Medical History:  Diagnosis Date  . Alzheimer disease   . Asthma   . Atrial fibrillation ([place])    Permanent  . Chronic anticoagulation    Xarelto discontinued after fall  . Chronic diastolic heart failure ([place])   . COPD (chronic obstructive pulmonary disease) ([place])   . Coronary atherosclerosis of native coronary artery    BMS circumflex 2009  . Dementia without behavioral disturbance   . Drug intolerance    Flecacinide and Amiodarone   . Fracture of multiple pubic rami ([place]) 12/13   Left superior and inferior - following fall  . Hypertension   . Hypotension   . Mixed hyperlipidemia   . Osteoarthritis   . Retroperitoneal hemorrhage 12/13   Following fall  . Seizures ([place])    Remote history of over 20 years ago  . Umbilical hernia     Allergies Allergies  Allergen Reactions  . Ivp Dye [Iodinated Diagnostic Agents] Shortness Of Breath    ??  .  Omnipaque [Iohexol] Shortness Of Breath and Other (See Comments)    Short of breath with chest tightness after IV injection in CT, pt was fine when she left department but developed symptoms in parking lot and went to emergency department.  . Sulfa Antibiotics Shortness Of Breath  . Carbamazepine     REACTION: toxemia  . Dilaudid [Hydromorphone] Nausea And Vomiting  . Tramadol Other (See Comments)    confusion    IV Location/Drains/Wounds Patient Lines/Drains/Airways Status   Active Line/Drains/Airways    None          Labs/Imaging Results for orders placed or performed during the hospital  encounter of 06/21/17 (from the past 48 hour(s))  Urinalysis, Routine w reflex microscopic     Status: Abnormal   Collection Time: 06/21/17  9:11 PM  Result Value Ref Range   Color, Urine YELLOW YELLOW   APPearance HAZY (A) CLEAR   Specific Gravity, Urine 1.027 1.005 - 1.030   pH 5.0 5.0 - 8.0   Glucose, UA NEGATIVE NEGATIVE mg/dL   Hgb urine dipstick NEGATIVE NEGATIVE   Bilirubin Urine NEGATIVE NEGATIVE   Ketones, ur NEGATIVE NEGATIVE mg/dL   Protein, ur NEGATIVE NEGATIVE mg/dL   Nitrite NEGATIVE NEGATIVE   Leukocytes, UA LARGE (A) NEGATIVE   RBC / HPF 0-5 0 - 5 RBC/hpf   WBC, UA TOO NUMEROUS TO COUNT 0 - 5 WBC/hpf   Bacteria, UA NONE SEEN NONE SEEN   Squamous Epithelial / LPF 0-5 (A) NONE SEEN   Mucus PRESENT    Hyaline Casts, UA PRESENT   Brain natriuretic peptide     Status: None   Collection Time: 06/21/17  9:13 PM  Result Value Ref Range   B Natriuretic Peptide 47.0 0.0 - 100.0 pg/mL  Lactic acid, plasma     Status: None   Collection Time: 06/21/17  9:13 PM  Result Value Ref Range   Lactic Acid, Venous 1.4 0.5 - 1.9 mmol/L  CBC with Differential     Status: Abnormal   Collection Time: 06/21/17  9:13 PM  Result Value Ref Range   WBC 13.6 (H) 4.0 - 10.5 K/uL   RBC 5.68 (H) 3.87 - 5.11 MIL/uL   Hemoglobin 16.3 (H) 12.0 - 15.0 g/dL   HCT 50.6 (H) 36.0 - 46.0 %   MCV 89.1 78.0 - 100.0 fL   MCH 28.7 26.0 - 34.0 pg   MCHC 32.2 30.0 - 36.0 g/dL   RDW 14.9 11.5 - 15.5 %   Platelets 197 150 - 400 K/uL   Neutrophils Relative % 81 %   Neutro Abs 11.0 (H) 1.7 - 7.7 K/uL   Lymphocytes Relative 6 %   Lymphs Abs 0.8 0.7 - 4.0 K/uL   Monocytes Relative 13 %   Monocytes Absolute 1.8 (H) 0.1 - 1.0 K/uL   Eosinophils Relative 0 %   Eosinophils Absolute 0.0 0.0 - 0.7 K/uL   Basophils Relative 0 %   Basophils Absolute 0.0 0.0 - 0.1 K/uL  Lactic acid, plasma     Status: None   Collection Time: 06/21/17 11:00 PM  Result Value Ref Range   Lactic Acid, Venous 1.7 0.5 - 1.9 mmol/L   Basic metabolic panel     Status: Abnormal   Collection Time: 06/21/17 11:44 PM  Result Value Ref Range   Sodium 140 135 - 145 mmol/L   Potassium 4.8 3.5 - 5.1 mmol/L   Chloride 97 (L) 101 - 111 mmol/L   CO2 28 22 - 32 mmol/L  Glucose, Bld 132 (H) 65 - 99 mg/dL   BUN 25 (H) 6 - 20 mg/dL   Creatinine, Ser 0.81 0.44 - 1.00 mg/dL   Calcium 11.3 (H) 8.9 - 10.3 mg/dL   GFR calc non Af Amer >60 >60 mL/min   GFR calc Af Amer >60 >60 mL/min    Comment: (NOTE) The eGFR has been calculated using the CKD EPI equation. This calculation has not been validated in all clinical situations. eGFR's persistently <60 mL/min signify possible Chronic Kidney Disease.    Anion gap 15 5 - 15  Troponin I     Status: None   Collection Time: 06/21/17 11:44 PM  Result Value Ref Range   Troponin I <0.03 <0.03 ng/mL  Magnesium     Status: None   Collection Time: 06/21/17 11:44 PM  Result Value Ref Range   Magnesium 2.3 1.7 - 2.4 mg/dL  I-stat troponin, ED     Status: None   Collection Time: 06/21/17 11:47 PM  Result Value Ref Range   Troponin i, poc 0.01 0.00 - 0.08 ng/mL   Comment 3            Comment: Due to the release kinetics of cTnI, a negative result within the first hours of the onset of symptoms does not rule out myocardial infarction with certainty. If myocardial infarction is still suspected, repeat the test at appropriate intervals.   I-stat Chem 8, ED     Status: Abnormal   Collection Time: 06/21/17 11:49 PM  Result Value Ref Range   Sodium 139 135 - 145 mmol/L   Potassium 4.0 3.5 - 5.1 mmol/L   Chloride 101 101 - 111 mmol/L   BUN 24 (H) 6 - 20 mg/dL   Creatinine, Ser 0.80 0.44 - 1.00 mg/dL   Glucose, Bld 132 (H) 65 - 99 mg/dL   Calcium, Ion 1.29 1.15 - 1.40 mmol/L   TCO2 29 22 - 32 mmol/L   Hemoglobin 17.3 (H) 12.0 - 15.0 g/dL   HCT 51.0 (H) 36.0 - 46.0 %   Dg Chest 2 View  Result Date: 06/21/2017 CLINICAL DATA:  Cough, shortness of Breath EXAM: CHEST  2 VIEW COMPARISON:   12/04/2016 FINDINGS: Cardiomegaly. Platelike atelectasis in the right mid lung. Left lung clear. No effusions or edema. No acute bony abnormality. IMPRESSION: Right midlung atelectasis.  Cardiomegaly. Electronically Signed   By: Rolm Baptise M.D.   On: 06/21/2017 22:34   Dg Thoracic Spine W/swimmers  Result Date: 06/21/2017 CLINICAL DATA:  Upper back pain EXAM: THORACIC SPINE - 3 VIEWS COMPARISON:  Chest x-ray today and 12/04/2016. Two-view chest x-ray 09/10/2016 FINDINGS: Multiple moderate compression fractures in the midthoracic spine, likely T6-T8. The T7 compression fracture has progressed since prior study. The other compression fractures are stable. Stable mild compression fracture at T10 and superior endplate of F63, stable. No acute fracture. IMPRESSION: Chronic compression fractures in the mid and lower thoracic spine, with slight worsening at T7. Otherwise no change. Electronically Signed   By: Rolm Baptise M.D.   On: 06/21/2017 22:36    Pending Labs Unresulted Labs (From admission, onward)   Start     Ordered   06/29/17 0500  Creatinine, serum  (enoxaparin (LOVENOX)    CrCl >/= 30 ml/min)  Weekly,   R    Comments:  while on enoxaparin therapy    06/22/17 0055   06/22/17 0053  TSH  Once,   R     06/22/17 0055   06/22/17 0053  Digoxin level  Once,   R     06/22/17 0055      Vitals/Pain Today's Vitals   06/22/17 0040 06/22/17 0050 06/22/17 0120 06/22/17 0130  BP: (!) 85/60 102/85 97/74 (!) 99/52  Pulse:  (!) 107 (!) 101   Resp: (!) '24 18 19 19  ' Temp:      TempSrc:      SpO2:  93% 93%   Weight:      Height:      PainSc:        Isolation Precautions No active isolations  Medications Medications  diltiazem (CARDIZEM) 1 mg/mL load via infusion 10 mg (3 mg Intravenous Bolus from Bag 06/21/17 2350)    And  diltiazem (CARDIZEM) 100 mg in dextrose 5% 166m (1 mg/mL) infusion (2.5 mg/hr Intravenous Restarted 06/22/17 0017)  acetaminophen (TYLENOL) tablet 500 mg (not  administered)  alendronate (FOSAMAX) tablet 70 mg (not administered)  aspirin EC tablet 81 mg (not administered)  atorvastatin (LIPITOR) tablet 40 mg (not administered)  calcium citrate-vitamin D 500-400 MG-UNIT per chewable tablet 1 tablet (not administered)  digoxin (LANOXIN) tablet 0.125 mg (not administered)  diltiazem (CARDIZEM) tablet 30 mg (not administered)  docusate sodium (COLACE) capsule 100 mg (not administered)  fluticasone (FLONASE) 50 MCG/ACT nasal spray 2 spray (not administered)  furosemide (LASIX) tablet 10 mg (not administered)  guaiFENesin (MUCINEX) 12 hr tablet 1,200 mg (not administered)  levETIRAcetam (KEPPRA) tablet 125-250 mg (not administered)  levothyroxine (SYNTHROID, LEVOTHROID) tablet 25 mcg (not administered)  Menthol 5 % PTCH 1 application (not administered)  midodrine (PROAMATINE) tablet 2.5 mg (not administered)  MIN-O-EAR LIQD 2 drop (not administered)  mometasone (ELOCON) 0.1 % cream 1 application (not administered)  ICAPS CAPS 1 capsule (not administered)  nitroGLYCERIN (NITROSTAT) SL tablet 0.4 mg (not administered)  omeprazole (PRILOSEC OTC) EC tablet 20 mg (not administered)  potassium chloride SA (K-DUR,KLOR-CON) CR tablet 10 mEq (not administered)  enoxaparin (LOVENOX) injection 40 mg (not administered)  sodium chloride flush (NS) 0.9 % injection 3 mL (not administered)  sodium chloride flush (NS) 0.9 % injection 3 mL (not administered)  0.9 %  sodium chloride infusion (not administered)  ondansetron (ZOFRAN) tablet 4 mg (not administered)    Or  ondansetron (ZOFRAN) injection 4 mg (not administered)  azithromycin (ZITHROMAX) tablet 500 mg (not administered)    Followed by  azithromycin (ZITHROMAX) tablet 250 mg (not administered)  predniSONE (DELTASONE) tablet 40 mg (not administered)  ipratropium (ATROVENT) nebulizer solution 0.5 mg (not administered)  levalbuterol (XOPENEX) nebulizer solution 0.63 mg (not administered)  MUSCLE RUB CREA  (not administered)  albuterol (PROVENTIL,VENTOLIN) solution continuous neb (10 mg/hr Nebulization Given 06/21/17 2146)  ipratropium (ATROVENT) nebulizer solution 1 mg (1 mg Nebulization Given 06/21/17 2146)  methylPREDNISolone sodium succinate (SOLU-MEDROL) 125 mg/2 mL injection 125 mg (125 mg Intravenous Given 06/21/17 2248)  sodium chloride 0.9 % bolus 250 mL (0 mLs Intravenous Stopped 06/22/17 0010)    Mobility walks with device

## 2017-06-22 NOTE — Consult Note (Signed)
Cardiology Consultation:   Patient ID: Deborah [name]; [id number]; 1925/09/11   Admit date: 06/21/2017 Date of Consult: 06/22/2017  Primary Care Provider: Ignatius Specking, MD Primary Cardiologist: Dr. Jonelle Sidle   Patient Profile:   Deborah [name] is a 84 y.o. female with a history of chronic atrial fibrillation, CAD status post BMS to the circumflex in 2009, hypotension on midodrine, and hyperlipidemia who is being seen today for the evaluation of atrial fibrillation at the request of Dr. Laural Benes.  History of Present Illness:   [name] is currently admitted to the hospital having presented from assisted living facility to the ER complaining of back pain.  She was treated approximately 1 week ago for upper respiratory tract infection/pneumonia, has had cough with subsequent development of back pain and spasms.  When seen in the ER she was noted to be in rapid atrial fibrillation (also given a breathing treatment for wheezing) and ultimately placed on intravenous diltiazem and admitted to the hospital.  We are asked to assist with her management.  I last saw her in the office in February 2017.  She has generally done well in terms of heart rate management in atrial fibrillation on combination of digoxin and divided dose short acting Cardizem.  Chronic hypotension requiring midodrine has limited titration of therapy to some degree.  Of note, she is not chronically anticoagulated with previous history of falls and retroperitoneal hemorrhage.  Past Medical History:  Diagnosis Date  . Alzheimer disease   . Asthma   . Atrial fibrillation ([facility])    Permanent  . Chronic anticoagulation    Xarelto discontinued after fall  . Chronic diastolic heart failure ([facility])   . COPD (chronic obstructive pulmonary disease) ([facility])   . Coronary atherosclerosis of native coronary artery    BMS circumflex 2009  . Dementia without behavioral disturbance   . Drug intolerance    Flecacinide and Amiodarone    . Fracture of multiple pubic rami ([facility]) 12/13   Left superior and inferior - following fall  . Hypertension   . Hypotension   . Mixed hyperlipidemia   . Osteoarthritis   . Retroperitoneal hemorrhage 12/13   Following fall  . Seizures ([facility])    Remote history of over 20 years ago  . Umbilical hernia     Past Surgical History:  Procedure Laterality Date  . CATARACT EXTRACTION    . Radiofrequency catheter ablation     Failed  . Right carpel tunnel release    . TONSILLECTOMY       Inpatient Medications: Scheduled Meds: . aspirin EC  81 mg Oral Daily  . [START ON 06/23/2017] atorvastatin  40 mg Oral Once per day on Mon Wed Fri  . [START ON 06/23/2017] azithromycin  250 mg Oral Daily  . calcium-vitamin D  1 tablet Oral BID  . digoxin  0.125 mg Oral QPM  . diltiazem  30 mg Oral Q6H  . docusate sodium  100 mg Oral QHS  . enoxaparin (LOVENOX) injection  40 mg Subcutaneous Q24H  . fluticasone  2 spray Each Nare Daily  . furosemide  10 mg Oral BID  . guaiFENesin  1,200 mg Oral BID  . ipratropium  0.5 mg Nebulization QID  . levalbuterol  0.63 mg Nebulization QID  . levETIRAcetam  125 mg Oral Q24H  . levETIRAcetam  250 mg Oral QAC breakfast  . levothyroxine  25 mcg Oral QAC breakfast  . midodrine  2.5 mg Oral BID WC  .  mometasone  1 application Topical Daily  . multivitamin-lutein  1 capsule Oral BID  . [START ON 06/23/2017] pantoprazole  40 mg Oral Once per day on Mon Wed Fri  . potassium chloride  10 mEq Oral Daily  . predniSONE  40 mg Oral Q breakfast  . sodium chloride flush  3 mL Intravenous Q12H   Continuous Infusions: . sodium chloride    . diltiazem (CARDIZEM) infusion 2.5 mg/hr (06/22/17 0910)   PRN Meds: sodium chloride, acetaminophen, MUSCLE RUB, nitroGLYCERIN, ondansetron **OR** ondansetron (ZOFRAN) IV, sodium chloride flush  Allergies:    Allergies  Allergen Reactions  . Ivp Dye [Iodinated Diagnostic Agents] Shortness Of Breath    ??  . Omnipaque [Iohexol]  Shortness Of Breath and Other (See Comments)    Short of breath with chest tightness after IV injection in CT, pt was fine when she left department but developed symptoms in parking lot and went to emergency department.  . Sulfa Antibiotics Shortness Of Breath  . Carbamazepine     REACTION: toxemia  . Dilaudid [Hydromorphone] Nausea And Vomiting  . Tramadol Other (See Comments)    confusion    Social History:   Social History   Socioeconomic History  . Marital status: Widowed    Spouse name: Not on file  . Number of children: Not on file  . Years of education: Not on file  . Highest education level: Not on file  Social Needs  . Financial resource strain: Not on file  . Food insecurity - worry: Not on file  . Food insecurity - inability: Not on file  . Transportation needs - medical: Not on file  . Transportation needs - non-medical: Not on file  Occupational History  . Not on file  Tobacco Use  . Smoking status: Former Smoker    Packs/day: 0.80    Years: 20.00    Pack years: 16.00    Types: Cigarettes    Last attempt to quit: 05/23/1990    Years since quitting: 27.1  . Smokeless tobacco: Never Used  Substance and Sexual Activity  . Alcohol use: No    Alcohol/week: 0.0 oz  . Drug use: No  . Sexual activity: No    Birth control/protection: Post-menopausal  Other Topics Concern  . Not on file  Social History Narrative   Widowed.     Family History:   The patient's family history includes Coronary artery disease in her mother; Heart attack in her maternal grandmother; Tuberculosis in her paternal grandfather.  ROS:  Please see the history of present illness.  Chronic back pain.  Memory loss and confusion.  All other ROS reviewed and negative.     Physical Exam/Data:   Vitals:   06/22/17 0800 06/22/17 0819 06/22/17 0900 06/22/17 1000  BP: 98/84   (!) 79/60  Pulse: (!) 116  (!) 127 (!) 109  Resp: (!) 22  18 (!) 21  Temp:  97.6 F (36.4 C)    TempSrc:  Oral      SpO2: 94%  92% 94%  Weight:      Height:        Intake/Output Summary (Last 24 hours) at 06/22/2017 1018 Last data filed at 06/22/2017 0949 Gross per 24 hour  Intake 81.21 ml  Output -  Net 81.21 ml   Filed Weights   06/22/17 0200 06/22/17 0212 06/22/17 0400  Weight: 131 lb 2.8 oz (59.5 kg) 131 lb 2.8 oz (59.5 kg) 131 lb 2.8 oz (59.5 kg)   Body  mass index is 21.83 kg/m.   Gen: Frail elderly woman, no distress.  HEENT: Conjunctiva and lids normal, oropharynx clear. Neck: Supple, no elevated JVP or carotid bruits, no thyromegaly. Lungs: Diminished breath sounds with scattered rhonchi, nonlabored breathing at rest. Cardiac: Irregularly irregular, no S3, soft systolic murmur, no pericardial rub. Abdomen: Soft, nontender, bowel sounds present. Extremities: No pitting edema, distal pulses 2+. Skin: Warm and dry. Musculoskeletal: Kyphosis. Neuropsychiatric: Alert and oriented x3, affect grossly appropriate.  EKG:  I personally reviewed the tracing from 06/22/2017 which showed atrial fibrillation with IVCD and repolarization of normalities.  Telemetry:  I personally reviewed telemetry which shows atrial fibrillation.  Relevant CV Studies:  Echocardiogram 04/23/2012: Study Conclusions  - Left ventricle: The cavity size was normal. There was mild concentric hypertrophy. Systolic function was normal. The estimated ejection fraction was in the range of 60% to 65%. Wall motion was normal; there were no regional wall motion abnormalities. - Aortic valve: Mildly calcified annulus. Mildly calcified leaflets. - Mitral valve: Calcified annulus. - Left atrium: The atrium was moderately dilated. - Right atrium: The atrium was mildly to moderately dilated. - Atrial septum: No defect or patent foramen ovale was identified.  Laboratory Data:  Chemistry Recent Labs  Lab 06/21/17 2344 06/21/17 2349  NA 140 139  K 4.8 4.0  CL 97* 101  CO2 28  --   GLUCOSE 132* 132*   BUN 25* 24*  CREATININE 0.81 0.80  CALCIUM 11.3*  --   GFRNONAA >60  --   GFRAA >60  --   ANIONGAP 15  --     No results for input(s): PROT, ALBUMIN, AST, ALT, ALKPHOS, BILITOT in the last 168 hours. Hematology Recent Labs  Lab 06/21/17 2113 06/21/17 2349  WBC 13.6*  --   RBC 5.68*  --   HGB 16.3* 17.3*  HCT 50.6* 51.0*  MCV 89.1  --   MCH 28.7  --   MCHC 32.2  --   RDW 14.9  --   PLT 197  --    Cardiac Enzymes Recent Labs  Lab 06/21/17 2344  TROPONINI <0.03    Recent Labs  Lab 06/21/17 2347  TROPIPOC 0.01    BNP Recent Labs  Lab 06/21/17 2113  BNP 47.0    Radiology/Studies:  Dg Chest 2 View  Result Date: 06/21/2017 CLINICAL DATA:  Cough, shortness of Breath EXAM: CHEST  2 VIEW COMPARISON:  12/04/2016 FINDINGS: Cardiomegaly. Platelike atelectasis in the right mid lung. Left lung clear. No effusions or edema. No acute bony abnormality. IMPRESSION: Right midlung atelectasis.  Cardiomegaly. Electronically Signed   By: Charlett Nose M.D.   On: 06/21/2017 22:34   Dg Thoracic Spine W/swimmers  Result Date: 06/21/2017 CLINICAL DATA:  Upper back pain EXAM: THORACIC SPINE - 3 VIEWS COMPARISON:  Chest x-ray today and 12/04/2016. Two-view chest x-ray 09/10/2016 FINDINGS: Multiple moderate compression fractures in the midthoracic spine, likely T6-T8. The T7 compression fracture has progressed since prior study. The other compression fractures are stable. Stable mild compression fracture at T10 and superior endplate of T11, stable. No acute fracture. IMPRESSION: Chronic compression fractures in the mid and lower thoracic spine, with slight worsening at T7. Otherwise no change. Electronically Signed   By: Charlett Nose M.D.   On: 06/21/2017 22:36    Assessment and Plan:   1.  Chronic atrial fibrillation with recent RVR, likely multifactorial in the setting of back pain and nebulizer treatments.  Outpatient regimen includes digoxin and divided dose short acting Cardizem  which she  has generally tolerated well.  She is not anticoagulated with history of falls and previous retroperitoneal hemorrhage.  2.  Chronic hypotension, on midodrine as an outpatient.  3.  Recent pneumonia.  4.  Dementia.  5.  History of CAD with history of BMS to the circumflex in 2009.  No active angina.  Troponin I levels negative.  Would recommend stopping intravenous diltiazem and convert back to her standard regimen which she has generally tolerated well.  Will place her on digoxin 0.125 mg daily and Cardizem 30 mg p.o. every 6 hours for now.  Continue midodrine as well.   Signed, Nona Dell, MD  06/22/2017 10:18 AM

## 2017-06-22 NOTE — Clinical Social Work Note (Signed)
Clinical Social Work Assessment  Patient Details  Name: Deborah [name] MRN: [id number] Date of Birth: 1925/11/27  Date of referral:  06/22/17               Reason for consult:  Discharge Planning                Permission sought to share information with:    Permission granted to share information::     Name::        Agency::     Relationship::     Contact Information:     Housing/Transportation Living arrangements for the past 2 months:  Assisted Living Facility Source of Information:  Facility Patient Interpreter Needed:  None Criminal Activity/Legal Involvement Pertinent to Current Situation/Hospitalization:  No - Comment as needed Significant Relationships:  Adult Children Lives with:  Facility Resident Do you feel safe going back to the place where you live?  Yes Need for family participation in patient care:  Yes (Comment)  Care giving concerns: Pt lives at [facility].    Social Worker assessment / plan: Pt is an 84 year old admitted from [facility]. Pt has some mild dementia. She also has macular degeneration. Spoke with VeronaMelinda at the ALF. Per Juliette AlcideMelinda, pt is quite independent in ADLs at the ALF. She ambulates with a cane. She has not been using O2 but Juliette AlcideMelinda wondering if she might qualify for it as pt having shortness of breath frequently and she also recently had pneumonia per Rose HillMelinda. Pt has good support from her daughter.  MD stated he would order PT consult when pt appropriate to determine if pt will have any rehab needs upon dc. Juliette AlcideMelinda states pt can return over the weekend if needed. Juliette AlcideMelinda can be called on her cell as needed [phone].  Employment status:  Retired Health and safety inspectornsurance information:  Medicare PT Recommendations:  Not assessed at this time Information / Referral to community resources:     Patient/Family's Response to care: Pt and family accepting of care.  Patient/Family's Understanding of and Emotional Response to Diagnosis, Current Treatment, and  Prognosis: Pt and family appear to understand diagnosis and treatment recommendations. No emotional distress noted.  Emotional Assessment Appearance:  Appears stated age Attitude/Demeanor/Rapport:  Gracious Affect (typically observed):  Accepting, Appropriate, Calm Orientation:  Oriented to Self, Oriented to Place Alcohol / Substance use:  Not Applicable Psych involvement (Current and /or in the community):  No (Comment)  Discharge Needs  Concerns to be addressed:  Discharge Planning Concerns Readmission within the last 30 days:  No Current discharge risk:  None Barriers to Discharge:  No Barriers Identified   Elliot GaultKathleen Colden Samaras, LCSW 06/22/2017, 12:02 PM

## 2017-06-22 NOTE — Progress Notes (Signed)
HR noted to have increased into 120-130's. Cardizem restarted at 5 and will titrate down due to low BP

## 2017-06-22 NOTE — ED Notes (Signed)
In to start pt cardizem dose, bp's have been stable, just after starting the bolus dose, the patient blood pressure was noted to be 80/90's sbp. Cardizem bolus stopped, notified dr. Clarene Dukemcmanus of the same order for bolus dose. Per patient daughter, the patient's blood pressure is usually in the 90's at a baseline.

## 2017-06-22 NOTE — Progress Notes (Signed)
Patient blood pressure noted to be 63/48 @ 0445 and 72/57 at 0500. Patient asymptomatic and easily arousable. No complaints of pain, discomfort or distress. This RN stopped the cardizem drip and notified Dr. Sherryll BurgerShah via text/pager. No new orders received. Cardiology consult placed.

## 2017-06-22 NOTE — Progress Notes (Signed)
06/21/2017  8:40 PM  06/22/2017 8:24 AM  Gertie FeySara C Wollam was seen and examined.  The H&P by the admitting provider, orders, imaging was reviewed.  Please see new orders.  Pt does have soft BPs.  HR is in the 120 -130 range.  Cardizem drip stopped due to low BPs.  Pt due to receive morning midodrine for chronic low BP.  Cardiology consulted to see for Afib.  She was unable to receive oral diltiazem due to BP.  Also has digoxin ordered.  Pt is totally asymptomatic this morning during my exam.  Will continue to follow.   Maryln Manuel. Zakariyya Helfman, MD [facility]

## 2017-06-22 NOTE — H&P (Addendum)
History and Physical    Deborah [name] DOB: [birth year] DOA: 06/21/2017  PCP: Ignatius Specking, MD   Patient coming from: ALF  Chief Complaint: Thoracic back pain  HPI: Deborah [name] is a 84 y.o. female with medical history significant for atrial fibrillation off anticoagulation due to recurrent falls, chronic diastolic heart failure, dementia, COPD, seizures, hypothyroidism, and chronic hypotension who is noted to have persistent dyspnea as well as cough and wheezing over the last 2 weeks and was noted to have pneumonia 1 week ago which was treated with a 5-day course of oral antibiotics.  She was placed on a repeat 5-day course of oral prednisone just yesterday by the physician at the assisted living facility due to ongoing symptoms.  No fever or chills have been noted and there is no productive cough.  The daughter at the bedside is the primary historian and she states that her persistent cough has caused her to develop severe muscle spasms of her upper back that tends to be recurrent.  She was actually brought to the emergency department for further evaluation of her upper back pain as well as her persistent pulmonary symptoms.  During the course of treatment in the ED, patient developed atrial fibrillation with RVR and was placed on a Cardizem IV bolus followed by drip and experience some mild hypotension which was asymptomatic.  It appears that the patient has a baseline blood pressure of approximately 90 systolic and 60 diastolic.   ED Course: Patient was initially started on breathing treatments as well as IV Solu-Medrol and subsequently developed atrial fibrillation with RVR for which she received a Cardizem bolus which led to some hypotension for which a 250 normal saline bolus was also given.  She is now currently on a Cardizem drip and has also received some morphine which has helped her upper thoracic pain.  Chest x-ray with no acute findings and thoracic spine x-ray with multiple  chronic thoracic fractures noted.  Review of Systems: Cannot be adequately obtained as patient is a poor historian on account of her dementia.  Past Medical History:  Diagnosis Date  . Alzheimer disease   . Asthma   . Atrial fibrillation (HCC)    Permanent  . Chronic anticoagulation    Xarelto discontinued after fall  . Chronic diastolic heart failure (HCC)   . COPD (chronic obstructive pulmonary disease) (HCC)   . Coronary atherosclerosis of native coronary artery    BMS circumflex 2009  . Dementia without behavioral disturbance   . Drug intolerance    Flecacinide and Amiodarone   . Fracture of multiple pubic rami (HCC) 12/13   Left superior and inferior - following fall  . Hypertension   . Hypotension   . Mixed hyperlipidemia   . Osteoarthritis   . Retroperitoneal hemorrhage 12/13   Following fall  . Seizures (HCC)    Remote history of over 20 years ago  . Umbilical hernia     Past Surgical History:  Procedure Laterality Date  . CATARACT EXTRACTION    . Radiofrequency catheter ablation     Failed  . Right carpel tunnel release    . TONSILLECTOMY       reports that she quit smoking about 27 years ago. Her smoking use included cigarettes. She has a 16.00 pack-year smoking history. she has never used smokeless tobacco. She reports that she does not drink alcohol or use drugs.  Allergies  Allergen Reactions  . Ivp Dye [Iodinated Diagnostic Agents] Shortness Of  Breath    ??  . Omnipaque [Iohexol] Shortness Of Breath and Other (See Comments)    Short of breath with chest tightness after IV injection in CT, pt was fine when she left department but developed symptoms in parking lot and went to emergency department.  . Sulfa Antibiotics Shortness Of Breath  . Carbamazepine     REACTION: toxemia  . Dilaudid [Hydromorphone] Nausea And Vomiting  . Tramadol Other (See Comments)    confusion    Family History  Problem Relation Age of Onset  . Coronary artery disease  Mother   . Tuberculosis Paternal Grandfather   . Heart attack Maternal Grandmother     Prior to Admission medications   Medication Sig Start Date End Date Taking? Authorizing Provider  acetaminophen (TYLENOL) 500 MG tablet Take 500 mg by mouth every 6 (six) hours as needed for mild pain. For pain    Yes [provider]  albuterol (PROVENTIL HFA;VENTOLIN HFA) 108 (90 Base) MCG/ACT inhaler Inhale 2 puffs into the lungs every 6 (six) hours as needed for wheezing or shortness of breath.   Yes [provider]  albuterol (PROVENTIL) (2.5 MG/3ML) 0.083% nebulizer solution Take 2.5 mg by nebulization every 6 (six) hours as needed for wheezing or shortness of breath.    Yes [provider]  alendronate (FOSAMAX) 70 MG tablet Take 70 mg by mouth every Wednesday. Take with a full glass of water on an empty stomach.    Yes [provider]  aspirin (ASPIRIN EC) 81 MG EC tablet Take 1 tablet (81 mg total) by mouth daily. Swallow whole. 05/04/12  Yes Elliot CousinFisher, Denise, MD  atorvastatin (LIPITOR) 40 MG tablet Take 40 mg by mouth. Monday, Wednesday, & Friday evening.   Yes [provider]  calcium citrate-vitamin D 500-400 MG-UNIT chewable tablet Chew 1 tablet by mouth 2 (two) times daily.   Yes [provider]  digoxin (LANOXIN) 0.125 MG tablet Take 1 tablet (0.125 mg total) by mouth every evening. 09/21/16  Yes Philip AspenHernandez Acosta, Limmie PatriciaEstela Y, MD  diltiazem (CARDIZEM) 30 MG tablet Take 1 tablet (30 mg total) by mouth every 6 (six) hours. 09/21/16  Yes Philip AspenHernandez Acosta, Limmie PatriciaEstela Y, MD  docusate sodium (COLACE) 100 MG capsule Take 100 mg by mouth at bedtime.   Yes [provider]  fluticasone (FLONASE) 50 MCG/ACT nasal spray Place 2 sprays into both nostrils daily.   Yes [provider]  furosemide (LASIX) 20 MG tablet Take 10 mg by mouth 2 (two) times daily.    Yes [provider]  guaiFENesin (MUCINEX) 600 MG 12 hr tablet Take 2 tablets (1,200 mg  total) by mouth 2 (two) times daily. 09/21/16  Yes Philip AspenHernandez Acosta, Limmie PatriciaEstela Y, MD  levETIRAcetam (KEPPRA) 250 MG tablet Take 125-250 mg by mouth See admin instructions. Takes one tablet in the morning and half a tablet at 3 pm.   Yes [provider]  levothyroxine (SYNTHROID, LEVOTHROID) 25 MCG tablet Take 25 mcg by mouth daily before breakfast.   Yes [provider]  Menthol (ICY HOT) 5 % PTCH Apply 1 application topically daily. Apply to top of back   Yes [provider]  midodrine (PROAMATINE) 2.5 MG tablet Take 1 tablet (2.5 mg total) by mouth 2 (two) times daily with a meal. The dosing can be titrated down to once daily when her systolic blood pressure is consistently in the 100s. 05/04/12  Yes Elliot CousinFisher, Denise, MD  Mineral Oil-Oleth-Polysorbate (MIN-O-EAR) LIQD Place 2 drops in  ear(s) every Monday. At bedtime for stopped up ears   Yes [provider]  mometasone (ELOCON) 0.1 % cream Apply 1 application topically daily. To ears for eczema   Yes [provider]  Multiple Vitamins-Minerals (ICAPS) CAPS Take 1 capsule by mouth 2 (two) times daily.   Yes [provider]  omeprazole (PRILOSEC OTC) 20 MG tablet Take 20 mg by mouth. Monday, Wednesday, & Friday evening.   Yes [provider]  potassium chloride (KLOR-CON) 10 MEQ CR tablet Take 10 mEq by mouth daily.     Yes [provider]  Pramox-PE-Glycerin-Petrolatum (PREPARATION H) 1-0.25-14.4-15 % CREA Use daily prn Patient taking differently: Place 1 application rectally daily as needed (for hemorrhoids). Use daily prn 03/24/17  Yes Adline Potter, NP  Probiotic Product (PROBIOTIC DAILY PO) Take 1 tablet by mouth every morning.   Yes [provider]  moxifloxacin (AVELOX) 400 MG tablet Take 400 mg by mouth daily. 7 day course completed on 06/19/2017 06/12/17   [provider]  nitroGLYCERIN (NITROSTAT) 0.4 MG SL tablet Place 0.4 mg under the tongue every 5  (five) minutes as needed for chest pain.    [provider]    Physical Exam: Vitals:   06/21/17 2330 06/21/17 2351 06/21/17 2354 06/22/17 0015  BP: 128/79 (!) 81/70 (!) 91/59 (!) 89/75  Pulse:   (!) 136 (!) 123  Resp: (!) 24 (!) 29 (!) 28 (!) 26  Temp:      TempSrc:      SpO2:   94% 92%  Weight:      Height:        Constitutional: NAD, calm, comfortable Vitals:   06/21/17 2330 06/21/17 2351 06/21/17 2354 06/22/17 0015  BP: 128/79 (!) 81/70 (!) 91/59 (!) 89/75  Pulse:   (!) 136 (!) 123  Resp: (!) 24 (!) 29 (!) 28 (!) 26  Temp:      TempSrc:      SpO2:   94% 92%  Weight:      Height:       Eyes: lids and conjunctivae normal ENMT: Mucous membranes are moist.  Neck: normal, supple Respiratory: diminished to auscultation bilaterally. Normal respiratory effort. No accessory muscle use.  Cardiovascular: Afib with rvr, no murmurs Abdomen: no tenderness, no distention. Bowel sounds positive.  Musculoskeletal:  No joint deformity upper and lower extremities.   Skin: no rashes, lesions, ulcers.  Psychiatric: Difficult to assess due to dementia  Labs on Admission: I have personally reviewed following labs and imaging studies  CBC: Recent Labs  Lab 06/21/17 2113 06/21/17 2349  WBC 13.6*  --   NEUTROABS 11.0*  --   HGB 16.3* 17.3*  HCT 50.6* 51.0*  MCV 89.1  --   PLT 197  --    Basic Metabolic Panel: Recent Labs  Lab 06/21/17 2349  NA 139  K 4.0  CL 101  GLUCOSE 132*  BUN 24*  CREATININE 0.80   GFR: Estimated Creatinine Clearance: 42.9 mL/min (by C-G formula based on SCr of 0.8 mg/dL). Liver Function Tests: No results for input(s): AST, ALT, ALKPHOS, BILITOT, PROT, ALBUMIN in the last 168 hours. No results for input(s): LIPASE, AMYLASE in the last 168 hours. No results for input(s): AMMONIA in the last 168 hours. Coagulation Profile: No results for input(s): INR, PROTIME in the last 168 hours. Cardiac Enzymes: No results for input(s): CKTOTAL, CKMB,  CKMBINDEX, TROPONINI in the last 168 hours. BNP (last 3 results) No results for input(s): PROBNP in  the last 8760 hours. HbA1C: No results for input(s): HGBA1C in the last 72 hours. CBG: No results for input(s): GLUCAP in the last 168 hours. Lipid Profile: No results for input(s): CHOL, HDL, LDLCALC, TRIG, CHOLHDL, LDLDIRECT in the last 72 hours. Thyroid Function Tests: No results for input(s): TSH, T4TOTAL, FREET4, T3FREE, THYROIDAB in the last 72 hours. Anemia Panel: No results for input(s): VITAMINB12, FOLATE, FERRITIN, TIBC, IRON, RETICCTPCT in the last 72 hours. Urine analysis:    Component Value Date/Time   COLORURINE YELLOW 06/21/2017 2111   APPEARANCEUR HAZY (A) 06/21/2017 2111   LABSPEC 1.027 06/21/2017 2111   PHURINE 5.0 06/21/2017 2111   GLUCOSEU NEGATIVE 06/21/2017 2111   HGBUR NEGATIVE 06/21/2017 2111   BILIRUBINUR NEGATIVE 06/21/2017 2111   KETONESUR NEGATIVE 06/21/2017 2111   PROTEINUR NEGATIVE 06/21/2017 2111   UROBILINOGEN 0.2 04/28/2012 1830   NITRITE NEGATIVE 06/21/2017 2111   LEUKOCYTESUR LARGE (A) 06/21/2017 2111    Radiological Exams on Admission: Dg Chest 2 View  Result Date: 06/21/2017 CLINICAL DATA:  Cough, shortness of Breath EXAM: CHEST  2 VIEW COMPARISON:  12/04/2016 FINDINGS: Cardiomegaly. Platelike atelectasis in the right mid lung. Left lung clear. No effusions or edema. No acute bony abnormality. IMPRESSION: Right midlung atelectasis.  Cardiomegaly. Electronically Signed   By: Charlett Nose M.D.   On: 06/21/2017 22:34   Dg Thoracic Spine W/swimmers  Result Date: 06/21/2017 CLINICAL DATA:  Upper back pain EXAM: THORACIC SPINE - 3 VIEWS COMPARISON:  Chest x-ray today and 12/04/2016. Two-view chest x-ray 09/10/2016 FINDINGS: Multiple moderate compression fractures in the midthoracic spine, likely T6-T8. The T7 compression fracture has progressed since prior study. The other compression fractures are stable. Stable mild compression fracture at T10 and  superior endplate of T11, stable. No acute fracture. IMPRESSION: Chronic compression fractures in the mid and lower thoracic spine, with slight worsening at T7. Otherwise no change. Electronically Signed   By: Charlett Nose M.D.   On: 06/21/2017 22:36    EKG: Independently reviewed. Afib.  Assessment/Plan Principal Problem:   COPD exacerbation (HCC) Active Problems:   Mixed hyperlipidemia   DIASTOLIC HEART FAILURE, CHRONIC   Hypotension   Seizures (HCC)   Atrial fibrillation with RVR (HCC)   Compression fracture of body of thoracic vertebra (HCC)    1. Acute COPD exacerbation-persistent secondary to recent pneumonia/bronchitis.  Continue treatment with oral prednisone as well as ipratropium nebulizer treatments with possibly some Xopenex to mitigate side effects which may exacerbate her arrhythmia.  Recently completed outpatient antibiotic treatment, but due to persistence of symptoms will place on azithromycin. 2. Atrial fibrillation with RVR.  Likely exacerbated due to breathing treatment in ED.  Will maintain on Cardizem drip and titrate with monitoring in stepdown unit.  Continue oral Cardizem and Digoxin. Check Dig level. No further evaluation appears necessary in this case. I will, however check Mg and TSH as she is hypothyroid. 3. Hypotension.  Appears to be at baseline per daughter who is very familiar with her blood pressure readings.  Continue midodrine. Will monitor closely on account of current arrhythmia on Cardizem drip. 4. Thoracic muscle spasms.  Patient is reluctant to remain on any narcotic medications as she is very sensitive.  She is agreeable to Tylenol which I will continue along with muscle rubs and hot compresses. 5. History of seizures.  Continue Keppra. 6. Dementia.  Neurochecks.   DVT prophylaxis:Lovenox Code Status: DNR Family Communication: Daughter, POA at bedside Disposition Plan:ALF when stable Consults called:None Admission status: Inpatient;  SDU  Pratik Hoover Brunette DO [facility] Pager [phone]  If 7PM-7AM, please contact night-coverage [email or web address] Password [email or web address]  06/22/2017, 12:30 AM

## 2017-06-22 NOTE — ED Notes (Signed)
Dr. Clarene Dukemcmanus to the patient bedside. Aware of patient bp 89/75, orders to restart pt cardizem gtt now at 2.5mg /hr. Now, admitting provider at the bedside for assessment.

## 2017-06-23 DIAGNOSIS — S22000A Wedge compression fracture of unspecified thoracic vertebra, initial encounter for closed fracture: Secondary | ICD-10-CM

## 2017-06-23 DIAGNOSIS — E782 Mixed hyperlipidemia: Secondary | ICD-10-CM

## 2017-06-23 DIAGNOSIS — R569 Unspecified convulsions: Secondary | ICD-10-CM

## 2017-06-23 DIAGNOSIS — I5032 Chronic diastolic (congestive) heart failure: Secondary | ICD-10-CM

## 2017-06-23 MED ORDER — IPRATROPIUM BROMIDE 0.02 % IN SOLN: 0.5000 mg | Freq: Three times a day (TID) | RESPIRATORY_TRACT | Status: DC

## 2017-06-23 MED ORDER — MUSCLE RUB 10-15 % EX CREA: 1.0000 "application " | TOPICAL_CREAM | CUTANEOUS | 0 refills | Status: DC | PRN

## 2017-06-23 MED ORDER — LEVALBUTEROL HCL 0.63 MG/3ML IN NEBU: 0.6300 mg | INHALATION_SOLUTION | Freq: Three times a day (TID) | RESPIRATORY_TRACT | Status: DC

## 2017-06-23 MED ORDER — AZITHROMYCIN 250 MG PO TABS: 250.0000 mg | ORAL_TABLET | Freq: Every day | ORAL | 0 refills | Status: AC

## 2017-06-23 MED ORDER — PREDNISONE 20 MG PO TABS: 40.0000 mg | ORAL_TABLET | Freq: Every day | ORAL | 0 refills | Status: AC

## 2017-06-23 NOTE — Clinical Social Work Note (Signed)
LCSW following. Per MD, pt stable for dc today. Spoke with Marcelino DusterMichelle at [facility] to update. Per Marcelino DusterMichelle, she and the RN will be here this AM to evaluate pt before her return. Updated pt's daughter, Darl PikesSusan, by phone. Per Darl PikesSusan, she would like the ALF to transport pt at dc.   Trilby LeaverLCSW, Heather Settle, [phone]) will further assist with pt's dc once orders and dc summary complete.

## 2017-06-23 NOTE — Clinical Social Work Note (Signed)
D/c clinicals sent to facility. Facility is here to transport patient.   LCSW signing off.     Lourine Alberico, Juleen ChinaHeather D, LCSW

## 2017-06-23 NOTE — Care Management Note (Signed)
Case Management Note  Patient Details  Name: Deborah [name] MRN: [id number] Date of Birth: 21-Nov-1925    Expected Discharge Date:  06/22/17               Expected Discharge Plan:  Assisted Living / Rest Home  In-House Referral:     Discharge planning Services  CM Consult  Post Acute Care Choice:  Home Health Choice offered to:  Patient  DME Arranged:    DME Agency:     HH Arranged:  PT HH Agency:     Status of Service:  Completed, signed off  If discussed at Long Length of Stay Meetings, dates discussed:    Additional Comments: Patient discharging back to [facility]. [facility]. Patient does not have a preference, wants to defer [facility]. Will arrange PT. No other CM needs.  Starlina Lapre, Chrystine OilerSharley Diane, RN 06/23/2017, 11:55 AM

## 2017-06-23 NOTE — Discharge Summary (Signed)
Physician Discharge Summary  Deborah [name]:[id number] DOB: 1925/11/09 DOA: 06/21/2017  PCP: Ignatius Specking, MD  Admit date: 06/21/2017 Discharge date: 06/23/2017  Admitted From: ALF Disposition: [facility]  Recommendations for Outpatient Follow-up:  1. Follow up with PCP in 2 weeks 2. Please obtain BMP/CBC in one week 3. Please continue azithromycin and prednisone thru 06/27/17 then stop 4. Please follow up on the following pending results: final culture data  Home Health: Physical Therapy  Discharge Condition: STABLE   CODE STATUS: DNR   Brief Hospitalization Summary: Please see all hospital notes, images, labs for full details of the hospitalization. HPI: Deborah [name] is a 84 y.o. female with medical history significant for atrial fibrillation off anticoagulation due to recurrent falls, chronic diastolic heart failure, dementia, COPD, seizures, hypothyroidism, and chronic hypotension who is noted to have persistent dyspnea as well as cough and wheezing over the last 2 weeks and was noted to have pneumonia 1 week ago which was treated with a 5-day course of oral antibiotics.  She was placed on a repeat 5-day course of oral prednisone just yesterday by the physician at the assisted living facility due to ongoing symptoms.  No fever or chills have been noted and there is no productive cough.  The daughter at the bedside is the primary historian and she states that her persistent cough has caused her to develop severe muscle spasms of her upper back that tends to be recurrent.  She was actually brought to the emergency department for further evaluation of her upper back pain as well as her persistent pulmonary symptoms.  During the course of treatment in the ED, patient developed atrial fibrillation with RVR and was placed on a Cardizem IV bolus followed by drip and experience some mild hypotension which was asymptomatic.  It appears that the patient has a baseline blood pressure of  approximately 90 systolic and 60 diastolic.   ED Course: Patient was initially started on breathing treatments as well as IV Solu-Medrol and subsequently developed atrial fibrillation with RVR for which she received a Cardizem bolus which led to some hypotension for which a 250 normal saline bolus was also given.  She is now currently on a Cardizem drip and has also received some morphine which has helped her upper thoracic pain.  Chest x-ray with no acute findings and thoracic spine x-ray with multiple chronic thoracic fractures noted.  1. Acute COPD exacerbation-persistent secondary to recent pneumonia/bronchitis.  Continue treatment with oral prednisone as well as ipratropium nebulizer treatments. Complete 3 more days of azithromycin.. 2. Atrial fibrillation with RVR.  Resolved now.  Seen by cardiology and restarted on home treatment regimen with good results.  Likely exacerbated due to breathing treatment in ED.   3. Chronic Hypotension.  Appears to be at baseline per daughter who is very familiar with her blood pressure readings.  Continue midodrine.  4. Thoracic muscle spasms.  Patient is reluctant to remain on any narcotic medications as she is very sensitive.  She is agreeable to Tylenol which I will continue along with muscle rubs and hot compresses. 5. History of seizures.  Continue Keppra. 6. Dementia. STABLE. Returning to ALF at discharge.   Discharge Diagnoses:  Principal Problem:   COPD exacerbation (HCC) Active Problems:   Mixed hyperlipidemia   DIASTOLIC HEART FAILURE, CHRONIC   Hypotension   Seizures (HCC)   Atrial fibrillation with RVR (HCC)   Compression fracture of body of thoracic vertebra (HCC)   Atrial fibrillation with rapid  ventricular response [facility])  Discharge Instructions: Discharge Instructions    Increase activity slowly   Complete by:  As directed      Allergies as of 06/23/2017      Reactions   Ivp Dye [iodinated Diagnostic Agents] Shortness Of Breath    ??   Omnipaque [iohexol] Shortness Of Breath, Other (See Comments)   Short of breath with chest tightness after IV injection in CT, pt was fine when she left department but developed symptoms in parking lot and went to emergency department.   Sulfa Antibiotics Shortness Of Breath   Carbamazepine    REACTION: toxemia   Dilaudid [hydromorphone] Nausea And Vomiting   Tramadol Other (See Comments)   confusion      Medication List    STOP taking these medications   moxifloxacin 400 MG tablet Commonly known as:  AVELOX     TAKE these medications   acetaminophen 500 MG tablet Commonly known as:  TYLENOL Take 500 mg by mouth every 6 (six) hours as needed for mild pain. For pain   albuterol 108 (90 Base) MCG/ACT inhaler Commonly known as:  PROVENTIL HFA;VENTOLIN HFA Inhale 2 puffs into the lungs every 6 (six) hours as needed for wheezing or shortness of breath.   albuterol (2.5 MG/3ML) 0.083% nebulizer solution Commonly known as:  PROVENTIL Take 2.5 mg by nebulization every 6 (six) hours as needed for wheezing or shortness of breath.   alendronate 70 MG tablet Commonly known as:  FOSAMAX Take 70 mg by mouth every Wednesday. Take with a full glass of water on an empty stomach.   aspirin 81 MG EC tablet Commonly known as:  aspirin EC Take 1 tablet (81 mg total) by mouth daily. Swallow whole.   atorvastatin 40 MG tablet Commonly known as:  LIPITOR Take 40 mg by mouth. Monday, Wednesday, & Friday evening.   azithromycin 250 MG tablet Commonly known as:  ZITHROMAX Take 1 tablet (250 mg total) by mouth daily for 4 days.   calcium citrate-vitamin D 500-400 MG-UNIT chewable tablet Chew 1 tablet by mouth 2 (two) times daily.   digoxin 0.125 MG tablet Commonly known as:  LANOXIN Take 1 tablet (0.125 mg total) by mouth every evening.   diltiazem 30 MG tablet Commonly known as:  CARDIZEM Take 1 tablet (30 mg total) by mouth every 6 (six) hours.   docusate sodium 100 MG  capsule Commonly known as:  COLACE Take 100 mg by mouth at bedtime.   fluticasone 50 MCG/ACT nasal spray Commonly known as:  FLONASE Place 2 sprays into both nostrils daily.   furosemide 20 MG tablet Commonly known as:  LASIX Take 10 mg by mouth 2 (two) times daily.   guaiFENesin 600 MG 12 hr tablet Commonly known as:  MUCINEX Take 2 tablets (1,200 mg total) by mouth 2 (two) times daily.   ICAPS Caps Take 1 capsule by mouth 2 (two) times daily.   ICY HOT 5 % Ptch Generic drug:  Menthol Apply 1 application topically daily. Apply to top of back   levETIRAcetam 250 MG tablet Commonly known as:  KEPPRA Take 125-250 mg by mouth See admin instructions. Takes one tablet in the morning and half a tablet at 3 pm.   levothyroxine 25 MCG tablet Commonly known as:  SYNTHROID, LEVOTHROID Take 25 mcg by mouth daily before breakfast.   midodrine 2.5 MG tablet Commonly known as:  PROAMATINE Take 1 tablet (2.5 mg total) by mouth 2 (two) times daily with a meal. The  dosing can be titrated down to once daily when her systolic blood pressure is consistently in the 100s.   MIN-O-EAR Liqd Place 2 drops in ear(s) every Monday. At bedtime for stopped up ears   mometasone 0.1 % cream Commonly known as:  ELOCON Apply 1 application topically daily. To ears for eczema   nitroGLYCERIN 0.4 MG SL tablet Commonly known as:  NITROSTAT Place 0.4 mg under the tongue every 5 (five) minutes as needed for chest pain.   omeprazole 20 MG tablet Commonly known as:  PRILOSEC OTC Take 20 mg by mouth. Monday, Wednesday, & Friday evening.   potassium chloride 10 MEQ CR tablet Commonly known as:  KLOR-CON Take 10 mEq by mouth daily.   Pramox-PE-Glycerin-Petrolatum 1-0.25-14.4-15 % Crea Commonly known as:  PREPARATION H Use daily prn What changed:    how much to take  how to take this  when to take this  reasons to take this  additional instructions   predniSONE 20 MG tablet Commonly known as:   DELTASONE Take 2 tablets (40 mg total) by mouth daily with breakfast for 3 days. Start taking on:  06/24/2017   PROBIOTIC DAILY PO Take 1 tablet by mouth every morning.      Follow-up Information    [name], [name], MD. Schedule an appointment as soon as possible for a visit in 2 week(s).   Specialty:  Internal Medicine Contact information: [address] [place] [address] [phone]          Allergies  Allergen Reactions  . Ivp Dye [Iodinated Diagnostic Agents] Shortness Of Breath    ??  . Omnipaque [Iohexol] Shortness Of Breath and Other (See Comments)    Short of breath with chest tightness after IV injection in CT, pt was fine when she left department but developed symptoms in parking lot and went to emergency department.  . Sulfa Antibiotics Shortness Of Breath  . Carbamazepine     REACTION: toxemia  . Dilaudid [Hydromorphone] Nausea And Vomiting  . Tramadol Other (See Comments)    confusion   Allergies as of 06/23/2017      Reactions   Ivp Dye [iodinated Diagnostic Agents] Shortness Of Breath   ??   Omnipaque [iohexol] Shortness Of Breath, Other (See Comments)   Short of breath with chest tightness after IV injection in CT, pt was fine when she left department but developed symptoms in parking lot and went to emergency department.   Sulfa Antibiotics Shortness Of Breath   Carbamazepine    REACTION: toxemia   Dilaudid [hydromorphone] Nausea And Vomiting   Tramadol Other (See Comments)   confusion      Medication List    STOP taking these medications   moxifloxacin 400 MG tablet Commonly known as:  AVELOX     TAKE these medications   acetaminophen 500 MG tablet Commonly known as:  TYLENOL Take 500 mg by mouth every 6 (six) hours as needed for mild pain. For pain   albuterol 108 (90 Base) MCG/ACT inhaler Commonly known as:  PROVENTIL HFA;VENTOLIN HFA Inhale 2 puffs into the lungs every 6 (six) hours as needed for wheezing or shortness of breath.    albuterol (2.5 MG/3ML) 0.083% nebulizer solution Commonly known as:  PROVENTIL Take 2.5 mg by nebulization every 6 (six) hours as needed for wheezing or shortness of breath.   alendronate 70 MG tablet Commonly known as:  FOSAMAX Take 70 mg by mouth every Wednesday. Take with a full glass of water on  an empty stomach.   aspirin 81 MG EC tablet Commonly known as:  aspirin EC Take 1 tablet (81 mg total) by mouth daily. Swallow whole.   atorvastatin 40 MG tablet Commonly known as:  LIPITOR Take 40 mg by mouth. Monday, Wednesday, & Friday evening.   azithromycin 250 MG tablet Commonly known as:  ZITHROMAX Take 1 tablet (250 mg total) by mouth daily for 4 days.   calcium citrate-vitamin D 500-400 MG-UNIT chewable tablet Chew 1 tablet by mouth 2 (two) times daily.   digoxin 0.125 MG tablet Commonly known as:  LANOXIN Take 1 tablet (0.125 mg total) by mouth every evening.   diltiazem 30 MG tablet Commonly known as:  CARDIZEM Take 1 tablet (30 mg total) by mouth every 6 (six) hours.   docusate sodium 100 MG capsule Commonly known as:  COLACE Take 100 mg by mouth at bedtime.   fluticasone 50 MCG/ACT nasal spray Commonly known as:  FLONASE Place 2 sprays into both nostrils daily.   furosemide 20 MG tablet Commonly known as:  LASIX Take 10 mg by mouth 2 (two) times daily.   guaiFENesin 600 MG 12 hr tablet Commonly known as:  MUCINEX Take 2 tablets (1,200 mg total) by mouth 2 (two) times daily.   ICAPS Caps Take 1 capsule by mouth 2 (two) times daily.   ICY HOT 5 % Ptch Generic drug:  Menthol Apply 1 application topically daily. Apply to top of back   levETIRAcetam 250 MG tablet Commonly known as:  KEPPRA Take 125-250 mg by mouth See admin instructions. Takes one tablet in the morning and half a tablet at 3 pm.   levothyroxine 25 MCG tablet Commonly known as:  SYNTHROID, LEVOTHROID Take 25 mcg by mouth daily before breakfast.   midodrine 2.5 MG tablet Commonly  known as:  PROAMATINE Take 1 tablet (2.5 mg total) by mouth 2 (two) times daily with a meal. The dosing can be titrated down to once daily when her systolic blood pressure is consistently in the 100s.   MIN-O-EAR Liqd Place 2 drops in ear(s) every Monday. At bedtime for stopped up ears   mometasone 0.1 % cream Commonly known as:  ELOCON Apply 1 application topically daily. To ears for eczema   nitroGLYCERIN 0.4 MG SL tablet Commonly known as:  NITROSTAT Place 0.4 mg under the tongue every 5 (five) minutes as needed for chest pain.   omeprazole 20 MG tablet Commonly known as:  PRILOSEC OTC Take 20 mg by mouth. Monday, Wednesday, & Friday evening.   potassium chloride 10 MEQ CR tablet Commonly known as:  KLOR-CON Take 10 mEq by mouth daily.   Pramox-PE-Glycerin-Petrolatum 1-0.25-14.4-15 % Crea Commonly known as:  PREPARATION H Use daily prn What changed:    how much to take  how to take this  when to take this  reasons to take this  additional instructions   predniSONE 20 MG tablet Commonly known as:  DELTASONE Take 2 tablets (40 mg total) by mouth daily with breakfast for 3 days. Start taking on:  06/24/2017   PROBIOTIC DAILY PO Take 1 tablet by mouth every morning.       Procedures/Studies: Dg Chest 2 View  Result Date: 06/21/2017 CLINICAL DATA:  Cough, shortness of Breath EXAM: CHEST  2 VIEW COMPARISON:  12/04/2016 FINDINGS: Cardiomegaly. Platelike atelectasis in the right mid lung. Left lung clear. No effusions or edema. No acute bony abnormality. IMPRESSION: Right midlung atelectasis.  Cardiomegaly. Electronically Signed   By: Caryn BeeKevin  Dover M.D.   On: 06/21/2017 22:34   Dg Thoracic Spine W/swimmers  Result Date: 06/21/2017 CLINICAL DATA:  Upper back pain EXAM: THORACIC SPINE - 3 VIEWS COMPARISON:  Chest x-ray today and 12/04/2016. Two-view chest x-ray 09/10/2016 FINDINGS: Multiple moderate compression fractures in the midthoracic spine, likely T6-T8. The T7  compression fracture has progressed since prior study. The other compression fractures are stable. Stable mild compression fracture at T10 and superior endplate of T11, stable. No acute fracture. IMPRESSION: Chronic compression fractures in the mid and lower thoracic spine, with slight worsening at T7. Otherwise no change. Electronically Signed   By: Charlett Nose M.D.   On: 06/21/2017 22:36      Subjective: Pt says she feels much better today.    Discharge Exam: Vitals:   06/23/17 0852 06/23/17 0945  BP: (!) 88/50   Pulse: 81 (!) 145  Resp: 20   Temp: 97.8 F (36.6 C)   SpO2: 98% 100%   Vitals:   06/23/17 0500 06/23/17 0730 06/23/17 0852 06/23/17 0945  BP: 100/84  (!) 88/50   Pulse: 70  81 (!) 145  Resp: 20  20   Temp: (!) 97.4 F (36.3 C)  97.8 F (36.6 C)   TempSrc: Oral  Oral   SpO2: 97% 98% 98% 100%  Weight: 60.3 kg (132 lb 15 oz)     Height:       General: Pt is alert, awake, not in acute distress Cardiovascular: normal S1/S2 +, no rubs, no gallops Respiratory: CTA bilaterally, no wheezing, no rhonchi Abdominal: Soft, NT, ND, bowel sounds + Extremities: no edema, no cyanosis   The results of significant diagnostics from this hospitalization (including imaging, microbiology, ancillary and laboratory) are listed below for reference.    Microbiology: Recent Results (from the past 240 hour(s))  MRSA PCR Screening     Status: None   Collection Time: 06/22/17  1:55 AM  Result Value Ref Range Status   MRSA by PCR NEGATIVE NEGATIVE Final    Comment:        The GeneXpert MRSA Assay (FDA approved for NASAL specimens only), is one component of a comprehensive MRSA colonization surveillance program. It is not intended to diagnose MRSA infection nor to guide or monitor treatment for MRSA infections.      Labs: BNP (last 3 results) Recent Labs    09/16/16 1924 12/04/16 1519 06/21/17 2113  BNP 217.0* 135.0* 47.0   Basic Metabolic Panel: Recent Labs  Lab  06/21/17 2344 06/21/17 2349  NA 140 139  K 4.8 4.0  CL 97* 101  CO2 28  --   GLUCOSE 132* 132*  BUN 25* 24*  CREATININE 0.81 0.80  CALCIUM 11.3*  --   MG 2.3  --    Liver Function Tests: No results for input(s): AST, ALT, ALKPHOS, BILITOT, PROT, ALBUMIN in the last 168 hours. No results for input(s): LIPASE, AMYLASE in the last 168 hours. No results for input(s): AMMONIA in the last 168 hours. CBC: Recent Labs  Lab 06/21/17 2113 06/21/17 2349  WBC 13.6*  --   NEUTROABS 11.0*  --   HGB 16.3* 17.3*  HCT 50.6* 51.0*  MCV 89.1  --   PLT 197  --    Cardiac Enzymes: Recent Labs  Lab 06/21/17 2344  TROPONINI <0.03   BNP: Invalid input(s): POCBNP CBG: No results for input(s): GLUCAP in the last 168 hours. D-Dimer No results for input(s): DDIMER in the last 72 hours. Hgb A1c No results for  input(s): HGBA1C in the last 72 hours. Lipid Profile No results for input(s): CHOL, HDL, LDLCALC, TRIG, CHOLHDL, LDLDIRECT in the last 72 hours. Thyroid function studies Recent Labs    06/21/17 2344  TSH 5.004*   Anemia work up No results for input(s): VITAMINB12, FOLATE, FERRITIN, TIBC, IRON, RETICCTPCT in the last 72 hours. Urinalysis    Component Value Date/Time   COLORURINE YELLOW 06/21/2017 2111   APPEARANCEUR HAZY (A) 06/21/2017 2111   LABSPEC 1.027 06/21/2017 2111   PHURINE 5.0 06/21/2017 2111   GLUCOSEU NEGATIVE 06/21/2017 2111   HGBUR NEGATIVE 06/21/2017 2111   BILIRUBINUR NEGATIVE 06/21/2017 2111   KETONESUR NEGATIVE 06/21/2017 2111   PROTEINUR NEGATIVE 06/21/2017 2111   UROBILINOGEN 0.2 04/28/2012 1830   NITRITE NEGATIVE 06/21/2017 2111   LEUKOCYTESUR LARGE (A) 06/21/2017 2111   Sepsis Labs Invalid input(s): PROCALCITONIN,  WBC,  LACTICIDVEN Microbiology Recent Results (from the past 240 hour(s))  MRSA PCR Screening     Status: None   Collection Time: 06/22/17  1:55 AM  Result Value Ref Range Status   MRSA by PCR NEGATIVE NEGATIVE Final    Comment:         The GeneXpert MRSA Assay (FDA approved for NASAL specimens only), is one component of a comprehensive MRSA colonization surveillance program. It is not intended to diagnose MRSA infection nor to guide or monitor treatment for MRSA infections.    Time coordinating discharge: 35 mins  SIGNED:  Standley Dakins, MD  [facility] 06/23/2017, 12:01 PM Pager [phone]  If 7PM-7AM, please contact night-coverage [email or web address] Password [email or web address]

## 2017-06-23 NOTE — Evaluation (Signed)
Physical Therapy Evaluation Patient Details Name: Deborah [name] MRN: [id number] DOB: 03-19-1926 Today's Date: 06/23/2017   History of Present Illness  Lavonna RuaSara Sculley is an 84yo white female [facility] with thoracic back pain, persistent cough, worse DOE, noted to have had PNa 1 WA, and now in a  COPD exacerbation d/t bronchitis/PNA. PMH: AF off anticoagulation d/t falls, CHF, dementia (early), COPD, seizures, hypoTSH,. macular degeneration. At baseline, pt lives at [facility], AMB with SPC/RW 1 miles per dail within the facility per daughter, and is Independent in ADL. Of note: PMH includes chronic vertebral fractures, imagign this admission demosntrating progression of T7, T6, 8, 10, 11 stable and chronic.   Clinical Impression  Pt admitted with above diagnosis. Pt currently with functional limitations due to the deficits listed below (see "PT Problem List"). Pt up in chair with nursing, daughter in room. Pt performs all functional mobility at supervision to [place] assist, good safety awareness, but reports to be about 25-50% as strong as her baseline. Pt received seated in AR with RVR, HR ranging from 100s-120s, which increases to 120s-140s with AMB, without dizziness or CP: cardiology made aware, in room at [facility]. Pt has worked with [facility] at [facility] before and has made good progress: this is a good option for the patient in her current state. Pt will benefit from skilled PT intervention to increase independence and safety with basic mobility in preparation for discharge to the venue listed below.       Follow Up Recommendations Home health PT    Equipment Recommendations  None recommended by PT    Recommendations for Other Services       Precautions / Restrictions Precautions Precautions: Fall Precaution Comments: falls history, macular degeneration, chronic hypotension, uncontrolled AF.  Restrictions Weight Bearing Restrictions: No      Mobility  Bed Mobility                General bed mobility comments: received sitting in chair  Transfers Overall transfer level: Modified independent Equipment used: None                Ambulation/Gait Ambulation/Gait assistance: Supervision Ambulation Distance (Feet): 350 Feet Assistive device: Rolling walker (2 wheeled)(req 1 hand help assist when without AD )   Gait velocity: 0.2172m/s  Gait velocity interpretation: <1.8 ft/sec, indicative of risk for recurrent falls General Gait Details: safe use of RW during straight plane, slow with turns but steady and safe; No correlating complaints with tachycardia, in AF 120s-140s during AMB.   Stairs            Wheelchair Mobility    Modified Rankin (Stroke Patients Only)       Balance Overall balance assessment: Needs assistance;History of Falls;No apparent balance deficits (not formally assessed)         Standing balance support: During functional activity Standing balance-Leahy Scale: Good Standing balance comment: AMB is satedy and safe with RW, but unable to balance without use of arms                             Pertinent Vitals/Pain Pain Assessment: No/denies pain    Home Living Family/patient expects to be discharged to:: Assisted living               Home Equipment: Gilmer MorCane - single point;Walker - 2 wheels      Prior Function Level of Independence: Independent with assistive device(s)  Comments: SPC to RW AMB in facility up to 1 mile daily.      Hand Dominance   Dominant Hand: Right    Extremity/Trunk Assessment        Lower Extremity Assessment Lower Extremity Assessment: Generalized weakness;Overall [facility] for tasks assessed    Cervical / Trunk Assessment Cervical / Trunk Assessment: Kyphotic(mild kyphosis standing. )  Communication   Communication: No difficulties  Cognition Arousal/Alertness: Awake/alert Behavior During Therapy: WFL for tasks assessed/performed Overall Cognitive  Status: Within Functional Limits for tasks assessed                                        General Comments      Exercises     Assessment/Plan    PT Assessment Patient needs continued PT services  PT Problem List Decreased strength;Decreased activity tolerance;Cardiopulmonary status limiting activity;Decreased balance       PT Treatment Interventions Functional mobility training;Therapeutic activities;Therapeutic exercise;Patient/family education;Gait training;Balance training    PT Goals (Current goals can be found in the Care Plan section)  Acute Rehab PT Goals Patient Stated Goal: regain ability to walk daily for exercise  PT Goal Formulation: With patient Time For Goal Achievement: 07/07/17 Potential to Achieve Goals: Good    Frequency Min 3X/week   Barriers to discharge        Co-evaluation               AM-PAC PT "6 Clicks" Daily Activity  Outcome Measure Difficulty turning over in bed (including adjusting bedclothes, sheets and blankets)?: A Little Difficulty moving from lying on back to sitting on the side of the bed? : A Little Difficulty sitting down on and standing up from a chair with arms (e.g., wheelchair, bedside commode, etc,.)?: A Little Help needed moving to and from a bed to chair (including a wheelchair)?: A Little Help needed walking in hospital room?: A Little Help needed climbing 3-5 steps with a railing? : A Little 6 Click Score: 18    End of Session Equipment Utilized During Treatment: Gait belt Activity Tolerance: Patient tolerated treatment well;Patient limited by fatigue(intermittent leg fatigue. does not require stopping torest) Patient left: in chair;with call bell/phone within reach;with family/visitor present Nurse Communication: Mobility status PT Visit Diagnosis: Unsteadiness on feet (R26.81);Difficulty in walking, not elsewhere classified (R26.2)    Time: 1610-9604 PT Time Calculation (min) (ACUTE ONLY): 15  min   Charges:   PT Evaluation $PT Eval Moderate Complexity: 1 Mod PT Treatments $Therapeutic Activity: 8-22 mins   PT G Codes:        10:13 AM, July 06, 2017 Rosamaria Lints, PT, DPT Physical Therapist - [place] [phone] (Office)   Audreyanna Butkiewicz C 07-06-2017, 10:10 AM

## 2017-06-23 NOTE — Progress Notes (Signed)
Discharge instructions read to facility staff member Laurelyn SickleMichelle Ecols  [facility]

## 2017-06-23 NOTE — [place] FL2 (Signed)
[place] MEDICAID FL2 LEVEL OF CARE SCREENING TOOL     IDENTIFICATION  Patient Name: BARBEE MAMULA Birthdate: 1926/01/17 Sex: female Admission Date (Current Location): 06/21/2017  [facility] and [place] Number:  [employer] and Address:  [facility],  [address], [place] [address]      Provider Number: [id number]  Attending Physician Name and Address:  Cleora Fleet, MD  Relative Name and Phone Number:  Lonn Georgia (daughter) [phone]    Current Level of Care: Hospital Recommended Level of Care: Assisted Living Facility Prior Approval Number: [id number]  Date Approved/Denied: 04/23/12 PASRR Number:    Discharge Plan: Other (Comment)(ALF)    Current Diagnoses: Patient Active Problem List   Diagnosis Date Noted  . Compression fracture of body of thoracic vertebra (HCC) 06/22/2017  . Atrial fibrillation with rapid ventricular response (HCC)   . Screening for colorectal cancer 03/24/2017  . Hemorrhoids 03/24/2017  . Itching in the vaginal area 03/24/2017  . Vulval thrush 03/24/2017  . Hypoxemia   . COPD with acute exacerbation (HCC) 09/13/2016  . Alzheimer disease 09/13/2016  . Seizures (HCC) 09/13/2016  . Hypokalemia 09/13/2016  . Atrial fibrillation with RVR (HCC) 09/13/2016  . COPD exacerbation (HCC) 09/13/2016  . Hypotension 04/22/2012  . Coronary atherosclerosis of native coronary artery   . COPD (chronic obstructive pulmonary disease) (HCC)   . Mixed hyperlipidemia 05/14/2009  . Permanent atrial fibrillation (HCC) 02/10/2009  . DIASTOLIC HEART FAILURE, CHRONIC 02/10/2009    Orientation RESPIRATION BLADDER Height & Weight     Self, Situation, Place    Continent Weight: 132 lb 15 oz (60.3 kg) Height:  5\' 5"  (165.1 cm)  BEHAVIORAL SYMPTOMS/MOOD NEUROLOGICAL BOWEL NUTRITION STATUS      Continent Diet(regular)  AMBULATORY STATUS COMMUNICATION OF NEEDS Skin   Independent Verbally Normal                        Personal Care Assistance Level of Assistance  Bathing, Feeding, Dressing Bathing Assistance: Independent Feeding assistance: Independent Dressing Assistance: Independent     Functional Limitations Info  Sight, Hearing, Speech Sight Info: Impaired(macular degeneration) Hearing Info: Adequate Speech Info: Adequate    SPECIAL CARE FACTORS FREQUENCY  PT (By licensed PT)     PT Frequency: 3x/week              Contractures Contractures Info: Not present    Additional Factors Info  Code Status, Allergies Code Status Info: DNR Allergies Info: Icp dye, omnipaque, slufa antibiotics, carbamazepine, dilaudid, tramadol           Current Medications (06/23/2017):  This is the current hospital active medication list Current Facility-Administered Medications  Medication Dose Route Frequency Provider Last Rate Last Dose  . 0.9 %  sodium chloride infusion  250 mL Intravenous PRN Maurilio Lovely D, DO 10 mL/hr at 06/22/17 1501 250 mL at 06/22/17 1501  . acetaminophen (TYLENOL) tablet 500 mg  500 mg Oral Q6H PRN Maurilio Lovely D, DO      . aspirin EC tablet 81 mg  81 mg Oral Daily Sherryll Burger, Pratik D, DO   81 mg at 06/23/17 1036  . atorvastatin (LIPITOR) tablet 40 mg  40 mg Oral Once per day on Mon Wed Fri Shah, Pratik D, DO      . azithromycin [facility]) tablet 250 mg  250 mg Oral Daily Sherryll Burger, Pratik D, DO   250 mg at 06/23/17 1037  . calcium-vitamin D 500-200 MG-UNIT per tablet  1 tablet  1 tablet Oral BID Laural Benes, Clanford L, MD   1 tablet at 06/22/17 1038  . digoxin (LANOXIN) tablet 0.125 mg  0.125 mg Oral QPM Shah, Pratik D, DO   0.125 mg at 06/22/17 1742  . diltiazem (CARDIZEM) tablet 30 mg  30 mg Oral Q6H Shah, Pratik D, DO   30 mg at 06/23/17 1214  . docusate sodium (COLACE) capsule 100 mg  100 mg Oral QHS Shah, Pratik D, DO   100 mg at 06/22/17 2257  . enoxaparin (LOVENOX) injection 40 mg  40 mg Subcutaneous Q24H Sherryll Burger, Pratik D, DO   40 mg at 06/23/17 1035  . fluticasone (FLONASE) 50  MCG/ACT nasal spray 2 spray  2 spray Each Nare Daily Sherryll Burger, Pratik D, DO   2 spray at 06/23/17 1131  . furosemide (LASIX) tablet 10 mg  10 mg Oral BID Maurilio Lovely D, DO   10 mg at 06/23/17 0808  . guaiFENesin (MUCINEX) 12 hr tablet 1,200 mg  1,200 mg Oral BID Sherryll Burger, Pratik D, DO   1,200 mg at 06/23/17 1037  . ipratropium (ATROVENT) nebulizer solution 0.5 mg  0.5 mg Nebulization TID Johnson, Clanford L, MD      . levalbuterol (XOPENEX) nebulizer solution 0.63 mg  0.63 mg Nebulization TID Laural Benes, Clanford L, MD      . levETIRAcetam (KEPPRA) tablet 125 mg  125 mg Oral Q24H Shah, Pratik D, DO   125 mg at 06/22/17 1447  . levETIRAcetam (KEPPRA) tablet 250 mg  250 mg Oral QAC breakfast Maurilio Lovely D, DO   250 mg at 06/23/17 1610  . levothyroxine (SYNTHROID, LEVOTHROID) tablet 25 mcg  25 mcg Oral QAC breakfast Maurilio Lovely D, DO   25 mcg at 06/23/17 9604  . midodrine (PROAMATINE) tablet 2.5 mg  2.5 mg Oral BID WC Sherryll Burger, Pratik D, DO   2.5 mg at 06/23/17 0810  . mometasone (ELOCON) 0.1 % cream 1 application  1 application Topical Daily Sherryll Burger, Pratik D, DO   1 application at 06/23/17 1133  . multivitamin-lutein (OCUVITE-LUTEIN) capsule 1 capsule  1 capsule Oral BID Sherryll Burger, Pratik D, DO   1 capsule at 06/23/17 1036  . MUSCLE RUB CREA   Topical PRN Maurilio Lovely D, DO   1 application at 06/22/17 0957  . nitroGLYCERIN (NITROSTAT) SL tablet 0.4 mg  0.4 mg Sublingual Q5 min PRN Sherryll Burger, Pratik D, DO      . ondansetron (ZOFRAN) tablet 4 mg  4 mg Oral Q6H PRN Sherryll Burger, Pratik D, DO       Or  . ondansetron (ZOFRAN) injection 4 mg  4 mg Intravenous Q6H PRN Sherryll Burger, Pratik D, DO      . pantoprazole (PROTONIX) EC tablet 40 mg  40 mg Oral Once per day on Mon Wed Fri Shah, Pratik D, DO      . potassium chloride SA (K-DUR,KLOR-CON) CR tablet 10 mEq  10 mEq Oral Daily Sherryll Burger, Pratik D, DO   10 mEq at 06/23/17 1037  . predniSONE (DELTASONE) tablet 40 mg  40 mg Oral Q breakfast Maurilio Lovely D, DO   40 mg at 06/23/17 0806  . sodium  chloride flush (NS) 0.9 % injection 3 mL  3 mL Intravenous Q12H Shah, Pratik D, DO   3 mL at 06/23/17 1036  . sodium chloride flush (NS) 0.9 % injection 3 mL  3 mL Intravenous PRN Maurilio Lovely D, DO         Discharge Medications: Medication List  STOP taking these medications   moxifloxacin 400 MG tablet Commonly known as:  AVELOX     TAKE these medications   acetaminophen 500 MG tablet Commonly known as:  TYLENOL Take 500 mg by mouth every 6 (six) hours as needed for mild pain. For pain   albuterol 108 (90 Base) MCG/ACT inhaler Commonly known as:  PROVENTIL HFA;VENTOLIN HFA Inhale 2 puffs into the lungs every 6 (six) hours as needed for wheezing or shortness of breath.   albuterol (2.5 MG/3ML) 0.083% nebulizer solution Commonly known as:  PROVENTIL Take 2.5 mg by nebulization every 6 (six) hours as needed for wheezing or shortness of breath.   alendronate 70 MG tablet Commonly known as:  FOSAMAX Take 70 mg by mouth every Wednesday. Take with a full glass of water on an empty stomach.   aspirin 81 MG EC tablet Commonly known as:  aspirin EC Take 1 tablet (81 mg total) by mouth daily. Swallow whole.   atorvastatin 40 MG tablet Commonly known as:  LIPITOR Take 40 mg by mouth. Monday, Wednesday, & Friday evening.   azithromycin 250 MG tablet Commonly known as:  ZITHROMAX Take 1 tablet (250 mg total) by mouth daily for 4 days.   calcium citrate-vitamin D 500-400 MG-UNIT chewable tablet Chew 1 tablet by mouth 2 (two) times daily.   digoxin 0.125 MG tablet Commonly known as:  LANOXIN Take 1 tablet (0.125 mg total) by mouth every evening.   diltiazem 30 MG tablet Commonly known as:  CARDIZEM Take 1 tablet (30 mg total) by mouth every 6 (six) hours.   docusate sodium 100 MG capsule Commonly known as:  COLACE Take 100 mg by mouth at bedtime.   fluticasone 50 MCG/ACT nasal spray Commonly known as:  FLONASE Place 2 sprays into both nostrils daily.    furosemide 20 MG tablet Commonly known as:  LASIX Take 10 mg by mouth 2 (two) times daily.   guaiFENesin 600 MG 12 hr tablet Commonly known as:  MUCINEX Take 2 tablets (1,200 mg total) by mouth 2 (two) times daily.   ICAPS Caps Take 1 capsule by mouth 2 (two) times daily.   ICY HOT 5 % Ptch Generic drug:  Menthol Apply 1 application topically daily. Apply to top of back   levETIRAcetam 250 MG tablet Commonly known as:  KEPPRA Take 125-250 mg by mouth See admin instructions. Takes one tablet in the morning and half a tablet at 3 pm.   levothyroxine 25 MCG tablet Commonly known as:  SYNTHROID, LEVOTHROID Take 25 mcg by mouth daily before breakfast.   midodrine 2.5 MG tablet Commonly known as:  PROAMATINE Take 1 tablet (2.5 mg total) by mouth 2 (two) times daily with a meal. The dosing can be titrated down to once daily when her systolic blood pressure is consistently in the 100s.   MIN-O-EAR Liqd Place 2 drops in ear(s) every Monday. At bedtime for stopped up ears   mometasone 0.1 % cream Commonly known as:  ELOCON Apply 1 application topically daily. To ears for eczema   nitroGLYCERIN 0.4 MG SL tablet Commonly known as:  NITROSTAT Place 0.4 mg under the tongue every 5 (five) minutes as needed for chest pain.   omeprazole 20 MG tablet Commonly known as:  PRILOSEC OTC Take 20 mg by mouth. Monday, Wednesday, & Friday evening.   potassium chloride 10 MEQ CR tablet Commonly known as:  KLOR-CON Take 10 mEq by mouth daily.   Pramox-PE-Glycerin-Petrolatum 1-0.25-14.4-15 % Crea Commonly known as:  PREPARATION H Use daily prn What changed:    how much to take  how to take this  when to take this  reasons to take this  additional instructions   predniSONE 20 MG tablet Commonly known as:  DELTASONE Take 2 tablets (40 mg total) by mouth daily with breakfast for 3 days. Start taking on:  06/24/2017   PROBIOTIC DAILY PO Take 1 tablet by mouth every  morning.          Relevant Imaging Results:  Relevant Lab Results:   Additional Information SSN: [id number]  Alyssia Heese, Juleen China, LCSW

## 2017-06-23 NOTE — Care Management (Signed)
Maralyn SagoSarah, [facility] She will obtain from Epic.

## 2017-06-23 NOTE — Progress Notes (Signed)
Progress Note  Patient Name: Deborah [name] Date of Encounter: 06/23/2017  Primary Cardiologist: Dr. Jonelle Sidle  Subjective   Feels better, less back pain.  No palpitations or chest pain.  Tolerated walking in the hall this morning with PT.  Inpatient Medications    Scheduled Meds: . aspirin EC  81 mg Oral Daily  . atorvastatin  40 mg Oral Once per day on Mon Wed Fri  . azithromycin  250 mg Oral Daily  . calcium-vitamin D  1 tablet Oral BID  . digoxin  0.125 mg Oral QPM  . diltiazem  30 mg Oral Q6H  . docusate sodium  100 mg Oral QHS  . enoxaparin (LOVENOX) injection  40 mg Subcutaneous Q24H  . fluticasone  2 spray Each Nare Daily  . furosemide  10 mg Oral BID  . guaiFENesin  1,200 mg Oral BID  . ipratropium  0.5 mg Nebulization TID  . levalbuterol  0.63 mg Nebulization TID  . levETIRAcetam  125 mg Oral Q24H  . levETIRAcetam  250 mg Oral QAC breakfast  . levothyroxine  25 mcg Oral QAC breakfast  . midodrine  2.5 mg Oral BID WC  . mometasone  1 application Topical Daily  . multivitamin-lutein  1 capsule Oral BID  . pantoprazole  40 mg Oral Once per day on Mon Wed Fri  . potassium chloride  10 mEq Oral Daily  . predniSONE  40 mg Oral Q breakfast  . sodium chloride flush  3 mL Intravenous Q12H   Continuous Infusions: . sodium chloride 250 mL (06/22/17 1501)   PRN Meds: sodium chloride, acetaminophen, MUSCLE RUB, nitroGLYCERIN, ondansetron **OR** ondansetron (ZOFRAN) IV, sodium chloride flush   Vital Signs    Vitals:   06/22/17 2107 06/23/17 0500 06/23/17 0730 06/23/17 0852  BP:  100/84  (!) 88/50  Pulse:  70  81  Resp:  20  20  Temp:  (!) 97.4 F (36.3 C)  97.8 F (36.6 C)  TempSrc:  Oral  Oral  SpO2:  97% 98% 98%  Weight: 132 lb 11.5 oz (60.2 kg) 132 lb 15 oz (60.3 kg)    Height:        Intake/Output Summary (Last 24 hours) at 06/23/2017 0956 Last data filed at 06/23/2017 0902 Gross per 24 hour  Intake 603.83 ml  Output 20 ml  Net 583.83 ml    Filed Weights   06/22/17 0400 06/22/17 2107 06/23/17 0500  Weight: 131 lb 2.8 oz (59.5 kg) 132 lb 11.5 oz (60.2 kg) 132 lb 15 oz (60.3 kg)    Telemetry    Atrial fibrillation with intermittent RVR.  Personally reviewed.  Physical Exam   GEN: No acute distress.   Neck: No JVD. Cardiac:  Irregularly irregular, no gallop.  Respiratory:  Decreased breath sounds, no wheezing. GI: Soft, nontender, bowel sounds present. MS: No edema; Kyphosis. Neuro:  Nonfocal. Psych: Alert and oriented x 3. Normal affect.  Labs    Chemistry Recent Labs  Lab 06/21/17 2344 06/21/17 2349  NA 140 139  K 4.8 4.0  CL 97* 101  CO2 28  --   GLUCOSE 132* 132*  BUN 25* 24*  CREATININE 0.81 0.80  CALCIUM 11.3*  --   GFRNONAA >60  --   GFRAA >60  --   ANIONGAP 15  --      Hematology Recent Labs  Lab 06/21/17 2113 06/21/17 2349  WBC 13.6*  --   RBC 5.68*  --   HGB 16.3*  17.3*  HCT 50.6* 51.0*  MCV 89.1  --   MCH 28.7  --   MCHC 32.2  --   RDW 14.9  --   PLT 197  --     Cardiac Enzymes Recent Labs  Lab 06/21/17 2344  TROPONINI <0.03    Recent Labs  Lab 06/21/17 2347  TROPIPOC 0.01     BNP Recent Labs  Lab 06/21/17 2113  BNP 47.0     Radiology    Dg Chest 2 View  Result Date: 06/21/2017 CLINICAL DATA:  Cough, shortness of Breath EXAM: CHEST  2 VIEW COMPARISON:  12/04/2016 FINDINGS: Cardiomegaly. Platelike atelectasis in the right mid lung. Left lung clear. No effusions or edema. No acute bony abnormality. IMPRESSION: Right midlung atelectasis.  Cardiomegaly. Electronically Signed   By: Charlett NoseKevin  Dover M.D.   On: 06/21/2017 22:34   Dg Thoracic Spine W/swimmers  Result Date: 06/21/2017 CLINICAL DATA:  Upper back pain EXAM: THORACIC SPINE - 3 VIEWS COMPARISON:  Chest x-ray today and 12/04/2016. Two-view chest x-ray 09/10/2016 FINDINGS: Multiple moderate compression fractures in the midthoracic spine, likely T6-T8. The T7 compression fracture has progressed since prior  study. The other compression fractures are stable. Stable mild compression fracture at T10 and superior endplate of T11, stable. No acute fracture. IMPRESSION: Chronic compression fractures in the mid and lower thoracic spine, with slight worsening at T7. Otherwise no change. Electronically Signed   By: Charlett NoseKevin  Dover M.D.   On: 06/21/2017 22:36    Patient Profile     84 y.o. female with a history of chronic atrial fibrillation, CAD status post BMS to the circumflex in 2009, hypotension on midodrine, and hyperlipidemia.  Assessment & Plan    1.  Chronic atrial fibrillation with intermittent RVR, multifactorial.  Would recommend continuing present cardiac regimen including current doses of digoxin and short acting Cardizem which she has tolerated fairly well over time.  She is not anticoagulated with history of falls and previous retroperitoneal hemorrhage.  2.  Chronic hypotension, on midodrine.  3.  Recent pneumonia.  4.  Dementia.  5.  CAD status post BMS to the circumflex in 2009.  No angina symptoms at present.  No additional cardiac testing is planned.  Would continue with Cardizem 30 mg p.o. every 6 hours and Lanoxin 0.125 mg daily.  Also continue Midodrine. We will sign off.  Signed, Deborah DellSamuel Lavonn Maxcy, MD  06/23/2017, 9:56 AM

## 2017-06-23 NOTE — [place] FL2 (Deleted)
MEDICAID FL2 LEVEL OF CARE SCREENING TOOL     IDENTIFICATION  Patient Name: Deborah [name] Birthdate: April 04, 1926 Sex: female Admission Date (Current Location): 06/21/2017  [facility] and [place] Number:  [employer] and Address:  [facility],  [address] [place] [address]      Provider Number: [id number]  Attending Physician Name and Address:  Cleora Fleet, MD  Relative Name and Phone Number:  Lonn Georgia (daughter) [phone]    Current Level of Care: Hospital Recommended Level of Care: Assisted Living Facility Prior Approval Number: [id number]  Date Approved/Denied: 04/23/12 PASRR Number:    Discharge Plan: Other (Comment)(ALF)    Current Diagnoses: Patient Active Problem List   Diagnosis Date Noted  . Compression fracture of body of thoracic vertebra (HCC) 06/22/2017  . Atrial fibrillation with rapid ventricular response (HCC)   . Screening for colorectal cancer 03/24/2017  . Hemorrhoids 03/24/2017  . Itching in the vaginal area 03/24/2017  . Vulval thrush 03/24/2017  . Hypoxemia   . COPD with acute exacerbation (HCC) 09/13/2016  . Alzheimer disease 09/13/2016  . Seizures (HCC) 09/13/2016  . Hypokalemia 09/13/2016  . Atrial fibrillation with RVR (HCC) 09/13/2016  . COPD exacerbation (HCC) 09/13/2016  . Hypotension 04/22/2012  . Coronary atherosclerosis of native coronary artery   . COPD (chronic obstructive pulmonary disease) (HCC)   . Mixed hyperlipidemia 05/14/2009  . Permanent atrial fibrillation (HCC) 02/10/2009  . DIASTOLIC HEART FAILURE, CHRONIC 02/10/2009    Orientation RESPIRATION BLADDER Height & Weight     Self, Situation, Place    Continent Weight: 132 lb 15 oz (60.3 kg) Height:  5\' 5"  (165.1 cm)  BEHAVIORAL SYMPTOMS/MOOD NEUROLOGICAL BOWEL NUTRITION STATUS      Continent Diet(regular)  AMBULATORY STATUS COMMUNICATION OF NEEDS Skin   Independent Verbally Normal                        Personal Care Assistance Level of Assistance  Bathing, Feeding, Dressing Bathing Assistance: Independent Feeding assistance: Independent Dressing Assistance: Independent     Functional Limitations Info  Sight, Hearing, Speech Sight Info: Impaired(macular degeneration) Hearing Info: Adequate Speech Info: Adequate    SPECIAL CARE FACTORS FREQUENCY                       Contractures Contractures Info: Not present    Additional Factors Info  Code Status, Allergies Code Status Info: DNR Allergies Info: Icp dye, omnipaque, slufa antibiotics, carbamazepine, dilaudid, tramadol           Current Medications (06/23/2017):  This is the current hospital active medication list Current Facility-Administered Medications  Medication Dose Route Frequency Provider Last Rate Last Dose  . 0.9 %  sodium chloride infusion  250 mL Intravenous PRN Maurilio Lovely D, DO 10 mL/hr at 06/22/17 1501 250 mL at 06/22/17 1501  . acetaminophen (TYLENOL) tablet 500 mg  500 mg Oral Q6H PRN Maurilio Lovely D, DO      . aspirin EC tablet 81 mg  81 mg Oral Daily Sherryll Burger, Pratik D, DO   81 mg at 06/23/17 1036  . atorvastatin (LIPITOR) tablet 40 mg  40 mg Oral Once per day on Mon Wed Fri Shah, Pratik D, DO      . azithromycin [facility]) tablet 250 mg  250 mg Oral Daily Sherryll Burger, Pratik D, DO   250 mg at 06/23/17 1037  . calcium-vitamin D 500-200 MG-UNIT per tablet 1 tablet  1 tablet Oral BID Laural BenesJohnson, Clanford L, MD   1 tablet at 06/22/17 1038  . digoxin (LANOXIN) tablet 0.125 mg  0.125 mg Oral QPM Shah, Pratik D, DO   0.125 mg at 06/22/17 1742  . diltiazem (CARDIZEM) tablet 30 mg  30 mg Oral Q6H Shah, Pratik D, DO   30 mg at 06/23/17 1214  . docusate sodium (COLACE) capsule 100 mg  100 mg Oral QHS Shah, Pratik D, DO   100 mg at 06/22/17 2257  . enoxaparin (LOVENOX) injection 40 mg  40 mg Subcutaneous Q24H Sherryll BurgerShah, Pratik D, DO   40 mg at 06/23/17 1035  . fluticasone (FLONASE) 50 MCG/ACT nasal spray 2 spray  2 spray Each  Nare Daily Sherryll BurgerShah, Pratik D, DO   2 spray at 06/23/17 1131  . furosemide (LASIX) tablet 10 mg  10 mg Oral BID Maurilio LovelyShah, Pratik D, DO   10 mg at 06/23/17 0808  . guaiFENesin (MUCINEX) 12 hr tablet 1,200 mg  1,200 mg Oral BID Sherryll BurgerShah, Pratik D, DO   1,200 mg at 06/23/17 1037  . ipratropium (ATROVENT) nebulizer solution 0.5 mg  0.5 mg Nebulization TID Johnson, Clanford L, MD      . levalbuterol (XOPENEX) nebulizer solution 0.63 mg  0.63 mg Nebulization TID Laural BenesJohnson, Clanford L, MD      . levETIRAcetam (KEPPRA) tablet 125 mg  125 mg Oral Q24H Shah, Pratik D, DO   125 mg at 06/22/17 1447  . levETIRAcetam (KEPPRA) tablet 250 mg  250 mg Oral QAC breakfast Maurilio LovelyShah, Pratik D, DO   250 mg at 06/23/17 [id number]  . levothyroxine (SYNTHROID, LEVOTHROID) tablet 25 mcg  25 mcg Oral QAC breakfast Maurilio LovelyShah, Pratik D, DO   25 mcg at 06/23/17 [id number]  . midodrine (PROAMATINE) tablet 2.5 mg  2.5 mg Oral BID WC Sherryll BurgerShah, Pratik D, DO   2.5 mg at 06/23/17 0810  . mometasone (ELOCON) 0.1 % cream 1 application  1 application Topical Daily Sherryll BurgerShah, Pratik D, DO   1 application at 06/23/17 1133  . multivitamin-lutein (OCUVITE-LUTEIN) capsule 1 capsule  1 capsule Oral BID Sherryll BurgerShah, Pratik D, DO   1 capsule at 06/23/17 1036  . MUSCLE RUB CREA   Topical PRN Maurilio LovelyShah, Pratik D, DO   1 application at 06/22/17 0957  . nitroGLYCERIN (NITROSTAT) SL tablet 0.4 mg  0.4 mg Sublingual Q5 min PRN Sherryll BurgerShah, Pratik D, DO      . ondansetron (ZOFRAN) tablet 4 mg  4 mg Oral Q6H PRN Sherryll BurgerShah, Pratik D, DO       Or  . ondansetron (ZOFRAN) injection 4 mg  4 mg Intravenous Q6H PRN Sherryll BurgerShah, Pratik D, DO      . pantoprazole (PROTONIX) EC tablet 40 mg  40 mg Oral Once per day on Mon Wed Fri Shah, Pratik D, DO      . potassium chloride SA (K-DUR,KLOR-CON) CR tablet 10 mEq  10 mEq Oral Daily Sherryll BurgerShah, Pratik D, DO   10 mEq at 06/23/17 1037  . predniSONE (DELTASONE) tablet 40 mg  40 mg Oral Q breakfast Maurilio LovelyShah, Pratik D, DO   40 mg at 06/23/17 0806  . sodium chloride flush (NS) 0.9 % injection 3 mL  3 mL  Intravenous Q12H Shah, Pratik D, DO   3 mL at 06/23/17 1036  . sodium chloride flush (NS) 0.9 % injection 3 mL  3 mL Intravenous PRN Maurilio LovelyShah, Pratik D, DO         Discharge Medications: Medication List     STOP  taking these medications   moxifloxacin 400 MG tablet Commonly known as:  AVELOX     TAKE these medications   acetaminophen 500 MG tablet Commonly known as:  TYLENOL Take 500 mg by mouth every 6 (six) hours as needed for mild pain. For pain   albuterol 108 (90 Base) MCG/ACT inhaler Commonly known as:  PROVENTIL HFA;VENTOLIN HFA Inhale 2 puffs into the lungs every 6 (six) hours as needed for wheezing or shortness of breath.   albuterol (2.5 MG/3ML) 0.083% nebulizer solution Commonly known as:  PROVENTIL Take 2.5 mg by nebulization every 6 (six) hours as needed for wheezing or shortness of breath.   alendronate 70 MG tablet Commonly known as:  FOSAMAX Take 70 mg by mouth every Wednesday. Take with a full glass of water on an empty stomach.   aspirin 81 MG EC tablet Commonly known as:  aspirin EC Take 1 tablet (81 mg total) by mouth daily. Swallow whole.   atorvastatin 40 MG tablet Commonly known as:  LIPITOR Take 40 mg by mouth. Monday, Wednesday, & Friday evening.   azithromycin 250 MG tablet Commonly known as:  ZITHROMAX Take 1 tablet (250 mg total) by mouth daily for 4 days.   calcium citrate-vitamin D 500-400 MG-UNIT chewable tablet Chew 1 tablet by mouth 2 (two) times daily.   digoxin 0.125 MG tablet Commonly known as:  LANOXIN Take 1 tablet (0.125 mg total) by mouth every evening.   diltiazem 30 MG tablet Commonly known as:  CARDIZEM Take 1 tablet (30 mg total) by mouth every 6 (six) hours.   docusate sodium 100 MG capsule Commonly known as:  COLACE Take 100 mg by mouth at bedtime.   fluticasone 50 MCG/ACT nasal spray Commonly known as:  FLONASE Place 2 sprays into both nostrils daily.   furosemide 20 MG tablet Commonly known as:   LASIX Take 10 mg by mouth 2 (two) times daily.   guaiFENesin 600 MG 12 hr tablet Commonly known as:  MUCINEX Take 2 tablets (1,200 mg total) by mouth 2 (two) times daily.   ICAPS Caps Take 1 capsule by mouth 2 (two) times daily.   ICY HOT 5 % Ptch Generic drug:  Menthol Apply 1 application topically daily. Apply to top of back   levETIRAcetam 250 MG tablet Commonly known as:  KEPPRA Take 125-250 mg by mouth See admin instructions. Takes one tablet in the morning and half a tablet at 3 pm.   levothyroxine 25 MCG tablet Commonly known as:  SYNTHROID, LEVOTHROID Take 25 mcg by mouth daily before breakfast.   midodrine 2.5 MG tablet Commonly known as:  PROAMATINE Take 1 tablet (2.5 mg total) by mouth 2 (two) times daily with a meal. The dosing can be titrated down to once daily when her systolic blood pressure is consistently in the 100s.   MIN-O-EAR Liqd Place 2 drops in ear(s) every Monday. At bedtime for stopped up ears   mometasone 0.1 % cream Commonly known as:  ELOCON Apply 1 application topically daily. To ears for eczema   nitroGLYCERIN 0.4 MG SL tablet Commonly known as:  NITROSTAT Place 0.4 mg under the tongue every 5 (five) minutes as needed for chest pain.   omeprazole 20 MG tablet Commonly known as:  PRILOSEC OTC Take 20 mg by mouth. Monday, Wednesday, & Friday evening.   potassium chloride 10 MEQ CR tablet Commonly known as:  KLOR-CON Take 10 mEq by mouth daily.   Pramox-PE-Glycerin-Petrolatum 1-0.25-14.4-15 % Crea Commonly known as:  PREPARATION H Use daily prn What changed:    how much to take  how to take this  when to take this  reasons to take this  additional instructions   predniSONE 20 MG tablet Commonly known as:  DELTASONE Take 2 tablets (40 mg total) by mouth daily with breakfast for 3 days. Start taking on:  06/24/2017   PROBIOTIC DAILY PO Take 1 tablet by mouth every morning.          Relevant Imaging  Results:  Relevant Lab Results:   Additional Information SSN: [id number]  Vega Stare, Juleen China, LCSW

## 2017-06-30 ENCOUNTER — Other Ambulatory Visit (HOSPITAL_COMMUNITY)
Admission: RE | Admit: 2017-06-30 | Discharge: 2017-06-30 | Disposition: A | Discharge status: Home / Self Care | Payer: Medicare Other | Source: Skilled Nursing Facility

## 2017-06-30 DIAGNOSIS — Z029 Encounter for administrative examinations, unspecified: Secondary | ICD-10-CM | POA: Insufficient documentation

## 2017-06-30 LAB — CBC
HCT: 46.6 % — ABNORMAL HIGH (ref 36.0–46.0)
Hemoglobin: 14.6 g/dL (ref 12.0–15.0)
MCH: 28.2 pg (ref 26.0–34.0)
MCHC: 31.3 g/dL (ref 30.0–36.0)
MCV: 90.1 fL (ref 78.0–100.0)
PLATELETS: 241 10*3/uL (ref 150–400)
RBC: 5.17 MIL/uL — AB (ref 3.87–5.11)
RDW: 15.2 % (ref 11.5–15.5)
WBC: 9.5 10*3/uL (ref 4.0–10.5)

## 2017-06-30 LAB — BASIC METABOLIC PANEL
ANION GAP: 13 (ref 5–15)
BUN: 18 mg/dL (ref 6–20)
CO2: 26 mmol/L (ref 22–32)
Calcium: 9.7 mg/dL (ref 8.9–10.3)
Chloride: 98 mmol/L — ABNORMAL LOW (ref 101–111)
Creatinine, Ser: 0.65 mg/dL (ref 0.44–1.00)
GFR calc Af Amer: >60 mL/min (ref >60)
Glucose, Bld: 87 mg/dL (ref 65–99)
POTASSIUM: 4.6 mmol/L (ref 3.5–5.1)
SODIUM: 137 mmol/L (ref 135–145)

## 2017-09-02 ENCOUNTER — Encounter (HOSPITAL_COMMUNITY): Payer: Self-pay | Admitting: Emergency Medicine

## 2017-09-02 ENCOUNTER — Emergency Department (HOSPITAL_COMMUNITY): Payer: Medicare Other

## 2017-09-02 ENCOUNTER — Observation Stay (HOSPITAL_COMMUNITY)
Admission: EM | Admit: 2017-09-02 | Discharge: 2017-09-03 | Disposition: A | Discharge status: Home / Self Care | Payer: Medicare Other | Attending: Internal Medicine | Admitting: Internal Medicine

## 2017-09-02 ENCOUNTER — Other Ambulatory Visit: Payer: Self-pay

## 2017-09-02 ENCOUNTER — Observation Stay (HOSPITAL_BASED_OUTPATIENT_CLINIC_OR_DEPARTMENT_OTHER): Payer: Medicare Other

## 2017-09-02 DIAGNOSIS — I5032 Chronic diastolic (congestive) heart failure: Secondary | ICD-10-CM | POA: Insufficient documentation

## 2017-09-02 DIAGNOSIS — Z79899 Other long term (current) drug therapy: Secondary | ICD-10-CM | POA: Diagnosis not present

## 2017-09-02 DIAGNOSIS — G308 Other Alzheimer's disease: Secondary | ICD-10-CM | POA: Diagnosis not present

## 2017-09-02 DIAGNOSIS — Z87891 Personal history of nicotine dependence: Secondary | ICD-10-CM | POA: Insufficient documentation

## 2017-09-02 DIAGNOSIS — Z7982 Long term (current) use of aspirin: Secondary | ICD-10-CM | POA: Insufficient documentation

## 2017-09-02 DIAGNOSIS — F0281 Dementia in other diseases classified elsewhere with behavioral disturbance: Secondary | ICD-10-CM | POA: Diagnosis not present

## 2017-09-02 DIAGNOSIS — I110 Hypertensive heart disease with heart failure: Secondary | ICD-10-CM | POA: Diagnosis not present

## 2017-09-02 DIAGNOSIS — I361 Nonrheumatic tricuspid (valve) insufficiency: Secondary | ICD-10-CM | POA: Diagnosis not present

## 2017-09-02 DIAGNOSIS — R079 Chest pain, unspecified: Secondary | ICD-10-CM | POA: Diagnosis present

## 2017-09-02 DIAGNOSIS — I4891 Unspecified atrial fibrillation: Secondary | ICD-10-CM | POA: Diagnosis not present

## 2017-09-02 DIAGNOSIS — F0280 Dementia in other diseases classified elsewhere without behavioral disturbance: Secondary | ICD-10-CM | POA: Diagnosis present

## 2017-09-02 DIAGNOSIS — G309 Alzheimer's disease, unspecified: Secondary | ICD-10-CM | POA: Insufficient documentation

## 2017-09-02 DIAGNOSIS — J449 Chronic obstructive pulmonary disease, unspecified: Secondary | ICD-10-CM | POA: Diagnosis not present

## 2017-09-02 LAB — ECHOCARDIOGRAM COMPLETE
Height: 60 in
Weight: 2208.13 oz

## 2017-09-02 LAB — BASIC METABOLIC PANEL
Anion gap: 12 (ref 5–15)
BUN: 15 mg/dL (ref 6–20)
CHLORIDE: 101 mmol/L (ref 101–111)
CO2: 26 mmol/L (ref 22–32)
CREATININE: 0.58 mg/dL (ref 0.44–1.00)
Calcium: 9.4 mg/dL (ref 8.9–10.3)
GFR calc non Af Amer: >60 mL/min (ref >60)
GLUCOSE: 93 mg/dL (ref 65–99)
Potassium: 4.1 mmol/L (ref 3.5–5.1)
Sodium: 139 mmol/L (ref 135–145)

## 2017-09-02 LAB — CBC WITH DIFFERENTIAL/PLATELET
Basophils Absolute: 0 10*3/uL (ref 0.0–0.1)
Basophils Relative: 0 %
EOS ABS: 0.1 10*3/uL (ref 0.0–0.7)
Eosinophils Relative: 1 %
HCT: 43.4 % (ref 36.0–46.0)
Hemoglobin: 13.6 g/dL (ref 12.0–15.0)
LYMPHS ABS: 0.7 10*3/uL (ref 0.7–4.0)
LYMPHS PCT: 14 %
MCH: 29.1 pg (ref 26.0–34.0)
MCHC: 31.3 g/dL (ref 30.0–36.0)
MCV: 92.9 fL (ref 78.0–100.0)
MONO ABS: 0.6 10*3/uL (ref 0.1–1.0)
MONOS PCT: 11 %
Neutro Abs: 3.8 10*3/uL (ref 1.7–7.7)
Neutrophils Relative %: 74 %
Platelets: 160 10*3/uL (ref 150–400)
RBC: 4.67 MIL/uL (ref 3.87–5.11)
RDW: 16.1 % — ABNORMAL HIGH (ref 11.5–15.5)
WBC: 5.1 10*3/uL (ref 4.0–10.5)

## 2017-09-02 LAB — DIGOXIN LEVEL: DIGOXIN LVL: 1.1 ng/mL (ref 0.8–2.0)

## 2017-09-02 LAB — TROPONIN I
Troponin I: <0.03 ng/mL (ref <0.03)
Troponin I: <0.03 ng/mL (ref <0.03)

## 2017-09-02 LAB — PROTIME-INR
INR: 0.92
Prothrombin Time: 12.3 seconds (ref 11.4–15.2)

## 2017-09-02 LAB — BRAIN NATRIURETIC PEPTIDE: B Natriuretic Peptide: 72 pg/mL (ref 0.0–100.0)

## 2017-09-02 MED ORDER — PNEUMOCOCCAL VAC POLYVALENT 25 MCG/0.5ML IJ INJ: 0.5000 mL | INJECTION | INTRAMUSCULAR | Status: DC

## 2017-09-02 MED ORDER — LEVETIRACETAM 250 MG PO TABS
125.0000 mg | ORAL_TABLET | Freq: Every day | ORAL | Status: DC
  Administered 2017-09-02: 125 mg via ORAL
  Filled 2017-09-02: qty 1

## 2017-09-02 MED ORDER — DIGOXIN 125 MCG PO TABS: 0.1250 mg | ORAL_TABLET | Freq: Every evening | ORAL | Status: DC

## 2017-09-02 MED ORDER — ALBUTEROL SULFATE (2.5 MG/3ML) 0.083% IN NEBU
2.5000 mg | INHALATION_SOLUTION | Freq: Four times a day (QID) | RESPIRATORY_TRACT | Status: DC | PRN
  Administered 2017-09-02: 2.5 mg via RESPIRATORY_TRACT
  Filled 2017-09-02: qty 3

## 2017-09-02 MED ORDER — FLUTICASONE PROPIONATE 50 MCG/ACT NA SUSP
2.0000 | Freq: Every day | NASAL | Status: DC
  Administered 2017-09-02 – 2017-09-03 (×2): 2 via NASAL
  Filled 2017-09-02: qty 16

## 2017-09-02 MED ORDER — MIDODRINE HCL 5 MG PO TABS
2.5000 mg | ORAL_TABLET | Freq: Two times a day (BID) | ORAL | Status: DC
  Administered 2017-09-02 – 2017-09-03 (×2): 2.5 mg via ORAL
  Filled 2017-09-02 (×2): qty 1

## 2017-09-02 MED ORDER — ASPIRIN 81 MG PO CHEW
324.0000 mg | CHEWABLE_TABLET | Freq: Once | ORAL | Status: AC
  Administered 2017-09-02: 324 mg via ORAL
  Filled 2017-09-02: qty 4

## 2017-09-02 MED ORDER — ALBUTEROL SULFATE (2.5 MG/3ML) 0.083% IN NEBU
2.5000 mg | INHALATION_SOLUTION | Freq: Three times a day (TID) | RESPIRATORY_TRACT | Status: DC
  Administered 2017-09-02 – 2017-09-03 (×2): 2.5 mg via RESPIRATORY_TRACT
  Filled 2017-09-02 (×2): qty 3

## 2017-09-02 MED ORDER — ACETAMINOPHEN 500 MG PO TABS: 500.0000 mg | ORAL_TABLET | Freq: Four times a day (QID) | ORAL | Status: DC | PRN

## 2017-09-02 MED ORDER — ATORVASTATIN CALCIUM 40 MG PO TABS: 40.0000 mg | ORAL_TABLET | ORAL | Status: DC

## 2017-09-02 MED ORDER — LEVOTHYROXINE SODIUM 25 MCG PO TABS
25.0000 ug | ORAL_TABLET | Freq: Every day | ORAL | Status: DC
  Administered 2017-09-02 – 2017-09-03 (×2): 25 ug via ORAL
  Filled 2017-09-02 (×2): qty 1

## 2017-09-02 MED ORDER — MENTHOL (TOPICAL ANALGESIC) 5 % EX PTCH: 1.0000 "application " | MEDICATED_PATCH | Freq: Every day | CUTANEOUS | Status: DC

## 2017-09-02 MED ORDER — DOCUSATE SODIUM 100 MG PO CAPS
100.0000 mg | ORAL_CAPSULE | Freq: Every day | ORAL | Status: DC
  Administered 2017-09-02: 100 mg via ORAL
  Filled 2017-09-02: qty 1

## 2017-09-02 MED ORDER — CALCIUM CARBONATE-VITAMIN D 500-200 MG-UNIT PO TABS
1.0000 | ORAL_TABLET | Freq: Two times a day (BID) | ORAL | Status: DC
  Administered 2017-09-02 – 2017-09-03 (×2): 1 via ORAL
  Filled 2017-09-02 (×2): qty 1

## 2017-09-02 MED ORDER — PANTOPRAZOLE SODIUM 40 MG PO TBEC
40.0000 mg | DELAYED_RELEASE_TABLET | Freq: Every day | ORAL | Status: DC
  Administered 2017-09-02 – 2017-09-03 (×2): 40 mg via ORAL
  Filled 2017-09-02 (×2): qty 1

## 2017-09-02 MED ORDER — DILTIAZEM HCL 30 MG PO TABS
30.0000 mg | ORAL_TABLET | Freq: Four times a day (QID) | ORAL | Status: DC
  Administered 2017-09-02 – 2017-09-03 (×3): 30 mg via ORAL
  Filled 2017-09-02 (×4): qty 1

## 2017-09-02 MED ORDER — OMEPRAZOLE MAGNESIUM 20 MG PO TBEC: 20.0000 mg | DELAYED_RELEASE_TABLET | ORAL | Status: DC

## 2017-09-02 MED ORDER — CALCIUM CITRATE-VITAMIN D 500-400 MG-UNIT PO CHEW: 1.0000 | CHEWABLE_TABLET | Freq: Two times a day (BID) | ORAL | Status: DC

## 2017-09-02 MED ORDER — ASPIRIN EC 81 MG PO TBEC: 81.0000 mg | DELAYED_RELEASE_TABLET | Freq: Every day | ORAL | Status: DC

## 2017-09-02 MED ORDER — MUSCLE RUB 10-15 % EX CREA
TOPICAL_CREAM | CUTANEOUS | Status: DC | PRN
  Filled 2017-09-02: qty 85

## 2017-09-02 MED ORDER — LEVETIRACETAM 250 MG PO TABS
250.0000 mg | ORAL_TABLET | Freq: Every morning | ORAL | Status: DC
  Administered 2017-09-03: 250 mg via ORAL
  Filled 2017-09-02: qty 1

## 2017-09-02 MED ORDER — POTASSIUM CHLORIDE CRYS ER 10 MEQ PO TBCR
10.0000 meq | EXTENDED_RELEASE_TABLET | Freq: Every day | ORAL | Status: DC
  Administered 2017-09-02 – 2017-09-03 (×2): 10 meq via ORAL
  Filled 2017-09-02 (×2): qty 1

## 2017-09-02 MED ORDER — FUROSEMIDE 20 MG PO TABS
10.0000 mg | ORAL_TABLET | Freq: Two times a day (BID) | ORAL | Status: DC
  Administered 2017-09-02 – 2017-09-03 (×2): 10 mg via ORAL
  Filled 2017-09-02 (×2): qty 1

## 2017-09-02 MED ORDER — MIN-O-EAR OT LIQD: 2.0000 [drp] | OTIC | Status: DC

## 2017-09-02 MED ORDER — SACCHAROMYCES BOULARDII 250 MG PO CAPS
ORAL_CAPSULE | Freq: Every morning | ORAL | Status: DC
  Filled 2017-09-02: qty 1

## 2017-09-02 NOTE — ED Provider Notes (Signed)
[facility] EMERGENCY DEPARTMENT Provider Note   CSN: [id number] Arrival date & time: 09/02/17  0734     History   Chief Complaint Chief Complaint  Patient presents with  . Chest Pain    HPI Deborah [name] is a 84 y.o. female.  The history is provided by the patient, the EMS personnel, the nursing home and a relative. The history is limited by the condition of the patient (Hx dementia).  Chest Pain      Pt was seen at 0810.  Per EMS, NH report, pt's family and pt: Pt states she woke up from sleep with mid-sternal and left sided chest "pain" approximately 0430 this morning. Pt was given SL ntg with improvement of her CP.  Pt was also given a neb treatment. It is unclear if pt had c/o SOB. Pt has significant hx of dementia; currently denies any complaints. Pt's daughter states pt has "never taken ntg" and requested transport to the ED for evaluation.   Past Medical History:  Diagnosis Date  . Alzheimer disease   . Asthma   . Atrial fibrillation (HCC)    Permanent  . Chronic anticoagulation    Xarelto discontinued after fall  . Chronic diastolic heart failure (HCC)   . COPD (chronic obstructive pulmonary disease) (HCC)   . Coronary atherosclerosis of native coronary artery    BMS circumflex 2009  . Dementia without behavioral disturbance   . Drug intolerance    Flecacinide and Amiodarone   . Fracture of multiple pubic rami (HCC) 12/13   Left superior and inferior - following fall  . Hypertension   . Hypotension   . Mixed hyperlipidemia   . Osteoarthritis   . Retroperitoneal hemorrhage 12/13   Following fall  . Seizures (HCC)    Remote history of over 20 years ago  . Umbilical hernia     Patient Active Problem List   Diagnosis Date Noted  . Compression fracture of body of thoracic vertebra (HCC) 06/22/2017  . Atrial fibrillation with rapid ventricular response (HCC)   . Screening for colorectal cancer 03/24/2017  . Hemorrhoids 03/24/2017  . Itching in the vaginal  area 03/24/2017  . Vulval thrush 03/24/2017  . Hypoxemia   . COPD with acute exacerbation (HCC) 09/13/2016  . Alzheimer disease 09/13/2016  . Seizures (HCC) 09/13/2016  . Hypokalemia 09/13/2016  . Atrial fibrillation with RVR (HCC) 09/13/2016  . COPD exacerbation (HCC) 09/13/2016  . Hypotension 04/22/2012  . Coronary atherosclerosis of native coronary artery   . COPD (chronic obstructive pulmonary disease) (HCC)   . Mixed hyperlipidemia 05/14/2009  . Permanent atrial fibrillation (HCC) 02/10/2009  . DIASTOLIC HEART FAILURE, CHRONIC 02/10/2009    Past Surgical History:  Procedure Laterality Date  . CATARACT EXTRACTION    . Radiofrequency catheter ablation     Failed  . Right carpel tunnel release    . TONSILLECTOMY       OB History    Gravida  1   Para  1   Term  1   Preterm      AB      Living  1     SAB      TAB      Ectopic      Multiple      Live Births  1            Home Medications    Prior to Admission medications   Medication Sig Start Date End Date Taking? Authorizing Provider  acetaminophen (  TYLENOL) 500 MG tablet Take 500 mg by mouth every 6 (six) hours as needed for mild pain. For pain     [provider]  albuterol (PROVENTIL HFA;VENTOLIN HFA) 108 (90 Base) MCG/ACT inhaler Inhale 2 puffs into the lungs every 6 (six) hours as needed for wheezing or shortness of breath.    [provider]  albuterol (PROVENTIL) (2.5 MG/3ML) 0.083% nebulizer solution Take 2.5 mg by nebulization every 6 (six) hours as needed for wheezing or shortness of breath.     [provider]  alendronate (FOSAMAX) 70 MG tablet Take 70 mg by mouth every Wednesday. Take with a full glass of water on an empty stomach.     [provider]  aspirin (ASPIRIN EC) 81 MG EC tablet Take 1 tablet (81 mg total) by mouth daily. Swallow whole. 05/04/12   [name], MD  atorvastatin (LIPITOR) 40 MG tablet Take 40 mg by mouth. Monday, Wednesday, &  Friday evening.    [provider]  calcium citrate-vitamin D 500-400 MG-UNIT chewable tablet Chew 1 tablet by mouth 2 (two) times daily.    [provider]  digoxin (LANOXIN) 0.125 MG tablet Take 1 tablet (0.125 mg total) by mouth every evening. 09/21/16   [name], [name], MD  diltiazem (CARDIZEM) 30 MG tablet Take 1 tablet (30 mg total) by mouth every 6 (six) hours. 09/21/16   [name], [name], MD  docusate sodium (COLACE) 100 MG capsule Take 100 mg by mouth at bedtime.    [provider]  fluticasone (FLONASE) 50 MCG/ACT nasal spray Place 2 sprays into both nostrils daily.    [provider]  furosemide (LASIX) 20 MG tablet Take 10 mg by mouth 2 (two) times daily.     [provider]  guaiFENesin (MUCINEX) 600 MG 12 hr tablet Take 2 tablets (1,200 mg total) by mouth 2 (two) times daily. 09/21/16   [name], [name], MD  levETIRAcetam (KEPPRA) 250 MG tablet Take 125-250 mg by mouth See admin instructions. Takes one tablet in the morning and half a tablet at 3 pm.    [provider]  levothyroxine (SYNTHROID, LEVOTHROID) 25 MCG tablet Take 25 mcg by mouth daily before breakfast.    [provider]  Menthol (ICY HOT) 5 % PTCH Apply 1 application topically daily. Apply to top of back    [provider]  Menthol-Methyl Salicylate (MUSCLE RUB) 10-15 % CREA Apply 1 application topically as needed for muscle pain. 06/23/17   Johnson, Clanford L, MD  midodrine (PROAMATINE) 2.5 MG tablet Take 1 tablet (2.5 mg total) by mouth 2 (two) times daily with a meal. The dosing can be titrated down to once daily when her systolic blood pressure is consistently in the 100s. 05/04/12   [name], MD  Mineral Oil-Oleth-Polysorbate (MIN-O-EAR) LIQD Place 2 drops in ear(s) every Monday. At bedtime for stopped up ears    [provider]  mometasone (ELOCON) 0.1 % cream Apply 1 application topically daily. To ears for  eczema    [provider]  Multiple Vitamins-Minerals (ICAPS) CAPS Take 1 capsule by mouth 2 (two) times daily.    [provider]  nitroGLYCERIN (NITROSTAT) 0.4 MG SL tablet Place 0.4 mg under the tongue every 5 (five) minutes as needed for chest pain.    [provider]  omeprazole (PRILOSEC OTC) 20 MG tablet Take 20 mg by mouth. Monday, Wednesday, & Friday evening.    [provider]  potassium chloride (KLOR-CON) 10 MEQ CR tablet Take 10 mEq by mouth daily.      [provider]  Pramox-PE-Glycerin-Petrolatum (PREPARATION H) 1-0.25-14.4-15 % CREA Use daily prn Patient taking differently: Place 1 application rectally daily as needed (for hemorrhoids). Use daily prn 03/24/17   Adline Potter, NP  Probiotic Product (PROBIOTIC DAILY PO) Take 1 tablet by mouth every morning.    [provider]    Family History Family History  Problem Relation Age of Onset  . Coronary artery disease Mother   . Tuberculosis Paternal Grandfather   . Heart attack Maternal Grandmother     Social History Social History   Tobacco Use  . Smoking status: Former Smoker    Packs/day: 0.80    Years: 25.00    Pack years: 20.00    Types: Cigarettes    Last attempt to quit: 05/23/1990    Years since quitting: 27.2  . Smokeless tobacco: Never Used  Substance Use Topics  . Alcohol use: No    Alcohol/week: 0.0 oz  . Drug use: No     Allergies   Ivp dye [iodinated diagnostic agents]; Omnipaque [iohexol]; Sulfa antibiotics; Carbamazepine; Dilaudid [hydromorphone]; and Tramadol   Review of Systems Review of Systems  Unable to perform ROS: Dementia  Cardiovascular: Positive for chest pain.     Physical Exam Updated Vital Signs BP (!) 117/92   Pulse 97   Temp 97.8 F (36.6 C) (Oral)   Resp 19   Ht 5' (1.524 m)   Wt 59.9 kg (132 lb)   SpO2 96%   BMI 25.78 kg/m   Physical Exam 0815: Physical examination:  Nursing notes reviewed; Vital signs  and O2 SAT reviewed;  Constitutional: Well developed, Well nourished, Well hydrated, In no acute distress; Head:  Normocephalic, atraumatic; Eyes: EOMI, PERRL, No scleral icterus; ENMT: Mouth and pharynx normal, Mucous membranes moist; Neck: Supple, Full range of motion, No lymphadenopathy; Cardiovascular: Irregular rate and rhythm, No gallop; Respiratory: Breath sounds clear & equal bilaterally, No wheezes.  Speaking full sentences with ease, Normal respiratory effort/excursion; Chest: Nontender, birthmark present left chest wall. Movement normal; Abdomen: Soft, Nontender, Nondistended, Normal bowel sounds; Genitourinary: No CVA tenderness; Extremities: Peripheral pulses normal, No tenderness, No edema, No calf edema or asymmetry.; Neuro: Awake, alert, confused per hx dementia. Major CN grossly intact.  Speech clear. No gross focal motor or sensory deficits in extremities.; Skin: Color normal, Warm, Dry.   ED Treatments / Results  Labs (all labs ordered are listed, but only abnormal results are displayed)   EKG EKG Interpretation  Date/Time:  Saturday September 02 2017 07:50:00 EDT Ventricular Rate:  113 PR Interval:    QRS Duration: 88 QT Interval:  300 QTC Calculation: 412 R Axis:   67 Text Interpretation:  Atrial fibrillation Repol abnrm suggests ischemia, anterolateral Baseline wander When compared with ECG of 06/21/2017 No significant change was found Confirmed by Samuel Jester [phone]) on 09/02/2017 8:10:35 AM   Radiology   Procedures Procedures (including critical care time)  Medications Ordered in ED Medications  aspirin chewable tablet 324 mg (has no administration in time range)     Initial Impression / Assessment and Plan / ED Course  I have reviewed the triage vital signs and the nursing notes.  Pertinent labs & imaging results that were available during my care of the patient were reviewed by me and considered in my medical decision making (see chart for  details).  MDM Reviewed: previous chart, nursing note and vitals  Reviewed previous: labs and ECG Interpretation: labs, ECG and x-ray    Results for orders placed or performed during the hospital encounter of 09/02/17  Basic metabolic panel  Result Value Ref Range   Sodium 139 135 - 145 mmol/L   Potassium 4.1 3.5 - 5.1 mmol/L   Chloride 101 101 - 111 mmol/L   CO2 26 22 - 32 mmol/L   Glucose, Bld 93 65 - 99 mg/dL   BUN 15 6 - 20 mg/dL   Creatinine, Ser 1.610.58 0.44 - 1.00 mg/dL   Calcium 9.4 8.9 - 09.610.3 mg/dL   GFR calc non Af Amer >60 >60 mL/min   GFR calc Af Amer >60 >60 mL/min   Anion gap 12 5 - 15  Troponin I  Result Value Ref Range   Troponin I <0.03 <0.03 ng/mL  CBC with Differential  Result Value Ref Range   WBC 5.1 4.0 - 10.5 K/uL   RBC 4.67 3.87 - 5.11 MIL/uL   Hemoglobin 13.6 12.0 - 15.0 g/dL   HCT 04.543.4 40.936.0 - 81.146.0 %   MCV 92.9 78.0 - 100.0 fL   MCH 29.1 26.0 - 34.0 pg   MCHC 31.3 30.0 - 36.0 g/dL   RDW 91.416.1 (H) 78.211.5 - 95.615.5 %   Platelets 160 150 - 400 K/uL   Neutrophils Relative % 74 %   Neutro Abs 3.8 1.7 - 7.7 K/uL   Lymphocytes Relative 14 %   Lymphs Abs 0.7 0.7 - 4.0 K/uL   Monocytes Relative 11 %   Monocytes Absolute 0.6 0.1 - 1.0 K/uL   Eosinophils Relative 1 %   Eosinophils Absolute 0.1 0.0 - 0.7 K/uL   Basophils Relative 0 %   Basophils Absolute 0.0 0.0 - 0.1 K/uL  Brain natriuretic peptide  Result Value Ref Range   B Natriuretic Peptide 72.0 0.0 - 100.0 pg/mL  Protime-INR  Result Value Ref Range   Prothrombin Time 12.3 11.4 - 15.2 seconds   INR 0.92   Digoxin level  Result Value Ref Range   Digoxin Level 1.1 0.8 - 2.0 ng/mL   Dg Chest 2 View Result Date: 09/02/2017 CLINICAL DATA:  84 year old female with chest pain. Recent pneumonia. History of thoracic compression fractures due to coughing. EXAM: CHEST - 2 VIEW COMPARISON:  Thoracic spine radiographs 06/21/2017 and earlier. FINDINGS: PA and lateral views of the chest. Chronic somewhat large  lung volumes with increased AP dimension to the chest. Stable cardiomegaly and mediastinal contours. Visualized tracheal air column is within normal limits. No pneumothorax, pulmonary edema, pleural effusion or confluent pulmonary opacity. Osteopenia with multilevel thoracic compression fractures which appear stable since January. Abdomen Calcified aortic atherosclerosis. Paucity of upper abdominal bowel gas. IMPRESSION: Stable.  No acute cardiopulmonary abnormality. Electronically Signed   By: Odessa FlemingH  Hall M.D.   On: 09/02/2017 08:37    0950:  Initial workup reassuring. Denies CP or SOB while in the ED. Heart score 5. Long d/w pt's daughter and pt regarding goals of care, what interventions would they want if there was an elevated troponin (ie: cath, CABG), as well as overnight admit vs 2nd ED troponin. Pt's daughter would prefer observation admit.  T/C returned from [facility] York SpanielBuriev, case discussed, including:  HPI, pertinent PM/SHx, VS/PE, dx testing, ED course and treatment:  Agreeable to admit.    Final Clinical Impressions(s) / ED Diagnoses   Final diagnoses:  None    ED Discharge Orders    None       Samuel JesterMcManus, Khamani Daniely, DO  09/03/17 0835  

## 2017-09-02 NOTE — H&P (Signed)
[facility] History and Physical  Deborah [name] WUJ:[id number] DOB: 1925/11/17 DOA: 09/02/2017  Referring physician:  PCP: Marlowe Aschoff, FNP  Specialists:   Chief Complaint: chest pain   HPI: Deborah [name] is a 84 y.o. female with PMH of A fib (not on anticoagulation due to bleeding), COPD, Chronic Hypotension, Dementia, presented with chest pain. Patient states that she woke up with substernal lower chest pain around 4.30. Patient was non radiating and lasted few seconds. She was given NTG at [facility]. Patient called her daughter at 07.00 who requested to transport to ED. Currently, she denies acute pains, no dyspnea, no gi symptoms. Initial trop negative. hospitalist is called for observation for work up   Review of Systems: The patient denies anorexia, fever, weight loss,, vision loss, decreased hearing, hoarseness, chest pain, syncope, dyspnea on exertion, peripheral edema, balance deficits, hemoptysis, abdominal pain, melena, hematochezia, severe indigestion/heartburn, hematuria, incontinence, genital sores, muscle weakness, suspicious skin lesions, transient blindness, difficulty walking, depression, unusual weight change, abnormal bleeding, enlarged lymph nodes, angioedema, and breast masses.    Past Medical History:  Diagnosis Date  . Alzheimer disease   . Asthma   . Atrial fibrillation (HCC)    Permanent  . Chronic anticoagulation    Xarelto discontinued after fall  . Chronic diastolic heart failure (HCC)   . COPD (chronic obstructive pulmonary disease) (HCC)   . Coronary atherosclerosis of native coronary artery    BMS circumflex 2009  . Dementia without behavioral disturbance   . Drug intolerance    Flecacinide and Amiodarone   . Fracture of multiple pubic rami (HCC) 12/13   Left superior and inferior - following fall  . Hypertension   . Hypotension   . Mixed hyperlipidemia   . Osteoarthritis   . Retroperitoneal hemorrhage 12/13   Following fall  . Seizures (HCC)     Remote history of over 20 years ago  . Umbilical hernia    Past Surgical History:  Procedure Laterality Date  . CATARACT EXTRACTION    . Radiofrequency catheter ablation     Failed  . Right carpel tunnel release    . TONSILLECTOMY     Social History:  reports that she quit smoking about 27 years ago. Her smoking use included cigarettes. She has a 20.00 pack-year smoking history. She has never used smokeless tobacco. She reports that she does not drink alcohol or use drugs. NH;  where does patient live--home, ALF, SNF? and with whom if at home? Yes;  Can patient participate in ADLs?  Allergies  Allergen Reactions  . Ivp Dye [Iodinated Diagnostic Agents] Shortness Of Breath    ??  . Omnipaque [Iohexol] Shortness Of Breath and Other (See Comments)    Short of breath with chest tightness after IV injection in CT, pt was fine when she left department but developed symptoms in parking lot and went to emergency department.  . Sulfa Antibiotics Shortness Of Breath  . Carbamazepine     REACTION: toxemia  . Dilaudid [Hydromorphone] Nausea And Vomiting  . Tramadol Other (See Comments)    confusion    Family History  Problem Relation Age of Onset  . Coronary artery disease Mother   . Tuberculosis Paternal Grandfather   . Heart attack Maternal Grandmother     (be sure to complete)  Prior to Admission medications   Medication Sig Start Date End Date Taking? Authorizing Provider  acetaminophen (TYLENOL) 500 MG tablet Take 500 mg by mouth every 6 (six) hours as needed for  mild pain. For pain     [provider]  albuterol (PROVENTIL HFA;VENTOLIN HFA) 108 (90 Base) MCG/ACT inhaler Inhale 2 puffs into the lungs every 6 (six) hours as needed for wheezing or shortness of breath.    [provider]  albuterol (PROVENTIL) (2.5 MG/3ML) 0.083% nebulizer solution Take 2.5 mg by nebulization every 6 (six) hours as needed for wheezing or shortness of breath.     [provider]  alendronate (FOSAMAX) 70 MG tablet Take 70 mg by mouth every Wednesday. Take with a full glass of water on an empty stomach.     [provider]  aspirin (ASPIRIN EC) 81 MG EC tablet Take 1 tablet (81 mg total) by mouth daily. Swallow whole. 05/04/12   [name], MD  atorvastatin (LIPITOR) 40 MG tablet Take 40 mg by mouth. Monday, Wednesday, & Friday evening.    [provider]  calcium citrate-vitamin D 500-400 MG-UNIT chewable tablet Chew 1 tablet by mouth 2 (two) times daily.    [provider]  digoxin (LANOXIN) 0.125 MG tablet Take 1 tablet (0.125 mg total) by mouth every evening. 09/21/16   [name], [name], MD  diltiazem (CARDIZEM) 30 MG tablet Take 1 tablet (30 mg total) by mouth every 6 (six) hours. 09/21/16   [name], [name], MD  docusate sodium (COLACE) 100 MG capsule Take 100 mg by mouth at bedtime.    [provider]  fluticasone (FLONASE) 50 MCG/ACT nasal spray Place 2 sprays into both nostrils daily.    [provider]  furosemide (LASIX) 20 MG tablet Take 10 mg by mouth 2 (two) times daily.     [provider]  guaiFENesin (MUCINEX) 600 MG 12 hr tablet Take 2 tablets (1,200 mg total) by mouth 2 (two) times daily. 09/21/16   [name], [name], MD  levETIRAcetam (KEPPRA) 250 MG tablet Take 125-250 mg by mouth See admin instructions. Takes one tablet in the morning and half a tablet at 3 pm.    [provider]  levothyroxine (SYNTHROID, LEVOTHROID) 25 MCG tablet Take 25 mcg by mouth daily before breakfast.    [provider]  Menthol (ICY HOT) 5 % PTCH Apply 1 application topically daily. Apply to top of back    [provider]  Menthol-Methyl Salicylate (MUSCLE RUB) 10-15 % CREA Apply 1 application topically as needed for muscle pain. 06/23/17   Johnson, Clanford L, MD  midodrine (PROAMATINE) 2.5 MG tablet Take 1 tablet (2.5 mg total) by mouth 2 (two) times daily  with a meal. The dosing can be titrated down to once daily when her systolic blood pressure is consistently in the 100s. 05/04/12   [name], MD  Mineral Oil-Oleth-Polysorbate (MIN-O-EAR) LIQD Place 2 drops in ear(s) every Monday. At bedtime for stopped up ears    [provider]  mometasone (ELOCON) 0.1 % cream Apply 1 application topically daily. To ears for eczema    [provider]  Multiple Vitamins-Minerals (ICAPS) CAPS Take 1 capsule by mouth 2 (two) times daily.    [provider]  nitroGLYCERIN (NITROSTAT) 0.4 MG SL tablet Place 0.4 mg under the tongue every 5 (five) minutes as needed for chest pain.    [provider]  omeprazole (PRILOSEC OTC) 20 MG tablet Take 20 mg by mouth. Monday, Wednesday, & Friday evening.    [provider]  potassium chloride (KLOR-CON) 10 MEQ CR tablet Take 10 mEq by mouth daily.  [provider]  Pramox-PE-Glycerin-Petrolatum (PREPARATION H) 1-0.25-14.4-15 % CREA Use daily prn Patient taking differently: Place 1 application rectally daily as needed (for hemorrhoids). Use daily prn 03/24/17   Adline PotterGriffin, Jennifer A, NP  Probiotic Product (PROBIOTIC DAILY PO) Take 1 tablet by mouth every morning.    [provider]   Physical Exam: Vitals:   09/02/17 0900 09/02/17 0930  BP: 111/77 114/85  Pulse: (!) 44 (!) 45  Resp: (!) 22 (!) 24  Temp:    SpO2: 94% 99%     General:  No distress   Eyes: eom-I.   ENT: no oral ulcers   Neck: supple. No JVD  Cardiovascular: s1,s2 rrr  Respiratory: CTA BL  Abdomen: soft, nt, nd   Skin: birth mark in her neck   Musculoskeletal: no leg edema   Psychiatric: no hallucinations   Neurologic: CN 2-12 intact. Motor 5/5 BL   Labs on Admission:  Basic Metabolic Panel: Recent Labs  Lab 09/02/17 0806  NA 139  K 4.1  CL 101  CO2 26  GLUCOSE 93  BUN 15  CREATININE 0.58  CALCIUM 9.4   Liver Function Tests: No results for input(s): AST,  ALT, ALKPHOS, BILITOT, PROT, ALBUMIN in the last 168 hours. No results for input(s): LIPASE, AMYLASE in the last 168 hours. No results for input(s): AMMONIA in the last 168 hours. CBC: Recent Labs  Lab 09/02/17 0806  WBC 5.1  NEUTROABS 3.8  HGB 13.6  HCT 43.4  MCV 92.9  PLT 160   Cardiac Enzymes: Recent Labs  Lab 09/02/17 0806  TROPONINI <0.03    BNP (last 3 results) Recent Labs    12/04/16 1519 06/21/17 2113 09/02/17 0806  BNP 135.0* 47.0 72.0    ProBNP (last 3 results) No results for input(s): PROBNP in the last 8760 hours.  CBG: No results for input(s): GLUCAP in the last 168 hours.  Radiological Exams on Admission: Dg Chest 2 View  Result Date: 09/02/2017 CLINICAL DATA:  84 year old female with chest pain. Recent pneumonia. History of thoracic compression fractures due to coughing. EXAM: CHEST - 2 VIEW COMPARISON:  Thoracic spine radiographs 06/21/2017 and earlier. FINDINGS: PA and lateral views of the chest. Chronic somewhat large lung volumes with increased AP dimension to the chest. Stable cardiomegaly and mediastinal contours. Visualized tracheal air column is within normal limits. No pneumothorax, pulmonary edema, pleural effusion or confluent pulmonary opacity. Osteopenia with multilevel thoracic compression fractures which appear stable since January. Abdomen Calcified aortic atherosclerosis. Paucity of upper abdominal bowel gas. IMPRESSION: Stable.  No acute cardiopulmonary abnormality. Electronically Signed   By: Odessa FlemingH  Hall M.D.   On: 09/02/2017 08:37    EKG: Independently reviewed.   Assessment/Plan Active Problems:   COPD (chronic obstructive pulmonary disease) (HCC)   Alzheimer disease   Atrial fibrillation with rapid ventricular response (HCC)   Chest pain   84 y.o. female with PMH of A fib (not on anticoagulation due to bleeding), COPD, Chronic Hypotension, Dementia, seizure, presented with chest pain.  Chest pain. Atypical. Short lasting. No  further chest pains in ED. Initial trop: negative. Will obtain serial trop. Check echo. Cont home aspirin.   COPD. clinically stable. Cont prn bronchodilators Dementia. Stable  Sezures. On keppra  A fib. On dig, cardizem with holding parameters   None;  if consultant consulted, please document name and whether formally or informally consulted  Code Status: DNR (must indicate code status--if unknown or must be presumed, indicate so) Family Communication: d/w patient, her family  (  indicate person spoken with, if applicable, with phone number if by telephone) Disposition Plan: home 24-48 hrs  (indicate anticipated LOS)  Time spent: >45 minutes   Esperanza Sheets [facility] Pager [phone]  If 7PM-7AM, please contact night-coverage [email or web address] Password [facility] 09/02/2017, 10:22 AM

## 2017-09-02 NOTE — ED Triage Notes (Signed)
Pt brought here by daughter from [facility].  Pt called daughter at 0700.  Chest pain started at 0430 th is am.  Given NT only once which stopped chest pain.  And did receive breathing treatment. Denies any chest pain or SOB at this time.

## 2017-09-02 NOTE — Plan of Care (Signed)
progressing 

## 2017-09-02 NOTE — Progress Notes (Signed)
*  PRELIMINARY RESULTS* Echocardiogram 2D Echocardiogram has been performed.  Stacey DrainWhite, Arben Packman J 09/02/2017, 2:20 PM

## 2017-09-03 DIAGNOSIS — J449 Chronic obstructive pulmonary disease, unspecified: Secondary | ICD-10-CM | POA: Diagnosis not present

## 2017-09-03 DIAGNOSIS — F0281 Dementia in other diseases classified elsewhere with behavioral disturbance: Secondary | ICD-10-CM | POA: Diagnosis not present

## 2017-09-03 DIAGNOSIS — G308 Other Alzheimer's disease: Secondary | ICD-10-CM

## 2017-09-03 DIAGNOSIS — R079 Chest pain, unspecified: Secondary | ICD-10-CM

## 2017-09-03 DIAGNOSIS — I4891 Unspecified atrial fibrillation: Secondary | ICD-10-CM | POA: Diagnosis not present

## 2017-09-03 LAB — TROPONIN I: Troponin I: <0.03 ng/mL (ref <0.03)

## 2017-09-03 MED ORDER — ISOSORBIDE MONONITRATE ER 30 MG PO TB24: 15.0000 mg | ORAL_TABLET | Freq: Every day | ORAL | Status: DC

## 2017-09-03 MED ORDER — ISOSORBIDE MONONITRATE ER 30 MG PO TB24: 15.0000 mg | ORAL_TABLET | Freq: Every day | ORAL | 0 refills | Status: DC

## 2017-09-03 NOTE — Plan of Care (Signed)
Progressing

## 2017-09-03 NOTE — Discharge Summary (Signed)
Physician Discharge Summary  Deborah [name] JYN:[id number] DOB: 1925-08-04 DOA: 09/02/2017  PCP: Marlowe AschoffPolite, Latrice, FNP  Admit date: 09/02/2017 Discharge date: 09/03/2017  [facility] Disposition:  [facility]  Recommendations for Outpatient Follow-up:  1. Follow up with PCP in 1-2 weeks 2. Please obtain BMP/CBC in one week 3. Follow up with cardiology in 1-2 weeks  Home Health:  Equipment/Devices:  Discharge Condition: stable CODE STATUS: DNR Diet recommendation: Heart Healthy   Brief/Interim Summary: 84 year old female with a history of atrial fibrillation not on anticoagulation due to bleeding, COPD, chronic hypotension on midodrine, Alzheimer's dementia who was at his assisted living facility, presented to the hospital with chest pain.  Pain was very atypical, substernal lasting only a few seconds.  Nonradiating.  Improved with nitroglycerin.  No associated shortness of breath, nausea.  She did not have any recurrence of pain after her initial episode.  She was monitored in the hospital overnight on telemetry where she ruled out for ACS with negative cardiac markers and nonacute EKG.  She did not have any recurrence of symptoms.  She is able to ambulate without difficulty.  Echocardiogram performed which showed normal EF.  She is been started on low-dose Imdur and has been advised to follow-up with her primary cardiologist in the next 1-2 weeks.  No further inpatient workup planned.  She will be discharged back to assisted living facility today.  Remainder of her medical issues remained stable.  Discharge Diagnoses:  Active Problems:   COPD (chronic obstructive pulmonary disease) (HCC)   Alzheimer disease   Atrial fibrillation with rapid ventricular response (HCC)   Chest pain    Discharge Instructions  Discharge Instructions    Diet - low sodium heart healthy   Complete by:  As directed    Increase activity slowly   Complete by:  As directed      Allergies  as of 09/03/2017      Reactions   Ivp Dye [iodinated Diagnostic Agents] Shortness Of Breath   ??   Omnipaque [iohexol] Shortness Of Breath, Other (See Comments)   Short of breath with chest tightness after IV injection in CT, pt was fine when she left department but developed symptoms in parking lot and went to emergency department.   Sulfa Antibiotics Shortness Of Breath   Carbamazepine    REACTION: toxemia   Dilaudid [hydromorphone] Nausea And Vomiting   Tramadol Other (See Comments)   confusion      Medication List    TAKE these medications   acetaminophen 500 MG tablet Commonly known as:  TYLENOL Take 500 mg by mouth every 6 (six) hours as needed for mild pain. For pain   albuterol 108 (90 Base) MCG/ACT inhaler Commonly known as:  PROVENTIL HFA;VENTOLIN HFA Inhale 2 puffs into the lungs every 6 (six) hours as needed for wheezing or shortness of breath.   albuterol (2.5 MG/3ML) 0.083% nebulizer solution Commonly known as:  PROVENTIL Take 2.5 mg by nebulization every 6 (six) hours as needed for wheezing or shortness of breath.   alendronate 70 MG tablet Commonly known as:  FOSAMAX Take 70 mg by mouth every Wednesday. Take with a full glass of water on an empty stomach.   aspirin 81 MG EC tablet Commonly known as:  aspirin EC Take 1 tablet (81 mg total) by mouth daily. Swallow whole.   atorvastatin 40 MG tablet Commonly known as:  LIPITOR Take 40 mg by mouth. Monday, Wednesday, & Friday evening.   calcium citrate-vitamin D  500-400 MG-UNIT chewable tablet Chew 1 tablet by mouth 2 (two) times daily.   digoxin 0.125 MG tablet Commonly known as:  LANOXIN Take 1 tablet (0.125 mg total) by mouth every evening.   diltiazem 30 MG tablet Commonly known as:  CARDIZEM Take 1 tablet (30 mg total) by mouth every 6 (six) hours.   docusate sodium 100 MG capsule Commonly known as:  COLACE Take 100 mg by mouth at bedtime.   fluconazole 100 MG tablet Commonly known as:   DIFLUCAN Take 100 mg by mouth daily.   fluticasone 50 MCG/ACT nasal spray Commonly known as:  FLONASE Place 2 sprays into both nostrils daily.   furosemide 20 MG tablet Commonly known as:  LASIX Take 10 mg by mouth 2 (two) times daily.   guaiFENesin 600 MG 12 hr tablet Commonly known as:  MUCINEX Take 2 tablets (1,200 mg total) by mouth 2 (two) times daily.   ICAPS Caps Take 1 capsule by mouth 2 (two) times daily.   isosorbide mononitrate 30 MG 24 hr tablet Commonly known as:  IMDUR Take 0.5 tablets (15 mg total) by mouth daily.   levETIRAcetam 250 MG tablet Commonly known as:  KEPPRA Take 125-250 mg by mouth See admin instructions. Takes one tablet in the morning and half a tablet at 3 pm.   levothyroxine 25 MCG tablet Commonly known as:  SYNTHROID, LEVOTHROID Take 25 mcg by mouth daily before breakfast.   Melatonin 3 MG Tabs Take 1 tablet by mouth at bedtime.   midodrine 2.5 MG tablet Commonly known as:  PROAMATINE Take 1 tablet (2.5 mg total) by mouth 2 (two) times daily with a meal. The dosing can be titrated down to once daily when her systolic blood pressure is consistently in the 100s.   MIN-O-EAR Liqd Place 2 drops in ear(s) every Monday. At bedtime for stopped up ears   mometasone 0.1 % cream Commonly known as:  ELOCON Apply 1 application topically daily. To ears for eczema   MUSCLE RUB 10-15 % Crea Apply 1 application topically as needed for muscle pain.   nitroGLYCERIN 0.4 MG SL tablet Commonly known as:  NITROSTAT Place 0.4 mg under the tongue every 5 (five) minutes as needed for chest pain.   omeprazole 20 MG tablet Commonly known as:  PRILOSEC OTC Take 20 mg by mouth. Monday, Wednesday, & Friday evening.   potassium chloride 10 MEQ CR tablet Commonly known as:  KLOR-CON Take 10 mEq by mouth daily.   Pramox-PE-Glycerin-Petrolatum 1-0.25-14.4-15 % Crea Commonly known as:  PREPARATION H Use daily prn What changed:    how much to take  how  to take this  when to take this  reasons to take this  additional instructions   PROBIOTIC DAILY PO Take 1 tablet by mouth every morning.       Allergies  Allergen Reactions  . Ivp Dye [Iodinated Diagnostic Agents] Shortness Of Breath    ??  . Omnipaque [Iohexol] Shortness Of Breath and Other (See Comments)    Short of breath with chest tightness after IV injection in CT, pt was fine when she left department but developed symptoms in parking lot and went to emergency department.  . Sulfa Antibiotics Shortness Of Breath  . Carbamazepine     REACTION: toxemia  . Dilaudid [Hydromorphone] Nausea And Vomiting  . Tramadol Other (See Comments)    confusion    Consultations:     Procedures/Studies: Dg Chest 2 View  Result Date: 09/02/2017 CLINICAL DATA:  84 year old female with chest pain. Recent pneumonia. History of thoracic compression fractures due to coughing. EXAM: CHEST - 2 VIEW COMPARISON:  Thoracic spine radiographs 06/21/2017 and earlier. FINDINGS: PA and lateral views of the chest. Chronic somewhat large lung volumes with increased AP dimension to the chest. Stable cardiomegaly and mediastinal contours. Visualized tracheal air column is within normal limits. No pneumothorax, pulmonary edema, pleural effusion or confluent pulmonary opacity. Osteopenia with multilevel thoracic compression fractures which appear stable since January. Abdomen Calcified aortic atherosclerosis. Paucity of upper abdominal bowel gas. IMPRESSION: Stable.  No acute cardiopulmonary abnormality. Electronically Signed   By: Odessa Fleming M.D.   On: 09/02/2017 08:37    Echo: - Left ventricle: The cavity size was normal. Wall thickness was   normal. Systolic function was normal. The estimated ejection   fraction was in the range of 60% to 65%. Wall motion was normal;   there were no regional wall motion abnormalities. - Mitral valve: Calcified annulus. - Left atrium: The atrium was mildly dilated. - Right  atrium: The atrium was mildly dilated. - Tricuspid valve: There was mild-moderate regurgitation directed   centrally. - Pulmonary arteries: Systolic pressure was mildly increased. PA   peak pressure: 35 mm Hg (S).   Subjective: Feeling better, no further chest discomfort, no shortness of breath   Discharge Exam: Vitals:   09/03/17 0555 09/03/17 0749  BP:    Pulse:    Resp:    Temp: (!) 97.5 F (36.4 C)   SpO2:  97%   Vitals:   09/02/17 2221 09/03/17 0553 09/03/17 0555 09/03/17 0749  BP: 98/70 (!) 111/56    Pulse: (!) 107 89    Resp: 16 18    Temp: 97.7 F (36.5 C)  (!) 97.5 F (36.4 C)   TempSrc: Oral  Oral   SpO2: 96% 95%  97%  Weight:      Height:        General: Pt is alert, awake, not in acute distress Cardiovascular: RRR, S1/S2 +, no rubs, no gallops Respiratory: CTA bilaterally, no wheezing, no rhonchi Abdominal: Soft, NT, ND, bowel sounds + Extremities: no edema, no cyanosis    The results of significant diagnostics from this hospitalization (including imaging, microbiology, ancillary and laboratory) are listed below for reference.     Microbiology: No results found for this or any previous visit (from the past 240 hour(s)).   Labs: BNP (last 3 results) Recent Labs    12/04/16 1519 06/21/17 2113 09/02/17 0806  BNP 135.0* 47.0 72.0   Basic Metabolic Panel: Recent Labs  Lab 09/02/17 0806  NA 139  K 4.1  CL 101  CO2 26  GLUCOSE 93  BUN 15  CREATININE 0.58  CALCIUM 9.4   Liver Function Tests: No results for input(s): AST, ALT, ALKPHOS, BILITOT, PROT, ALBUMIN in the last 168 hours. No results for input(s): LIPASE, AMYLASE in the last 168 hours. No results for input(s): AMMONIA in the last 168 hours. CBC: Recent Labs  Lab 09/02/17 0806  WBC 5.1  NEUTROABS 3.8  HGB 13.6  HCT 43.4  MCV 92.9  PLT 160   Cardiac Enzymes: Recent Labs  Lab 09/02/17 0806 09/02/17 1031 09/02/17 1656 09/02/17 2255  TROPONINI <0.03 <0.03 <0.03 <0.03    BNP: Invalid input(s): POCBNP CBG: No results for input(s): GLUCAP in the last 168 hours. D-Dimer No results for input(s): DDIMER in the last 72 hours. Hgb A1c No results for input(s): HGBA1C in the last 72 hours. Lipid Profile No  results for input(s): CHOL, HDL, LDLCALC, TRIG, CHOLHDL, LDLDIRECT in the last 72 hours. Thyroid function studies No results for input(s): TSH, T4TOTAL, T3FREE, THYROIDAB in the last 72 hours.  Invalid input(s): FREET3 Anemia work up No results for input(s): VITAMINB12, FOLATE, FERRITIN, TIBC, IRON, RETICCTPCT in the last 72 hours. Urinalysis    Component Value Date/Time   COLORURINE YELLOW 06/21/2017 2111   APPEARANCEUR HAZY (A) 06/21/2017 2111   LABSPEC 1.027 06/21/2017 2111   PHURINE 5.0 06/21/2017 2111   GLUCOSEU NEGATIVE 06/21/2017 2111   HGBUR NEGATIVE 06/21/2017 2111   BILIRUBINUR NEGATIVE 06/21/2017 2111   KETONESUR NEGATIVE 06/21/2017 2111   PROTEINUR NEGATIVE 06/21/2017 2111   UROBILINOGEN 0.2 04/28/2012 1830   NITRITE NEGATIVE 06/21/2017 2111   LEUKOCYTESUR LARGE (A) 06/21/2017 2111   Sepsis Labs Invalid input(s): PROCALCITONIN,  WBC,  LACTICIDVEN Microbiology No results found for this or any previous visit (from the past 240 hour(s)).   Time coordinating discharge:  SIGNED:   Erick Blinks, MD  [facility] 09/03/2017, 10:22 AM Pager   If 7PM-7AM, please contact night-coverage [email or web address] Password [email or web address]

## 2017-09-03 NOTE — Progress Notes (Signed)
Removed IV-clean, dry, intact. Reviewed d/c paperwork with pt and daughter. Reviewed new medications and where to pick up. Answered all questions. Wheeled stable pt to main entrance where she was picked up by daughter. [facility]. [facility]

## 2017-09-14 ENCOUNTER — Emergency Department (HOSPITAL_COMMUNITY)
Admission: EM | Admit: 2017-09-14 | Discharge: 2017-09-14 | Disposition: A | Discharge status: Home / Self Care | Payer: Medicare Other | Attending: Emergency Medicine | Admitting: Emergency Medicine

## 2017-09-14 ENCOUNTER — Encounter (HOSPITAL_COMMUNITY): Payer: Self-pay

## 2017-09-14 ENCOUNTER — Emergency Department (HOSPITAL_COMMUNITY): Payer: Medicare Other

## 2017-09-14 DIAGNOSIS — J45909 Unspecified asthma, uncomplicated: Secondary | ICD-10-CM | POA: Insufficient documentation

## 2017-09-14 DIAGNOSIS — Z7982 Long term (current) use of aspirin: Secondary | ICD-10-CM | POA: Insufficient documentation

## 2017-09-14 DIAGNOSIS — I2510 Atherosclerotic heart disease of native coronary artery without angina pectoris: Secondary | ICD-10-CM | POA: Diagnosis not present

## 2017-09-14 DIAGNOSIS — I110 Hypertensive heart disease with heart failure: Secondary | ICD-10-CM | POA: Diagnosis not present

## 2017-09-14 DIAGNOSIS — F0280 Dementia in other diseases classified elsewhere without behavioral disturbance: Secondary | ICD-10-CM | POA: Diagnosis not present

## 2017-09-14 DIAGNOSIS — G309 Alzheimer's disease, unspecified: Secondary | ICD-10-CM | POA: Insufficient documentation

## 2017-09-14 DIAGNOSIS — Z79899 Other long term (current) drug therapy: Secondary | ICD-10-CM | POA: Insufficient documentation

## 2017-09-14 DIAGNOSIS — I5032 Chronic diastolic (congestive) heart failure: Secondary | ICD-10-CM | POA: Insufficient documentation

## 2017-09-14 DIAGNOSIS — R079 Chest pain, unspecified: Secondary | ICD-10-CM | POA: Insufficient documentation

## 2017-09-14 DIAGNOSIS — Z87891 Personal history of nicotine dependence: Secondary | ICD-10-CM | POA: Diagnosis not present

## 2017-09-14 LAB — CBC
HEMATOCRIT: 44.2 % (ref 36.0–46.0)
HEMOGLOBIN: 14.1 g/dL (ref 12.0–15.0)
MCH: 29.7 pg (ref 26.0–34.0)
MCHC: 31.9 g/dL (ref 30.0–36.0)
MCV: 93.2 fL (ref 78.0–100.0)
Platelets: 152 10*3/uL (ref 150–400)
RBC: 4.74 MIL/uL (ref 3.87–5.11)
RDW: 16 % — ABNORMAL HIGH (ref 11.5–15.5)
WBC: 4.3 10*3/uL (ref 4.0–10.5)

## 2017-09-14 LAB — BASIC METABOLIC PANEL
Anion gap: 10 (ref 5–15)
BUN: 16 mg/dL (ref 6–20)
CO2: 27 mmol/L (ref 22–32)
Calcium: 9.9 mg/dL (ref 8.9–10.3)
Chloride: 106 mmol/L (ref 101–111)
Creatinine, Ser: 0.58 mg/dL (ref 0.44–1.00)
Glucose, Bld: 100 mg/dL — ABNORMAL HIGH (ref 65–99)
POTASSIUM: 4.3 mmol/L (ref 3.5–5.1)
Sodium: 143 mmol/L (ref 135–145)

## 2017-09-14 LAB — I-STAT TROPONIN, ED
Troponin i, poc: 0.01 ng/mL (ref 0.00–0.08)
Troponin i, poc: 0.01 ng/mL (ref 0.00–0.08)

## 2017-09-14 MED ORDER — ALBUTEROL SULFATE (2.5 MG/3ML) 0.083% IN NEBU
5.0000 mg | INHALATION_SOLUTION | Freq: Once | RESPIRATORY_TRACT | Status: AC
  Administered 2017-09-14: 5 mg via RESPIRATORY_TRACT
  Filled 2017-09-14: qty 6

## 2017-09-14 NOTE — ED Triage Notes (Signed)
Pt is a resident of [facility], tonight with mid sternal chest pressure with pain in left arm and shoulder.  Pt was given 3 ntg sl at the facility with minimal relief.

## 2017-09-14 NOTE — ED Notes (Signed)
Patient transported to X-ray 

## 2017-09-14 NOTE — Discharge Instructions (Addendum)
Continue your medications as before.  Follow-up with your cardiologist in the next 3 to 4 days, and return to the ER if symptoms significantly worsen or change.

## 2017-09-14 NOTE — ED Provider Notes (Signed)
[facility] EMERGENCY DEPARTMENT Provider Note   CSN: [id number] Arrival date & time: 09/14/17  0149     History   Chief Complaint Chief Complaint  Patient presents with  . Chest Pain    HPI Deborah [name] is a 84 y.o. female.  Patient is an 84 year old female with past medical history of dementia, chronic atrial fibrillation, COPD.  She presents today for evaluation of chest pain.  She lives in an assisted living facility where this evening she developed chest discomfort.  She is currently pain-free and due to her memory issues, is unable to describe what she experienced.  She cannot specify whether she was short of breath, nauseated, or diaphoretic.  She was given nitroglycerin which I am told seem to help.  She had a similar episode 2 weeks ago for which she was admitted and ruled out.  She had an echocardiogram performed, then was discharged back to the extended care facility.     Past Medical History:  Diagnosis Date  . Alzheimer disease   . Asthma   . Atrial fibrillation (HCC)    Permanent  . Chronic anticoagulation    Xarelto discontinued after fall  . Chronic diastolic heart failure (HCC)   . COPD (chronic obstructive pulmonary disease) (HCC)   . Coronary atherosclerosis of native coronary artery    BMS circumflex 2009  . Dementia without behavioral disturbance   . Drug intolerance    Flecacinide and Amiodarone   . Fracture of multiple pubic rami (HCC) 12/13   Left superior and inferior - following fall  . Hypotension   . Mixed hyperlipidemia   . Osteoarthritis   . Retroperitoneal hemorrhage 12/13   Following fall  . Seizures (HCC)    Remote history of over 20 years ago  . Umbilical hernia     Patient Active Problem List   Diagnosis Date Noted  . Chest pain 09/02/2017  . Compression fracture of body of thoracic vertebra (HCC) 06/22/2017  . Atrial fibrillation with rapid ventricular response (HCC)   . Screening for colorectal cancer 03/24/2017  .  Hemorrhoids 03/24/2017  . Itching in the vaginal area 03/24/2017  . Vulval thrush 03/24/2017  . Hypoxemia   . COPD with acute exacerbation (HCC) 09/13/2016  . Alzheimer disease 09/13/2016  . Seizures (HCC) 09/13/2016  . Hypokalemia 09/13/2016  . Atrial fibrillation with RVR (HCC) 09/13/2016  . COPD exacerbation (HCC) 09/13/2016  . Hypotension 04/22/2012  . Coronary atherosclerosis of native coronary artery   . COPD (chronic obstructive pulmonary disease) (HCC)   . Mixed hyperlipidemia 05/14/2009  . Permanent atrial fibrillation (HCC) 02/10/2009  . DIASTOLIC HEART FAILURE, CHRONIC 02/10/2009    Past Surgical History:  Procedure Laterality Date  . CATARACT EXTRACTION    . Radiofrequency catheter ablation     Failed  . Right carpel tunnel release    . TONSILLECTOMY       OB History    Gravida  1   Para  1   Term  1   Preterm      AB      Living  1     SAB      TAB      Ectopic      Multiple      Live Births  1            Home Medications    Prior to Admission medications   Medication Sig Start Date End Date Taking? Authorizing Provider  acetaminophen (TYLENOL) 500 MG  tablet Take 500 mg by mouth every 6 (six) hours as needed for mild pain. For pain     [provider]  albuterol (PROVENTIL HFA;VENTOLIN HFA) 108 (90 Base) MCG/ACT inhaler Inhale 2 puffs into the lungs every 6 (six) hours as needed for wheezing or shortness of breath.    [provider]  albuterol (PROVENTIL) (2.5 MG/3ML) 0.083% nebulizer solution Take 2.5 mg by nebulization every 6 (six) hours as needed for wheezing or shortness of breath.     [provider]  alendronate (FOSAMAX) 70 MG tablet Take 70 mg by mouth every Wednesday. Take with a full glass of water on an empty stomach.     [provider]  aspirin (ASPIRIN EC) 81 MG EC tablet Take 1 tablet (81 mg total) by mouth daily. Swallow whole. 05/04/12   [name], MD  atorvastatin (LIPITOR) 40  MG tablet Take 40 mg by mouth. Monday, Wednesday, & Friday evening.    [provider]  calcium citrate-vitamin D 500-400 MG-UNIT chewable tablet Chew 1 tablet by mouth 2 (two) times daily.    [provider]  digoxin (LANOXIN) 0.125 MG tablet Take 1 tablet (0.125 mg total) by mouth every evening. 09/21/16   [name], [name], MD  diltiazem (CARDIZEM) 30 MG tablet Take 1 tablet (30 mg total) by mouth every 6 (six) hours. 09/21/16   [name], [name], MD  docusate sodium (COLACE) 100 MG capsule Take 100 mg by mouth at bedtime.    [provider]  fluconazole (DIFLUCAN) 100 MG tablet Take 100 mg by mouth daily.    [provider]  fluticasone (FLONASE) 50 MCG/ACT nasal spray Place 2 sprays into both nostrils daily.    [provider]  furosemide (LASIX) 20 MG tablet Take 10 mg by mouth 2 (two) times daily.     [provider]  guaiFENesin (MUCINEX) 600 MG 12 hr tablet Take 2 tablets (1,200 mg total) by mouth 2 (two) times daily. 09/21/16   [name], [name], MD  isosorbide mononitrate (IMDUR) 30 MG 24 hr tablet Take 0.5 tablets (15 mg total) by mouth daily. 09/03/17   Erick Blinks, MD  levETIRAcetam (KEPPRA) 250 MG tablet Take 125-250 mg by mouth See admin instructions. Takes one tablet in the morning and half a tablet at 3 pm.    [provider]  levothyroxine (SYNTHROID, LEVOTHROID) 25 MCG tablet Take 25 mcg by mouth daily before breakfast.    [provider]  Melatonin 3 MG TABS Take 1 tablet by mouth at bedtime.    [provider]  Menthol-Methyl Salicylate (MUSCLE RUB) 10-15 % CREA Apply 1 application topically as needed for muscle pain. 06/23/17   Johnson, Clanford L, MD  midodrine (PROAMATINE) 2.5 MG tablet Take 1 tablet (2.5 mg total) by mouth 2 (two) times daily with a meal. The dosing can be titrated down to once daily when her systolic blood pressure is consistently in the 100s. 05/04/12    [name], MD  Mineral Oil-Oleth-Polysorbate (MIN-O-EAR) LIQD Place 2 drops in ear(s) every Monday. At bedtime for stopped up ears    [provider]  mometasone (ELOCON) 0.1 % cream Apply 1 application topically daily. To ears for eczema    [provider]  Multiple Vitamins-Minerals (ICAPS) CAPS Take 1 capsule by mouth 2 (two) times daily.    [provider]  nitroGLYCERIN (NITROSTAT) 0.4 MG SL tablet Place 0.4 mg under the tongue every 5 (five) minutes as  needed for chest pain.    [provider]  omeprazole (PRILOSEC OTC) 20 MG tablet Take 20 mg by mouth. Monday, Wednesday, & Friday evening.    [provider]  potassium chloride (KLOR-CON) 10 MEQ CR tablet Take 10 mEq by mouth daily.      [provider]  Pramox-PE-Glycerin-Petrolatum (PREPARATION H) 1-0.25-14.4-15 % CREA Use daily prn Patient taking differently: Place 1 application rectally daily as needed (for hemorrhoids). Use daily prn 03/24/17   Adline PotterGriffin, Jennifer A, NP  Probiotic Product (PROBIOTIC DAILY PO) Take 1 tablet by mouth every morning.    [provider]    Family History Family History  Problem Relation Age of Onset  . Coronary artery disease Mother   . Tuberculosis Paternal Grandfather   . Heart attack Maternal Grandmother     Social History Social History   Tobacco Use  . Smoking status: Former Smoker    Packs/day: 0.80    Years: 25.00    Pack years: 20.00    Types: Cigarettes    Last attempt to quit: 05/23/1990    Years since quitting: 27.3  . Smokeless tobacco: Never Used  Substance Use Topics  . Alcohol use: No    Alcohol/week: 0.0 oz  . Drug use: No     Allergies   Ivp dye [iodinated diagnostic agents]; Omnipaque [iohexol]; Sulfa antibiotics; Carbamazepine; Dilaudid [hydromorphone]; and Tramadol   Review of Systems Review of Systems  All other systems reviewed and are negative.    Physical Exam Updated Vital Signs There were  no vitals taken for this visit.  Physical Exam  Constitutional: She is oriented to person, place, and time. She appears well-developed and well-nourished. No distress.  HENT:  Head: Normocephalic and atraumatic.  Neck: Normal range of motion. Neck supple.  Cardiovascular: Normal rate. An irregularly irregular rhythm present. Exam reveals no gallop and no friction rub.  No murmur heard. Pulmonary/Chest: Effort normal and breath sounds normal. No respiratory distress. She has no wheezes.  Abdominal: Soft. Bowel sounds are normal. She exhibits no distension. There is no tenderness.  Musculoskeletal: Normal range of motion.  Neurological: She is alert and oriented to person, place, and time.  Skin: Skin is warm and dry. She is not diaphoretic.  Nursing note and vitals reviewed.    ED Treatments / Results  Labs (all labs ordered are listed, but only abnormal results are displayed) Labs Reviewed  BASIC METABOLIC PANEL - Abnormal; Notable for the following components:      Result Value   Glucose, Bld 100 (*)    All other components within normal limits  CBC - Abnormal; Notable for the following components:   RDW 16.0 (*)    All other components within normal limits  I-STAT TROPONIN, ED    EKG EKG Interpretation  Date/Time:  Thursday September 14 2017 02:18:18 EDT Ventricular Rate:  105 PR Interval:    QRS Duration: 80 QT Interval:  340 QTC Calculation: 450 R Axis:   56 Text Interpretation:  Atrial fibrillation Nonspecific repol abnormality, diffuse leads Confirmed by Geoffery LyonseLo, Aron Needles ([id number]) on 09/14/2017 4:01:13 AM   Radiology Dg Chest 2 View  Result Date: 09/14/2017 CLINICAL DATA:  Midsternal chest pressure EXAM: CHEST - 2 VIEW COMPARISON:  09/02/2017 FINDINGS: Stable cardiomegaly with moderate aortic atherosclerosis. Hyperinflated lungs with chronic interstitial prominence. No alveolar consolidation or CHF. No effusion or pneumothorax. Thoracic kyphosis attributable to multiple  mid to upper thoracic compression fractures, chronic in appearance. IMPRESSION: Hyperinflated lungs with stable cardiomegaly  and aortic atherosclerosis. No active pulmonary disease. Osteoporotic compressions of the dorsal spine with resultant thoracic kyphosis, stable in appearance. Electronically Signed   By: Tollie Eth M.D.   On: 09/14/2017 03:27    Procedures Procedures (including critical care time)  Medications Ordered in ED Medications - No data to display   Initial Impression / Assessment and Plan / ED Course  I have reviewed the triage vital signs and the nursing notes.  Pertinent labs & imaging results that were available during my care of the patient were reviewed by me and considered in my medical decision making (see chart for details).  Patient presents with complaints of chest discomfort that began while at the extended care facility.  Her history is somewhat vague as she has a history of dementia and cannot adequately describe what she experienced.  By the time she arrived here, it appears as though her chest pain has resolved.  She currently has no complaints.  Initial troponin was negative and delta troponin was also negative.  Her EKG shows atrial fibrillation, but is unchanged from prior studies.  I had a lengthy discussion with the patient's daughter regarding the results of the test and disposition.  Patient's daughter is a retired [employer] fundraiser and has decided that she is comfortable with her mother returning to her extended care facility.  He was admitted 2 weeks ago with similar complaints, then ruled out for MI.  She had an echocardiogram which was not concerning.  I feel as though discharge is appropriate.  I doubt a cardiac etiology, but will have her follow-up with Dr. Diona Browner, her cardiologist.  She is to return if her symptoms worsen or change.  Final Clinical Impressions(s) / ED Diagnoses   Final diagnoses:  None    ED Discharge Orders    None       Geoffery Lyons, MD 09/14/17 [phone]

## 2017-09-14 NOTE — ED Notes (Signed)
Patient denies pain and is resting comfortably.  

## 2017-09-29 ENCOUNTER — Ambulatory Visit (INDEPENDENT_AMBULATORY_CARE_PROVIDER_SITE_OTHER): Payer: Medicare Other | Admitting: Cardiology

## 2017-09-29 ENCOUNTER — Encounter: Payer: Self-pay | Admitting: Cardiology

## 2017-09-29 ENCOUNTER — Encounter

## 2017-09-29 VITALS — BP 90/56 | HR 88 | Ht 62.0 in | Wt 138.0 lb

## 2017-09-29 DIAGNOSIS — F02818 Dementia in other diseases classified elsewhere, unspecified severity, with other behavioral disturbance: Secondary | ICD-10-CM

## 2017-09-29 DIAGNOSIS — G308 Other Alzheimer's disease: Secondary | ICD-10-CM | POA: Diagnosis not present

## 2017-09-29 DIAGNOSIS — I4821 Permanent atrial fibrillation: Secondary | ICD-10-CM

## 2017-09-29 DIAGNOSIS — J439 Emphysema, unspecified: Secondary | ICD-10-CM | POA: Diagnosis not present

## 2017-09-29 DIAGNOSIS — I2510 Atherosclerotic heart disease of native coronary artery without angina pectoris: Secondary | ICD-10-CM | POA: Diagnosis not present

## 2017-09-29 DIAGNOSIS — Z9861 Coronary angioplasty status: Secondary | ICD-10-CM | POA: Diagnosis not present

## 2017-09-29 DIAGNOSIS — I959 Hypotension, unspecified: Secondary | ICD-10-CM

## 2017-09-29 DIAGNOSIS — I482 Chronic atrial fibrillation: Secondary | ICD-10-CM | POA: Diagnosis not present

## 2017-09-29 DIAGNOSIS — E782 Mixed hyperlipidemia: Secondary | ICD-10-CM

## 2017-09-29 DIAGNOSIS — F0281 Dementia in other diseases classified elsewhere with behavioral disturbance: Secondary | ICD-10-CM

## 2017-09-29 DIAGNOSIS — R079 Chest pain, unspecified: Secondary | ICD-10-CM | POA: Diagnosis not present

## 2017-09-29 MED ORDER — PANTOPRAZOLE SODIUM 40 MG PO TBEC: 40.0000 mg | DELAYED_RELEASE_TABLET | Freq: Every day | ORAL | 11 refills | Status: DC

## 2017-09-29 NOTE — Assessment & Plan Note (Signed)
Lives at [facility].

## 2017-09-29 NOTE — Assessment & Plan Note (Signed)
Admitted 09/02/17 for r/o, seen again in the ED 4/25 with chest pain

## 2017-09-29 NOTE — Progress Notes (Signed)
09/29/2017 Deborah [name]   Apr 26, 1926  [id number]  Primary Physician Marlowe Aschoff, FNP Primary Cardiologist: Dr Diona Browner  HPI:  Pleasant 84 y/o female with a history of Alzheimer's dementia, resident of [facility]. She was taken to the ED 09/02/17 after she woke up complaining of chest pain. Her Troponin were negative, EKG without acute changes and her echo showed normal LVF. Imdur was added. On 09/14/17 she was taken back to the ED with SSCP, Lt arm pain, not relieved with SL NTG. The pt's daughter says the ED physician suspected a COPD exacerbation and suggested increasing her mothers inhalers to Q^ instead of Q6 prn. The pt has done well since.    Current Outpatient Medications  Medication Sig Dispense Refill  . acetaminophen (TYLENOL) 500 MG tablet Take 500 mg by mouth every 6 (six) hours as needed for mild pain. For pain     . albuterol (PROVENTIL HFA;VENTOLIN HFA) 108 (90 Base) MCG/ACT inhaler Inhale 2 puffs into the lungs every 6 (six) hours as needed for wheezing or shortness of breath.    Marland Kitchen albuterol (PROVENTIL) (2.5 MG/3ML) 0.083% nebulizer solution Take 2.5 mg by nebulization every 6 (six) hours as needed for wheezing or shortness of breath.     Marland Kitchen alendronate (FOSAMAX) 70 MG tablet Take 70 mg by mouth every Wednesday. Take with a full glass of water on an empty stomach.     Marland Kitchen aspirin (ASPIRIN EC) 81 MG EC tablet Take 1 tablet (81 mg total) by mouth daily. Swallow whole.    Marland Kitchen atorvastatin (LIPITOR) 40 MG tablet Take 40 mg by mouth. Monday, Wednesday, & Friday evening.    . calcium citrate-vitamin D 500-400 MG-UNIT chewable tablet Chew 1 tablet by mouth 2 (two) times daily.    . digoxin (LANOXIN) 0.125 MG tablet Take 1 tablet (0.125 mg total) by mouth every evening. 30 tablet 2  . diltiazem (CARDIZEM) 30 MG tablet Take 1 tablet (30 mg total) by mouth every 6 (six) hours. 120 tablet 2  . docusate sodium (COLACE) 100 MG capsule Take 100 mg by mouth at bedtime.    .  fluconazole (DIFLUCAN) 100 MG tablet Take 100 mg by mouth daily.    . fluticasone (FLONASE) 50 MCG/ACT nasal spray Place 2 sprays into both nostrils daily.    . furosemide (LASIX) 20 MG tablet Take 10 mg by mouth 2 (two) times daily.     Marland Kitchen guaiFENesin (MUCINEX) 600 MG 12 hr tablet Take 2 tablets (1,200 mg total) by mouth 2 (two) times daily.    . isosorbide mononitrate (IMDUR) 30 MG 24 hr tablet Take 0.5 tablets (15 mg total) by mouth daily. 30 tablet 0  . levETIRAcetam (KEPPRA) 250 MG tablet Take 125-250 mg by mouth See admin instructions. Takes one tablet in the morning and half a tablet at 3 pm.    . levothyroxine (SYNTHROID, LEVOTHROID) 25 MCG tablet Take 25 mcg by mouth daily before breakfast.    . Melatonin 3 MG TABS Take 1 tablet by mouth at bedtime.    . Menthol-Methyl Salicylate (MUSCLE RUB) 10-15 % CREA Apply 1 application topically as needed for muscle pain.  0  . midodrine (PROAMATINE) 2.5 MG tablet Take 1 tablet (2.5 mg total) by mouth 2 (two) times daily with a meal. The dosing can be titrated down to once daily when her systolic blood pressure is consistently in the 100s.    [place] Oil-Oleth-Polysorbate (MIN-O-EAR) LIQD Place 2 drops in ear(s) every Monday.  At bedtime for stopped up ears    . mometasone (ELOCON) 0.1 % cream Apply 1 application topically daily. To ears for eczema    . montelukast (SINGULAIR) 10 MG tablet Take 10 mg by mouth at bedtime.   10  . Multiple Vitamins-Minerals (ICAPS) CAPS Take 1 capsule by mouth 2 (two) times daily.    . nitroGLYCERIN (NITROSTAT) 0.4 MG SL tablet Place 0.4 mg under the tongue every 5 (five) minutes as needed for chest pain.    . potassium chloride (KLOR-CON) 10 MEQ CR tablet Take 10 mEq by mouth daily.      . Pramox-PE-Glycerin-Petrolatum (PREPARATION H) 1-0.25-14.4-15 % CREA Use daily prn (Patient taking differently: Place 1 application rectally daily as needed (for hemorrhoids). Use daily prn)  0  . Probiotic Product (PROBIOTIC DAILY  PO) Take 1 tablet by mouth every morning.    . pantoprazole (PROTONIX) 40 MG tablet Take 1 tablet (40 mg total) by mouth daily. 30 tablet 11   No current facility-administered medications for this visit.     Allergies  Allergen Reactions  . Ivp Dye [Iodinated Diagnostic Agents] Shortness Of Breath    ??  . Omnipaque [Iohexol] Shortness Of Breath and Other (See Comments)    Short of breath with chest tightness after IV injection in CT, pt was fine when she left department but developed symptoms in parking lot and went to emergency department.  . Sulfa Antibiotics Shortness Of Breath  . Carbamazepine     REACTION: toxemia  . Dilaudid [Hydromorphone] Nausea And Vomiting  . Tramadol Other (See Comments)    confusion    Past Medical History:  Diagnosis Date  . Alzheimer disease   . Asthma   . Atrial fibrillation (HCC)    Permanent  . Chronic anticoagulation    Xarelto discontinued after fall  . Chronic diastolic heart failure (HCC)   . COPD (chronic obstructive pulmonary disease) (HCC)   . Coronary atherosclerosis of native coronary artery    BMS circumflex 2009  . Dementia without behavioral disturbance   . Drug intolerance    Flecacinide and Amiodarone   . Fracture of multiple pubic rami (HCC) 12/13   Left superior and inferior - following fall  . Hypotension   . Mixed hyperlipidemia   . Osteoarthritis   . Retroperitoneal hemorrhage 12/13   Following fall  . Seizures (HCC)    Remote history of over 20 years ago  . Umbilical hernia     Social History   Socioeconomic History  . Marital status: Widowed    Spouse name: Not on file  . Number of children: Not on file  . Years of education: Not on file  . Highest education level: Not on file  Occupational History  . Not on file  Social Needs  . Financial resource strain: Not on file  . Food insecurity:    Worry: Not on file    Inability: Not on file  . Transportation needs:    Medical: Not on file    Non-medical:  Not on file  Tobacco Use  . Smoking status: Former Smoker    Packs/day: 0.80    Years: 25.00    Pack years: 20.00    Types: Cigarettes    Last attempt to quit: 05/23/1990    Years since quitting: 27.3  . Smokeless tobacco: Never Used  Substance and Sexual Activity  . Alcohol use: No    Alcohol/week: 0.0 oz  . Drug use: No  . Sexual activity: Never  Birth control/protection: Post-menopausal  Lifestyle  . Physical activity:    Days per week: Not on file    Minutes per session: Not on file  . Stress: Not on file  Relationships  . Social connections:    Talks on phone: Not on file    Gets together: Not on file    Attends religious service: Not on file    Active member of club or organization: Not on file    Attends meetings of clubs or organizations: Not on file    Relationship status: Not on file  . Intimate partner violence:    Fear of current or ex partner: Not on file    Emotionally abused: Not on file    Physically abused: Not on file    Forced sexual activity: Not on file  Other Topics Concern  . Not on file  Social History Narrative   Widowed.      Family History  Problem Relation Age of Onset  . Coronary artery disease Mother   . Tuberculosis Paternal Grandfather   . Heart attack Maternal Grandmother      Review of Systems: General: negative for chills, fever, night sweats or weight changes.  Cardiovascular: negative for chest pain, dyspnea on exertion, edema, orthopnea, palpitations, paroxysmal nocturnal dyspnea or shortness of breath Dermatological: negative for rash Respiratory: negative for cough or wheezing Urologic: negative for hematuria Abdominal: negative for nausea, vomiting, diarrhea, bright red blood per rectum, melena, or hematemesis Neurologic: negative for visual changes, syncope, or dizziness All other systems reviewed and are otherwise negative except as noted above.    Blood pressure (!) 90/56, pulse 88, height  (1.575 m), weight  138 lb (62.6 kg), SpO2 92 %.  General appearance: alert, cooperative, appears stated age, cachectic and no distress Neck: no carotid bruit and no JVD Lungs: kyphosis, clear lungs Heart: regular rate and rhythm and short systolic murmur Extremities: no edema Skin: pale, cool, dry Neurologic: Grossly normal  EKG 09/14/17- AF rate 105  ASSESSMENT AND PLAN:   Chest pain Admitted 09/02/17 for r/o, seen again in the ED 4/25 with chest pain  CAD S/P percutaneous coronary angioplasty CFX BMS 2009-Myoview low risk 2011  Hypotension On Midodrine  Permanent atrial fibrillation (HCC) Not anticoagulated secondary to falls  COPD (chronic obstructive pulmonary disease) (HCC) Pt has been admitted twice this year with COPD exacerbation/CAP. She has a history of prior respiratory failure related to environmental factors. She is not allowed to go outside at her assisted living facility  Alzheimer disease Lives at [facility] facility.   PLAN  Difficult situation. She is not a candidate for aggressive work up and her chronically low B/P limits medical Rx. She is on Imdur 30 mg and will continue this.I though we could try changing her to Protonix just in case this is GERD. She is to se Dr Juanetta Gosling next week but currently her lungs are pretty clear.   Corine Shelter PA-C 09/29/2017 1:59 PM

## 2017-09-29 NOTE — Assessment & Plan Note (Signed)
CFX BMS 2009-Myoview low risk 2011

## 2017-09-29 NOTE — Assessment & Plan Note (Signed)
Not anticoagulated secondary to falls

## 2017-09-29 NOTE — Patient Instructions (Addendum)
Your physician recommends that you schedule a follow-up appointment in:  3 months Dr.McDowell     STOP Prilosec   START Protonix 40 mg daily    If you need a refill on your cardiac medications before your next appointment, please call your pharmacy.     No lab work or tests ordered today.      Thank you for choosing [facility] !

## 2017-09-29 NOTE — Assessment & Plan Note (Signed)
Pt has been admitted twice this year with COPD exacerbation/CAP. She has a history of prior respiratory failure related to environmental factors. She is not allowed to go outside at her assisted living facility

## 2017-09-29 NOTE — Assessment & Plan Note (Signed)
On Midodrine 

## 2017-11-16 ENCOUNTER — Ambulatory Visit (INDEPENDENT_AMBULATORY_CARE_PROVIDER_SITE_OTHER): Payer: Medicare Other | Admitting: Otolaryngology

## 2017-11-16 DIAGNOSIS — H608X3 Other otitis externa, bilateral: Secondary | ICD-10-CM

## 2017-11-16 DIAGNOSIS — H6123 Impacted cerumen, bilateral: Secondary | ICD-10-CM | POA: Diagnosis not present

## 2018-01-01 NOTE — Progress Notes (Signed)
Cardiology Office Note  Date: 01/02/2018   ID: Deborah FeySara C [name], DOB June 18, 1925, MRN [id number]  PCP: Marlowe AschoffPolite, Latrice, FNP  Primary Cardiologist: Nona DellSamuel Zinedine Ellner, MD   Chief Complaint  Patient presents with  . Atrial Fibrillation    History of Present Illness: Deborah [name] is an 84 y.o. female last seen by Mr. Lenn CalKilroy PA-C in May.  She is here with her daughter today.  [facility].  Reports no palpitations or chest pain, ambulates regularly at her facility with a walker.  She has seen Dr. Juanetta GoslingHawkins and is doing much better in terms of shortness of breath following medication adjustments.  She is not anticoagulated with previous history of falls, retroperitoneal hemorrhage, chronically low blood pressure, and declining vision.  I reviewed her current cardiac medications which are listed below.  Past Medical History:  Diagnosis Date  . Alzheimer disease   . Asthma   . Atrial fibrillation (HCC)    Permanent  . Chronic anticoagulation    Xarelto discontinued after fall  . Chronic diastolic heart failure (HCC)   . COPD (chronic obstructive pulmonary disease) (HCC)   . Coronary atherosclerosis of native coronary artery    BMS circumflex 2009  . Dementia without behavioral disturbance   . Drug intolerance    Flecacinide and Amiodarone   . Fracture of multiple pubic rami (HCC) 12/13   Left superior and inferior - following fall  . Hypotension   . Mixed hyperlipidemia   . Osteoarthritis   . Retroperitoneal hemorrhage 12/13   Following fall  . Seizures (HCC)    Remote history of over 20 years ago  . Umbilical hernia     Past Surgical History:  Procedure Laterality Date  . CATARACT EXTRACTION    . Radiofrequency catheter ablation     Failed  . Right carpel tunnel release    . TONSILLECTOMY      Current Outpatient Medications  Medication Sig Dispense Refill  . acetaminophen (TYLENOL) 500 MG tablet Take 500 mg by mouth every 6  (six) hours as needed for mild pain. For pain     . albuterol (PROVENTIL HFA;VENTOLIN HFA) 108 (90 Base) MCG/ACT inhaler Inhale 2 puffs into the lungs every 6 (six) hours as needed for wheezing or shortness of breath.    Marland Kitchen. albuterol (PROVENTIL) (2.5 MG/3ML) 0.083% nebulizer solution Take 2.5 mg by nebulization every 6 (six) hours as needed for wheezing or shortness of breath.     Marland Kitchen. alendronate (FOSAMAX) 70 MG tablet Take 70 mg by mouth every Wednesday. Take with a full glass of water on an empty stomach.     Marland Kitchen. aspirin (ASPIRIN EC) 81 MG EC tablet Take 1 tablet (81 mg total) by mouth daily. Swallow whole.    Marland Kitchen. atorvastatin (LIPITOR) 40 MG tablet Take 40 mg by mouth. Monday, Wednesday, & Friday evening.    . calcium citrate-vitamin D 500-400 MG-UNIT chewable tablet Chew 1 tablet by mouth 2 (two) times daily.    . digoxin (LANOXIN) 0.125 MG tablet Take 1 tablet (0.125 mg total) by mouth every evening. 30 tablet 2  . diltiazem (CARDIZEM) 30 MG tablet Take 1 tablet (30 mg total) by mouth every 6 (six) hours. 120 tablet 2  . docusate sodium (COLACE) 100 MG capsule Take 100 mg by mouth at bedtime.    . fluconazole (DIFLUCAN) 100 MG tablet Take 100 mg by mouth daily.    . fluticasone (FLONASE) 50 MCG/ACT nasal spray Place 2  sprays into both nostrils daily.    . furosemide (LASIX) 20 MG tablet Take 10 mg by mouth 2 (two) times daily.     Marland Kitchen. guaiFENesin (MUCINEX) 600 MG 12 hr tablet Take 2 tablets (1,200 mg total) by mouth 2 (two) times daily.    . isosorbide mononitrate (IMDUR) 30 MG 24 hr tablet Take 0.5 tablets (15 mg total) by mouth daily. 30 tablet 0  . levETIRAcetam (KEPPRA) 250 MG tablet Take 125-250 mg by mouth See admin instructions. Takes one tablet in the morning and half a tablet at 3 pm.    . levothyroxine (SYNTHROID, LEVOTHROID) 25 MCG tablet Take 25 mcg by mouth daily before breakfast.    . Melatonin 3 MG TABS Take 1 tablet by mouth at bedtime.    . Menthol-Methyl Salicylate (MUSCLE RUB) 10-15  % CREA Apply 1 application topically as needed for muscle pain.  0  . midodrine (PROAMATINE) 2.5 MG tablet Take 1 tablet (2.5 mg total) by mouth 2 (two) times daily with a meal. The dosing can be titrated down to once daily when her systolic blood pressure is consistently in the 100s.    Benson Setting. Mineral Oil-Oleth-Polysorbate (MIN-O-EAR) LIQD Place 2 drops in ear(s) every Monday. At bedtime for stopped up ears    . mometasone (ELOCON) 0.1 % cream Apply 1 application topically daily. To ears for eczema    . montelukast (SINGULAIR) 10 MG tablet Take 10 mg by mouth at bedtime.   10  . Multiple Vitamins-Minerals (ICAPS) CAPS Take 1 capsule by mouth 2 (two) times daily.    . nitroGLYCERIN (NITROSTAT) 0.4 MG SL tablet Place 0.4 mg under the tongue every 5 (five) minutes as needed for chest pain.    . pantoprazole (PROTONIX) 40 MG tablet Take 1 tablet (40 mg total) by mouth daily. 30 tablet 11  . potassium chloride (KLOR-CON) 10 MEQ CR tablet Take 10 mEq by mouth daily.      . Pramox-PE-Glycerin-Petrolatum (PREPARATION H) 1-0.25-14.4-15 % CREA Use daily prn (Patient taking differently: Place 1 application rectally daily as needed (for hemorrhoids). Use daily prn)  0  . Probiotic Product (PROBIOTIC DAILY PO) Take 1 tablet by mouth every morning.    . topiramate (TOPAMAX) 25 MG tablet Take 1 tablet by mouth daily.  9  . TRELEGY ELLIPTA 100-62.5-25 MCG/INH AEPB Inhale 1 puff into the lungs daily.  9   No current facility-administered medications for this visit.    Allergies:  Ivp dye [iodinated diagnostic agents]; Omnipaque [iohexol]; Sulfa antibiotics; Carbamazepine; Dilaudid [hydromorphone]; and Tramadol   Social History: The patient  reports that she quit smoking about 27 years ago. Her smoking use included cigarettes. She has a 20.00 pack-year smoking history. She has never used smokeless tobacco. She reports that she does not drink alcohol or use drugs.   ROS:  Please see the history of present illness.  Otherwise, complete review of systems is positive for hearing loss.  All other systems are reviewed and negative.   Physical Exam: VS:  BP (!) 106/58   Pulse (!) 57   Ht 5\' 5"  (1.651 m)   Wt 139 lb 9.6 oz (63.3 kg)   SpO2 94% Comment: on room air  BMI 23.23 kg/m , BMI Body mass index is 23.23 kg/m.  Wt Readings from Last 3 Encounters:  01/02/18 139 lb 9.6 oz (63.3 kg)  09/29/17 138 lb (62.6 kg)  09/02/17 138 lb 0.1 oz (62.6 kg)    General: Elderly woman, appears comfortable at  rest. HEENT: Conjunctiva and lids normal, oropharynx clear. Neck: Supple, no elevated JVP or carotid bruits, no thyromegaly. Lungs: Clear to auscultation, nonlabored breathing at rest. Cardiac: Irregularly irregular, no S3, soft systolic murmur. Abdomen: Soft, nontender, bowel sounds present. Extremities: No pitting edema, distal pulses 2+.  ECG: I personally reviewed the tracing from 09/14/2017 which showed atrial fibrillation with nonspecific ST changes.  Recent Labwork: 06/21/2017: Magnesium 2.3; TSH 5.004 09/02/2017: B Natriuretic Peptide 72.0 09/14/2017: BUN 16; Creatinine, Ser 0.58; Hemoglobin 14.1; Platelets 152; Potassium 4.3; Sodium 143   Other Studies Reviewed Today:  Echocardiogram 09/02/2017: Study Conclusions  - Left ventricle: The cavity size was normal. Wall thickness was   normal. Systolic function was normal. The estimated ejection   fraction was in the range of 60% to 65%. Wall motion was normal;   there were no regional wall motion abnormalities. - Mitral valve: Calcified annulus. - Left atrium: The atrium was mildly dilated. - Right atrium: The atrium was mildly dilated. - Tricuspid valve: There was mild-moderate regurgitation directed   centrally. - Pulmonary arteries: Systolic pressure was mildly increased. PA   peak pressure: 35 mm Hg (S).  Assessment and Plan:  1.  Permanent atrial fibrillation.  Continue heart rate control with Cardizem.  She is not anticoagulated as per  previous discussion.  2.  Hypotension, blood pressure has been stable on Proamatine.  3.  CAD with prior history of BMS to the circumflex in 2009.  She does not report any active angina.  Continue aspirin and Lipitor.  Current medicines were reviewed with the patient today.  Disposition: Follow-up in 6 months.  Signed, Jonelle Sidle, MD, [facility] 01/02/2018 11:56 AM    [facility] at [facility] [address] [place], [place] [address] Phone: [phone]; Fax: [phone]

## 2018-01-02 ENCOUNTER — Ambulatory Visit (INDEPENDENT_AMBULATORY_CARE_PROVIDER_SITE_OTHER): Payer: Medicare Other | Admitting: Cardiology

## 2018-01-02 ENCOUNTER — Encounter: Payer: Self-pay | Admitting: Cardiology

## 2018-01-02 VITALS — BP 106/58 | HR 57 | Ht 65.0 in | Wt 139.6 lb

## 2018-01-02 DIAGNOSIS — I959 Hypotension, unspecified: Secondary | ICD-10-CM

## 2018-01-02 DIAGNOSIS — I482 Chronic atrial fibrillation: Secondary | ICD-10-CM | POA: Diagnosis not present

## 2018-01-02 DIAGNOSIS — I25119 Atherosclerotic heart disease of native coronary artery with unspecified angina pectoris: Secondary | ICD-10-CM | POA: Diagnosis not present

## 2018-01-02 DIAGNOSIS — I2510 Atherosclerotic heart disease of native coronary artery without angina pectoris: Secondary | ICD-10-CM

## 2018-01-02 DIAGNOSIS — I4821 Permanent atrial fibrillation: Secondary | ICD-10-CM

## 2018-01-02 DIAGNOSIS — Z9861 Coronary angioplasty status: Secondary | ICD-10-CM | POA: Diagnosis not present

## 2018-01-02 NOTE — Patient Instructions (Signed)
Your physician wants you to follow-up in:6 months with Dr.McDowell You will receive a reminder letter in the mail two months in advance. If you don't receive a letter, please call our office to schedule the follow-up appointment.    Your physician recommends that you continue on your current medications as directed. Please refer to the Current Medication list given to you today.    If you need a refill on your cardiac medications before your next appointment, please call your pharmacy.     No labs or tests today.       Thank you for choosing [facility] !        

## 2018-05-24 ENCOUNTER — Ambulatory Visit (INDEPENDENT_AMBULATORY_CARE_PROVIDER_SITE_OTHER): Payer: Medicare Other | Admitting: Otolaryngology

## 2018-05-24 DIAGNOSIS — H608X3 Other otitis externa, bilateral: Secondary | ICD-10-CM | POA: Diagnosis not present

## 2018-05-24 DIAGNOSIS — H6123 Impacted cerumen, bilateral: Secondary | ICD-10-CM

## 2018-06-26 ENCOUNTER — Encounter: Payer: Self-pay | Admitting: Orthopaedic Surgery

## 2018-07-10 ENCOUNTER — Ambulatory Visit (INDEPENDENT_AMBULATORY_CARE_PROVIDER_SITE_OTHER): Payer: Medicare Other | Admitting: Orthopaedic Surgery

## 2018-07-10 ENCOUNTER — Encounter: Payer: Self-pay | Admitting: Orthopaedic Surgery

## 2018-07-10 VITALS — BP 119/71 | HR 84 | Ht 65.0 in | Wt 150.0 lb

## 2018-07-10 DIAGNOSIS — G8929 Other chronic pain: Secondary | ICD-10-CM

## 2018-07-10 DIAGNOSIS — S22050A Wedge compression fracture of T5-T6 vertebra, initial encounter for closed fracture: Secondary | ICD-10-CM | POA: Diagnosis not present

## 2018-07-10 DIAGNOSIS — M25512 Pain in left shoulder: Secondary | ICD-10-CM | POA: Diagnosis not present

## 2018-07-10 NOTE — Progress Notes (Signed)
Subjective:    Patient ID: Deborah [name], female    DOB: 05/29/1925, 85 y.o.   MRN: [id number]  HPI She is a resident at [facility].  She had a problem with her back and left shoulder.  X-rays done at the rest home showed old compression fracture of T6 and T7 and T8.  She was given Mobic 15 and has done well on this with resolution of the back pain.  She has pain of the left shoulder.  It is only slightly better.  X-rays were negative of the shoulder.  I have reviewed the x-ray report.  I was unable to view the films.  She says the left shoulder hurts more in the morning. She has no trauma.  She has no numbness. She has used a patch on it and that helps.  She has poor vision.  I mentioned audio books from the [employer].     Review of Systems  Constitutional: Positive for activity change.  Eyes: Positive for visual disturbance.  Respiratory: Positive for shortness of breath. Negative for cough.   Musculoskeletal: Positive for arthralgias, back pain, gait problem and joint swelling.  All other systems reviewed and are negative.  For Review of Systems, all other systems reviewed and are negative.  The following is a summary of the past history medically, past history surgically, known current medicines, social history and family history.  This information is gathered electronically by the computer from prior information and documentation.  I review this each visit and have found including this information at this point in the chart is beneficial and informative.   Past Medical History:  Diagnosis Date  . Alzheimer disease (HCC)   . Asthma   . Atrial fibrillation (HCC)    Permanent  . Chronic anticoagulation    Xarelto discontinued after fall  . Chronic diastolic heart failure (HCC)   . COPD (chronic obstructive pulmonary disease) (HCC)   . Coronary atherosclerosis of native coronary artery    BMS circumflex 2009  . Dementia without behavioral disturbance (HCC)     . Drug intolerance    Flecacinide and Amiodarone   . Fracture of multiple pubic rami (HCC) 12/13   Left superior and inferior - following fall  . Hypotension   . Mixed hyperlipidemia   . Osteoarthritis   . Retroperitoneal hemorrhage 12/13   Following fall  . Seizures (HCC)    Remote history of over 20 years ago  . Umbilical hernia     Past Surgical History:  Procedure Laterality Date  . CATARACT EXTRACTION    . Radiofrequency catheter ablation     Failed  . Right carpel tunnel release    . TONSILLECTOMY      Current Outpatient Medications on File Prior to Visit  Medication Sig Dispense Refill  . acetaminophen (TYLENOL) 500 MG tablet Take 500 mg by mouth every 6 (six) hours as needed for mild pain. For pain     . albuterol (PROVENTIL HFA;VENTOLIN HFA) 108 (90 Base) MCG/ACT inhaler Inhale 2 puffs into the lungs every 6 (six) hours as needed for wheezing or shortness of breath.    Marland Kitchen albuterol (PROVENTIL) (2.5 MG/3ML) 0.083% nebulizer solution Take 2.5 mg by nebulization every 6 (six) hours as needed for wheezing or shortness of breath.     Marland Kitchen alendronate (FOSAMAX) 70 MG tablet Take 70 mg by mouth every Wednesday. Take with a full glass of water on an empty stomach.     Marland Kitchen aspirin (  ASPIRIN EC) 81 MG EC tablet Take 1 tablet (81 mg total) by mouth daily. Swallow whole.    Marland Kitchen atorvastatin (LIPITOR) 40 MG tablet Take 40 mg by mouth. Monday, Wednesday, & Friday evening.    . calcium citrate-vitamin D 500-400 MG-UNIT chewable tablet Chew 1 tablet by mouth 2 (two) times daily.    . digoxin (LANOXIN) 0.125 MG tablet Take 1 tablet (0.125 mg total) by mouth every evening. 30 tablet 2  . diltiazem (CARDIZEM) 30 MG tablet Take 1 tablet (30 mg total) by mouth every 6 (six) hours. 120 tablet 2  . docusate sodium (COLACE) 100 MG capsule Take 100 mg by mouth at bedtime.    . fluconazole (DIFLUCAN) 100 MG tablet Take 100 mg by mouth daily.    . fluticasone (FLONASE) 50 MCG/ACT nasal spray Place 2  sprays into both nostrils daily.    . furosemide (LASIX) 20 MG tablet Take 10 mg by mouth 2 (two) times daily.     Marland Kitchen guaiFENesin (MUCINEX) 600 MG 12 hr tablet Take 2 tablets (1,200 mg total) by mouth 2 (two) times daily.    . isosorbide mononitrate (IMDUR) 30 MG 24 hr tablet Take 0.5 tablets (15 mg total) by mouth daily. 30 tablet 0  . levETIRAcetam (KEPPRA) 250 MG tablet Take 125-250 mg by mouth See admin instructions. Takes one tablet in the morning and half a tablet at 3 pm.    . levothyroxine (SYNTHROID, LEVOTHROID) 25 MCG tablet Take 25 mcg by mouth daily before breakfast.    . Melatonin 3 MG TABS Take 1 tablet by mouth at bedtime.    . meloxicam (MOBIC) 15 MG tablet     . Menthol-Methyl Salicylate (MUSCLE RUB) 10-15 % CREA Apply 1 application topically as needed for muscle pain.  0  . midodrine (PROAMATINE) 2.5 MG tablet Take 1 tablet (2.5 mg total) by mouth 2 (two) times daily with a meal. The dosing can be titrated down to once daily when her systolic blood pressure is consistently in the 100s.    Benson Setting Oil-Oleth-Polysorbate (MIN-O-EAR) LIQD Place 2 drops in ear(s) every Monday. At bedtime for stopped up ears    . mometasone (ELOCON) 0.1 % cream Apply 1 application topically daily. To ears for eczema    . montelukast (SINGULAIR) 10 MG tablet Take 10 mg by mouth at bedtime.   10  . Multiple Vitamins-Minerals (ICAPS) CAPS Take 1 capsule by mouth 2 (two) times daily.    . pantoprazole (PROTONIX) 40 MG tablet Take 1 tablet (40 mg total) by mouth daily. 30 tablet 11  . potassium chloride (KLOR-CON) 10 MEQ CR tablet Take 10 mEq by mouth daily.      . Pramox-PE-Glycerin-Petrolatum (PREPARATION H) 1-0.25-14.4-15 % CREA Use daily prn (Patient taking differently: Place 1 application rectally daily as needed (for hemorrhoids). Use daily prn)  0  . prednisoLONE acetate (PRED FORTE) 1 % ophthalmic suspension     . Probiotic Product (PROBIOTIC DAILY PO) Take 1 tablet by mouth every morning.    .  topiramate (TOPAMAX) 25 MG tablet Take 1 tablet by mouth daily.  9  . TRELEGY ELLIPTA 100-62.5-25 MCG/INH AEPB Inhale 1 puff into the lungs daily.  9  . nitroGLYCERIN (NITROSTAT) 0.4 MG SL tablet Place 0.4 mg under the tongue every 5 (five) minutes as needed for chest pain.    . potassium chloride (K-DUR,KLOR-CON) 10 MEQ tablet      No current facility-administered medications on file prior to visit.  Social History   Socioeconomic History  . Marital status: Widowed    Spouse name: Not on file  . Number of children: Not on file  . Years of education: Not on file  . Highest education level: Not on file  Occupational History  . Not on file  Social Needs  . Financial resource strain: Not on file  . Food insecurity:    Worry: Not on file    Inability: Not on file  . Transportation needs:    Medical: Not on file    Non-medical: Not on file  Tobacco Use  . Smoking status: Former Smoker    Packs/day: 0.80    Years: 25.00    Pack years: 20.00    Types: Cigarettes    Last attempt to quit: 05/23/1990    Years since quitting: 28.1  . Smokeless tobacco: Never Used  Substance and Sexual Activity  . Alcohol use: No    Alcohol/week: 0.0 standard drinks  . Drug use: No  . Sexual activity: Never    Birth control/protection: Post-menopausal  Lifestyle  . Physical activity:    Days per week: Not on file    Minutes per session: Not on file  . Stress: Not on file  Relationships  . Social connections:    Talks on phone: Not on file    Gets together: Not on file    Attends religious service: Not on file    Active member of club or organization: Not on file    Attends meetings of clubs or organizations: Not on file    Relationship status: Not on file  . Intimate partner violence:    Fear of current or ex partner: Not on file    Emotionally abused: Not on file    Physically abused: Not on file    Forced sexual activity: Not on file  Other Topics Concern  . Not on file  Social  History Narrative   Widowed.     Family History  Problem Relation Age of Onset  . Coronary artery disease Mother   . Tuberculosis Paternal Grandfather   . Heart attack Maternal Grandmother     BP 119/71   Pulse 84   Ht 5\' 5"  (1.651 m)   Wt 150 lb (68 kg)   BMI 24.96 kg/m   Body mass index is 24.96 kg/m.      Objective:   Physical Exam Constitutional:      Appearance: She is well-developed.  HENT:     Head: Normocephalic and atraumatic.  Eyes:     Conjunctiva/sclera: Conjunctivae normal.     Pupils: Pupils are equal, round, and reactive to light.  Neck:     Musculoskeletal: Normal range of motion and neck supple.  Cardiovascular:     Rate and Rhythm: Normal rate and regular rhythm.  Pulmonary:     Effort: Pulmonary effort is normal.  Abdominal:     Palpations: Abdomen is soft.  Musculoskeletal:     Left shoulder: She exhibits tenderness.       Arms:  Skin:    General: Skin is warm and dry.  Neurological:     Mental Status: She is alert and oriented to person, place, and time.     Cranial Nerves: No cranial nerve deficit.     Motor: No abnormal muscle tone.     Coordination: Coordination normal.     Deep Tendon Reflexes: Reflexes are normal and symmetric. Reflexes normal.  Psychiatric:  Behavior: Behavior normal.        Thought Content: Thought content normal.        Judgment: Judgment normal.           Assessment & Plan:   Encounter Diagnoses  Name Primary?  . Chronic left shoulder pain Yes  . Compression fracture of T6 vertebra, initial encounter (HCC)    Her upper back pain is resolved.  She is to continue the patches and rubs for the left shoulder.  I will see her in three weeks.  Forms completed for the rest home.  Call if any problem.  Precautions discussed.   Electronically Signed Darreld Mclean, MD 2/18/202011:25 AM

## 2018-07-19 ENCOUNTER — Ambulatory Visit (INDEPENDENT_AMBULATORY_CARE_PROVIDER_SITE_OTHER): Payer: Medicare Other | Admitting: Otolaryngology

## 2018-07-19 DIAGNOSIS — R070 Pain in throat: Secondary | ICD-10-CM

## 2018-07-19 DIAGNOSIS — H6123 Impacted cerumen, bilateral: Secondary | ICD-10-CM | POA: Diagnosis not present

## 2018-08-01 ENCOUNTER — Ambulatory Visit (INDEPENDENT_AMBULATORY_CARE_PROVIDER_SITE_OTHER): Payer: Medicare Other | Admitting: Orthopaedic Surgery

## 2018-08-01 ENCOUNTER — Encounter: Payer: Self-pay | Admitting: Orthopaedic Surgery

## 2018-08-01 ENCOUNTER — Other Ambulatory Visit: Payer: Self-pay

## 2018-08-01 VITALS — BP 90/69 | HR 112 | Ht 65.0 in | Wt 156.0 lb

## 2018-08-01 DIAGNOSIS — G8929 Other chronic pain: Secondary | ICD-10-CM

## 2018-08-01 DIAGNOSIS — M25512 Pain in left shoulder: Secondary | ICD-10-CM | POA: Diagnosis not present

## 2018-08-01 DIAGNOSIS — S22050A Wedge compression fracture of T5-T6 vertebra, initial encounter for closed fracture: Secondary | ICD-10-CM

## 2018-08-01 NOTE — Progress Notes (Signed)
Patient Deborah [name], female DOB:December 05, 1925, 85 y.o. ZPH:[id number]  Chief Complaint  Patient presents with  . Shoulder Pain    HPI  Deborah [name] is a 85 y.o. female who has right shoulder pain and compression fracture of T6.  Her back is doing well.  She has some pain at times. But overall she is better.  Her right shoulder has an area of the upper trapezius that is tender but her motion is much better, NV intact. Grips are normal. ROM of neck is full.   Body mass index is 25.96 kg/m.  ROS  Review of Systems  Constitutional: Positive for activity change.  Eyes: Positive for visual disturbance.  Respiratory: Positive for shortness of breath. Negative for cough.   Musculoskeletal: Positive for arthralgias, back pain, gait problem and joint swelling.  All other systems reviewed and are negative.   All other systems reviewed and are negative.  The following is a summary of the past history medically, past history surgically, known current medicines, social history and family history.  This information is gathered electronically by the computer from prior information and documentation.  I review this each visit and have found including this information at this point in the chart is beneficial and informative.    Past Medical History:  Diagnosis Date  . Alzheimer disease (HCC)   . Asthma   . Atrial fibrillation (HCC)    Permanent  . Chronic anticoagulation    Xarelto discontinued after fall  . Chronic diastolic heart failure (HCC)   . COPD (chronic obstructive pulmonary disease) (HCC)   . Coronary atherosclerosis of native coronary artery    BMS circumflex 2009  . Dementia without behavioral disturbance (HCC)   . Drug intolerance    Flecacinide and Amiodarone   . Fracture of multiple pubic rami (HCC) 12/13   Left superior and inferior - following fall  . Hypotension   . Mixed hyperlipidemia   . Osteoarthritis   . 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 Seizures (HCC)    Remote history of over 20 years ago  . Umbilical hernia     Past Surgical History:  Procedure Laterality Date  . CATARACT EXTRACTION    . Radiofrequency catheter ablation     Failed  . Right carpel tunnel release    . TONSILLECTOMY      Family History  Problem Relation Age of Onset  . Coronary artery disease Mother   . Tuberculosis Paternal Grandfather   . Heart attack Maternal Grandmother     Social History Social History   Tobacco Use  . Smoking status: Former Smoker    Packs/day: 0.80    Years: 25.00    Pack years: 20.00    Types: Cigarettes    Last attempt to quit: 05/23/1990    Years since quitting: 28.2  . Smokeless tobacco: Never Used  Substance Use Topics  . Alcohol use: No    Alcohol/week: 0.0 standard drinks  . Drug use: No    Allergies  Allergen Reactions  . Ivp Dye [Iodinated Diagnostic Agents] Shortness Of Breath    ??  . Omnipaque [Iohexol] Shortness Of Breath and Other (See Comments)    Short of breath with chest tightness after IV injection in CT, pt was fine when she left department but developed symptoms in parking lot and went to emergency department.  . Sulfa Antibiotics Shortness Of Breath  . Carbamazepine     REACTION: toxemia  . Dilaudid [Hydromorphone] Nausea And  Vomiting  . Tramadol Other (See Comments)    confusion    Current Outpatient Medications  Medication Sig Dispense Refill  . acetaminophen (TYLENOL) 500 MG tablet Take 500 mg by mouth every 6 (six) hours as needed for mild pain. For pain     . albuterol (PROVENTIL HFA;VENTOLIN HFA) 108 (90 Base) MCG/ACT inhaler Inhale 2 puffs into the lungs every 6 (six) hours as needed for wheezing or shortness of breath.    Marland Kitchen albuterol (PROVENTIL) (2.5 MG/3ML) 0.083% nebulizer solution Take 2.5 mg by nebulization every 6 (six) hours as needed for wheezing or shortness of breath.     Marland Kitchen alendronate (FOSAMAX) 70 MG tablet Take 70 mg by mouth every Wednesday. Take with a full  glass of water on an empty stomach.     Marland Kitchen aspirin (ASPIRIN EC) 81 MG EC tablet Take 1 tablet (81 mg total) by mouth daily. Swallow whole.    Marland Kitchen atorvastatin (LIPITOR) 40 MG tablet Take 40 mg by mouth. Monday, Wednesday, & Friday evening.    . calcium citrate-vitamin D 500-400 MG-UNIT chewable tablet Chew 1 tablet by mouth 2 (two) times daily.    . digoxin (LANOXIN) 0.125 MG tablet Take 1 tablet (0.125 mg total) by mouth every evening. 30 tablet 2  . diltiazem (CARDIZEM) 30 MG tablet Take 1 tablet (30 mg total) by mouth every 6 (six) hours. 120 tablet 2  . docusate sodium (COLACE) 100 MG capsule Take 100 mg by mouth at bedtime.    . fluconazole (DIFLUCAN) 100 MG tablet Take 100 mg by mouth daily.    . fluticasone (FLONASE) 50 MCG/ACT nasal spray Place 2 sprays into both nostrils daily.    . furosemide (LASIX) 20 MG tablet Take 10 mg by mouth 2 (two) times daily.     Marland Kitchen guaiFENesin (MUCINEX) 600 MG 12 hr tablet Take 2 tablets (1,200 mg total) by mouth 2 (two) times daily.    . isosorbide mononitrate (IMDUR) 30 MG 24 hr tablet Take 0.5 tablets (15 mg total) by mouth daily. 30 tablet 0  . levETIRAcetam (KEPPRA) 250 MG tablet Take 125-250 mg by mouth See admin instructions. Takes one tablet in the morning and half a tablet at 3 pm.    . levothyroxine (SYNTHROID, LEVOTHROID) 25 MCG tablet Take 25 mcg by mouth daily before breakfast.    . Melatonin 3 MG TABS Take 1 tablet by mouth at bedtime.    . meloxicam (MOBIC) 15 MG tablet     . Menthol-Methyl Salicylate (MUSCLE RUB) 10-15 % CREA Apply 1 application topically as needed for muscle pain.  0  . midodrine (PROAMATINE) 2.5 MG tablet Take 1 tablet (2.5 mg total) by mouth 2 (two) times daily with a meal. The dosing can be titrated down to once daily when her systolic blood pressure is consistently in the 100s.    Benson Setting Oil-Oleth-Polysorbate (MIN-O-EAR) LIQD Place 2 drops in ear(s) every Monday. At bedtime for stopped up ears    . mometasone (ELOCON) 0.1  % cream Apply 1 application topically daily. To ears for eczema    . montelukast (SINGULAIR) 10 MG tablet Take 10 mg by mouth at bedtime.   10  . Multiple Vitamins-Minerals (ICAPS) CAPS Take 1 capsule by mouth 2 (two) times daily.    . nitroGLYCERIN (NITROSTAT) 0.4 MG SL tablet Place 0.4 mg under the tongue every 5 (five) minutes as needed for chest pain.    . pantoprazole (PROTONIX) 40 MG tablet Take 1 tablet (40 mg  total) by mouth daily. 30 tablet 11  . potassium chloride (K-DUR,KLOR-CON) 10 MEQ tablet     . potassium chloride (KLOR-CON) 10 MEQ CR tablet Take 10 mEq by mouth daily.      . Pramox-PE-Glycerin-Petrolatum (PREPARATION H) 1-0.25-14.4-15 % CREA Use daily prn (Patient taking differently: Place 1 application rectally daily as needed (for hemorrhoids). Use daily prn)  0  . prednisoLONE acetate (PRED FORTE) 1 % ophthalmic suspension     . Probiotic Product (PROBIOTIC DAILY PO) Take 1 tablet by mouth every morning.    . topiramate (TOPAMAX) 25 MG tablet Take 1 tablet by mouth daily.  9  . TRELEGY ELLIPTA 100-62.5-25 MCG/INH AEPB Inhale 1 puff into the lungs daily.  9   No current facility-administered medications for this visit.      Physical Exam  Blood pressure 90/69, pulse (!) 112, height  (1.651 m), weight 156 lb (70.8 kg).  Constitutional: overall normal hygiene, normal nutrition, well developed, normal grooming, normal body habitus. Assistive device:walker  Musculoskeletal: gait and station Limp none, muscle tone and strength are normal, no tremors or atrophy is present.  .  Neurological: coordination overall normal.  Deep tendon reflex/nerve stretch intact.  Sensation normal.  Cranial nerves II-XII intact.   Skin:   Normal overall no scars, lesions, ulcers or rashes. No psoriasis.  Psychiatric: Alert and oriented x 3.  Recent memory intact, remote memory unclear.  Normal mood and affect. Well groomed.  Good eye contact.  Cardiovascular: overall no swelling, no  varicosities, no edema bilaterally, normal temperatures of the legs and arms, no clubbing, cyanosis and good capillary refill.  Lymphatic: palpation is normal.  Right shoulder has full motion but tender in the extremes. Tender area upper trapezius with no spasm.  No effusion is present.  Grips normal.  NV intact.  Back without pain.  NV intact.  Muscle tone and strength are normal.  All other systems reviewed and are negative   The patient has been educated about the nature of the problem(s) and counseled on treatment options.  The patient appeared to understand what I have discussed and is in agreement with it.  Encounter Diagnoses  Name Primary?  . Chronic left shoulder pain Yes  . Compression fracture of T6 vertebra, initial encounter [facility])     PLAN Call if any problems.  Precautions discussed.  Continue current medications.   Return to clinic prn   Forms for rest come completed.  Electronically Signed Darreld Mclean, MD 3/11/20209:58 AM

## 2019-01-29 ENCOUNTER — Telehealth: Payer: Self-pay | Admitting: Orthopaedic Surgery

## 2019-01-29 NOTE — Telephone Encounter (Signed)
Call received from [place], [place], Nanine Means at [facility] - direct ph# [phone] / fax# [phone] - relays patient had a fall and injured right shoulder. Mobile Xray was done, on 01/28/19 (holiday). Copy received in fax: - impression reads"acute moderately displaced oblique fracture of the distal right clavicle". Offered appointment, scheduling for next available. Aware of appointment and of requesting film.

## 2019-02-05 ENCOUNTER — Ambulatory Visit (INDEPENDENT_AMBULATORY_CARE_PROVIDER_SITE_OTHER): Payer: Medicare Other | Admitting: Orthopaedic Surgery

## 2019-02-05 ENCOUNTER — Encounter: Payer: Self-pay | Admitting: Orthopaedic Surgery

## 2019-02-05 ENCOUNTER — Other Ambulatory Visit: Payer: Self-pay

## 2019-02-05 VITALS — BP 108/79 | HR 102 | Ht 65.0 in | Wt 155.0 lb

## 2019-02-05 DIAGNOSIS — S42001A Fracture of unspecified part of right clavicle, initial encounter for closed fracture: Secondary | ICD-10-CM

## 2019-02-05 NOTE — Progress Notes (Signed)
Patient Deborah [name], female DOB:05-20-1926, 85 y.o. ZES:[id number]  Chief Complaint  Patient presents with  . Shoulder Pain    has had pain right shoulder but feels no pain today, much better     HPI  Deborah [name] is a 85 y.o. female who is a resident at [facility].  She fell and hurt her right shoulder.  X-rays there showed a mid clavicle fracture.  She has bruising and only very slight pain.  She can move her shoulder well.  She has no other injury.    I have reviewed the x-ray report.   Body mass index is 25.79 kg/m.  ROS  Review of Systems  Constitutional: Positive for activity change.  Eyes: Positive for visual disturbance.  Respiratory: Positive for shortness of breath. Negative for cough.   Musculoskeletal: Positive for arthralgias, back pain, gait problem and joint swelling.  All other systems reviewed and are negative.   All other systems reviewed and are negative.  The following is a summary of the past history medically, past history surgically, known current medicines, social history and family history.  This information is gathered electronically by the computer from prior information and documentation.  I review this each visit and have found including this information at this point in the chart is beneficial and informative.    Past Medical History:  Diagnosis Date  . Alzheimer disease ([place])   . Asthma   . Atrial fibrillation ([place])    Permanent  . Chronic anticoagulation    Xarelto discontinued after fall  . Chronic diastolic heart failure ([place])   . COPD (chronic obstructive pulmonary disease) ([place])   . Coronary atherosclerosis of native coronary artery    BMS circumflex 2009  . Dementia without behavioral disturbance ([place])   . Drug intolerance    Flecacinide and Amiodarone   . Fracture of multiple pubic rami ([place]) 12/13   Left superior and inferior - following fall  . Hypotension   . Mixed hyperlipidemia   . Osteoarthritis   . Retroperitoneal  hemorrhage 12/13   Following fall  . Seizures ([place])    Remote history of over 20 years ago  . Umbilical hernia     Past Surgical History:  Procedure Laterality Date  . CATARACT EXTRACTION    . Radiofrequency catheter ablation     Failed  . Right carpel tunnel release    . TONSILLECTOMY      Family History  Problem Relation Age of Onset  . Coronary artery disease Mother   . Tuberculosis Paternal Grandfather   . Heart attack Maternal Grandmother     Social History Social History   Tobacco Use  . Smoking status: Former Smoker    Packs/day: 0.80    Years: 25.00    Pack years: 20.00    Types: Cigarettes    Quit date: 05/23/1990    Years since quitting: 28.7  . Smokeless tobacco: Never Used  Substance Use Topics  . Alcohol use: No    Alcohol/week: 0.0 standard drinks  . Drug use: No    Allergies  Allergen Reactions  . Ivp Dye [Iodinated Diagnostic Agents] Shortness Of Breath    ??  . Omnipaque [Iohexol] Shortness Of Breath and Other (See Comments)    Short of breath with chest tightness after IV injection in CT, pt was fine when she left department but developed symptoms in parking lot and went to emergency department.  . Sulfa Antibiotics Shortness Of Breath  . Carbamazepine  REACTION: toxemia  . Dilaudid [Hydromorphone] Nausea And Vomiting  . Tramadol Other (See Comments)    confusion    Current Outpatient Medications  Medication Sig Dispense Refill  . acetaminophen (TYLENOL) 500 MG tablet Take 500 mg by mouth every 6 (six) hours as needed for mild pain. For pain     . alendronate (FOSAMAX) 70 MG tablet Take 70 mg by mouth every Wednesday. Take with a full glass of water on an empty stomach.     Marland Kitchen aspirin (ASPIRIN EC) 81 MG EC tablet Take 1 tablet (81 mg total) by mouth daily. Swallow whole.    . calcium citrate-vitamin D 500-400 MG-UNIT chewable tablet Chew 1 tablet by mouth 2 (two) times daily.    . digoxin (LANOXIN) 0.125 MG tablet Take 1 tablet (0.125 mg  total) by mouth every evening. 30 tablet 2  . diltiazem (CARDIZEM) 30 MG tablet Take 1 tablet (30 mg total) by mouth every 6 (six) hours. 120 tablet 2  . docusate sodium (COLACE) 100 MG capsule Take 100 mg by mouth at bedtime.    . fluconazole (DIFLUCAN) 100 MG tablet Take 100 mg by mouth daily.    . fluticasone (FLONASE) 50 MCG/ACT nasal spray Place 2 sprays into both nostrils daily.    . furosemide (LASIX) 20 MG tablet Take 10 mg by mouth 2 (two) times daily.     Marland Kitchen guaiFENesin (MUCINEX) 600 MG 12 hr tablet Take 2 tablets (1,200 mg total) by mouth 2 (two) times daily.    . isosorbide mononitrate (IMDUR) 30 MG 24 hr tablet Take 0.5 tablets (15 mg total) by mouth daily. 30 tablet 0  . levETIRAcetam (KEPPRA) 250 MG tablet Take 125-250 mg by mouth See admin instructions. Takes one tablet in the morning and half a tablet at 3 pm.    . levothyroxine (SYNTHROID, LEVOTHROID) 25 MCG tablet Take 25 mcg by mouth daily before breakfast.    . Melatonin 3 MG TABS Take 1 tablet by mouth at bedtime.    . meloxicam (MOBIC) 15 MG tablet     . Menthol-Methyl Salicylate (MUSCLE RUB) 10-15 % CREA Apply 1 application topically as needed for muscle pain.  0  . midodrine (PROAMATINE) 2.5 MG tablet Take 1 tablet (2.5 mg total) by mouth 2 (two) times daily with a meal. The dosing can be titrated down to once daily when her systolic blood pressure is consistently in the 100s.    Benson Setting Oil-Oleth-Polysorbate (MIN-O-EAR) LIQD Place 2 drops in ear(s) every Monday. At bedtime for stopped up ears    . mometasone (ELOCON) 0.1 % cream Apply 1 application topically daily. To ears for eczema    . montelukast (SINGULAIR) 10 MG tablet Take 10 mg by mouth at bedtime.   10  . Multiple Vitamins-Minerals (ICAPS) CAPS Take 1 capsule by mouth 2 (two) times daily.    . nitroGLYCERIN (NITROSTAT) 0.4 MG SL tablet Place 0.4 mg under the tongue every 5 (five) minutes as needed for chest pain.    . pantoprazole (PROTONIX) 40 MG tablet Take 1  tablet (40 mg total) by mouth daily. 30 tablet 11  . potassium chloride (K-DUR,KLOR-CON) 10 MEQ tablet     . potassium chloride (KLOR-CON) 10 MEQ CR tablet Take 10 mEq by mouth daily.      . prednisoLONE acetate (PRED FORTE) 1 % ophthalmic suspension     . topiramate (TOPAMAX) 25 MG tablet Take 1 tablet by mouth daily.  9  . TRELEGY ELLIPTA 100-62.5-25 MCG/INH  AEPB Inhale 1 puff into the lungs daily.  9   No current facility-administered medications for this visit.      Physical Exam  Blood pressure 108/79, pulse (!) 102, height 5\' 5"  (1.651 m), weight 155 lb (70.3 kg).  Constitutional: overall normal hygiene, normal nutrition, well developed, normal grooming, normal body habitus. Assistive device:none  Musculoskeletal: gait and station Limp none, muscle tone and strength are normal, no tremors or atrophy is present.  .  Neurological: coordination overall normal.  Deep tendon reflex/nerve stretch intact.  Sensation normal.  Cranial nerves II-XII intact.   Skin:   Normal overall no scars, lesions, ulcers or rashes. No psoriasis.  Psychiatric: Alert and oriented x 3.  Recent memory intact, remote memory unclear.  Normal mood and affect. Well groomed.  Good eye contact.  Cardiovascular: overall no swelling, no varicosities, no edema bilaterally, normal temperatures of the legs and arms, no clubbing, cyanosis and good capillary refill.  Lymphatic: palpation is normal.  Right shoulder with anterior ecchymosis and of the right upper arm. She has tenderness of the mid clavicle but no crepitus. ROM of the right shoulder is amazingly pain free.  NV intact.  All other systems reviewed and are negative   The patient has been educated about the nature of the problem(s) and counseled on treatment options.  The patient appeared to understand what I have discussed and is in agreement with it.  Encounter Diagnosis  Name Primary?  . Closed fracture of interligamentous part of right clavicle,  initial encounter Yes    PLAN Call if any problems.  Precautions discussed.  Continue current medications.   Return to clinic 3 weeks   [facility] here, a few days earlier.  Electronically Signed Darreld McleanWayne Manahil Vanzile, MD 9/15/202012:07 PM

## 2019-02-26 ENCOUNTER — Ambulatory Visit: Payer: Medicare Other | Admitting: Orthopaedic Surgery

## 2019-02-27 ENCOUNTER — Other Ambulatory Visit: Payer: Self-pay

## 2019-02-27 ENCOUNTER — Encounter: Payer: Self-pay | Admitting: Orthopaedic Surgery

## 2019-02-27 ENCOUNTER — Ambulatory Visit (INDEPENDENT_AMBULATORY_CARE_PROVIDER_SITE_OTHER): Payer: Medicare Other | Admitting: Orthopaedic Surgery

## 2019-02-27 DIAGNOSIS — S42001A Fracture of unspecified part of right clavicle, initial encounter for closed fracture: Secondary | ICD-10-CM

## 2019-02-27 NOTE — Progress Notes (Signed)
Virtual Visit via Telephone Note  I connected with@ on 02/27/19 at  9:00 AM EDT by telephone and verified that I am speaking with the correct person using two identifiers.  Location: Patient: nursing home Provider: home   I discussed the limitations, risks, security and privacy concerns of performing an evaluation and management service by telephone and the availability of in person appointments. I also discussed with the patient that there may be a patient responsible charge related to this service. The patient expressed understanding and agreed to proceed.   History of Present Illness: She is doing well.  I spoke to the head nurse.  X-rays done recently showed no change in the fracture.  She has no pain.  She is using her arm well.  She has no swelling, no redness, no numbness.   Observations/Objective: Per above.  Assessment and Plan: Encounter Diagnosis  Name Primary?  . Closed fracture of interligamentous part of right clavicle, initial encounter Yes     Follow Up Instructions: I will do telehealth visit in three weeks.  Get x-rays of the right clavicle just prior to the visit at the nursing home.   I discussed the assessment and treatment plan with the patient. The patient was provided an opportunity to ask questions and all were answered. The patient agreed with the plan and demonstrated an understanding of the instructions.   The patient was advised to call back or seek an in-person evaluation if the symptoms worsen or if the condition fails to improve as anticipated.  I provided 5 minutes of non-face-to-face time during this encounter.   Sanjuana Kava, MD

## 2019-03-20 ENCOUNTER — Other Ambulatory Visit: Payer: Self-pay

## 2019-03-20 ENCOUNTER — Ambulatory Visit: Payer: Medicare Other | Admitting: Orthopaedic Surgery

## 2019-06-12 ENCOUNTER — Encounter: Payer: Self-pay | Admitting: Cardiology

## 2019-06-12 NOTE — Progress Notes (Signed)
Virtual Visit via Telephone Note   This visit type was conducted due to national recommendations for restrictions regarding the COVID-19 Pandemic (e.g. social distancing) in an effort to limit this patient's exposure and mitigate transmission in our community.  Due to her co-morbid illnesses, this patient is at least at moderate risk for complications without adequate follow up.  This format is felt to be most appropriate for this patient at this time.  The patient did not have access to video technology/had technical difficulties with video requiring transitioning to audio format only (telephone).  All issues noted in this document were discussed and addressed.  No physical exam could be performed with this format.  Please refer to the patient's chart for her  consent to telehealth for [facility].    Date:  06/13/2019   ID:  NOVALYNN BRANAMAN, DOB Feb 16, 1926, MRN [id number]  Patient Location: Skilled Nursing Facility Provider Location: Home  PCP:  Marlowe Aschoff, FNP  Cardiologist:  Nona Dell, MD Electrophysiologist:  None   Evaluation Performed:  Follow-Up Visit  Chief Complaint:  Cardiac follow-up  History of Present Illness:    Deborah [name] is a 86 y.o. female last seen in August 2019.  I spoke with Marcelino Duster by phone today.  Patient is a resident of [facility], now in the memory care unit in light of progressive dementia.  Marcelino Duster tells me that Ms. Kunsman is ambulatory and eating normally, no recent change in symptomatology or other concerns.  I reviewed her medications which are outlined below.  Cardiac regimen includes aspirin, Lanoxin, Cardizem, Lasix, Imdur, potassium supplements, ProAmatine, and as needed nitroglycerin.   Past Medical History:  Diagnosis Date  . Alzheimer disease (HCC)   . Asthma   . Atrial fibrillation (HCC)    Not anticoagulated with history of bleeding and fall risk  . Chronic diastolic heart failure (HCC)   . COPD (chronic  obstructive pulmonary disease) (HCC)   . Coronary atherosclerosis of native coronary artery    BMS circumflex 2009  . Dementia without behavioral disturbance (HCC)   . Drug intolerance    Flecacinide and Amiodarone   . Fracture of multiple pubic rami (HCC) 12/13   Left superior and inferior - following fall  . Hypotension   . Mixed hyperlipidemia   . Osteoarthritis   . Retroperitoneal hemorrhage 12/13   Following fall  . Seizures (HCC)    Remote history of over 20 years ago  . Umbilical hernia    Past Surgical History:  Procedure Laterality Date  . CATARACT EXTRACTION    . Radiofrequency catheter ablation     Failed  . Right carpel tunnel release    . TONSILLECTOMY       Current Meds  Medication Sig  . acetaminophen (TYLENOL) 500 MG tablet Take 500 mg by mouth every 6 (six) hours as needed for mild pain. For pain   . alendronate (FOSAMAX) 70 MG tablet Take 70 mg by mouth every Wednesday. Take with a full glass of water on an empty stomach.   . ALPRAZolam (XANAX) 0.25 MG tablet Take 0.25 mg by mouth 3 (three) times daily as needed for anxiety.  Marland Kitchen aspirin (ASPIRIN EC) 81 MG EC tablet Take 1 tablet (81 mg total) by mouth daily. Swallow whole.  . calcium citrate-vitamin D 500-400 MG-UNIT chewable tablet Chew 1 tablet by mouth 2 (two) times daily.  . digoxin (LANOXIN) 0.125 MG tablet Take 1 tablet (0.125 mg total) by mouth every evening.  Marland Kitchen  diltiazem (CARDIZEM) 30 MG tablet Take 1 tablet (30 mg total) by mouth every 6 (six) hours.  . docusate sodium (COLACE) 100 MG capsule Take 100 mg by mouth at bedtime.  . fluticasone (FLONASE) 50 MCG/ACT nasal spray Place 2 sprays into both nostrils daily.  . furosemide (LASIX) 20 MG tablet Take 10 mg by mouth 2 (two) times daily.   Marland Kitchen gabapentin (NEURONTIN) 300 MG capsule Take 300 mg by mouth at bedtime.  Marland Kitchen guaiFENesin (MUCINEX) 600 MG 12 hr tablet Take 2 tablets (1,200 mg total) by mouth 2 (two) times daily.  . isosorbide mononitrate (IMDUR) 30  MG 24 hr tablet Take 0.5 tablets (15 mg total) by mouth daily.  Marland Kitchen levETIRAcetam (KEPPRA) 250 MG tablet Take 125-250 mg by mouth See admin instructions. Takes one tablet in the morning and half a tablet at 3 pm.  . levothyroxine (SYNTHROID, LEVOTHROID) 25 MCG tablet Take 25 mcg by mouth daily before breakfast.  . Melatonin 3 MG TABS Take 1 tablet by mouth at bedtime.  . meloxicam (MOBIC) 15 MG tablet   . midodrine (PROAMATINE) 2.5 MG tablet Take 1 tablet (2.5 mg total) by mouth 2 (two) times daily with a meal. The dosing can be titrated down to once daily when her systolic blood pressure is consistently in the 100s.  [place]dell Face Oil-Oleth-Polysorbate (MIN-O-EAR) LIQD Place 2 drops in ear(s) every Monday. At bedtime for stopped up ears  . mometasone (ELOCON) 0.1 % cream Apply 1 application topically daily. To ears for eczema  . montelukast (SINGULAIR) 10 MG tablet Take 10 mg by mouth at bedtime.   . Multiple Vitamins-Minerals (ICAPS) CAPS Take 1 capsule by mouth 2 (two) times daily.  . nitroGLYCERIN (NITROSTAT) 0.4 MG SL tablet Place 0.4 mg under the tongue every 5 (five) minutes as needed for chest pain.  . pantoprazole (PROTONIX) 40 MG tablet Take 1 tablet (40 mg total) by mouth daily.  . potassium chloride (K-DUR,KLOR-CON) 10 MEQ tablet 10 mEq daily.   . potassium chloride (KLOR-CON) 10 MEQ CR tablet Take 10 mEq by mouth daily.    . prednisoLONE acetate (PRED FORTE) 1 % ophthalmic suspension   . sertraline (ZOLOFT) 25 MG tablet Take 25 mg by mouth daily.  Marland Kitchen topiramate (TOPAMAX) 25 MG tablet Take 1 tablet by mouth daily.  . TRELEGY ELLIPTA 100-62.5-25 MCG/INH AEPB Inhale 1 puff into the lungs daily.  . [DISCONTINUED] Menthol-Methyl Salicylate (MUSCLE RUB) 10-15 % CREA Apply 1 application topically as needed for muscle pain.     Allergies:   Ivp dye [iodinated diagnostic agents], Omnipaque [iohexol], Sulfa antibiotics, Carbamazepine, Dilaudid [hydromorphone], and Tramadol   Social History    Tobacco Use  . Smoking status: Former Smoker    Packs/day: 0.80    Years: 25.00    Pack years: 20.00    Types: Cigarettes    Quit date: 05/23/1990    Years since quitting: 29.0  . Smokeless tobacco: Never Used  Substance Use Topics  . Alcohol use: No    Alcohol/week: 0.0 standard drinks  . Drug use: No     Family Hx: The patient's family history includes Coronary artery disease in her mother; Heart attack in her maternal grandmother; Tuberculosis in her paternal grandfather.  ROS:   Please see the history of present illness.    I did not communicate with the patient during this encounter.   Prior CV studies:   The following studies were reviewed today:  Echocardiogram 09/02/2017: Study Conclusions  - Left ventricle: The  cavity size was normal. Wall thickness was normal. Systolic function was normal. The estimated ejection fraction was in the range of 60% to 65%. Wall motion was normal; there were no regional wall motion abnormalities. - Mitral valve: Calcified annulus. - Left atrium: The atrium was mildly dilated. - Right atrium: The atrium was mildly dilated. - Tricuspid valve: There was mild-moderate regurgitation directed centrally. - Pulmonary arteries: Systolic pressure was mildly increased. PA peak pressure: 35 mm Hg (S).  Labs/Other Tests and Data Reviewed:    EKG:  An ECG dated 09/14/2017 was personally reviewed today and demonstrated:  Atrial fibrillation with nonspecific ST changes.  Recent Labs:  No interval lab work for review today.  Wt Readings from Last 3 Encounters:  06/13/19 149 lb (67.6 kg)  02/05/19 155 lb (70.3 kg)  08/01/18 156 lb (70.8 kg)     Objective:    Vital Signs:  BP 130/70   Pulse 76   Resp 20   Ht 5\' 4"  (1.626 m)   Wt 149 lb (67.6 kg)   BMI 25.58 kg/m    I communicated with at [place] during the encounter.  ASSESSMENT & PLAN:    1.  Permanent atrial fibrillation.  She is not anticoagulated based  on prior discussions, continues on low-dose aspirin as well as Cardizem.  Recent vital signs indicate adequate heart rate control.  2.  CAD with history of BMS to the circumflex in 2009.  This is being followed conservatively at this point in the absence of accelerating angina.  She remains on aspirin, Cardizem, and Imdur.  3.  Hypotension, blood pressure stable on ProAmatine.  COVID-19 Education: The signs and symptoms of COVID-19 were discussed with the patient and how to seek care for testing (follow up with PCP or arrange E-visit).  The importance of social distancing was discussed today.  Time:   Today, I have spent 7 minutes with the patient with telehealth technology discussing the above problems.     Medication Adjustments/Labs and Tests Ordered: Current medicines are reviewed at length with the patient today.  Concerns regarding medicines are outlined above.   Tests Ordered: No orders of the defined types were placed in this encounter.   Medication Changes: No orders of the defined types were placed in this encounter.   Follow Up:  As needed   Signed, 2010, MD  06/13/2019 9:28 AM    [facility]

## 2019-06-13 ENCOUNTER — Encounter: Payer: Self-pay | Admitting: Cardiology

## 2019-06-13 ENCOUNTER — Telehealth: Payer: Self-pay

## 2019-06-13 ENCOUNTER — Telehealth (INDEPENDENT_AMBULATORY_CARE_PROVIDER_SITE_OTHER): Payer: Medicare Other | Admitting: Cardiology

## 2019-06-13 VITALS — BP 130/70 | HR 76 | Resp 20 | Ht 64.0 in | Wt 149.0 lb

## 2019-06-13 DIAGNOSIS — I25119 Atherosclerotic heart disease of native coronary artery with unspecified angina pectoris: Secondary | ICD-10-CM

## 2019-06-13 DIAGNOSIS — I4821 Permanent atrial fibrillation: Secondary | ICD-10-CM

## 2019-06-13 DIAGNOSIS — E782 Mixed hyperlipidemia: Secondary | ICD-10-CM

## 2019-06-13 NOTE — Telephone Encounter (Signed)
Attempt to reach daughter, Darl Pikes, to get consent  For televisit, [id number]

## 2019-06-13 NOTE — Patient Instructions (Signed)
Medication Instructions:  Your physician recommends that you continue on your current medications as directed. Please refer to the Current Medication list given to you today.  *If you need a refill on your cardiac medications before your next appointment, please call your pharmacy*  Lab Work: NONE If you have labs (blood work) drawn today and your tests are completely normal, you will receive your results only by: Marland Kitchen MyChart Message (if you have MyChart) OR . A paper copy in the mail If you have any lab test that is abnormal or we need to change your treatment, we will call you to review the results.  Testing/Procedures: NONE  Follow-Up:   As needed      Thank you for choosing [facility] !

## 2019-06-13 NOTE — Telephone Encounter (Signed)
Virtual Visit Pre-Appointment Phone Call  "(Name), I am calling you today to discuss your upcoming appointment. We are currently trying to limit exposure to the virus that causes COVID-19 by seeing patients at home rather than in the office."  1. "What is the BEST phone number to call the day of the visit?" - include this in appointment notes  2. "Do you have or have access to (through a family member/friend) a smartphone with video capability that we can use for your visit?" a. If yes - list this number in appt notes as "cell" (if different from BEST phone #) and list the appointment type as a VIDEO visit in appointment notes b. If no - list the appointment type as a PHONE visit in appointment notes  3. Confirm consent - "In the setting of the current Covid19 crisis, you are scheduled for a (phone or video) visit with your provider on (date) at (time).  Just as we do with many in-office visits, in order for you to participate in this visit, we must obtain consent.  If you'd like, I can send this to your mychart (if signed up) or email for you to review.  Otherwise, I can obtain your verbal consent now.  All virtual visits are billed to your insurance company just like a normal visit would be.  By agreeing to a virtual visit, we'd like you to understand that the technology does not allow for your provider to perform an examination, and thus may limit your provider's ability to fully assess your condition. If your provider identifies any concerns that need to be evaluated in person, we will make arrangements to do so.  Finally, though the technology is pretty good, we cannot assure that it will always work on either your or our end, and in the setting of a video visit, we may have to convert it to a phone-only visit.  In either situation, we cannot ensure that we have a secure connection.  Are you willing to proceed?" STAFF: Did the patient verbally acknowledge consent to telehealth visit? Document  YES/NO here: YES  4. Advise patient to be prepared - "Two hours prior to your appointment, go ahead and check your blood pressure, pulse, oxygen saturation, and your weight (if you have the equipment to check those) and write them all down. When your visit starts, your provider will ask you for this information. If you have an Apple Watch or Kardia device, please plan to have heart rate information ready on the day of your appointment. Please have a pen and paper handy nearby the day of the visit as well."  5. Give patient instructions for MyChart download to smartphone OR Doximity/Doxy.me as below if video visit (depending on what platform provider is using)  6. Inform patient they will receive a phone call 15 minutes prior to their appointment time (may be from unknown caller ID) so they should be prepared to answer    TELEPHONE CALL NOTE  Deborah [name] has been deemed a candidate for a follow-up tele-health visit to limit community exposure during the Covid-19 pandemic. I spoke with the patient via phone to ensure availability of phone/video source, confirm preferred email & phone number, and discuss instructions and expectations.  I reminded Deborah [name] to be prepared with any vital sign and/or heart rhythm information that could potentially be obtained via home monitoring, at the time of her visit. I reminded Deborah [name] to expect a phone call prior to  her visit.  [name], RN 06/13/2019 9:08 AM   INSTRUCTIONS FOR DOWNLOADING THE MYCHART APP TO SMARTPHONE  - The patient must first make sure to have activated MyChart and know their login information - If Apple, go to [employer] and type in MyChart in the search bar and download the app. If Android, ask patient to go to [employer] and type in [place] in the search bar and download the app. The app is free but as with any other app downloads, their phone may require them to verify saved payment information or  Apple/Android password.  - The patient will need to then log into the app with their MyChart username and password, and select [facility] as their healthcare provider to link the account. When it is time for your visit, go to the MyChart app, find appointments, and click Begin Video Visit. Be sure to Select Allow for your device to access the Microphone and Camera for your visit. You will then be connected, and your provider will be with you shortly.  **If they have any issues connecting, or need assistance please contact MyChart service desk [phone])**  **If using a computer, in order to ensure the best quality for their visit they will need to use either of the following Internet Browsers: [employer] or Google Chrome**  IF USING DOXIMITY or DOXY.ME - The patient will receive a link just prior to their visit by text.     FULL LENGTH CONSENT FOR TELE-HEALTH VISIT   I hereby voluntarily request, consent and authorize [facility] and its employed or contracted physicians, physician assistants, nurse practitioners or other licensed health care professionals (the Practitioner), to provide me with telemedicine health care services (the "Services") as deemed necessary by the treating Practitioner. I acknowledge and consent to receive the Services by the Practitioner via telemedicine. I understand that the telemedicine visit will involve communicating with the Practitioner through live audiovisual communication technology and the disclosure of certain medical information by electronic transmission. I acknowledge that I have been given the opportunity to request an in-person assessment or other available alternative prior to the telemedicine visit and am voluntarily participating in the telemedicine visit.  I understand that I have the right to withhold or withdraw my consent to the use of telemedicine in the course of my care at any time, without affecting my right to future care  or treatment, and that the Practitioner or I may terminate the telemedicine visit at any time. I understand that I have the right to inspect all information obtained and/or recorded in the course of the telemedicine visit and may receive copies of available information for a reasonable fee.  I understand that some of the potential risks of receiving the Services via telemedicine include:  Marland Kitchen Delay or interruption in medical evaluation due to technological equipment failure or disruption; . Information transmitted may not be sufficient (e.g. poor resolution of images) to allow for appropriate medical decision making by the Practitioner; and/or  . In rare instances, security protocols could fail, causing a breach of personal health information.  Furthermore, I acknowledge that it is my responsibility to provide information about my medical history, conditions and care that is complete and accurate to the best of my ability. I acknowledge that Practitioner's advice, recommendations, and/or decision may be based on factors not within their control, such as incomplete or inaccurate data provided by me or distortions of diagnostic images or specimens that may result from electronic transmissions.  I understand that the practice of medicine is not an exact science and that Practitioner makes no warranties or guarantees regarding treatment outcomes. I acknowledge that I will receive a copy of this consent concurrently upon execution via email to the email address I last provided but may also request a printed copy by calling the office of [facility].    I understand that my insurance will be billed for this visit.   I have read or had this consent read to me. . I understand the contents of this consent, which adequately explains the benefits and risks of the Services being provided via telemedicine.  . I have been provided ample opportunity to ask questions regarding this consent and the Services and have had  my questions answered to my satisfaction. . I give my informed consent for the services to be provided through the use of telemedicine in my medical care  By participating in this telemedicine visit I agree to the above.

## 2019-07-17 ENCOUNTER — Emergency Department (HOSPITAL_COMMUNITY): Payer: Medicare Other

## 2019-07-17 ENCOUNTER — Inpatient Hospital Stay (HOSPITAL_COMMUNITY)
Admission: EM | Admit: 2019-07-17 | Discharge: 2019-07-21 | DRG: 871 | Disposition: A | Discharge status: Skilled Nursing Facility | Payer: Medicare Other | Source: Skilled Nursing Facility | Attending: Internal Medicine | Admitting: Internal Medicine

## 2019-07-17 ENCOUNTER — Other Ambulatory Visit: Payer: Self-pay

## 2019-07-17 DIAGNOSIS — R569 Unspecified convulsions: Secondary | ICD-10-CM

## 2019-07-17 DIAGNOSIS — E782 Mixed hyperlipidemia: Secondary | ICD-10-CM | POA: Diagnosis present

## 2019-07-17 DIAGNOSIS — A419 Sepsis, unspecified organism: Principal | ICD-10-CM

## 2019-07-17 DIAGNOSIS — Z791 Long term (current) use of non-steroidal anti-inflammatories (NSAID): Secondary | ICD-10-CM

## 2019-07-17 DIAGNOSIS — Z955 Presence of coronary angioplasty implant and graft: Secondary | ICD-10-CM | POA: Diagnosis not present

## 2019-07-17 DIAGNOSIS — A0839 Other viral enteritis: Secondary | ICD-10-CM | POA: Diagnosis present

## 2019-07-17 DIAGNOSIS — N390 Urinary tract infection, site not specified: Secondary | ICD-10-CM | POA: Diagnosis present

## 2019-07-17 DIAGNOSIS — Z87891 Personal history of nicotine dependence: Secondary | ICD-10-CM | POA: Diagnosis not present

## 2019-07-17 DIAGNOSIS — Z79899 Other long term (current) drug therapy: Secondary | ICD-10-CM | POA: Diagnosis not present

## 2019-07-17 DIAGNOSIS — N179 Acute kidney failure, unspecified: Secondary | ICD-10-CM | POA: Diagnosis not present

## 2019-07-17 DIAGNOSIS — Z7983 Long term (current) use of bisphosphonates: Secondary | ICD-10-CM | POA: Diagnosis not present

## 2019-07-17 DIAGNOSIS — I2699 Other pulmonary embolism without acute cor pulmonale: Secondary | ICD-10-CM

## 2019-07-17 DIAGNOSIS — R6520 Severe sepsis without septic shock: Secondary | ICD-10-CM | POA: Diagnosis not present

## 2019-07-17 DIAGNOSIS — N3000 Acute cystitis without hematuria: Secondary | ICD-10-CM | POA: Diagnosis not present

## 2019-07-17 DIAGNOSIS — I5032 Chronic diastolic (congestive) heart failure: Secondary | ICD-10-CM | POA: Diagnosis not present

## 2019-07-17 DIAGNOSIS — I5042 Chronic combined systolic (congestive) and diastolic (congestive) heart failure: Secondary | ICD-10-CM | POA: Diagnosis present

## 2019-07-17 DIAGNOSIS — Z9861 Coronary angioplasty status: Secondary | ICD-10-CM

## 2019-07-17 DIAGNOSIS — J449 Chronic obstructive pulmonary disease, unspecified: Secondary | ICD-10-CM | POA: Diagnosis present

## 2019-07-17 DIAGNOSIS — G308 Other Alzheimer's disease: Secondary | ICD-10-CM | POA: Diagnosis not present

## 2019-07-17 DIAGNOSIS — Z885 Allergy status to narcotic agent status: Secondary | ICD-10-CM

## 2019-07-17 DIAGNOSIS — G40909 Epilepsy, unspecified, not intractable, without status epilepticus: Secondary | ICD-10-CM | POA: Diagnosis present

## 2019-07-17 DIAGNOSIS — B948 Sequelae of other specified infectious and parasitic diseases: Secondary | ICD-10-CM | POA: Diagnosis not present

## 2019-07-17 DIAGNOSIS — Z8249 Family history of ischemic heart disease and other diseases of the circulatory system: Secondary | ICD-10-CM | POA: Diagnosis not present

## 2019-07-17 DIAGNOSIS — E039 Hypothyroidism, unspecified: Secondary | ICD-10-CM | POA: Diagnosis present

## 2019-07-17 DIAGNOSIS — I2693 Single subsegmental pulmonary embolism without acute cor pulmonale: Secondary | ICD-10-CM | POA: Diagnosis not present

## 2019-07-17 DIAGNOSIS — F0280 Dementia in other diseases classified elsewhere without behavioral disturbance: Secondary | ICD-10-CM | POA: Diagnosis present

## 2019-07-17 DIAGNOSIS — I2510 Atherosclerotic heart disease of native coronary artery without angina pectoris: Secondary | ICD-10-CM | POA: Diagnosis present

## 2019-07-17 DIAGNOSIS — Z66 Do not resuscitate: Secondary | ICD-10-CM | POA: Diagnosis present

## 2019-07-17 DIAGNOSIS — I959 Hypotension, unspecified: Secondary | ICD-10-CM | POA: Diagnosis present

## 2019-07-17 DIAGNOSIS — M1990 Unspecified osteoarthritis, unspecified site: Secondary | ICD-10-CM | POA: Diagnosis present

## 2019-07-17 DIAGNOSIS — Z882 Allergy status to sulfonamides status: Secondary | ICD-10-CM

## 2019-07-17 DIAGNOSIS — G309 Alzheimer's disease, unspecified: Secondary | ICD-10-CM | POA: Diagnosis present

## 2019-07-17 DIAGNOSIS — U071 COVID-19: Secondary | ICD-10-CM | POA: Diagnosis not present

## 2019-07-17 DIAGNOSIS — I110 Hypertensive heart disease with heart failure: Secondary | ICD-10-CM | POA: Diagnosis present

## 2019-07-17 DIAGNOSIS — I4821 Permanent atrial fibrillation: Secondary | ICD-10-CM | POA: Diagnosis not present

## 2019-07-17 DIAGNOSIS — Z888 Allergy status to other drugs, medicaments and biological substances status: Secondary | ICD-10-CM

## 2019-07-17 DIAGNOSIS — Z79890 Hormone replacement therapy (postmenopausal): Secondary | ICD-10-CM | POA: Diagnosis not present

## 2019-07-17 DIAGNOSIS — E861 Hypovolemia: Secondary | ICD-10-CM | POA: Diagnosis present

## 2019-07-17 DIAGNOSIS — Z91041 Radiographic dye allergy status: Secondary | ICD-10-CM

## 2019-07-17 DIAGNOSIS — I5033 Acute on chronic diastolic (congestive) heart failure: Secondary | ICD-10-CM | POA: Diagnosis present

## 2019-07-17 DIAGNOSIS — I350 Nonrheumatic aortic (valve) stenosis: Secondary | ICD-10-CM | POA: Diagnosis not present

## 2019-07-17 DIAGNOSIS — Z7982 Long term (current) use of aspirin: Secondary | ICD-10-CM | POA: Diagnosis not present

## 2019-07-17 DIAGNOSIS — I361 Nonrheumatic tricuspid (valve) insufficiency: Secondary | ICD-10-CM | POA: Diagnosis not present

## 2019-07-17 LAB — COMPREHENSIVE METABOLIC PANEL
ALT: 18 U/L (ref 0–44)
AST: 32 U/L (ref 15–41)
Albumin: 3.8 g/dL (ref 3.5–5.0)
Alkaline Phosphatase: 71 U/L (ref 38–126)
Anion gap: 10 (ref 5–15)
BUN: 29 mg/dL — ABNORMAL HIGH (ref 8–23)
CO2: 27 mmol/L (ref 22–32)
Calcium: 9.1 mg/dL (ref 8.9–10.3)
Chloride: 99 mmol/L (ref 98–111)
Creatinine, Ser: 1.05 mg/dL — ABNORMAL HIGH (ref 0.44–1.00)
GFR calc Af Amer: 54 mL/min — ABNORMAL LOW (ref >60)
GFR calc non Af Amer: 46 mL/min — ABNORMAL LOW (ref >60)
Glucose, Bld: 112 mg/dL — ABNORMAL HIGH (ref 70–99)
Potassium: 4.3 mmol/L (ref 3.5–5.1)
Sodium: 136 mmol/L (ref 135–145)
Total Bilirubin: 0.6 mg/dL (ref 0.3–1.2)
Total Protein: 6.8 g/dL (ref 6.5–8.1)

## 2019-07-17 LAB — RESPIRATORY PANEL BY RT PCR (FLU A&B, COVID)
Influenza A by PCR: NEGATIVE
Influenza B by PCR: NEGATIVE
SARS Coronavirus 2 by RT PCR: POSITIVE — AB

## 2019-07-17 LAB — URINALYSIS, ROUTINE W REFLEX MICROSCOPIC
Bilirubin Urine: NEGATIVE
Glucose, UA: NEGATIVE mg/dL
Hgb urine dipstick: NEGATIVE
Ketones, ur: 5 mg/dL — AB
Nitrite: NEGATIVE
Protein, ur: 30 mg/dL — AB
Specific Gravity, Urine: 1.019 (ref 1.005–1.030)
pH: 5 (ref 5.0–8.0)

## 2019-07-17 LAB — CBC WITH DIFFERENTIAL/PLATELET
Abs Immature Granulocytes: 0.01 10*3/uL (ref 0.00–0.07)
Basophils Absolute: 0 10*3/uL (ref 0.0–0.1)
Basophils Relative: 0 %
Eosinophils Absolute: 0 10*3/uL (ref 0.0–0.5)
Eosinophils Relative: 0 %
HCT: 48.5 % — ABNORMAL HIGH (ref 36.0–46.0)
Hemoglobin: 15.3 g/dL — ABNORMAL HIGH (ref 12.0–15.0)
Immature Granulocytes: 0 %
Lymphocytes Relative: 3 %
Lymphs Abs: 0.2 10*3/uL — ABNORMAL LOW (ref 0.7–4.0)
MCH: 29.2 pg (ref 26.0–34.0)
MCHC: 31.5 g/dL (ref 30.0–36.0)
MCV: 92.6 fL (ref 80.0–100.0)
Monocytes Absolute: 0.3 10*3/uL (ref 0.1–1.0)
Monocytes Relative: 5 %
Neutro Abs: 5.3 10*3/uL (ref 1.7–7.7)
Neutrophils Relative %: 92 %
Platelets: 150 10*3/uL (ref 150–400)
RBC: 5.24 MIL/uL — ABNORMAL HIGH (ref 3.87–5.11)
RDW: 15.3 % (ref 11.5–15.5)
WBC: 5.8 10*3/uL (ref 4.0–10.5)
nRBC: 0 % (ref 0.0–0.2)

## 2019-07-17 LAB — TROPONIN I (HIGH SENSITIVITY): Troponin I (High Sensitivity): 18 ng/L — ABNORMAL HIGH (ref <18)

## 2019-07-17 LAB — LACTIC ACID, PLASMA
Lactic Acid, Venous: 2.1 mmol/L (ref 0.5–1.9)
Lactic Acid, Venous: <0.3 mmol/L — ABNORMAL LOW (ref 0.5–1.9)

## 2019-07-17 LAB — LIPASE, BLOOD: Lipase: 27 U/L (ref 11–51)

## 2019-07-17 LAB — PROTIME-INR
INR: 1.1 (ref 0.8–1.2)
Prothrombin Time: 14.3 seconds (ref 11.4–15.2)

## 2019-07-17 LAB — FERRITIN: Ferritin: 626 ng/mL — ABNORMAL HIGH (ref 11–307)

## 2019-07-17 LAB — APTT: aPTT: 30 seconds (ref 24–36)

## 2019-07-17 LAB — POC SARS CORONAVIRUS 2 AG -  ED: SARS Coronavirus 2 Ag: NEGATIVE

## 2019-07-17 LAB — LACTATE DEHYDROGENASE: LDH: 243 U/L — ABNORMAL HIGH (ref 98–192)

## 2019-07-17 LAB — FIBRINOGEN: Fibrinogen: 399 mg/dL (ref 210–475)

## 2019-07-17 LAB — C-REACTIVE PROTEIN: CRP: 13.8 mg/dL — ABNORMAL HIGH (ref <1.0)

## 2019-07-17 LAB — BRAIN NATRIURETIC PEPTIDE: B Natriuretic Peptide: 217 pg/mL — ABNORMAL HIGH (ref 0.0–100.0)

## 2019-07-17 MED ORDER — METRONIDAZOLE IN NACL 5-0.79 MG/ML-% IV SOLN
500.0000 mg | Freq: Once | INTRAVENOUS | Status: AC
  Administered 2019-07-17: 500 mg via INTRAVENOUS
  Filled 2019-07-17: qty 100

## 2019-07-17 MED ORDER — ONDANSETRON HCL 4 MG PO TABS: 4.0000 mg | ORAL_TABLET | Freq: Four times a day (QID) | ORAL | Status: DC | PRN

## 2019-07-17 MED ORDER — TOPIRAMATE 25 MG PO TABS
25.0000 mg | ORAL_TABLET | Freq: Every day | ORAL | Status: DC | PRN
  Administered 2019-07-18: 25 mg via ORAL
  Filled 2019-07-17: qty 1

## 2019-07-17 MED ORDER — ICAPS PO CAPS: 1.0000 | ORAL_CAPSULE | Freq: Two times a day (BID) | ORAL | Status: DC

## 2019-07-17 MED ORDER — DIGOXIN 0.25 MG/ML IJ SOLN: 0.2500 mg | Freq: Once | INTRAMUSCULAR | Status: DC

## 2019-07-17 MED ORDER — DIGOXIN 125 MCG PO TABS
0.1250 mg | ORAL_TABLET | Freq: Every evening | ORAL | Status: DC
  Administered 2019-07-18 – 2019-07-20 (×4): 0.125 mg via ORAL
  Filled 2019-07-17 (×6): qty 1

## 2019-07-17 MED ORDER — ACETAMINOPHEN 650 MG RE SUPP: 650.0000 mg | Freq: Four times a day (QID) | RECTAL | Status: DC | PRN

## 2019-07-17 MED ORDER — SERTRALINE HCL 50 MG PO TABS
25.0000 mg | ORAL_TABLET | Freq: Every day | ORAL | Status: DC
  Administered 2019-07-18 – 2019-07-21 (×4): 25 mg via ORAL
  Filled 2019-07-17 (×4): qty 1

## 2019-07-17 MED ORDER — CALCIUM CARBONATE-VITAMIN D 500-200 MG-UNIT PO TABS
1.0000 | ORAL_TABLET | Freq: Two times a day (BID) | ORAL | Status: DC
  Administered 2019-07-18 – 2019-07-21 (×7): 1 via ORAL
  Filled 2019-07-17 (×7): qty 1

## 2019-07-17 MED ORDER — SODIUM CHLORIDE 0.9 % IV BOLUS (SEPSIS)
250.0000 mL | Freq: Once | INTRAVENOUS | Status: AC
  Administered 2019-07-17: 250 mL via INTRAVENOUS

## 2019-07-17 MED ORDER — HEPARIN SODIUM (PORCINE) 5000 UNIT/ML IJ SOLN
5000.0000 [IU] | Freq: Three times a day (TID) | INTRAMUSCULAR | Status: DC
  Administered 2019-07-18 – 2019-07-20 (×9): 5000 [IU] via SUBCUTANEOUS
  Filled 2019-07-17 (×9): qty 1

## 2019-07-17 MED ORDER — DOCUSATE SODIUM 100 MG PO CAPS
100.0000 mg | ORAL_CAPSULE | Freq: Every day | ORAL | Status: DC
  Administered 2019-07-18 – 2019-07-19 (×3): 100 mg via ORAL
  Filled 2019-07-17 (×4): qty 1

## 2019-07-17 MED ORDER — ONDANSETRON HCL 4 MG/2ML IJ SOLN
4.0000 mg | Freq: Once | INTRAMUSCULAR | Status: AC
  Administered 2019-07-17: 4 mg via INTRAVENOUS
  Filled 2019-07-17: qty 2

## 2019-07-17 MED ORDER — SODIUM CHLORIDE 0.9 % IV BOLUS
500.0000 mL | Freq: Once | INTRAVENOUS | Status: AC
  Administered 2019-07-17: 500 mL via INTRAVENOUS

## 2019-07-17 MED ORDER — GABAPENTIN 300 MG PO CAPS
300.0000 mg | ORAL_CAPSULE | Freq: Every day | ORAL | Status: DC
  Administered 2019-07-18 – 2019-07-20 (×4): 300 mg via ORAL
  Filled 2019-07-17 (×4): qty 1

## 2019-07-17 MED ORDER — SODIUM CHLORIDE 0.9 % IV SOLN
1.0000 g | INTRAVENOUS | Status: DC
  Administered 2019-07-18 – 2019-07-19 (×2): 1 g via INTRAVENOUS
  Filled 2019-07-17 (×2): qty 10

## 2019-07-17 MED ORDER — POLYETHYLENE GLYCOL 3350 17 G PO PACK: 17.0000 g | PACK | Freq: Every day | ORAL | Status: DC | PRN

## 2019-07-17 MED ORDER — MIDODRINE HCL 5 MG PO TABS
2.5000 mg | ORAL_TABLET | Freq: Two times a day (BID) | ORAL | Status: DC
  Administered 2019-07-18 – 2019-07-21 (×7): 2.5 mg via ORAL
  Filled 2019-07-17 (×10): qty 1

## 2019-07-17 MED ORDER — ASPIRIN EC 81 MG PO TBEC
81.0000 mg | DELAYED_RELEASE_TABLET | Freq: Every day | ORAL | Status: DC
  Administered 2019-07-18 – 2019-07-21 (×4): 81 mg via ORAL
  Filled 2019-07-17 (×7): qty 1

## 2019-07-17 MED ORDER — LEVETIRACETAM 250 MG PO TABS
125.0000 mg | ORAL_TABLET | ORAL | Status: DC
  Administered 2019-07-18 – 2019-07-21 (×4): 125 mg via ORAL
  Filled 2019-07-17 (×4): qty 1

## 2019-07-17 MED ORDER — LEVETIRACETAM 250 MG PO TABS: 125.0000 mg | ORAL_TABLET | ORAL | Status: DC

## 2019-07-17 MED ORDER — GUAIFENESIN ER 600 MG PO TB12
600.0000 mg | ORAL_TABLET | Freq: Every day | ORAL | Status: DC
  Administered 2019-07-18 – 2019-07-21 (×4): 600 mg via ORAL
  Filled 2019-07-17 (×4): qty 1

## 2019-07-17 MED ORDER — SODIUM CHLORIDE 0.9 % IV BOLUS (SEPSIS)
1000.0000 mL | Freq: Once | INTRAVENOUS | Status: AC
  Administered 2019-07-17: 1000 mL via INTRAVENOUS

## 2019-07-17 MED ORDER — ACETAMINOPHEN 325 MG PO TABS
650.0000 mg | ORAL_TABLET | Freq: Four times a day (QID) | ORAL | Status: DC | PRN
  Filled 2019-07-17: qty 2

## 2019-07-17 MED ORDER — LACTATED RINGERS IV SOLN: INTRAVENOUS | Status: DC

## 2019-07-17 MED ORDER — CALCIUM CITRATE-VITAMIN D 500-400 MG-UNIT PO CHEW: 1.0000 | CHEWABLE_TABLET | Freq: Two times a day (BID) | ORAL | Status: DC

## 2019-07-17 MED ORDER — VANCOMYCIN HCL 1250 MG/250ML IV SOLN
1250.0000 mg | Freq: Once | INTRAVENOUS | Status: AC
  Administered 2019-07-17: 1250 mg via INTRAVENOUS
  Filled 2019-07-17: qty 250

## 2019-07-17 MED ORDER — SODIUM CHLORIDE 0.9% FLUSH: 3.0000 mL | INTRAVENOUS | Status: DC | PRN

## 2019-07-17 MED ORDER — LEVETIRACETAM 250 MG PO TABS
250.0000 mg | ORAL_TABLET | Freq: Every day | ORAL | Status: DC
  Administered 2019-07-18 – 2019-07-21 (×4): 250 mg via ORAL
  Filled 2019-07-17 (×4): qty 1

## 2019-07-17 MED ORDER — POTASSIUM CHLORIDE CRYS ER 10 MEQ PO TBCR
10.0000 meq | EXTENDED_RELEASE_TABLET | Freq: Every day | ORAL | Status: DC
  Administered 2019-07-18 – 2019-07-21 (×4): 10 meq via ORAL
  Filled 2019-07-17 (×4): qty 1

## 2019-07-17 MED ORDER — ZINC SULFATE 220 (50 ZN) MG PO CAPS
220.0000 mg | ORAL_CAPSULE | Freq: Every day | ORAL | Status: DC
  Administered 2019-07-18 – 2019-07-21 (×4): 220 mg via ORAL
  Filled 2019-07-17 (×4): qty 1

## 2019-07-17 MED ORDER — BISACODYL 10 MG RE SUPP: 10.0000 mg | Freq: Every day | RECTAL | Status: DC | PRN

## 2019-07-17 MED ORDER — SODIUM CHLORIDE 0.9 % IV SOLN: 250.0000 mL | INTRAVENOUS | Status: DC | PRN

## 2019-07-17 MED ORDER — PANTOPRAZOLE SODIUM 40 MG PO TBEC
40.0000 mg | DELAYED_RELEASE_TABLET | Freq: Every day | ORAL | Status: DC
  Administered 2019-07-18 – 2019-07-21 (×4): 40 mg via ORAL
  Filled 2019-07-17 (×4): qty 1

## 2019-07-17 MED ORDER — LEVOTHYROXINE SODIUM 25 MCG PO TABS
25.0000 ug | ORAL_TABLET | Freq: Every day | ORAL | Status: DC
  Administered 2019-07-18 – 2019-07-21 (×4): 25 ug via ORAL
  Filled 2019-07-17 (×4): qty 1

## 2019-07-17 MED ORDER — SODIUM CHLORIDE 0.9% FLUSH
3.0000 mL | Freq: Two times a day (BID) | INTRAVENOUS | Status: DC
  Administered 2019-07-18 – 2019-07-20 (×7): 3 mL via INTRAVENOUS

## 2019-07-17 MED ORDER — SODIUM CHLORIDE 0.9 % IV SOLN: INTRAVENOUS | Status: DC

## 2019-07-17 MED ORDER — ASCORBIC ACID 500 MG PO TABS
500.0000 mg | ORAL_TABLET | Freq: Every day | ORAL | Status: DC
  Administered 2019-07-18 – 2019-07-21 (×4): 500 mg via ORAL
  Filled 2019-07-17 (×4): qty 1

## 2019-07-17 MED ORDER — SODIUM CHLORIDE 0.9 % IV SOLN
2.0000 g | Freq: Once | INTRAVENOUS | Status: AC
  Administered 2019-07-17: 2 g via INTRAVENOUS
  Filled 2019-07-17: qty 2

## 2019-07-17 MED ORDER — ACETAMINOPHEN 325 MG PO TABS
650.0000 mg | ORAL_TABLET | Freq: Once | ORAL | Status: AC
  Administered 2019-07-17: 650 mg via ORAL
  Filled 2019-07-17: qty 2

## 2019-07-17 MED ORDER — ONDANSETRON HCL 4 MG/2ML IJ SOLN: 4.0000 mg | Freq: Four times a day (QID) | INTRAMUSCULAR | Status: DC | PRN

## 2019-07-17 MED ORDER — PROSIGHT PO TABS
1.0000 | ORAL_TABLET | Freq: Two times a day (BID) | ORAL | Status: DC
  Filled 2019-07-17 (×3): qty 1

## 2019-07-17 MED ORDER — MONTELUKAST SODIUM 10 MG PO TABS
10.0000 mg | ORAL_TABLET | Freq: Every day | ORAL | Status: DC
  Administered 2019-07-18 – 2019-07-21 (×4): 10 mg via ORAL
  Filled 2019-07-17 (×4): qty 1

## 2019-07-17 MED ORDER — VANCOMYCIN HCL IN DEXTROSE 1-5 GM/200ML-% IV SOLN: 1000.0000 mg | Freq: Once | INTRAVENOUS | Status: DC

## 2019-07-17 NOTE — H&P (Signed)
History and Physical    Patient Demographics:    Deborah [name] POE:[id number] DOB: Mar 03, 1926 DOA: 07/17/2019  PCP: Lynnell Catalan, FNP  Patient coming from: Nursing home  I have personally briefly reviewed patient's old medical records in [place]  Chief Complaint: Fever, altered mental status   Assessment & Plan:     Assessment/Plan Principal Problem:   Sepsis ([place]) Active Problems:   Mixed hyperlipidemia   Permanent atrial fibrillation ([place])   DIASTOLIC HEART FAILURE, CHRONIC   CAD S/P percutaneous coronary angioplasty   COPD (chronic obstructive pulmonary disease) ([place])   Hypotension   Alzheimer disease ([place])   COVID-19 virus infection     Principal Problem: Sepsis possibly secondary to UTI Patient presented with fever of 101.  Mild hypotension noted.  Lactic acid is 2.1.  No tachycardia or leukocytosis noted.  Urinalysis shows mild abnormalities with 21-50 WBCs.  Concern for possible urinary tract infection.  Chest x-ray shows no acute cardiopulmonary disease.  Does also have an underlying history of COVID-19 diagnosed on 07/01/2019. -We will empirically cover with IV antibiotic -Monitor CBC, temp, lactic acid -Follow blood and urine cultures  Other Active Problems: COVID-19 infection Patient originally diagnosed with COVID-19 on 07/01/2019.  We will keep on isolation if she still has fever although it may be secondary to other infectious etiology.  COVID-19 PCR is still positive today.  Will place on zinc, ascorbic acid.  Expect we may be able to discontinue isolation once she is afebrile.  No hypoxemia noted.  At this point is not a candidate for steroids or other antivirals.  Mild acute kidney injury: Creatinine is 1.05 on presentation.  Baseline appears to be 0.6 -We will give gentle IV for resuscitation -Monitor input output, renal function closely  Chronic atrial fibrillation Rate is well controlled currently.  Not on anticoagulation due to risk of  falls. -Hold Cardizem due to presentation with hypotension -Continue digoxin -Continue aspirin  Chronic congestive heart failure with preserved ejection fraction No recent echocardiogram on record. -Hold Lasix  Coronary artery disease: Status post PCI with BMS to circumflex in 2009  -Does seem to have some new EKG changes currently but obviously continued for comparison is from 2019. -We will place on telemetry monitoring -serial troponins.  Alzheimer's dementia: Patient is confused at baseline.  Mental status is significantly changed -Continue sertraline -We will get PT evaluation  Seizure disorder No seizure activity reported currently.   -Continue Keppra.    Hypothyroidism: -Continue levothyroxine  Gastroesophageal flux disease: -Continue pantoprazole  DVT prophylaxis: Heparin Code Status: DNR/DNI, discussed with the patient daughter and he does not want any aggressive interventions. Family Communication: N/A  Disposition Plan: Admitted inpatient for sepsis evaluation, expect 2 to 3-day inpatient stay Consults called: N/A Admission status: Inpatient status    HPI:     HPI: Deborah [name] is a 86 y.o. female with medical history significant of coronary artery disease, atrial fibrillation, congestive heart failure, hypertension, hyperlipidemia, Alzheimer's dementia, seizure disorder who presented to the ER with weakness, nausea, vomiting, fever.  Patient was also slightly confused on arrival but improved back to baseline mental status following fluid resuscitation.  She was also noted to be increasingly weak.  Unable to obtain history from the patient due to baseline history of dementia. ED Course:  Vital Signs reviewed on presentation, significant for temperature 101, heart rate 83, blood pressure 112/71, saturation 97% on room air. Labs reviewed, significant for sodium 136, potassium 4.3, chloride 99, BUN 29, creatinine  1.05, LFTs within normal limits, BNP 217, lactic  acid 2.1, WBC count 5.8, hemoglobin 15.3, hematocrit 40.5, platelets 150, INR 1.1, urinalysis shows 21-50 WBCs, negative nitrite, small leukocytes.  Blood and urine cultures have been sent.  SARS COVID-19 screen was negative. Imaging personally Reviewed, chest x-ray shows no acute cardiopulmonary disease.  Trace left pleural effusion or pleural thickening.  Cardiomegaly without pulmonary edema.  EKG personally reviewed, shows atrial fibrillation, T wave inversions in inferior and lateral leads with ST depressions in the lateral leads.    Review of systems:    Review of Systems: Could not be done as the patient has history of dementia and is confused at baseline.    Past Medical and Surgical History:  Reviewed by me  Past Medical History:  Diagnosis Date  . Alzheimer disease (HCC)   . Asthma   . Atrial fibrillation (HCC)    Not anticoagulated with history of bleeding and fall risk  . Chronic diastolic heart failure (HCC)   . COPD (chronic obstructive pulmonary disease) (H[facility])   . Coronary atherosclerosis of native coronary artery    BMS circumflex 2009  . Dementia without behavioral disturbance (HCC)   . Drug intolerance    Flecacinide and Amiodarone   . Fracture of multiple pubic rami (HCC) 12/13   Left superior and inferior - following fall  . Hypotension   . Mixed hyperlipidemia   . Osteoarthritis   . Retroperitoneal hemorrhage 12/13   Following fall  . Seizures (HCC)    Remote history of over 20 years ago  . Umbilical hernia     Past Surgical History:  Procedure Laterality Date  . CATARACT EXTRACTION    . Radiofrequency catheter ablation     Failed  . Right carpel tunnel release    . TONSILLECTOMY       Social History:  Reviewed by me   reports that she quit smoking about 29 years ago. Her smoking use included cigarettes. She has a 20.00 pack-year smoking history. She has never used smokeless tobacco. She reports that she does not drink alcohol or use  drugs.  Allergies:    Allergies  Allergen Reactions  . Ivp Dye [Iodinated Diagnostic Agents] Shortness Of Breath    ??  . Omnipaque [Iohexol] Shortness Of Breath and Other (See Comments)    Short of breath with chest tightness after IV injection in CT, pt was fine when she left department but developed symptoms in parking lot and went to emergency department.  . Sulfa Antibiotics Shortness Of Breath  . Amiodarone   . Carbamazepine     REACTION: toxemia  . Dilaudid [Hydromorphone] Nausea And Vomiting  . Tramadol Other (See Comments)    confusion    Family History :   Family History  Problem Relation Age of Onset  . Coronary artery disease Mother   . Tuberculosis Paternal Grandfather   . Heart attack Maternal Grandmother    Family history reviewed, noted as above, not pertinent to current presentation.   Home Medications:    Prior to Admission medications   Medication Sig Start Date End Date Taking? Authorizing Provider  acetaminophen (TYLENOL) 500 MG tablet Take 1,000 mg by mouth every 6 (six) hours as needed for mild pain. For pain    Yes [provider]  alendronate (FOSAMAX) 70 MG tablet Take 70 mg by mouth every Wednesday. Take with a full glass of water on an empty stomach.    Yes [provider]  ALPRAZolam [name]) 0.25  MG tablet Take 0.25 mg by mouth every 8 (eight) hours as needed for anxiety.    Yes [provider]  aspirin (ASPIRIN EC) 81 MG EC tablet Take 1 tablet (81 mg total) by mouth daily. Swallow whole. 05/04/12  Yes [name], MD  calcium citrate-vitamin D 500-400 MG-UNIT chewable tablet Chew 1 tablet by mouth 2 (two) times daily.   Yes [provider]  digoxin (LANOXIN) 0.125 MG tablet Take 1 tablet (0.125 mg total) by mouth every evening. 09/21/16  Yes [name], [name], MD  diltiazem (CARDIZEM) 30 MG tablet Take 1 tablet (30 mg total) by mouth every 6 (six) hours. Patient taking differently: Take 30 mg by mouth  4 (four) times daily.  09/21/16  Yes [name], [name], MD  docusate sodium (COLACE) 100 MG capsule Take 100 mg by mouth at bedtime.   Yes [provider]  fluticasone (FLONASE) 50 MCG/ACT nasal spray Place 2 sprays into both nostrils daily.   Yes [provider]  furosemide (LASIX) 20 MG tablet Take 10 mg by mouth 2 (two) times daily.    Yes [provider]  gabapentin (NEURONTIN) 300 MG capsule Take 300 mg by mouth at bedtime.   Yes [provider]  guaiFENesin (MUCINEX) 600 MG 12 hr tablet Take 2 tablets (1,200 mg total) by mouth 2 (two) times daily. Patient taking differently: Take 600 mg by mouth daily.  09/21/16  Yes [name], [name], MD  isosorbide mononitrate (IMDUR) 30 MG 24 hr tablet Take 0.5 tablets (15 mg total) by mouth daily. 09/03/17  Yes Erick Blinks, MD  levETIRAcetam (KEPPRA) 250 MG tablet Take 125-250 mg by mouth See admin instructions. Takes one tablet in the morning and half a tablet at 3 pm.   Yes [provider]  levothyroxine (SYNTHROID, LEVOTHROID) 25 MCG tablet Take 25 mcg by mouth daily before breakfast.   Yes [provider]  Melatonin 3 MG TABS Take 5 mg by mouth at bedtime.    Yes [provider]  meloxicam (MOBIC) 15 MG tablet Take 15 mg by mouth daily.  06/27/18  Yes [provider]  midodrine (PROAMATINE) 2.5 MG tablet Take 1 tablet (2.5 mg total) by mouth 2 (two) times daily with a meal. The dosing can be titrated down to once daily when her systolic blood pressure is consistently in the 100s. 05/04/12  Yes [name], MD  montelukast (SINGULAIR) 10 MG tablet Take 10 mg by mouth daily.  09/13/17  Yes [provider]  Multiple Vitamins-Minerals (ICAPS) CAPS Take 1 capsule by mouth 2 (two) times daily.   Yes [provider]  ondansetron (ZOFRAN) 4 MG tablet Take 4 mg by mouth every 6 (six) hours as needed for nausea or vomiting.   Yes [provider]   pantoprazole (PROTONIX) 40 MG tablet Take 1 tablet (40 mg total) by mouth daily. 09/29/17  Yes Kilroy, Luke K, PA-C  potassium chloride (K-DUR,KLOR-CON) 10 MEQ tablet Take 10 mEq by mouth daily.  05/27/18  Yes [provider]  sertraline (ZOLOFT) 25 MG tablet Take 25 mg by mouth daily.   Yes [provider]  topiramate (TOPAMAX) 25 MG tablet Take 1 tablet by mouth daily as needed (for headache).  12/17/17  Yes [provider]    Physical Exam:    Physical Exam: Vitals:   07/17/19 1635 07/17/19 1700 07/17/19 1701 07/17/19 1745  BP: (!) 90/58 104/67  112/71  Pulse: 92 62  83  Resp: 20 (!) 22  (!) 32  Temp: (!) 101 F (38.3 C)     TempSrc: Oral     SpO2: 93% 93%  97%  Weight:   65.3 kg   Height:   5\' 5"  (1.651 m)     Constitutional: NAD, calm, comfortable Vitals:   07/17/19 1635 07/17/19 1700 07/17/19 1701 07/17/19 1745  BP: (!) 90/58 104/67  112/71  Pulse: 92 62  83  Resp: 20 (!) 22  (!) 32  Temp: (!) 101 F (38.3 C)     TempSrc: Oral     SpO2: 93% 93%  97%  Weight:   65.3 kg   Height:   5\' 5"  (1.651 m)    Eyes: PERRL, lids and conjunctivae normal ENMT: Mucous membranes are moist. Posterior pharynx clear of any exudate or lesions.Normal dentition.  Neck: normal, supple, no masses, no thyromegaly Respiratory: clear to auscultation bilaterally, no wheezing, no crackles. Normal respiratory effort. No accessory muscle use.  Cardiovascular: Regular rate and rhythm, no murmurs / rubs / gallops. No extremity edema. 2+ pedal pulses. No carotid bruits.  Abdomen: no tenderness, no masses palpated. No hepatosplenomegaly. Bowel sounds positive.  Musculoskeletal: no clubbing / cyanosis. No joint deformity upper and lower extremities. Good ROM, no contractures. Normal muscle tone.  Skin: no rashes, lesions, ulcers. No induration Neurologic: CN 2-12 grossly intact. Strength 5/5 in all 4.  Psychiatric: Patient confused at baseline due to history of dementia,  judgment and insight is impaired.   Decubitus Ulcers: Not present on admission Catheters and tubes: None  Data Review:    Labs on Admission: I have personally reviewed following labs and imaging studies  CBC: Recent Labs  Lab 07/17/19 1701  WBC 5.8  NEUTROABS 5.3  HGB 15.3*  HCT 48.5*  MCV 92.6  PLT 150   Basic Metabolic Panel: Recent Labs  Lab 07/17/19 1701  NA 136  K 4.3  CL 99  CO2 27  GLUCOSE 112*  BUN 29*  CREATININE 1.05*  CALCIUM 9.1   GFR: Estimated Creatinine Clearance: 31.4 mL/min (A) (by C-G formula based on SCr of 1.05 mg/dL (H)). Liver Function Tests: Recent Labs  Lab 07/17/19 1701  AST 32  ALT 18  ALKPHOS 71  BILITOT 0.6  PROT 6.8  ALBUMIN 3.8   Recent Labs  Lab 07/17/19 1701  LIPASE 27   No results for input(s): AMMONIA in the last 168 hours. Coagulation Profile: Recent Labs  Lab 07/17/19 1701  INR 1.1   Cardiac Enzymes: No results for input(s): CKTOTAL, CKMB, CKMBINDEX, TROPONINI in the last 168 hours. BNP (last 3 results) No results for input(s): PROBNP in the last 8760 hours. HbA1C: No results for input(s): HGBA1C in the last 72 hours. CBG: No results for input(s): GLUCAP in the last 168 hours. Lipid Profile: No results for input(s): CHOL, HDL, LDLCALC, TRIG, CHOLHDL, LDLDIRECT in the last 72 hours. Thyroid Function Tests: No results for input(s): TSH, T4TOTAL, FREET4, T3FREE, THYROIDAB in the last 72 hours. Anemia Panel: No results for input(s): VITAMINB12, FOLATE, FERRITIN, TIBC, IRON, RETICCTPCT in the last 72 hours. Urine analysis:    Component Value Date/Time   COLORURINE YELLOW 07/17/2019 1635   APPEARANCEUR HAZY (A) 07/17/2019 1635   LABSPEC 1.019 07/17/2019 1635   PHURINE 5.0 07/17/2019 1635   GLUCOSEU NEGATIVE 07/17/2019 1635   HGBUR NEGATIVE 07/17/2019 1635   BILIRUBINUR NEGATIVE 07/17/2019 1635   KETONESUR 5 (A) 07/17/2019 1635   PROTEINUR 30 (A) 07/17/2019 1635   UROBILINOGEN 0.2  04/28/2012 1830    NITRITE NEGATIVE 07/17/2019 1635   LEUKOCYTESUR SMALL (A) 07/17/2019 1635     Imaging Results:      Radiological Exams on Admission: DG Chest Port 1 View  Result Date: 07/17/2019 CLINICAL DATA:  Weakness, COVID positive EXAM: PORTABLE CHEST 1 VIEW COMPARISON:  09/14/2017 FINDINGS: Cardiomegaly with aortic atherosclerosis. Trace left pleural effusion or thickening. No focal airspace disease. No pneumothorax. IMPRESSION: 1. Trace left pleural effusion or pleural thickening. 2. Cardiomegaly without edema. 3. No acute focal airspace disease Electronically Signed   By: Jasmine Pang M.D.   On: 07/17/2019 18:31      Gilliam Hawkes Cyndie Chime MD [facility]  If 7PM-7AM, please contact night-coverage   07/17/2019, 7:47 PM

## 2019-07-17 NOTE — ED Notes (Signed)
Date and time results received: 07/17/19 5:54 PM  (use smartphrase ".now" to insert current time)  Test: lactic acid Critical Value: 2.1  Name of Provider Notified: Zackowski  Orders Received? Or Actions Taken?: Orders Received - See Orders for details

## 2019-07-17 NOTE — ED Triage Notes (Signed)
Patient c/o nausea/vomiting weakness since last night. Per facility patient unable to hold down breakfast this am and had continued c/o weakness and nausea. Pt. covid-19 positive 16 days ago per facility.

## 2019-07-17 NOTE — ED Notes (Signed)
Staff from [facility] updated at this time. Staff reported pt was Covid positive in the past 2 weeks but was asymptomatic until a few days ago.

## 2019-07-17 NOTE — ED Notes (Signed)
Pt bp low, admitting MD made aware.

## 2019-07-17 NOTE — Sepsis Progress Note (Cosign Needed)
Notified bedside nurse of need to draw repeat lactic acid after fluid resusitation is completed. 

## 2019-07-17 NOTE — ED Notes (Signed)
Date and time results received: 07/17/19 2041 (use smartphrase ".now" to insert current time)  Test:Covid Critical Value: Positive  Name of Provider Notified: Dr Teodoro Kil  Orders Received? Or Actions Taken?: NA

## 2019-07-17 NOTE — ED Provider Notes (Signed)
[facility] Provider Note   CSN: [id number] Arrival date & time: 07/17/19  1546     History Chief Complaint  Patient presents with  . Weakness  . Nausea    Deborah [name] is a 86 y.o. female.  Patient is sent in from [place].  Has a known history of dementia.  Positive COVID-19 infection test on 16 days ago per the facility.  Patient with nausea vomiting weakness that started last night.  Patient is unable to hold breakfast down this morning.  And continued to have nausea.  Patient is known to have Alzheimer's.  Patient is a DNR.  Patient known to have COPD chronic systolic heart failure.  Patient confused upon arrival.  Seems to be more confused than her baseline.  But difficult to say with certainty.        Past Medical History:  Diagnosis Date  . Alzheimer disease ([place])   . Asthma   . Atrial fibrillation ([place])    Not anticoagulated with history of bleeding and fall risk  . Chronic diastolic heart failure ([place])   . COPD (chronic obstructive pulmonary disease) ([place])   . Coronary atherosclerosis of native coronary artery    BMS circumflex 2009  . Dementia without behavioral disturbance ([place])   . Drug intolerance    Flecacinide and Amiodarone   . Fracture of multiple pubic rami ([place]) 12/13   Left superior and inferior - following fall  . Hypotension   . Mixed hyperlipidemia   . Osteoarthritis   . Retroperitoneal hemorrhage 12/13   Following fall  . Seizures ([place])    Remote history of over 20 years ago  . Umbilical hernia     Patient Active Problem List   Diagnosis Date Noted  . Sepsis ([place]) 07/17/2019  . COVID-19 virus infection 07/17/2019  . Chest pain 09/02/2017  . Compression fracture of body of thoracic vertebra ([place]) 06/22/2017  . Atrial fibrillation with rapid ventricular response ([place])   . Screening for colorectal cancer 03/24/2017  . Hemorrhoids 03/24/2017  . Itching in the vaginal area 03/24/2017  . Vulval  thrush 03/24/2017  . Hypoxemia   . Alzheimer disease ([place]) 09/13/2016  . Seizures ([place]) 09/13/2016  . Hypokalemia 09/13/2016  . COPD exacerbation ([place]) 09/13/2016  . Hypotension 04/22/2012  . CAD S/P percutaneous coronary angioplasty   . COPD (chronic obstructive pulmonary disease) ([place])   . Mixed hyperlipidemia 05/14/2009  . Permanent atrial fibrillation ([place]) 02/10/2009  . DIASTOLIC HEART FAILURE, CHRONIC 02/10/2009    Past Surgical History:  Procedure Laterality Date  . CATARACT EXTRACTION    . Radiofrequency catheter ablation     Failed  . Right carpel tunnel release    . TONSILLECTOMY       OB History    Gravida  1   Para  1   Term  1   Preterm      AB      Living  1     SAB      TAB      Ectopic      Multiple      Live Births  1           Family History  Problem Relation Age of Onset  . Coronary artery disease Mother   . Tuberculosis Paternal Grandfather   . Heart attack Maternal Grandmother     Social History   Tobacco Use  . Smoking status: Former Smoker    Packs/day: 0.80  Years: 25.00    Pack years: 20.00    Types: Cigarettes    Quit date: 05/23/1990    Years since quitting: 29.1  . Smokeless tobacco: Never Used  Substance Use Topics  . Alcohol use: No    Alcohol/week: 0.0 standard drinks  . Drug use: No    Home Medications Prior to Admission medications   Medication Sig Start Date End Date Taking? Authorizing Provider  acetaminophen (TYLENOL) 500 MG tablet Take 1,000 mg by mouth every 6 (six) hours as needed for mild pain. For pain    Yes [provider]  alendronate (FOSAMAX) 70 MG tablet Take 70 mg by mouth every Wednesday. Take with a full glass of water on an empty stomach.    Yes [provider]  ALPRAZolam (XANAX) 0.25 MG tablet Take 0.25 mg by mouth every 8 (eight) hours as needed for anxiety.    Yes [provider]  aspirin (ASPIRIN EC) 81 MG EC tablet Take 1 tablet (81 mg total) by mouth  daily. Swallow whole. 05/04/12  Yes Rexene Alberts, MD  calcium citrate-vitamin D 500-400 MG-UNIT chewable tablet Chew 1 tablet by mouth 2 (two) times daily.   Yes [provider]  digoxin (LANOXIN) 0.125 MG tablet Take 1 tablet (0.125 mg total) by mouth every evening. 09/21/16  Yes Isaac Bliss, Rayford Halsted, MD  diltiazem (CARDIZEM) 30 MG tablet Take 1 tablet (30 mg total) by mouth every 6 (six) hours. Patient taking differently: Take 30 mg by mouth 4 (four) times daily.  09/21/16  Yes Isaac Bliss, Rayford Halsted, MD  docusate sodium (COLACE) 100 MG capsule Take 100 mg by mouth at bedtime.   Yes [provider]  fluticasone (FLONASE) 50 MCG/ACT nasal spray Place 2 sprays into both nostrils daily.   Yes [provider]  furosemide (LASIX) 20 MG tablet Take 10 mg by mouth 2 (two) times daily.    Yes [provider]  gabapentin (NEURONTIN) 300 MG capsule Take 300 mg by mouth at bedtime.   Yes [provider]  guaiFENesin (MUCINEX) 600 MG 12 hr tablet Take 2 tablets (1,200 mg total) by mouth 2 (two) times daily. Patient taking differently: Take 600 mg by mouth daily.  09/21/16  Yes Isaac Bliss, Rayford Halsted, MD  isosorbide mononitrate (IMDUR) 30 MG 24 hr tablet Take 0.5 tablets (15 mg total) by mouth daily. 09/03/17  Yes Kathie Dike, MD  levETIRAcetam (KEPPRA) 250 MG tablet Take 125-250 mg by mouth See admin instructions. Takes one tablet in the morning and half a tablet at 3 pm.   Yes [provider]  levothyroxine (SYNTHROID, LEVOTHROID) 25 MCG tablet Take 25 mcg by mouth daily before breakfast.   Yes [provider]  Melatonin 3 MG TABS Take 5 mg by mouth at bedtime.    Yes [provider]  meloxicam (MOBIC) 15 MG tablet Take 15 mg by mouth daily.  06/27/18  Yes [provider]  midodrine (PROAMATINE) 2.5 MG tablet Take 1 tablet (2.5 mg total) by mouth 2 (two) times daily with a meal. The dosing can be titrated down to once  daily when her systolic blood pressure is consistently in the 100s. 05/04/12  Yes Rexene Alberts, MD  montelukast (SINGULAIR) 10 MG tablet Take 10 mg by mouth daily.  09/13/17  Yes [provider]  Multiple Vitamins-Minerals (ICAPS) CAPS Take 1 capsule by mouth 2 (two) times daily.   Yes [provider]  ondansetron (ZOFRAN) 4 MG tablet Take 4  mg by mouth every 6 (six) hours as needed for nausea or vomiting.   Yes [provider]  pantoprazole (PROTONIX) 40 MG tablet Take 1 tablet (40 mg total) by mouth daily. 09/29/17  Yes Kilroy, Luke K, PA-C  potassium chloride (K-DUR,KLOR-CON) 10 MEQ tablet Take 10 mEq by mouth daily.  05/27/18  Yes [provider]  sertraline (ZOLOFT) 25 MG tablet Take 25 mg by mouth daily.   Yes [provider]  topiramate (TOPAMAX) 25 MG tablet Take 1 tablet by mouth daily as needed (for headache).  12/17/17  Yes [provider]    Allergies    Ivp dye [iodinated diagnostic agents], Omnipaque [iohexol], Sulfa antibiotics, Amiodarone, Carbamazepine, Dilaudid [hydromorphone], and Tramadol  Review of Systems   Review of Systems  Unable to perform ROS: Dementia    Physical Exam Updated Vital Signs BP 112/71   Pulse 83   Temp (!) 101 F (38.3 C) (Oral)   Resp (!) 32   Ht 1.651 m ('5\' 5"' )   Wt 65.3 kg   SpO2 97%   BMI 23.96 kg/m   Physical Exam Vitals and nursing note reviewed.  Constitutional:      General: She is in acute distress.     Appearance: Normal appearance. She is well-developed. She is ill-appearing.  HENT:     Head: Normocephalic and atraumatic.     Mouth/Throat:     Mouth: Mucous membranes are dry.  Eyes:     Extraocular Movements: Extraocular movements intact.     Conjunctiva/sclera: Conjunctivae normal.     Pupils: Pupils are equal, round, and reactive to light.  Cardiovascular:     Rate and Rhythm: Normal rate and regular rhythm.     Heart sounds: No murmur.  Pulmonary:     Effort:  Respiratory distress present.     Breath sounds: Normal breath sounds.  Abdominal:     Palpations: Abdomen is soft.     Tenderness: There is no abdominal tenderness.  Musculoskeletal:        General: No swelling.     Cervical back: Neck supple.  Skin:    General: Skin is warm and dry.     Capillary Refill: Capillary refill takes 2 to 3 seconds.  Neurological:     Mental Status: She is alert.     Comments: Patient confused.  But spontaneously moving all 4 extremities.     ED Results / Procedures / Treatments   Labs (all labs ordered are listed, but only abnormal results are displayed) Labs Reviewed  CBC WITH DIFFERENTIAL/PLATELET - Abnormal; Notable for the following components:      Result Value   RBC 5.24 (*)    Hemoglobin 15.3 (*)    HCT 48.5 (*)    Lymphs Abs 0.2 (*)    All other components within normal limits  COMPREHENSIVE METABOLIC PANEL - Abnormal; Notable for the following components:   Glucose, Bld 112 (*)    BUN 29 (*)    Creatinine, Ser 1.05 (*)    GFR calc non Af Amer 46 (*)    GFR calc Af Amer 54 (*)    All other components within normal limits  URINALYSIS, ROUTINE W REFLEX MICROSCOPIC - Abnormal; Notable for the following components:   APPearance HAZY (*)    Ketones, ur 5 (*)    Protein, ur 30 (*)    Leukocytes,Ua SMALL (*)    Bacteria, UA RARE (*)    All other components within normal limits  BRAIN  NATRIURETIC PEPTIDE - Abnormal; Notable for the following components:   B Natriuretic Peptide 217.0 (*)    All other components within normal limits  LACTIC ACID, PLASMA - Abnormal; Notable for the following components:   Lactic Acid, Venous 2.1 (*)    All other components within normal limits  CULTURE, BLOOD (ROUTINE X 2)  CULTURE, BLOOD (ROUTINE X 2)  URINE CULTURE  RESPIRATORY PANEL BY RT PCR (FLU A&B, COVID)  LIPASE, BLOOD  APTT  PROTIME-INR  LACTIC ACID, PLASMA  POC SARS CORONAVIRUS 2 AG -  ED    EKG EKG  Interpretation  Date/Time:  Wednesday July 17 2019 17:01:33 EST Ventricular Rate:  92 PR Interval:    QRS Duration: 89 QT Interval:  290 QTC Calculation: 359 R Axis:   46 Text Interpretation: Atrial fibrillation Repol abnrm suggests ischemia, inferior leads Confirmed by Fredia Sorrow [id number]) on 07/17/2019 5:06:56 PM   Radiology DG Chest Port 1 View  Result Date: 07/17/2019 CLINICAL DATA:  Weakness, COVID positive EXAM: PORTABLE CHEST 1 VIEW COMPARISON:  09/14/2017 FINDINGS: Cardiomegaly with aortic atherosclerosis. Trace left pleural effusion or thickening. No focal airspace disease. No pneumothorax. IMPRESSION: 1. Trace left pleural effusion or pleural thickening. 2. Cardiomegaly without edema. 3. No acute focal airspace disease Electronically Signed   By: Donavan Foil M.D.   On: 07/17/2019 18:31    Procedures Procedures (including critical care time) CRITICAL CARE Performed by: Fredia Sorrow Total critical care time: 30 minutes Critical care time was exclusive of separately billable procedures and treating other patients. Critical care was necessary to treat or prevent imminent or life-threatening deterioration. Critical care was time spent personally by me on the following activities: development of treatment plan with patient and/or surrogate as well as nursing, discussions with consultants, evaluation of patient's response to treatment, examination of patient, obtaining history from patient or surrogate, ordering and performing treatments and interventions, ordering and review of laboratory studies, ordering and review of radiographic studies, pulse oximetry and re-evaluation of patient's condition.   Medications Ordered in ED Medications  0.9 %  sodium chloride infusion (has no administration in time range)  sodium chloride 0.9 % bolus 1,000 mL (1,000 mLs Intravenous New Bag/Given 07/17/19 1800)    And  sodium chloride 0.9 % bolus 1,000 mL (1,000 mLs Intravenous New  Bag/Given 07/17/19 1707)    And  sodium chloride 0.9 % bolus 250 mL (has no administration in time range)  ondansetron (ZOFRAN) injection 4 mg (4 mg Intravenous Given 07/17/19 1710)  ceFEPIme (MAXIPIME) 2 g in sodium chloride 0.9 % 100 mL IVPB (2 g Intravenous New Bag/Given 07/17/19 1710)  metroNIDAZOLE (FLAGYL) IVPB 500 mg (500 mg Intravenous New Bag/Given 07/17/19 1725)  acetaminophen (TYLENOL) tablet 650 mg (650 mg Oral Given 07/17/19 1711)  vancomycin (VANCOREADY) IVPB 1250 mg/250 mL (1,250 mg Intravenous New Bag/Given 07/17/19 1803)    ED Course  I have reviewed the triage vital signs and the nursing notes.  Pertinent labs & imaging results that were available during my care of the patient were reviewed by me and considered in my medical decision making (see chart for details).    MDM Rules/Calculators/A&P                     Patient ill-appearing upon arrival.  Mental status seem to be more depressed than her baseline.  She is febrile respiratory rate was up.  And blood pressure was very borderline at 90.  So sepsis parameters were met and sepsis  protocol was ordered including broad-spectrum antibiotics and she did receive 30 cc/kg of fluid.  Patient improved significantly with this.  Blood pressure now up around 568 systolic.  Patient is a DNR.  She is from [place].  She is now more alert seems to meet her baseline for her dementia.  She was diagnosed with Covid 19 infection 16 days ago.  She was sent in for weakness nausea and vomiting since last night.  Patient was not able to hold breakfast down today.  Far as a source goes chest x-ray without any significant findings however urinalysis raises some concerns for urinary tract infection.  However nitrite negative.  Feel patient needs monitoring overnight to make sure that she is stabilized out and that she does not get worse.  Final Clinical Impression(s) / ED Diagnoses Final diagnoses:  Sepsis, due to unspecified organism,  unspecified whether acute organ dysfunction present [facility])  Acute cystitis without hematuria    Rx / DC Orders ED Discharge Orders    None       Fredia Sorrow, MD 07/17/19 2001

## 2019-07-18 ENCOUNTER — Encounter (HOSPITAL_COMMUNITY): Payer: Self-pay | Admitting: Internal Medicine

## 2019-07-18 DIAGNOSIS — F0280 Dementia in other diseases classified elsewhere without behavioral disturbance: Secondary | ICD-10-CM

## 2019-07-18 DIAGNOSIS — I4821 Permanent atrial fibrillation: Secondary | ICD-10-CM

## 2019-07-18 DIAGNOSIS — A419 Sepsis, unspecified organism: Secondary | ICD-10-CM

## 2019-07-18 DIAGNOSIS — N3000 Acute cystitis without hematuria: Secondary | ICD-10-CM

## 2019-07-18 DIAGNOSIS — U071 COVID-19: Secondary | ICD-10-CM

## 2019-07-18 DIAGNOSIS — N179 Acute kidney failure, unspecified: Secondary | ICD-10-CM

## 2019-07-18 DIAGNOSIS — G308 Other Alzheimer's disease: Secondary | ICD-10-CM

## 2019-07-18 DIAGNOSIS — E782 Mixed hyperlipidemia: Secondary | ICD-10-CM

## 2019-07-18 LAB — BASIC METABOLIC PANEL
Anion gap: 8 (ref 5–15)
BUN: 28 mg/dL — ABNORMAL HIGH (ref 8–23)
CO2: 21 mmol/L — ABNORMAL LOW (ref 22–32)
Calcium: 7.8 mg/dL — ABNORMAL LOW (ref 8.9–10.3)
Chloride: 109 mmol/L (ref 98–111)
Creatinine, Ser: 0.8 mg/dL (ref 0.44–1.00)
GFR calc Af Amer: >60 mL/min (ref >60)
GFR calc non Af Amer: >60 mL/min (ref >60)
Glucose, Bld: 99 mg/dL (ref 70–99)
Potassium: 3.9 mmol/L (ref 3.5–5.1)
Sodium: 138 mmol/L (ref 135–145)

## 2019-07-18 LAB — TROPONIN I (HIGH SENSITIVITY): Troponin I (High Sensitivity): 15 ng/L (ref <18)

## 2019-07-18 LAB — CBC
HCT: 50 % — ABNORMAL HIGH (ref 36.0–46.0)
Hemoglobin: 15.2 g/dL — ABNORMAL HIGH (ref 12.0–15.0)
MCH: 29.9 pg (ref 26.0–34.0)
MCHC: 30.4 g/dL (ref 30.0–36.0)
MCV: 98.2 fL (ref 80.0–100.0)
Platelets: 119 10*3/uL — ABNORMAL LOW (ref 150–400)
RBC: 5.09 MIL/uL (ref 3.87–5.11)
RDW: 15.8 % — ABNORMAL HIGH (ref 11.5–15.5)
WBC: 4.4 10*3/uL (ref 4.0–10.5)
nRBC: 0 % (ref 0.0–0.2)

## 2019-07-18 LAB — MAGNESIUM: Magnesium: 2 mg/dL (ref 1.7–2.4)

## 2019-07-18 LAB — FERRITIN: Ferritin: 610 ng/mL — ABNORMAL HIGH (ref 11–307)

## 2019-07-18 LAB — C-REACTIVE PROTEIN: CRP: 10.9 mg/dL — ABNORMAL HIGH (ref <1.0)

## 2019-07-18 LAB — PROCALCITONIN: Procalcitonin: 0.49 ng/mL

## 2019-07-18 LAB — D-DIMER, QUANTITATIVE: D-Dimer, Quant: 9.16 ug/mL-FEU — ABNORMAL HIGH (ref 0.00–0.50)

## 2019-07-18 LAB — PHOSPHORUS: Phosphorus: 2.4 mg/dL — ABNORMAL LOW (ref 2.5–4.6)

## 2019-07-18 LAB — MRSA PCR SCREENING: MRSA by PCR: POSITIVE — AB

## 2019-07-18 LAB — ABO/RH: ABO/RH(D): A POS

## 2019-07-18 MED ORDER — OCUVITE-LUTEIN PO CAPS
1.0000 | ORAL_CAPSULE | Freq: Two times a day (BID) | ORAL | Status: DC
  Administered 2019-07-18 – 2019-07-21 (×7): 1 via ORAL
  Filled 2019-07-18 (×7): qty 1

## 2019-07-18 MED ORDER — MELATONIN 3 MG PO TABS
5.0000 mg | ORAL_TABLET | Freq: Every day | ORAL | Status: DC
  Administered 2019-07-18 – 2019-07-20 (×4): 4.5 mg via ORAL
  Filled 2019-07-18 (×4): qty 2

## 2019-07-18 MED ORDER — MUPIROCIN 2 % EX OINT
1.0000 "application " | TOPICAL_OINTMENT | Freq: Two times a day (BID) | CUTANEOUS | Status: DC
  Administered 2019-07-18 – 2019-07-21 (×7): 1 via NASAL
  Filled 2019-07-18 (×2): qty 22

## 2019-07-18 MED ORDER — CHLORHEXIDINE GLUCONATE CLOTH 2 % EX PADS
6.0000 | MEDICATED_PAD | Freq: Every day | CUTANEOUS | Status: DC
  Administered 2019-07-18 – 2019-07-20 (×3): 6 via TOPICAL

## 2019-07-18 NOTE — Progress Notes (Signed)
mrsa pcr screen came back positive. Standing orders implemented.

## 2019-07-18 NOTE — Progress Notes (Signed)
Pt has had a few low bp's since falling asleep. Before sleep she was 120s sys. Now she is in the 75-85 sys range. Her map has been greater than 65. MD has been made aware via text page.

## 2019-07-18 NOTE — Evaluation (Signed)
Physical Therapy Evaluation Patient Details Name: Deborah [name] MRN: [id number] DOB: [birth year] Today's Date: 07/18/2019   History of Present Illness  Deborah [name] is a 86 y.o. female with medical history significant of coronary artery disease, atrial fibrillation, congestive heart failure, hypertension, hyperlipidemia, Alzheimer's dementia, seizure disorder who presented to the ER with weakness, nausea, vomiting, fever.  Patient was also slightly confused on arrival but improved back to baseline mental status following fluid resuscitation.  She was also noted to be increasingly weak.  Unable to obtain history from the patient due to baseline history of dementia.    Clinical Impression  Patient limited to a few side steps at bedside due to weakness and poor standing balance, required tactile cue to move feet when taking steps, fatigues easily and put back to bed after therapy.  Patient will benefit from continued physical therapy in hospital and recommended venue below to increase strength, balance, endurance for safe ADLs and gait.    Follow Up Recommendations SNF;Supervision for mobility/OOB;Supervision - Intermittent    Equipment Recommendations  None recommended by PT    Recommendations for Other Services       Precautions / Restrictions Precautions Precautions: Fall Restrictions Weight Bearing Restrictions: No      Mobility  Bed Mobility Overal bed mobility: Needs Assistance Bed Mobility: Supine to Sit;Sit to Supine     Supine to sit: Mod assist Sit to supine: Mod assist   General bed mobility comments: slow labored movement  Transfers Overall transfer level: Needs assistance Equipment used: Rolling walker (2 wheeled) Transfers: Sit to/from Stand Sit to Stand: Mod assist         General transfer comment: very unsteady on feet  Ambulation/Gait Ambulation/Gait assistance: Max assist Gait Distance (Feet): 3 Feet Assistive device: Rolling walker (2  wheeled) Gait Pattern/deviations: Decreased step length - right;Decreased step length - left;Decreased stride length Gait velocity: slow   General Gait Details: limited to 2-3 unsteady labored side steps requiring tactile assistance to move feet due to weaknes, poor standing balance  Stairs            Wheelchair Mobility    Modified Rankin (Stroke Patients Only)       Balance Overall balance assessment: Needs assistance Sitting-balance support: Feet supported;No upper extremity supported Sitting balance-Leahy Scale: Fair Sitting balance - Comments: seated at EOB   Standing balance support: During functional activity;No upper extremity supported Standing balance-Leahy Scale: Poor Standing balance comment: using RW                             Pertinent Vitals/Pain Pain Assessment: No/denies pain    Home Living Family/patient expects to be discharged to:: Skilled nursing facility                      Prior Function Level of Independence: Needs assistance   Gait / Transfers Assistance Needed: household ambulator with RW/SPC  ADL's / Homemaking Assistance Needed: assisted by SNF staff        Hand Dominance   Dominant Hand: Right    Extremity/Trunk Assessment   Upper Extremity Assessment Upper Extremity Assessment: Generalized weakness    Lower Extremity Assessment Lower Extremity Assessment: Generalized weakness    Cervical / Trunk Assessment Cervical / Trunk Assessment: Kyphotic  Communication   Communication: Expressive difficulties  Cognition Arousal/Alertness: Awake/alert Behavior During Therapy: WFL for tasks assessed/performed Overall Cognitive Status: History of cognitive impairments - at baseline  General Comments      Exercises     Assessment/Plan    PT Assessment Patient needs continued PT services  PT Problem List Decreased strength;Decreased activity  tolerance;Decreased balance;Decreased mobility       PT Treatment Interventions DME instruction;Gait training;Stair training;Functional mobility training;Therapeutic activities;Therapeutic exercise;Patient/family education    PT Goals (Current goals can be found in the Care Plan section)  Acute Rehab PT Goals Patient Stated Goal: return home PT Goal Formulation: With patient Time For Goal Achievement: 07/31/19 Potential to Achieve Goals: Good    Frequency Min 3X/week   Barriers to discharge        Co-evaluation               AM-PAC PT "6 Clicks" Mobility  Outcome Measure Help needed turning from your back to your side while in a flat bed without using bedrails?: A Lot Help needed moving from lying on your back to sitting on the side of a flat bed without using bedrails?: A Lot Help needed moving to and from a bed to a chair (including a wheelchair)?: A Lot Help needed standing up from a chair using your arms (e.g., wheelchair or bedside chair)?: A Lot Help needed to walk in hospital room?: A Lot Help needed climbing 3-5 steps with a railing? : Total 6 Click Score: 11    End of Session   Activity Tolerance: Patient tolerated treatment well;Patient limited by fatigue Patient left: in bed;with bed alarm set;with call bell/phone within reach Nurse Communication: Mobility status PT Visit Diagnosis: Unsteadiness on feet (R26.81);Other abnormalities of gait and mobility (R26.89);Muscle weakness (generalized) (M62.81)    Time: 9381-0175 PT Time Calculation (min) (ACUTE ONLY): 26 min   Charges:   PT Evaluation $PT Eval Moderate Complexity: 1 Mod PT Treatments $Therapeutic Activity: 23-37 mins        2:08 PM, 07/18/19 Lonell Grandchild, MPT Physical Therapist with [facility] [phone] office [phone] mobile phone

## 2019-07-18 NOTE — Progress Notes (Signed)
PROGRESS NOTE  Deborah [name] WUJ:[id number] DOB: Nov 19, 1925 DOA: 07/17/2019 PCP: Marlowe Aschoff, FNP  Brief History:  86 year old female with a history of Alzheimer's dementia, coronary disease, permanent atrial fibrillation, diastolic CHF, hypertension, hyperlipidemia, seizure order presenting with 1 day history of generalized weakness, nausea and vomiting that started on 07/16/2019 evening and continued through breakfast on 07/17/2019.  In addition, the patient was noted to be more confused than her usual baseline.  Unfortunately, the patient is unable to provide history secondary to her dementia.  Apparently, the patient also was noted to have a fever at [facility].  She was diagnosed with COVID-19 on 07/01/2019.  The patient herself denies any chest pain, coughing, hemoptysis, diarrhea, abdominal pain, hematochezia, melena. In the emergency department, the patient had a fever up to 101.0 F with tachycardia and soft blood pressures with systolic BP in the upper 80s and low 90s.  Oxygen saturation was 100% 2 L.  The patient was started on IV fluids and ceftriaxone pending culture data.  Assessment/Plan: Sepsis -present on admission -Secondary to UTI -Lactic acid peaked 2.1>>>less than 0.3 -Procalcitonin 0.49 -Continue IV fluids -Continue IV ceftriaxone -Follow blood and urine cultures  UTI -UA 21-50WBC -Continue ceftriaxone pending urine culture data  COVID-19 disease/gastroenteritis -Continue to trend inflammatory markers -The patient does not have any dyspnea or hypoxia -Personally view chest x-ray--left pleural thickening, no consolidation or edema -CRP 13.8>>> -Ferritin 626>>>610 -D-dimer  Acute kidney injury -Secondary to sepsis and volume depletion -Baseline creatinine 0.5-2.6 -Serum creatinine peaked 1.05 -Continue IV fluids -holding mobic  Permanent atrial fibrillation -Rate is slightly elevated secondary to sepsis -Not an anticoagulation candidate secondary  to GI bleed and falls -Continue aspirin -continue digoxin -holding cardizem due to soft BPs  Chronic diastolic CHF -The patient appears hypovolemic presently -Judicious IV fluids -holding lasix due to volume depletion  Hypothyroidism -Continue Synthroid  Dementia without behavioral disturbance -continue zoloft  Essential Hypertension -holding cardizem, imdur due to soft BPs  Seizure disorder -continue Keppra     Disposition Plan: Patient From: [facility] D/C Place: [facility] - 2-3  Days Barriers: Not Clinically Stable--hypotension, on IV abx  Family Communication:   No Family at bedside  Consultants:  none  Code Status:  DNR  DVT Prophylaxis:  [place] Heparin    Procedures: As Listed in Progress Note Above  Antibiotics: Cefepime 2/24 Ceftriaxone 2/25>>       Subjective: Patient denies fevers, chills, headache, chest pain, dyspnea, nausea, vomiting, diarrhea, abdominal pain, dysuria.  She is pleasantly confused   Objective: Vitals:   07/18/19 0400 07/18/19 0500 07/18/19 0600 07/18/19 0700  BP: (!) 79/68  (!) 76/65 (!) 67/58  Pulse: 62  75 62  Resp: 13  15 16   Temp:      TempSrc:      SpO2: 97%  95% 96%  Weight:  65.6 kg    Height:       No intake or output data in the 24 hours ending 07/18/19 1000 Weight change:  Exam:   General:  Pt is alert, follows commands appropriately, not in acute distress  HEENT: No icterus, No thrush, No neck mass, [place]/AT  Cardiovascular: IRRR, S1/S2, no rubs, no gallops  Respiratory: bibasilar crackles. No wheeze  Abdomen: Soft/+BS, non tender, non distended, no guarding  Extremities: No edema, No lymphangitis, No petechiae, No rashes, no synovitis   Data Reviewed: I have personally reviewed following labs and imaging studies Basic Metabolic Panel: Recent Labs  Lab 07/17/19 1701 07/18/19 0519  NA 136 138  K 4.3 3.9  CL 99 109  CO2 27 21*  GLUCOSE 112* 99  BUN 29* 28*  CREATININE 1.05* 0.80  CALCIUM  9.1 7.8*  MG  --  2.0  PHOS  --  2.4*   Liver Function Tests: Recent Labs  Lab 07/17/19 1701  AST 32  ALT 18  ALKPHOS 71  BILITOT 0.6  PROT 6.8  ALBUMIN 3.8   Recent Labs  Lab 07/17/19 1701  LIPASE 27   No results for input(s): AMMONIA in the last 168 hours. Coagulation Profile: Recent Labs  Lab 07/17/19 1701  INR 1.1   CBC: Recent Labs  Lab 07/17/19 1701 07/18/19 0606  WBC 5.8 4.4  NEUTROABS 5.3  --   HGB 15.3* 15.2*  HCT 48.5* 50.0*  MCV 92.6 98.2  PLT 150 119*   Cardiac Enzymes: No results for input(s): CKTOTAL, CKMB, CKMBINDEX, TROPONINI in the last 168 hours. BNP: Invalid input(s): POCBNP CBG: No results for input(s): GLUCAP in the last 168 hours. HbA1C: No results for input(s): HGBA1C in the last 72 hours. Urine analysis:    Component Value Date/Time   COLORURINE YELLOW 07/17/2019 1635   APPEARANCEUR HAZY (A) 07/17/2019 1635   LABSPEC 1.019 07/17/2019 1635   PHURINE 5.0 07/17/2019 1635   GLUCOSEU NEGATIVE 07/17/2019 1635   HGBUR NEGATIVE 07/17/2019 1635   BILIRUBINUR NEGATIVE 07/17/2019 1635   KETONESUR 5 (A) 07/17/2019 1635   PROTEINUR 30 (A) 07/17/2019 1635   UROBILINOGEN 0.2 04/28/2012 1830   NITRITE NEGATIVE 07/17/2019 1635   LEUKOCYTESUR SMALL (A) 07/17/2019 1635   Sepsis Labs: @LABRCNTIP (procalcitonin:4,lacticidven:4) ) Recent Results (from the past 240 hour(s))  Blood Culture (routine x 2)     Status: None (Preliminary result)   Collection Time: 07/17/19  5:01 PM   Specimen: BLOOD  Result Value Ref Range Status   Specimen Description BLOOD LEFT ANTECUBITAL  Final   Special Requests   Final    BOTTLES DRAWN AEROBIC AND ANAEROBIC Blood Culture adequate volume   Culture   Final    NO GROWTH < 24 HOURS Performed at [facility], [address] [place], [place]    Report Status PENDING  Incomplete  Blood Culture (routine x 2)     Status: None (Preliminary result)   Collection Time: 07/17/19  5:02 PM   Specimen: BLOOD    Result Value Ref Range Status   Specimen Description BLOOD RIGHT ANTECUBITAL  Final   Special Requests   Final    BOTTLES DRAWN AEROBIC AND ANAEROBIC Blood Culture adequate volume   Culture   Final    NO GROWTH < 24 HOURS Performed at [facility], [address], [place], [place]    Report Status PENDING  Incomplete  Respiratory Panel by RT PCR (Flu A&B, Covid) - Nasopharyngeal Swab     Status: Abnormal   Collection Time: 07/17/19  6:25 PM   Specimen: Nasopharyngeal Swab  Result Value Ref Range Status   SARS Coronavirus 2 by RT PCR POSITIVE (A) NEGATIVE Final    Comment: RESULT CALLED TO, READ BACK BY AND VERIFIED WITH: H CRAWFORD,RN @2040  07/17/19 MKELLY (NOTE) SARS-CoV-2 target nucleic acids are DETECTED. SARS-CoV-2 RNA is generally detectable in upper respiratory specimens  during the acute phase of infection. Positive results are indicative of the presence of the identified virus, but do not rule out bacterial infection or co-infection with other pathogens not detected by the test. Clinical correlation with patient  history and other diagnostic information is necessary to determine patient infection status. The expected result is Negative. Fact Sheet for Patients:  https://www.moore.com/ Fact Sheet for Healthcare Providers: https://www.young.biz/ This test is not yet approved or cleared by the [place] FDA and  has been authorized for detection and/or diagnosis of SARS-CoV-2 by FDA under an Emergency Use Authorization (EUA).  This EUA will remain in effect (meaning this test can be used) f or the duration of  the COVID-19 declaration under Section 564(b)(1) of the Act, 21 U.S.C. section 360bbb-3(b)(1), unless the authorization is terminated or revoked sooner.    Influenza A by PCR NEGATIVE NEGATIVE Final   Influenza B by PCR NEGATIVE NEGATIVE Final    Comment: (NOTE) The Xpert Xpress SARS-CoV-2/FLU/RSV assay is intended  as an aid in  the diagnosis of influenza from Nasopharyngeal swab specimens and  should not be used as a sole basis for treatment. Nasal washings and  aspirates are unacceptable for Xpert Xpress SARS-CoV-2/FLU/RSV  testing. Fact Sheet for Patients: https://www.moore.com/ Fact Sheet for Healthcare Providers: https://www.young.biz/ This test is not yet approved or cleared by the [place] FDA and  has been authorized for detection and/or diagnosis of SARS-CoV-2 by  FDA under an Emergency Use Authorization (EUA). This EUA will remain  in effect (meaning this test can be used) for the duration of the  Covid-19 declaration under Section 564(b)(1) of the Act, 21  U.S.C. section 360bbb-3(b)(1), unless the authorization is  terminated or revoked. Performed at [facility], [address] [place], [place] [address]   MRSA PCR Screening     Status: Abnormal   Collection Time: 07/18/19 12:47 AM   Specimen: Nasal Mucosa; Nasopharyngeal  Result Value Ref Range Status   MRSA by PCR POSITIVE (A) NEGATIVE Final    Comment:        The GeneXpert MRSA Assay (FDA approved for NASAL specimens only), is one component of a comprehensive MRSA colonization surveillance program. It is not intended to diagnose MRSA infection nor to guide or monitor treatment for MRSA infections. RESULT CALLED TO, READ BACK BY AND VERIFIED WITH: [place] @0521  07/18/19 [facility] Performed at [facility], [address], [place], [place]      Scheduled Meds: . vitamin C  500 mg Oral Daily  . aspirin EC  81 mg Oral Daily  . calcium-vitamin D  1 tablet Oral BID  . Chlorhexidine Gluconate Cloth  6 each Topical Daily  . digoxin  0.125 mg Oral QPM  . docusate sodium  100 mg Oral QHS  . gabapentin  300 mg Oral QHS  . guaiFENesin  600 mg Oral Daily  . heparin  5,000 Units Subcutaneous Q8H  . levETIRAcetam  250 mg Oral Daily   And  . levETIRAcetam  125 mg Oral Q24H  .  levothyroxine  25 mcg Oral Q0600  . Melatonin  4.5 mg Oral QHS  . midodrine  2.5 mg Oral BID WC  . montelukast  10 mg Oral Daily  . multivitamin-lutein  1 capsule Oral BID  . mupirocin ointment  1 application Nasal BID  . pantoprazole  40 mg Oral Daily  . potassium chloride  10 mEq Oral Daily  . sertraline  25 mg Oral Daily  . sodium chloride flush  3 mL Intravenous Q12H  . zinc sulfate  220 mg Oral Daily   Continuous Infusions: . sodium chloride    . sodium chloride    . cefTRIAXone (ROCEPHIN)  IV 1 g (07/18/19  0518)  . lactated ringers 75 mL/hr at 07/17/19 2230    Procedures/Studies: DG Chest Port 1 View  Result Date: 07/17/2019 CLINICAL DATA:  Weakness, COVID positive EXAM: PORTABLE CHEST 1 VIEW COMPARISON:  09/14/2017 FINDINGS: Cardiomegaly with aortic atherosclerosis. Trace left pleural effusion or thickening. No focal airspace disease. No pneumothorax. IMPRESSION: 1. Trace left pleural effusion or pleural thickening. 2. Cardiomegaly without edema. 3. No acute focal airspace disease Electronically Signed   By: Donavan Foil M.D.   On: 07/17/2019 18:31    Orson Eva, DO  [facility]  If 7PM-7AM, please contact night-coverage [email or web address] Password [email or web address] 07/18/2019, 10:00 AM   LOS: 1 day

## 2019-07-18 NOTE — Plan of Care (Signed)
  Problem: Acute Rehab PT Goals(only PT should resolve) Goal: Pt Will Go Supine/Side To Sit Outcome: Progressing Flowsheets (Taken 07/18/2019 1410) Pt will go Supine/Side to Sit:  with minimal assist  with min guard assist Goal: Patient Will Transfer Sit To/From Stand Outcome: Progressing Flowsheets (Taken 07/18/2019 1410) Patient will transfer sit to/from stand: with minimal assist Goal: Pt Will Transfer Bed To Chair/Chair To Bed Outcome: Progressing Flowsheets (Taken 07/18/2019 1410) Pt will Transfer Bed to Chair/Chair to Bed: with min assist Goal: Pt Will Ambulate Outcome: Progressing Flowsheets (Taken 07/18/2019 1410) Pt will Ambulate:  25 feet  with minimal assist  with rolling walker   2:10 PM, 07/18/19 Ocie Bob, MPT Physical Therapist with [facility] [phone] office [phone] mobile phone

## 2019-07-19 ENCOUNTER — Inpatient Hospital Stay (HOSPITAL_COMMUNITY): Payer: Medicare Other

## 2019-07-19 ENCOUNTER — Encounter (HOSPITAL_COMMUNITY): Payer: Self-pay | Admitting: Internal Medicine

## 2019-07-19 DIAGNOSIS — R569 Unspecified convulsions: Secondary | ICD-10-CM

## 2019-07-19 LAB — FERRITIN: Ferritin: 575 ng/mL — ABNORMAL HIGH (ref 11–307)

## 2019-07-19 LAB — COMPREHENSIVE METABOLIC PANEL
ALT: 22 U/L (ref 0–44)
AST: 39 U/L (ref 15–41)
Albumin: 2.8 g/dL — ABNORMAL LOW (ref 3.5–5.0)
Alkaline Phosphatase: 59 U/L (ref 38–126)
Anion gap: 8 (ref 5–15)
BUN: 25 mg/dL — ABNORMAL HIGH (ref 8–23)
CO2: 24 mmol/L (ref 22–32)
Calcium: 8.4 mg/dL — ABNORMAL LOW (ref 8.9–10.3)
Chloride: 108 mmol/L (ref 98–111)
Creatinine, Ser: 0.65 mg/dL (ref 0.44–1.00)
GFR calc Af Amer: >60 mL/min (ref >60)
GFR calc non Af Amer: >60 mL/min (ref >60)
Glucose, Bld: 100 mg/dL — ABNORMAL HIGH (ref 70–99)
Potassium: 4 mmol/L (ref 3.5–5.1)
Sodium: 140 mmol/L (ref 135–145)
Total Bilirubin: 0.5 mg/dL (ref 0.3–1.2)
Total Protein: 5.2 g/dL — ABNORMAL LOW (ref 6.5–8.1)

## 2019-07-19 LAB — URINE CULTURE: Culture: 60000 — AB

## 2019-07-19 LAB — MAGNESIUM: Magnesium: 2.1 mg/dL (ref 1.7–2.4)

## 2019-07-19 LAB — CBC
HCT: 39.5 % (ref 36.0–46.0)
Hemoglobin: 12.4 g/dL (ref 12.0–15.0)
MCH: 29.2 pg (ref 26.0–34.0)
MCHC: 31.4 g/dL (ref 30.0–36.0)
MCV: 92.9 fL (ref 80.0–100.0)
Platelets: 117 10*3/uL — ABNORMAL LOW (ref 150–400)
RBC: 4.25 MIL/uL (ref 3.87–5.11)
RDW: 15.5 % (ref 11.5–15.5)
WBC: 3 10*3/uL — ABNORMAL LOW (ref 4.0–10.5)
nRBC: 0 % (ref 0.0–0.2)

## 2019-07-19 LAB — PHOSPHORUS: Phosphorus: 2.1 mg/dL — ABNORMAL LOW (ref 2.5–4.6)

## 2019-07-19 LAB — C-REACTIVE PROTEIN: CRP: 16.4 mg/dL — ABNORMAL HIGH (ref <1.0)

## 2019-07-19 LAB — D-DIMER, QUANTITATIVE: D-Dimer, Quant: 5.86 ug/mL-FEU — ABNORMAL HIGH (ref 0.00–0.50)

## 2019-07-19 MED ORDER — DILTIAZEM HCL 30 MG PO TABS
30.0000 mg | ORAL_TABLET | Freq: Four times a day (QID) | ORAL | Status: DC
  Administered 2019-07-19 – 2019-07-21 (×8): 30 mg via ORAL
  Filled 2019-07-19 (×8): qty 1

## 2019-07-19 MED ORDER — DIPHENHYDRAMINE HCL 25 MG PO CAPS: 50.0000 mg | ORAL_CAPSULE | Freq: Once | ORAL | Status: DC

## 2019-07-19 MED ORDER — HYDROCORTISONE NA SUCCINATE PF 100 MG IJ SOLR
200.0000 mg | Freq: Once | INTRAMUSCULAR | Status: AC
  Administered 2019-07-19: 200 mg via INTRAVENOUS
  Filled 2019-07-19: qty 4

## 2019-07-19 MED ORDER — DIPHENHYDRAMINE HCL 50 MG/ML IJ SOLN
50.0000 mg | Freq: Once | INTRAMUSCULAR | Status: AC
  Administered 2019-07-19: 50 mg via INTRAVENOUS
  Filled 2019-07-19: qty 1

## 2019-07-19 MED ORDER — PREDNISONE 20 MG PO TABS: 50.0000 mg | ORAL_TABLET | Freq: Four times a day (QID) | ORAL | Status: DC

## 2019-07-19 MED ORDER — DIPHENHYDRAMINE HCL 50 MG/ML IJ SOLN: 50.0000 mg | Freq: Once | INTRAMUSCULAR | Status: DC

## 2019-07-19 MED ORDER — SODIUM CHLORIDE 0.9 % IV SOLN
1.5000 g | Freq: Three times a day (TID) | INTRAVENOUS | Status: DC
  Administered 2019-07-19 – 2019-07-21 (×7): 1.5 g via INTRAVENOUS
  Filled 2019-07-19 (×13): qty 4

## 2019-07-19 MED ORDER — DIPHENHYDRAMINE HCL 25 MG PO CAPS: 50.0000 mg | ORAL_CAPSULE | Freq: Once | ORAL | Status: AC

## 2019-07-19 MED ORDER — LACTATED RINGERS IV SOLN: INTRAVENOUS | Status: AC

## 2019-07-19 MED ORDER — IOHEXOL 350 MG/ML SOLN
75.0000 mL | Freq: Once | INTRAVENOUS | Status: AC | PRN
  Administered 2019-07-19: 75 mL via INTRAVENOUS

## 2019-07-19 NOTE — Progress Notes (Signed)
PROGRESS NOTE  Deborah [name] EGB:[id number] DOB: 05/20/1926 DOA: 07/17/2019 PCP: Marlowe Aschoff, FNP  Brief History:  86 year old female with a history of Alzheimer's dementia, coronary disease, permanent atrial fibrillation, diastolic CHF, hypertension, hyperlipidemia, seizure order presenting with 1 day history of generalized weakness, nausea and vomiting that started on 07/16/2019 evening and continued through breakfast on 07/17/2019.  In addition, the patient was noted to be more confused than her usual baseline.  Unfortunately, the patient is unable to provide history secondary to her dementia.  Apparently, the patient also was noted to have a fever at [facility].  She was diagnosed with COVID-19 on 07/01/2019.  The patient herself denies any chest pain, coughing, hemoptysis, diarrhea, abdominal pain, hematochezia, melena. In the emergency department, the patient had a fever up to 101.0 F with tachycardia and soft blood pressures with systolic BP in the upper 80s and low 90s.  Oxygen saturation was 100% 2 L.  The patient was started on IV fluids and ceftriaxone pending culture data.  Assessment/Plan: Sepsis -present on admission -Secondary to UTI -Lactic acid peaked 2.1>>>less than 0.3 -Procalcitonin 0.49 -Continue IV fluids -Change ceftriaxone to Unasyn -Follow blood and urine cultures -BPs remain soft  UTI--Aerococcus urinae -UA 21-50WBC -discontinue ceftriaxone -start Unasyn  COVID-19 disease/gastroenteritis -Continue to trend inflammatory markers -Personally view chest x-ray--left pleural thickening, no consolidation or edema -CRP 13.8>>>10.9>>16.4 -Ferritin 626>>>610>>575 -D-dimer 9.16>>5.86 -vomiting improved--start full liquids  Elevated D-dimer -CTA chest  Acute kidney injury -Secondary to sepsis and volume depletion -Baseline creatinine 0.5-2.6 -Serum creatinine peaked 1.05 -Continue IV fluids -holding mobic  Permanent atrial fibrillation with  RVR -Rate is slightly elevated secondary to sepsis -Not an anticoagulation candidate secondary to GI bleed and falls -Continue aspirin -continue digoxin -holding cardizem due to soft BPs  Chronic diastolic CHF -The patient appears hypovolemic presently -Judicious IV fluids -holding lasix due to volume depletion  Hypothyroidism -Continue Synthroid  Dementia without behavioral disturbance -continue zoloft  Essential Hypertension -holding cardizem, imdur due to soft BPs  Seizure disorder -continue Keppra     Disposition Plan: Patient From: [facility] D/C Place: [facility] - 2-3  Days Barriers: Not Clinically Stable--hypotension, on IV abx  Family Communication:   No Family at bedside  Consultants:  none  Code Status:  DNR  DVT Prophylaxis:  [place] Heparin    Procedures: As Listed in Progress Note Above  Antibiotics: Cefepime 2/24 Ceftriaxone 2/25>>2/26 Unasyn 2/26>>>        Subjective: Pt is pleasantly confused.  Denies f/c, cp, sob, abd pain, n/v/.  Having multiple BMs without diarrhea.    Objective: Vitals:   07/19/19 0900 07/19/19 1000 07/19/19 1100 07/19/19 1200  BP: 97/66 115/84 (!) 87/72   Pulse: 83 73 (!) 139 (!) 119  Resp: 16 18 (!) 24 (!) 21  Temp:      TempSrc:      SpO2: 100% 100% 90% 94%  Weight:      Height:        Intake/Output Summary (Last 24 hours) at 07/19/2019 1441 Last data filed at 07/19/2019 1212 Gross per 24 hour  Intake 1140 ml  Output --  Net 1140 ml   Weight change: 0.6 kg Exam:   General:  Pt is alert, follows commands appropriately, not in acute distress. Pleasantly confused  HEENT: No icterus, No thrush, No neck mass, [place]/AT  Cardiovascular: RRR, S1/S2, no rubs, no gallops  Respiratory:bilateral crackles.  diminished BS bilateral. No wheeze  Abdomen: Soft/+BS,  non tender, non distended, no guarding  Extremities: No edema, No lymphangitis, No petechiae, No rashes, no synovitis   Data  Reviewed: I have personally reviewed following labs and imaging studies Basic Metabolic Panel: Recent Labs  Lab 07/17/19 1701 07/18/19 0519 07/19/19 0613  NA 136 138 140  K 4.3 3.9 4.0  CL 99 109 108  CO2 27 21* 24  GLUCOSE 112* 99 100*  BUN 29* 28* 25*  CREATININE 1.05* 0.80 0.65  CALCIUM 9.1 7.8* 8.4*  MG  --  2.0 2.1  PHOS  --  2.4* 2.1*   Liver Function Tests: Recent Labs  Lab 07/17/19 1701 07/19/19 0613  AST 32 39  ALT 18 22  ALKPHOS 71 59  BILITOT 0.6 0.5  PROT 6.8 5.2*  ALBUMIN 3.8 2.8*   Recent Labs  Lab 07/17/19 1701  LIPASE 27   No results for input(s): AMMONIA in the last 168 hours. Coagulation Profile: Recent Labs  Lab 07/17/19 1701  INR 1.1   CBC: Recent Labs  Lab 07/17/19 1701 07/18/19 0606 07/19/19 0613  WBC 5.8 4.4 3.0*  NEUTROABS 5.3  --   --   HGB 15.3* 15.2* 12.4  HCT 48.5* 50.0* 39.5  MCV 92.6 98.2 92.9  PLT 150 119* 117*   Cardiac Enzymes: No results for input(s): CKTOTAL, CKMB, CKMBINDEX, TROPONINI in the last 168 hours. BNP: Invalid input(s): POCBNP CBG: No results for input(s): GLUCAP in the last 168 hours. HbA1C: No results for input(s): HGBA1C in the last 72 hours. Urine analysis:    Component Value Date/Time   COLORURINE YELLOW 07/17/2019 1635   APPEARANCEUR HAZY (A) 07/17/2019 1635   LABSPEC 1.019 07/17/2019 1635   PHURINE 5.0 07/17/2019 1635   GLUCOSEU NEGATIVE 07/17/2019 1635   HGBUR NEGATIVE 07/17/2019 1635   BILIRUBINUR NEGATIVE 07/17/2019 1635   KETONESUR 5 (A) 07/17/2019 1635   PROTEINUR 30 (A) 07/17/2019 1635   UROBILINOGEN 0.2 04/28/2012 1830   NITRITE NEGATIVE 07/17/2019 1635   LEUKOCYTESUR SMALL (A) 07/17/2019 1635   Sepsis Labs: @LABRCNTIP (procalcitonin:4,lacticidven:4) ) Recent Results (from the past 240 hour(s))  Urine culture     Status: Abnormal   Collection Time: 07/17/19  4:47 PM   Specimen: In/Out Cath Urine  Result Value Ref Range Status   Specimen Description   Final    IN/OUT CATH  URINE Performed at [facility], [address], [place], [place]    Special Requests   Final    NONE Performed at [facility], [address], [place], [place]    Culture (A)  Final    60,000 COLONIES/mL AEROCOCCUS URINAE Standardized susceptibility testing for this organism is not available. Performed at [facility], [address], [place], [place]    Report Status 07/19/2019 FINAL  Final  Blood Culture (routine x 2)     Status: None (Preliminary result)   Collection Time: 07/17/19  5:01 PM   Specimen: BLOOD  Result Value Ref Range Status   Specimen Description BLOOD LEFT ANTECUBITAL  Final   Special Requests   Final    BOTTLES DRAWN AEROBIC AND ANAEROBIC Blood Culture adequate volume   Culture   Final    NO GROWTH 2 DAYS Performed at [facility], [address] [place], [place]    Report Status PENDING  Incomplete  Blood Culture (routine x 2)     Status: None (Preliminary result)   Collection Time: 07/17/19  5:02 PM   Specimen: BLOOD  Result Value  Ref Range Status   Specimen Description BLOOD RIGHT ANTECUBITAL  Final   Special Requests   Final    BOTTLES DRAWN AEROBIC AND ANAEROBIC Blood Culture adequate volume   Culture   Final    NO GROWTH 2 DAYS Performed at [facility], [address], [place], [place] [address]    Report Status PENDING  Incomplete  Respiratory Panel by RT PCR (Flu A&B, Covid) - Nasopharyngeal Swab     Status: Abnormal   Collection Time: 07/17/19  6:25 PM   Specimen: Nasopharyngeal Swab  Result Value Ref Range Status   SARS Coronavirus 2 by RT PCR POSITIVE (A) NEGATIVE Final    Comment: RESULT CALLED TO, READ BACK BY AND VERIFIED WITH: H CRAWFORD,RN @2040  07/17/19 MKELLY (NOTE) SARS-CoV-2 target nucleic acids are DETECTED. SARS-CoV-2 RNA is generally detectable in upper respiratory specimens  during the acute phase of infection. Positive results are indicative of the presence of the identified  virus, but do not rule out bacterial infection or co-infection with other pathogens not detected by the test. Clinical correlation with patient history and other diagnostic information is necessary to determine patient infection status. The expected result is Negative. Fact Sheet for Patients:  07/19/19 Fact Sheet for Healthcare Providers: https://www.moore.com/ This test is not yet approved or cleared by the https://www.young.biz/ FDA and  has been authorized for detection and/or diagnosis of SARS-CoV-2 by FDA under an Emergency Use Authorization (EUA).  This EUA will remain in effect (meaning this test can be used) f or the duration of  the COVID-19 declaration under Section 564(b)(1) of the Act, 21 U.S.C. section 360bbb-3(b)(1), unless the authorization is terminated or revoked sooner.    Influenza A by PCR NEGATIVE NEGATIVE Final   Influenza B by PCR NEGATIVE NEGATIVE Final    Comment: (NOTE) The Xpert Xpress SARS-CoV-2/FLU/RSV assay is intended as an aid in  the diagnosis of influenza from Nasopharyngeal swab specimens and  should not be used as a sole basis for treatment. Nasal washings and  aspirates are unacceptable for Xpert Xpress SARS-CoV-2/FLU/RSV  testing. Fact Sheet for Patients: [place] Fact Sheet for Healthcare Providers: https://www.moore.com/ This test is not yet approved or cleared by the https://www.young.biz/ FDA and  has been authorized for detection and/or diagnosis of SARS-CoV-2 by  FDA under an Emergency Use Authorization (EUA). This EUA will remain  in effect (meaning this test can be used) for the duration of the  Covid-19 declaration under Section 564(b)(1) of the Act, 21  U.S.C. section 360bbb-3(b)(1), unless the authorization is  terminated or revoked. Performed at [facility], [address], [place], [place]   MRSA PCR Screening     Status: Abnormal    Collection Time: 07/18/19 12:47 AM   Specimen: Nasal Mucosa; Nasopharyngeal  Result Value Ref Range Status   MRSA by PCR POSITIVE (A) NEGATIVE Final    Comment:        The GeneXpert MRSA Assay (FDA approved for NASAL specimens only), is one component of a comprehensive MRSA colonization surveillance program. It is not intended to diagnose MRSA infection nor to guide or monitor treatment for MRSA infections. RESULT CALLED TO, READ BACK BY AND VERIFIED WITH: 07/20/19 @0521  07/18/19 [facility] Performed at [facility], [address], [place], [address] [place]      Scheduled Meds: . vitamin C  500 mg Oral Daily  . aspirin EC  81 mg Oral Daily  . calcium-vitamin D  1 tablet Oral BID  . Chlorhexidine  Gluconate Cloth  6 each Topical Daily  . digoxin  0.125 mg Oral QPM  . docusate sodium  100 mg Oral QHS  . gabapentin  300 mg Oral QHS  . guaiFENesin  600 mg Oral Daily  . heparin  5,000 Units Subcutaneous Q8H  . levETIRAcetam  250 mg Oral Daily   And  . levETIRAcetam  125 mg Oral Q24H  . levothyroxine  25 mcg Oral Q0600  . Melatonin  4.5 mg Oral QHS  . midodrine  2.5 mg Oral BID WC  . montelukast  10 mg Oral Daily  . multivitamin-lutein  1 capsule Oral BID  . mupirocin ointment  1 application Nasal BID  . pantoprazole  40 mg Oral Daily  . potassium chloride  10 mEq Oral Daily  . sertraline  25 mg Oral Daily  . sodium chloride flush  3 mL Intravenous Q12H  . zinc sulfate  220 mg Oral Daily   Continuous Infusions: . sodium chloride    . sodium chloride    . ampicillin-sulbactam (UNASYN) IV 1.5 g (07/19/19 1134)  . lactated ringers 75 mL/hr at 07/17/19 2230    Procedures/Studies: DG Chest Port 1 View  Result Date: 07/17/2019 CLINICAL DATA:  Weakness, COVID positive EXAM: PORTABLE CHEST 1 VIEW COMPARISON:  09/14/2017 FINDINGS: Cardiomegaly with aortic atherosclerosis. Trace left pleural effusion or thickening. No focal airspace disease. No pneumothorax. IMPRESSION: 1. Trace  left pleural effusion or pleural thickening. 2. Cardiomegaly without edema. 3. No acute focal airspace disease Electronically Signed   By: Donavan Foil M.D.   On: 07/17/2019 18:31    Orson Eva, DO  [facility]  If 7PM-7AM, please contact night-coverage [email or web address] Password [email or web address] 07/19/2019, 2:41 PM   LOS: 2 days

## 2019-07-20 DIAGNOSIS — I5032 Chronic diastolic (congestive) heart failure: Secondary | ICD-10-CM

## 2019-07-20 DIAGNOSIS — I2699 Other pulmonary embolism without acute cor pulmonale: Secondary | ICD-10-CM

## 2019-07-20 LAB — COMPREHENSIVE METABOLIC PANEL
ALT: 21 U/L (ref 0–44)
AST: 30 U/L (ref 15–41)
Albumin: 2.7 g/dL — ABNORMAL LOW (ref 3.5–5.0)
Alkaline Phosphatase: 62 U/L (ref 38–126)
Anion gap: 6 (ref 5–15)
BUN: 19 mg/dL (ref 8–23)
CO2: 27 mmol/L (ref 22–32)
Calcium: 8.4 mg/dL — ABNORMAL LOW (ref 8.9–10.3)
Chloride: 107 mmol/L (ref 98–111)
Creatinine, Ser: 0.61 mg/dL (ref 0.44–1.00)
GFR calc Af Amer: >60 mL/min (ref >60)
GFR calc non Af Amer: >60 mL/min (ref >60)
Glucose, Bld: 102 mg/dL — ABNORMAL HIGH (ref 70–99)
Potassium: 4.3 mmol/L (ref 3.5–5.1)
Sodium: 140 mmol/L (ref 135–145)
Total Bilirubin: 0.5 mg/dL (ref 0.3–1.2)
Total Protein: 5.2 g/dL — ABNORMAL LOW (ref 6.5–8.1)

## 2019-07-20 LAB — CBC
HCT: 41.4 % (ref 36.0–46.0)
Hemoglobin: 12.9 g/dL (ref 12.0–15.0)
MCH: 29.4 pg (ref 26.0–34.0)
MCHC: 31.2 g/dL (ref 30.0–36.0)
MCV: 94.3 fL (ref 80.0–100.0)
Platelets: 118 10*3/uL — ABNORMAL LOW (ref 150–400)
RBC: 4.39 MIL/uL (ref 3.87–5.11)
RDW: 15.3 % (ref 11.5–15.5)
WBC: 4.9 10*3/uL (ref 4.0–10.5)
nRBC: 0 % (ref 0.0–0.2)

## 2019-07-20 LAB — D-DIMER, QUANTITATIVE: D-Dimer, Quant: 3.74 ug/mL-FEU — ABNORMAL HIGH (ref 0.00–0.50)

## 2019-07-20 LAB — FERRITIN: Ferritin: 471 ng/mL — ABNORMAL HIGH (ref 11–307)

## 2019-07-20 LAB — C-REACTIVE PROTEIN: CRP: 11.5 mg/dL — ABNORMAL HIGH (ref <1.0)

## 2019-07-20 LAB — MAGNESIUM: Magnesium: 2.2 mg/dL (ref 1.7–2.4)

## 2019-07-20 LAB — PHOSPHORUS: Phosphorus: 2.8 mg/dL (ref 2.5–4.6)

## 2019-07-20 MED ORDER — APIXABAN 5 MG PO TABS
10.0000 mg | ORAL_TABLET | Freq: Two times a day (BID) | ORAL | Status: DC
  Administered 2019-07-20 – 2019-07-21 (×3): 10 mg via ORAL
  Filled 2019-07-20 (×2): qty 2

## 2019-07-20 MED ORDER — APIXABAN 5 MG PO TABS: 5.0000 mg | ORAL_TABLET | Freq: Two times a day (BID) | ORAL | Status: DC

## 2019-07-20 MED ORDER — SODIUM CHLORIDE 0.9 % IV SOLN: INTRAVENOUS | Status: DC

## 2019-07-20 NOTE — Progress Notes (Signed)
PROGRESS NOTE  Deborah [name] KGU:[id number] DOB: 11-07-1925 DOA: 07/17/2019 PCP: Lynnell Catalan, FNP  Brief History: 86 year old female with a history of Alzheimer's dementia, coronary disease, permanent atrial fibrillation, diastolic CHF, hypertension, hyperlipidemia, seizure order presenting with 1 day history of generalized weakness, nausea and vomiting that started on 07/16/2019 evening and continued through breakfast on 07/17/2019. In addition, the patient was noted to be more confused than her usual baseline. Unfortunately, the patient is unable to provide history secondary to her dementia. Apparently, the patient also was noted to have a fever at [facility]. She was diagnosed with COVID-19 on 07/01/2019. The patient herself denies any chest pain, coughing, hemoptysis, diarrhea, abdominal pain, hematochezia, melena. In the emergency department, the patient had a fever up to 101.0 F with tachycardia and soft blood pressures with systolic BP in the upper 62G and low 90s. Oxygen saturation was 100% 2 L. The patient was started on IV fluids and ceftriaxone pending culture data.  Assessment/Plan: Sepsis -present on admission -Secondary to UTI -Lactic acid peaked 2.1>>>less than 0.3 -Procalcitonin 0.49 -Continue IV fluids -Change ceftriaxone to Unasyn -Follow blood and urine cultures -BPs remain soft  UTI--Aerococcus urinae -[id number] -discontinue ceftriaxone -continue Unasyn  COVID-19 disease/gastroenteritis -Continue to trend inflammatory markers -Personally view chest x-ray--left pleural thickening, no consolidation or edema -CRP 13.8>>>10.9>>16.4>>11.5 -Ferritin 626>>>610>>575>>471 -D-dimer 9.16>>5.86>>3.74 -vomiting improved--start full liquids>>regular diet  Acute PE -CTA chest-->+single subsegmental RLL PE; trace bilateral pleural effusion -Echo  Acute kidney injury -Secondary to sepsis and volume depletion -Baseline creatinine 0.5-2.6 -Serum  creatinine peaked 1.05 -Continue IV fluids -holding mobic  Permanent atrial fibrillation with RVR -Rate is slightly elevated secondary to sepsis -Not an anticoagulation candidate secondary to GI bleed and falls -Continue aspirin -continue digoxin -restarted diltiazem  Chronic diastolic CHF -The patient appears hypovolemic presently -Judicious IV fluids -holding lasix due to volume depletion  Hypothyroidism -Continue Synthroid  Dementia without behavioral disturbance -continue zoloft  Essential Hypertension -holding imdur due to soft BPs -restarted diltiazem  Seizure disorder -continue Keppra     Disposition Plan: Patient From:[facility] D/C Place:[facility]- 1-2 Days Barriers: Not Clinically Stable--hypotension, on IV abx  Family Communication:NoFamily at bedside  Consultants:none  Code Status: DNR  DVT Prophylaxis: [place] Heparin    Procedures: As Listed in Progress Note Above  Antibiotics: Cefepime 2/24 Ceftriaxone 2/25>>2/26 Unasyn 2/26>>>   Subjective: Patient denies fevers, chills, headache, chest pain, dyspnea, nausea, vomiting, diarrhea, abdominal pain. She is pleasantly confused   Objective: Vitals:   07/20/19 1136 07/20/19 1145 07/20/19 1200 07/20/19 1300  BP: 101/70  102/71 (!) 85/57  Pulse:  70 (!) 43 69  Resp:  15 (!) 25 19  Temp:  97.6 F (36.4 C)    TempSrc:  Oral    SpO2:  99% 100% 100%  Weight:      Height:        Intake/Output Summary (Last 24 hours) at 07/20/2019 1643 Last data filed at 07/20/2019 1422 Gross per 24 hour  Intake 2182.5 ml  Output 1400 ml  Net 782.5 ml   Weight change: 0.1 kg Exam:   General:  Pt is alert, follows commands appropriately, not in acute distress  HEENT: No icterus, No thrush, No neck mass, [place]/AT  Cardiovascular: RRR, S1/S2, no rubs, no gallops  Respiratory: bibasilar crackles. No wheeze  Abdomen: Soft/+BS, non tender, non distended, no  guarding  Extremities: No edema, No lymphangitis, No petechiae, No rashes, no synovitis   Data Reviewed: I have  personally reviewed following labs and imaging studies Basic Metabolic Panel: Recent Labs  Lab 07/17/19 1701 07/18/19 0519 07/19/19 0613 07/20/19 0436  NA 136 138 140 140  K 4.3 3.9 4.0 4.3  CL 99 109 108 107  CO2 27 21* 24 27  GLUCOSE 112* 99 100* 102*  BUN 29* 28* 25* 19  CREATININE 1.05* 0.80 0.65 0.61  CALCIUM 9.1 7.8* 8.4* 8.4*  MG  --  2.0 2.1 2.2  PHOS  --  2.4* 2.1* 2.8   Liver Function Tests: Recent Labs  Lab 07/17/19 1701 07/19/19 0613 07/20/19 0436  AST 32 39 30  ALT 18 22 21   ALKPHOS 71 59 62  BILITOT 0.6 0.5 0.5  PROT 6.8 5.2* 5.2*  ALBUMIN 3.8 2.8* 2.7*   Recent Labs  Lab 07/17/19 1701  LIPASE 27   No results for input(s): AMMONIA in the last 168 hours. Coagulation Profile: Recent Labs  Lab 07/17/19 1701  INR 1.1   CBC: Recent Labs  Lab 07/17/19 1701 07/18/19 0606 07/19/19 0613 07/20/19 0436  WBC 5.8 4.4 3.0* 4.9  NEUTROABS 5.3  --   --   --   HGB 15.3* 15.2* 12.4 12.9  HCT 48.5* 50.0* 39.5 41.4  MCV 92.6 98.2 92.9 94.3  PLT 150 119* 117* 118*   Cardiac Enzymes: No results for input(s): CKTOTAL, CKMB, CKMBINDEX, TROPONINI in the last 168 hours. BNP: Invalid input(s): POCBNP CBG: No results for input(s): GLUCAP in the last 168 hours. HbA1C: No results for input(s): HGBA1C in the last 72 hours. Urine analysis:    Component Value Date/Time   COLORURINE YELLOW 07/17/2019 1635   APPEARANCEUR HAZY (A) 07/17/2019 1635   LABSPEC 1.019 07/17/2019 1635   PHURINE 5.0 07/17/2019 1635   GLUCOSEU NEGATIVE 07/17/2019 1635   HGBUR NEGATIVE 07/17/2019 1635   BILIRUBINUR NEGATIVE 07/17/2019 1635   KETONESUR 5 (A) 07/17/2019 1635   PROTEINUR 30 (A) 07/17/2019 1635   UROBILINOGEN 0.2 04/28/2012 1830   NITRITE NEGATIVE 07/17/2019 1635   LEUKOCYTESUR SMALL (A) 07/17/2019 1635   Sepsis  Labs: @LABRCNTIP (procalcitonin:4,lacticidven:4) ) Recent Results (from the past 240 hour(s))  Urine culture     Status: Abnormal   Collection Time: 07/17/19  4:47 PM   Specimen: In/Out Cath Urine  Result Value Ref Range Status   Specimen Description   Final    IN/OUT CATH URINE Performed at [facility], [address], [place], [address] [place]    Special Requests   Final    NONE Performed at [facility], [address], [place], [address] [place]    Culture (A)  Final    60,000 COLONIES/mL AEROCOCCUS URINAE Standardized susceptibility testing for this organism is not available. Performed at [facility], [address], [place], [address] [place]    Report Status 07/19/2019 FINAL  Final  Blood Culture (routine x 2)     Status: None (Preliminary result)   Collection Time: 07/17/19  5:01 PM   Specimen: BLOOD  Result Value Ref Range Status   Specimen Description BLOOD LEFT ANTECUBITAL  Final   Special Requests   Final    BOTTLES DRAWN AEROBIC AND ANAEROBIC Blood Culture adequate volume   Culture   Final    NO GROWTH 3 DAYS Performed at [facility], [address], [place], [address] [place]    Report Status PENDING  Incomplete  Blood Culture (routine x 2)     Status: None (Preliminary result)   Collection Time: 07/17/19  5:02 PM  Specimen: BLOOD  Result Value Ref Range Status   Specimen Description BLOOD RIGHT ANTECUBITAL  Final   Special Requests   Final    BOTTLES DRAWN AEROBIC AND ANAEROBIC Blood Culture adequate volume   Culture   Final    NO GROWTH 3 DAYS Performed at [facility], [address] [place], [place] [address]    Report Status PENDING  Incomplete  Respiratory Panel by RT PCR (Flu A&B, Covid) - Nasopharyngeal Swab     Status: Abnormal   Collection Time: 07/17/19  6:25 PM   Specimen: Nasopharyngeal Swab  Result Value Ref Range Status   SARS Coronavirus 2 by RT PCR POSITIVE (A) NEGATIVE Final    Comment: RESULT CALLED TO, READ BACK BY AND  VERIFIED WITH: H CRAWFORD,RN @2040  07/17/19 MKELLY (NOTE) SARS-CoV-2 target nucleic acids are DETECTED. SARS-CoV-2 RNA is generally detectable in upper respiratory specimens  during the acute phase of infection. Positive results are indicative of the presence of the identified virus, but do not rule out bacterial infection or co-infection with other pathogens not detected by the test. Clinical correlation with patient history and other diagnostic information is necessary to determine patient infection status. The expected result is Negative. Fact Sheet for Patients:  07/19/19 Fact Sheet for Healthcare Providers: https://www.moore.com/ This test is not yet approved or cleared by the https://www.young.biz/ FDA and  has been authorized for detection and/or diagnosis of SARS-CoV-2 by FDA under an Emergency Use Authorization (EUA).  This EUA will remain in effect (meaning this test can be used) f or the duration of  the COVID-19 declaration under Section 564(b)(1) of the Act, 21 U.S.C. section 360bbb-3(b)(1), unless the authorization is terminated or revoked sooner.    Influenza A by PCR NEGATIVE NEGATIVE Final   Influenza B by PCR NEGATIVE NEGATIVE Final    Comment: (NOTE) The Xpert Xpress SARS-CoV-2/FLU/RSV assay is intended as an aid in  the diagnosis of influenza from Nasopharyngeal swab specimens and  should not be used as a sole basis for treatment. Nasal washings and  aspirates are unacceptable for Xpert Xpress SARS-CoV-2/FLU/RSV  testing. Fact Sheet for Patients: [place] Fact Sheet for Healthcare Providers: https://www.moore.com/ This test is not yet approved or cleared by the https://www.young.biz/ FDA and  has been authorized for detection and/or diagnosis of SARS-CoV-2 by  FDA under an Emergency Use Authorization (EUA). This EUA will remain  in effect (meaning this test can be used) for  the duration of the  Covid-19 declaration under Section 564(b)(1) of the Act, 21  U.S.C. section 360bbb-3(b)(1), unless the authorization is  terminated or revoked. Performed at [facility], [address], [place], [place]   MRSA PCR Screening     Status: Abnormal   Collection Time: 07/18/19 12:47 AM   Specimen: Nasal Mucosa; Nasopharyngeal  Result Value Ref Range Status   MRSA by PCR POSITIVE (A) NEGATIVE Final    Comment:        The GeneXpert MRSA Assay (FDA approved for NASAL specimens only), is one component of a comprehensive MRSA colonization surveillance program. It is not intended to diagnose MRSA infection nor to guide or monitor treatment for MRSA infections. RESULT CALLED TO, READ BACK BY AND VERIFIED WITH: 07/20/19 @0521  07/18/19 [facility] Performed at [facility], [address] [place], [address] [place]      Scheduled Meds: . apixaban  10 mg Oral BID   Followed by  . [START ON 07/27/2019] apixaban  5 mg Oral BID  .  vitamin C  500 mg Oral Daily  . aspirin EC  81 mg Oral Daily  . calcium-vitamin D  1 tablet Oral BID  . Chlorhexidine Gluconate Cloth  6 each Topical Daily  . digoxin  0.125 mg Oral QPM  . diltiazem  30 mg Oral Q6H  . docusate sodium  100 mg Oral QHS  . gabapentin  300 mg Oral QHS  . guaiFENesin  600 mg Oral Daily  . levETIRAcetam  250 mg Oral Daily   And  . levETIRAcetam  125 mg Oral Q24H  . levothyroxine  25 mcg Oral Q0600  . Melatonin  4.5 mg Oral QHS  . midodrine  2.5 mg Oral BID WC  . montelukast  10 mg Oral Daily  . multivitamin-lutein  1 capsule Oral BID  . mupirocin ointment  1 application Nasal BID  . pantoprazole  40 mg Oral Daily  . potassium chloride  10 mEq Oral Daily  . sertraline  25 mg Oral Daily  . sodium chloride flush  3 mL Intravenous Q12H  . zinc sulfate  220 mg Oral Daily   Continuous Infusions: . sodium chloride    . ampicillin-sulbactam (UNASYN) IV Stopped (07/20/19 0847)    Procedures/Studies: CT  ANGIO CHEST PE W OR WO CONTRAST  Result Date: 07/19/2019 CLINICAL DATA:  COVID-19 positive, short of breath, elevated D-dimer EXAM: CT ANGIOGRAPHY CHEST WITH CONTRAST TECHNIQUE: Multidetector CT imaging of the chest was performed using the standard protocol during bolus administration of intravenous contrast. Multiplanar CT image reconstructions and MIPs were obtained to evaluate the vascular anatomy. CONTRAST:  72mL OMNIPAQUE IOHEXOL 350 MG/ML SOLN COMPARISON:  07/17/2019 FINDINGS: Cardiovascular: This is a technically adequate evaluation of the pulmonary vasculature. There is a single subsegmental filling defect within the lateral basilar segment right lower lobe, best seen image 177 of series 7. This is of uncertain clinical significance. No other filling defects or pulmonary emboli. The heart is enlarged with prominent biatrial dilatation. Significant atherosclerosis throughout the coronary vessels, with mild calcified plaque at the aortic arch. No evidence of thoracic aortic aneurysm or dissection. There is high-grade stenosis within the proximal left subclavian artery, reference axial image 18 of series 6. Mediastinum/Nodes: No enlarged mediastinal, hilar, or axillary lymph nodes. Thyroid gland, trachea, and esophagus demonstrate no significant findings. Lungs/Pleura: Trace bilateral pleural effusions. There are scattered areas of subpleural consolidation most pronounced in the left upper lobe and right lower lobe, favor atelectasis. No airspace disease or pneumothorax. The central airways are patent. Upper Abdomen: No acute abnormality. Musculoskeletal: No acute or destructive bony lesions. Reconstructed images demonstrate chronic T7 and T8 compression deformities. Review of the MIP images confirms the above findings. IMPRESSION: 1. Single subsegmental right lower lobe pulmonary embolus, of uncertain clinical significance. No other filling defects or pulmonary emboli. 2. Cardiomegaly with prominent biatrial  dilatation. 3. Trace bilateral pleural effusions. Scattered areas of atelectasis. 4. Aortic Atherosclerosis (ICD10-I70.0). Coronary artery atherosclerosis. These results will be called to the ordering clinician or representative by the Radiologist Assistant, and communication documented in the PACS or zVision Dashboard. Electronically Signed   By: Sharlet Salina M.D.   On: 07/19/2019 21:37   DG Chest Port 1 View  Result Date: 07/17/2019 CLINICAL DATA:  Weakness, COVID positive EXAM: PORTABLE CHEST 1 VIEW COMPARISON:  09/14/2017 FINDINGS: Cardiomegaly with aortic atherosclerosis. Trace left pleural effusion or thickening. No focal airspace disease. No pneumothorax. IMPRESSION: 1. Trace left pleural effusion or pleural thickening. 2. Cardiomegaly without edema. 3. No acute focal airspace  disease Electronically Signed   By: Jasmine Pang M.D.   On: 07/17/2019 18:31    [name], DO  [facility]  If 7PM-7AM, please contact night-coverage [email or web address] Password [facility] 07/20/2019, 4:43 PM   LOS: 3 days

## 2019-07-20 NOTE — Progress Notes (Signed)
ANTICOAGULATION CONSULT NOTE - Follow Up Consult  Pharmacy Consult for apixaban Indication: pulmonary embolus  Allergies  Allergen Reactions  . Ivp Dye [Iodinated Diagnostic Agents] Shortness Of Breath    ??  . Omnipaque [Iohexol] Shortness Of Breath and Other (See Comments)    Short of breath with chest tightness after IV injection in CT, pt was fine when she left department but developed symptoms in parking lot and went to emergency department.  . Sulfa Antibiotics Shortness Of Breath  . Amiodarone   . Carbamazepine     REACTION: toxemia  . Dilaudid [Hydromorphone] Nausea And Vomiting  . Tramadol Other (See Comments)    confusion    Patient Measurements: Height: 5\' 5"  (165.1 cm) Weight: 145 lb 8.1 oz (66 kg) IBW/kg (Calculated) : 57  Vital Signs: Temp: 97.6 F (36.4 C) (02/27 1145) Temp Source: Oral (02/27 1145) BP: 101/70 (02/27 1136) Pulse Rate: 70 (02/27 1145)  Labs: Recent Labs    07/17/19 1701 07/17/19 1701 07/17/19 2234 07/18/19 0055 07/18/19 0519 07/18/19 0606 07/18/19 0606 07/19/19 0613 07/20/19 0436  HGB 15.3*   < >  --   --   --  15.2*   < > 12.4 12.9  HCT 48.5*   < >  --   --   --  50.0*  --  39.5 41.4  PLT 150   < >  --   --   --  119*  --  117* 118*  APTT 30  --   --   --   --   --   --   --   --   LABPROT 14.3  --   --   --   --   --   --   --   --   INR 1.1  --   --   --   --   --   --   --   --   CREATININE 1.05*   < >  --   --  0.80  --   --  0.65 0.61  TROPONINIHS  --   --  18* 15  --   --   --   --   --    < > = values in this interval not displayed.    Estimated Creatinine Clearance: 41.2 mL/min (by C-G formula based on SCr of 0.61 mg/dL).   Medications:  Medications Prior to Admission  Medication Sig Dispense Refill Last Dose  . acetaminophen (TYLENOL) 500 MG tablet Take 1,000 mg by mouth every 6 (six) hours as needed for mild pain. For pain    unknown  . alendronate (FOSAMAX) 70 MG tablet Take 70 mg by mouth every Wednesday. Take  with a full glass of water on an empty stomach.    07/17/2019 at Unknown time  . ALPRAZolam (XANAX) 0.25 MG tablet Take 0.25 mg by mouth every 8 (eight) hours as needed for anxiety.    07/15/2019 at Unknown time  . aspirin (ASPIRIN EC) 81 MG EC tablet Take 1 tablet (81 mg total) by mouth daily. Swallow whole.   07/17/2019 at Unknown time  . calcium citrate-vitamin D 500-400 MG-UNIT chewable tablet Chew 1 tablet by mouth 2 (two) times daily.   07/17/2019 at Unknown time  . digoxin (LANOXIN) 0.125 MG tablet Take 1 tablet (0.125 mg total) by mouth every evening. 30 tablet 2 07/16/2019 at Unknown time  . diltiazem (CARDIZEM) 30 MG tablet Take 1 tablet (30 mg total) by mouth every 6 (  six) hours. (Patient taking differently: Take 30 mg by mouth 4 (four) times daily. ) 120 tablet 2 07/17/2019 at Unknown time  . docusate sodium (COLACE) 100 MG capsule Take 100 mg by mouth at bedtime.   07/16/2019 at Unknown time  . fluticasone (FLONASE) 50 MCG/ACT nasal spray Place 2 sprays into both nostrils daily.   07/17/2019 at Unknown time  . furosemide (LASIX) 20 MG tablet Take 10 mg by mouth 2 (two) times daily.    07/17/2019 at Unknown time  . gabapentin (NEURONTIN) 300 MG capsule Take 300 mg by mouth at bedtime.   07/16/2019 at Unknown time  . guaiFENesin (MUCINEX) 600 MG 12 hr tablet Take 2 tablets (1,200 mg total) by mouth 2 (two) times daily. (Patient taking differently: Take 600 mg by mouth daily. )   07/17/2019 at Unknown time  . isosorbide mononitrate (IMDUR) 30 MG 24 hr tablet Take 0.5 tablets (15 mg total) by mouth daily. 30 tablet 0 07/17/2019 at Unknown time  . levETIRAcetam (KEPPRA) 250 MG tablet Take 125-250 mg by mouth See admin instructions. Takes one tablet in the morning and half a tablet at 3 pm.   07/17/2019 at 900  . levothyroxine (SYNTHROID, LEVOTHROID) 25 MCG tablet Take 25 mcg by mouth daily before breakfast.   07/17/2019 at Unknown time  . Melatonin 3 MG TABS Take 5 mg by mouth at bedtime.    07/16/2019 at  Unknown time  . meloxicam (MOBIC) 15 MG tablet Take 15 mg by mouth daily.    07/17/2019 at Unknown time  . midodrine (PROAMATINE) 2.5 MG tablet Take 1 tablet (2.5 mg total) by mouth 2 (two) times daily with a meal. The dosing can be titrated down to once daily when her systolic blood pressure is consistently in the 100s.   07/17/2019 at Unknown time  . montelukast (SINGULAIR) 10 MG tablet Take 10 mg by mouth daily.   10 07/17/2019 at Unknown time  . Multiple Vitamins-Minerals (ICAPS) CAPS Take 1 capsule by mouth 2 (two) times daily.   07/17/2019 at Unknown time  . ondansetron (ZOFRAN) 4 MG tablet Take 4 mg by mouth every 6 (six) hours as needed for nausea or vomiting.   07/17/2019 at Unknown time  . pantoprazole (PROTONIX) 40 MG tablet Take 1 tablet (40 mg total) by mouth daily. 30 tablet 11 07/17/2019 at Unknown time  . potassium chloride (K-DUR,KLOR-CON) 10 MEQ tablet Take 10 mEq by mouth daily.    07/17/2019 at Unknown time  . sertraline (ZOLOFT) 25 MG tablet Take 25 mg by mouth daily.   07/17/2019 at Unknown time  . topiramate (TOPAMAX) 25 MG tablet Take 1 tablet by mouth daily as needed (for headache).   9 unknown   Scheduled:  . apixaban  10 mg Oral BID   Followed by  . [START ON 07/27/2019] apixaban  5 mg Oral BID  . vitamin C  500 mg Oral Daily  . aspirin EC  81 mg Oral Daily  . calcium-vitamin D  1 tablet Oral BID  . Chlorhexidine Gluconate Cloth  6 each Topical Daily  . digoxin  0.125 mg Oral QPM  . diltiazem  30 mg Oral Q6H  . docusate sodium  100 mg Oral QHS  . gabapentin  300 mg Oral QHS  . guaiFENesin  600 mg Oral Daily  . levETIRAcetam  250 mg Oral Daily   And  . levETIRAcetam  125 mg Oral Q24H  . levothyroxine  25 mcg Oral Q0600  . Melatonin  4.5 mg Oral QHS  . midodrine  2.5 mg Oral BID WC  . montelukast  10 mg Oral Daily  . multivitamin-lutein  1 capsule Oral BID  . mupirocin ointment  1 application Nasal BID  . pantoprazole  40 mg Oral Daily  . potassium chloride  10 mEq  Oral Daily  . sertraline  25 mg Oral Daily  . sodium chloride flush  3 mL Intravenous Q12H  . zinc sulfate  220 mg Oral Daily   Infusions:  . sodium chloride    . sodium chloride    . ampicillin-sulbactam (UNASYN) IV 1.5 g (07/20/19 0817)  . lactated ringers 75 mL/hr at 07/17/19 2230  . lactated ringers 75 mL/hr at 07/19/19 1711   PRN: sodium chloride, acetaminophen **OR** acetaminophen, bisacodyl, ondansetron **OR** ondansetron (ZOFRAN) IV, polyethylene glycol, sodium chloride flush, topiramate Anti-infectives (From admission, onward)   Start     Dose/Rate Route Frequency Ordered Stop   07/19/19 0930  ampicillin-sulbactam (UNASYN) 1.5 g in sodium chloride 0.9 % 100 mL IVPB     1.5 g 200 mL/hr over 30 Minutes Intravenous Every 8 hours 07/19/19 0929     07/18/19 0500  cefTRIAXone (ROCEPHIN) 1 g in sodium chloride 0.9 % 100 mL IVPB  Status:  Discontinued     1 g 200 mL/hr over 30 Minutes Intravenous Every 24 hours 07/17/19 2044 07/19/19 0929   07/17/19 1730  vancomycin (VANCOREADY) IVPB 1250 mg/250 mL     1,250 mg 166.7 mL/hr over 90 Minutes Intravenous  Once 07/17/19 1718 07/17/19 2044   07/17/19 1700  ceFEPIme (MAXIPIME) 2 g in sodium chloride 0.9 % 100 mL IVPB     2 g 200 mL/hr over 30 Minutes Intravenous  Once 07/17/19 1648 07/17/19 2044   07/17/19 1700  metroNIDAZOLE (FLAGYL) IVPB 500 mg     500 mg 100 mL/hr over 60 Minutes Intravenous  Once 07/17/19 1648 07/17/19 2044   07/17/19 1700  vancomycin (VANCOCIN) IVPB 1000 mg/200 mL premix  Status:  Discontinued     1,000 mg 200 mL/hr over 60 Minutes Intravenous  Once 07/17/19 1648 07/17/19 1718      Assessment: 86 year old female with PE, pharmacy consulted for apixaban dosing for treatment.   Goal of Therapy:  anticoagulation with apixaban Monitor platelets by anticoagulation protocol: Yes   Plan:  Apixaban 10mg  po bid x 7 days followed by apixaban 5mg  po bid thereafter. Following MD to determine length of treatment.  Monitor for signs and symptoms of bleeding   Mikhai Bienvenue 07/20/2019,12:15 PM

## 2019-07-21 ENCOUNTER — Inpatient Hospital Stay (HOSPITAL_COMMUNITY): Payer: Medicare Other

## 2019-07-21 DIAGNOSIS — I361 Nonrheumatic tricuspid (valve) insufficiency: Secondary | ICD-10-CM

## 2019-07-21 DIAGNOSIS — I350 Nonrheumatic aortic (valve) stenosis: Secondary | ICD-10-CM

## 2019-07-21 LAB — CBC
HCT: 43 % (ref 36.0–46.0)
Hemoglobin: 13.5 g/dL (ref 12.0–15.0)
MCH: 28.9 pg (ref 26.0–34.0)
MCHC: 31.4 g/dL (ref 30.0–36.0)
MCV: 92.1 fL (ref 80.0–100.0)
Platelets: 136 10*3/uL — ABNORMAL LOW (ref 150–400)
RBC: 4.67 MIL/uL (ref 3.87–5.11)
RDW: 15.2 % (ref 11.5–15.5)
WBC: 4.9 10*3/uL (ref 4.0–10.5)
nRBC: 0 % (ref 0.0–0.2)

## 2019-07-21 LAB — BASIC METABOLIC PANEL
Anion gap: 8 (ref 5–15)
BUN: 15 mg/dL (ref 8–23)
CO2: 28 mmol/L (ref 22–32)
Calcium: 8.4 mg/dL — ABNORMAL LOW (ref 8.9–10.3)
Chloride: 105 mmol/L (ref 98–111)
Creatinine, Ser: 0.47 mg/dL (ref 0.44–1.00)
GFR calc Af Amer: >60 mL/min (ref >60)
GFR calc non Af Amer: >60 mL/min (ref >60)
Glucose, Bld: 89 mg/dL (ref 70–99)
Potassium: 3.5 mmol/L (ref 3.5–5.1)
Sodium: 141 mmol/L (ref 135–145)

## 2019-07-21 LAB — ECHOCARDIOGRAM COMPLETE
Height: 65 in
Weight: 2437.41 oz

## 2019-07-21 LAB — D-DIMER, QUANTITATIVE: D-Dimer, Quant: 2.65 ug/mL-FEU — ABNORMAL HIGH (ref 0.00–0.50)

## 2019-07-21 LAB — PHOSPHORUS: Phosphorus: 1.2 mg/dL — ABNORMAL LOW (ref 2.5–4.6)

## 2019-07-21 LAB — FERRITIN: Ferritin: 346 ng/mL — ABNORMAL HIGH (ref 11–307)

## 2019-07-21 LAB — MAGNESIUM: Magnesium: 2.1 mg/dL (ref 1.7–2.4)

## 2019-07-21 LAB — C-REACTIVE PROTEIN: CRP: 6.5 mg/dL — ABNORMAL HIGH (ref <1.0)

## 2019-07-21 MED ORDER — APIXABAN 5 MG PO TABS: 10.0000 mg | ORAL_TABLET | Freq: Two times a day (BID) | ORAL | 1 refills | Status: DC

## 2019-07-21 MED ORDER — AMOXICILLIN-POT CLAVULANATE 875-125 MG PO TABS: 1.0000 | ORAL_TABLET | Freq: Two times a day (BID) | ORAL | 0 refills | Status: DC

## 2019-07-21 MED ORDER — ALPRAZOLAM 0.25 MG PO TABS: 0.2500 mg | ORAL_TABLET | Freq: Three times a day (TID) | ORAL | 0 refills | Status: DC | PRN

## 2019-07-21 NOTE — TOC Initial Note (Signed)
Transition of Care [facility]) - Initial/Assessment Note    Patient Details  Name: Deborah [name] MRN: [id number] Date of Birth: 10-30-1925  Transition of Care [facility]) CM/SW Contact:    Malcolm Metro, RN Phone Number: 07/21/2019, 12:57 PM  Clinical Narrative:         Pt to return to [facility] today. PT saw pt last week, pt ambulated 3 ft, has been getting up to chair with staff and able to walk a few steps per daughter. Family would prefer pt return to ALF and receive PT there, discussed with [employer] rep, Michelle pt's PT eval and family preference, they are okay with pt returning. WC ordered through [employer] and will be delivered to pt room prior to facility transport. Facility aware PT needed and they will provide in house.           Expected Discharge Plan: Assisted Living Barriers to Discharge: No Barriers Identified   Patient Goals and CMS Choice Patient states their goals for this hospitalization and ongoing recovery are:: get stronger      Expected Discharge Plan and Services Expected Discharge Plan: Assisted Living       Living arrangements for the past 2 months: Assisted Living Facility Expected Discharge Date: 07/21/19               DME Arranged: Wheelchair manual DME Agency: [employer] Date DME Agency Contacted: 07/21/19 Time DME Agency Contacted: [id number] Representative spoke with at DME Agency: Therisa Doyne            Prior Living Arrangements/Services Living arrangements for the past 2 months: Assisted Living Facility            Need for Family Participation in Patient Care: Yes (Comment) Care giver support system in place?: Yes (comment)      Activities of Daily Living Home Assistive Devices/Equipment: Other (Comment) ADL Screening (condition at time of admission) Patient's cognitive ability adequate to safely complete daily activities?: Yes Is the patient deaf or have difficulty hearing?: No Does the patient have difficulty seeing, even when  wearing glasses/contacts?: No Does the patient have difficulty concentrating, remembering, or making decisions?: Yes Patient able to express need for assistance with ADLs?: Yes Does the patient have difficulty dressing or bathing?: Yes Independently performs ADLs?: No Communication: Independent Dressing (OT): Needs assistance Is this a change from baseline?: Pre-admission baseline Grooming: Needs assistance Is this a change from baseline?: Pre-admission baseline Feeding: Independent Bathing: Needs assistance Is this a change from baseline?: Pre-admission baseline Toileting: Needs assistance Is this a change from baseline?: Pre-admission baseline In/Out Bed: Needs assistance Is this a change from baseline?: Pre-admission baseline Walks in Home: Needs assistance Is this a change from baseline?: Pre-admission baseline Does the patient have difficulty walking or climbing stairs?: Yes Weakness of Legs: Both Weakness of Arms/Hands: None  Permission Sought/Granted                  Emotional Assessment       Orientation: : Oriented to Self, Oriented to Place, Oriented to  Time, Oriented to Situation Alcohol / Substance Use: Not Applicable    Admission diagnosis:  Acute cystitis without hematuria [N30.00] Sepsis (HCC) [A41.9] Sepsis, due to unspecified organism, unspecified whether acute organ dysfunction present [facility]) [A41.9] Patient Active Problem List   Diagnosis Date Noted  . Acute pulmonary embolism without acute cor pulmonale (HCC) 07/20/2019  . Sepsis due to undetermined organism (HCC) 07/18/2019  . AKI (acute kidney injury) (HCC) 07/18/2019  . Acute  cystitis without hematuria   . Sepsis ([place]) 07/17/2019  . COVID-19 virus infection 07/17/2019  . Chest pain 09/02/2017  . Compression fracture of body of thoracic vertebra ([place]) 06/22/2017  . Atrial fibrillation with rapid ventricular response ([place])   . Screening for colorectal cancer 03/24/2017  . Hemorrhoids  03/24/2017  . Itching in the vaginal area 03/24/2017  . Vulval thrush 03/24/2017  . Hypoxemia   . Alzheimer disease ([place]) 09/13/2016  . Seizures ([place]) 09/13/2016  . Hypokalemia 09/13/2016  . COPD exacerbation ([place]) 09/13/2016  . Hypotension 04/22/2012  . CAD S/P percutaneous coronary angioplasty   . COPD (chronic obstructive pulmonary disease) ([place])   . Mixed hyperlipidemia 05/14/2009  . Permanent atrial fibrillation ([place]) 02/10/2009  . DIASTOLIC HEART FAILURE, CHRONIC 02/10/2009   PCP:  Lynnell Catalan, FNP Pharmacy:   [facility] [place] [place] [address] Phone: [phone] Fax: [phone]     Social Determinants of Health (SDOH) Interventions    Readmission Risk Interventions No flowsheet data found.

## 2019-07-21 NOTE — Discharge Summary (Addendum)
Physician Discharge Summary  Deborah [name] MWN:[id number] DOB: [birth year] DOA: 07/17/2019  PCP: Lynnell Catalan, FNP  Admit date: 07/17/2019 Discharge date: 07/21/2019  Admitted From: [facility] Disposition: [facility]  Recommendations for Outpatient Follow-up:  1. Follow up with PCP in 1-2 weeks 2. Please obtain BMP/CBC in one week     Discharge Condition: Stable CODE STATUS: DNR Diet recommendation:  Regular   Brief/Interim Summary: 86 year old female with a history of Alzheimer's dementia, coronary disease, permanent atrial fibrillation, diastolic CHF, hypertension, hyperlipidemia, seizure order presenting with 1 day history of generalized weakness, nausea and vomiting that started on 07/16/2019 evening and continued through breakfast on 07/17/2019. In addition, the patient was noted to be more confused than her usual baseline. Unfortunately, the patient is unable to provide history secondary to her dementia. Apparently, the patient also was noted to have a fever at [facility]. She was diagnosed with COVID-19 on 07/01/2019. The patient herself denies any chest pain, coughing, hemoptysis, diarrhea, abdominal pain, hematochezia, melena. In the emergency department, the patient had a fever up to 101.0 F with tachycardia and soft blood pressures with systolic BP in the upper 40H and low 90s. Oxygen saturation was 100% 2 L. The patient was started on IV fluids and ceftriaxone pending culture data.  Discharge Diagnoses:  Sepsis -present on admission -Secondary to UTI -Lactic acid peaked 2.1>>>less than 0.3 -Procalcitonin 0.49 -Continue IV fluids -Changed ceftriaxone to Unasyn -Follow blood and urine cultures -blood cultures remain negative -sepsis physiology has resolved  UTI--Aerococcus urinae -[id number] -discontinue ceftriaxone -continue Unasyn -d/c with 4 more days of amox/clav  COVID-19 disease/gastroenteritis -Continue to trend inflammatory markers -Personally  view chest x-ray--left pleural thickening, no consolidation or edema -CRP 13.8>>>10.9>>16.4>>11.5>>>6.5 -Ferritin 626>>>610>>575>>471>>346 -D-dimer9.16>>5.86>>3.74>>2l65 -vomiting improved--start full liquids>>regular diet which pt is now tolerating  Acute Pulmonary embolus -CTA chest-->+single subsegmental RLL PE; trace bilateral pleural effusion -2/28 Echo--EF 60-65%, no WMA, mod TR, mildly reduced RV function with elevated PASP, mild AS -started apixaban 10 mg bid through 07/26/19, then 5 mg bid starting 07/27/19 -appears provoked by recent COVID 19  Acute kidney injury -Secondary to sepsis and volume depletion -Baseline creatinine 0.5-2.6 -Serum creatinine peaked 1.05 -Continued IV fluids -holding mobic -serum creatinine 0.47 on day of d/c  Permanent atrial fibrillationwith RVR -Rate is slightly elevated secondary to sepsis -Not an anticoagulation candidate secondary to GI bleed and falls -Continue aspirin -continue digoxin -restarted diltiazem-->rate controlled at time of d/c  Chronic diastolic CHF -The patient appears hypovolemic presently -Judicious IV fluids -holding lasix due to volume depletion--restart after d/c  Hypothyroidism -Continue Synthroid  Dementia without behavioral disturbance -continue zoloft  Essential Hypertension -holding imdur due to soft BPs -restarted diltiazem  Seizure disorder -continue Keppra  COVID-19 disease -diagnosed 07/01/19 -no dyspnea or hypoxia     Discharge Instructions   Allergies as of 07/21/2019      Reactions   Ivp Dye [iodinated Diagnostic Agents] Shortness Of Breath   ??   Omnipaque [iohexol] Shortness Of Breath, Other (See Comments)   Short of breath with chest tightness after IV injection in CT, pt was fine when she left department but developed symptoms in parking lot and went to emergency department.   Sulfa Antibiotics Shortness Of Breath   Amiodarone    Carbamazepine    REACTION: toxemia    Dilaudid [hydromorphone] Nausea And Vomiting   Tramadol Other (See Comments)   confusion      Medication List    STOP taking these medications   meloxicam 15 MG tablet Commonly known  as: MOBIC     TAKE these medications   acetaminophen 500 MG tablet Commonly known as: TYLENOL Take 1,000 mg by mouth every 6 (six) hours as needed for mild pain. For pain   alendronate 70 MG tablet Commonly known as: FOSAMAX Take 70 mg by mouth every Wednesday. Take with a full glass of water on an empty stomach.   ALPRAZolam 0.25 MG tablet Commonly known as: XANAX Take 1 tablet (0.25 mg total) by mouth every 8 (eight) hours as needed for anxiety.   amoxicillin-clavulanate 875-125 MG tablet Commonly known as: AUGMENTIN Take 1 tablet by mouth 2 (two) times daily. X 4 days   apixaban 5 MG Tabs tablet Commonly known as: ELIQUIS Take 2 tablets (10 mg total) by mouth 2 (two) times daily. Then 1 tab (5 mg) two times daily starting 07/27/19 Start taking on: July 27, 2019   aspirin 81 MG EC tablet Commonly known as: aspirin EC Take 1 tablet (81 mg total) by mouth daily. Swallow whole.   calcium citrate-vitamin D 500-400 MG-UNIT chewable tablet Chew 1 tablet by mouth 2 (two) times daily.   digoxin 0.125 MG tablet Commonly known as: LANOXIN Take 1 tablet (0.125 mg total) by mouth every evening.   diltiazem 30 MG tablet Commonly known as: CARDIZEM Take 1 tablet (30 mg total) by mouth every 6 (six) hours. What changed: when to take this   docusate sodium 100 MG capsule Commonly known as: COLACE Take 100 mg by mouth at bedtime.   fluticasone 50 MCG/ACT nasal spray Commonly known as: FLONASE Place 2 sprays into both nostrils daily.   furosemide 20 MG tablet Commonly known as: LASIX Take 10 mg by mouth 2 (two) times daily.   gabapentin 300 MG capsule Commonly known as: NEURONTIN Take 300 mg by mouth at bedtime.   guaiFENesin 600 MG 12 hr tablet Commonly known as: MUCINEX Take 2 tablets  (1,200 mg total) by mouth 2 (two) times daily. What changed:   how much to take  when to take this   ICaps Caps Take 1 capsule by mouth 2 (two) times daily.   isosorbide mononitrate 30 MG 24 hr tablet Commonly known as: IMDUR Take 0.5 tablets (15 mg total) by mouth daily.   levETIRAcetam 250 MG tablet Commonly known as: KEPPRA Take 125-250 mg by mouth See admin instructions. Takes one tablet in the morning and half a tablet at 3 pm.   levothyroxine 25 MCG tablet Commonly known as: SYNTHROID Take 25 mcg by mouth daily before breakfast.   Melatonin 3 MG Tabs Take 5 mg by mouth at bedtime.   midodrine 2.5 MG tablet Commonly known as: PROAMATINE Take 1 tablet (2.5 mg total) by mouth 2 (two) times daily with a meal. The dosing can be titrated down to once daily when her systolic blood pressure is consistently in the 100s.   montelukast 10 MG tablet Commonly known as: SINGULAIR Take 10 mg by mouth daily.   ondansetron 4 MG tablet Commonly known as: ZOFRAN Take 4 mg by mouth every 6 (six) hours as needed for nausea or vomiting.   pantoprazole 40 MG tablet Commonly known as: PROTONIX Take 1 tablet (40 mg total) by mouth daily.   potassium chloride 10 MEQ tablet Commonly known as: KLOR-CON Take 10 mEq by mouth daily.   sertraline 25 MG tablet Commonly known as: ZOLOFT Take 25 mg by mouth daily.   topiramate 25 MG tablet Commonly known as: TOPAMAX Take 1 tablet by mouth daily as  needed (for headache).      Contact information for after-discharge care    Destination    [facility] .   Service: Assisted Living Contact information: [address] [place] [address] [phone]             Allergies  Allergen Reactions  . Ivp Dye [Iodinated Diagnostic Agents] Shortness Of Breath    ??  . Omnipaque [Iohexol] Shortness Of Breath and Other (See Comments)    Short of breath with chest tightness after IV injection in CT, pt was  fine when she left department but developed symptoms in parking lot and went to emergency department.  . Sulfa Antibiotics Shortness Of Breath  . Amiodarone   . Carbamazepine     REACTION: toxemia  . Dilaudid [Hydromorphone] Nausea And Vomiting  . Tramadol Other (See Comments)    confusion    Consultations:  none   Procedures/Studies: CT ANGIO CHEST PE W OR WO CONTRAST  Result Date: 07/19/2019 CLINICAL DATA:  COVID-19 positive, short of breath, elevated D-dimer EXAM: CT ANGIOGRAPHY CHEST WITH CONTRAST TECHNIQUE: Multidetector CT imaging of the chest was performed using the standard protocol during bolus administration of intravenous contrast. Multiplanar CT image reconstructions and MIPs were obtained to evaluate the vascular anatomy. CONTRAST:  21mL OMNIPAQUE IOHEXOL 350 MG/ML SOLN COMPARISON:  07/17/2019 FINDINGS: Cardiovascular: This is a technically adequate evaluation of the pulmonary vasculature. There is a single subsegmental filling defect within the lateral basilar segment right lower lobe, best seen image 177 of series 7. This is of uncertain clinical significance. No other filling defects or pulmonary emboli. The heart is enlarged with prominent biatrial dilatation. Significant atherosclerosis throughout the coronary vessels, with mild calcified plaque at the aortic arch. No evidence of thoracic aortic aneurysm or dissection. There is high-grade stenosis within the proximal left subclavian artery, reference axial image 18 of series 6. Mediastinum/Nodes: No enlarged mediastinal, hilar, or axillary lymph nodes. Thyroid gland, trachea, and esophagus demonstrate no significant findings. Lungs/Pleura: Trace bilateral pleural effusions. There are scattered areas of subpleural consolidation most pronounced in the left upper lobe and right lower lobe, favor atelectasis. No airspace disease or pneumothorax. The central airways are patent. Upper Abdomen: No acute abnormality. Musculoskeletal: No  acute or destructive bony lesions. Reconstructed images demonstrate chronic T7 and T8 compression deformities. Review of the MIP images confirms the above findings. IMPRESSION: 1. Single subsegmental right lower lobe pulmonary embolus, of uncertain clinical significance. No other filling defects or pulmonary emboli. 2. Cardiomegaly with prominent biatrial dilatation. 3. Trace bilateral pleural effusions. Scattered areas of atelectasis. 4. Aortic Atherosclerosis (ICD10-I70.0). Coronary artery atherosclerosis. These results will be called to the ordering clinician or representative by the Radiologist Assistant, and communication documented in the PACS or zVision Dashboard. Electronically Signed   By: Sharlet Salina M.D.   On: 07/19/2019 21:37   DG Chest Port 1 View  Result Date: 07/17/2019 CLINICAL DATA:  Weakness, COVID positive EXAM: PORTABLE CHEST 1 VIEW COMPARISON:  09/14/2017 FINDINGS: Cardiomegaly with aortic atherosclerosis. Trace left pleural effusion or thickening. No focal airspace disease. No pneumothorax. IMPRESSION: 1. Trace left pleural effusion or pleural thickening. 2. Cardiomegaly without edema. 3. No acute focal airspace disease Electronically Signed   By: Jasmine Pang M.D.   On: 07/17/2019 18:31   ECHOCARDIOGRAM COMPLETE  Result Date: 07/21/2019    ECHOCARDIOGRAM REPORT   Patient Name:   Deborah [name] Date of Exam: 07/21/2019 Medical Rec #:  [id number]     Height:  65.0 in Accession #:    [id number]    Weight:       152.3 lb Date of Birth:  1925/05/25    BSA:          1.762 m Patient Age:    89 years      BP:           108/83 mmHg Patient Gender: F             HR:           72 bpm. Exam Location:  Inpatient Procedure: 2D Echo Indications:    Pulmonary Embolus 415.19 / I26.99  History:        Patient has prior history of Echocardiogram examinations, most                 recent 09/02/2017. CHF, CAD, Arrythmias:Atrial Fibrillation; Risk                 Factors:Hypertension and Dyslipidemia.  COVID 19.                  Dementia.  Sonographer:    Leta Jungling RDCS Referring Phys: [phone] [place] IMPRESSIONS  1. Left ventricular ejection fraction, by estimation, is 60 to 65%. The left ventricle has normal function. The left ventricle has no regional wall motion abnormalities. There is mild concentric left ventricular hypertrophy. LV diastolic filling cannot be assessed due to underlying atrial fibrillation.  2. Right ventricular systolic function is mildly reduced. The right ventricular size is normal. There is moderately elevated pulmonary artery systolic pressure.  3. Right atrial size was mildly dilated.  4. Left atrial size was severely dilated.  5. The mitral valve is degenerative. Normal mobility of the mitral valve leaflets. Moderate mitral annular calcification with extension into the subvalvular apparatus. Trivial mitral valve regurgitation. No evidence of mitral stenosis.  6. Tricuspid valve regurgitation is moderate.  7. The aortic valve is tricuspid. Severe aortic valve annular calcification. There is severe thickening of the aortic valve. There is severe calcifcation of the aortic valve. Aortic valve mean gradient measures 10.7 mmHg. Aortic valve peak gradient measures 20.9 mmHg. Aortic valve area, by VTI measures 0.85 cm. Dimensionless index is 0.48. Aortic valve regurgitation is not visualized. Mild aortic valve stenosis.  8. The inferior vena cava is normal in size with <50% respiratory variability, suggesting right atrial pressure of 8 mmHg. FINDINGS  Left Ventricle: Left ventricular ejection fraction, by estimation, is 60 to 65%. The left ventricle has normal function. The left ventricle has no regional wall motion abnormalities. The left ventricular internal cavity size was normal in size. There is  mild concentric left ventricular hypertrophy. LV diastolic filling cannot be assessed due to underlying atrial fibrillation. Right Ventricle: The right ventricular size is normal. Right  vetricular wall thickness was not assessed. Right ventricular systolic function is mildly reduced. There is moderately elevated pulmonary artery systolic pressure. The tricuspid regurgitant velocity is 3.20 m/s, and with an assumed right atrial pressure of 8 mmHg, the estimated right ventricular systolic pressure is 49.0 mmHg. Left Atrium: Left atrial size was severely dilated. Right Atrium: Right atrial size was mildly dilated. Pericardium: There is no evidence of pericardial effusion. Mitral Valve: The mitral valve is degenerative in appearance. Normal mobility of the mitral valve leaflets. Moderate mitral annular calcification. Trivial mitral valve regurgitation. No evidence of mitral valve stenosis. Tricuspid Valve: The tricuspid valve is normal in structure. Tricuspid valve regurgitation is moderate . No evidence of tricuspid  stenosis. Aortic Valve: The aortic valve is tricuspid. . There is severe thickening and severe calcifcation of the aortic valve. Aortic valve regurgitation is not visualized. Mild to moderate aortic stenosis is present. Severe aortic valve annular calcification. There is severe thickening of the aortic valve. There is severe calcifcation of the aortic valve. Aortic valve mean gradient measures 10.7 mmHg. Aortic valve peak gradient measures 20.9 mmHg. Aortic valve area, by VTI measures 0.85 cm. Pulmonic Valve: The pulmonic valve was normal in structure. Pulmonic valve regurgitation is trivial. No evidence of pulmonic stenosis. Aorta: The aortic root is normal in size and structure. Venous: The inferior vena cava is normal in size with less than 50% respiratory variability, suggesting right atrial pressure of 8 mmHg. IAS/Shunts: No atrial level shunt detected by color flow Doppler.  LEFT VENTRICLE PLAX 2D LVIDd:         3.62 cm     Diastology LVIDs:         3.11 cm     LV e' lateral:   8.70 cm/s LV PW:         1.25 cm     LV E/e' lateral: 10.3 LV IVS:        1.22 cm     LV e' medial:     6.92 cm/s LVOT diam:     1.50 cm     LV E/e' medial:  13.0 LV SV:         39 LV SV Index:   22 LVOT Area:     1.77 cm  LV Volumes (MOD) LV vol d, MOD A2C: 66.7 ml LV vol d, MOD A4C: 68.2 ml LV vol s, MOD A2C: 31.4 ml LV vol s, MOD A4C: 21.1 ml LV SV MOD A2C:     35.3 ml LV SV MOD A4C:     68.2 ml LV SV MOD BP:      42.0 ml RIGHT VENTRICLE RV S prime:     12.00 cm/s TAPSE (M-mode): 1.4 cm LEFT ATRIUM             Index       RIGHT ATRIUM           Index LA diam:        3.60 cm 2.04 cm/m  RA Area:     20.40 cm LA Vol (A2C):   81.9 ml 46.48 ml/m RA Volume:   54.50 ml  30.93 ml/m LA Vol (A4C):   97.3 ml 55.22 ml/m LA Biplane Vol: 92.0 ml 52.21 ml/m  AORTIC VALVE AV Area (Vmax):    0.74 cm AV Area (Vmean):   0.80 cm AV Area (VTI):     0.85 cm AV Vmax:           228.33 cm/s AV Vmean:          153.333 cm/s AV VTI:            0.458 m AV Peak Grad:      20.9 mmHg AV Mean Grad:      10.7 mmHg LVOT Vmax:         95.60 cm/s LVOT Vmean:        69.400 cm/s LVOT VTI:          0.220 m LVOT/AV VTI ratio: 0.48  AORTA Ao Root diam: 2.70 cm Ao Asc diam:  2.80 cm MITRAL VALVE               TRICUSPID VALVE MV Area (PHT): 4.45 cm  TR Peak grad:   41.0 mmHg MV Decel Time: 170 msec    TR Vmax:        320.00 cm/s MV E velocity: 89.90 cm/s                            SHUNTS                            Systemic VTI:  0.22 m                            Systemic Diam: 1.50 cm Armanda Magicraci Turner MD Electronically signed by Armanda Magicraci Turner MD Signature Date/Time: 07/21/2019/11:43:30 AM    Final         Discharge Exam: Vitals:   07/20/19 2118 07/21/19 0521  BP: 101/75 108/83  Pulse: 72 72  Resp: 17 16  Temp: 97.8 F (36.6 C) 97.6 F (36.4 C)  SpO2: 93% 100%   Vitals:   07/20/19 1300 07/20/19 2118 07/21/19 0427 07/21/19 0521  BP: (!) 85/57 101/75  108/83  Pulse: 69 72  72  Resp: 19 17  16   Temp:  97.8 F (36.6 C)  97.6 F (36.4 C)  TempSrc:  Oral  Oral  SpO2: 100% 93%  100%  Weight:   69.1 kg   Height:        General:  Pt is alert, awake, not in acute distress Cardiovascular: IRRR, S1/S2 +, no rubs, no gallops Respiratory: bibasilar rales. No wheeze Abdominal: Soft, NT, ND, bowel sounds + Extremities: trace LE edema, no cyanosis   The results of significant diagnostics from this hospitalization (including imaging, microbiology, ancillary and laboratory) are listed below for reference.    Significant Diagnostic Studies: CT ANGIO CHEST PE W OR WO CONTRAST  Result Date: 07/19/2019 CLINICAL DATA:  COVID-19 positive, short of breath, elevated D-dimer EXAM: CT ANGIOGRAPHY CHEST WITH CONTRAST TECHNIQUE: Multidetector CT imaging of the chest was performed using the standard protocol during bolus administration of intravenous contrast. Multiplanar CT image reconstructions and MIPs were obtained to evaluate the vascular anatomy. CONTRAST:  75mL OMNIPAQUE IOHEXOL 350 MG/ML SOLN COMPARISON:  07/17/2019 FINDINGS: Cardiovascular: This is a technically adequate evaluation of the pulmonary vasculature. There is a single subsegmental filling defect within the lateral basilar segment right lower lobe, best seen image 177 of series 7. This is of uncertain clinical significance. No other filling defects or pulmonary emboli. The heart is enlarged with prominent biatrial dilatation. Significant atherosclerosis throughout the coronary vessels, with mild calcified plaque at the aortic arch. No evidence of thoracic aortic aneurysm or dissection. There is high-grade stenosis within the proximal left subclavian artery, reference axial image 18 of series 6. Mediastinum/Nodes: No enlarged mediastinal, hilar, or axillary lymph nodes. Thyroid gland, trachea, and esophagus demonstrate no significant findings. Lungs/Pleura: Trace bilateral pleural effusions. There are scattered areas of subpleural consolidation most pronounced in the left upper lobe and right lower lobe, favor atelectasis. No airspace disease or pneumothorax. The central airways are  patent. Upper Abdomen: No acute abnormality. Musculoskeletal: No acute or destructive bony lesions. Reconstructed images demonstrate chronic T7 and T8 compression deformities. Review of the MIP images confirms the above findings. IMPRESSION: 1. Single subsegmental right lower lobe pulmonary embolus, of uncertain clinical significance. No other filling defects or pulmonary emboli. 2. Cardiomegaly with prominent biatrial dilatation. 3. Trace bilateral pleural effusions. Scattered areas of  atelectasis. 4. Aortic Atherosclerosis (ICD10-I70.0). Coronary artery atherosclerosis. These results will be called to the ordering clinician or representative by the Radiologist Assistant, and communication documented in the PACS or zVision Dashboard. Electronically Signed   By: Sharlet Salina M.D.   On: 07/19/2019 21:37   DG Chest Port 1 View  Result Date: 07/17/2019 CLINICAL DATA:  Weakness, COVID positive EXAM: PORTABLE CHEST 1 VIEW COMPARISON:  09/14/2017 FINDINGS: Cardiomegaly with aortic atherosclerosis. Trace left pleural effusion or thickening. No focal airspace disease. No pneumothorax. IMPRESSION: 1. Trace left pleural effusion or pleural thickening. 2. Cardiomegaly without edema. 3. No acute focal airspace disease Electronically Signed   By: Jasmine Pang M.D.   On: 07/17/2019 18:31   ECHOCARDIOGRAM COMPLETE  Result Date: 07/21/2019    ECHOCARDIOGRAM REPORT   Patient Name:   Deborah [name] Date of Exam: 07/21/2019 Medical Rec #:  [id number]     Height:       65.0 in Accession #:    [id number]    Weight:       152.3 lb Date of Birth:  01-24-1926    BSA:          1.762 m Patient Age:    89 years      BP:           108/83 mmHg Patient Gender: F             HR:           72 bpm. Exam Location:  Inpatient Procedure: 2D Echo Indications:    Pulmonary Embolus 415.19 / I26.99  History:        Patient has prior history of Echocardiogram examinations, most                 recent 09/02/2017. CHF, CAD, Arrythmias:Atrial  Fibrillation; Risk                 Factors:Hypertension and Dyslipidemia. COVID 19.                  Dementia.  Sonographer:    Leta Jungling RDCS Referring Phys: [phone] [place] IMPRESSIONS  1. Left ventricular ejection fraction, by estimation, is 60 to 65%. The left ventricle has normal function. The left ventricle has no regional wall motion abnormalities. There is mild concentric left ventricular hypertrophy. LV diastolic filling cannot be assessed due to underlying atrial fibrillation.  2. Right ventricular systolic function is mildly reduced. The right ventricular size is normal. There is moderately elevated pulmonary artery systolic pressure.  3. Right atrial size was mildly dilated.  4. Left atrial size was severely dilated.  5. The mitral valve is degenerative. Normal mobility of the mitral valve leaflets. Moderate mitral annular calcification with extension into the subvalvular apparatus. Trivial mitral valve regurgitation. No evidence of mitral stenosis.  6. Tricuspid valve regurgitation is moderate.  7. The aortic valve is tricuspid. Severe aortic valve annular calcification. There is severe thickening of the aortic valve. There is severe calcifcation of the aortic valve. Aortic valve mean gradient measures 10.7 mmHg. Aortic valve peak gradient measures 20.9 mmHg. Aortic valve area, by VTI measures 0.85 cm. Dimensionless index is 0.48. Aortic valve regurgitation is not visualized. Mild aortic valve stenosis.  8. The inferior vena cava is normal in size with <50% respiratory variability, suggesting right atrial pressure of 8 mmHg. FINDINGS  Left Ventricle: Left ventricular ejection fraction, by estimation, is 60 to 65%. The left ventricle has normal function. The left ventricle has no  regional wall motion abnormalities. The left ventricular internal cavity size was normal in size. There is  mild concentric left ventricular hypertrophy. LV diastolic filling cannot be assessed due to underlying atrial  fibrillation. Right Ventricle: The right ventricular size is normal. Right vetricular wall thickness was not assessed. Right ventricular systolic function is mildly reduced. There is moderately elevated pulmonary artery systolic pressure. The tricuspid regurgitant velocity is 3.20 m/s, and with an assumed right atrial pressure of 8 mmHg, the estimated right ventricular systolic pressure is 49.0 mmHg. Left Atrium: Left atrial size was severely dilated. Right Atrium: Right atrial size was mildly dilated. Pericardium: There is no evidence of pericardial effusion. Mitral Valve: The mitral valve is degenerative in appearance. Normal mobility of the mitral valve leaflets. Moderate mitral annular calcification. Trivial mitral valve regurgitation. No evidence of mitral valve stenosis. Tricuspid Valve: The tricuspid valve is normal in structure. Tricuspid valve regurgitation is moderate . No evidence of tricuspid stenosis. Aortic Valve: The aortic valve is tricuspid. . There is severe thickening and severe calcifcation of the aortic valve. Aortic valve regurgitation is not visualized. Mild to moderate aortic stenosis is present. Severe aortic valve annular calcification. There is severe thickening of the aortic valve. There is severe calcifcation of the aortic valve. Aortic valve mean gradient measures 10.7 mmHg. Aortic valve peak gradient measures 20.9 mmHg. Aortic valve area, by VTI measures 0.85 cm. Pulmonic Valve: The pulmonic valve was normal in structure. Pulmonic valve regurgitation is trivial. No evidence of pulmonic stenosis. Aorta: The aortic root is normal in size and structure. Venous: The inferior vena cava is normal in size with less than 50% respiratory variability, suggesting right atrial pressure of 8 mmHg. IAS/Shunts: No atrial level shunt detected by color flow Doppler.  LEFT VENTRICLE PLAX 2D LVIDd:         3.62 cm     Diastology LVIDs:         3.11 cm     LV e' lateral:   8.70 cm/s LV PW:         1.25  cm     LV E/e' lateral: 10.3 LV IVS:        1.22 cm     LV e' medial:    6.92 cm/s LVOT diam:     1.50 cm     LV E/e' medial:  13.0 LV SV:         39 LV SV Index:   22 LVOT Area:     1.77 cm  LV Volumes (MOD) LV vol d, MOD A2C: 66.7 ml LV vol d, MOD A4C: 68.2 ml LV vol s, MOD A2C: 31.4 ml LV vol s, MOD A4C: 21.1 ml LV SV MOD A2C:     35.3 ml LV SV MOD A4C:     68.2 ml LV SV MOD BP:      42.0 ml RIGHT VENTRICLE RV S prime:     12.00 cm/s TAPSE (M-mode): 1.4 cm LEFT ATRIUM             Index       RIGHT ATRIUM           Index LA diam:        3.60 cm 2.04 cm/m  RA Area:     20.40 cm LA Vol (A2C):   81.9 ml 46.48 ml/m RA Volume:   54.50 ml  30.93 ml/m LA Vol (A4C):   97.3 ml 55.22 ml/m LA Biplane Vol: 92.0 ml 52.21 ml/m  AORTIC VALVE  AV Area (Vmax):    0.74 cm AV Area (Vmean):   0.80 cm AV Area (VTI):     0.85 cm AV Vmax:           228.33 cm/s AV Vmean:          153.333 cm/s AV VTI:            0.458 m AV Peak Grad:      20.9 mmHg AV Mean Grad:      10.7 mmHg LVOT Vmax:         95.60 cm/s LVOT Vmean:        69.400 cm/s LVOT VTI:          0.220 m LVOT/AV VTI ratio: 0.48  AORTA Ao Root diam: 2.70 cm Ao Asc diam:  2.80 cm MITRAL VALVE               TRICUSPID VALVE MV Area (PHT): 4.45 cm    TR Peak grad:   41.0 mmHg MV Decel Time: 170 msec    TR Vmax:        320.00 cm/s MV E velocity: 89.90 cm/s                            SHUNTS                            Systemic VTI:  0.22 m                            Systemic Diam: 1.50 cm Armanda Magic MD Electronically signed by Armanda Magic MD Signature Date/Time: 07/21/2019/11:43:30 AM    Final      Microbiology: Recent Results (from the past 240 hour(s))  Urine culture     Status: Abnormal   Collection Time: 07/17/19  4:47 PM   Specimen: In/Out Cath Urine  Result Value Ref Range Status   Specimen Description   Final    IN/OUT CATH URINE Performed at [facility], [address] [place], [place] [address]    Special Requests   Final    NONE Performed at [facility], [address], [place], [place] [address]    Culture (A)  Final    60,000 COLONIES/mL AEROCOCCUS URINAE Standardized susceptibility testing for this organism is not available. Performed at [facility], [address] [place], [place] [address]    Report Status 07/19/2019 FINAL  Final  Blood Culture (routine x 2)     Status: None (Preliminary result)   Collection Time: 07/17/19  5:01 PM   Specimen: BLOOD  Result Value Ref Range Status   Specimen Description BLOOD LEFT ANTECUBITAL  Final   Special Requests   Final    BOTTLES DRAWN AEROBIC AND ANAEROBIC Blood Culture adequate volume   Culture   Final    NO GROWTH 3 DAYS Performed at [facility], [address] [place], [place] [address]    Report Status PENDING  Incomplete  Blood Culture (routine x 2)     Status: None (Preliminary result)   Collection Time: 07/17/19  5:02 PM   Specimen: BLOOD  Result Value Ref Range Status   Specimen Description BLOOD RIGHT ANTECUBITAL  Final   Special Requests   Final    BOTTLES DRAWN AEROBIC AND ANAEROBIC Blood Culture adequate volume   Culture   Final  NO GROWTH 3 DAYS Performed at [facility], [address], [place], [place] [address]    Report Status PENDING  Incomplete  Respiratory Panel by RT PCR (Flu A&B, Covid) - Nasopharyngeal Swab     Status: Abnormal   Collection Time: 07/17/19  6:25 PM   Specimen: Nasopharyngeal Swab  Result Value Ref Range Status   SARS Coronavirus 2 by RT PCR POSITIVE (A) NEGATIVE Final    Comment: RESULT CALLED TO, READ BACK BY AND VERIFIED WITH: H CRAWFORD,RN @2040  07/17/19 MKELLY (NOTE) SARS-CoV-2 target nucleic acids are DETECTED. SARS-CoV-2 RNA is generally detectable in upper respiratory specimens  during the acute phase of infection. Positive results are indicative of the presence of the identified virus, but do not rule out bacterial infection or co-infection with other pathogens not detected by the test. Clinical correlation with  patient history and other diagnostic information is necessary to determine patient infection status. The expected result is Negative. Fact Sheet for Patients:  07/19/19 Fact Sheet for Healthcare Providers: https://www.moore.com/ This test is not yet approved or cleared by the https://www.young.biz/ FDA and  has been authorized for detection and/or diagnosis of SARS-CoV-2 by FDA under an Emergency Use Authorization (EUA).  This EUA will remain in effect (meaning this test can be used) f or the duration of  the COVID-19 declaration under Section 564(b)(1) of the Act, 21 U.S.C. section 360bbb-3(b)(1), unless the authorization is terminated or revoked sooner.    Influenza A by PCR NEGATIVE NEGATIVE Final   Influenza B by PCR NEGATIVE NEGATIVE Final    Comment: (NOTE) The Xpert Xpress SARS-CoV-2/FLU/RSV assay is intended as an aid in  the diagnosis of influenza from Nasopharyngeal swab specimens and  should not be used as a sole basis for treatment. Nasal washings and  aspirates are unacceptable for Xpert Xpress SARS-CoV-2/FLU/RSV  testing. Fact Sheet for Patients: [place] Fact Sheet for Healthcare Providers: https://www.moore.com/ This test is not yet approved or cleared by the https://www.young.biz/ FDA and  has been authorized for detection and/or diagnosis of SARS-CoV-2 by  FDA under an Emergency Use Authorization (EUA). This EUA will remain  in effect (meaning this test can be used) for the duration of the  Covid-19 declaration under Section 564(b)(1) of the Act, 21  U.S.C. section 360bbb-3(b)(1), unless the authorization is  terminated or revoked. Performed at [facility], [address], [place], [place]   MRSA PCR Screening     Status: Abnormal   Collection Time: 07/18/19 12:47 AM   Specimen: Nasal Mucosa; Nasopharyngeal  Result Value Ref Range Status   MRSA by PCR POSITIVE (A)  NEGATIVE Final    Comment:        The GeneXpert MRSA Assay (FDA approved for NASAL specimens only), is one component of a comprehensive MRSA colonization surveillance program. It is not intended to diagnose MRSA infection nor to guide or monitor treatment for MRSA infections. RESULT CALLED TO, READ BACK BY AND VERIFIED WITH: S WADE,RN @0521  07/18/19 [facility] Performed at [facility], [address] [place], [address] [place]      Labs: Basic Metabolic Panel: Recent Labs  Lab 07/17/19 1701 07/17/19 1701 07/18/19 0519 07/18/19 0519 07/19/19 0613 07/19/19 0613 07/20/19 0436 07/21/19 0710  NA 136  --  138  --  140  --  140 141  K 4.3   < > 3.9   < > 4.0   < > 4.3 3.5  CL 99  --  109  --  108  --  107 105  CO2 27  --  21*  --  24  --  27 28  GLUCOSE 112*  --  99  --  100*  --  102* 89  BUN 29*  --  28*  --  25*  --  19 15  CREATININE 1.05*  --  0.80  --  0.65  --  0.61 0.47  CALCIUM 9.1  --  7.8*  --  8.4*  --  8.4* 8.4*  MG  --   --  2.0  --  2.1  --  2.2 2.1  PHOS  --   --  2.4*  --  2.1*  --  2.8 1.2*   < > = values in this interval not displayed.   Liver Function Tests: Recent Labs  Lab 07/17/19 1701 07/19/19 0613 07/20/19 0436  AST 32 39 30  ALT 18 22 21   ALKPHOS 71 59 62  BILITOT 0.6 0.5 0.5  PROT 6.8 5.2* 5.2*  ALBUMIN 3.8 2.8* 2.7*   Recent Labs  Lab 07/17/19 1701  LIPASE 27   No results for input(s): AMMONIA in the last 168 hours. CBC: Recent Labs  Lab 07/17/19 1701 07/18/19 0606 07/19/19 0613 07/20/19 0436 07/21/19 0710  WBC 5.8 4.4 3.0* 4.9 4.9  NEUTROABS 5.3  --   --   --   --   HGB 15.3* 15.2* 12.4 12.9 13.5  HCT 48.5* 50.0* 39.5 41.4 43.0  MCV 92.6 98.2 92.9 94.3 92.1  PLT 150 119* 117* 118* 136*   Cardiac Enzymes: No results for input(s): CKTOTAL, CKMB, CKMBINDEX, TROPONINI in the last 168 hours. BNP: Invalid input(s): POCBNP CBG: No results for input(s): GLUCAP in the last 168 hours.  Time coordinating discharge:  36  minutes  Signed:  07/23/19, DO [facility] Pager: [phone] 07/21/2019, 12:03 PM

## 2019-07-21 NOTE — NC FL2 (Signed)
MEDICAID FL2 LEVEL OF CARE SCREENING TOOL     IDENTIFICATION  Patient Name: Deborah [name] Birthdate: 1925/09/12 Sex: female Admission Date (Current Location): 07/17/2019  [facility] and [place] Number:  [employer] and Address:  [facility],  [address], [place] [address]      Provider Number: [id number]  Attending Physician Name and Address:  [name], MD  Relative Name and Phone Number:  Lonn Georgia [phone]    Current Level of Care: Hospital Recommended Level of Care: Assisted Living Facility Prior Approval Number:    Date Approved/Denied:   PASRR Number:    Discharge Plan: Domiciliary (Rest home)    Current Diagnoses: Patient Active Problem List   Diagnosis Date Noted  . Acute pulmonary embolism without acute cor pulmonale (HCC) 07/20/2019  . Sepsis due to undetermined organism (HCC) 07/18/2019  . AKI (acute kidney injury) (HCC) 07/18/2019  . Acute cystitis without hematuria   . Sepsis (HCC) 07/17/2019  . COVID-19 virus infection 07/17/2019  . Chest pain 09/02/2017  . Compression fracture of body of thoracic vertebra (HCC) 06/22/2017  . Atrial fibrillation with rapid ventricular response (HCC)   . Screening for colorectal cancer 03/24/2017  . Hemorrhoids 03/24/2017  . Itching in the vaginal area 03/24/2017  . Vulval thrush 03/24/2017  . Hypoxemia   . Alzheimer disease (HCC) 09/13/2016  . Seizures (HCC) 09/13/2016  . Hypokalemia 09/13/2016  . COPD exacerbation (HCC) 09/13/2016  . Hypotension 04/22/2012  . CAD S/P percutaneous coronary angioplasty   . COPD (chronic obstructive pulmonary disease) (HCC)   . Mixed hyperlipidemia 05/14/2009  . Permanent atrial fibrillation (HCC) 02/10/2009  . DIASTOLIC HEART FAILURE, CHRONIC 02/10/2009    Orientation RESPIRATION BLADDER Height & Weight     Self, Time, Situation, Place  Normal(room air) Continent Weight: 69.1 kg Height:  5\' 5"  (165.1 cm)  BEHAVIORAL SYMPTOMS/MOOD  NEUROLOGICAL BOWEL NUTRITION STATUS  (n/a) (n/a) Continent Diet(regular diet)  AMBULATORY STATUS COMMUNICATION OF NEEDS Skin   Extensive Assist Verbally Normal                       Personal Care Assistance Level of Assistance  Bathing, Feeding, Dressing Bathing Assistance: Limited assistance Feeding assistance: Independent Dressing Assistance: Maximum assistance     Functional Limitations Info  Sight, Speech, Hearing Sight Info: Adequate Hearing Info: Adequate Speech Info: Adequate    SPECIAL CARE FACTORS FREQUENCY  PT (By licensed PT)     PT Frequency: 3X/week              Contractures Contractures Info: Not present    Additional Factors Info  Code Status, Allergies, Isolation Precautions, Psychotropic Code Status Info: DNR Allergies Info: Ivp Dye, Diagnostic Agents, Omnipaque, Sulfa Antibiotics, Amiodarone, Carbamazepine, Dilaudid, Tramadol Psychotropic Info: Xanax, Zoloft   Isolation Precautions Info: COVID+ 06/27/19 per facility documentation.     Current Medications (07/21/2019):  This is the current hospital active medication list Current Facility-Administered Medications  Medication Dose Route Frequency Provider Last Rate Last Admin  . 0.9 %  sodium chloride infusion  250 mL Intravenous PRN Tat, 07/23/2019, MD      . 0.9 %  sodium chloride infusion   Intravenous Continuous Tat, Onalee Hua, MD 75 mL/hr at 07/21/19 0859 New Bag at 07/21/19 0859  . acetaminophen (TYLENOL) tablet 650 mg  650 mg Oral Q6H PRN Tat, David, MD       Or  . acetaminophen (TYLENOL) suppository 650 mg  650 mg Rectal Q6H  PRN Orson Eva, MD      . ampicillin-sulbactam (UNASYN) 1.5 g in sodium chloride 0.9 % 100 mL IVPB  1.5 g Intravenous Q8H Tat, Shanon Brow, MD 200 mL/hr at 07/21/19 0900 1.5 g at 07/21/19 0900  . apixaban (ELIQUIS) tablet 10 mg  10 mg Oral BID Coffee, Donna Christen, RPH   10 mg at 07/21/19 0900   Followed by  . [START ON 07/27/2019] apixaban (ELIQUIS) tablet 5 mg  5 mg Oral BID Coffee,  Donna Christen, [facility]      . ascorbic acid (VITAMIN C) tablet 500 mg  500 mg Oral Daily Tat, David, MD   500 mg at 07/21/19 0901  . aspirin EC tablet 81 mg  81 mg Oral Daily Tat, Shanon Brow, MD   81 mg at 07/21/19 0901  . bisacodyl (DULCOLAX) suppository 10 mg  10 mg Rectal Daily PRN Tat, Shanon Brow, MD      . calcium-vitamin D (OSCAL WITH D) 500-200 MG-UNIT per tablet 1 tablet  1 tablet Oral BID Tat, David, MD   1 tablet at 07/21/19 0900  . digoxin (LANOXIN) tablet 0.125 mg  0.125 mg Oral QPM Tat, Shanon Brow, MD   0.125 mg at 07/20/19 1701  . diltiazem (CARDIZEM) tablet 30 mg  30 mg Oral Q6H Orson Eva, MD   30 mg at 07/21/19 1302  . docusate sodium (COLACE) capsule 100 mg  100 mg Oral Benay Pike, MD   100 mg at 07/19/19 2123  . gabapentin (NEURONTIN) capsule 300 mg  300 mg Oral Benay Pike, MD   300 mg at 07/20/19 2120  . guaiFENesin (MUCINEX) 12 hr tablet 600 mg  600 mg Oral Daily Tat, David, MD   600 mg at 07/21/19 0901  . levETIRAcetam (KEPPRA) tablet 250 mg  250 mg Oral Daily Tat, David, MD   250 mg at 07/21/19 0900   And  . levETIRAcetam (KEPPRA) tablet 125 mg  125 mg Oral Q24H Orson Eva, MD   125 mg at 07/20/19 1421  . levothyroxine (SYNTHROID) tablet 25 mcg  25 mcg Oral [id number] Orson Eva, MD   25 mcg at 07/21/19 0524  . Melatonin TABS 4.5 mg  4.5 mg Oral Benay Pike, MD   4.5 mg at 07/20/19 2120  . midodrine (PROAMATINE) tablet 2.5 mg  2.5 mg Oral BID WC Tat, Shanon Brow, MD   2.5 mg at 07/21/19 0820  . montelukast (SINGULAIR) tablet 10 mg  10 mg Oral Daily Tat, David, MD   10 mg at 07/21/19 0900  . multivitamin-lutein (OCUVITE-LUTEIN) capsule 1 capsule  1 capsule Oral BID Orson Eva, MD   1 capsule at 07/21/19 0819  . mupirocin ointment (BACTROBAN) 2 % 1 application  1 application Nasal BID Orson Eva, MD   1 application at 32/67/12 0901  . ondansetron (ZOFRAN) tablet 4 mg  4 mg Oral Q6H PRN Tat, David, MD       Or  . ondansetron (ZOFRAN) injection 4 mg  4 mg Intravenous Q6H PRN Tat, David, MD      .  pantoprazole (PROTONIX) EC tablet 40 mg  40 mg Oral Daily Tat, David, MD   40 mg at 07/21/19 0900  . polyethylene glycol (MIRALAX / GLYCOLAX) packet 17 g  17 g Oral Daily PRN Tat, David, MD      . potassium chloride SA (KLOR-CON) CR tablet 10 mEq  10 mEq Oral Daily Tat, David, MD   10 mEq at 07/21/19 0901  . sertraline (ZOLOFT) tablet 25 mg  25 mg Oral Daily Tat, David, MD   25 mg at 07/21/19 0901  . sodium chloride flush (NS) 0.9 % injection 3 mL  3 mL Intravenous Pablo Ledger, MD   3 mL at 07/20/19 2121  . sodium chloride flush (NS) 0.9 % injection 3 mL  3 mL Intravenous PRN Tat, David, MD      . topiramate (TOPAMAX) tablet 25 mg  25 mg Oral Daily PRN [name], MD   25 mg at 07/18/19 2019  . zinc sulfate capsule 220 mg  220 mg Oral Daily Tat, David, MD   220 mg at 07/21/19 0900     Discharge Medications: Medication List    STOP taking these medications   meloxicam 15 MG tablet Commonly known as: MOBIC     TAKE these medications   acetaminophen 500 MG tablet Commonly known as: TYLENOL Take 1,000 mg by mouth every 6 (six) hours as needed for mild pain. For pain   alendronate 70 MG tablet Commonly known as: FOSAMAX Take 70 mg by mouth every Wednesday. Take with a full glass of water on an empty stomach.   ALPRAZolam 0.25 MG tablet Commonly known as: XANAX Take 1 tablet (0.25 mg total) by mouth every 8 (eight) hours as needed for anxiety.   amoxicillin-clavulanate 875-125 MG tablet Commonly known as: AUGMENTIN Take 1 tablet by mouth 2 (two) times daily. X 4 days   apixaban 5 MG Tabs tablet Commonly known as: ELIQUIS Take 2 tablets (10 mg total) by mouth 2 (two) times daily. Then 1 tab (5 mg) two times daily starting 07/27/19 Start taking on: July 27, 2019   aspirin 81 MG EC tablet Commonly known as: aspirin EC Take 1 tablet (81 mg total) by mouth daily. Swallow whole.   calcium citrate-vitamin D 500-400 MG-UNIT chewable tablet Chew 1 tablet by mouth 2 (two)  times daily.   digoxin 0.125 MG tablet Commonly known as: LANOXIN Take 1 tablet (0.125 mg total) by mouth every evening.   diltiazem 30 MG tablet Commonly known as: CARDIZEM Take 1 tablet (30 mg total) by mouth every 6 (six) hours. What changed: when to take this   docusate sodium 100 MG capsule Commonly known as: COLACE Take 100 mg by mouth at bedtime.   fluticasone 50 MCG/ACT nasal spray Commonly known as: FLONASE Place 2 sprays into both nostrils daily.   furosemide 20 MG tablet Commonly known as: LASIX Take 10 mg by mouth 2 (two) times daily.   gabapentin 300 MG capsule Commonly known as: NEURONTIN Take 300 mg by mouth at bedtime.   guaiFENesin 600 MG 12 hr tablet Commonly known as: MUCINEX Take 2 tablets (1,200 mg total) by mouth 2 (two) times daily. What changed:   how much to take  when to take this   ICaps Caps Take 1 capsule by mouth 2 (two) times daily.   isosorbide mononitrate 30 MG 24 hr tablet Commonly known as: IMDUR Take 0.5 tablets (15 mg total) by mouth daily.   levETIRAcetam 250 MG tablet Commonly known as: KEPPRA Take 125-250 mg by mouth See admin instructions. Takes one tablet in the morning and half a tablet at 3 pm.   levothyroxine 25 MCG tablet Commonly known as: SYNTHROID Take 25 mcg by mouth daily before breakfast.   Melatonin 3 MG Tabs Take 5 mg by mouth at bedtime.   midodrine 2.5 MG tablet Commonly known as: PROAMATINE Take 1 tablet (2.5 mg total) by mouth 2 (two) times daily with  a meal. The dosing can be titrated down to once daily when her systolic blood pressure is consistently in the 100s.   montelukast 10 MG tablet Commonly known as: SINGULAIR Take 10 mg by mouth daily.   ondansetron 4 MG tablet Commonly known as: ZOFRAN Take 4 mg by mouth every 6 (six) hours as needed for nausea or vomiting.   pantoprazole 40 MG tablet Commonly known as: PROTONIX Take 1 tablet (40 mg total) by mouth daily.    potassium chloride 10 MEQ tablet Commonly known as: KLOR-CON Take 10 mEq by mouth daily.   sertraline 25 MG tablet Commonly known as: ZOLOFT Take 25 mg by mouth daily.   topiramate 25 MG tablet Commonly known as: TOPAMAX Take 1 tablet by mouth daily as needed (for headache).      Relevant Imaging Results:  Relevant Lab Results:   Additional Information SSN: [id number]  Theda Belfast Era Skeen, RN

## 2019-07-21 NOTE — Progress Notes (Signed)
     Durable Medical Equipment  (From admission, onward)         Start     Ordered   07/21/19 1239  For home use only DME standard manual wheelchair with seat cushion  Once    Comments: Patient suffers from COVID19 which impairs their ability to perform daily activities like ambulating in the home.  A walker will not resolve issue with performing activities of daily living. A wheelchair will allow patient to safely perform daily activities. Patient can safely propel the wheelchair in the home or has a caregiver who can provide assistance. Length of need lie time. Accessories: elevating leg rests (ELRs), wheel locks, extensions and anti-tippers.   07/21/19 1240

## 2019-07-21 NOTE — Progress Notes (Signed)
  Echocardiogram 2D Echocardiogram has been performed.  Leta Jungling M 07/21/2019, 10:38 AM

## 2019-07-21 NOTE — Plan of Care (Signed)

## 2019-07-22 LAB — CULTURE, BLOOD (ROUTINE X 2)
Culture: NO GROWTH
Culture: NO GROWTH
Special Requests: ADEQUATE
Special Requests: ADEQUATE

## 2020-07-10 ENCOUNTER — Emergency Department (HOSPITAL_COMMUNITY): Payer: Medicare Other

## 2020-07-10 ENCOUNTER — Encounter (HOSPITAL_COMMUNITY): Payer: Self-pay

## 2020-07-10 ENCOUNTER — Other Ambulatory Visit: Payer: Self-pay

## 2020-07-10 ENCOUNTER — Emergency Department (HOSPITAL_COMMUNITY)
Admission: EM | Admit: 2020-07-10 | Discharge: 2020-07-11 | Disposition: A | Discharge status: Home / Self Care | Payer: Medicare Other | Attending: Emergency Medicine | Admitting: Emergency Medicine

## 2020-07-10 DIAGNOSIS — Z79899 Other long term (current) drug therapy: Secondary | ICD-10-CM | POA: Insufficient documentation

## 2020-07-10 DIAGNOSIS — J441 Chronic obstructive pulmonary disease with (acute) exacerbation: Secondary | ICD-10-CM | POA: Insufficient documentation

## 2020-07-10 DIAGNOSIS — Z7901 Long term (current) use of anticoagulants: Secondary | ICD-10-CM | POA: Insufficient documentation

## 2020-07-10 DIAGNOSIS — Z87891 Personal history of nicotine dependence: Secondary | ICD-10-CM | POA: Diagnosis not present

## 2020-07-10 DIAGNOSIS — I5032 Chronic diastolic (congestive) heart failure: Secondary | ICD-10-CM | POA: Diagnosis not present

## 2020-07-10 DIAGNOSIS — F0390 Unspecified dementia without behavioral disturbance: Secondary | ICD-10-CM | POA: Diagnosis not present

## 2020-07-10 DIAGNOSIS — M546 Pain in thoracic spine: Secondary | ICD-10-CM | POA: Insufficient documentation

## 2020-07-10 DIAGNOSIS — I2510 Atherosclerotic heart disease of native coronary artery without angina pectoris: Secondary | ICD-10-CM | POA: Diagnosis not present

## 2020-07-10 DIAGNOSIS — Z85038 Personal history of other malignant neoplasm of large intestine: Secondary | ICD-10-CM | POA: Diagnosis not present

## 2020-07-10 DIAGNOSIS — Z8616 Personal history of COVID-19: Secondary | ICD-10-CM | POA: Diagnosis not present

## 2020-07-10 DIAGNOSIS — J45909 Unspecified asthma, uncomplicated: Secondary | ICD-10-CM | POA: Diagnosis not present

## 2020-07-10 DIAGNOSIS — Z7951 Long term (current) use of inhaled steroids: Secondary | ICD-10-CM | POA: Diagnosis not present

## 2020-07-10 DIAGNOSIS — Z7982 Long term (current) use of aspirin: Secondary | ICD-10-CM | POA: Diagnosis not present

## 2020-07-10 DIAGNOSIS — W19XXXA Unspecified fall, initial encounter: Secondary | ICD-10-CM | POA: Diagnosis not present

## 2020-07-10 DIAGNOSIS — E860 Dehydration: Secondary | ICD-10-CM | POA: Diagnosis not present

## 2020-07-10 DIAGNOSIS — M542 Cervicalgia: Secondary | ICD-10-CM | POA: Diagnosis present

## 2020-07-10 LAB — CBC WITH DIFFERENTIAL/PLATELET
Abs Immature Granulocytes: 0.02 10*3/uL (ref 0.00–0.07)
Basophils Absolute: 0 10*3/uL (ref 0.0–0.1)
Basophils Relative: 0 %
Eosinophils Absolute: 0 10*3/uL (ref 0.0–0.5)
Eosinophils Relative: 0 %
HCT: 37.4 % (ref 36.0–46.0)
Hemoglobin: 12 g/dL (ref 12.0–15.0)
Immature Granulocytes: 0 %
Lymphocytes Relative: 10 %
Lymphs Abs: 0.7 10*3/uL (ref 0.7–4.0)
MCH: 30.2 pg (ref 26.0–34.0)
MCHC: 32.1 g/dL (ref 30.0–36.0)
MCV: 94.2 fL (ref 80.0–100.0)
Monocytes Absolute: 0.9 10*3/uL (ref 0.1–1.0)
Monocytes Relative: 12 %
Neutro Abs: 5.3 10*3/uL (ref 1.7–7.7)
Neutrophils Relative %: 78 %
Platelets: 163 10*3/uL (ref 150–400)
RBC: 3.97 MIL/uL (ref 3.87–5.11)
RDW: 14.7 % (ref 11.5–15.5)
WBC: 6.9 10*3/uL (ref 4.0–10.5)
nRBC: 0 % (ref 0.0–0.2)

## 2020-07-10 LAB — COMPREHENSIVE METABOLIC PANEL
ALT: 12 U/L (ref 0–44)
AST: 17 U/L (ref 15–41)
Albumin: 3.6 g/dL (ref 3.5–5.0)
Alkaline Phosphatase: 59 U/L (ref 38–126)
Anion gap: 8 (ref 5–15)
BUN: 21 mg/dL (ref 8–23)
CO2: 31 mmol/L (ref 22–32)
Calcium: 10.9 mg/dL — ABNORMAL HIGH (ref 8.9–10.3)
Chloride: 101 mmol/L (ref 98–111)
Creatinine, Ser: 0.83 mg/dL (ref 0.44–1.00)
GFR, Estimated: >60 mL/min (ref >60)
Glucose, Bld: 119 mg/dL — ABNORMAL HIGH (ref 70–99)
Potassium: 3.4 mmol/L — ABNORMAL LOW (ref 3.5–5.1)
Sodium: 140 mmol/L (ref 135–145)
Total Bilirubin: 0.6 mg/dL (ref 0.3–1.2)
Total Protein: 6.1 g/dL — ABNORMAL LOW (ref 6.5–8.1)

## 2020-07-10 MED ORDER — SODIUM CHLORIDE 0.9 % IV BOLUS
500.0000 mL | Freq: Once | INTRAVENOUS | Status: AC
  Administered 2020-07-10: 500 mL via INTRAVENOUS

## 2020-07-10 MED ORDER — SODIUM CHLORIDE 0.9 % IV SOLN: INTRAVENOUS | Status: DC

## 2020-07-10 NOTE — ED Triage Notes (Signed)
Pt brought her by [facility] for fall. Staff reports to EMS that pt was found lying against wall in sitting position after unwitnessed fall. Pt c/o neck and upper back pain. Staff says pt is at baseline cognitively and has hx of dementia. C-collar in place

## 2020-07-10 NOTE — ED Notes (Signed)
Pt in bed, daughter at bedside, daughter states that pt is at baseline.

## 2020-07-10 NOTE — ED Provider Notes (Addendum)
[facility] EMERGENCY DEPARTMENT Provider Note   CSN: [id number] Arrival date & time: 07/10/20  1955     History Chief Complaint  Patient presents with  . Fall    Deborah [name] is a 87 y.o. female.  Patient from [place] assisted [facility].  Has known history of dementia.  She is in the Alzheimer unit there.  Patient is a DNR.  Patient was found lying against the wall in the sitting position unwitnessed fall.  Patient complaining of neck and upper back pain.  She did arrive with c-collar in place.  No evidence of any head trauma.  Staff says it patient's baseline cognitively.  But family member who is here with her says that she is not usually what it is like this or when she is like this is due to a urinary tract infection and they want that part evaluated as well.  That will signs on arrival temp 98.2.  Blood pressure 100/63.  Heart rate 65.  Patient has a known history of atrial fibrillation patient on Eliquis.  Patient on diltiazem.  Additionally past medical history significant for coronary artery disease.  Remote history of seizures none in the past 20 years.  Alzheimer's disease.  Patient with history of macular degeneration and is essentially blind.        Past Medical History:  Diagnosis Date  . Alzheimer disease (HCC)   . Asthma   . Atrial fibrillation (HCC)    Not anticoagulated with history of bleeding and fall risk  . Chronic diastolic heart failure (HCC)   . COPD (chronic obstructive pulmonary disease) (HCC)   . Coronary atherosclerosis of native coronary artery    BMS circumflex 2009  . Dementia without behavioral disturbance (HCC)   . Drug intolerance    Flecacinide and Amiodarone   . Fracture of multiple pubic rami (HCC) 12/13   Left superior and inferior - following fall  . Hypotension   . Mixed hyperlipidemia   . Osteoarthritis   . Retroperitoneal hemorrhage 12/13   Following fall  . Seizures (HCC)    Remote history of over 20 years ago  . Umbilical  hernia     Patient Active Problem List   Diagnosis Date Noted  . Acute pulmonary embolism without acute cor pulmonale (HCC) 07/20/2019  . Sepsis due to undetermined organism (HCC) 07/18/2019  . AKI (acute kidney injury) (HCC) 07/18/2019  . Acute cystitis without hematuria   . Sepsis (HCC) 07/17/2019  . COVID-19 virus infection 07/17/2019  . Chest pain 09/02/2017  . Compression fracture of body of thoracic vertebra (HCC) 06/22/2017  . Atrial fibrillation with rapid ventricular response (HCC)   . Screening for colorectal cancer 03/24/2017  . Hemorrhoids 03/24/2017  . Itching in the vaginal area 03/24/2017  . Vulval thrush 03/24/2017  . Hypoxemia   . Alzheimer disease (HCC) 09/13/2016  . Seizures (HCC) 09/13/2016  . Hypokalemia 09/13/2016  . COPD exacerbation (HCC) 09/13/2016  . Hypotension 04/22/2012  . CAD S/P percutaneous coronary angioplasty   . COPD (chronic obstructive pulmonary disease) (HCC)   . Mixed hyperlipidemia 05/14/2009  . Permanent atrial fibrillation (HCC) 02/10/2009  . DIASTOLIC HEART FAILURE, CHRONIC 02/10/2009    Past Surgical History:  Procedure Laterality Date  . CATARACT EXTRACTION    . Radiofrequency catheter ablation     Failed  . Right carpel tunnel release    . TONSILLECTOMY       OB History    Gravida  1   Para  1  Term  1   Preterm      AB      Living  1     SAB      IAB      Ectopic      Multiple      Live Births  1           Family History  Problem Relation Age of Onset  . Coronary artery disease Mother   . Tuberculosis Paternal Grandfather   . Heart attack Maternal Grandmother     Social History   Tobacco Use  . Smoking status: Former Smoker    Packs/day: 0.80    Years: 25.00    Pack years: 20.00    Types: Cigarettes    Quit date: 05/23/1990    Years since quitting: 30.1  . Smokeless tobacco: Never Used  Vaping Use  . Vaping Use: Never used  Substance Use Topics  . Alcohol use: No    Alcohol/week:  0.0 standard drinks  . Drug use: No    Home Medications Prior to Admission medications   Medication Sig Start Date End Date Taking? Authorizing Provider  alendronate (FOSAMAX) 70 MG tablet Take 70 mg by mouth every Wednesday. Take with a full glass of water on an empty stomach.   Yes [provider]  apixaban (ELIQUIS) 5 MG TABS tablet Take 2 tablets (10 mg total) by mouth 2 (two) times daily. Then 1 tab (5 mg) two times daily starting 07/27/19 Patient taking differently: Take 5 mg by mouth 2 (two) times daily. 07/27/19  Yes Tat, Onalee Hua, MD  aspirin (ASPIRIN EC) 81 MG EC tablet Take 1 tablet (81 mg total) by mouth daily. Swallow whole. 05/04/12  Yes [name], MD  calcitRIOL (ROCALTROL) 0.5 MCG capsule Take 0.5 mcg by mouth daily. 06/18/20  Yes [provider]  calcium citrate-vitamin D 500-400 MG-UNIT chewable tablet Chew 1 tablet by mouth 2 (two) times daily.   Yes [provider]  digoxin (LANOXIN) 0.125 MG tablet Take 1 tablet (0.125 mg total) by mouth every evening. 09/21/16  Yes [name], [name], MD  diltiazem [facility]) 120 MG 24 hr capsule Take 120 mg by mouth daily. 06/18/20  Yes [provider]  docusate sodium (COLACE) 100 MG capsule Take 100 mg by mouth at bedtime.   Yes [provider]  famotidine (PEPCID) 20 MG tablet Take 20 mg by mouth daily. 06/18/20  Yes [provider]  fluticasone (FLONASE) 50 MCG/ACT nasal spray Place 2 sprays into both nostrils daily.   Yes [provider]  furosemide (LASIX) 20 MG tablet Take 10 mg by mouth 2 (two) times daily.    Yes [provider]  gabapentin (NEURONTIN) 300 MG capsule Take 300 mg by mouth at bedtime.   Yes [provider]  isosorbide mononitrate (IMDUR) 30 MG 24 hr tablet Take 0.5 tablets (15 mg total) by mouth daily. 09/03/17  Yes Erick Blinks, MD  levETIRAcetam (KEPPRA) 250 MG tablet Take 125-250 mg by mouth See admin instructions. Takes one tablet  in the morning and half a tablet at noon   Yes [provider]  levothyroxine (SYNTHROID, LEVOTHROID) 25 MCG tablet Take 25 mcg by mouth daily before breakfast.   Yes [provider]  Melatonin 3 MG TABS Take 5 mg by mouth at bedtime.    Yes [provider]  midodrine (PROAMATINE) 2.5 MG tablet Take 1 tablet (2.5 mg total) by mouth 2 (two) times daily with a meal.  The dosing can be titrated down to once daily when her systolic blood pressure is consistently in the 100s. 05/04/12  Yes [name], MD  montelukast (SINGULAIR) 10 MG tablet Take 10 mg by mouth daily.  09/13/17  Yes [provider]  Multiple Vitamins-Minerals (ICAPS) CAPS Take 1 capsule by mouth 2 (two) times daily.   Yes [provider]  potassium chloride (K-DUR,KLOR-CON) 10 MEQ tablet Take 10 mEq by mouth every other day. 05/27/18  Yes [provider]  sertraline (ZOLOFT) 25 MG tablet Take 25 mg by mouth daily.   Yes [provider]  TRELEGY ELLIPTA 100-62.5-25 MCG/INH AEPB Inhale 1 puff into the lungs daily. 07/07/20  Yes [provider]  acetaminophen (TYLENOL) 500 MG tablet Take 1,000 mg by mouth every 6 (six) hours as needed for mild pain. For pain  Patient not taking: Reported on 07/10/2020    [provider]  ALPRAZolam [name]) 0.25 MG tablet Take 1 tablet (0.25 mg total) by mouth every 8 (eight) hours as needed for anxiety. Patient not taking: No sig reported 07/21/19   [name], MD  amoxicillin-clavulanate (AUGMENTIN) 875-125 MG tablet Take 1 tablet by mouth 2 (two) times daily. X 4 days Patient not taking: No sig reported 07/21/19   Tat, Onalee Hua, MD  diltiazem (CARDIZEM) 30 MG tablet Take 1 tablet (30 mg total) by mouth every 6 (six) hours. Patient not taking: No sig reported 09/21/16   [name], [name], MD  guaiFENesin (MUCINEX) 600 MG 12 hr tablet Take 2 tablets (1,200 mg total) by mouth 2 (two) times daily. Patient taking differently:  Take 600 mg by mouth daily.  09/21/16   [name], [name], MD  ondansetron (ZOFRAN) 4 MG tablet Take 4 mg by mouth every 6 (six) hours as needed for nausea or vomiting.    [provider]  pantoprazole (PROTONIX) 40 MG tablet Take 1 tablet (40 mg total) by mouth daily. 09/29/17   Abelino Derrick, PA-C  potassium chloride (KLOR-CON) 10 MEQ tablet Take by mouth. 06/18/20   [provider]  topiramate (TOPAMAX) 25 MG tablet Take 1 tablet by mouth daily as needed (for headache).  12/17/17   [provider]    Allergies    Ivp dye [iodinated diagnostic agents], Omnipaque [iohexol], Sulfa antibiotics, Amiodarone, Carbamazepine, Dilaudid [hydromorphone], and Tramadol  Review of Systems   Review of Systems  Unable to perform ROS: Dementia    Physical Exam Updated Vital Signs BP 107/82   Pulse 69   Temp 98.2 F (36.8 C) (Oral)   Resp 19   Wt 69.1 kg   SpO2 94%   BMI 25.35 kg/m   Physical Exam Vitals and nursing note reviewed.  Constitutional:      General: She is not in acute distress.    Appearance: Normal appearance. She is well-developed and well-nourished.  HENT:     Head: Normocephalic and atraumatic.     Mouth/Throat:     Mouth: Mucous membranes are dry.     Comments: Mucous membranes dry Eyes:     Conjunctiva/sclera: Conjunctivae normal.  Neck:     Comments: Cervical collar in place Cardiovascular:     Rate and Rhythm: Normal rate and regular rhythm.     Heart sounds: No murmur heard.   Pulmonary:     Effort: Pulmonary effort is normal. No respiratory distress.     Breath sounds: Normal breath sounds.  Abdominal:     Palpations: Abdomen is soft.  Tenderness: There is no abdominal tenderness.  Musculoskeletal:        General: No edema.  Skin:    General: Skin is warm and dry.     Capillary Refill: Capillary refill takes less than 2 seconds.  Neurological:     Mental Status: She is alert.     Comments: Patient somnolent and will  awaken follow some commands.  Psychiatric:        Mood and Affect: Mood and affect normal.     ED Results / Procedures / Treatments   Labs (all labs ordered are listed, but only abnormal results are displayed) Labs Reviewed  COMPREHENSIVE METABOLIC PANEL - Abnormal; Notable for the following components:      Result Value   Potassium 3.4 (*)    Glucose, Bld 119 (*)    Calcium 10.9 (*)    Total Protein 6.1 (*)    All other components within normal limits  URINALYSIS, ROUTINE W REFLEX MICROSCOPIC - Abnormal; Notable for the following components:   APPearance HAZY (*)    All other components within normal limits  CBC WITH DIFFERENTIAL/PLATELET   Results for orders placed or performed during the hospital encounter of 07/10/20  Comprehensive metabolic panel  Result Value Ref Range   Sodium 140 135 - 145 mmol/L   Potassium 3.4 (L) 3.5 - 5.1 mmol/L   Chloride 101 98 - 111 mmol/L   CO2 31 22 - 32 mmol/L   Glucose, Bld 119 (H) 70 - 99 mg/dL   BUN 21 8 - 23 mg/dL   Creatinine, Ser 1.61 0.44 - 1.00 mg/dL   Calcium 09.6 (H) 8.9 - 10.3 mg/dL   Total Protein 6.1 (L) 6.5 - 8.1 g/dL   Albumin 3.6 3.5 - 5.0 g/dL   AST 17 15 - 41 U/L   ALT 12 0 - 44 U/L   Alkaline Phosphatase 59 38 - 126 U/L   Total Bilirubin 0.6 0.3 - 1.2 mg/dL   GFR, Estimated >04 >54 mL/min   Anion gap 8 5 - 15  CBC with Differential/Platelet  Result Value Ref Range   WBC 6.9 4.0 - 10.5 K/uL   RBC 3.97 3.87 - 5.11 MIL/uL   Hemoglobin 12.0 12.0 - 15.0 g/dL   HCT 09.8 11.9 - 14.7 %   MCV 94.2 80.0 - 100.0 fL   MCH 30.2 26.0 - 34.0 pg   MCHC 32.1 30.0 - 36.0 g/dL   RDW 82.9 56.2 - 13.0 %   Platelets 163 150 - 400 K/uL   nRBC 0.0 0.0 - 0.2 %   Neutrophils Relative % 78 %   Neutro Abs 5.3 1.7 - 7.7 K/uL   Lymphocytes Relative 10 %   Lymphs Abs 0.7 0.7 - 4.0 K/uL   Monocytes Relative 12 %   Monocytes Absolute 0.9 0.1 - 1.0 K/uL   Eosinophils Relative 0 %   Eosinophils Absolute 0.0 0.0 - 0.5 K/uL   Basophils  Relative 0 %   Basophils Absolute 0.0 0.0 - 0.1 K/uL   Immature Granulocytes 0 %   Abs Immature Granulocytes 0.02 0.00 - 0.07 K/uL  Urinalysis, Routine w reflex microscopic Urine, Catheterized  Result Value Ref Range   Color, Urine YELLOW YELLOW   APPearance HAZY (A) CLEAR   Specific Gravity, Urine 1.013 1.005 - 1.030   pH 6.0 5.0 - 8.0   Glucose, UA NEGATIVE NEGATIVE mg/dL   Hgb urine dipstick NEGATIVE NEGATIVE   Bilirubin Urine NEGATIVE NEGATIVE   Ketones, ur NEGATIVE NEGATIVE  mg/dL   Protein, ur NEGATIVE NEGATIVE mg/dL   Nitrite NEGATIVE NEGATIVE   Leukocytes,Ua NEGATIVE NEGATIVE     EKG EKG Interpretation  Date/Time:  Friday July 10 2020 21:29:51 EST Ventricular Rate:  57 PR Interval:    QRS Duration: 83 QT Interval:  351 QTC Calculation: 342 R Axis:   71 Text Interpretation: Atrial fibrillation Ventricular premature complex Repol abnrm suggests ischemia, diffuse leads Confirmed by Vanetta Mulders [phone]) on 07/10/2020 10:01:53 PM   Radiology DG Thoracic Spine 2 View  Result Date: 07/10/2020 CLINICAL DATA:  Fall EXAM: THORACIC SPINE 2 VIEWS COMPARISON:  06/21/2017 FINDINGS: Unchanged chronic compression deformity of T7. No acute fracture. Alignment is normal. IMPRESSION: Unchanged chronic T7 compression fracture. No acute thoracic spine fracture. Electronically Signed   By: Deatra Robinson M.D.   On: 07/10/2020 22:57   DG Lumbar Spine Complete  Result Date: 07/10/2020 CLINICAL DATA:  Fall EXAM: LUMBAR SPINE - COMPLETE 4+ VIEW COMPARISON:  None. FINDINGS: There is no evidence of lumbar spine fracture. Alignment is normal. Intervertebral disc spaces are maintained. IMPRESSION: Negative. Electronically Signed   By: Deatra Robinson M.D.   On: 07/10/2020 22:55   CT Head Wo Contrast  Result Date: 07/10/2020 CLINICAL DATA:  Status post trauma. EXAM: CT HEAD WITHOUT CONTRAST TECHNIQUE: Contiguous axial images were obtained from the base of the skull through the vertex without  intravenous contrast. COMPARISON:  None. FINDINGS: Brain: There is mild cerebral atrophy with widening of the extra-axial spaces and ventricular dilatation. There are areas of decreased attenuation within the white matter tracts of the supratentorial brain, consistent with microvascular disease changes. Vascular: No hyperdense vessel or unexpected calcification. Skull: Normal. Negative for fracture or focal lesion. Sinuses/Orbits: No acute finding. Other: None. IMPRESSION: 1. Generalized cerebral atrophy. 2. No acute intracranial abnormality. Electronically Signed   By: Aram Candela M.D.   On: 07/10/2020 23:00   CT Cervical Spine Wo Contrast  Result Date: 07/10/2020 CLINICAL DATA:  Status post trauma. EXAM: CT CERVICAL SPINE WITHOUT CONTRAST TECHNIQUE: Multidetector CT imaging of the cervical spine was performed without intravenous contrast. Multiplanar CT image reconstructions were also generated. COMPARISON:  None. FINDINGS: Alignment: Normal. Skull base and vertebrae: No acute fracture. A benign appearing 5 mm diameter sclerotic focus is seen within the inferior aspect of the T1 vertebral body on the left. Soft tissues and spinal canal: No prevertebral fluid or swelling. No visible canal hematoma. Disc levels: Marked severity endplate sclerosis is seen at the levels of C4-C5 and C6-C7. Marked severity intervertebral disc space narrowing is also seen at the levels of C4-C5 and C6-C7. Mild to moderate severity intervertebral disc space narrowing is present at the level of C5-C6. Moderate to marked severity bilateral multilevel facet joint hypertrophy is noted. Upper chest: A chronic appearing deformity is seen involving the second right rib. Mild to moderate severity right apical scarring and/or atelectasis is seen. Other: The study is limited secondary to patient motion. IMPRESSION: 1. No acute osseous abnormality of the cervical spine. 2. Marked severity multilevel degenerative changes, most prominent at  the levels of C4-C5 and C6-C7. 3. Mild to moderate severity right apical scarring and/or atelectasis. Further evaluation with a nonemergent chest CT is recommended. Electronically Signed   By: Aram Candela M.D.   On: 07/10/2020 23:05   DG Chest Port 1 View  Result Date: 07/10/2020 CLINICAL DATA:  Altered mental status EXAM: PORTABLE CHEST 1 VIEW COMPARISON:  07/17/2019 FINDINGS: Mild cardiomegaly with bilateral mild interstitial opacity. No pleural effusion  or overt edema. No focal airspace consolidation. IMPRESSION: Cardiomegaly without overt pulmonary edema. Electronically Signed   By: Deatra RobinsonKevin  Herman M.D.   On: 07/10/2020 22:54   DG Hips Bilat W or Wo Pelvis 3-4 Views  Result Date: 07/10/2020 CLINICAL DATA:  Fall EXAM: DG HIP (WITH OR WITHOUT PELVIS) 3-4V BILAT COMPARISON:  04/21/2012 FINDINGS: There is no evidence of hip fracture or dislocation. There is no evidence of arthropathy or other focal bone abnormality. Old fracture of the left pubic bone. IMPRESSION: Negative. Electronically Signed   By: Deatra RobinsonKevin  Herman M.D.   On: 07/10/2020 22:58    Procedures Procedures   Medications Ordered in ED Medications  0.9 %  sodium chloride infusion ( Intravenous New Bag/Given 07/10/20 2155)  sodium chloride 0.9 % bolus 500 mL (0 mLs Intravenous Stopped 07/10/20 2155)    ED Course  I have reviewed the triage vital signs and the nursing notes.  Pertinent labs & imaging results that were available during my care of the patient were reviewed by me and considered in my medical decision making (see chart for details).    MDM Rules/Calculators/A&P                         Urinalysis still pending.  Patient given IV fluids.  Because.  Clinically dehydrated.  Patient's BUN and creatinine though is normal.  GFR is greater than 60.  EKG shows atrial fibrillation rate controlled.  CT head CT cervical spine still pending.  Chest x-ray and x-ray of thoracic lumbar and pelvis and bilateral hips secondary to  fall still pending.  Concern for family member is that she may have a urinary tract infection  CT head and neck without any acute findings.  X-rays of thoracic and lumbar spine without any acute bony abnormalities.  Evidence of an old T8 compression fracture.  X-rays of the pelvis and hips is negative as well.  No evidence of any acute injury related to the fall.  Urinalysis is still pending.  Urinalysis is normal.  Patient showing signs of more alertness.  Patient stable for discharge back to facility.  Final Clinical Impression(s) / ED Diagnoses Final diagnoses:  Fall, initial encounter  Dehydration    Rx / DC Orders ED Discharge Orders    None       Vanetta MuldersZackowski, Juliya Magill, MD 07/10/20 [id number]    Vanetta MuldersZackowski, Katrese Shell, MD 07/10/20 2320    Vanetta MuldersZackowski, Criss Pallone, MD 07/11/20 [phone]    Vanetta MuldersZackowski, Clare Casto, MD 07/11/20 [phone]

## 2020-07-11 LAB — URINALYSIS, ROUTINE W REFLEX MICROSCOPIC
Bilirubin Urine: NEGATIVE
Glucose, UA: NEGATIVE mg/dL
Hgb urine dipstick: NEGATIVE
Ketones, ur: NEGATIVE mg/dL
Leukocytes,Ua: NEGATIVE
Nitrite: NEGATIVE
Protein, ur: NEGATIVE mg/dL
Specific Gravity, Urine: 1.013 (ref 1.005–1.030)
pH: 6 (ref 5.0–8.0)

## 2020-07-11 NOTE — Discharge Instructions (Addendum)
Work-up here without any acute findings from the fall.  May be some mild dehydration.  Urinalysis negative.  Patient stable for discharge back to facility.

## 2020-08-31 ENCOUNTER — Encounter (HOSPITAL_COMMUNITY): Payer: Self-pay | Admitting: Emergency Medicine

## 2020-08-31 ENCOUNTER — Emergency Department (HOSPITAL_COMMUNITY): Payer: Medicare Other

## 2020-08-31 ENCOUNTER — Inpatient Hospital Stay (HOSPITAL_COMMUNITY)
Admission: EM | Admit: 2020-08-31 | Discharge: 2020-09-02 | DRG: 640 | Disposition: A | Discharge status: Home Health | Payer: Medicare Other | Source: Skilled Nursing Facility | Attending: Internal Medicine | Admitting: Internal Medicine

## 2020-08-31 ENCOUNTER — Other Ambulatory Visit: Payer: Self-pay

## 2020-08-31 DIAGNOSIS — E782 Mixed hyperlipidemia: Secondary | ICD-10-CM | POA: Diagnosis present

## 2020-08-31 DIAGNOSIS — G309 Alzheimer's disease, unspecified: Secondary | ICD-10-CM | POA: Diagnosis present

## 2020-08-31 DIAGNOSIS — Z87891 Personal history of nicotine dependence: Secondary | ICD-10-CM

## 2020-08-31 DIAGNOSIS — R569 Unspecified convulsions: Secondary | ICD-10-CM | POA: Diagnosis present

## 2020-08-31 DIAGNOSIS — Z888 Allergy status to other drugs, medicaments and biological substances status: Secondary | ICD-10-CM

## 2020-08-31 DIAGNOSIS — I4821 Permanent atrial fibrillation: Secondary | ICD-10-CM | POA: Diagnosis present

## 2020-08-31 DIAGNOSIS — I5033 Acute on chronic diastolic (congestive) heart failure: Secondary | ICD-10-CM

## 2020-08-31 DIAGNOSIS — G9340 Encephalopathy, unspecified: Secondary | ICD-10-CM | POA: Diagnosis not present

## 2020-08-31 DIAGNOSIS — Z882 Allergy status to sulfonamides status: Secondary | ICD-10-CM

## 2020-08-31 DIAGNOSIS — E876 Hypokalemia: Secondary | ICD-10-CM | POA: Diagnosis present

## 2020-08-31 DIAGNOSIS — Z79899 Other long term (current) drug therapy: Secondary | ICD-10-CM

## 2020-08-31 DIAGNOSIS — R531 Weakness: Secondary | ICD-10-CM

## 2020-08-31 DIAGNOSIS — I10 Essential (primary) hypertension: Secondary | ICD-10-CM

## 2020-08-31 DIAGNOSIS — I5032 Chronic diastolic (congestive) heart failure: Secondary | ICD-10-CM | POA: Diagnosis present

## 2020-08-31 DIAGNOSIS — F0280 Dementia in other diseases classified elsewhere without behavioral disturbance: Secondary | ICD-10-CM | POA: Diagnosis present

## 2020-08-31 DIAGNOSIS — Z7901 Long term (current) use of anticoagulants: Secondary | ICD-10-CM

## 2020-08-31 DIAGNOSIS — Z20822 Contact with and (suspected) exposure to covid-19: Secondary | ICD-10-CM | POA: Diagnosis present

## 2020-08-31 DIAGNOSIS — E860 Dehydration: Secondary | ICD-10-CM | POA: Diagnosis present

## 2020-08-31 DIAGNOSIS — G9341 Metabolic encephalopathy: Secondary | ICD-10-CM | POA: Diagnosis present

## 2020-08-31 DIAGNOSIS — Z7983 Long term (current) use of bisphosphonates: Secondary | ICD-10-CM

## 2020-08-31 DIAGNOSIS — Z885 Allergy status to narcotic agent status: Secondary | ICD-10-CM

## 2020-08-31 DIAGNOSIS — Z91041 Radiographic dye allergy status: Secondary | ICD-10-CM

## 2020-08-31 DIAGNOSIS — I2510 Atherosclerotic heart disease of native coronary artery without angina pectoris: Secondary | ICD-10-CM | POA: Diagnosis present

## 2020-08-31 DIAGNOSIS — Z7982 Long term (current) use of aspirin: Secondary | ICD-10-CM

## 2020-08-31 DIAGNOSIS — Z8249 Family history of ischemic heart disease and other diseases of the circulatory system: Secondary | ICD-10-CM

## 2020-08-31 DIAGNOSIS — J449 Chronic obstructive pulmonary disease, unspecified: Secondary | ICD-10-CM | POA: Diagnosis present

## 2020-08-31 DIAGNOSIS — Z79890 Hormone replacement therapy (postmenopausal): Secondary | ICD-10-CM

## 2020-08-31 DIAGNOSIS — Z8616 Personal history of COVID-19: Secondary | ICD-10-CM

## 2020-08-31 DIAGNOSIS — Z66 Do not resuscitate: Secondary | ICD-10-CM | POA: Diagnosis present

## 2020-08-31 DIAGNOSIS — I110 Hypertensive heart disease with heart failure: Secondary | ICD-10-CM | POA: Diagnosis present

## 2020-08-31 DIAGNOSIS — E039 Hypothyroidism, unspecified: Secondary | ICD-10-CM | POA: Diagnosis present

## 2020-08-31 LAB — CBC WITH DIFFERENTIAL/PLATELET
Abs Immature Granulocytes: 0.02 10*3/uL (ref 0.00–0.07)
Basophils Absolute: 0.1 10*3/uL (ref 0.0–0.1)
Basophils Relative: 1 %
Eosinophils Absolute: 0.1 10*3/uL (ref 0.0–0.5)
Eosinophils Relative: 1 %
HCT: 40.4 % (ref 36.0–46.0)
Hemoglobin: 13 g/dL (ref 12.0–15.0)
Immature Granulocytes: 0 %
Lymphocytes Relative: 17 %
Lymphs Abs: 0.8 10*3/uL (ref 0.7–4.0)
MCH: 30.3 pg (ref 26.0–34.0)
MCHC: 32.2 g/dL (ref 30.0–36.0)
MCV: 94.2 fL (ref 80.0–100.0)
Monocytes Absolute: 0.5 10*3/uL (ref 0.1–1.0)
Monocytes Relative: 9 %
Neutro Abs: 3.6 10*3/uL (ref 1.7–7.7)
Neutrophils Relative %: 72 %
Platelets: 192 10*3/uL (ref 150–400)
RBC: 4.29 MIL/uL (ref 3.87–5.11)
RDW: 14.6 % (ref 11.5–15.5)
WBC: 5 10*3/uL (ref 4.0–10.5)
nRBC: 0 % (ref 0.0–0.2)

## 2020-08-31 LAB — COMPREHENSIVE METABOLIC PANEL
ALT: 14 U/L (ref 0–44)
AST: 21 U/L (ref 15–41)
Albumin: 4.2 g/dL (ref 3.5–5.0)
Alkaline Phosphatase: 69 U/L (ref 38–126)
Anion gap: 12 (ref 5–15)
BUN: 25 mg/dL — ABNORMAL HIGH (ref 8–23)
CO2: 29 mmol/L (ref 22–32)
Calcium: 11.6 mg/dL — ABNORMAL HIGH (ref 8.9–10.3)
Chloride: 100 mmol/L (ref 98–111)
Creatinine, Ser: 1.06 mg/dL — ABNORMAL HIGH (ref 0.44–1.00)
GFR, Estimated: 49 mL/min — ABNORMAL LOW (ref >60)
Glucose, Bld: 105 mg/dL — ABNORMAL HIGH (ref 70–99)
Potassium: 3.1 mmol/L — ABNORMAL LOW (ref 3.5–5.1)
Sodium: 141 mmol/L (ref 135–145)
Total Bilirubin: 0.4 mg/dL (ref 0.3–1.2)
Total Protein: 7 g/dL (ref 6.5–8.1)

## 2020-08-31 LAB — LACTIC ACID, PLASMA
Lactic Acid, Venous: 1.2 mmol/L (ref 0.5–1.9)
Lactic Acid, Venous: 1.1 mmol/L (ref 0.5–1.9)

## 2020-08-31 LAB — PROTIME-INR
INR: 1.5 — ABNORMAL HIGH (ref 0.8–1.2)
Prothrombin Time: 17.2 seconds — ABNORMAL HIGH (ref 11.4–15.2)

## 2020-08-31 LAB — APTT: aPTT: 30 seconds (ref 24–36)

## 2020-08-31 LAB — TROPONIN I (HIGH SENSITIVITY): Troponin I (High Sensitivity): 14 ng/L (ref <18)

## 2020-08-31 MED ORDER — LACTATED RINGERS IV BOLUS (SEPSIS)
500.0000 mL | Freq: Once | INTRAVENOUS | Status: AC
  Administered 2020-08-31: 500 mL via INTRAVENOUS

## 2020-08-31 MED ORDER — LACTATED RINGERS IV SOLN: INTRAVENOUS | Status: DC

## 2020-08-31 MED ORDER — SODIUM CHLORIDE 0.9 % IV SOLN
1.0000 g | INTRAVENOUS | Status: DC
  Administered 2020-08-31: 1 g via INTRAVENOUS
  Filled 2020-08-31: qty 10

## 2020-08-31 NOTE — ED Notes (Signed)
First set of cultures obtained by RN, lab to draw second set.

## 2020-08-31 NOTE — ED Triage Notes (Signed)
Nursing home sent pt over for weakness. Per ems pt hypotensive.

## 2020-08-31 NOTE — Progress Notes (Signed)
Code Sepsis initiated @ 2244 PM, Elink following.  

## 2020-08-31 NOTE — ED Notes (Signed)
Second set of blood cultures obtained before starting antibiotic infusion

## 2020-08-31 NOTE — ED Notes (Signed)
ED Provider at bedside. 

## 2020-08-31 NOTE — ED Provider Notes (Signed)
[facility] EMERGENCY DEPARTMENT Provider Note   CSN: [id number] Arrival date & time: 08/31/20  1836     History No chief complaint on file.   Deborah [name] is a 87 y.o. female.  HPI    Pt with hx of dementia, copd, cad and AF comes in with cc of confusion. She normally is able to use the walker and is pretty functional. Today, she was found in the corner with her pants down and was defecating - which is not normal. Pt was profoundly weak as well. Pt has been confused the last few days and had a UA ordered. She has had uti with confusion in the past.  Reduced po intake per nursing home as well.  I spoke with patient's daughter who arrives later on.  She reports that she was informed that the UA was normal.  She has noticed gradual decline in her mother over the last month, more profound in the last 2 weeks.  Patient is more sleepy and also has had difficulty identifying family members.  Past Medical History:  Diagnosis Date  . Alzheimer disease (HCC)   . Asthma   . Atrial fibrillation (HCC)    Not anticoagulated with history of bleeding and fall risk  . Chronic diastolic heart failure (HCC)   . COPD (chronic obstructive pulmonary disease) (HCC)   . Coronary atherosclerosis of native coronary artery    BMS circumflex 2009  . Dementia without behavioral disturbance (HCC)   . Drug intolerance    Flecacinide and Amiodarone   . Fracture of multiple pubic rami (HCC) 12/13   Left superior and inferior - following fall  . Hypotension   . Mixed hyperlipidemia   . Osteoarthritis   . Retroperitoneal hemorrhage 12/13   Following fall  . Seizures (HCC)    Remote history of over 20 years ago  . Umbilical hernia     Patient Active Problem List   Diagnosis Date Noted  . Acute pulmonary embolism without acute cor pulmonale (HCC) 07/20/2019  . Sepsis due to undetermined organism (HCC) 07/18/2019  . AKI (acute kidney injury) (HCC) 07/18/2019  . Acute cystitis without hematuria   .  Sepsis (HCC) 07/17/2019  . COVID-19 virus infection 07/17/2019  . Chest pain 09/02/2017  . Compression fracture of body of thoracic vertebra (HCC) 06/22/2017  . Atrial fibrillation with rapid ventricular response (HCC)   . Screening for colorectal cancer 03/24/2017  . Hemorrhoids 03/24/2017  . Itching in the vaginal area 03/24/2017  . Vulval thrush 03/24/2017  . Hypoxemia   . Alzheimer disease (HCC) 09/13/2016  . Seizures (HCC) 09/13/2016  . Hypokalemia 09/13/2016  . COPD exacerbation (HCC) 09/13/2016  . Hypotension 04/22/2012  . CAD S/P percutaneous coronary angioplasty   . COPD (chronic obstructive pulmonary disease) (HCC)   . Mixed hyperlipidemia 05/14/2009  . Permanent atrial fibrillation (HCC) 02/10/2009  . DIASTOLIC HEART FAILURE, CHRONIC 02/10/2009    Past Surgical History:  Procedure Laterality Date  . CATARACT EXTRACTION    . Radiofrequency catheter ablation     Failed  . Right carpel tunnel release    . TONSILLECTOMY       OB History    Gravida  1   Para  1   Term  1   Preterm      AB      Living  1     SAB      IAB      Ectopic      Multiple  Live Births  1           Family History  Problem Relation Age of Onset  . Coronary artery disease Mother   . Tuberculosis Paternal Grandfather   . Heart attack Maternal Grandmother     Social History   Tobacco Use  . Smoking status: Former Smoker    Packs/day: 0.80    Years: 25.00    Pack years: 20.00    Types: Cigarettes    Quit date: 05/23/1990    Years since quitting: 30.2  . Smokeless tobacco: Never Used  Vaping Use  . Vaping Use: Never used  Substance Use Topics  . Alcohol use: No    Alcohol/week: 0.0 standard drinks  . Drug use: No    Home Medications Prior to Admission medications   Medication Sig Start Date End Date Taking? Authorizing Provider  acetaminophen (TYLENOL) 500 MG tablet Take 1,000 mg by mouth every 6 (six) hours as needed for mild pain. For pain  Patient  not taking: Reported on 07/10/2020    [provider]  alendronate (FOSAMAX) 70 MG tablet Take 70 mg by mouth every Wednesday. Take with a full glass of water on an empty stomach.    [provider]  ALPRAZolam [name]) 0.25 MG tablet Take 1 tablet (0.25 mg total) by mouth every 8 (eight) hours as needed for anxiety. Patient not taking: No sig reported 07/21/19   [name], MD  amoxicillin-clavulanate (AUGMENTIN) 875-125 MG tablet Take 1 tablet by mouth 2 (two) times daily. X 4 days Patient not taking: No sig reported 07/21/19   Tat, Onalee Hua, MD  apixaban (ELIQUIS) 5 MG TABS tablet Take 2 tablets (10 mg total) by mouth 2 (two) times daily. Then 1 tab (5 mg) two times daily starting 07/27/19 Patient taking differently: Take 5 mg by mouth 2 (two) times daily. 07/27/19   [name], MD  aspirin (ASPIRIN EC) 81 MG EC tablet Take 1 tablet (81 mg total) by mouth daily. Swallow whole. 05/04/12   [name], MD  calcitRIOL (ROCALTROL) 0.5 MCG capsule Take 0.5 mcg by mouth daily. 06/18/20   [provider]  calcium citrate-vitamin D 500-400 MG-UNIT chewable tablet Chew 1 tablet by mouth 2 (two) times daily.    [provider]  digoxin (LANOXIN) 0.125 MG tablet Take 1 tablet (0.125 mg total) by mouth every evening. 09/21/16   [name], [name], MD  diltiazem (CARDIZEM) 30 MG tablet Take 1 tablet (30 mg total) by mouth every 6 (six) hours. Patient not taking: No sig reported 09/21/16   [name], [name], MD  diltiazem [facility]) 120 MG 24 hr capsule Take 120 mg by mouth daily. 06/18/20   [provider]  docusate sodium (COLACE) 100 MG capsule Take 100 mg by mouth at bedtime.    [provider]  famotidine (PEPCID) 20 MG tablet Take 20 mg by mouth daily. 06/18/20   [provider]  fluticasone (FLONASE) 50 MCG/ACT nasal spray Place 2 sprays into both nostrils daily.    [provider]  furosemide (LASIX) 20 MG tablet Take 10 mg by  mouth 2 (two) times daily.     [provider]  gabapentin (NEURONTIN) 300 MG capsule Take 300 mg by mouth at bedtime.    [provider]  guaiFENesin (MUCINEX) 600 MG 12 hr tablet Take 2 tablets (1,200 mg total) by mouth 2 (two) times daily. Patient taking differently: Take 600 mg by mouth daily.  09/21/16   Ardyth Harps  Priscella Mann, MD  isosorbide mononitrate (IMDUR) 30 MG 24 hr tablet Take 0.5 tablets (15 mg total) by mouth daily. 09/03/17   Erick Blinks, MD  levETIRAcetam (KEPPRA) 250 MG tablet Take 125-250 mg by mouth See admin instructions. Takes one tablet in the morning and half a tablet at noon    [provider]  levothyroxine (SYNTHROID, LEVOTHROID) 25 MCG tablet Take 25 mcg by mouth daily before breakfast.    [provider]  Melatonin 3 MG TABS Take 5 mg by mouth at bedtime.     [provider]  midodrine (PROAMATINE) 2.5 MG tablet Take 1 tablet (2.5 mg total) by mouth 2 (two) times daily with a meal. The dosing can be titrated down to once daily when her systolic blood pressure is consistently in the 100s. 05/04/12   [name], MD  montelukast (SINGULAIR) 10 MG tablet Take 10 mg by mouth daily.  09/13/17   [provider]  Multiple Vitamins-Minerals (ICAPS) CAPS Take 1 capsule by mouth 2 (two) times daily.    [provider]  ondansetron (ZOFRAN) 4 MG tablet Take 4 mg by mouth every 6 (six) hours as needed for nausea or vomiting.    [provider]  pantoprazole (PROTONIX) 40 MG tablet Take 1 tablet (40 mg total) by mouth daily. 09/29/17   Abelino Derrick, PA-C  potassium chloride (K-DUR,KLOR-CON) 10 MEQ tablet Take 10 mEq by mouth every other day. 05/27/18   [provider]  potassium chloride (KLOR-CON) 10 MEQ tablet Take by mouth. 06/18/20   [provider]  sertraline (ZOLOFT) 25 MG tablet Take 25 mg by mouth daily.    [provider]  topiramate (TOPAMAX) 25 MG tablet Take 1 tablet  by mouth daily as needed (for headache).  12/17/17   [provider]  TRELEGY ELLIPTA 100-62.5-25 MCG/INH AEPB Inhale 1 puff into the lungs daily. 07/07/20   [provider]    Allergies    Ivp dye [iodinated diagnostic agents], Omnipaque [iohexol], Sulfa antibiotics, Amiodarone, Carbamazepine, Dilaudid [hydromorphone], and Tramadol  Review of Systems   Review of Systems  Unable to perform ROS: Dementia    Physical Exam Updated Vital Signs BP (!) 137/57   Pulse 64   Temp 98.5 F (36.9 C) (Oral)   Resp 17   Ht 5\' 5"  (1.651 m)   Wt 70 kg   SpO2 92%   BMI 25.68 kg/m   Physical Exam Vitals and nursing note reviewed.  Constitutional:      Appearance: She is well-developed.     Comments: Somnolent  HENT:     Head: Normocephalic and atraumatic.  Cardiovascular:     Rate and Rhythm: Normal rate.  Pulmonary:     Effort: Pulmonary effort is normal.  Abdominal:     General: Bowel sounds are normal.  Musculoskeletal:     Cervical back: Normal range of motion and neck supple.  Skin:    General: Skin is warm and dry.  Neurological:     Comments: Oriented to self and location, moving all 4 extremities     ED Results / Procedures / Treatments   Labs (all labs ordered are listed, but only abnormal results are displayed) Labs Reviewed  COMPREHENSIVE METABOLIC PANEL - Abnormal; Notable for the following components:      Result Value   Potassium 3.1 (*)    Glucose, Bld 105 (*)    BUN 25 (*)    Creatinine, Ser 1.06 (*)  Calcium 11.6 (*)    GFR, Estimated 49 (*)    All other components within normal limits  PROTIME-INR - Abnormal; Notable for the following components:   Prothrombin Time 17.2 (*)    INR 1.5 (*)    All other components within normal limits  URINE CULTURE  CULTURE, BLOOD (ROUTINE X 2)  CULTURE, BLOOD (ROUTINE X 2)  RESP PANEL BY RT-PCR (FLU A&B, COVID) ARPGX2  LACTIC ACID, PLASMA  LACTIC ACID, PLASMA  CBC WITH DIFFERENTIAL/PLATELET   APTT  URINALYSIS, ROUTINE W REFLEX MICROSCOPIC  TROPONIN I (HIGH SENSITIVITY)  TROPONIN I (HIGH SENSITIVITY)    EKG EKG Interpretation  Date/Time:  Monday August 31 2020 22:46:57 EDT Ventricular Rate:  61 PR Interval:    QRS Duration: 101 QT Interval:  369 QTC Calculation: 372 R Axis:   32 Text Interpretation: Atrial fibrillation Probable LVH with secondary repol abnrm Anterior ST elevation, probably due to LVH diffuse ST depression Confirmed by Derwood Kaplan ([id number]) on 09/01/2020 12:14:25 AM    EKG Interpretation  Date/Time:  Monday August 31 2020 22:46:57 EDT Ventricular Rate:  61 PR Interval:    QRS Duration: 101 QT Interval:  369 QTC Calculation: 372 R Axis:   32 Text Interpretation: Atrial fibrillation Probable LVH with secondary repol abnrm Anterior ST elevation, probably due to LVH diffuse ST depression Confirmed by Derwood Kaplan [phone]) on 09/01/2020 12:14:25 AM        Radiology CT Head Wo Contrast  Result Date: 08/31/2020 CLINICAL DATA:  Altered mental status, hypotension EXAM: CT HEAD WITHOUT CONTRAST TECHNIQUE: Contiguous axial images were obtained from the base of the skull through the vertex without intravenous contrast. COMPARISON:  07/10/2020 FINDINGS: Brain: Normal anatomic configuration. Moderate parenchymal volume loss is commensurate with the patient's age. Moderate subcortical and periventricular white matter changes are present likely reflecting the sequela of small vessel ischemia. Remote pontine lacunar infarct noted. No abnormal intra or extra-axial mass lesion or fluid collection. No abnormal mass effect or midline shift. No evidence of acute intracranial hemorrhage or infarct. Mild ventriculomegaly is commensurate with the degree of parenchymal volume loss and likely reflects the sequela of central atrophy. Cerebellum unremarkable. Vascular: No asymmetric hyperdense vasculature at the skull base. Skull: Intact Sinuses/Orbits: Paranasal sinuses are  clear. Orbits are unremarkable. Other: Mastoid air cells and middle ear cavities are clear. IMPRESSION: No acute intracranial abnormality. Advanced senescent changes, stable since prior examination. Remote right pontine infarct. Electronically Signed   By: Helyn Numbers MD   On: 08/31/2020 22:19   DG Chest Port 1 View  Result Date: 08/31/2020 CLINICAL DATA:  Confusion.  Possible sepsis. EXAM: PORTABLE CHEST 1 VIEW COMPARISON:  Chest x-ray dated July 10, 2020. FINDINGS: Unchanged cardiomegaly. Normal pulmonary vascularity. Unchanged biapical pleuroparenchymal scarring medially. No focal consolidation, pleural effusion, or pneumothorax. No acute osseous abnormality. IMPRESSION: 1. No active disease. Electronically Signed   By: Obie Dredge M.D.   On: 08/31/2020 21:40    Procedures .Critical Care Performed by: Derwood Kaplan, MD Authorized by: Derwood Kaplan, MD   Critical care provider statement:    Critical care time (minutes):  56   Critical care was necessary to treat or prevent imminent or life-threatening deterioration of the following conditions:  CNS failure or compromise   Critical care was time spent personally by me on the following activities:  Discussions with consultants, evaluation of patient's response to treatment, examination of patient, ordering and performing treatments and interventions, ordering and review of laboratory studies, ordering and review of  radiographic studies, pulse oximetry, re-evaluation of patient's condition, obtaining history from patient or surrogate and review of old charts     Medications Ordered in ED Medications  lactated ringers infusion ( Intravenous New Bag/Given 08/31/20 2358)  cefTRIAXone (ROCEPHIN) 1 g in sodium chloride 0.9 % 100 mL IVPB (0 g Intravenous Stopped 08/31/20 2358)  lactated ringers bolus 500 mL (0 mLs Intravenous Stopped 08/31/20 2211)    ED Course  I have reviewed the triage vital signs and the nursing  notes.  Pertinent labs & imaging results that were available during my care of the patient were reviewed by me and considered in my medical decision making (see chart for details).    MDM Rules/Calculators/A&P                         87 year old female comes in a chief complaint of altered mental status  DDx includes: ICH / Stroke ACS Sepsis syndrome Infection - UTI/Pneumonia Encephalopathy  Electrolyte abnormality Drug overdose DKA Metabolic disorders including thyroid disorders, adrenal insufficiency Cancer of unknown origin / paraneoplastic process Hypercapnia / COPD Hypoxia  87 year old female comes in a chief complaint of altered mental status.  It appears that she has had gradual decline over the last month, with more so confusion in the last 2 weeks.  She is also weaker than usual.  Today she had an episode where she was defecating in the hallway which is highly unusual for her and she was unable to dress herself which was highly unusual.  She has had UTI in the past with confusion.  Weakness is new.  She has had reduced p.o. intake as well.  Nursing home states that they have sent a UA.  Initial impression is that she likely had a delirium spell.  UTI have caused her to have confusion so that is possible along with electrolyte abnormality, COVID-19.  CT head ordered to ensure there is no bleed.  Later on inpatient stay, I was informed by the daughter that the UA is negative.  However, UTI still possible.  She has received IV antibiotics right now.  CT scan of the brain did show some right pontine infarct.  The stroke is old, and the patient started having symptoms few weeks back, so is possible that her decline was result of this infarct.  EKG had some global ST depression.  No acute changes on repeat.  Patient is DNR.  Final Clinical Impression(s) / ED Diagnoses Final diagnoses:  Acute encephalopathy    Rx / DC Orders ED Discharge Orders    None       Derwood KaplanNanavati,  Izell Labat, MD 09/01/20 [phone]

## 2020-09-01 DIAGNOSIS — I110 Hypertensive heart disease with heart failure: Secondary | ICD-10-CM | POA: Diagnosis present

## 2020-09-01 DIAGNOSIS — F0280 Dementia in other diseases classified elsewhere without behavioral disturbance: Secondary | ICD-10-CM | POA: Diagnosis present

## 2020-09-01 DIAGNOSIS — E782 Mixed hyperlipidemia: Secondary | ICD-10-CM | POA: Diagnosis present

## 2020-09-01 DIAGNOSIS — Z7901 Long term (current) use of anticoagulants: Secondary | ICD-10-CM | POA: Diagnosis not present

## 2020-09-01 DIAGNOSIS — G9340 Encephalopathy, unspecified: Secondary | ICD-10-CM | POA: Diagnosis present

## 2020-09-01 DIAGNOSIS — E860 Dehydration: Secondary | ICD-10-CM

## 2020-09-01 DIAGNOSIS — I10 Essential (primary) hypertension: Secondary | ICD-10-CM

## 2020-09-01 DIAGNOSIS — Z885 Allergy status to narcotic agent status: Secondary | ICD-10-CM | POA: Diagnosis not present

## 2020-09-01 DIAGNOSIS — G9341 Metabolic encephalopathy: Secondary | ICD-10-CM

## 2020-09-01 DIAGNOSIS — Z20822 Contact with and (suspected) exposure to covid-19: Secondary | ICD-10-CM | POA: Diagnosis present

## 2020-09-01 DIAGNOSIS — I4821 Permanent atrial fibrillation: Secondary | ICD-10-CM

## 2020-09-01 DIAGNOSIS — Z8616 Personal history of COVID-19: Secondary | ICD-10-CM | POA: Diagnosis not present

## 2020-09-01 DIAGNOSIS — Z7983 Long term (current) use of bisphosphonates: Secondary | ICD-10-CM | POA: Diagnosis not present

## 2020-09-01 DIAGNOSIS — Z66 Do not resuscitate: Secondary | ICD-10-CM | POA: Diagnosis present

## 2020-09-01 DIAGNOSIS — Z882 Allergy status to sulfonamides status: Secondary | ICD-10-CM | POA: Diagnosis not present

## 2020-09-01 DIAGNOSIS — J449 Chronic obstructive pulmonary disease, unspecified: Secondary | ICD-10-CM | POA: Diagnosis present

## 2020-09-01 DIAGNOSIS — Z87891 Personal history of nicotine dependence: Secondary | ICD-10-CM | POA: Diagnosis not present

## 2020-09-01 DIAGNOSIS — G309 Alzheimer's disease, unspecified: Secondary | ICD-10-CM

## 2020-09-01 DIAGNOSIS — E876 Hypokalemia: Secondary | ICD-10-CM

## 2020-09-01 DIAGNOSIS — I5032 Chronic diastolic (congestive) heart failure: Secondary | ICD-10-CM

## 2020-09-01 DIAGNOSIS — I2510 Atherosclerotic heart disease of native coronary artery without angina pectoris: Secondary | ICD-10-CM | POA: Diagnosis present

## 2020-09-01 DIAGNOSIS — Z91041 Radiographic dye allergy status: Secondary | ICD-10-CM | POA: Diagnosis not present

## 2020-09-01 DIAGNOSIS — R569 Unspecified convulsions: Secondary | ICD-10-CM | POA: Diagnosis present

## 2020-09-01 DIAGNOSIS — R531 Weakness: Secondary | ICD-10-CM

## 2020-09-01 DIAGNOSIS — Z7982 Long term (current) use of aspirin: Secondary | ICD-10-CM | POA: Diagnosis not present

## 2020-09-01 DIAGNOSIS — E039 Hypothyroidism, unspecified: Secondary | ICD-10-CM | POA: Diagnosis present

## 2020-09-01 DIAGNOSIS — Z888 Allergy status to other drugs, medicaments and biological substances status: Secondary | ICD-10-CM | POA: Diagnosis not present

## 2020-09-01 LAB — PROTIME-INR
INR: 1.3 — ABNORMAL HIGH (ref 0.8–1.2)
Prothrombin Time: 15.4 seconds — ABNORMAL HIGH (ref 11.4–15.2)

## 2020-09-01 LAB — COMPREHENSIVE METABOLIC PANEL
ALT: 14 U/L (ref 0–44)
AST: 20 U/L (ref 15–41)
Albumin: 3.8 g/dL (ref 3.5–5.0)
Alkaline Phosphatase: 63 U/L (ref 38–126)
Anion gap: 12 (ref 5–15)
BUN: 20 mg/dL (ref 8–23)
CO2: 30 mmol/L (ref 22–32)
Calcium: 10.5 mg/dL — ABNORMAL HIGH (ref 8.9–10.3)
Chloride: 103 mmol/L (ref 98–111)
Creatinine, Ser: 0.9 mg/dL (ref 0.44–1.00)
GFR, Estimated: 60 mL/min — ABNORMAL LOW (ref >60)
Glucose, Bld: 99 mg/dL (ref 70–99)
Potassium: 3.5 mmol/L (ref 3.5–5.1)
Sodium: 145 mmol/L (ref 135–145)
Total Bilirubin: 0.4 mg/dL (ref 0.3–1.2)
Total Protein: 6.3 g/dL — ABNORMAL LOW (ref 6.5–8.1)

## 2020-09-01 LAB — URINALYSIS, ROUTINE W REFLEX MICROSCOPIC
Bilirubin Urine: NEGATIVE
Glucose, UA: NEGATIVE mg/dL
Hgb urine dipstick: NEGATIVE
Ketones, ur: NEGATIVE mg/dL
Leukocytes,Ua: NEGATIVE
Nitrite: NEGATIVE
Protein, ur: NEGATIVE mg/dL
Specific Gravity, Urine: 1.013 (ref 1.005–1.030)
pH: 6 (ref 5.0–8.0)

## 2020-09-01 LAB — CBC
HCT: 39 % (ref 36.0–46.0)
Hemoglobin: 12.4 g/dL (ref 12.0–15.0)
MCH: 30.2 pg (ref 26.0–34.0)
MCHC: 31.8 g/dL (ref 30.0–36.0)
MCV: 95.1 fL (ref 80.0–100.0)
Platelets: 154 10*3/uL (ref 150–400)
RBC: 4.1 MIL/uL (ref 3.87–5.11)
RDW: 14.3 % (ref 11.5–15.5)
WBC: 4.9 10*3/uL (ref 4.0–10.5)
nRBC: 0 % (ref 0.0–0.2)

## 2020-09-01 LAB — TSH: TSH: 5.745 u[IU]/mL — ABNORMAL HIGH (ref 0.350–4.500)

## 2020-09-01 LAB — RESP PANEL BY RT-PCR (FLU A&B, COVID) ARPGX2
Influenza A by PCR: NEGATIVE
Influenza B by PCR: NEGATIVE
SARS Coronavirus 2 by RT PCR: NEGATIVE

## 2020-09-01 LAB — VITAMIN B12: Vitamin B-12: 456 pg/mL (ref 180–914)

## 2020-09-01 LAB — MAGNESIUM
Magnesium: 1.8 mg/dL (ref 1.7–2.4)
Magnesium: 1.8 mg/dL (ref 1.7–2.4)

## 2020-09-01 LAB — APTT: aPTT: 31 seconds (ref 24–36)

## 2020-09-01 LAB — TROPONIN I (HIGH SENSITIVITY): Troponin I (High Sensitivity): 14 ng/L (ref <18)

## 2020-09-01 LAB — PHOSPHORUS: Phosphorus: 3.5 mg/dL (ref 2.5–4.6)

## 2020-09-01 LAB — MRSA PCR SCREENING: MRSA by PCR: POSITIVE — AB

## 2020-09-01 LAB — FOLATE: Folate: 22.9 ng/mL (ref >5.9)

## 2020-09-01 LAB — VITAMIN D 25 HYDROXY (VIT D DEFICIENCY, FRACTURES): Vit D, 25-Hydroxy: 39.13 ng/mL (ref 30–100)

## 2020-09-01 LAB — AMMONIA: Ammonia: 13 umol/L (ref 9–35)

## 2020-09-01 LAB — T4, FREE: Free T4: 1.05 ng/dL (ref 0.61–1.12)

## 2020-09-01 MED ORDER — UMECLIDINIUM BROMIDE 62.5 MCG/INH IN AEPB
1.0000 | INHALATION_SPRAY | Freq: Every day | RESPIRATORY_TRACT | Status: DC
  Administered 2020-09-01: 1 via RESPIRATORY_TRACT
  Filled 2020-09-01 (×2): qty 7

## 2020-09-01 MED ORDER — MONTELUKAST SODIUM 10 MG PO TABS
10.0000 mg | ORAL_TABLET | Freq: Every day | ORAL | Status: DC
  Administered 2020-09-01 – 2020-09-02 (×2): 10 mg via ORAL
  Filled 2020-09-01 (×2): qty 1

## 2020-09-01 MED ORDER — POTASSIUM CHLORIDE 10 MEQ/100ML IV SOLN
10.0000 meq | INTRAVENOUS | Status: AC
  Administered 2020-09-01 (×3): 10 meq via INTRAVENOUS
  Filled 2020-09-01 (×3): qty 100

## 2020-09-01 MED ORDER — FLUTICASONE-UMECLIDIN-VILANT 100-62.5-25 MCG/INH IN AEPB: 1.0000 | INHALATION_SPRAY | Freq: Every day | RESPIRATORY_TRACT | Status: DC

## 2020-09-01 MED ORDER — ENOXAPARIN SODIUM 40 MG/0.4ML ~~LOC~~ SOLN
40.0000 mg | SUBCUTANEOUS | Status: DC
  Administered 2020-09-01 – 2020-09-02 (×2): 40 mg via SUBCUTANEOUS
  Filled 2020-09-01 (×2): qty 0.4

## 2020-09-01 MED ORDER — CHLORHEXIDINE GLUCONATE CLOTH 2 % EX PADS
6.0000 | MEDICATED_PAD | Freq: Every day | CUTANEOUS | Status: DC
  Administered 2020-09-01 – 2020-09-02 (×2): 6 via TOPICAL

## 2020-09-01 MED ORDER — TOPIRAMATE 25 MG PO TABS: 25.0000 mg | ORAL_TABLET | Freq: Every day | ORAL | Status: DC | PRN

## 2020-09-01 MED ORDER — SODIUM CHLORIDE 0.9 % IV SOLN: Freq: Once | INTRAVENOUS | Status: AC

## 2020-09-01 MED ORDER — DILTIAZEM HCL ER COATED BEADS 120 MG PO CP24
120.0000 mg | ORAL_CAPSULE | Freq: Every day | ORAL | Status: DC
  Administered 2020-09-01 – 2020-09-02 (×2): 120 mg via ORAL
  Filled 2020-09-01 (×2): qty 1

## 2020-09-01 MED ORDER — MOMETASONE FURO-FORMOTEROL FUM 100-5 MCG/ACT IN AERO
2.0000 | INHALATION_SPRAY | Freq: Two times a day (BID) | RESPIRATORY_TRACT | Status: DC
  Administered 2020-09-01 – 2020-09-02 (×2): 2 via RESPIRATORY_TRACT
  Filled 2020-09-01 (×2): qty 8.8

## 2020-09-01 MED ORDER — POTASSIUM CHLORIDE IN NACL 20-0.9 MEQ/L-% IV SOLN: INTRAVENOUS | Status: DC

## 2020-09-01 MED ORDER — ISOSORBIDE MONONITRATE ER 30 MG PO TB24
15.0000 mg | ORAL_TABLET | Freq: Every day | ORAL | Status: DC
  Administered 2020-09-01 – 2020-09-02 (×2): 15 mg via ORAL
  Filled 2020-09-01 (×2): qty 1

## 2020-09-01 MED ORDER — MELATONIN 3 MG PO TABS
6.0000 mg | ORAL_TABLET | Freq: Every day | ORAL | Status: DC
  Administered 2020-09-01: 6 mg via ORAL
  Filled 2020-09-01 (×2): qty 2

## 2020-09-01 MED ORDER — DIGOXIN 125 MCG PO TABS
0.1250 mg | ORAL_TABLET | Freq: Every evening | ORAL | Status: DC
  Administered 2020-09-01: 0.125 mg via ORAL
  Filled 2020-09-01: qty 1

## 2020-09-01 MED ORDER — MUPIROCIN 2 % EX OINT
1.0000 "application " | TOPICAL_OINTMENT | Freq: Two times a day (BID) | CUTANEOUS | Status: DC
  Administered 2020-09-01 – 2020-09-02 (×3): 1 via NASAL
  Filled 2020-09-01 (×2): qty 22

## 2020-09-01 MED ORDER — LEVETIRACETAM 250 MG PO TABS: 125.0000 mg | ORAL_TABLET | ORAL | Status: DC

## 2020-09-01 MED ORDER — PANTOPRAZOLE SODIUM 40 MG PO TBEC
40.0000 mg | DELAYED_RELEASE_TABLET | Freq: Every day | ORAL | Status: DC
  Administered 2020-09-01 – 2020-09-02 (×2): 40 mg via ORAL
  Filled 2020-09-01 (×2): qty 1

## 2020-09-01 MED ORDER — LEVETIRACETAM 250 MG PO TABS
250.0000 mg | ORAL_TABLET | Freq: Every day | ORAL | Status: DC
  Administered 2020-09-01 – 2020-09-02 (×2): 250 mg via ORAL
  Filled 2020-09-01 (×2): qty 1

## 2020-09-01 MED ORDER — DILTIAZEM HCL ER BEADS 120 MG PO CP24
120.0000 mg | ORAL_CAPSULE | Freq: Every day | ORAL | Status: DC
  Filled 2020-09-01 (×2): qty 1

## 2020-09-01 MED ORDER — LEVETIRACETAM 250 MG PO TABS
125.0000 mg | ORAL_TABLET | Freq: Every day | ORAL | Status: DC
  Administered 2020-09-01 – 2020-09-02 (×2): 125 mg via ORAL
  Filled 2020-09-01 (×2): qty 1

## 2020-09-01 NOTE — TOC Initial Note (Signed)
Transition of Care [facility]) - Initial/Assessment Note    Patient Details  Name: Deborah [name] MRN: [id number] Date of Birth: March 01, 1926  Transition of Care [facility]) CM/SW Contact:    Karn Cassis, LCSW Phone Number: 09/01/2020, 10:53 AM  Clinical Narrative:  Pt admitted due to acute metabolic encephalopathy and hypokalemia. Pt has been a resident at [facility] for several years. Per Marcelino Duster at [facility], she is on the memory care unit. Pt ambulates with a walker and requires assistance with bathing and dressing. No current home health services. Okay to return at d/c. LCSW also spoke with pt's daughter, Darl Pikes who confirms plan to return to [facility] when medically stable. TOC will continue to follow.                 Expected Discharge Plan: Assisted Living Barriers to Discharge: Continued Medical Work up   Patient Goals and CMS Choice Patient states their goals for this hospitalization and ongoing recovery are:: return to ALF   Choice offered to / list presented to : Adult Children  Expected Discharge Plan and Services Expected Discharge Plan: Assisted Living In-house Referral: Clinical Social Work   Post Acute Care Choice: Resumption of Svcs/PTA Provider Living arrangements for the past 2 months: Assisted Living Facility                 DME Arranged: N/A DME Agency: NA                  Prior Living Arrangements/Services Living arrangements for the past 2 months: Assisted Living Facility Lives with:: Facility Resident Patient language and need for interpreter reviewed:: Yes Do you feel safe going back to the place where you live?: Yes      Need for Family Participation in Patient Care: Yes (Comment) Care giver support system in place?: Yes (comment) Current home services: DME (walker) Criminal Activity/Legal Involvement Pertinent to Current Situation/Hospitalization: No - Comment as needed  Activities of Daily Living Home Assistive  Devices/Equipment: None ADL Screening (condition at time of admission) Patient's cognitive ability adequate to safely complete daily activities?: No Is the patient deaf or have difficulty hearing?: Yes Does the patient have difficulty seeing, even when wearing glasses/contacts?: No Does the patient have difficulty concentrating, remembering, or making decisions?: Yes Patient able to express need for assistance with ADLs?: No Does the patient have difficulty dressing or bathing?: Yes Independently performs ADLs?: No Communication: Needs assistance Is this a change from baseline?: Pre-admission baseline Grooming: Dependent Is this a change from baseline?: Pre-admission baseline Feeding: Dependent Is this a change from baseline?: Pre-admission baseline Bathing: Dependent Is this a change from baseline?: Pre-admission baseline Toileting: Dependent Is this a change from baseline?: Pre-admission baseline In/Out Bed: Dependent Is this a change from baseline?: Pre-admission baseline Walks in Home: Dependent Is this a change from baseline?: Pre-admission baseline Does the patient have difficulty walking or climbing stairs?: Yes Weakness of Legs: Both Weakness of Arms/Hands: Both  Permission Sought/Granted         Permission granted to share info w AGENCY: [facility]  Permission granted to share info w Relationship: ALF     Emotional Assessment   Attitude/Demeanor/Rapport: Unable to Assess Affect (typically observed): Unable to Assess   Alcohol / Substance Use: Not Applicable Psych Involvement: No (comment)  Admission diagnosis:  Acute encephalopathy [G93.40] Acute metabolic encephalopathy [G93.41] Patient Active Problem List   Diagnosis Date Noted  . Acute metabolic encephalopathy 09/01/2020  . Generalized weakness 09/01/2020  . Hypercalcemia 09/01/2020  .  Dehydration 09/01/2020  . Essential hypertension 09/01/2020  . Acute pulmonary embolism without acute cor pulmonale  (HCC) 07/20/2019  . Sepsis due to undetermined organism (HCC) 07/18/2019  . AKI (acute kidney injury) (HCC) 07/18/2019  . Acute cystitis without hematuria   . Sepsis (HCC) 07/17/2019  . COVID-19 virus infection 07/17/2019  . Chest pain 09/02/2017  . Compression fracture of body of thoracic vertebra (HCC) 06/22/2017  . Atrial fibrillation with rapid ventricular response (HCC)   . Screening for colorectal cancer 03/24/2017  . Hemorrhoids 03/24/2017  . Itching in the vaginal area 03/24/2017  . Vulval thrush 03/24/2017  . Hypoxemia   . Alzheimer disease (HCC) 09/13/2016  . Seizures (HCC) 09/13/2016  . Hypokalemia 09/13/2016  . COPD exacerbation (HCC) 09/13/2016  . Hypotension 04/22/2012  . CAD S/P percutaneous coronary angioplasty   . COPD (chronic obstructive pulmonary disease) (HCC)   . Mixed hyperlipidemia 05/14/2009  . Permanent atrial fibrillation (HCC) 02/10/2009  . Chronic diastolic CHF (congestive heart failure) (HCC) 02/10/2009   PCP:  Marlowe Aschoff, FNP Pharmacy:   [facility], [place] - [address] [facility] [place]. [address] [place] [address] Phone: [phone] Fax: [phone]     Social Determinants of Health (SDOH) Interventions    Readmission Risk Interventions No flowsheet data found.

## 2020-09-01 NOTE — Plan of Care (Signed)

## 2020-09-01 NOTE — Progress Notes (Addendum)
PROGRESS NOTE  Deborah [name] YBO:[id number] DOB: 12/12/1925 DOA: 08/31/2020 PCP: Marlowe Aschoff, FNP  Brief History:  87 year old female with a history of Alzheimer's dementia, coronary disease, permanent atrial fibrillation, diastolic CHF, hypertension, hyperlipidemia, seizure order presenting with confusion and generalized weakness.  The patient is a poor historian secondary to her Alzheimer's dementia.  History is from review of the medical record and speaking with the patient's daughter at the bedside.  Apparently, the patient was noted to be more confused on 08/29/2020.  She has had decreased oral intake.  Apparently there was some improvement on 08/30/2020, but she became more confused again on 08/31/2020.  The staff at [facility] noted the patient was sitting in a corner trying to defecate.  The patient was brought to emergency department for further evaluation.  The patient's daughter is not aware of any new medications, but she is usually not notified of any changes.  There is been no reports of fevers, chills, chest pain, shortness breath, vomiting, diarrhea.  At baseline, the patient is pleasantly confused and independent with her ADLs including dressing, bathing, and going to the bathroom. In the emergency department, the patient was afebrile hemodynamically stable with oxygen saturation 97% on room air.  BMP showed sodium 141, potassium 3.1, serum creatinine 1.06.  Serum calcium was 11.6 with albumin 4.2.  WBC 5.0, hemoglobin 13.0, platelets 192,000. the patient was started on IV fluids with improvement of her mental status.  Assessment/Plan: Acute metabolic encephalopathy -Primarily driven by the patient's hypercalcemia -Presented with corrected calcium 11.6 -Improving with IV fluids -UA negative for pyuria -Serum B12--456 -Folate 22.9 -Ammonia 13 -CT brain neg -personally reviewed EKG--afib, nonspecific T wave change  Hypercalcemia -This may have been uncovered with the  recent assumed discontinuation of Fosamax--review of the medical record shows that the patient was previously on Fosamax, but is not currently on her active medication list -Calcium was normal one year ago -Other considerations include hyper vitaminosis D vs hyperparathyroidism -d/c 25-vitamin D and calcitriol -check intact PTH -continue IVF today -repeat Ca in am -check 25 vitamin D and 1,25 vitamin D -TSH 5.745  Permanent atrial fibrillation -Continue digoxin, diltiazem -continue apixaban -Rate controlled  Chronic diastolic CHF -Clinically euvolemic  Essential hypertension -Continue diltiazem -Continue Imdur  Alzheimer's dementia -At risk for hospital delirium -Continue home dose sertraline  Hypokalemia -replete -mag 1.8              Status is: Inpatient  Remains inpatient appropriate because:IV treatments appropriate due to intensity of illness or inability to take PO   Dispo: The patient is from: Home              Anticipated d/c is to: ALF              Patient currently is not medically stable to d/c.   Difficult to place patient Yes        Family Communication:   Daughter updated at bedside 4/12  Consultants:  none  Code Status:   DNR  DVT Prophylaxis: apixaban   Procedures: As Listed in Progress Note Above  Antibiotics: None   Total time spent 35 minutes.  Greater than 50% spent face to face counseling and coordinating care.     Subjective: Patient denies fevers, chills, headache, chest pain, dyspnea, nausea, vomiting, diarrhea, abdominal pain, dysuria, hematuria,    Objective: Vitals:   09/01/20 0751 09/01/20 0815 09/01/20 0854 09/01/20 0900  BP:  (!) 175/84  Marland Kitchen)  169/81  Pulse:      Resp:  (!) 24  (!) 30  Temp: 97.8 F (36.6 C)     TempSrc: Oral     SpO2:   96%   Weight:      Height:        Intake/Output Summary (Last 24 hours) at 09/01/2020 1017 Last data filed at 09/01/2020 0741 Gross per 24 hour  Intake  1875.04 ml  Output --  Net 1875.04 ml   Weight change:  Exam:   General:  Pt is alert, follows commands appropriately, not in acute distress  HEENT: No icterus, No thrush, No neck mass, [place]/AT  Cardiovascular: RRR, S1/S2, no rubs, no gallops  Respiratory: CTA bilaterally, no wheezing, no crackles, no rhonchi  Abdomen: Soft/+BS, non tender, non distended, no guarding  Extremities: No edema, No lymphangitis, No petechiae, No rashes, no synovitis   Data Reviewed: I have personally reviewed following labs and imaging studies Basic Metabolic Panel: Recent Labs  Lab 08/31/20 2150 09/01/20 0656 09/01/20 0825  NA 141 145  --   K 3.1* 3.5  --   CL 100 103  --   CO2 29 30  --   GLUCOSE 105* 99  --   BUN 25* 20  --   CREATININE 1.06* 0.90  --   CALCIUM 11.6* 10.5*  --   MG  --  1.8 1.8  PHOS  --  3.5  --    Liver Function Tests: Recent Labs  Lab 08/31/20 2150 09/01/20 0656  AST 21 20  ALT 14 14  ALKPHOS 69 63  BILITOT 0.4 0.4  PROT 7.0 6.3*  ALBUMIN 4.2 3.8   No results for input(s): LIPASE, AMYLASE in the last 168 hours. Recent Labs  Lab 09/01/20 0825  AMMONIA 13   Coagulation Profile: Recent Labs  Lab 08/31/20 2150 09/01/20 0656  INR 1.5* 1.3*   CBC: Recent Labs  Lab 08/31/20 2150 09/01/20 0656  WBC 5.0 4.9  NEUTROABS 3.6  --   HGB 13.0 12.4  HCT 40.4 39.0  MCV 94.2 95.1  PLT 192 154   Cardiac Enzymes: No results for input(s): CKTOTAL, CKMB, CKMBINDEX, TROPONINI in the last 168 hours. BNP: Invalid input(s): POCBNP CBG: No results for input(s): GLUCAP in the last 168 hours. HbA1C: No results for input(s): HGBA1C in the last 72 hours. Urine analysis:    Component Value Date/Time   COLORURINE YELLOW 09/01/2020 0057   APPEARANCEUR HAZY (A) 09/01/2020 0057   LABSPEC 1.013 09/01/2020 0057   PHURINE 6.0 09/01/2020 0057   GLUCOSEU NEGATIVE 09/01/2020 0057   HGBUR NEGATIVE 09/01/2020 0057   BILIRUBINUR NEGATIVE 09/01/2020 0057   KETONESUR  NEGATIVE 09/01/2020 0057   PROTEINUR NEGATIVE 09/01/2020 0057   UROBILINOGEN 0.2 04/28/2012 1830   NITRITE NEGATIVE 09/01/2020 0057   LEUKOCYTESUR NEGATIVE 09/01/2020 0057   Sepsis Labs: @LABRCNTIP (procalcitonin:4,lacticidven:4) ) Recent Results (from the past 240 hour(s))  Blood Culture (routine x 2)     Status: None (Preliminary result)   Collection Time: 08/31/20  9:50 PM   Specimen: BLOOD RIGHT FOREARM  Result Value Ref Range Status   Specimen Description BLOOD RIGHT FOREARM  Final   Special Requests   Final    BOTTLES DRAWN AEROBIC AND ANAEROBIC Blood Culture adequate volume   Culture   Final    NO GROWTH < 12 HOURS Performed at [facility], [address] [place], [place]    Report Status PENDING  Incomplete  Blood Culture (routine x 2)  Status: None (Preliminary result)   Collection Time: 08/31/20 11:08 PM   Specimen: BLOOD  Result Value Ref Range Status   Specimen Description BLOOD RIGHT ANTECUBITAL  Final   Special Requests   Final    BOTTLES DRAWN AEROBIC AND ANAEROBIC Blood Culture adequate volume   Culture   Final    NO GROWTH < 12 HOURS Performed at [facility], [address], [place], [place] [address]    Report Status PENDING  Incomplete  Resp Panel by RT-PCR (Flu A&B, Covid) Nasopharyngeal Swab     Status: None   Collection Time: 09/01/20 12:50 AM   Specimen: Nasopharyngeal Swab; Nasopharyngeal(NP) swabs in vial transport medium  Result Value Ref Range Status   SARS Coronavirus 2 by RT PCR NEGATIVE NEGATIVE Final    Comment: (NOTE) SARS-CoV-2 target nucleic acids are NOT DETECTED.  The SARS-CoV-2 RNA is generally detectable in upper respiratory specimens during the acute phase of infection. The lowest concentration of SARS-CoV-2 viral copies this assay can detect is 138 copies/mL. A negative result does not preclude SARS-Cov-2 infection and should not be used as the sole basis for treatment or other patient management decisions. A negative  result may occur with  improper specimen collection/handling, submission of specimen other than nasopharyngeal swab, presence of viral mutation(s) within the areas targeted by this assay, and inadequate number of viral copies(<138 copies/mL). A negative result must be combined with clinical observations, patient history, and epidemiological information. The expected result is Negative.  Fact Sheet for Patients:  BloggerCourse.com  Fact Sheet for Healthcare Providers:  SeriousBroker.it  This test is no t yet approved or cleared by the [place] FDA and  has been authorized for detection and/or diagnosis of SARS-CoV-2 by FDA under an Emergency Use Authorization (EUA). This EUA will remain  in effect (meaning this test can be used) for the duration of the COVID-19 declaration under Section 564(b)(1) of the Act, 21 U.S.C.section 360bbb-3(b)(1), unless the authorization is terminated  or revoked sooner.       Influenza A by PCR NEGATIVE NEGATIVE Final   Influenza B by PCR NEGATIVE NEGATIVE Final    Comment: (NOTE) The Xpert Xpress SARS-CoV-2/FLU/RSV plus assay is intended as an aid in the diagnosis of influenza from Nasopharyngeal swab specimens and should not be used as a sole basis for treatment. Nasal washings and aspirates are unacceptable for Xpert Xpress SARS-CoV-2/FLU/RSV testing.  Fact Sheet for Patients: BloggerCourse.com  Fact Sheet for Healthcare Providers: SeriousBroker.it  This test is not yet approved or cleared by the [place] FDA and has been authorized for detection and/or diagnosis of SARS-CoV-2 by FDA under an Emergency Use Authorization (EUA). This EUA will remain in effect (meaning this test can be used) for the duration of the COVID-19 declaration under Section 564(b)(1) of the Act, 21 U.S.C. section 360bbb-3(b)(1), unless the authorization is  terminated or revoked.  Performed at [facility], [address] [place], [place] [address]   MRSA PCR Screening     Status: Abnormal   Collection Time: 09/01/20  4:06 AM   Specimen: Nasopharyngeal  Result Value Ref Range Status   MRSA by PCR POSITIVE (A) NEGATIVE Final    Comment:        The GeneXpert MRSA Assay (FDA approved for NASAL specimens only), is one component of a comprehensive MRSA colonization surveillance program. It is not intended to diagnose MRSA infection nor to guide or monitor treatment for MRSA infections. RESULT CALLED TO, READ BACK BY AND VERIFIED WITH:  ASHLEY Nerio 4/12 @ 0757 BY Chauncey MannS. BEARD Performed at [facility], [address] [place], [place] [address]      Scheduled Meds: . Chlorhexidine Gluconate Cloth  6 each Topical Q0600  . digoxin  0.125 mg Oral QPM  . diltiazem  120 mg Oral Daily  . enoxaparin (LOVENOX) injection  40 mg Subcutaneous Q24H  . isosorbide mononitrate  15 mg Oral Daily  . levETIRAcetam  250 mg Oral Daily   And  . levETIRAcetam  125 mg Oral Q1400  . melatonin  6 mg Oral QHS  . mometasone-formoterol  2 puff Inhalation BID   And  . umeclidinium bromide  1 puff Inhalation Daily  . montelukast  10 mg Oral Daily  . mupirocin ointment  1 application Nasal BID  . pantoprazole  40 mg Oral Daily   Continuous Infusions: . cefTRIAXone (ROCEPHIN)  IV Stopped (08/31/20 2358)  . lactated ringers 150 mL/hr at 09/01/20 0741    Procedures/Studies: CT Head Wo Contrast  Result Date: 08/31/2020 CLINICAL DATA:  Altered mental status, hypotension EXAM: CT HEAD WITHOUT CONTRAST TECHNIQUE: Contiguous axial images were obtained from the base of the skull through the vertex without intravenous contrast. COMPARISON:  07/10/2020 FINDINGS: Brain: Normal anatomic configuration. Moderate parenchymal volume loss is commensurate with the patient's age. Moderate subcortical and periventricular white matter changes are present likely reflecting the  sequela of small vessel ischemia. Remote pontine lacunar infarct noted. No abnormal intra or extra-axial mass lesion or fluid collection. No abnormal mass effect or midline shift. No evidence of acute intracranial hemorrhage or infarct. Mild ventriculomegaly is commensurate with the degree of parenchymal volume loss and likely reflects the sequela of central atrophy. Cerebellum unremarkable. Vascular: No asymmetric hyperdense vasculature at the skull base. Skull: Intact Sinuses/Orbits: Paranasal sinuses are clear. Orbits are unremarkable. Other: Mastoid air cells and middle ear cavities are clear. IMPRESSION: No acute intracranial abnormality. Advanced senescent changes, stable since prior examination. Remote right pontine infarct. Electronically Signed   By: Helyn NumbersAshesh  Parikh MD   On: 08/31/2020 22:19   DG Chest Port 1 View  Result Date: 08/31/2020 CLINICAL DATA:  Confusion.  Possible sepsis. EXAM: PORTABLE CHEST 1 VIEW COMPARISON:  Chest x-ray dated July 10, 2020. FINDINGS: Unchanged cardiomegaly. Normal pulmonary vascularity. Unchanged biapical pleuroparenchymal scarring medially. No focal consolidation, pleural effusion, or pneumothorax. No acute osseous abnormality. IMPRESSION: 1. No active disease. Electronically Signed   By: Obie DredgeWilliam T Derry M.D.   On: 08/31/2020 21:40    Deborah Hartshornavid Yuvraj Pfeifer, DO  [facility]  If 7PM-7AM, please contact night-coverage [email or web address] Password [email or web address] 09/01/2020, 10:17 AM   LOS: 0 days

## 2020-09-01 NOTE — H&P (Signed)
History and Physical  Deborah [name]:[id number] DOB: October 30, 1925 DOA: 08/31/2020  Referring physician: Derwood Kaplan, MD PCP: Marlowe Aschoff, FNP  Patient coming from: SNF  Chief Complaint: Altered mental status  HPI: Deborah [name] is a 87 y.o. female with medical history significant for Alzheimer's dementia, coronary artery disease, permanent atrial fibrillation, diastolic CHF, hypertension, hyperlipidemia and seizure order who presents to the emergency department via EMS due to altered mental status.  Patient was reported to have had gradual increased confusion within the last month, this became more profound in the last 2 weeks, and today, she was found with her pants down in a corner defecating which was not normal for patient.  She was also reported to present with weakness and has been sleeping more than usual.  Patient normally is able to use walker without assistance and she was very functional at baseline. She was admitted from 2/24-2/28 due to sepsis secondary to UTI and COVID-19 disease/gastroenteritis.  ED Course:  In the emergency department, temperature was 97.67F (axillary), she was bradycardic, BP was 115/62, O2 sat was 96% on room air.  Work-up in the ED showed normal CBC, hypokalemia, hypercalcemia.  Influenza A, B and SARS coronavirus 2 was negative.  Urinalysis was unimpressive for UTI. CT of head without contrast showed no acute intracranial normality.  Chest x-ray showed no active disease. She was empirically treated with UTI.  IV hydration was provided.  Hospitalist was asked to admit patient for further evaluation and management.  Review of Systems: This cannot be obtained at this time due to patient being somnolent, though easily arousable, but quickly goes back to sleep   Past Medical History:  Diagnosis Date  . Alzheimer disease (HCC)   . Asthma   . Atrial fibrillation (HCC)    Not anticoagulated with history of bleeding and fall risk  . Chronic diastolic  heart failure (HCC)   . COPD (chronic obstructive pulmonary disease) (HCC)   . Coronary atherosclerosis of native coronary artery    BMS circumflex 2009  . Dementia without behavioral disturbance (HCC)   . Drug intolerance    Flecacinide and Amiodarone   . Fracture of multiple pubic rami (HCC) 12/13   Left superior and inferior - following fall  . Hypotension   . Mixed hyperlipidemia   . Osteoarthritis   . Retroperitoneal hemorrhage 12/13   Following fall  . Seizures (HCC)    Remote history of over 20 years ago  . Umbilical hernia    Past Surgical History:  Procedure Laterality Date  . CATARACT EXTRACTION    . Radiofrequency catheter ablation     Failed  . Right carpel tunnel release    . TONSILLECTOMY      Social History:  reports that she quit smoking about 30 years ago. Her smoking use included cigarettes. She has a 20.00 pack-year smoking history. She has never used smokeless tobacco. She reports that she does not drink alcohol and does not use drugs.   Allergies  Allergen Reactions  . Ivp Dye [Iodinated Diagnostic Agents] Shortness Of Breath    ??  . Omnipaque [Iohexol] Shortness Of Breath and Other (See Comments)    Short of breath with chest tightness after IV injection in CT, pt was fine when she left department but developed symptoms in parking lot and went to emergency department.  . Sulfa Antibiotics Shortness Of Breath  . Amiodarone   . Carbamazepine     REACTION: toxemia  . Dilaudid [Hydromorphone] Nausea And Vomiting  .  Tramadol Other (See Comments)    confusion    Family History  Problem Relation Age of Onset  . Coronary artery disease Mother   . Tuberculosis Paternal Grandfather   . Heart attack Maternal Grandmother     Prior to Admission medications   Medication Sig Start Date End Date Taking? Authorizing Provider  acetaminophen (TYLENOL) 500 MG tablet Take 1,000 mg by mouth every 6 (six) hours as needed for mild pain. For pain  Patient not  taking: Reported on 07/10/2020    [provider]  alendronate (FOSAMAX) 70 MG tablet Take 70 mg by mouth every Wednesday. Take with a full glass of water on an empty stomach.    [provider]  ALPRAZolam [name]) 0.25 MG tablet Take 1 tablet (0.25 mg total) by mouth every 8 (eight) hours as needed for anxiety. Patient not taking: No sig reported 07/21/19   [name], MD  amoxicillin-clavulanate (AUGMENTIN) 875-125 MG tablet Take 1 tablet by mouth 2 (two) times daily. X 4 days Patient not taking: No sig reported 07/21/19   Tat, Onalee Hua, MD  apixaban (ELIQUIS) 5 MG TABS tablet Take 2 tablets (10 mg total) by mouth 2 (two) times daily. Then 1 tab (5 mg) two times daily starting 07/27/19 Patient taking differently: Take 5 mg by mouth 2 (two) times daily. 07/27/19   [name], MD  aspirin (ASPIRIN EC) 81 MG EC tablet Take 1 tablet (81 mg total) by mouth daily. Swallow whole. 05/04/12   [name], MD  calcitRIOL (ROCALTROL) 0.5 MCG capsule Take 0.5 mcg by mouth daily. 06/18/20   [provider]  calcium citrate-vitamin D 500-400 MG-UNIT chewable tablet Chew 1 tablet by mouth 2 (two) times daily.    [provider]  digoxin (LANOXIN) 0.125 MG tablet Take 1 tablet (0.125 mg total) by mouth every evening. 09/21/16   [name], [name], MD  diltiazem (CARDIZEM) 30 MG tablet Take 1 tablet (30 mg total) by mouth every 6 (six) hours. Patient not taking: No sig reported 09/21/16   [name], [name], MD  diltiazem [facility]) 120 MG 24 hr capsule Take 120 mg by mouth daily. 06/18/20   [provider]  docusate sodium (COLACE) 100 MG capsule Take 100 mg by mouth at bedtime.    [provider]  famotidine (PEPCID) 20 MG tablet Take 20 mg by mouth daily. 06/18/20   [provider]  fluticasone (FLONASE) 50 MCG/ACT nasal spray Place 2 sprays into both nostrils daily.    [provider]  furosemide (LASIX) 20 MG tablet Take 10 mg by mouth 2  (two) times daily.     [provider]  gabapentin (NEURONTIN) 300 MG capsule Take 300 mg by mouth at bedtime.    [provider]  guaiFENesin (MUCINEX) 600 MG 12 hr tablet Take 2 tablets (1,200 mg total) by mouth 2 (two) times daily. Patient taking differently: Take 600 mg by mouth daily.  09/21/16   [name], [name], MD  isosorbide mononitrate (IMDUR) 30 MG 24 hr tablet Take 0.5 tablets (15 mg total) by mouth daily. 09/03/17   Erick Blinks, MD  levETIRAcetam (KEPPRA) 250 MG tablet Take 125-250 mg by mouth See admin instructions. Takes one tablet in the morning and half a tablet at noon    [provider]  levothyroxine (SYNTHROID, LEVOTHROID) 25 MCG tablet Take 25 mcg by mouth daily before breakfast.    [provider]  Melatonin 3 MG TABS Take 5 mg by  mouth at bedtime.     [provider]  midodrine (PROAMATINE) 2.5 MG tablet Take 1 tablet (2.5 mg total) by mouth 2 (two) times daily with a meal. The dosing can be titrated down to once daily when her systolic blood pressure is consistently in the 100s. 05/04/12   [name], MD  montelukast (SINGULAIR) 10 MG tablet Take 10 mg by mouth daily.  09/13/17   [provider]  Multiple Vitamins-Minerals (ICAPS) CAPS Take 1 capsule by mouth 2 (two) times daily.    [provider]  ondansetron (ZOFRAN) 4 MG tablet Take 4 mg by mouth every 6 (six) hours as needed for nausea or vomiting.    [provider]  pantoprazole (PROTONIX) 40 MG tablet Take 1 tablet (40 mg total) by mouth daily. 09/29/17   Abelino Derrick, PA-C  potassium chloride (K-DUR,KLOR-CON) 10 MEQ tablet Take 10 mEq by mouth every other day. 05/27/18   [provider]  potassium chloride (KLOR-CON) 10 MEQ tablet Take by mouth. 06/18/20   [provider]  sertraline (ZOLOFT) 25 MG tablet Take 25 mg by mouth daily.    [provider]  topiramate (TOPAMAX) 25 MG tablet Take 1 tablet by mouth  daily as needed (for headache).  12/17/17   [provider]  TRELEGY ELLIPTA 100-62.5-25 MCG/INH AEPB Inhale 1 puff into the lungs daily. 07/07/20   [provider]    Physical Exam: BP (!) 141/57   Pulse (!) 44   Temp (!) 97.5 F (36.4 C) (Axillary)   Resp 19   Ht 5\' 5"  (1.651 m)   Wt 67.6 kg   SpO2 97%   BMI 24.80 kg/m   . General: 87 y.o. year-old female somnolent but easily arousable and in no acute distress. 99 HEENT: Dry mucous membrane.  NCAT, EOMI . Neck: Supple, trachea medial . Cardiovascular: Regular rate and rhythm with no rubs or gallops.  No thyromegaly or JVD noted.  No lower extremity edema. 2/4 pulses in all 4 extremities. Marland Kitchen Respiratory: Clear to auscultation with no wheezes or rales. Good inspiratory effort. . Abdomen: Soft nontender nondistended with normal bowel sounds x4 quadrants. . Muskuloskeletal: No cyanosis, clubbing or edema noted bilaterally . Neuro: Moving all 4 extremities.  Normal sensation, no focal neurologic deficit. . Skin: No ulcerative lesions noted or rashes . Psychiatry:Mood is appropriate for condition and setting          Labs on Admission:  Basic Metabolic Panel: Recent Labs  Lab 08/31/20 2150  NA 141  K 3.1*  CL 100  CO2 29  GLUCOSE 105*  BUN 25*  CREATININE 1.06*  CALCIUM 11.6*   Liver Function Tests: Recent Labs  Lab 08/31/20 2150  AST 21  ALT 14  ALKPHOS 69  BILITOT 0.4  PROT 7.0  ALBUMIN 4.2   No results for input(s): LIPASE, AMYLASE in the last 168 hours. No results for input(s): AMMONIA in the last 168 hours. CBC: Recent Labs  Lab 08/31/20 2150  WBC 5.0  NEUTROABS 3.6  HGB 13.0  HCT 40.4  MCV 94.2  PLT 192   Cardiac Enzymes: No results for input(s): CKTOTAL, CKMB, CKMBINDEX, TROPONINI in the last 168 hours.  BNP (last 3 results) No results for input(s): BNP in the last 8760 hours.  ProBNP (last 3 results) No results for input(s): PROBNP in the last 8760 hours.  CBG: No results  for input(s): GLUCAP in the last 168 hours.  Radiological Exams on Admission: CT Head Wo  Contrast  Result Date: 08/31/2020 CLINICAL DATA:  Altered mental status, hypotension EXAM: CT HEAD WITHOUT CONTRAST TECHNIQUE: Contiguous axial images were obtained from the base of the skull through the vertex without intravenous contrast. COMPARISON:  07/10/2020 FINDINGS: Brain: Normal anatomic configuration. Moderate parenchymal volume loss is commensurate with the patient's age. Moderate subcortical and periventricular white matter changes are present likely reflecting the sequela of small vessel ischemia. Remote pontine lacunar infarct noted. No abnormal intra or extra-axial mass lesion or fluid collection. No abnormal mass effect or midline shift. No evidence of acute intracranial hemorrhage or infarct. Mild ventriculomegaly is commensurate with the degree of parenchymal volume loss and likely reflects the sequela of central atrophy. Cerebellum unremarkable. Vascular: No asymmetric hyperdense vasculature at the skull base. Skull: Intact Sinuses/Orbits: Paranasal sinuses are clear. Orbits are unremarkable. Other: Mastoid air cells and middle ear cavities are clear. IMPRESSION: No acute intracranial abnormality. Advanced senescent changes, stable since prior examination. Remote right pontine infarct. Electronically Signed   By: Helyn NumbersAshesh  Parikh MD   On: 08/31/2020 22:19   DG Chest Port 1 View  Result Date: 08/31/2020 CLINICAL DATA:  Confusion.  Possible sepsis. EXAM: PORTABLE CHEST 1 VIEW COMPARISON:  Chest x-ray dated July 10, 2020. FINDINGS: Unchanged cardiomegaly. Normal pulmonary vascularity. Unchanged biapical pleuroparenchymal scarring medially. No focal consolidation, pleural effusion, or pneumothorax. No acute osseous abnormality. IMPRESSION: 1. No active disease. Electronically Signed   By: Obie DredgeWilliam T Derry M.D.   On: 08/31/2020 21:40    EKG: I independently viewed the EKG done and my findings are as  followed: Atrial fibrillation with rate control  Assessment/Plan Present on Admission: . Acute metabolic encephalopathy . Hypokalemia . Permanent atrial fibrillation (HCC) . Alzheimer disease (HCC)  Principal Problem:   Acute metabolic encephalopathy Active Problems:   Permanent atrial fibrillation (HCC)   Chronic diastolic CHF (congestive heart failure) (HCC)   Alzheimer disease (HCC)   Seizures (HCC)   Hypokalemia   Generalized weakness   Hypercalcemia   Dehydration   Essential hypertension  Acute metabolic encephalopathy Because of patient's altered mental status was unknown, but it was presumed to be due to UTI despite clear urine.  Unfortunately patient was altered and cannot communicate symptoms.  She was reported to  Present with altered mental status when last she had UTI Patient was empirically started on IV ceftriaxone, we shall continue with same at this time pending urine culture Urine culture done in 08/2019 was positive for Aerococcus urinae with only 60,000 colonies Bedside swallow eval will be done prior to oral intake . Hypercalcemia Ca 11.6, continue IV hydration  Hypokalemia K+ 3.1, this will be replenished  Dehydration  Continue IV hydration  Essential hypertension Continue diltiazem  Seizures Continue Keppra and Topamax  Chronic diastolic CHF Continue total input/output, daily weights and fluid restriction IV Lasix held at this time due to dehydration Continue Cardiac diet   Permanent atrial fibrillation Continue diltiazem and digoxin Patient was not on anticoagulant  COPD Continue Trelegy Ellipta and Singulair  DVT prophylaxis: Lovenox  Code Status: Full code  Family Communication: None at bedside  Disposition Plan:  Patient is from:                        home Anticipated DC to:                   SNF or family members home Anticipated DC date:  2-3 days Anticipated DC barriers:          Inpatient treatment required  due to acute metabolic neuropathy with possible UTI POA   Consults called: None  Admission status: Inpatient    Frankey Shown MD [facility]  09/01/2020, 5:11 AM

## 2020-09-01 NOTE — Progress Notes (Signed)
MRSA pcr came back positive.  Mupirocin ordered

## 2020-09-01 NOTE — Progress Notes (Signed)
Passed bedside swallow eval

## 2020-09-02 LAB — PARATHYROID HORMONE, INTACT (NO CA): PTH: 17 pg/mL (ref 15–65)

## 2020-09-02 LAB — URINE CULTURE: Culture: NO GROWTH

## 2020-09-02 LAB — COMPREHENSIVE METABOLIC PANEL
ALT: 15 U/L (ref 0–44)
AST: 20 U/L (ref 15–41)
Albumin: 3.9 g/dL (ref 3.5–5.0)
Alkaline Phosphatase: 64 U/L (ref 38–126)
Anion gap: 10 (ref 5–15)
BUN: 14 mg/dL (ref 8–23)
CO2: 26 mmol/L (ref 22–32)
Calcium: 9.9 mg/dL (ref 8.9–10.3)
Chloride: 107 mmol/L (ref 98–111)
Creatinine, Ser: 0.8 mg/dL (ref 0.44–1.00)
GFR, Estimated: >60 mL/min (ref >60)
Glucose, Bld: 101 mg/dL — ABNORMAL HIGH (ref 70–99)
Potassium: 4.2 mmol/L (ref 3.5–5.1)
Sodium: 143 mmol/L (ref 135–145)
Total Bilirubin: 0.6 mg/dL (ref 0.3–1.2)
Total Protein: 6.4 g/dL — ABNORMAL LOW (ref 6.5–8.1)

## 2020-09-02 LAB — CALCIUM, IONIZED: Calcium, Ionized, Serum: 5.9 mg/dL — ABNORMAL HIGH (ref 4.5–5.6)

## 2020-09-02 LAB — CALCITRIOL (1,25 DI-OH VIT D): Vit D, 1,25-Dihydroxy: 30 pg/mL (ref 19.9–79.3)

## 2020-09-02 MED ORDER — FAMOTIDINE 20 MG PO TABS: 20.0000 mg | ORAL_TABLET | Freq: Every day | ORAL | Status: DC

## 2020-09-02 MED ORDER — TOPIRAMATE 25 MG PO TABS: 25.0000 mg | ORAL_TABLET | Freq: Every day | ORAL | Status: DC | PRN

## 2020-09-02 MED ORDER — APIXABAN 5 MG PO TABS: 5.0000 mg | ORAL_TABLET | Freq: Two times a day (BID) | ORAL | Status: DC

## 2020-09-02 NOTE — TOC Transition Note (Signed)
Transition of Care [facility] - CM/SW Discharge Note   Patient Details  Name: Deborah [name] MRN: [id number] Date of Birth: Sep 07, 1925  Transition of Care [facility]) CM/SW Contact:  Karn Cassis, LCSW Phone Number: 09/02/2020, 1:43 PM   Clinical Narrative:  Pt d/c today and will return to [facility]. LCSW discussed PT recommendations with Marcelino Duster at [facility] and they feel they can manage pt's care at ALF. Home health PT and RN referred to [facility] and will start tomorrow. Pt's daughter aware of d/c and requests [facility]. D/C summary and FL2 sent to facility.      Final next level of care: Assisted Living Barriers to Discharge: Barriers Resolved   Patient Goals and CMS Choice Patient states their goals for this hospitalization and ongoing recovery are:: return to ALF   Choice offered to / list presented to : Adult Children  Discharge Placement              Patient chooses bed at: Other - please specify in the comment section below: [facility]) Patient to be transferred to facility by: [facility] Name of family member notified: Darl Pikes- daughter Patient and family notified of of transfer: 09/02/20  Discharge Plan and Services In-house Referral: Clinical Social Work   Post Acute Care Choice: Resumption of Svcs/PTA Provider          DME Arranged: N/A DME Agency: NA       HH Arranged: RN,PT HH Agency: [facility] Date HH Agency Contacted: 09/02/20 Time HH Agency Contacted: 1342 Representative spoke with at [facility] Agency: Maralyn Sago  Social Determinants of Health (SDOH) Interventions     Readmission Risk Interventions No flowsheet data found.

## 2020-09-02 NOTE — Evaluation (Signed)
Physical Therapy Evaluation Patient Details Name: Deborah [name] MRN: [id number] DOB: 04-23-1926 Today's Date: 09/02/2020   History of Present Illness  Deborah [name] is a 87 y.o. female with medical history significant for Alzheimer's dementia, coronary artery disease, permanent atrial fibrillation, diastolic CHF, hypertension, hyperlipidemia and seizure order who presents to the emergency department via EMS due to altered mental status.  Patient was reported to have had gradual increased confusion within the last month, this became more profound in the last 2 weeks, and today, she was found with her pants down in a corner defecating which was not normal for patient.  She was also reported to present with weakness and has been sleeping more than usual.  Patient normally is able to use walker without assistance and she was very functional at baseline.  She was admitted from 2/24-2/28 due to sepsis secondary to UTI and COVID-19 disease/gastroenteritis.    Clinical Impression  Patient demonstrates good return for sitting up at bedside, unsteady on feet and at high risk for falls due to BLE weakness, able to ambulate in room with slow labored cadence with near loss of balance when making turns and limited mostly due to fatigue/generalized weakness.  Patient tolerated sitting up in chair after therapy with sitter in room.  Plan:  Patient to be discharged back to ALF today and discharged from physical therapy to care of nursing for ambulation daily as tolerated for length of stay.     Follow Up Recommendations Home health PT;Supervision for mobility/OOB;Supervision/Assistance - 24 hour    Equipment Recommendations  None recommended by PT    Recommendations for Other Services       Precautions / Restrictions Precautions Precautions: Fall Restrictions Weight Bearing Restrictions: No      Mobility  Bed Mobility Overal bed mobility: Needs Assistance Bed Mobility: Supine to Sit     Supine to  sit: Modified independent (Device/Increase time);Supervision     General bed mobility comments: slightly labored movement    Transfers Overall transfer level: Needs assistance Equipment used: Rolling walker (2 wheeled) Transfers: Sit to/from [facility] Sit to Stand: Min assist Stand pivot transfers: Min assist;Mod assist       General transfer comment: increased time, labored movement  Ambulation/Gait Ambulation/Gait assistance: Min assist;Mod assist Gait Distance (Feet): 20 Feet Assistive device: Rolling walker (2 wheeled) Gait Pattern/deviations: Decreased step length - right;Decreased step length - left;Decreased stride length Gait velocity: decreased   General Gait Details: slow labored unsteady cadence requiring increased time to make turns with frequent verbal/tactile cueing for safety due to poor standing balance  Stairs            Wheelchair Mobility    Modified Rankin (Stroke Patients Only)       Balance Overall balance assessment: Needs assistance Sitting-balance support: Feet supported;No upper extremity supported Sitting balance-Leahy Scale: Good Sitting balance - Comments: seated at EOB   Standing balance support: During functional activity;Bilateral upper extremity supported Standing balance-Leahy Scale: Poor Standing balance comment: fair/poor using RW                             Pertinent Vitals/Pain Pain Assessment: No/denies pain    Home Living Family/patient expects to be discharged to:: Assisted living               Home Equipment: Walker - 2 wheels;Cane - single point      Prior Function Level of Independence: Needs assistance  Gait / Transfers Assistance Needed: [place] ambulator using RW  ADL's / Homemaking Assistance Needed: assisted by ALF staff        Hand Dominance   Dominant Hand: Right    Extremity/Trunk Assessment   Upper Extremity Assessment Upper Extremity Assessment:  Overall WFL for tasks assessed    Lower Extremity Assessment Lower Extremity Assessment: Generalized weakness    Cervical / Trunk Assessment Cervical / Trunk Assessment: Normal  Communication      Cognition Arousal/Alertness: Awake/alert Behavior During Therapy: WFL for tasks assessed/performed Overall Cognitive Status: History of cognitive impairments - at baseline                                        General Comments      Exercises     Assessment/Plan    PT Assessment All further PT needs can be met in the next venue of care  PT Problem List Decreased strength;Decreased activity tolerance;Decreased balance;Decreased mobility       PT Treatment Interventions      PT Goals (Current goals can be found in the Care Plan section)  Acute Rehab PT Goals Patient Stated Goal: return home PT Goal Formulation: With patient Time For Goal Achievement: 09/02/20 Potential to Achieve Goals: Good    Frequency     Barriers to discharge        Co-evaluation               AM-PAC PT "6 Clicks" Mobility  Outcome Measure Help needed turning from your back to your side while in a flat bed without using bedrails?: None Help needed moving from lying on your back to sitting on the side of a flat bed without using bedrails?: None Help needed moving to and from a bed to a chair (including a wheelchair)?: A Little Help needed standing up from a chair using your arms (e.g., wheelchair or bedside chair)?: A Lot Help needed to walk in hospital room?: A Lot Help needed climbing 3-5 steps with a railing? : A Lot 6 Click Score: 17    End of Session   Activity Tolerance: Patient tolerated treatment well;Patient limited by fatigue Patient left: in chair;with call bell/phone within reach;with nursing/sitter in room;with chair alarm set Nurse Communication: Mobility status PT Visit Diagnosis: Unsteadiness on feet (R26.81);Other abnormalities of gait and mobility  (R26.89);Muscle weakness (generalized) (M62.81)    Time: 2863-8177 PT Time Calculation (min) (ACUTE ONLY): 26 min   Charges:   PT Evaluation $PT Eval Moderate Complexity: 1 Mod PT Treatments $Therapeutic Activity: 23-37 mins        2:17 PM, 09/02/20 Lonell Grandchild, MPT Physical Therapist with [facility] 336 [phone] office [phone] mobile phone

## 2020-09-02 NOTE — [place] FL2 (Signed)
[place] MEDICAID FL2 LEVEL OF CARE SCREENING TOOL     IDENTIFICATION  Patient Name: Deborah [name] Birthdate: 1926/05/05 Sex: female Admission Date (Current Location): 08/31/2020  [facility] and [place] Number:  [employer] and Address:  [facility],  [address], [place] [address]      Provider Number: [id number]  Attending Physician Name and Address:  [name], MD  Relative Name and Phone Number:       Current Level of Care: Hospital Recommended Level of Care: Assisted Living Facility Prior Approval Number:    Date Approved/Denied:   PASRR Number:    Discharge Plan: Other (Comment) (ALF)    Current Diagnoses: Patient Active Problem List   Diagnosis Date Noted  . Acute metabolic encephalopathy 09/01/2020  . Generalized weakness 09/01/2020  . Hypercalcemia 09/01/2020  . Dehydration 09/01/2020  . Essential hypertension 09/01/2020  . Acute pulmonary embolism without acute cor pulmonale (HCC) 07/20/2019  . Sepsis due to undetermined organism (HCC) 07/18/2019  . AKI (acute kidney injury) (HCC) 07/18/2019  . Acute cystitis without hematuria   . Sepsis (HCC) 07/17/2019  . COVID-19 virus infection 07/17/2019  . Chest pain 09/02/2017  . Compression fracture of body of thoracic vertebra (HCC) 06/22/2017  . Atrial fibrillation with rapid ventricular response (HCC)   . Screening for colorectal cancer 03/24/2017  . Hemorrhoids 03/24/2017  . Itching in the vaginal area 03/24/2017  . Vulval thrush 03/24/2017  . Hypoxemia   . Alzheimer disease (HCC) 09/13/2016  . Seizures (HCC) 09/13/2016  . Hypokalemia 09/13/2016  . COPD exacerbation (HCC) 09/13/2016  . Hypotension 04/22/2012  . CAD S/P percutaneous coronary angioplasty   . COPD (chronic obstructive pulmonary disease) (HCC)   . Mixed hyperlipidemia 05/14/2009  . Permanent atrial fibrillation (HCC) 02/10/2009  . Chronic diastolic CHF (congestive heart failure) (HCC) 02/10/2009     Orientation RESPIRATION BLADDER Height & Weight     Self  Normal Incontinent Weight: 149 lb 0.5 oz (67.6 kg) Height:  5\' 5"  (165.1 cm)  BEHAVIORAL SYMPTOMS/MOOD NEUROLOGICAL BOWEL NUTRITION STATUS    Convulsions/Seizures (remote history) Incontinent Diet (Heart healthy)  AMBULATORY STATUS COMMUNICATION OF NEEDS Skin   Limited Assist Verbally Normal                       Personal Care Assistance Level of Assistance  Bathing,Feeding,Dressing Bathing Assistance: Limited assistance Feeding assistance: Limited assistance Dressing Assistance: Limited assistance     Functional Limitations Info  Sight,Speech,Hearing Sight Info: Adequate Hearing Info: Adequate Speech Info: Adequate    SPECIAL CARE FACTORS FREQUENCY  PT (By licensed PT)     PT Frequency: home health              Contractures      Additional Factors Info  Code Status,Allergies,Psychotropic,Isolation Precautions Code Status Info: DNR Allergies Info: Ivp Dye (Iodinated Diagnostic Agents), Omnipaque (Iohexol), Sulfa Antibiotics, Amiodarone, Carbamazepine, Dilaudid (hydromorphone), Tramadol Psychotropic Info: Xanax, Zoloft   Isolation Precautions Info: MRSA PCR +     Current Medications (09/02/2020):  This is the current hospital active medication list Current Facility-Administered Medications  Medication Dose Route Frequency Provider Last Rate Last Admin  . 0.9 % NaCl with KCl 20 mEq/ L  infusion   Intravenous Continuous Tat, David, MD 100 mL/hr at 09/02/20 0544 Infusion Verify at 09/02/20 0544  . Chlorhexidine Gluconate Cloth 2 % PADS 6 each  6 each Topical Q0600 Tat, 09/04/20, MD   6 each at 09/02/20 0557  .  digoxin (LANOXIN) tablet 0.125 mg  0.125 mg Oral QPM [name], MD   0.125 mg at 09/01/20 1703  . diltiazem (CARDIZEM CD) 24 hr capsule 120 mg  120 mg Oral Daily Tat, David, MD   120 mg at 09/02/20 6967  . enoxaparin (LOVENOX) injection 40 mg  40 mg Subcutaneous Q24H Tat, Onalee Hua, MD   40 mg at  09/02/20 8938  . isosorbide mononitrate (IMDUR) 24 hr tablet 15 mg  15 mg Oral Daily Tat, David, MD   15 mg at 09/02/20 0810  . levETIRAcetam (KEPPRA) tablet 250 mg  250 mg Oral Daily Tat, David, MD   250 mg at 09/02/20 0809   And  . levETIRAcetam (KEPPRA) tablet 125 mg  125 mg Oral Q1400 [name], MD   125 mg at 09/01/20 1557  . melatonin tablet 6 mg  6 mg Oral Maura Crandall, MD   6 mg at 09/01/20 2142  . mometasone-formoterol (DULERA) 100-5 MCG/ACT inhaler 2 puff  2 puff Inhalation BID Tat, Onalee Hua, MD   2 puff at 09/02/20 0728   And  . umeclidinium bromide (INCRUSE ELLIPTA) 62.5 MCG/INH 1 puff  1 puff Inhalation Daily Tat, Onalee Hua, MD   1 puff at 09/01/20 0849  . montelukast (SINGULAIR) tablet 10 mg  10 mg Oral Daily Tat, David, MD   10 mg at 09/02/20 0809  . mupirocin ointment (BACTROBAN) 2 % 1 application  1 application Nasal BID [name], MD   1 application at 09/02/20 [phone]  . pantoprazole (PROTONIX) EC tablet 40 mg  40 mg Oral Daily Tat, David, MD   40 mg at 09/02/20 0809  . topiramate (TOPAMAX) tablet 25 mg  25 mg Oral Daily PRN Tat, Onalee Hua, MD         Discharge Medications: Medication List    STOP taking these medications   alendronate 70 MG tablet Commonly known as: FOSAMAX   amoxicillin-clavulanate 875-125 MG tablet Commonly known as: AUGMENTIN   calcitRIOL 0.5 MCG capsule Commonly known as: ROCALTROL   calcium citrate-vitamin D 500-400 MG-UNIT chewable tablet   diltiazem 30 MG tablet Commonly known as: CARDIZEM   guaiFENesin 600 MG 12 hr tablet Commonly known as: MUCINEX     TAKE these medications   ALPRAZolam 0.25 MG tablet Commonly known as: XANAX Take 1 tablet (0.25 mg total) by mouth every 8 (eight) hours as needed for anxiety.   apixaban 5 MG Tabs tablet Commonly known as: ELIQUIS Take 1 tablet (5 mg total) by mouth 2 (two) times daily.   aspirin 81 MG EC tablet Commonly known as: aspirin EC Take 1 tablet (81 mg total) by mouth daily.  Swallow whole.   digoxin 0.125 MG tablet Commonly known as: LANOXIN Take 1 tablet (0.125 mg total) by mouth every evening.   diltiazem 120 MG 24 hr capsule Commonly known as: TIAZAC Take 120 mg by mouth daily.   docusate sodium 100 MG capsule Commonly known as: COLACE Take 100 mg by mouth at bedtime.   famotidine 20 MG tablet Commonly known as: PEPCID Take 1 tablet (20 mg total) by mouth at bedtime. What changed: when to take this   fluticasone 50 MCG/ACT nasal spray Commonly known as: FLONASE Place 2 sprays into both nostrils daily.   furosemide 20 MG tablet Commonly known as: LASIX Take 10 mg by mouth 2 (two) times daily.   gabapentin 300 MG capsule Commonly known as: NEURONTIN Take 300 mg by mouth at bedtime.   ICaps MV Tabs Take  1 tablet by mouth in the morning and at bedtime.   isosorbide mononitrate 30 MG 24 hr tablet Commonly known as: IMDUR Take 0.5 tablets (15 mg total) by mouth daily.   levETIRAcetam 250 MG tablet Commonly known as: KEPPRA Take 125-250 mg by mouth See admin instructions. One tablet at 0900 and half a tablet at 1700   levothyroxine 25 MCG tablet Commonly known as: SYNTHROID Take 25 mcg by mouth daily before breakfast.   melatonin 5 MG Tabs Take 5 mg by mouth at bedtime.   midodrine 2.5 MG tablet Commonly known as: PROAMATINE Take 1 tablet (2.5 mg total) by mouth 2 (two) times daily with a meal. The dosing can be titrated down to once daily when her systolic blood pressure is consistently in the 100s.   montelukast 10 MG tablet Commonly known as: SINGULAIR Take 10 mg by mouth daily.   pantoprazole 40 MG tablet Commonly known as: PROTONIX Take 1 tablet (40 mg total) by mouth daily.   potassium chloride 10 MEQ tablet Commonly known as: KLOR-CON Take 10 mEq by mouth every other day.   sertraline 25 MG tablet Commonly known as: ZOLOFT Take 25 mg by mouth daily.   topiramate 25 MG tablet Commonly known as:  TOPAMAX Take 1 tablet (25 mg total) by mouth daily as needed (for headache).   Trelegy Ellipta 100-62.5-25 MCG/INH Aepb Generic drug: Fluticasone-Umeclidin-Vilant Inhale 1 puff into the lungs daily.       Relevant Imaging Results:  Relevant Lab Results:   Additional Information HHPT and RN.  Karn Cassis, LCSW

## 2020-09-02 NOTE — Discharge Summary (Signed)
Physician Discharge Summary  Deborah [name] XBD:[id number] DOB: 1925/06/09 DOA: 08/31/2020  PCP: Marlowe Aschoff, FNP  Admit date: 08/31/2020 Discharge date: 09/02/2020  Time spent: 35 minutes  Recommendations for Outpatient Follow-up:  1. Repeat basic antibody panel to follow electrolytes and renal function 2. Reassess blood pressure and adjust antihypertensive regimen as needed   Discharge Diagnoses:  Principal Problem:   Acute metabolic encephalopathy Active Problems:   Permanent atrial fibrillation (HCC)   Chronic diastolic CHF (congestive heart failure) (HCC)   Alzheimer disease (HCC)   Seizures (HCC)   Hypokalemia   Generalized weakness   Hypercalcemia   Dehydration   Essential hypertension   Discharge Condition: Stable and improved.  Patient discharged home with instruction to follow-up with PCP in 10 days.  CODE STATUS: DNR  Diet recommendation: Heart healthy diet.  Filed Weights   08/31/20 1913 09/01/20 0400  Weight: 70 kg 67.6 kg    History of present illness:  87 year old female with a history of Alzheimer's dementia, coronary disease, permanent atrial fibrillation, diastolic CHF, hypertension, hyperlipidemia, seizure order presenting with confusion and generalized weakness.  The patient is a poor historian secondary to her Alzheimer's dementia.  History is from review of the medical record and speaking with the patient's daughter at the bedside.  Apparently, the patient was noted to be more confused on 08/29/2020.  She has had decreased oral intake.  Apparently there was some improvement on 08/30/2020, but she became more confused again on 08/31/2020.  The staff at [facility] noted the patient was sitting in a corner trying to defecate.  The patient was brought to emergency department for further evaluation.  The patient's daughter is not aware of any new medications, but she is usually not notified of any changes.  There is been no reports of fevers, chills, chest pain,  shortness breath, vomiting, diarrhea.  At baseline, the patient is pleasantly confused and independent with her ADLs including dressing, bathing, and going to the bathroom. In the emergency department, the patient was afebrile hemodynamically stable with oxygen saturation 97% on room air.  BMP showed sodium 141, potassium 3.1, serum creatinine 1.06.  Serum calcium was 11.6 with albumin 4.2.  WBC 5.0, hemoglobin 13.0, platelets 192,000. the patient was started on IV fluids with improvement of her mental status.  Hospital Course:  Acute metabolic encephalopathy -Primarily driven by the patient's hypercalcemia -Presented with corrected calcium 11.6 -Improved and corrected at the end of discharge. -UA negative for pyuria -Serum B12--456 -Folate 22.9 -Ammonia 13 -CT brain neg for acute intracranial abnormalities. -Continue to maintain adequate hydration -Repeat basic metabolic panel to follow electrolytes at follow-up visit.  Hypercalcemia -This may have been uncovered with the recent assumed discontinuation of Fosamax--review of the medical record shows that the patient was previously on Fosamax, but is not currently on her active medication list. -Patient has been over 10 years on this medication prior to discontinuation. -Calcium was normal one year ago -Other considerations include hyper vitaminosis D vs hyperparathyroidism -d/c 25-vitamin D and calcitriol -Repeat basic metabolic panel follow-up visit to assess electrolytes stability.  Hypothyroidism -Resume home dose of Synthroid.  Permanent atrial fibrillation -Continue digoxin, diltiazem -continue apixaban  Chronic diastolic CHF -Clinically euvolemic -Resume home medication regimen -Continue daily weights.  Physical deconditioning -Home health PT and RN arranged at time of discharge -Continue rehabilitation after discharge.  Essential hypertension -Continue diltiazem, isosorbide and low-dose Lasix -Continue heart  healthy diet -Follow vital signs.  Alzheimer's dementia -Continue constant reorientation and supportive care -Continue  home dose sertraline  Hypokalemia -repleted -mag 1.8 -continue daily maintenance supplementaiton  History of seizure disorders -No seizure appreciated during hospitalization -Continue the use of Keppra.  Procedures:  See below for x-ray reports.  Consultations:  None  Discharge Exam: Vitals:   09/02/20 0533 09/02/20 0729  BP: 138/68   Pulse: (!) 101   Resp: 20   Temp: 98 F (36.7 C)   SpO2: 94% 94%    General: Afebrile, no chest pain, no nausea, no vomiting.  Patient oriented to person and able to follow commands appropriately. Cardiovascular: Rate controlled, no rubs, no gallops, no JVD on exam. Respiratory: No requiring oxygen supplementation.  Good air movement bilaterally.  No wheezing or crackles. Abdomen: Soft, nontender, nondistended, positive bowel sounds Extremities: No cyanosis or clubbing.  Discharge Instructions   Discharge Instructions    Diet - low sodium heart healthy   Complete by: As directed    Discharge instructions   Complete by: As directed    Take medications as prescribed Maintain adequate hydration Repeat basic metabolic panel in 1 we to follow-up renal function and electrolytes stability. Arrange follow-up with PCP in 10 days Follow heart healthy diet and make sure to assess daily weights.   Increase activity slowly   Complete by: As directed      Allergies as of 09/02/2020      Reactions   Ivp Dye [iodinated Diagnostic Agents] Shortness Of Breath   ??   Omnipaque [iohexol] Shortness Of Breath, Other (See Comments)   Short of breath with chest tightness after IV injection in CT, pt was fine when she left department but developed symptoms in parking lot and went to emergency department.   Sulfa Antibiotics Shortness Of Breath   Amiodarone    Carbamazepine    REACTION: toxemia   Dilaudid [hydromorphone]  Nausea And Vomiting   Tramadol Other (See Comments)   confusion      Medication List    STOP taking these medications   alendronate 70 MG tablet Commonly known as: FOSAMAX   amoxicillin-clavulanate 875-125 MG tablet Commonly known as: AUGMENTIN   calcitRIOL 0.5 MCG capsule Commonly known as: ROCALTROL   calcium citrate-vitamin D 500-400 MG-UNIT chewable tablet   diltiazem 30 MG tablet Commonly known as: CARDIZEM   guaiFENesin 600 MG 12 hr tablet Commonly known as: MUCINEX     TAKE these medications   ALPRAZolam 0.25 MG tablet Commonly known as: XANAX Take 1 tablet (0.25 mg total) by mouth every 8 (eight) hours as needed for anxiety.   apixaban 5 MG Tabs tablet Commonly known as: ELIQUIS Take 1 tablet (5 mg total) by mouth 2 (two) times daily.   aspirin 81 MG EC tablet Commonly known as: aspirin EC Take 1 tablet (81 mg total) by mouth daily. Swallow whole.   digoxin 0.125 MG tablet Commonly known as: LANOXIN Take 1 tablet (0.125 mg total) by mouth every evening.   diltiazem 120 MG 24 hr capsule Commonly known as: TIAZAC Take 120 mg by mouth daily.   docusate sodium 100 MG capsule Commonly known as: COLACE Take 100 mg by mouth at bedtime.   famotidine 20 MG tablet Commonly known as: PEPCID Take 1 tablet (20 mg total) by mouth at bedtime. What changed: when to take this   fluticasone 50 MCG/ACT nasal spray Commonly known as: FLONASE Place 2 sprays into both nostrils daily.   furosemide 20 MG tablet Commonly known as: LASIX Take 10 mg by mouth 2 (two) times daily.  gabapentin 300 MG capsule Commonly known as: NEURONTIN Take 300 mg by mouth at bedtime.   ICaps MV Tabs Take 1 tablet by mouth in the morning and at bedtime.   isosorbide mononitrate 30 MG 24 hr tablet Commonly known as: IMDUR Take 0.5 tablets (15 mg total) by mouth daily.   levETIRAcetam 250 MG tablet Commonly known as: KEPPRA Take 125-250 mg by mouth See admin instructions. One  tablet at 0900 and half a tablet at 1700   levothyroxine 25 MCG tablet Commonly known as: SYNTHROID Take 25 mcg by mouth daily before breakfast.   melatonin 5 MG Tabs Take 5 mg by mouth at bedtime.   midodrine 2.5 MG tablet Commonly known as: PROAMATINE Take 1 tablet (2.5 mg total) by mouth 2 (two) times daily with a meal. The dosing can be titrated down to once daily when her systolic blood pressure is consistently in the 100s.   montelukast 10 MG tablet Commonly known as: SINGULAIR Take 10 mg by mouth daily.   pantoprazole 40 MG tablet Commonly known as: PROTONIX Take 1 tablet (40 mg total) by mouth daily.   potassium chloride 10 MEQ tablet Commonly known as: KLOR-CON Take 10 mEq by mouth every other day.   sertraline 25 MG tablet Commonly known as: ZOLOFT Take 25 mg by mouth daily.   topiramate 25 MG tablet Commonly known as: TOPAMAX Take 1 tablet (25 mg total) by mouth daily as needed (for headache).   Trelegy Ellipta 100-62.5-25 MCG/INH Aepb Generic drug: Fluticasone-Umeclidin-Vilant Inhale 1 puff into the lungs daily.      Allergies  Allergen Reactions  . Ivp Dye [Iodinated Diagnostic Agents] Shortness Of Breath    ??  . Omnipaque [Iohexol] Shortness Of Breath and Other (See Comments)    Short of breath with chest tightness after IV injection in CT, pt was fine when she left department but developed symptoms in parking lot and went to emergency department.  . Sulfa Antibiotics Shortness Of Breath  . Amiodarone   . Carbamazepine     REACTION: toxemia  . Dilaudid [Hydromorphone] Nausea And Vomiting  . Tramadol Other (See Comments)    confusion    The results of significant diagnostics from this hospitalization (including imaging, microbiology, ancillary and laboratory) are listed below for reference.    Significant Diagnostic Studies: CT Head Wo Contrast  Result Date: 08/31/2020 CLINICAL DATA:  Altered mental status, hypotension EXAM: CT HEAD WITHOUT  CONTRAST TECHNIQUE: Contiguous axial images were obtained from the base of the skull through the vertex without intravenous contrast. COMPARISON:  07/10/2020 FINDINGS: Brain: Normal anatomic configuration. Moderate parenchymal volume loss is commensurate with the patient's age. Moderate subcortical and periventricular white matter changes are present likely reflecting the sequela of small vessel ischemia. Remote pontine lacunar infarct noted. No abnormal intra or extra-axial mass lesion or fluid collection. No abnormal mass effect or midline shift. No evidence of acute intracranial hemorrhage or infarct. Mild ventriculomegaly is commensurate with the degree of parenchymal volume loss and likely reflects the sequela of central atrophy. Cerebellum unremarkable. Vascular: No asymmetric hyperdense vasculature at the skull base. Skull: Intact Sinuses/Orbits: Paranasal sinuses are clear. Orbits are unremarkable. Other: Mastoid air cells and middle ear cavities are clear. IMPRESSION: No acute intracranial abnormality. Advanced senescent changes, stable since prior examination. Remote right pontine infarct. Electronically Signed   By: Helyn NumbersAshesh  Parikh MD   On: 08/31/2020 22:19   DG Chest Port 1 View  Result Date: 08/31/2020 CLINICAL DATA:  Confusion.  Possible sepsis.  EXAM: PORTABLE CHEST 1 VIEW COMPARISON:  Chest x-ray dated July 10, 2020. FINDINGS: Unchanged cardiomegaly. Normal pulmonary vascularity. Unchanged biapical pleuroparenchymal scarring medially. No focal consolidation, pleural effusion, or pneumothorax. No acute osseous abnormality. IMPRESSION: 1. No active disease. Electronically Signed   By: Obie Dredge M.D.   On: 08/31/2020 21:40    Microbiology: Recent Results (from the past 240 hour(s))  Blood Culture (routine x 2)     Status: None (Preliminary result)   Collection Time: 08/31/20  9:50 PM   Specimen: BLOOD RIGHT FOREARM  Result Value Ref Range Status   Specimen Description BLOOD RIGHT  FOREARM  Final   Special Requests   Final    BOTTLES DRAWN AEROBIC AND ANAEROBIC Blood Culture adequate volume   Culture   Final    NO GROWTH 2 DAYS Performed at [facility], [address], [place], [place] [address]    Report Status PENDING  Incomplete  Blood Culture (routine x 2)     Status: None (Preliminary result)   Collection Time: 08/31/20 11:08 PM   Specimen: BLOOD  Result Value Ref Range Status   Specimen Description BLOOD LEFT ANTECUBITAL  Final   Special Requests   Final    BOTTLES DRAWN AEROBIC AND ANAEROBIC Blood Culture adequate volume   Culture   Final    NO GROWTH 2 DAYS Performed at [facility], [address], [place], [place] [address]    Report Status PENDING  Incomplete  Resp Panel by RT-PCR (Flu A&B, Covid) Nasopharyngeal Swab     Status: None   Collection Time: 09/01/20 12:50 AM   Specimen: Nasopharyngeal Swab; Nasopharyngeal(NP) swabs in vial transport medium  Result Value Ref Range Status   SARS Coronavirus 2 by RT PCR NEGATIVE NEGATIVE Final    Comment: (NOTE) SARS-CoV-2 target nucleic acids are NOT DETECTED.  The SARS-CoV-2 RNA is generally detectable in upper respiratory specimens during the acute phase of infection. The lowest concentration of SARS-CoV-2 viral copies this assay can detect is 138 copies/mL. A negative result does not preclude SARS-Cov-2 infection and should not be used as the sole basis for treatment or other patient management decisions. A negative result may occur with  improper specimen collection/handling, submission of specimen other than nasopharyngeal swab, presence of viral mutation(s) within the areas targeted by this assay, and inadequate number of viral copies(<138 copies/mL). A negative result must be combined with clinical observations, patient history, and epidemiological information. The expected result is Negative.  Fact Sheet for Patients:  BloggerCourse.com  Fact Sheet for Healthcare  Providers:  SeriousBroker.it  This test is no t yet approved or cleared by the [place] FDA and  has been authorized for detection and/or diagnosis of SARS-CoV-2 by FDA under an Emergency Use Authorization (EUA). This EUA will remain  in effect (meaning this test can be used) for the duration of the COVID-19 declaration under Section 564(b)(1) of the Act, 21 U.S.C.section 360bbb-3(b)(1), unless the authorization is terminated  or revoked sooner.       Influenza A by PCR NEGATIVE NEGATIVE Final   Influenza B by PCR NEGATIVE NEGATIVE Final    Comment: (NOTE) The Xpert Xpress SARS-CoV-2/FLU/RSV plus assay is intended as an aid in the diagnosis of influenza from Nasopharyngeal swab specimens and should not be used as a sole basis for treatment. Nasal washings and aspirates are unacceptable for Xpert Xpress SARS-CoV-2/FLU/RSV testing.  Fact Sheet for Patients: BloggerCourse.com  Fact Sheet for Healthcare Providers: SeriousBroker.it  This test is not yet approved  or cleared by the [place] and has been authorized for detection and/or diagnosis of SARS-CoV-2 by FDA under an Emergency Use Authorization (EUA). This EUA will remain in effect (meaning this test can be used) for the duration of the COVID-19 declaration under Section 564(b)(1) of the Act, 21 U.S.C. section 360bbb-3(b)(1), unless the authorization is terminated or revoked.  Performed at [facility], [address] [place], [place] [address]   Urine culture     Status: None   Collection Time: 09/01/20 12:57 AM   Specimen: In/Out Cath Urine  Result Value Ref Range Status   Specimen Description   Final    IN/OUT CATH URINE Performed at [facility], [address] [place], [place] [address]    Special Requests   Final    NONE Performed at [facility], [address], [place], [place] [address]    Culture   Final    NO  GROWTH Performed at [facility], [address], [place], [place] [address]    Report Status 09/02/2020 FINAL  Final  MRSA PCR Screening     Status: Abnormal   Collection Time: 09/01/20  4:06 AM   Specimen: Nasopharyngeal  Result Value Ref Range Status   MRSA by PCR POSITIVE (A) NEGATIVE Final    Comment:        The GeneXpert MRSA Assay (FDA approved for NASAL specimens only), is one component of a comprehensive MRSA colonization surveillance program. It is not intended to diagnose MRSA infection nor to guide or monitor treatment for MRSA infections. RESULT CALLED TO, READ BACK BY AND VERIFIED WITH: ASHLEY Penix 4/12 @ 0757 BY Chauncey Mann Performed at [facility], [address], [place], [place] [address]      Labs: Basic Metabolic Panel: Recent Labs  Lab 08/31/20 2150 09/01/20 0656 09/01/20 0825 09/02/20 0501  NA 141 145  --  143  K 3.1* 3.5  --  4.2  CL 100 103  --  107  CO2 29 30  --  26  GLUCOSE 105* 99  --  101*  BUN 25* 20  --  14  CREATININE 1.06* 0.90  --  0.80  CALCIUM 11.6* 10.5*  --  9.9  MG  --  1.8 1.8  --   PHOS  --  3.5  --   --    Liver Function Tests: Recent Labs  Lab 08/31/20 2150 09/01/20 0656 09/02/20 0501  AST 21 20 20   ALT 14 14 15   ALKPHOS 69 63 64  BILITOT 0.4 0.4 0.6  PROT 7.0 6.3* 6.4*  ALBUMIN 4.2 3.8 3.9   Recent Labs  Lab 09/01/20 0825  AMMONIA 13   CBC: Recent Labs  Lab 08/31/20 2150 09/01/20 0656  WBC 5.0 4.9  NEUTROABS 3.6  --   HGB 13.0 12.4  HCT 40.4 39.0  MCV 94.2 95.1  PLT 192 154    Signed:  10/31/20 MD.  [facility] 09/02/2020, 1:05 PM

## 2020-09-05 LAB — CULTURE, BLOOD (ROUTINE X 2)
Culture: NO GROWTH
Culture: NO GROWTH
Special Requests: ADEQUATE
Special Requests: ADEQUATE

## 2020-09-07 ENCOUNTER — Other Ambulatory Visit (HOSPITAL_COMMUNITY)
Admission: RE | Admit: 2020-09-07 | Discharge: 2020-09-07 | Disposition: A | Discharge status: Home / Self Care | Payer: Medicare Other | Source: Skilled Nursing Facility | Attending: Family Medicine | Admitting: Family Medicine

## 2020-09-07 DIAGNOSIS — I110 Hypertensive heart disease with heart failure: Secondary | ICD-10-CM | POA: Diagnosis not present

## 2020-09-07 DIAGNOSIS — G9341 Metabolic encephalopathy: Secondary | ICD-10-CM | POA: Insufficient documentation

## 2020-09-07 DIAGNOSIS — I5030 Unspecified diastolic (congestive) heart failure: Secondary | ICD-10-CM | POA: Diagnosis not present

## 2020-09-07 LAB — BASIC METABOLIC PANEL
Anion gap: 10 (ref 5–15)
BUN: 20 mg/dL (ref 8–23)
CO2: 27 mmol/L (ref 22–32)
Calcium: 9.3 mg/dL (ref 8.9–10.3)
Chloride: 105 mmol/L (ref 98–111)
Creatinine, Ser: 1 mg/dL (ref 0.44–1.00)
GFR, Estimated: 53 mL/min — ABNORMAL LOW (ref >60)
Glucose, Bld: 115 mg/dL — ABNORMAL HIGH (ref 70–99)
Potassium: 4.3 mmol/L (ref 3.5–5.1)
Sodium: 142 mmol/L (ref 135–145)

## 2020-10-24 ENCOUNTER — Emergency Department (HOSPITAL_COMMUNITY): Payer: Medicare Other

## 2020-10-24 ENCOUNTER — Encounter (HOSPITAL_COMMUNITY): Payer: Self-pay

## 2020-10-24 ENCOUNTER — Emergency Department (HOSPITAL_COMMUNITY)
Admission: EM | Admit: 2020-10-24 | Discharge: 2020-10-24 | Disposition: A | Discharge status: Home / Self Care | Payer: Medicare Other | Attending: Emergency Medicine | Admitting: Emergency Medicine

## 2020-10-24 ENCOUNTER — Other Ambulatory Visit: Payer: Self-pay

## 2020-10-24 DIAGNOSIS — I110 Hypertensive heart disease with heart failure: Secondary | ICD-10-CM | POA: Diagnosis not present

## 2020-10-24 DIAGNOSIS — S32810A Multiple fractures of pelvis with stable disruption of pelvic ring, initial encounter for closed fracture: Secondary | ICD-10-CM

## 2020-10-24 DIAGNOSIS — I5032 Chronic diastolic (congestive) heart failure: Secondary | ICD-10-CM | POA: Diagnosis not present

## 2020-10-24 DIAGNOSIS — R1030 Lower abdominal pain, unspecified: Secondary | ICD-10-CM | POA: Diagnosis not present

## 2020-10-24 DIAGNOSIS — Z7901 Long term (current) use of anticoagulants: Secondary | ICD-10-CM | POA: Insufficient documentation

## 2020-10-24 DIAGNOSIS — Z7982 Long term (current) use of aspirin: Secondary | ICD-10-CM | POA: Diagnosis not present

## 2020-10-24 DIAGNOSIS — M25551 Pain in right hip: Secondary | ICD-10-CM | POA: Insufficient documentation

## 2020-10-24 DIAGNOSIS — I2510 Atherosclerotic heart disease of native coronary artery without angina pectoris: Secondary | ICD-10-CM | POA: Insufficient documentation

## 2020-10-24 DIAGNOSIS — W19XXXA Unspecified fall, initial encounter: Secondary | ICD-10-CM | POA: Insufficient documentation

## 2020-10-24 DIAGNOSIS — Z87891 Personal history of nicotine dependence: Secondary | ICD-10-CM | POA: Diagnosis not present

## 2020-10-24 DIAGNOSIS — J45909 Unspecified asthma, uncomplicated: Secondary | ICD-10-CM | POA: Insufficient documentation

## 2020-10-24 DIAGNOSIS — Z79899 Other long term (current) drug therapy: Secondary | ICD-10-CM | POA: Diagnosis not present

## 2020-10-24 DIAGNOSIS — N3000 Acute cystitis without hematuria: Secondary | ICD-10-CM

## 2020-10-24 DIAGNOSIS — J449 Chronic obstructive pulmonary disease, unspecified: Secondary | ICD-10-CM | POA: Diagnosis not present

## 2020-10-24 LAB — URINALYSIS, ROUTINE W REFLEX MICROSCOPIC
Bilirubin Urine: NEGATIVE
Glucose, UA: NEGATIVE mg/dL
Hgb urine dipstick: NEGATIVE
Ketones, ur: NEGATIVE mg/dL
Nitrite: POSITIVE — AB
Protein, ur: NEGATIVE mg/dL
Specific Gravity, Urine: 1.018 (ref 1.005–1.030)
WBC, UA: >50 WBC/hpf — ABNORMAL HIGH (ref 0–5)
pH: 6 (ref 5.0–8.0)

## 2020-10-24 MED ORDER — OXYCODONE-ACETAMINOPHEN 5-325 MG PO TABS
1.0000 | ORAL_TABLET | Freq: Once | ORAL | Status: AC
Start: 2020-10-24 — End: 2020-10-24
  Administered 2020-10-24: 1 via ORAL
  Filled 2020-10-24: qty 1

## 2020-10-24 MED ORDER — ACETAMINOPHEN 500 MG PO TABS
1000.0000 mg | ORAL_TABLET | Freq: Once | ORAL | Status: AC
  Administered 2020-10-24: 1000 mg via ORAL
  Filled 2020-10-24: qty 2

## 2020-10-24 MED ORDER — CEPHALEXIN 500 MG PO CAPS: 500.0000 mg | ORAL_CAPSULE | Freq: Four times a day (QID) | ORAL | 0 refills | Status: DC

## 2020-10-24 MED ORDER — OXYCODONE-ACETAMINOPHEN 5-325 MG PO TABS: 1.0000 | ORAL_TABLET | ORAL | 0 refills | Status: DC | PRN

## 2020-10-24 MED ORDER — CEPHALEXIN 500 MG PO CAPS
1000.0000 mg | ORAL_CAPSULE | Freq: Once | ORAL | Status: AC
  Administered 2020-10-24: 1000 mg via ORAL
  Filled 2020-10-24: qty 2

## 2020-10-24 NOTE — ED Notes (Signed)
Staff have been in patients room multiple times, pt yelling out help me.  Each time staff goes into room she is wanting someone to sit with her 1:1, we continue to reinforce to patient that we will round on her every hour.  Each time we go into the room we ask the patient if she needs anything and each time she states I want someone to sit with me now.  Will continue to monitor.

## 2020-10-24 NOTE — ED Notes (Signed)
Attempted to ambulate pt. Pt complains of pelvic pain and is able to stand but not take steps. Dr. Clayborne Dana notified.

## 2020-10-24 NOTE — ED Provider Notes (Signed)
[facility] EMERGENCY DEPARTMENT Provider Note   CSN: [id number] Arrival date & time: 10/24/20  0231     History Chief Complaint  Patient presents with  . Hip Pain    Deborah [name] is a 87 y.o. female.  Acute onset of pelvic pain while in bed. Facility thinks she might have fell however, they didn't see it and there was no report of same. No previous symptoms. Difficulty ambulating because of pain     Hip Pain       Past Medical History:  Diagnosis Date  . Alzheimer disease (HCC)   . Asthma   . Atrial fibrillation (HCC)    Not anticoagulated with history of bleeding and fall risk  . Chronic diastolic heart failure (HCC)   . COPD (chronic obstructive pulmonary disease) (HCC)   . Coronary atherosclerosis of native coronary artery    BMS circumflex 2009  . Dementia without behavioral disturbance (HCC)   . Drug intolerance    Flecacinide and Amiodarone   . Fracture of multiple pubic rami (HCC) 12/13   Left superior and inferior - following fall  . Hypotension   . Mixed hyperlipidemia   . Osteoarthritis   . Retroperitoneal hemorrhage 12/13   Following fall  . Seizures (HCC)    Remote history of over 20 years ago  . Umbilical hernia     Patient Active Problem List   Diagnosis Date Noted  . Acute metabolic encephalopathy 09/01/2020  . Generalized weakness 09/01/2020  . Hypercalcemia 09/01/2020  . Dehydration 09/01/2020  . Essential hypertension 09/01/2020  . Acute pulmonary embolism without acute cor pulmonale (HCC) 07/20/2019  . Sepsis due to undetermined organism (HCC) 07/18/2019  . AKI (acute kidney injury) (HCC) 07/18/2019  . Acute cystitis without hematuria   . Sepsis (HCC) 07/17/2019  . COVID-19 virus infection 07/17/2019  . Chest pain 09/02/2017  . Compression fracture of body of thoracic vertebra (HCC) 06/22/2017  . Atrial fibrillation with rapid ventricular response (HCC)   . Screening for colorectal cancer 03/24/2017  . Hemorrhoids 03/24/2017  .  Itching in the vaginal area 03/24/2017  . Vulval thrush 03/24/2017  . Hypoxemia   . Alzheimer disease (HCC) 09/13/2016  . Seizures (HCC) 09/13/2016  . Hypokalemia 09/13/2016  . COPD exacerbation (HCC) 09/13/2016  . Hypotension 04/22/2012  . CAD S/P percutaneous coronary angioplasty   . COPD (chronic obstructive pulmonary disease) (HCC)   . Mixed hyperlipidemia 05/14/2009  . Permanent atrial fibrillation (HCC) 02/10/2009  . Chronic diastolic CHF (congestive heart failure) (HCC) 02/10/2009    Past Surgical History:  Procedure Laterality Date  . CATARACT EXTRACTION    . Radiofrequency catheter ablation     Failed  . Right carpel tunnel release    . TONSILLECTOMY       OB History    Gravida  1   Para  1   Term  1   Preterm      AB      Living  1     SAB      IAB      Ectopic      Multiple      Live Births  1           Family History  Problem Relation Age of Onset  . Coronary artery disease Mother   . Tuberculosis Paternal Grandfather   . Heart attack Maternal Grandmother     Social History   Tobacco Use  . Smoking status: Former Smoker    Packs/day:  0.80    Years: 25.00    Pack years: 20.00    Types: Cigarettes    Quit date: 05/23/1990    Years since quitting: 30.4  . Smokeless tobacco: Never Used  Vaping Use  . Vaping Use: Never used  Substance Use Topics  . Alcohol use: No    Alcohol/week: 0.0 standard drinks  . Drug use: No    Home Medications Prior to Admission medications   Medication Sig Start Date End Date Taking? Authorizing Provider  ALPRAZolam (XANAX) 0.25 MG tablet Take 1 tablet (0.25 mg total) by mouth every 8 (eight) hours as needed for anxiety. 07/21/19   [name], MD  apixaban (ELIQUIS) 5 MG TABS tablet Take 1 tablet (5 mg total) by mouth 2 (two) times daily. 09/02/20   [name], MD  aspirin (ASPIRIN EC) 81 MG EC tablet Take 1 tablet (81 mg total) by mouth daily. Swallow whole. 05/04/12   [name], MD  digoxin  (LANOXIN) 0.125 MG tablet Take 1 tablet (0.125 mg total) by mouth every evening. 09/21/16   [name], [name], MD  diltiazem [facility]) 120 MG 24 hr capsule Take 120 mg by mouth daily. 06/18/20   [provider]  docusate sodium (COLACE) 100 MG capsule Take 100 mg by mouth at bedtime.    [provider]  famotidine (PEPCID) 20 MG tablet Take 1 tablet (20 mg total) by mouth at bedtime. 09/02/20   [name], MD  fluticasone [facility]) 50 MCG/ACT nasal spray Place 2 sprays into both nostrils daily.    [provider]  furosemide (LASIX) 20 MG tablet Take 10 mg by mouth 2 (two) times daily.     [provider]  gabapentin (NEURONTIN) 300 MG capsule Take 300 mg by mouth at bedtime.    [provider]  isosorbide mononitrate (IMDUR) 30 MG 24 hr tablet Take 0.5 tablets (15 mg total) by mouth daily. 09/03/17   Erick Blinks, MD  levETIRAcetam (KEPPRA) 250 MG tablet Take 125-250 mg by mouth See admin instructions. One tablet at 0900 and half a tablet at 1700    [provider]  levothyroxine (SYNTHROID, LEVOTHROID) 25 MCG tablet Take 25 mcg by mouth daily before breakfast.    [provider]  melatonin 5 MG TABS Take 5 mg by mouth at bedtime.    [provider]  midodrine (PROAMATINE) 2.5 MG tablet Take 1 tablet (2.5 mg total) by mouth 2 (two) times daily with a meal. The dosing can be titrated down to once daily when her systolic blood pressure is consistently in the 100s. 05/04/12   [name], MD  montelukast (SINGULAIR) 10 MG tablet Take 10 mg by mouth daily.  09/13/17   [provider]  Multiple Vitamins-Minerals (ICAPS MV) TABS Take 1 tablet by mouth in the morning and at bedtime.    [provider]  pantoprazole (PROTONIX) 40 MG tablet Take 1 tablet (40 mg total) by mouth daily. Patient not taking: Reported on 09/01/2020 09/29/17   Abelino Derrick, PA-C  potassium chloride (K-DUR,KLOR-CON) 10 MEQ tablet  Take 10 mEq by mouth every other day. 05/27/18   [provider]  sertraline (ZOLOFT) 25 MG tablet Take 25 mg by mouth daily.    [provider]  topiramate (TOPAMAX) 25 MG tablet Take 1 tablet (25 mg total) by mouth daily as needed (for headache). 09/02/20   [name], MD  TRELEGY ELLIPTA 100-62.5-25 MCG/INH AEPB Inhale 1 puff into the lungs daily. 07/07/20  [provider]    Allergies    Ivp dye [iodinated diagnostic agents], Omnipaque [iohexol], Sulfa antibiotics, Amiodarone, Carbamazepine, Dilaudid [hydromorphone], and Tramadol  Review of Systems   Review of Systems  All other systems reviewed and are negative.   Physical Exam Updated Vital Signs BP 113/79 (BP Location: Right Arm)   Pulse 69   Temp 97.8 F (36.6 C) (Oral)   Resp 16   Ht 5\' 5"  (1.651 m)   Wt 68 kg   SpO2 100%   BMI 24.96 kg/m   Physical Exam Vitals and nursing note reviewed.  Constitutional:      Appearance: She is well-developed.  HENT:     Head: Normocephalic and atraumatic.     Mouth/Throat:     Mouth: Mucous membranes are moist.     Pharynx: Oropharynx is clear.  Cardiovascular:     Rate and Rhythm: Normal rate and regular rhythm.  Pulmonary:     Effort: No respiratory distress.     Breath sounds: No stridor.  Abdominal:     General: There is no distension.     Tenderness: There is abdominal tenderness (suprapubic).  Musculoskeletal:        General: No swelling or tenderness. Normal range of motion.     Cervical back: Normal range of motion.  Skin:    General: Skin is warm and dry.  Neurological:     Mental Status: She is alert. Mental status is at baseline.     ED Results / Procedures / Treatments   Labs (all labs ordered are listed, but only abnormal results are displayed) Labs Reviewed - No data to display  EKG None  Radiology DG Hip Unilat W or Wo Pelvis 2-3 Views Right  Result Date: 10/24/2020 CLINICAL DATA:  Status post fall.  Right hip pain.  EXAM: DG HIP (WITH OR WITHOUT PELVIS) 2-3V RIGHT COMPARISON:  X-ray pelvis 07/10/2020 FINDINGS: There is no evidence of hip fracture or dislocation. Cortical irregularity of the left inferior pubic rami likely represents an old healed fracture. Degenerative changes of the lumbar spine. There is no evidence of arthropathy or other focal bone abnormality. Vascular calcifications. IMPRESSION: Negative for acute injury. Electronically Signed   By: Tish FredericksonMorgane  Naveau M.D.   On: 10/24/2020 03:28    Procedures Procedures   Medications Ordered in ED Medications  acetaminophen (TYLENOL) tablet 1,000 mg (1,000 mg Oral Given 10/24/20 0255)    ED Course  I have reviewed the triage vital signs and the nursing notes.  Pertinent labs & imaging results that were available during my care of the patient were reviewed by me and considered in my medical decision making (see chart for details).    MDM Rules/Calculators/A&P                         eval for fx/UTI/etc.  Patient ultimately found to have a UTI and pelvic fractures. Discussed with RCC at her ALF and they reassured me they could get PT on Monday and that patietn already had a wheelchair and they had capacity to take care of her in this state. Talked with daughter who agreed and preferred that route. Rx's written for pain meds and Abx. TOC consulted and face to face orders placed in case of need to help get PT.  Final Clinical Impression(s) / ED Diagnoses Final diagnoses:  None    Rx / DC Orders ED Discharge Orders    None  Marily Memos, MD 10/24/20 2303

## 2020-10-24 NOTE — ED Triage Notes (Signed)
Pt arrived via [facility] c/o right hip pain. Per [facility] facility, [place] @ [phone] reports the previous shift told her the Pt fell, however there was no incident report or fall paperwork done recording the Pts fall earlier in the evening. Pt reports she did not fall and pain began while she was laying in bed. Pt normally ambulates with a Walker, but reports the pain is so bad she cannot bear weight on right side.

## 2020-10-24 NOTE — ED Notes (Signed)
EMS here to take pt to [facility].

## 2020-10-24 NOTE — ED Notes (Signed)
Pt pulled off her brief. Pt was dry; new, clean brief placed

## 2020-10-24 NOTE — ED Notes (Addendum)
Bladder scan reveals , reported to Dr. Clayborne Dana. I and O cath performed receiving ~ foul smelling semi cloudy yellow urine. Specimens sent to lab.

## 2020-10-24 NOTE — ED Notes (Signed)
Report called to [place], [place] at [facility].

## 2020-10-26 ENCOUNTER — Telehealth: Payer: Self-pay | Admitting: Orthopedic Surgery

## 2020-10-26 LAB — URINE CULTURE: Culture: >=100000 — AB

## 2020-10-26 NOTE — Telephone Encounter (Signed)
Called back to facility; again spoke with [facility]; aware of 6 week appointment.

## 2020-10-26 NOTE — Telephone Encounter (Signed)
I called with [facility] orders and told her you would call with appointment

## 2020-10-26 NOTE — Telephone Encounter (Signed)
Tell them wbat with walker   F/u 6 weeks

## 2020-10-26 NOTE — Telephone Encounter (Signed)
Call received from [facility] facility med tech Loyal Gambler, relaying that patient was treated at [facility] Emergency room early a.m 10/24/20, following a fall. Xray indicates: "IMPRESSION: . Acute comminuted superior and inferior right pubic rami ffractures." Ms Thurmond Butts relays patient was discharged back to their facility, with no instruction to see orthopaedics, however, with physical therapy orders. States therapist is asking about weight bearing and has other questions.  Please advise if okay to schedule; aware it may not have been Dr Romeo Apple on call at time patient's care began in Emergency room. PH [phone]

## 2020-10-27 ENCOUNTER — Telehealth: Payer: Self-pay | Admitting: Emergency Medicine

## 2020-10-27 NOTE — Telephone Encounter (Signed)
Post ED Visit - Positive Culture Follow-up  Culture report reviewed by antimicrobial stewardship pharmacist: [facility] []  , Pharm.D. []  Enzo Bi, Pharm.D., BCPS AQ-ID []  , Pharm.D., BCPS []  Celedonio Miyamoto, Pharm.D., BCPS []  [place], Garvin Fila.D., BCPS, AAHIVP []  , Pharm.D., BCPS, AAHIVP []  Georgina Pillion, PharmD, BCPS []  , PharmD, BCPS []  [place], PharmD, BCPS []  [place], PharmD []  , PharmD, BCPS []  Estella Husk, PharmD  Pharmacy Team []  Lysle Pearl, PharmD []  , PharmD []  Phillips Climes, PharmD []  , Rph []  Agapito Games) , PharmD []  Verlan Friends, PharmD []  , PharmD []  Mervyn Gay, PharmD []  , PharmD []  Vinnie Level, PharmD []  Wonda Olds, PharmD []  , PharmD []  Len Childs, PharmD   Positive urine culture Treated with cephalexin, organism sensitive to the same and no further patient follow-up is required at this time.  10/27/2020, 11:54 AM

## 2020-11-28 ENCOUNTER — Emergency Department (HOSPITAL_COMMUNITY): Payer: Medicare Other

## 2020-11-28 ENCOUNTER — Emergency Department (HOSPITAL_COMMUNITY)
Admission: EM | Admit: 2020-11-28 | Discharge: 2020-11-28 | Disposition: A | Discharge status: Skilled Nursing Facility | Payer: Medicare Other | Attending: Emergency Medicine | Admitting: Emergency Medicine

## 2020-11-28 ENCOUNTER — Encounter (HOSPITAL_COMMUNITY): Payer: Self-pay | Admitting: *Deleted

## 2020-11-28 DIAGNOSIS — Z87891 Personal history of nicotine dependence: Secondary | ICD-10-CM | POA: Insufficient documentation

## 2020-11-28 DIAGNOSIS — I110 Hypertensive heart disease with heart failure: Secondary | ICD-10-CM | POA: Insufficient documentation

## 2020-11-28 DIAGNOSIS — Z8616 Personal history of COVID-19: Secondary | ICD-10-CM | POA: Diagnosis not present

## 2020-11-28 DIAGNOSIS — Z955 Presence of coronary angioplasty implant and graft: Secondary | ICD-10-CM | POA: Insufficient documentation

## 2020-11-28 DIAGNOSIS — Z79899 Other long term (current) drug therapy: Secondary | ICD-10-CM | POA: Diagnosis not present

## 2020-11-28 DIAGNOSIS — J45909 Unspecified asthma, uncomplicated: Secondary | ICD-10-CM | POA: Diagnosis not present

## 2020-11-28 DIAGNOSIS — J441 Chronic obstructive pulmonary disease with (acute) exacerbation: Secondary | ICD-10-CM | POA: Diagnosis not present

## 2020-11-28 DIAGNOSIS — R2241 Localized swelling, mass and lump, right lower limb: Secondary | ICD-10-CM | POA: Diagnosis not present

## 2020-11-28 DIAGNOSIS — I5032 Chronic diastolic (congestive) heart failure: Secondary | ICD-10-CM | POA: Insufficient documentation

## 2020-11-28 DIAGNOSIS — Z7901 Long term (current) use of anticoagulants: Secondary | ICD-10-CM | POA: Insufficient documentation

## 2020-11-28 DIAGNOSIS — Z7951 Long term (current) use of inhaled steroids: Secondary | ICD-10-CM | POA: Insufficient documentation

## 2020-11-28 DIAGNOSIS — I2510 Atherosclerotic heart disease of native coronary artery without angina pectoris: Secondary | ICD-10-CM | POA: Diagnosis not present

## 2020-11-28 DIAGNOSIS — M79604 Pain in right leg: Secondary | ICD-10-CM

## 2020-11-28 DIAGNOSIS — M25559 Pain in unspecified hip: Secondary | ICD-10-CM | POA: Diagnosis present

## 2020-11-28 DIAGNOSIS — I4891 Unspecified atrial fibrillation: Secondary | ICD-10-CM | POA: Insufficient documentation

## 2020-11-28 DIAGNOSIS — F0280 Dementia in other diseases classified elsewhere without behavioral disturbance: Secondary | ICD-10-CM | POA: Insufficient documentation

## 2020-11-28 DIAGNOSIS — K429 Umbilical hernia without obstruction or gangrene: Secondary | ICD-10-CM | POA: Diagnosis not present

## 2020-11-28 DIAGNOSIS — G309 Alzheimer's disease, unspecified: Secondary | ICD-10-CM | POA: Insufficient documentation

## 2020-11-28 NOTE — ED Triage Notes (Signed)
Pt from [facility], hx dementia and pelvic fx, staff called EMS r/t pt fall. Per EMS staff had assisted pt back into bed when they arrived. Pt is poor historian, but is currently complaining of right leg pain.

## 2020-11-28 NOTE — ED Triage Notes (Signed)
Pt has bruise on left collar bone, c/o left rib pain, heel pain in both heels, and has a skin tear on right leg that was wrapped in coban, making the LLE edematous.

## 2020-11-28 NOTE — ED Notes (Signed)
This nurse gave Darl Pikes (pt's daughter) an update concerning her negative ultrasound.

## 2020-11-28 NOTE — ED Provider Notes (Signed)
[facility] EMERGENCY DEPARTMENT Provider Note   CSN: [id number] Arrival date & time: 11/28/20  [id number]     History Chief Complaint  Patient presents with   Leg Injury   Hip Pain    Deborah [name] is a 87 y.o. female.  The history is provided by the nursing home and a relative.  Hip Pain This is a chronic problem. The current episode started more than 1 week ago. The problem occurs constantly. The problem has not changed since onset.Pertinent negatives include no chest pain, no abdominal pain, no headaches and no shortness of breath. Nothing aggravates the symptoms. Nothing relieves the symptoms. She has tried nothing for the symptoms.      Past Medical History:  Diagnosis Date   Alzheimer disease (HCC)    Asthma    Atrial fibrillation (HCC)    Not anticoagulated with history of bleeding and fall risk   Chronic diastolic heart failure (HCC)    COPD (chronic obstructive pulmonary disease) (HCC)    Coronary atherosclerosis of native coronary artery    BMS circumflex 2009   Dementia without behavioral disturbance (HCC)    Drug intolerance    Flecacinide and Amiodarone    Fracture of multiple pubic rami (HCC) 12/13   Left superior and inferior - following fall   Hypotension    Mixed hyperlipidemia    Osteoarthritis    Retroperitoneal hemorrhage 12/13   Following fall   Seizures (HCC)    Remote history of over 20 years ago   Umbilical hernia     Patient Active Problem List   Diagnosis Date Noted   Acute metabolic encephalopathy 09/01/2020   Generalized weakness 09/01/2020   Hypercalcemia 09/01/2020   Dehydration 09/01/2020   Essential hypertension 09/01/2020   Acute pulmonary embolism without acute cor pulmonale (HCC) 07/20/2019   Sepsis due to undetermined organism (HCC) 07/18/2019   AKI (acute kidney injury) (HCC) 07/18/2019   Acute cystitis without hematuria    Sepsis (HCC) 07/17/2019   COVID-19 virus infection 07/17/2019   Chest pain 09/02/2017   Compression  fracture of body of thoracic vertebra (HCC) 06/22/2017   Atrial fibrillation with rapid ventricular response (HCC)    Screening for colorectal cancer 03/24/2017   Hemorrhoids 03/24/2017   Itching in the vaginal area 03/24/2017   Vulval thrush 03/24/2017   Hypoxemia    Alzheimer disease (HCC) 09/13/2016   Seizures (HCC) 09/13/2016   Hypokalemia 09/13/2016   COPD exacerbation (HCC) 09/13/2016   Hypotension 04/22/2012   CAD S/P percutaneous coronary angioplasty    COPD (chronic obstructive pulmonary disease) (HCC)    Mixed hyperlipidemia 05/14/2009   Permanent atrial fibrillation (HCC) 02/10/2009   Chronic diastolic CHF (congestive heart failure) (HCC) 02/10/2009    Past Surgical History:  Procedure Laterality Date   CATARACT EXTRACTION     Radiofrequency catheter ablation     Failed   Right carpel tunnel release     TONSILLECTOMY       OB History     Gravida  1   Para  1   Term  1   Preterm      AB      Living  1      SAB      IAB      Ectopic      Multiple      Live Births  1           Family History  Problem Relation Age of Onset   Coronary artery disease  Mother    Tuberculosis Paternal Grandfather    Heart attack Maternal Grandmother     Social History   Tobacco Use   Smoking status: Former    Packs/day: 0.80    Years: 25.00    Pack years: 20.00    Types: Cigarettes    Quit date: 05/23/1990    Years since quitting: 30.5   Smokeless tobacco: Never  Vaping Use   Vaping Use: Never used  Substance Use Topics   Alcohol use: No    Alcohol/week: 0.0 standard drinks   Drug use: No    Home Medications Prior to Admission medications   Medication Sig Start Date End Date Taking? Authorizing Provider  ALPRAZolam (XANAX) 0.25 MG tablet Take 1 tablet (0.25 mg total) by mouth every 8 (eight) hours as needed for anxiety. 07/21/19   [name], MD  apixaban (ELIQUIS) 5 MG TABS tablet Take 1 tablet (5 mg total) by mouth 2 (two) times daily. 09/02/20    [name], MD  aspirin (ASPIRIN EC) 81 MG EC tablet Take 1 tablet (81 mg total) by mouth daily. Swallow whole. 05/04/12   [name], MD  cephALEXin (KEFLEX) 500 MG capsule Take 1 capsule (500 mg total) by mouth 4 (four) times daily. 10/24/20   Kindrick Lankford, Barbara Cower, MD  digoxin (LANOXIN) 0.125 MG tablet Take 1 tablet (0.125 mg total) by mouth every evening. 09/21/16   [name], [name], MD  diltiazem [facility]) 120 MG 24 hr capsule Take 120 mg by mouth daily. 06/18/20   [provider]  docusate sodium (COLACE) 100 MG capsule Take 100 mg by mouth at bedtime.    [provider]  famotidine (PEPCID) 20 MG tablet Take 1 tablet (20 mg total) by mouth at bedtime. 09/02/20   [name], MD  fluticasone [facility]) 50 MCG/ACT nasal spray Place 2 sprays into both nostrils daily.    [provider]  furosemide (LASIX) 20 MG tablet Take 10 mg by mouth 2 (two) times daily.     [provider]  gabapentin (NEURONTIN) 300 MG capsule Take 300 mg by mouth at bedtime.    [provider]  isosorbide mononitrate (IMDUR) 30 MG 24 hr tablet Take 0.5 tablets (15 mg total) by mouth daily. 09/03/17   Erick Blinks, MD  levETIRAcetam (KEPPRA) 250 MG tablet Take 125-250 mg by mouth See admin instructions. One tablet at 0900 and half a tablet at 1700    [provider]  levothyroxine (SYNTHROID, LEVOTHROID) 25 MCG tablet Take 25 mcg by mouth daily before breakfast.    [provider]  melatonin 5 MG TABS Take 5 mg by mouth at bedtime.    [provider]  midodrine (PROAMATINE) 2.5 MG tablet Take 1 tablet (2.5 mg total) by mouth 2 (two) times daily with a meal. The dosing can be titrated down to once daily when her systolic blood pressure is consistently in the 100s. 05/04/12   [name], MD  montelukast (SINGULAIR) 10 MG tablet Take 10 mg by mouth daily.  09/13/17   [provider]  Multiple Vitamins-Minerals (ICAPS MV) TABS Take 1  tablet by mouth in the morning and at bedtime.    [provider]  oxyCODONE-acetaminophen (PERCOCET) 5-325 MG tablet Take 1 tablet by mouth every 4 (four) hours as needed for severe pain. 10/24/20   Neesha Langton, Barbara Cower, MD  pantoprazole (PROTONIX) 40 MG tablet Take 1 tablet (40 mg total) by mouth daily. Patient not taking: Reported on 09/01/2020 09/29/17  Corine Shelter K, PA-C  potassium chloride (K-DUR,KLOR-CON) 10 MEQ tablet Take 10 mEq by mouth every other day. 05/27/18   [provider]  sertraline (ZOLOFT) 25 MG tablet Take 25 mg by mouth daily.    [provider]  topiramate (TOPAMAX) 25 MG tablet Take 1 tablet (25 mg total) by mouth daily as needed (for headache). 09/02/20   [name], MD  TRELEGY ELLIPTA 100-62.5-25 MCG/INH AEPB Inhale 1 puff into the lungs daily. 07/07/20   [provider]    Allergies    Ivp dye [iodinated diagnostic agents], Omnipaque [iohexol], Sulfa antibiotics, Amiodarone, Carbamazepine, Dilaudid [hydromorphone], and Tramadol  Review of Systems   Review of Systems  Respiratory:  Negative for shortness of breath.   Cardiovascular:  Negative for chest pain.  Gastrointestinal:  Negative for abdominal pain.  Neurological:  Negative for headaches.  All other systems reviewed and are negative.  Physical Exam Updated Vital Signs BP 123/86   Pulse 97   Temp 97.8 F (36.6 C)   Resp 15   Ht 5\' 8"  (1.727 m)   Wt 59 kg   SpO2 95%   BMI 19.77 kg/m   Physical Exam Vitals and nursing note reviewed.  Constitutional:      Appearance: She is well-developed.  HENT:     Head: Normocephalic and atraumatic.  Cardiovascular:     Rate and Rhythm: Normal rate and regular rhythm.  Pulmonary:     Effort: No respiratory distress.     Breath sounds: No stridor.  Abdominal:     General: There is no distension.  Musculoskeletal:     Cervical back: Normal range of motion.  Neurological:     Mental Status: She is alert.    ED Results /  Procedures / Treatments   Labs (all labs ordered are listed, but only abnormal results are displayed) Labs Reviewed - No data to display  EKG EKG Interpretation  Date/Time:  Saturday November 28 2020 03:28:15 EDT Ventricular Rate:  95 PR Interval:    QRS Duration: 95 QT Interval:  299 QTC Calculation: 376 R Axis:   37 Text Interpretation: Atrial fibrillation Repol abnrm suggests ischemia, diffuse leads Confirmed by [id number] [phone]) on 11/28/2020 4:48:42 AM  Radiology DG Chest 1 View  Result Date: 11/28/2020 CLINICAL DATA:  Fall today with rib pain the left EXAM: CHEST  1 VIEW COMPARISON:  08/31/2020 FINDINGS: Cardiomegaly and aortic tortuosity. There is no edema, consolidation, effusion, or pneumothorax. No detected fracture. IMPRESSION: Stable exam.  No acute finding. Electronically Signed   By: 10/31/2020 M.D.   On: 11/28/2020 04:40   DG Hip Unilat  With Pelvis 2-3 Views Right  Result Date: 11/28/2020 CLINICAL DATA:  Right hip pain after fall EXAM: DG HIP (WITH OR WITHOUT PELVIS) 2-3V RIGHT COMPARISON:  10/24/2020 FINDINGS: Both hips appear intact and located. Similar alignment of a right obturator ring fracture which was noted on prior. Remote and healed appearing left obturator ring fracture. Prominent osteopenia and extensive atheromatous calcification. IMPRESSION: 1. Recent right obturator ring fractures without interval displacement. 2. No new abnormality. Electronically Signed   By: 12/24/2020 M.D.   On: 11/28/2020 04:31    Procedures Procedures   Medications Ordered in ED Medications - No data to display  ED Course  I have reviewed the triage vital signs and the nursing notes.  Pertinent labs & imaging results that were available during my care of the patient were reviewed by me and considered in my medical  decision making (see chart for details).    MDM Rules/Calculators/A&P                          No obvious traumatic injuries.  Patient has a right lower  extremity swelling.  No evidence of fracture there is suggest that is the cause.  Initially thought to maybe be related to a tight Coban however Coban off for a couple hours and did not improve.  Will need ultrasound.  Care transferred pending same.   Final Clinical Impression(s) / ED Diagnoses Final diagnoses:  None    Rx / DC Orders ED Discharge Orders     None        Garon Melander, Barbara Cower, MD 11/28/20 [id number]

## 2020-11-28 NOTE — Discharge Instructions (Addendum)
Follow-up with orthopedics.  Continue physical therapy.  Also follow-up with primary doctor.  Come back to ER for uncontrolled pain, new falls or other new concerning symptom.

## 2020-11-28 NOTE — ED Provider Notes (Signed)
Signout note  87 year old lady presented to ER with concern for right leg pain and swelling.  In June had fall, pelvic fractures.  Ortho recommending weightbearing as tolerated.  Imaging today demonstrate healing fractures of bilateral pubic rami without significant change in alignment.  Probable nondisplaced proximal fracture of right sacrum likely chronic, ankle x-ray negative.  DVT study ordered and pending at time of signout.  7:00 AM received signout from [place]  11:24 AM reviewed ultrasound results, no DVT.  Will discharge patient home.  Updated patient on results.  She is currently resting in bed in no acute distress.  Vitals are stable.  Recommend outpatient follow-up with Ortho.   Milagros Loll, MD 11/28/20 1124

## 2020-12-07 ENCOUNTER — Ambulatory Visit (INDEPENDENT_AMBULATORY_CARE_PROVIDER_SITE_OTHER): Payer: Medicare Other | Admitting: Orthopedic Surgery

## 2020-12-07 ENCOUNTER — Encounter: Payer: Self-pay | Admitting: Orthopedic Surgery

## 2020-12-07 ENCOUNTER — Other Ambulatory Visit: Payer: Self-pay

## 2020-12-07 VITALS — BP 117/57 | HR 84

## 2020-12-07 DIAGNOSIS — S32591A Other specified fracture of right pubis, initial encounter for closed fracture: Secondary | ICD-10-CM

## 2020-12-07 NOTE — Progress Notes (Signed)
FOLLOW UP   Encounter Diagnosis  Name Primary?   Closed fracture of ramus of right pubis, initial encounter [facility]) Yes     Chief Complaint  Patient presents with   hip fracture    Follow up   Established patient in the practice with a new right pubic ramus fracture sustained around June 6.  Advice was given to treat the patient for stable pelvic fracture she is here for the first visit regarding this injury  She is here with the [facility] nursing home caregiver who says that she has been doing fine with her hip and pelvis although she does have a right heel ulcer which is being handled by primary care and home health she did show me a picture  Her initial films showed a superior and inferior pubic ramus fracture nondisplaced this was also noted on CAT scan which I have reviewed both images and agree with those impressions  She then had a CAT scan on July 9 which also reviewed and the fractures are stable and have healed  Patient is in a wheelchair she moves her hips fine without pain I am discharging her.

## 2021-06-22 ENCOUNTER — Other Ambulatory Visit: Payer: Self-pay

## 2021-06-22 ENCOUNTER — Encounter (HOSPITAL_COMMUNITY): Payer: Self-pay

## 2021-06-22 ENCOUNTER — Emergency Department (HOSPITAL_COMMUNITY)
Admission: EM | Admit: 2021-06-22 | Discharge: 2021-06-23 | Disposition: A | Discharge status: Home / Self Care | Payer: Medicare Other | Attending: Emergency Medicine | Admitting: Emergency Medicine

## 2021-06-22 DIAGNOSIS — S0003XA Contusion of scalp, initial encounter: Secondary | ICD-10-CM | POA: Insufficient documentation

## 2021-06-22 DIAGNOSIS — Z7901 Long term (current) use of anticoagulants: Secondary | ICD-10-CM | POA: Diagnosis not present

## 2021-06-22 DIAGNOSIS — Z7982 Long term (current) use of aspirin: Secondary | ICD-10-CM | POA: Diagnosis not present

## 2021-06-22 DIAGNOSIS — W19XXXA Unspecified fall, initial encounter: Secondary | ICD-10-CM | POA: Diagnosis not present

## 2021-06-22 DIAGNOSIS — M25521 Pain in right elbow: Secondary | ICD-10-CM | POA: Insufficient documentation

## 2021-06-22 DIAGNOSIS — F0390 Unspecified dementia without behavioral disturbance: Secondary | ICD-10-CM | POA: Diagnosis not present

## 2021-06-22 DIAGNOSIS — Y92002 Bathroom of unspecified non-institutional (private) residence single-family (private) house as the place of occurrence of the external cause: Secondary | ICD-10-CM | POA: Insufficient documentation

## 2021-06-22 DIAGNOSIS — S0990XA Unspecified injury of head, initial encounter: Secondary | ICD-10-CM | POA: Diagnosis present

## 2021-06-22 NOTE — ED Provider Notes (Signed)
[facility] EMERGENCY DEPARTMENT Provider Note   CSN: [id number] Arrival date & time: 06/22/21  2342     History  No chief complaint on file.   Deborah [name] is a 88 y.o. female.  Patient brought to the emergency department from home after a fall.  Patient had a ground-level fall in the bathroom.  Patient has a history of dementia.  At arrival she states "I am fine".  She denies any pain currently.      Home Medications Prior to Admission medications   Medication Sig Start Date End Date Taking? Authorizing Provider  ALPRAZolam (XANAX) 0.25 MG tablet Take 1 tablet (0.25 mg total) by mouth every 8 (eight) hours as needed for anxiety. 07/21/19   [name], MD  aspirin (ASPIRIN EC) 81 MG EC tablet Take 1 tablet (81 mg total) by mouth daily. Swallow whole. 05/04/12   [name], MD  D-5000 125 MCG (5000 UT) TABS Take 5,000 Units by mouth daily. 11/11/20   [provider]  digoxin (LANOXIN) 0.125 MG tablet Take 1 tablet (0.125 mg total) by mouth every evening. 09/21/16   [name], [name], MD  diltiazem [facility]) 120 MG 24 hr capsule Take 120 mg by mouth daily. 06/18/20   [provider]  docusate sodium (COLACE) 100 MG capsule Take 100 mg by mouth at bedtime.    [provider]  ELIQUIS 2.5 MG TABS tablet Take 2.5 mg by mouth 2 (two) times daily. 11/17/20   [provider]  famotidine (PEPCID) 20 MG tablet Take 1 tablet (20 mg total) by mouth at bedtime. 09/02/20   [name], MD  fluticasone [facility]) 50 MCG/ACT nasal spray Place 2 sprays into both nostrils daily.    [provider]  furosemide (LASIX) 20 MG tablet Take 10 mg by mouth daily.    [provider]  gabapentin (NEURONTIN) 300 MG capsule Take 300 mg by mouth at bedtime.    [provider]  isosorbide mononitrate (IMDUR) 30 MG 24 hr tablet Take 0.5 tablets (15 mg total) by mouth daily. 09/03/17   Erick Blinks, MD  levETIRAcetam (KEPPRA) 250 MG tablet  Take 125-250 mg by mouth See admin instructions. One tablet at 0900 and half a tablet at 1700    [provider]  levothyroxine (SYNTHROID, LEVOTHROID) 25 MCG tablet Take 25 mcg by mouth daily before breakfast.    [provider]  melatonin 5 MG TABS Take 5 mg by mouth at bedtime.    [provider]  midodrine (PROAMATINE) 2.5 MG tablet Take 1 tablet (2.5 mg total) by mouth 2 (two) times daily with a meal. The dosing can be titrated down to once daily when her systolic blood pressure is consistently in the 100s. 05/04/12   [name], MD  montelukast (SINGULAIR) 10 MG tablet Take 10 mg by mouth daily.  09/13/17   [provider]  Multiple Vitamins-Minerals (ICAPS MV) TABS Take 1 tablet by mouth in the morning and at bedtime.    [provider]  oxyCODONE-acetaminophen (PERCOCET) 5-325 MG tablet Take 1 tablet by mouth every 4 (four) hours as needed for severe pain. 10/24/20   Mesner, Barbara Cower, MD  pantoprazole (PROTONIX) 20 MG tablet Take 20 mg by mouth every morning. 12/05/20   [provider]  potassium chloride (K-DUR,KLOR-CON) 10 MEQ tablet Take 10 mEq by mouth every other day. 05/27/18   [provider]  sertraline (ZOLOFT) 25 MG tablet Take 25 mg by mouth daily.  [provider]  TRELEGY ELLIPTA 100-62.5-25 MCG/INH AEPB Inhale 1 puff into the lungs daily. 07/07/20   [provider]      Allergies    Ivp dye [iodinated contrast media], Omnipaque [iohexol], Sulfa antibiotics, Amiodarone, Carbamazepine, Dilaudid [hydromorphone], and Tramadol    Review of Systems   Review of Systems  Neurological:  Negative for syncope.   Physical Exam Updated Vital Signs BP (!) 96/56    Pulse 92    Temp 97.8 F (36.6 C) (Oral)    Resp 16    Ht 5\' 8"  (1.727 m)    Wt 59 kg    SpO2 98%    BMI 19.78 kg/m  Physical Exam Vitals and nursing note reviewed.  Constitutional:      General: She is not in acute distress.    Appearance: She  is well-developed.  HENT:     Head: Normocephalic. Contusion present.   Eyes:     Conjunctiva/sclera: Conjunctivae normal.  Cardiovascular:     Rate and Rhythm: Normal rate and regular rhythm.     Heart sounds: No murmur heard. Pulmonary:     Effort: Pulmonary effort is normal. No respiratory distress.     Breath sounds: Normal breath sounds.  Abdominal:     Palpations: Abdomen is soft.     Tenderness: There is no abdominal tenderness.  Musculoskeletal:        General: No swelling.     Cervical back: Neck supple.  Skin:    General: Skin is warm and dry.     Capillary Refill: Capillary refill takes less than 2 seconds.  Neurological:     Mental Status: She is alert.  Psychiatric:        Mood and Affect: Mood normal.    ED Results / Procedures / Treatments   Labs (all labs ordered are listed, but only abnormal results are displayed) Labs Reviewed - No data to display  EKG None  Radiology CT HEAD WO CONTRAST ( )  Result Date: 06/23/2021 CLINICAL DATA:  Unwitnessed fall. EXAM: CT HEAD WITHOUT CONTRAST CT CERVICAL SPINE WITHOUT CONTRAST TECHNIQUE: Multidetector CT imaging of the head and cervical spine was performed following the standard protocol without intravenous contrast. Multiplanar CT image reconstructions of the cervical spine were also generated. RADIATION DOSE REDUCTION: This exam was performed according to the departmental dose-optimization program which includes automated exposure control, adjustment of the mA and/or kV according to patient size and/or use of iterative reconstruction technique. COMPARISON:  None. FINDINGS: CT HEAD FINDINGS Brain: No evidence of acute infarction, hemorrhage, hydrocephalus, extra-axial collection or mass lesion/mass effect. Again seen is moderate diffuse atrophy and mild periventricular white matter hypodensity, likely chronic small vessel ischemic change. Vascular: No hyperdense vessel or unexpected calcification. Skull: Normal. Negative  for fracture or focal lesion. Sinuses/Orbits: No acute finding. Other: None. CT CERVICAL SPINE FINDINGS Alignment: Normal. Skull base and vertebrae: No acute fracture. No primary bone lesion or focal pathologic process. Bones are osteopenic. Soft tissues and spinal canal: No prevertebral fluid or swelling. No visible canal hematoma. Disc levels: There is intervertebral disc space narrowing and endplate osteophyte formation at C4-C5 and C6-C7 compatible with degenerative change. No significant central canal or neural foraminal stenosis at any level. Upper chest: Negative. Other: Examination is mildly technically limited secondary to motion artifact. IMPRESSION: 1.  No acute intracranial process. 2. No acute fracture or traumatic subluxation of the cervical spine. Examination is mildly technically limited secondary to motion artifact. 3. Stable moderate diffuse brain atrophy  and mild chronic small vessel ischemic change. Electronically Signed   By: Ronney Asters M.D.   On: 06/23/2021 00:45   CT CERVICAL SPINE WO CONTRAST  Result Date: 06/23/2021 CLINICAL DATA:  Unwitnessed fall. EXAM: CT HEAD WITHOUT CONTRAST CT CERVICAL SPINE WITHOUT CONTRAST TECHNIQUE: Multidetector CT imaging of the head and cervical spine was performed following the standard protocol without intravenous contrast. Multiplanar CT image reconstructions of the cervical spine were also generated. RADIATION DOSE REDUCTION: This exam was performed according to the departmental dose-optimization program which includes automated exposure control, adjustment of the mA and/or kV according to patient size and/or use of iterative reconstruction technique. COMPARISON:  None. FINDINGS: CT HEAD FINDINGS Brain: No evidence of acute infarction, hemorrhage, hydrocephalus, extra-axial collection or mass lesion/mass effect. Again seen is moderate diffuse atrophy and mild periventricular white matter hypodensity, likely chronic small vessel ischemic change.  Vascular: No hyperdense vessel or unexpected calcification. Skull: Normal. Negative for fracture or focal lesion. Sinuses/Orbits: No acute finding. Other: None. CT CERVICAL SPINE FINDINGS Alignment: Normal. Skull base and vertebrae: No acute fracture. No primary bone lesion or focal pathologic process. Bones are osteopenic. Soft tissues and spinal canal: No prevertebral fluid or swelling. No visible canal hematoma. Disc levels: There is intervertebral disc space narrowing and endplate osteophyte formation at C4-C5 and C6-C7 compatible with degenerative change. No significant central canal or neural foraminal stenosis at any level. Upper chest: Negative. Other: Examination is mildly technically limited secondary to motion artifact. IMPRESSION: 1.  No acute intracranial process. 2. No acute fracture or traumatic subluxation of the cervical spine. Examination is mildly technically limited secondary to motion artifact. 3. Stable moderate diffuse brain atrophy and mild chronic small vessel ischemic change. Electronically Signed   By: Ronney Asters M.D.   On: 06/23/2021 00:45    Procedures Procedures    Medications Ordered in ED Medications - No data to display  ED Course/ Medical Decision Making/ A&P                           Medical Decision Making Amount and/or Complexity of Data Reviewed Radiology: ordered.   Patient with ground-level fall.  She is on Eliquis.  She has a very small contusion on the posterior scalp but otherwise appears well.  No loss of consciousness.  CT head and cervical spine are negative.  Patient now complaining of right elbow pain.  She has a history of chronic bursitis.  I offered x-ray but daughter declines, thinks it is her chronic pain.  Recommended x-ray if its not better in the near future.  This can be done at the nursing home.  Discharge to [facility].        Final Clinical Impression(s) / ED Diagnoses Final diagnoses:  Contusion of scalp, initial encounter     Rx / DC Orders ED Discharge Orders     None         Shawndra Clute, Gwenyth Allegra, MD 06/23/21 [phone]

## 2021-06-22 NOTE — ED Triage Notes (Signed)
Rcems from [place] with cc of unwitnessed fall. Pt has hx of dementia . When ems got there she was sitting on the floor in the bathroom. She was trying to use her walker when she slipped. Possibly hit her head on the wall.  Family in room.

## 2021-06-23 ENCOUNTER — Emergency Department (HOSPITAL_COMMUNITY): Payer: Medicare Other

## 2021-06-23 DIAGNOSIS — S0003XA Contusion of scalp, initial encounter: Secondary | ICD-10-CM | POA: Diagnosis not present

## 2021-06-23 NOTE — ED Notes (Signed)
Gave report to [facility] . Waiting on ems transport

## 2021-06-23 NOTE — ED Notes (Signed)
Ems d/c pt back to [place]

## 2021-06-23 NOTE — ED Notes (Signed)
Notified [facility] of patient needing transportation back to [place].

## 2021-07-03 ENCOUNTER — Encounter (HOSPITAL_COMMUNITY): Payer: Self-pay

## 2021-07-03 ENCOUNTER — Other Ambulatory Visit: Payer: Self-pay

## 2021-07-03 ENCOUNTER — Emergency Department (HOSPITAL_COMMUNITY): Payer: Medicare Other

## 2021-07-03 ENCOUNTER — Inpatient Hospital Stay (HOSPITAL_COMMUNITY)
Admission: EM | Admit: 2021-07-03 | Discharge: 2021-07-07 | DRG: 291 | Disposition: A | Discharge status: Home Health | Payer: Medicare Other | Attending: Internal Medicine | Admitting: Internal Medicine

## 2021-07-03 DIAGNOSIS — D500 Iron deficiency anemia secondary to blood loss (chronic): Secondary | ICD-10-CM | POA: Diagnosis present

## 2021-07-03 DIAGNOSIS — Z87891 Personal history of nicotine dependence: Secondary | ICD-10-CM | POA: Diagnosis not present

## 2021-07-03 DIAGNOSIS — I959 Hypotension, unspecified: Secondary | ICD-10-CM | POA: Diagnosis present

## 2021-07-03 DIAGNOSIS — J189 Pneumonia, unspecified organism: Secondary | ICD-10-CM

## 2021-07-03 DIAGNOSIS — Z955 Presence of coronary angioplasty implant and graft: Secondary | ICD-10-CM | POA: Diagnosis not present

## 2021-07-03 DIAGNOSIS — G309 Alzheimer's disease, unspecified: Secondary | ICD-10-CM | POA: Diagnosis present

## 2021-07-03 DIAGNOSIS — R7989 Other specified abnormal findings of blood chemistry: Secondary | ICD-10-CM | POA: Diagnosis not present

## 2021-07-03 DIAGNOSIS — R0602 Shortness of breath: Secondary | ICD-10-CM | POA: Diagnosis present

## 2021-07-03 DIAGNOSIS — Z79890 Hormone replacement therapy (postmenopausal): Secondary | ICD-10-CM

## 2021-07-03 DIAGNOSIS — Z888 Allergy status to other drugs, medicaments and biological substances status: Secondary | ICD-10-CM

## 2021-07-03 DIAGNOSIS — J440 Chronic obstructive pulmonary disease with acute lower respiratory infection: Secondary | ICD-10-CM | POA: Diagnosis present

## 2021-07-03 DIAGNOSIS — Z882 Allergy status to sulfonamides status: Secondary | ICD-10-CM

## 2021-07-03 DIAGNOSIS — D649 Anemia, unspecified: Secondary | ICD-10-CM | POA: Diagnosis not present

## 2021-07-03 DIAGNOSIS — R718 Other abnormality of red blood cells: Secondary | ICD-10-CM | POA: Diagnosis not present

## 2021-07-03 DIAGNOSIS — I4821 Permanent atrial fibrillation: Secondary | ICD-10-CM | POA: Diagnosis present

## 2021-07-03 DIAGNOSIS — I2510 Atherosclerotic heart disease of native coronary artery without angina pectoris: Secondary | ICD-10-CM | POA: Diagnosis present

## 2021-07-03 DIAGNOSIS — J441 Chronic obstructive pulmonary disease with (acute) exacerbation: Secondary | ICD-10-CM | POA: Diagnosis present

## 2021-07-03 DIAGNOSIS — E039 Hypothyroidism, unspecified: Secondary | ICD-10-CM | POA: Diagnosis present

## 2021-07-03 DIAGNOSIS — I110 Hypertensive heart disease with heart failure: Principal | ICD-10-CM | POA: Diagnosis present

## 2021-07-03 DIAGNOSIS — Z885 Allergy status to narcotic agent status: Secondary | ICD-10-CM

## 2021-07-03 DIAGNOSIS — J9601 Acute respiratory failure with hypoxia: Secondary | ICD-10-CM | POA: Diagnosis present

## 2021-07-03 DIAGNOSIS — Z91041 Radiographic dye allergy status: Secondary | ICD-10-CM

## 2021-07-03 DIAGNOSIS — I5033 Acute on chronic diastolic (congestive) heart failure: Secondary | ICD-10-CM | POA: Diagnosis present

## 2021-07-03 DIAGNOSIS — L89610 Pressure ulcer of right heel, unstageable: Secondary | ICD-10-CM | POA: Diagnosis present

## 2021-07-03 DIAGNOSIS — K219 Gastro-esophageal reflux disease without esophagitis: Secondary | ICD-10-CM | POA: Diagnosis present

## 2021-07-03 DIAGNOSIS — I5021 Acute systolic (congestive) heart failure: Secondary | ICD-10-CM | POA: Diagnosis not present

## 2021-07-03 DIAGNOSIS — Z66 Do not resuscitate: Secondary | ICD-10-CM | POA: Diagnosis present

## 2021-07-03 DIAGNOSIS — D75839 Thrombocytosis, unspecified: Secondary | ICD-10-CM

## 2021-07-03 DIAGNOSIS — G40909 Epilepsy, unspecified, not intractable, without status epilepticus: Secondary | ICD-10-CM | POA: Diagnosis present

## 2021-07-03 DIAGNOSIS — F0390 Unspecified dementia without behavioral disturbance: Secondary | ICD-10-CM | POA: Diagnosis not present

## 2021-07-03 DIAGNOSIS — Z20822 Contact with and (suspected) exposure to covid-19: Secondary | ICD-10-CM | POA: Diagnosis present

## 2021-07-03 DIAGNOSIS — Z8249 Family history of ischemic heart disease and other diseases of the circulatory system: Secondary | ICD-10-CM | POA: Diagnosis not present

## 2021-07-03 DIAGNOSIS — F0280 Dementia in other diseases classified elsewhere without behavioral disturbance: Secondary | ICD-10-CM | POA: Diagnosis present

## 2021-07-03 DIAGNOSIS — Z7982 Long term (current) use of aspirin: Secondary | ICD-10-CM

## 2021-07-03 DIAGNOSIS — Z79899 Other long term (current) drug therapy: Secondary | ICD-10-CM

## 2021-07-03 DIAGNOSIS — Z7951 Long term (current) use of inhaled steroids: Secondary | ICD-10-CM

## 2021-07-03 DIAGNOSIS — I950 Idiopathic hypotension: Secondary | ICD-10-CM

## 2021-07-03 LAB — CBC WITH DIFFERENTIAL/PLATELET
Abs Immature Granulocytes: 0.05 10*3/uL (ref 0.00–0.07)
Basophils Absolute: 0 10*3/uL (ref 0.0–0.1)
Basophils Relative: 0 %
Eosinophils Absolute: 0 10*3/uL (ref 0.0–0.5)
Eosinophils Relative: 0 %
HCT: 18.4 % — ABNORMAL LOW (ref 36.0–46.0)
Hemoglobin: 5.8 g/dL — CL (ref 12.0–15.0)
Immature Granulocytes: 1 %
Lymphocytes Relative: 17 %
Lymphs Abs: 0.7 10*3/uL (ref 0.7–4.0)
MCH: 33.5 pg (ref 26.0–34.0)
MCHC: 31.5 g/dL (ref 30.0–36.0)
MCV: 106.4 fL — ABNORMAL HIGH (ref 80.0–100.0)
Monocytes Absolute: 0.1 10*3/uL (ref 0.1–1.0)
Monocytes Relative: 3 %
Neutro Abs: 3.3 10*3/uL (ref 1.7–7.7)
Neutrophils Relative %: 79 %
Platelets: 565 10*3/uL — ABNORMAL HIGH (ref 150–400)
RBC: 1.73 MIL/uL — ABNORMAL LOW (ref 3.87–5.11)
RDW: 19.4 % — ABNORMAL HIGH (ref 11.5–15.5)
WBC: 4.2 10*3/uL (ref 4.0–10.5)
nRBC: 0 % (ref 0.0–0.2)

## 2021-07-03 LAB — BASIC METABOLIC PANEL
Anion gap: 7 (ref 5–15)
BUN: 21 mg/dL (ref 8–23)
CO2: 27 mmol/L (ref 22–32)
Calcium: 8.9 mg/dL (ref 8.9–10.3)
Chloride: 102 mmol/L (ref 98–111)
Creatinine, Ser: 0.73 mg/dL (ref 0.44–1.00)
GFR, Estimated: >60 mL/min (ref >60)
Glucose, Bld: 109 mg/dL — ABNORMAL HIGH (ref 70–99)
Potassium: 4.3 mmol/L (ref 3.5–5.1)
Sodium: 136 mmol/L (ref 135–145)

## 2021-07-03 LAB — LACTIC ACID, PLASMA: Lactic Acid, Venous: 1.1 mmol/L (ref 0.5–1.9)

## 2021-07-03 LAB — PREPARE RBC (CROSSMATCH)

## 2021-07-03 LAB — POC OCCULT BLOOD, ED: Fecal Occult Bld: NEGATIVE

## 2021-07-03 MED ORDER — SODIUM CHLORIDE 0.9 % IV BOLUS
1000.0000 mL | Freq: Once | INTRAVENOUS | Status: AC
  Administered 2021-07-03: 1000 mL via INTRAVENOUS

## 2021-07-03 MED ORDER — SODIUM CHLORIDE 0.9 % IV SOLN
2.0000 g | Freq: Once | INTRAVENOUS | Status: AC
  Administered 2021-07-03: 2 g via INTRAVENOUS
  Filled 2021-07-03: qty 20

## 2021-07-03 MED ORDER — SODIUM CHLORIDE 0.9 % IV SOLN
500.0000 mg | Freq: Once | INTRAVENOUS | Status: AC
  Administered 2021-07-03: 500 mg via INTRAVENOUS
  Filled 2021-07-03: qty 5

## 2021-07-03 MED ORDER — SODIUM CHLORIDE 0.9 % IV SOLN: 10.0000 mL/h | Freq: Once | INTRAVENOUS | Status: DC

## 2021-07-03 NOTE — ED Provider Notes (Signed)
The patient is a DNR.  I spoke with the patient's daughter who is a retired Engineer, civil (consulting) and she wants the patient to be DNI along with no heroic measures like central lines or pressor agents.  She would like the patient to get some blood and if necessary BiPAP would be fine.   Bethann Berkshire, MD 07/03/21 2342

## 2021-07-03 NOTE — ED Triage Notes (Signed)
Per EMS pt had SOB this morning and gave Xanax. SOB went away. Pt had SOB again this evening. Not time for xanax. Bought for evaluation for cough, running nose, upper resp wheezing. AxOx4 per EMS. Walks with walker. No oxygen at home.  SPO2: 88-86% RA 96% 4L   144/62 BP 54 HR 14 RR

## 2021-07-03 NOTE — ED Notes (Signed)
Date and time results received: 07/03/21 2200 (use smartphrase ".now" to insert current time)  Test: hgb Critical Value: 5.8  Name of Provider Notified: Clelia Schaumann, MD

## 2021-07-03 NOTE — ED Notes (Addendum)
Pt is confused at times. Feces noted on fingernails to both hands. Fingers cleaned.

## 2021-07-03 NOTE — ED Provider Notes (Signed)
[facility] EMERGENCY DEPARTMENT Provider Note   CSN: [id number] Arrival date & time: 07/03/21  1809     History  Chief Complaint  Patient presents with   Shortness of Breath    Deborah [name] is a 88 y.o. female.  Patient with a history of heart failure coronary artery disease COPD and dementia.  Patient presents with some shortness of breath.  The history is provided by the patient and medical records. No language interpreter was used.  Shortness of Breath Severity:  Moderate Onset quality:  Sudden Timing:  Constant Progression:  Worsening Chronicity:  New Context: activity   Relieved by:  Nothing Worsened by:  Nothing Ineffective treatments:  None tried     Home Medications Prior to Admission medications   Medication Sig Start Date End Date Taking? Authorizing Provider  ALPRAZolam (XANAX) 0.25 MG tablet Take 1 tablet (0.25 mg total) by mouth every 8 (eight) hours as needed for anxiety. 07/21/19  Yes Tat, Shanon Brow, MD  aspirin (ASPIRIN EC) 81 MG EC tablet Take 1 tablet (81 mg total) by mouth daily. Swallow whole. 05/04/12  Yes Rexene Alberts, MD  D-5000 125 MCG (5000 UT) TABS Take 5,000 Units by mouth daily. 11/11/20  Yes [provider]  digoxin (LANOXIN) 0.125 MG tablet Take 1 tablet (0.125 mg total) by mouth every evening. 09/21/16  Yes Isaac Bliss, Rayford Halsted, MD  diltiazem [facility]) 120 MG 24 hr capsule Take 120 mg by mouth daily. 06/18/20  Yes [provider]  docusate sodium (COLACE) 100 MG capsule Take 100 mg by mouth at bedtime.   Yes [provider]  famotidine (PEPCID) 20 MG tablet Take 1 tablet (20 mg total) by mouth at bedtime. 09/02/20  Yes Barton Dubois, MD  fluticasone [facility]) 50 MCG/ACT nasal spray Place 2 sprays into both nostrils daily.   Yes [provider]  furosemide (LASIX) 20 MG tablet Take 10 mg by mouth daily.   Yes [provider]  isosorbide mononitrate (IMDUR) 30 MG 24 hr tablet Take 0.5 tablets (15 mg  total) by mouth daily. 09/03/17  Yes Kathie Dike, MD  levETIRAcetam (KEPPRA) 250 MG tablet Take 125-250 mg by mouth See admin instructions. One tablet at 0900 and half a tablet at 1700   Yes [provider]  levothyroxine (SYNTHROID, LEVOTHROID) 25 MCG tablet Take 25 mcg by mouth daily before breakfast.   Yes [provider]  melatonin 5 MG TABS Take 5 mg by mouth at bedtime.   Yes [provider]  midodrine (PROAMATINE) 2.5 MG tablet Take 1 tablet (2.5 mg total) by mouth 2 (two) times daily with a meal. The dosing can be titrated down to once daily when her systolic blood pressure is consistently in the 100s. 05/04/12  Yes Rexene Alberts, MD  montelukast (SINGULAIR) 10 MG tablet Take 10 mg by mouth daily.  09/13/17  Yes [provider]  Multiple Vitamins-Minerals (ICAPS MV) TABS Take 1 tablet by mouth in the morning and at bedtime.   Yes [provider]  potassium chloride (K-DUR,KLOR-CON) 10 MEQ tablet Take 10 mEq by mouth every other day. 05/27/18  Yes [provider]  sertraline (ZOLOFT) 25 MG tablet Take 25 mg by mouth daily.   Yes [provider]  TRELEGY ELLIPTA 100-62.5-25 MCG/INH AEPB Inhale 1 puff into the lungs daily. 07/07/20  Yes [provider]  oxyCODONE-acetaminophen (PERCOCET) 5-325 MG tablet Take 1 tablet by mouth every 4 (four) hours as needed for severe pain. Patient not taking:  Reported on 07/03/2021 10/24/20   Mesner, Corene Cornea, MD      Allergies    Ivp dye [iodinated contrast media], Omnipaque [iohexol], Sulfa antibiotics, Amiodarone, Carbamazepine, Dilaudid [hydromorphone], and Tramadol    Review of Systems   Review of Systems  Unable to perform ROS: Dementia  Respiratory:  Positive for shortness of breath.    Physical Exam Updated Vital Signs BP 107/75    Pulse (!) 113    Temp 97.7 F (36.5 C) (Oral)    Resp 18    SpO2 100%  Physical Exam Vitals and nursing note reviewed.  Constitutional:       Appearance: She is well-developed.  HENT:     Head: Normocephalic.     Nose: Nose normal.  Eyes:     General: No scleral icterus.    Conjunctiva/sclera: Conjunctivae normal.  Neck:     Thyroid: No thyromegaly.  Cardiovascular:     Rate and Rhythm: Regular rhythm. Tachycardia present.     Heart sounds: No murmur heard.   No friction rub. No gallop.  Pulmonary:     Breath sounds: No stridor. Wheezing present. No rales.  Chest:     Chest wall: No tenderness.  Abdominal:     General: There is no distension.     Tenderness: There is no abdominal tenderness. There is no rebound.  Musculoskeletal:        General: Normal range of motion.     Cervical back: Neck supple.  Lymphadenopathy:     Cervical: No cervical adenopathy.  Skin:    Findings: No erythema or rash.  Neurological:     Mental Status: She is oriented to person, place, and time.     Motor: No abnormal muscle tone.     Coordination: Coordination normal.  Psychiatric:        Behavior: Behavior normal.    ED Results / Procedures / Treatments   Labs (all labs ordered are listed, but only abnormal results are displayed) Labs Reviewed  CBC WITH DIFFERENTIAL/PLATELET - Abnormal; Notable for the following components:      Result Value   RBC 1.73 (*)    Hemoglobin 5.8 (*)    HCT 18.4 (*)    MCV 106.4 (*)    RDW 19.4 (*)    Platelets 565 (*)    All other components within normal limits  BASIC METABOLIC PANEL - Abnormal; Notable for the following components:   Glucose, Bld 109 (*)    All other components within normal limits  RESP PANEL BY RT-PCR (FLU A&B, COVID) ARPGX2  LACTIC ACID, PLASMA  OCCULT BLOOD X 1 CARD TO LAB, STOOL  BRAIN NATRIURETIC PEPTIDE  POC OCCULT BLOOD, ED  TYPE AND SCREEN  PREPARE RBC (CROSSMATCH)    EKG None  Radiology DG Chest Port 1 View  Result Date: 07/03/2021 CLINICAL DATA:  Shortness of breath EXAM: PORTABLE CHEST 1 VIEW COMPARISON:  11/28/2020 FINDINGS: Increased interstitial  markings bilaterally, without a perihilar distribution. This appearance favors mild infection/pneumonia in this clinical setting, possibly atypical/viral. No definite pleural effusions. No pneumothorax. The heart is top-normal in size.  Thoracic aortic atherosclerosis. IMPRESSION: Increased interstitial markings, favoring mild infection/pneumonia, possibly atypical/viral. Electronically Signed   By: Julian Hy M.D.   On: 07/03/2021 19:57   DG Chest Port 1V same Day  Result Date: 07/03/2021 CLINICAL DATA:  Shortness of breath EXAM: PORTABLE CHEST 1 VIEW COMPARISON:  07/03/2021 FINDINGS: Increased interstitial markings. Biapical pleural parenchymal scarring at the medial lung apices. Small left  pleural effusion. No pneumothorax. Cardiomegaly.  Thoracic aortic atherosclerosis. IMPRESSION: Increased interstitial markings, possibly reflecting mild interstitial edema versus atypical infection/pneumonia. Small left pleural effusion. Electronically Signed   By: Julian Hy M.D.   On: 07/03/2021 23:25    Procedures Procedures    Medications Ordered in ED Medications  0.9 %  sodium chloride infusion (has no administration in time range)  cefTRIAXone (ROCEPHIN) 2 g in sodium chloride 0.9 % 100 mL IVPB (0 g Intravenous Stopped 07/03/21 2222)  azithromycin (ZITHROMAX) 500 mg in sodium chloride 0.9 % 250 mL IVPB (0 mg Intravenous Stopped 07/03/21 2327)  sodium chloride 0.9 % bolus 1,000 mL (1,000 mLs Intravenous New Bag/Given 07/03/21 2142)    ED Course/ Medical Decision Making/ A&P  CRITICAL CARE Performed by: Milton Ferguson Total critical care time: 40 minutes Critical care time was exclusive of separately billable procedures and treating other patients. Critical care was necessary to treat or prevent imminent or life-threatening deterioration. Critical care was time spent personally by me on the following activities: development of treatment plan with patient and/or surrogate as well as  nursing, discussions with consultants, evaluation of patient's response to treatment, examination of patient, obtaining history from patient or surrogate, ordering and performing treatments and interventions, ordering and review of laboratory studies, ordering and review of radiographic studies, pulse oximetry and re-evaluation of patient's condition.                          Medical Decision Making Amount and/or Complexity of Data Reviewed Labs: ordered. Radiology: ordered.  Risk Prescription drug management. Decision regarding hospitalization.   Patient with anemia and possible pneumonia or heart failure.  She will be transfused and started on antibiotics and admitted to medicine    This patient presents to the ED for concern of shortness of breath, this involves an extensive number of treatment options, and is a complaint that carries with it a high risk of complications and morbidity.  The differential diagnosis includes pneumonia MI   Co morbidities that complicate the patient evaluation  COPD dementia   Additional history obtained:  Additional history obtained from daughter External records from outside source obtained and reviewed including hospital records   Lab Tests:  I Ordered, and personally interpreted labs.  The pertinent results include: CBC shows anemia of 5.8   Imaging Studies ordered:  I ordered imaging studies including chest x-ray I independently visualized and interpreted imaging which showed possible pneumonia I agree with the radiologist interpretation   Cardiac Monitoring:  The patient was maintained on a cardiac monitor.  I personally viewed and interpreted the cardiac monitored which showed an underlying rhythm of: Sinus tach   Medicines ordered and prescription drug management:  I ordered medication including antibiotics for pneumonia and transfusion of blood Reevaluation of the patient after these medicines showed that the patient stayed  the same I have reviewed the patients home medicines and have made adjustments as needed   Test Considered:  CT chest   Critical Interventions:  Blood transfusion   Consultations Obtained:  I requested consultation with the hospitalist,  and discussed lab and imaging findings as well as pertinent plan - they recommend: Admission for antibiotics and blood transfusion   Problem List / ED Course:  Anemia possible pneumonia   Reevaluation:  After the interventions noted above, I reevaluated the patient and found that they have :stayed the same   Social Determinants of Health:  Dementia  Dispostion:  After  consideration of the diagnostic results and the patients response to treatment, I feel that the patent would benefit from admission and transfusion of packed blood cells and antibiotics.         Final Clinical Impression(s) / ED Diagnoses Final diagnoses:  Community acquired pneumonia, unspecified laterality  Iron deficiency anemia due to chronic blood loss    Rx / DC Orders ED Discharge Orders     None         Milton Ferguson, MD 07/05/21 1203

## 2021-07-03 NOTE — H&P (Signed)
History and Physical    Patient: Deborah [name] [id number] DOB: Feb 10, 1926 DOA: 07/03/2021 DOS: the patient was seen and examined on 07/04/2021 PCP: Lynnell Catalan, [place]  Patient coming from: SNF  Chief Complaint: Shortness of breath  Chief Complaint  Patient presents with   Shortness of Breath    HPI: Deborah [name] is a 88 y.o. female with medical history significant of  Alzheimer's dementia, coronary artery disease, permanent atrial fibrillation, diastolic CHF, hypertension, hyperlipidemia, COPD and seizure order who presents to the emergency department via EMS due to shortness of breath.  Patient was unable to provide history, history was obtained from ED physician and daughter at bedside.  Per report, patient had shortness of breath this morning and this improved with Xanax, patient had a recurrence of the shortness of breath in the evening, EMS was activated and patient was taken to the ED for cough evaluation and wheezing.  There was no report of fever, chills, chest pain, headache, nausea or vomiting.  ED course: In the emergency department, she was intermittently tachypneic and tachycardic.  O2 sat was 86 - 88% on room air, this improved to 96% on 4 L.  Work-up in the ED showed microcytic anemia with hemoglobin of 5.8, MCV 106.4, thrombocytosis.  BMP was normal BNP 156, lactic acid 1.1, FOBT was negative.  Influenza A, B, SARS coronavirus 2 was negative. Chest x-ray showed increased interstitial markings possibly reflecting mild interstitial edema versus typical infection/pneumonia. Small left pleural effusion. She was started on IV ceftriaxone and azithromycin, Solu-Medrol was given and IV hydration x1 L was given.  Type and screen was drawn and 2 units of PRBCs was ordered to be transfused.  Hospitalist was asked to admit patient for further evaluation and management.  Review of Systems: As mentioned in the history of present illness. All other systems reviewed and are  negative. Past Medical History:  Diagnosis Date   Alzheimer disease ([place])    Asthma    Atrial fibrillation ([place])    Not anticoagulated with history of bleeding and fall risk   Chronic diastolic heart failure (HCC)    COPD (chronic obstructive pulmonary disease) (HCC)    Coronary atherosclerosis of native coronary artery    BMS circumflex 2009   Dementia without behavioral disturbance (HCC)    Drug intolerance    Flecacinide and Amiodarone    Fracture of multiple pubic rami ([place]) 12/13   Left superior and inferior - following fall   Hypotension    Mixed hyperlipidemia    Osteoarthritis    Retroperitoneal hemorrhage 12/13   Following fall   Seizures ([place])    Remote history of over 20 years ago   Umbilical hernia    Past Surgical History:  Procedure Laterality Date   CATARACT EXTRACTION     Radiofrequency catheter ablation     Failed   Right carpel tunnel release     TONSILLECTOMY     Social History:  reports that she quit smoking about 31 years ago. Her smoking use included cigarettes. She has a 20.00 pack-year smoking history. She has never used smokeless tobacco. She reports that she does not drink alcohol and does not use drugs.  Allergies  Allergen Reactions   Ivp Dye [Iodinated Contrast Media] Shortness Of Breath    ??   Omnipaque [Iohexol] Shortness Of Breath and Other (See Comments)    Short of breath with chest tightness after IV injection in CT, pt was fine when she left department but developed symptoms  in parking lot and went to emergency department.   Sulfa Antibiotics Shortness Of Breath   Amiodarone    Carbamazepine     REACTION: toxemia   Dilaudid [Hydromorphone] Nausea And Vomiting   Tramadol Other (See Comments)    confusion    Family History  Problem Relation Age of Onset   Coronary artery disease Mother    Tuberculosis Paternal Grandfather    Heart attack Maternal Grandmother     Prior to Admission medications   Medication Sig Start Date End  Date Taking? Authorizing Provider  ALPRAZolam (XANAX) 0.25 MG tablet Take 1 tablet (0.25 mg total) by mouth every 8 (eight) hours as needed for anxiety. 07/21/19  Yes Tat, Shanon Brow, MD  aspirin (ASPIRIN EC) 81 MG EC tablet Take 1 tablet (81 mg total) by mouth daily. Swallow whole. 05/04/12  Yes Rexene Alberts, MD  D-5000 125 MCG (5000 UT) TABS Take 5,000 Units by mouth daily. 11/11/20  Yes [provider]  digoxin (LANOXIN) 0.125 MG tablet Take 1 tablet (0.125 mg total) by mouth every evening. 09/21/16  Yes Isaac Bliss, Rayford Halsted, MD  diltiazem [facility]) 120 MG 24 hr capsule Take 120 mg by mouth daily. 06/18/20  Yes [provider]  docusate sodium (COLACE) 100 MG capsule Take 100 mg by mouth at bedtime.   Yes [provider]  famotidine (PEPCID) 20 MG tablet Take 1 tablet (20 mg total) by mouth at bedtime. 09/02/20  Yes Barton Dubois, MD  fluticasone [facility]) 50 MCG/ACT nasal spray Place 2 sprays into both nostrils daily.   Yes [provider]  furosemide (LASIX) 20 MG tablet Take 10 mg by mouth daily.   Yes [provider]  isosorbide mononitrate (IMDUR) 30 MG 24 hr tablet Take 0.5 tablets (15 mg total) by mouth daily. 09/03/17  Yes Kathie Dike, MD  levETIRAcetam (KEPPRA) 250 MG tablet Take 125-250 mg by mouth See admin instructions. One tablet at 0900 and half a tablet at 1700   Yes [provider]  levothyroxine (SYNTHROID, LEVOTHROID) 25 MCG tablet Take 25 mcg by mouth daily before breakfast.   Yes [provider]  melatonin 5 MG TABS Take 5 mg by mouth at bedtime.   Yes [provider]  midodrine (PROAMATINE) 2.5 MG tablet Take 1 tablet (2.5 mg total) by mouth 2 (two) times daily with a meal. The dosing can be titrated down to once daily when her systolic blood pressure is consistently in the 100s. 05/04/12  Yes Rexene Alberts, MD  montelukast (SINGULAIR) 10 MG tablet Take 10 mg by mouth daily.  09/13/17  Yes [provider]  Multiple Vitamins-Minerals (ICAPS MV) TABS Take 1 tablet by mouth in the morning and at bedtime.   Yes [provider]  potassium chloride (K-DUR,KLOR-CON) 10 MEQ tablet Take 10 mEq by mouth every other day. 05/27/18  Yes [provider]  sertraline (ZOLOFT) 25 MG tablet Take 25 mg by mouth daily.   Yes [provider]  TRELEGY ELLIPTA 100-62.5-25 MCG/INH AEPB Inhale 1 puff into the lungs daily. 07/07/20  Yes [provider]  oxyCODONE-acetaminophen (PERCOCET) 5-325 MG tablet Take 1 tablet by mouth every 4 (four) hours as needed for severe pain. Patient not taking: Reported on 07/03/2021 10/24/20   Merrily Pew, MD    Physical Exam: Vitals:   07/04/21 0247 07/04/21 0255 07/04/21 0300 07/04/21 0330  BP: 101/73 132/72 106/86 116/64  Pulse: (!) 117 (!) 114 (!) 116 (!) 111  Resp: (!) 23 17 (!)  25 18  Temp: 98.3 F (36.8 C)     TempSrc: Oral     SpO2: 96% 100% 100% 100%   General: Elderly female. Awake, alert and in mild acute distress.  HEENT: NCAT.  PERRLA. EOMI. Sclerae anicteric.  Moist mucosal membranes. Neck: Neck supple without lymphadenopathy. No carotid bruits. No masses palpated.  Cardiovascular: Regular rate with normal S1-S2 sounds. No murmurs, rubs or gallops auscultated. No JVD.  Respiratory: Expiratory wheezing noted around the neck area with diffusely decreased breath sounds positive accessory muscle use. Abdomen: Soft, nontender, nondistended. Active bowel sounds. No masses or hepatosplenomegaly  Skin: Left forearm ecchymosis noted.  No lesions or ulcerations.  Dry and warm to touch. Musculoskeletal:  2+ dorsalis pedis and radial pulses. Good ROM.  No contractures  Psychiatric: Intact judgment and insight.  Mood appropriate to current condition. Neurologic: No focal neurological deficits. Strength is 5/5 x 4.  CN II - XII grossly intact.   Data Reviewed: AV dissociation at a rate of 112 bpm  Assessment and Plan: *  Symptomatic anemia- (present on admission) PRBC transfusion  GERD (gastroesophageal reflux disease) Continue home meds  Hypothyroidism Continue home meds  Acute respiratory failure with hypoxia (HCC) Supplemental oxygen  Thrombocytosis Reactive  Elevated MCV Check B12 and folate level  Elevated brain natriuretic peptide (BNP) level Check echo  Atypical pneumonia Antibiotics  COPD exacerbation (HCC)- (present on admission) Breathing treatment  Hypotension- (present on admission) Midodrine   Symptomatic anemia H/H= 5.8/18.4, this was 12.4/39.0 on 09/01/2020 Hemoccult was negative Type and crossmatch was done 2 units of PRBC was ordered to be transfused You have an IV and Lasix 20 mg x 1 in between the 2 units (if BP permits) Consider outpatient gastroenterology consult on discharge  Acute respiratory failure with hypoxia secondary to multifactorial including symptomatic anemia, atypical pneumonia and COPD exacerbation Continue supplemental oxygen to maintain O2 sats > 92%  Atypical pneumonia Chest x-ray showed increased interstitial markings possibly reflecting mild interstitial edema versus atypical infection/pneumonia Patient was started on ceftriaxone and azithromycin, we shall continue same at this time with plan to de-escalate/discontinue based on blood culture, sputum culture, urine Legionella, strep pneumo and procalcitonin Continue Tylenol as needed Continue Mucinex, incentive spirometry, flutter valve   COPD exacerbation  This was possibly due to pneumonia Continue Xopenex, Mucinex, Solu-Medrol, Singulair, Breo Ellipta azithromycin. Continue Protonix to prevent steroid-induced ulcer Continue incentive spirometry and flutter valve Continue supplemental oxygen to maintain O2 sat > 92% with plan to wean patient off oxygen as tolerated  Elevated MCV-106.4 Folate level and vitamin B12 will be checked Iron studies will be checked  Elevated BNP, rule out  CHF BNP 156 Chest x-ray was suggestive of mild interstitial edema, however, BP is currently soft and Lasix will be held at this time. Continue total input/output, daily weights and fluid restriction Continue Cardiac diet  Echocardiogram done in February 2021 showed LVEF of 60- 65%.  LV has no RWMA.  Echocardiogram will be done in the morning   Chronic hypertension Continue midodrine  Hypothyroidism Continue Synthroid  GERD Continue Protonix  Thrombocytosis possibly reactive Platelets 565, continue to monitor platelet level   Advance Care Planning:   Code Status: DNR   Consults: None  Family Communication: Daughter at bedside (all questions answered to satisfaction)  Severity of Illness: The appropriate patient status for this patient is INPATIENT. Inpatient status is judged to be reasonable and necessary in order to provide the required intensity of service to ensure the patient's safety. The  patient's presenting symptoms, physical exam findings, and initial radiographic and laboratory data in the context of their chronic comorbidities is felt to place them at high risk for further clinical deterioration. Furthermore, it is not anticipated that the patient will be medically stable for discharge from the hospital within 2 midnights of admission.   * I certify that at the point of admission it is my clinical judgment that the patient will require inpatient hospital care spanning beyond 2 midnights from the point of admission due to high intensity of service, high risk for further deterioration and high frequency of surveillance required.*  Author: Bernadette Hoit, DO 07/04/2021 4:29 AM  For on call review [email or web address].

## 2021-07-04 ENCOUNTER — Inpatient Hospital Stay (HOSPITAL_COMMUNITY): Payer: Medicare Other

## 2021-07-04 DIAGNOSIS — D75839 Thrombocytosis, unspecified: Secondary | ICD-10-CM | POA: Insufficient documentation

## 2021-07-04 DIAGNOSIS — R718 Other abnormality of red blood cells: Secondary | ICD-10-CM | POA: Insufficient documentation

## 2021-07-04 DIAGNOSIS — J9601 Acute respiratory failure with hypoxia: Secondary | ICD-10-CM | POA: Diagnosis present

## 2021-07-04 DIAGNOSIS — E039 Hypothyroidism, unspecified: Secondary | ICD-10-CM | POA: Diagnosis present

## 2021-07-04 DIAGNOSIS — F0390 Unspecified dementia without behavioral disturbance: Secondary | ICD-10-CM | POA: Diagnosis present

## 2021-07-04 DIAGNOSIS — I5033 Acute on chronic diastolic (congestive) heart failure: Secondary | ICD-10-CM

## 2021-07-04 DIAGNOSIS — I5021 Acute systolic (congestive) heart failure: Secondary | ICD-10-CM

## 2021-07-04 DIAGNOSIS — D649 Anemia, unspecified: Secondary | ICD-10-CM | POA: Diagnosis present

## 2021-07-04 DIAGNOSIS — J189 Pneumonia, unspecified organism: Secondary | ICD-10-CM

## 2021-07-04 DIAGNOSIS — K219 Gastro-esophageal reflux disease without esophagitis: Secondary | ICD-10-CM | POA: Insufficient documentation

## 2021-07-04 DIAGNOSIS — R7989 Other specified abnormal findings of blood chemistry: Secondary | ICD-10-CM | POA: Insufficient documentation

## 2021-07-04 LAB — CBC
HCT: 28.7 % — ABNORMAL LOW (ref 36.0–46.0)
HCT: 30.1 % — ABNORMAL LOW (ref 36.0–46.0)
Hemoglobin: 9.2 g/dL — ABNORMAL LOW (ref 12.0–15.0)
Hemoglobin: 9.9 g/dL — ABNORMAL LOW (ref 12.0–15.0)
MCH: 31.5 pg (ref 26.0–34.0)
MCH: 32.2 pg (ref 26.0–34.0)
MCHC: 32.1 g/dL (ref 30.0–36.0)
MCHC: 32.9 g/dL (ref 30.0–36.0)
MCV: 98 fL (ref 80.0–100.0)
MCV: 98.3 fL (ref 80.0–100.0)
Platelets: 635 10*3/uL — ABNORMAL HIGH (ref 150–400)
Platelets: 647 10*3/uL — ABNORMAL HIGH (ref 150–400)
RBC: 2.92 MIL/uL — ABNORMAL LOW (ref 3.87–5.11)
RBC: 3.07 MIL/uL — ABNORMAL LOW (ref 3.87–5.11)
RDW: 21 % — ABNORMAL HIGH (ref 11.5–15.5)
RDW: 21.1 % — ABNORMAL HIGH (ref 11.5–15.5)
WBC: 5.7 10*3/uL (ref 4.0–10.5)
WBC: 5.8 10*3/uL (ref 4.0–10.5)
nRBC: 0 % (ref 0.0–0.2)
nRBC: 0.3 % — ABNORMAL HIGH (ref 0.0–0.2)

## 2021-07-04 LAB — RESP PANEL BY RT-PCR (FLU A&B, COVID) ARPGX2
Influenza A by PCR: NEGATIVE
Influenza B by PCR: NEGATIVE
SARS Coronavirus 2 by RT PCR: NEGATIVE

## 2021-07-04 LAB — ECHOCARDIOGRAM COMPLETE
AR max vel: 1.61 cm2
AV Area VTI: 0.95 cm2
AV Area mean vel: 1.02 cm2
AV Mean grad: 19.2 mmHg
AV Peak grad: 34.7 mmHg
Ao pk vel: 2.94 m/s
Area-P 1/2: 5.23 cm2
Calc EF: 73.1 %
S' Lateral: 3.2 cm
Single Plane A2C EF: 65.8 %
Single Plane A4C EF: 79 %
Weight: 2363.33 oz

## 2021-07-04 LAB — BRAIN NATRIURETIC PEPTIDE: B Natriuretic Peptide: 156 pg/mL — ABNORMAL HIGH (ref 0.0–100.0)

## 2021-07-04 LAB — MAGNESIUM
Magnesium: 2.3 mg/dL (ref 1.7–2.4)
Magnesium: 2.3 mg/dL (ref 1.7–2.4)

## 2021-07-04 LAB — COMPREHENSIVE METABOLIC PANEL
ALT: 20 U/L (ref 0–44)
AST: 28 U/L (ref 15–41)
Albumin: 3.7 g/dL (ref 3.5–5.0)
Alkaline Phosphatase: 98 U/L (ref 38–126)
Anion gap: 8 (ref 5–15)
BUN: 21 mg/dL (ref 8–23)
CO2: 28 mmol/L (ref 22–32)
Calcium: 8.3 mg/dL — ABNORMAL LOW (ref 8.9–10.3)
Chloride: 100 mmol/L (ref 98–111)
Creatinine, Ser: 0.75 mg/dL (ref 0.44–1.00)
GFR, Estimated: >60 mL/min (ref >60)
Glucose, Bld: 134 mg/dL — ABNORMAL HIGH (ref 70–99)
Potassium: 4.4 mmol/L (ref 3.5–5.1)
Sodium: 136 mmol/L (ref 135–145)
Total Bilirubin: 0.7 mg/dL (ref 0.3–1.2)
Total Protein: 6.7 g/dL (ref 6.5–8.1)

## 2021-07-04 LAB — APTT: aPTT: 33 seconds (ref 24–36)

## 2021-07-04 LAB — IRON AND TIBC
Iron: 248 ug/dL — ABNORMAL HIGH (ref 28–170)
Saturation Ratios: 60 % — ABNORMAL HIGH (ref 10.4–31.8)
TIBC: 414 ug/dL (ref 250–450)
UIBC: 166 ug/dL

## 2021-07-04 LAB — PROCALCITONIN: Procalcitonin: <0.1 ng/mL

## 2021-07-04 LAB — STREP PNEUMONIAE URINARY ANTIGEN: Strep Pneumo Urinary Antigen: NEGATIVE

## 2021-07-04 LAB — PHOSPHORUS: Phosphorus: 4.2 mg/dL (ref 2.5–4.6)

## 2021-07-04 LAB — VITAMIN B12: Vitamin B-12: 1532 pg/mL — ABNORMAL HIGH (ref 180–914)

## 2021-07-04 LAB — FOLATE: Folate: 23.9 ng/mL (ref >5.9)

## 2021-07-04 LAB — FERRITIN: Ferritin: 214 ng/mL (ref 11–307)

## 2021-07-04 MED ORDER — BUDESONIDE 0.5 MG/2ML IN SUSP
0.5000 mg | Freq: Two times a day (BID) | RESPIRATORY_TRACT | Status: DC
  Administered 2021-07-04 – 2021-07-07 (×6): 0.5 mg via RESPIRATORY_TRACT
  Filled 2021-07-04 (×6): qty 2

## 2021-07-04 MED ORDER — METHYLPREDNISOLONE SODIUM SUCC 40 MG IJ SOLR
40.0000 mg | Freq: Two times a day (BID) | INTRAMUSCULAR | Status: DC
  Administered 2021-07-04: 40 mg via INTRAVENOUS
  Filled 2021-07-04: qty 1

## 2021-07-04 MED ORDER — CHLORHEXIDINE GLUCONATE CLOTH 2 % EX PADS
6.0000 | MEDICATED_PAD | Freq: Every day | CUTANEOUS | Status: DC
  Administered 2021-07-04 – 2021-07-07 (×3): 6 via TOPICAL

## 2021-07-04 MED ORDER — FLUTICASONE FUROATE-VILANTEROL 100-25 MCG/ACT IN AEPB
1.0000 | INHALATION_SPRAY | Freq: Every day | RESPIRATORY_TRACT | Status: DC
  Filled 2021-07-04: qty 28

## 2021-07-04 MED ORDER — ACETAMINOPHEN 325 MG PO TABS: 650.0000 mg | ORAL_TABLET | Freq: Four times a day (QID) | ORAL | Status: DC | PRN

## 2021-07-04 MED ORDER — SODIUM CHLORIDE 0.9 % IV SOLN
2.0000 g | INTRAVENOUS | Status: DC
  Administered 2021-07-04: 2 g via INTRAVENOUS
  Filled 2021-07-04: qty 20

## 2021-07-04 MED ORDER — ACETAMINOPHEN 650 MG RE SUPP: 650.0000 mg | Freq: Four times a day (QID) | RECTAL | Status: DC | PRN

## 2021-07-04 MED ORDER — PERFLUTREN LIPID MICROSPHERE
1.0000 mL | INTRAVENOUS | Status: AC | PRN
  Administered 2021-07-04: 2 mL via INTRAVENOUS

## 2021-07-04 MED ORDER — METHYLPREDNISOLONE SODIUM SUCC 125 MG IJ SOLR: 80.0000 mg | Freq: Every day | INTRAMUSCULAR | Status: DC

## 2021-07-04 MED ORDER — LEVOTHYROXINE SODIUM 25 MCG PO TABS
25.0000 ug | ORAL_TABLET | Freq: Every day | ORAL | Status: DC
  Administered 2021-07-04 – 2021-07-07 (×3): 25 ug via ORAL
  Filled 2021-07-04 (×3): qty 1

## 2021-07-04 MED ORDER — METHYLPREDNISOLONE SODIUM SUCC 125 MG IJ SOLR
80.0000 mg | Freq: Once | INTRAMUSCULAR | Status: AC
  Administered 2021-07-04: 80 mg via INTRAVENOUS
  Filled 2021-07-04: qty 2

## 2021-07-04 MED ORDER — FUROSEMIDE 10 MG/ML IJ SOLN
40.0000 mg | Freq: Every day | INTRAMUSCULAR | Status: DC
  Administered 2021-07-04 – 2021-07-07 (×4): 40 mg via INTRAVENOUS
  Filled 2021-07-04 (×4): qty 4

## 2021-07-04 MED ORDER — SODIUM CHLORIDE 0.9% IV SOLUTION: Freq: Once | INTRAVENOUS | Status: AC

## 2021-07-04 MED ORDER — DM-GUAIFENESIN ER 30-600 MG PO TB12
1.0000 | ORAL_TABLET | Freq: Two times a day (BID) | ORAL | Status: DC
  Administered 2021-07-04 – 2021-07-07 (×7): 1 via ORAL
  Filled 2021-07-04 (×11): qty 1

## 2021-07-04 MED ORDER — PANTOPRAZOLE SODIUM 40 MG PO TBEC
40.0000 mg | DELAYED_RELEASE_TABLET | Freq: Every day | ORAL | Status: DC
  Administered 2021-07-04 – 2021-07-07 (×4): 40 mg via ORAL
  Filled 2021-07-04 (×4): qty 1

## 2021-07-04 MED ORDER — MIDODRINE HCL 5 MG PO TABS
2.5000 mg | ORAL_TABLET | Freq: Two times a day (BID) | ORAL | Status: DC
  Administered 2021-07-04 – 2021-07-07 (×7): 2.5 mg via ORAL
  Filled 2021-07-04 (×8): qty 1

## 2021-07-04 MED ORDER — MONTELUKAST SODIUM 10 MG PO TABS
10.0000 mg | ORAL_TABLET | Freq: Every day | ORAL | Status: DC
  Administered 2021-07-04 – 2021-07-07 (×4): 10 mg via ORAL
  Filled 2021-07-04 (×4): qty 1

## 2021-07-04 MED ORDER — GUAIFENESIN-DM 100-10 MG/5ML PO SYRP
5.0000 mL | ORAL_SOLUTION | ORAL | Status: DC | PRN
  Filled 2021-07-04: qty 5

## 2021-07-04 MED ORDER — MORPHINE SULFATE (PF) 2 MG/ML IV SOLN
1.0000 mg | INTRAVENOUS | Status: DC | PRN
  Administered 2021-07-04 – 2021-07-07 (×6): 1 mg via INTRAVENOUS
  Filled 2021-07-04 (×6): qty 1

## 2021-07-04 MED ORDER — FUROSEMIDE 10 MG/ML IJ SOLN
20.0000 mg | Freq: Once | INTRAMUSCULAR | Status: AC
  Administered 2021-07-04: 20 mg via INTRAVENOUS
  Filled 2021-07-04: qty 2

## 2021-07-04 MED ORDER — SODIUM CHLORIDE 0.9 % IV SOLN
500.0000 mg | INTRAVENOUS | Status: DC
  Administered 2021-07-04: 500 mg via INTRAVENOUS
  Filled 2021-07-04: qty 5

## 2021-07-04 MED ORDER — IPRATROPIUM BROMIDE 0.02 % IN SOLN
0.5000 mg | Freq: Four times a day (QID) | RESPIRATORY_TRACT | Status: DC
  Administered 2021-07-04 (×3): 0.5 mg via RESPIRATORY_TRACT
  Filled 2021-07-04 (×3): qty 2.5

## 2021-07-04 MED ORDER — LEVALBUTEROL HCL 0.63 MG/3ML IN NEBU
0.6300 mg | INHALATION_SOLUTION | Freq: Four times a day (QID) | RESPIRATORY_TRACT | Status: DC
  Administered 2021-07-04 (×4): 0.63 mg via RESPIRATORY_TRACT
  Filled 2021-07-04 (×4): qty 3

## 2021-07-04 MED ORDER — IPRATROPIUM BROMIDE 0.02 % IN SOLN
RESPIRATORY_TRACT | Status: AC
  Administered 2021-07-04: 0.5 mg
  Filled 2021-07-04: qty 2.5

## 2021-07-04 MED ORDER — LEVALBUTEROL HCL 0.63 MG/3ML IN NEBU
0.6300 mg | INHALATION_SOLUTION | RESPIRATORY_TRACT | Status: DC | PRN
  Administered 2021-07-04: 0.63 mg via RESPIRATORY_TRACT
  Filled 2021-07-04 (×2): qty 3

## 2021-07-04 NOTE — Assessment & Plan Note (Deleted)
Check echo 

## 2021-07-04 NOTE — Assessment & Plan Note (Addendum)
Continue synthroid.

## 2021-07-04 NOTE — Assessment & Plan Note (Deleted)
Supplemental oxygen

## 2021-07-04 NOTE — Assessment & Plan Note (Deleted)
Baseline Hgb 12 Presented with Hgb 5.8 2 units PRBC ordered

## 2021-07-04 NOTE — Assessment & Plan Note (Deleted)
Antibiotics

## 2021-07-04 NOTE — Progress Notes (Signed)
PROGRESS NOTE  Deborah [name] [id number] DOB: [birth year] DOA: 07/03/2021 PCP: Lynnell Catalan, FNP  Brief History:  88 year old female with a history of Alzheimer's dementia, coronary disease, permanent atrial fibrillation, diastolic CHF, hypertension, hyperlipidemia, COPD, seizure order presenting with shortness of breath that began on the morning of 07/03/2020.  Patient was given some Xanax with some improvement.  However she redeveloped shortness of breath again on the evening of 07/03/2021.  As result, the patient was brought to emergency department for further evaluation.  There is no history of nausea, vomiting, diarrhea, abdominal pain.  There is coughing, chest congestion, and wheezing.  However the duration of the symptoms is not clear even after speaking with the patient's daughter.  The patient is a poor historian secondary to her Alzheimer's dementia.  History is from review of the medical record and speaking with the patient's daughter.  The patient resides at [facility].  At baseline, the patient is pleasantly confused and independent with her ADLs including dressing, bathing, and going to the bathroom     Assessment and Plan: * Acute respiratory failure with hypoxia ([place])- (present on admission) Presented with saturation 86% RA and tachypnea Stable on 4L>>2L Due to CHF and possible  COPD exacerbation Wean oxygen for saturation >92% Check PCT  Symptomatic anemia- (present on admission) Baseline Hgb 12 Presented with Hgb 5.8 2 units PRBC ordered Consult GI even though FOBT negative as the patient had hemoglobin 12.4 back in April 12, [id number] Serum 123456 Folic acid  Permanent atrial fibrillation ([place])- (present on admission) Patient had RVR at the time of admission Now rate controlled Temporarily holding apixaban due to drop in hemoglobin Continue digoxin  Major neurocognitive disorder ([place])- (present on admission) She is at risk for hospital  delirium  Hypothyroidism- (present on admission) Continue synthroid  COPD exacerbation ([place])- (present on admission) Start Xopenex and Atrovent Start Pulmicort  Hypotension Midodrine  Acute on chronic diastolic CHF (congestive heart failure) ([place]) Patient appears fluid overloaded clinically Start IV furosemide Daily weights Accurate I's and O's Repeat echocardiogram         Status is: Inpatient Remains inpatient appropriate because: Remains fluid overloaded requiring IV furosemide.  New oxygen requirement            Family Communication: Daughter updated 2/12  Consultants: GI  Code Status:  DNR  DVT Prophylaxis:  SCDs   Procedures: As Listed in Progress Note Above  Antibiotics: Ceftriaxone 2/11>> Azithro 2/11>>     Subjective: Patient is breathing a little better.  She denies any shortness of breath, chest pain, abdominal pain, nausea, vomiting.  There is no hematochezia or melena.  Objective: Vitals:   07/04/21 0700 07/04/21 0741 07/04/21 0800 07/04/21 0926  BP: (!) 165/85  (!) 145/34   Pulse: 77 95 60 (!) 30  Resp: 19 20 (!) 21 (!) 22  Temp:  (!) 97.5 F (36.4 C)  97.8 F (36.6 C)  TempSrc:  Oral  Oral  SpO2: 98% 94% 99% (!) 70%  Weight:        Intake/Output Summary (Last 24 hours) at 07/04/2021 1019 Last data filed at 07/04/2021 [id number] Gross per 24 hour  Intake 2327 ml  Output --  Net 2327 ml   Weight change:  Exam:  General:  Pt is alert, follows commands appropriately, not in acute distress HEENT: No icterus, No thrush, No neck mass, /AT Cardiovascular: IRRR, S1/S2, no rubs, no gallops Respiratory: Bibasilar crackles.  Upper airway  wheeze. Abdomen: Soft/+BS, non tender, non distended, no guarding Extremities: 1 + LE edema, No lymphangitis, No petechiae, No rashes, no synovitis   Data Reviewed: I have personally reviewed following labs and imaging studies Basic Metabolic Panel: Recent Labs  Lab 07/03/21 2137  NA 136   K 4.3  CL 102  CO2 27  GLUCOSE 109*  BUN 21  CREATININE 0.73  CALCIUM 8.9   Liver Function Tests: No results for input(s): AST, ALT, ALKPHOS, BILITOT, PROT, ALBUMIN in the last 168 hours. No results for input(s): LIPASE, AMYLASE in the last 168 hours. No results for input(s): AMMONIA in the last 168 hours. Coagulation Profile: No results for input(s): INR, PROTIME in the last 168 hours. CBC: Recent Labs  Lab 07/03/21 2137  WBC 4.2  NEUTROABS 3.3  HGB 5.8*  HCT 18.4*  MCV 106.4*  PLT 565*   Cardiac Enzymes: No results for input(s): CKTOTAL, CKMB, CKMBINDEX, TROPONINI in the last 168 hours. BNP: Invalid input(s): POCBNP CBG: No results for input(s): GLUCAP in the last 168 hours. HbA1C: No results for input(s): HGBA1C in the last 72 hours. Urine analysis:    Component Value Date/Time   COLORURINE YELLOW 10/24/2020 0520   APPEARANCEUR HAZY (A) 10/24/2020 0520   LABSPEC 1.018 10/24/2020 0520   PHURINE 6.0 10/24/2020 0520   GLUCOSEU NEGATIVE 10/24/2020 0520   HGBUR NEGATIVE 10/24/2020 0520   BILIRUBINUR NEGATIVE 10/24/2020 0520   KETONESUR NEGATIVE 10/24/2020 0520   PROTEINUR NEGATIVE 10/24/2020 0520   UROBILINOGEN 0.2 04/28/2012 1830   NITRITE POSITIVE (A) 10/24/2020 0520   LEUKOCYTESUR LARGE (A) 10/24/2020 0520   Sepsis Labs: @LABRCNTIP (procalcitonin:4,lacticidven:4) ) Recent Results (from the past 240 hour(s))  Culture, blood (routine x 2) Call MD if unable to obtain prior to antibiotics being given     Status: None (Preliminary result)   Collection Time: 07/03/21 10:54 PM   Specimen: Left Antecubital; Blood  Result Value Ref Range Status   Specimen Description LEFT ANTECUBITAL  Final   Special Requests   Final    BOTTLES DRAWN AEROBIC ONLY Blood Culture adequate volume   Culture   Final    NO GROWTH < 12 HOURS Performed at [facility], [address], [place], [place] [address]    Report Status PENDING  Incomplete  Resp Panel by RT-PCR (Flu A&B, Covid)  Nasopharyngeal Swab     Status: None   Collection Time: 07/03/21 11:16 PM   Specimen: Nasopharyngeal Swab; Nasopharyngeal(NP) swabs in vial transport medium  Result Value Ref Range Status   SARS Coronavirus 2 by RT PCR NEGATIVE NEGATIVE Final    Comment: (NOTE) SARS-CoV-2 target nucleic acids are NOT DETECTED.  The SARS-CoV-2 RNA is generally detectable in upper respiratory specimens during the acute phase of infection. The lowest concentration of SARS-CoV-2 viral copies this assay can detect is 138 copies/mL. A negative result does not preclude SARS-Cov-2 infection and should not be used as the sole basis for treatment or other patient management decisions. A negative result may occur with  improper specimen collection/handling, submission of specimen other than nasopharyngeal swab, presence of viral mutation(s) within the areas targeted by this assay, and inadequate number of viral copies(<138 copies/mL). A negative result must be combined with clinical observations, patient history, and epidemiological information. The expected result is Negative.  Fact Sheet for Patients:  EntrepreneurPulse.com.au  Fact Sheet for Healthcare Providers:  IncredibleEmployment.be  This test is no t yet approved or cleared by the [place] FDA and  has been authorized for detection  and/or diagnosis of SARS-CoV-2 by FDA under an Emergency Use Authorization (EUA). This EUA will remain  in effect (meaning this test can be used) for the duration of the COVID-19 declaration under Section 564(b)(1) of the Act, 21 U.S.C.section 360bbb-3(b)(1), unless the authorization is terminated  or revoked sooner.       Influenza A by PCR NEGATIVE NEGATIVE Final   Influenza B by PCR NEGATIVE NEGATIVE Final    Comment: (NOTE) The Xpert Xpress SARS-CoV-2/FLU/RSV plus assay is intended as an aid in the diagnosis of influenza from Nasopharyngeal swab specimens and should not be  used as a sole basis for treatment. Nasal washings and aspirates are unacceptable for Xpert Xpress SARS-CoV-2/FLU/RSV testing.  Fact Sheet for Patients: EntrepreneurPulse.com.au  Fact Sheet for Healthcare Providers: IncredibleEmployment.be  This test is not yet approved or cleared by the [place] FDA and has been authorized for detection and/or diagnosis of SARS-CoV-2 by FDA under an Emergency Use Authorization (EUA). This EUA will remain in effect (meaning this test can be used) for the duration of the COVID-19 declaration under Section 564(b)(1) of the Act, 21 U.S.C. section 360bbb-3(b)(1), unless the authorization is terminated or revoked.  Performed at [facility], [address], [place], [place] [address]      Scheduled Meds:  budesonide (PULMICORT) nebulizer solution  0.5 mg Nebulization BID   Chlorhexidine Gluconate Cloth  6 each Topical Q0600   dextromethorphan-guaiFENesin  1 tablet Oral BID   ipratropium  0.5 mg Nebulization Q6H   levalbuterol  0.63 mg Nebulization Q6H   levothyroxine  25 mcg Oral Q0600   [START ON 07/05/2021] methylPREDNISolone (SOLU-MEDROL) injection  80 mg Intravenous Daily   midodrine  2.5 mg Oral BID WC   montelukast  10 mg Oral Daily   pantoprazole  40 mg Oral Daily   Continuous Infusions:  sodium chloride     azithromycin     cefTRIAXone (ROCEPHIN)  IV      Procedures/Studies: CT HEAD WO CONTRAST (5MM)  Result Date: 06/23/2021 CLINICAL DATA:  Unwitnessed fall. EXAM: CT HEAD WITHOUT CONTRAST CT CERVICAL SPINE WITHOUT CONTRAST TECHNIQUE: Multidetector CT imaging of the head and cervical spine was performed following the standard protocol without intravenous contrast. Multiplanar CT image reconstructions of the cervical spine were also generated. RADIATION DOSE REDUCTION: This exam was performed according to the departmental dose-optimization program which includes automated exposure control, adjustment of  the mA and/or kV according to patient size and/or use of iterative reconstruction technique. COMPARISON:  None. FINDINGS: CT HEAD FINDINGS Brain: No evidence of acute infarction, hemorrhage, hydrocephalus, extra-axial collection or mass lesion/mass effect. Again seen is moderate diffuse atrophy and mild periventricular white matter hypodensity, likely chronic small vessel ischemic change. Vascular: No hyperdense vessel or unexpected calcification. Skull: Normal. Negative for fracture or focal lesion. Sinuses/Orbits: No acute finding. Other: None. CT CERVICAL SPINE FINDINGS Alignment: Normal. Skull base and vertebrae: No acute fracture. No primary bone lesion or focal pathologic process. Bones are osteopenic. Soft tissues and spinal canal: No prevertebral fluid or swelling. No visible canal hematoma. Disc levels: There is intervertebral disc space narrowing and endplate osteophyte formation at C4-C5 and C6-C7 compatible with degenerative change. No significant central canal or neural foraminal stenosis at any level. Upper chest: Negative. Other: Examination is mildly technically limited secondary to motion artifact. IMPRESSION: 1.  No acute intracranial process. 2. No acute fracture or traumatic subluxation of the cervical spine. Examination is mildly technically limited secondary to motion artifact. 3. Stable moderate diffuse brain atrophy  and mild chronic small vessel ischemic change. Electronically Signed   By: Ronney Asters M.D.   On: 06/23/2021 00:45   CT CERVICAL SPINE WO CONTRAST  Result Date: 06/23/2021 CLINICAL DATA:  Unwitnessed fall. EXAM: CT HEAD WITHOUT CONTRAST CT CERVICAL SPINE WITHOUT CONTRAST TECHNIQUE: Multidetector CT imaging of the head and cervical spine was performed following the standard protocol without intravenous contrast. Multiplanar CT image reconstructions of the cervical spine were also generated. RADIATION DOSE REDUCTION: This exam was performed according to the departmental  dose-optimization program which includes automated exposure control, adjustment of the mA and/or kV according to patient size and/or use of iterative reconstruction technique. COMPARISON:  None. FINDINGS: CT HEAD FINDINGS Brain: No evidence of acute infarction, hemorrhage, hydrocephalus, extra-axial collection or mass lesion/mass effect. Again seen is moderate diffuse atrophy and mild periventricular white matter hypodensity, likely chronic small vessel ischemic change. Vascular: No hyperdense vessel or unexpected calcification. Skull: Normal. Negative for fracture or focal lesion. Sinuses/Orbits: No acute finding. Other: None. CT CERVICAL SPINE FINDINGS Alignment: Normal. Skull base and vertebrae: No acute fracture. No primary bone lesion or focal pathologic process. Bones are osteopenic. Soft tissues and spinal canal: No prevertebral fluid or swelling. No visible canal hematoma. Disc levels: There is intervertebral disc space narrowing and endplate osteophyte formation at C4-C5 and C6-C7 compatible with degenerative change. No significant central canal or neural foraminal stenosis at any level. Upper chest: Negative. Other: Examination is mildly technically limited secondary to motion artifact. IMPRESSION: 1.  No acute intracranial process. 2. No acute fracture or traumatic subluxation of the cervical spine. Examination is mildly technically limited secondary to motion artifact. 3. Stable moderate diffuse brain atrophy and mild chronic small vessel ischemic change. Electronically Signed   By: Ronney Asters M.D.   On: 06/23/2021 00:45   DG Chest Port 1 View  Result Date: 07/03/2021 CLINICAL DATA:  Shortness of breath EXAM: PORTABLE CHEST 1 VIEW COMPARISON:  11/28/2020 FINDINGS: Increased interstitial markings bilaterally, without a perihilar distribution. This appearance favors mild infection/pneumonia in this clinical setting, possibly atypical/viral. No definite pleural effusions. No pneumothorax. The heart  is top-normal in size.  Thoracic aortic atherosclerosis. IMPRESSION: Increased interstitial markings, favoring mild infection/pneumonia, possibly atypical/viral. Electronically Signed   By: Julian Hy M.D.   On: 07/03/2021 19:57   DG Chest Port 1V same Day  Result Date: 07/03/2021 CLINICAL DATA:  Shortness of breath EXAM: PORTABLE CHEST 1 VIEW COMPARISON:  07/03/2021 FINDINGS: Increased interstitial markings. Biapical pleural parenchymal scarring at the medial lung apices. Small left pleural effusion. No pneumothorax. Cardiomegaly.  Thoracic aortic atherosclerosis. IMPRESSION: Increased interstitial markings, possibly reflecting mild interstitial edema versus atypical infection/pneumonia. Small left pleural effusion. Electronically Signed   By: Julian Hy M.D.   On: 07/03/2021 23:25    Orson Eva, DO  [facility]  If 7PM-7AM, please contact night-coverage [email or web address] Password [facility] 07/04/2021, 10:19 AM   LOS: 1 day

## 2021-07-04 NOTE — Hospital Course (Addendum)
88 year old female with a history of Alzheimer's dementia, coronary disease, permanent atrial fibrillation, diastolic CHF, hypertension, hyperlipidemia, COPD, seizure order presenting with shortness of breath that began on the morning of 07/03/2020.  Patient was given some Xanax with some improvement.  However she redeveloped shortness of breath again on the evening of 07/03/2021.  As result, the patient was brought to emergency department for further evaluation. The patient is a poor historian secondary to her Alzheimer's dementia.  History is from review of the medical record and speaking with the patient's daughter.  The patient resides at [facility].  At baseline, the patient is pleasantly confused and requires minimal assist with her ADLs including dressing, bathing, and going to the bathroom In speaking with the patient's daughter, it appears that the patient has had some shortness of breath for 2 to 3 days prior to admission.  There is no history of nausea, vomiting, diarrhea, abdominal pain.  There have not been any reports of hematochezia or melena.  However, the patient has been having coughing, chest congestion, and wheezing.  The patient has not been started any new medications recently. In the emergency department, the patient was noted to be hypoxic with oxygen saturation 86-80% on room air.  She was afebrile with heart rates 110-120.  She was placed on 4 L nasal cannula with saturation up to 100%.  WBC 4.2, hemoglobin 5.8, platelets vomiting 65,000.  BMP shows sodium 136, potassium 4.3, bicarbonate 27, BUN 21, creatinine 0.73.  GI was consulted for her drop in Hgb and may consider doing EGD on 2/16 if respiratory status is stable

## 2021-07-04 NOTE — Assessment & Plan Note (Addendum)
Patient had RVR at the time of admission Now rate controlled Continue digoxin and diltiazem Would continue to hold apixiban for now until patient can have GI tract evaluated, since there was concern for GI bleeding

## 2021-07-04 NOTE — Assessment & Plan Note (Addendum)
Presented with saturation 86% RA and tachypnea Stable on 4L>>2L>>1.5>>RA Due to CHF  Wean oxygen for saturation >92% Check PCT <0.10 She is now has stable oxygen saturations on room air, at rest and on exertion

## 2021-07-04 NOTE — Assessment & Plan Note (Signed)
She is at risk for hospital delirium

## 2021-07-04 NOTE — ED Notes (Addendum)
Waiting to give blood until breathing treatment is given and there is an improvement. These order were given per hospitalist.

## 2021-07-04 NOTE — Assessment & Plan Note (Addendum)
Treated with solumedrol, now transitioned to prednisone taper She was treated with bronchodilators Resume trelegy on discharge

## 2021-07-04 NOTE — Consult Note (Signed)
Maylon Peppers, M.D. Gastroenterology & Hepatology                                           Patient Name: Deborah [name] Account #: '@[id number]' @   MRN: [id number] Admission Date: 07/03/2021 Date of Evaluation:  07/04/2021 Time of Evaluation: 10:34 AM  Referring Physician: Orson Eva, MD  Chief Complaint: Anemia  HPI:  This is a 88 y.o. female with multiple medical conditions including Alzheimer disease, asthma, atrial fibrillation on Eliquis, chronic diastolic heart failure, COPD, coronary artery disease status stent placement in 2009, seizures, hyperlipidemia and osteoarthritis, who came to the hospital after presenting worsening shortness of breath.  Gastroenterology was consulted for evaluation of anemia.  The patient was brought to the hospital from [place].  The patient is a poor historian given her acute mental status changes.  Daughter is at the bedside and provides most of the history, rest of the history is obtained from the medical chart.  Patient presented some worsening shortness of breath on 07/03/2021 during the morning time.  She also presented some episodes of worsening cough, wheezing and sputum production.  Due to worsening of her respiratory status she was brought to the ER.  She did not have the symptoms in the past.  The daughter reports that even the she has Alzheimer's, she is independent in her ADLs.  Notably, there is no report of melena or hematochezia, abdominal pain, nausea, vomiting or coffee-ground emesis/hematemesis.  Patient took her last Eliquis dose yesterday in the morning.  Daughter states that her last colonoscopy and EGD were close to 20 years ago.  She does not know the results of this testing.  In the ED, she was tachycardic up to 114 bpm but otherwise hemodynamically stable and afebrile. Labs were remarkable for hemoglobin of 5.8 (baseline of 12.4 on 09/01/2020), MCV of 106.4, white blood cell count was 4.2, and platelets were 565.  BMP showed  a normal BUN of 21 and creatinine of 0.73.  Chest x-ray showed presence of increased interstitial markings favoring possible pneumonia. FOBT was negative.  Patient was transferred to the ICU and was started on antibiotics.  She was initially on 4 L/min via nasal cannula, currently she is on 2.5 L per nasal cannula.  Past Medical History: SEE CHRONIC ISSSUES: Past Medical History:  Diagnosis Date   Alzheimer disease ([place])    Asthma    Atrial fibrillation ([place])    Not anticoagulated with history of bleeding and fall risk   Chronic diastolic heart failure (H[facility])    COPD (chronic obstructive pulmonary disease) (H[facility])    Coronary atherosclerosis of native coronary artery    BMS circumflex 2009   Dementia without behavioral disturbance (HCC)    Drug intolerance    Flecacinide and Amiodarone    Fracture of multiple pubic rami ([place]) 12/13   Left superior and inferior - following fall   Hypotension    Mixed hyperlipidemia    Osteoarthritis    Retroperitoneal hemorrhage 12/13   Following fall   Seizures ([place])    Remote history of over 20 years ago   Umbilical hernia    Past Surgical History:  Past Surgical History:  Procedure Laterality Date   CATARACT EXTRACTION     Radiofrequency catheter ablation     Failed   Right carpel tunnel release     TONSILLECTOMY  Family History:  Family History  Problem Relation Age of Onset   Coronary artery disease Mother    Tuberculosis Paternal Grandfather    Heart attack Maternal Grandmother    Social History:  Social History   Tobacco Use   Smoking status: Former    Packs/day: 0.80    Years: 25.00    Pack years: 20.00    Types: Cigarettes    Quit date: 05/23/1990    Years since quitting: 31.1   Smokeless tobacco: Never  Vaping Use   Vaping Use: Never used  Substance Use Topics   Alcohol use: No    Alcohol/week: 0.0 standard drinks   Drug use: No    Home Medications:  Prior to Admission medications   Medication Sig Start Date  End Date Taking? Authorizing Provider  ALPRAZolam (XANAX) 0.25 MG tablet Take 1 tablet (0.25 mg total) by mouth every 8 (eight) hours as needed for anxiety. 07/21/19  Yes Tat, Shanon Brow, MD  aspirin (ASPIRIN EC) 81 MG EC tablet Take 1 tablet (81 mg total) by mouth daily. Swallow whole. 05/04/12  Yes Rexene Alberts, MD  D-5000 125 MCG (5000 UT) TABS Take 5,000 Units by mouth daily. 11/11/20  Yes [provider]  digoxin (LANOXIN) 0.125 MG tablet Take 1 tablet (0.125 mg total) by mouth every evening. 09/21/16  Yes Isaac Bliss, Rayford Halsted, MD  diltiazem [facility]) 120 MG 24 hr capsule Take 120 mg by mouth daily. 06/18/20  Yes [provider]  docusate sodium (COLACE) 100 MG capsule Take 100 mg by mouth at bedtime.   Yes [provider]  famotidine (PEPCID) 20 MG tablet Take 1 tablet (20 mg total) by mouth at bedtime. 09/02/20  Yes Barton Dubois, MD  fluticasone [facility]) 50 MCG/ACT nasal spray Place 2 sprays into both nostrils daily.   Yes [provider]  furosemide (LASIX) 20 MG tablet Take 10 mg by mouth daily.   Yes [provider]  isosorbide mononitrate (IMDUR) 30 MG 24 hr tablet Take 0.5 tablets (15 mg total) by mouth daily. 09/03/17  Yes Kathie Dike, MD  levETIRAcetam (KEPPRA) 250 MG tablet Take 125-250 mg by mouth See admin instructions. One tablet at 0900 and half a tablet at 1700   Yes [provider]  levothyroxine (SYNTHROID, LEVOTHROID) 25 MCG tablet Take 25 mcg by mouth daily before breakfast.   Yes [provider]  melatonin 5 MG TABS Take 5 mg by mouth at bedtime.   Yes [provider]  midodrine (PROAMATINE) 2.5 MG tablet Take 1 tablet (2.5 mg total) by mouth 2 (two) times daily with a meal. The dosing can be titrated down to once daily when her systolic blood pressure is consistently in the 100s. 05/04/12  Yes Rexene Alberts, MD  montelukast (SINGULAIR) 10 MG tablet Take 10 mg by mouth daily.  09/13/17  Yes [provider]  Multiple Vitamins-Minerals (ICAPS MV) TABS Take 1 tablet by mouth in the morning and at bedtime.   Yes [provider]  potassium chloride (K-DUR,KLOR-CON) 10 MEQ tablet Take 10 mEq by mouth every other day. 05/27/18  Yes [provider]  sertraline (ZOLOFT) 25 MG tablet Take 25 mg by mouth daily.   Yes [provider]  TRELEGY ELLIPTA 100-62.5-25 MCG/INH AEPB Inhale 1 puff into the lungs daily. 07/07/20  Yes [provider]  oxyCODONE-acetaminophen (PERCOCET) 5-325 MG tablet Take 1 tablet by mouth every 4 (four) hours as needed for severe pain. Patient not taking: Reported on 07/03/2021  10/24/20   Mesner, Corene Cornea, MD    Inpatient Medications:  Current Facility-Administered Medications:    0.9 %  sodium chloride infusion, 10 mL/hr, Intravenous, Once, Adefeso, Oladapo, DO   acetaminophen (TYLENOL) tablet 650 mg, 650 mg, Oral, Q6H PRN **OR** acetaminophen (TYLENOL) suppository 650 mg, 650 mg, Rectal, Q6H PRN, Adefeso, Oladapo, DO   azithromycin (ZITHROMAX) 500 mg in sodium chloride 0.9 % 250 mL IVPB, 500 mg, Intravenous, Q24H, Adefeso, Oladapo, DO   budesonide (PULMICORT) nebulizer solution 0.5 mg, 0.5 mg, Nebulization, BID, Tat, David, MD, 0.5 mg at 07/04/21 0759   cefTRIAXone (ROCEPHIN) 2 g in sodium chloride 0.9 % 100 mL IVPB, 2 g, Intravenous, Q24H, Adefeso, Oladapo, DO   Chlorhexidine Gluconate Cloth 2 % PADS 6 each, 6 each, Topical, Q0600, Tat, Shanon Brow, MD, 6 each at 07/04/21 0716   dextromethorphan-guaiFENesin (MUCINEX DM) 30-600 MG per 12 hr tablet 1 tablet, 1 tablet, Oral, BID, Adefeso, Oladapo, DO, 1 tablet at 07/04/21 0101   furosemide (LASIX) injection 40 mg, 40 mg, Intravenous, Daily, Tat, David, MD   guaiFENesin-dextromethorphan (ROBITUSSIN DM) 100-10 MG/5ML syrup 5 mL, 5 mL, Oral, Q4H PRN, Adefeso, Oladapo, DO   ipratropium (ATROVENT) nebulizer solution 0.5 mg, 0.5 mg, Nebulization, Q6H, Tat, David, MD, 0.5 mg at 07/04/21 0800   levalbuterol  (XOPENEX) nebulizer solution 0.63 mg, 0.63 mg, Nebulization, Q6H, Adefeso, Oladapo, DO, 0.63 mg at 07/04/21 0759   levalbuterol (XOPENEX) nebulizer solution 0.63 mg, 0.63 mg, Nebulization, Q4H PRN, Adefeso, Oladapo, DO, 0.63 mg at 07/04/21 0447   levothyroxine (SYNTHROID) tablet 25 mcg, 25 mcg, Oral, Q0600, Adefeso, Oladapo, DO, 25 mcg at 07/04/21 0546   midodrine (PROAMATINE) tablet 2.5 mg, 2.5 mg, Oral, BID WC, Adefeso, Oladapo, DO, 2.5 mg at 07/04/21 0723   montelukast (SINGULAIR) tablet 10 mg, 10 mg, Oral, Daily, Adefeso, Oladapo, DO   morphine (PF) 2 MG/ML injection 1 mg, 1 mg, Intravenous, Q4H PRN, Adefeso, Oladapo, DO   pantoprazole (PROTONIX) EC tablet 40 mg, 40 mg, Oral, Daily, Adefeso, Oladapo, DO Allergies: Ivp dye [iodinated contrast media], Omnipaque [iohexol], Sulfa antibiotics, Amiodarone, Carbamazepine, Dilaudid [hydromorphone], and Tramadol  Complete Review of Systems: GENERAL: negative for malaise, night sweats HEENT: No changes in hearing or vision, no nose bleeds or other nasal problems. NECK: Negative for lumps, goiter, pain and significant neck swelling RESPIRATORY: Negative for cough, wheezing CARDIOVASCULAR: Negative for chest pain, leg swelling, palpitations, orthopnea GI: SEE HPI MUSCULOSKELETAL: Negative for joint pain or swelling, back pain, and muscle pain. SKIN: Negative for lesions, rash PSYCH: Negative for sleep disturbance, mood disorder and recent psychosocial stressors. HEMATOLOGY Negative for prolonged bleeding, bruising easily, and swollen nodes. ENDOCRINE: Negative for cold or heat intolerance, polyuria, polydipsia and goiter. NEURO: negative for tremor, gait imbalance, syncope and seizures. The remainder of the review of systems is noncontributory.  Physical Exam: BP (!) 145/34    Pulse (!) 30    Temp 97.8 F (36.6 C) (Oral)    Resp (!) 22    Wt 67 kg    SpO2 (!) 70%    BMI 22.46 kg/m  GENERAL: The patient is AO x1, in no acute distress. On [place]. Very  somnolent. HEENT: Head is normocephalic and atraumatic. EOMI are intact. Mouth is well hydrated and without lesions. NECK: Supple. No masses LUNGS: Decreased breath sounds bilaterally, there is mild wheezing. HEART: Mildly tachycardic but rhythmic, normal s1 and s2. ABDOMEN: Soft, nontender, no guarding, no peritoneal signs, and nondistended. BS +. No masses. RECTAL: Deferred EXTREMITIES: Without any cyanosis, clubbing,  rash, lesions or edema. NEUROLOGIC: AOx1, no focal motor deficit. SKIN: no jaundice, no rashes  Laboratory Data CBC:     Component Value Date/Time   WBC 4.2 07/03/2021 2137   RBC 1.73 (L) 07/03/2021 2137   HGB 5.8 (LL) 07/03/2021 2137   HCT 18.4 (L) 07/03/2021 2137   PLT 565 (H) 07/03/2021 2137   MCV 106.4 (H) 07/03/2021 2137   MCH 33.5 07/03/2021 2137   MCHC 31.5 07/03/2021 2137   RDW 19.4 (H) 07/03/2021 2137   LYMPHSABS 0.7 07/03/2021 2137   MONOABS 0.1 07/03/2021 2137   EOSABS 0.0 07/03/2021 2137   BASOSABS 0.0 07/03/2021 2137   COAG:  Lab Results  Component Value Date   INR 1.3 (H) 09/01/2020   INR 1.5 (H) 08/31/2020   INR 1.1 07/17/2019    BMP:  BMP Latest Ref Rng & Units 07/03/2021 09/07/2020 09/02/2020  Glucose 70 - 99 mg/dL 109(H) 115(H) 101(H)  BUN 8 - 23 mg/dL '21 20 14  ' Creatinine 0.44 - 1.00 mg/dL 0.73 1.00 0.80  Sodium 135 - 145 mmol/L 136 142 143  Potassium 3.5 - 5.1 mmol/L 4.3 4.3 4.2  Chloride 98 - 111 mmol/L 102 105 107  CO2 22 - 32 mmol/L '27 27 26  ' Calcium 8.9 - 10.3 mg/dL 8.9 9.3 9.9    HEPATIC:  Hepatic Function Latest Ref Rng & Units 09/02/2020 09/01/2020 08/31/2020  Total Protein 6.5 - 8.1 g/dL 6.4(L) 6.3(L) 7.0  Albumin 3.5 - 5.0 g/dL 3.9 3.8 4.2  AST 15 - 41 U/L '20 20 21  ' ALT 0 - 44 U/L '15 14 14  ' Alk Phosphatase 38 - 126 U/L 64 63 69  Total Bilirubin 0.3 - 1.2 mg/dL 0.6 0.4 0.4  Bilirubin, Direct 0.0 - 0.3 mg/dL - - -    CARDIAC:  Lab Results  Component Value Date   CKTOTAL 83 05/31/2011   CKMB 4.0 05/31/2011   TROPONINI  <0.03 09/02/2017     Imaging: I personally reviewed and interpreted the available imaging.  Assessment & Plan: Deborah [name] is a 88 y.o. female with multiple medical conditions including Alzheimer disease, asthma, atrial fibrillation on Eliquis, chronic diastolic heart failure, COPD, coronary artery disease status stent placement in 2009, seizures, hyperlipidemia and osteoarthritis, who came to the hospital after presenting worsening shortness of breath.  Gastroenterology was consulted for evaluation of anemia.  The patient has not presented any overt symptoms of gastrointestinal bleeding.  She was found to have severe anemia for which she was ordered 2 units of blood that were given yesterday.  I discussed with the hospitalist the importance of characterizing her anemia further by checking her iron stores and folate/B12 levels as her anemia is macrocytic in nature.  Given her age, comorbidities and current respiratory infection, we will perform an eventual EGD if she is found to have iron deficiency or if she is requiring repeat blood transfusions with presence of overt bleeding.  Notably, she had a negative fecal occult blood test which makes it less likely that she is presenting any ongoing losses from the gastrointestinal tract.  The daughter understood and agreed with this plan.  - Follow iron stores, W09 and folic acid levels - Repeat CBC q day, transfuse if Hb <8 -Hold Eliquis for now - Pantoprazole 40 mg q12h IVP - 2 large bore IV lines - Active T/S -Advance diet as tolerated - Avoid NSAIDs - Will proceed with EGD only if she presents iron deficiency anemia or if she presents overt gastrointestinal  bleeding -timing of this will be determined by the course of her respiratory infection  Harvel Quale, MD Gastroenterology and Hepatology [facility]

## 2021-07-04 NOTE — Assessment & Plan Note (Addendum)
Baseline Hgb 12 Presented with Hgb 5.8 2 units PRBC ordered Consult GI even though FOBT negative as the patient had hemoglobin 12.4 back in September 01, 2020 Serum [id number] Folic [id number] -appreciate GI input, considering outpatient EGD/colonoscopy Her stools have been brown in color Hgb has been stable post transfusion at 8-9 range

## 2021-07-04 NOTE — Assessment & Plan Note (Deleted)
-   Continue home meds °

## 2021-07-04 NOTE — Assessment & Plan Note (Deleted)
Reactive

## 2021-07-04 NOTE — Assessment & Plan Note (Addendum)
Diuresed with IV lasix and appears to be euvolemic now Daily weights--NEG 5 lbs since admission Accurate I's and O's--incomplete 07/04/20 echo--EF 60-65%, no WMA, severe pulm HTN--69.2; mod TR Transitioned back to oral lasix, maintenance dose increased from 10mg  daily to 20mg  daily

## 2021-07-04 NOTE — Assessment & Plan Note (Signed)
Midodrine  

## 2021-07-04 NOTE — Assessment & Plan Note (Deleted)
Check B12 and folate level.  

## 2021-07-05 DIAGNOSIS — R0602 Shortness of breath: Secondary | ICD-10-CM

## 2021-07-05 DIAGNOSIS — D500 Iron deficiency anemia secondary to blood loss (chronic): Secondary | ICD-10-CM

## 2021-07-05 DIAGNOSIS — D649 Anemia, unspecified: Secondary | ICD-10-CM

## 2021-07-05 DIAGNOSIS — F0390 Unspecified dementia without behavioral disturbance: Secondary | ICD-10-CM

## 2021-07-05 DIAGNOSIS — I4821 Permanent atrial fibrillation: Secondary | ICD-10-CM

## 2021-07-05 LAB — CBC
HCT: 29.1 % — ABNORMAL LOW (ref 36.0–46.0)
Hemoglobin: 9.3 g/dL — ABNORMAL LOW (ref 12.0–15.0)
MCH: 32.2 pg (ref 26.0–34.0)
MCHC: 32 g/dL (ref 30.0–36.0)
MCV: 100.7 fL — ABNORMAL HIGH (ref 80.0–100.0)
Platelets: 514 10*3/uL — ABNORMAL HIGH (ref 150–400)
RBC: 2.89 MIL/uL — ABNORMAL LOW (ref 3.87–5.11)
RDW: 21.3 % — ABNORMAL HIGH (ref 11.5–15.5)
WBC: 6.7 10*3/uL (ref 4.0–10.5)
nRBC: 0 % (ref 0.0–0.2)

## 2021-07-05 LAB — TYPE AND SCREEN
ABO/RH(D): A POS
Antibody Screen: NEGATIVE
Unit division: 0
Unit division: 0

## 2021-07-05 LAB — FERRITIN: Ferritin: 417 ng/mL — ABNORMAL HIGH (ref 11–307)

## 2021-07-05 LAB — BASIC METABOLIC PANEL
Anion gap: 11 (ref 5–15)
BUN: 27 mg/dL — ABNORMAL HIGH (ref 8–23)
CO2: 28 mmol/L (ref 22–32)
Calcium: 8.4 mg/dL — ABNORMAL LOW (ref 8.9–10.3)
Chloride: 100 mmol/L (ref 98–111)
Creatinine, Ser: 0.76 mg/dL (ref 0.44–1.00)
GFR, Estimated: >60 mL/min (ref >60)
Glucose, Bld: 99 mg/dL (ref 70–99)
Potassium: 4.2 mmol/L (ref 3.5–5.1)
Sodium: 139 mmol/L (ref 135–145)

## 2021-07-05 LAB — BPAM RBC
Blood Product Expiration Date: 202303052359
Blood Product Expiration Date: 202303062359
ISSUE DATE / TIME: 202302120224
ISSUE DATE / TIME: 202302120601
Unit Type and Rh: 6200
Unit Type and Rh: 6200

## 2021-07-05 LAB — IRON AND TIBC
Iron: 181 ug/dL — ABNORMAL HIGH (ref 28–170)
Saturation Ratios: 47 % — ABNORMAL HIGH (ref 10.4–31.8)
TIBC: 384 ug/dL (ref 250–450)
UIBC: 203 ug/dL

## 2021-07-05 LAB — LEGIONELLA PNEUMOPHILA SEROGP 1 UR AG: L. pneumophila Serogp 1 Ur Ag: NEGATIVE

## 2021-07-05 LAB — DIGOXIN LEVEL: Digoxin Level: 0.7 ng/mL — ABNORMAL LOW (ref 0.8–2.0)

## 2021-07-05 LAB — MRSA NEXT GEN BY PCR, NASAL: MRSA by PCR Next Gen: NOT DETECTED

## 2021-07-05 MED ORDER — ENSURE ENLIVE PO LIQD
237.0000 mL | Freq: Two times a day (BID) | ORAL | Status: DC
  Administered 2021-07-05 – 2021-07-07 (×5): 237 mL via ORAL

## 2021-07-05 MED ORDER — HALOPERIDOL LACTATE 5 MG/ML IJ SOLN
2.0000 mg | Freq: Once | INTRAMUSCULAR | Status: AC
  Administered 2021-07-05: 2 mg via INTRAMUSCULAR
  Filled 2021-07-05: qty 1

## 2021-07-05 MED ORDER — LEVALBUTEROL HCL 0.63 MG/3ML IN NEBU
0.6300 mg | INHALATION_SOLUTION | Freq: Two times a day (BID) | RESPIRATORY_TRACT | Status: DC
  Administered 2021-07-05 – 2021-07-07 (×4): 0.63 mg via RESPIRATORY_TRACT
  Filled 2021-07-05 (×5): qty 3

## 2021-07-05 MED ORDER — LEVALBUTEROL HCL 0.63 MG/3ML IN NEBU: 0.6300 mg | INHALATION_SOLUTION | Freq: Four times a day (QID) | RESPIRATORY_TRACT | Status: DC | PRN

## 2021-07-05 MED ORDER — IPRATROPIUM BROMIDE 0.02 % IN SOLN
0.5000 mg | Freq: Two times a day (BID) | RESPIRATORY_TRACT | Status: DC
  Administered 2021-07-05 – 2021-07-07 (×4): 0.5 mg via RESPIRATORY_TRACT
  Filled 2021-07-05 (×5): qty 2.5

## 2021-07-05 NOTE — Progress Notes (Addendum)
PROGRESS NOTE  Deborah [name] [id number] DOB: 31-Jul-1925 DOA: 07/03/2021 PCP: Lynnell Catalan, FNP  Brief History:  88 year old female with a history of Alzheimer's dementia, coronary disease, permanent atrial fibrillation, diastolic CHF, hypertension, hyperlipidemia, COPD, seizure order presenting with shortness of breath that began on the morning of 07/03/2020.  Patient was given some Xanax with some improvement.  However she redeveloped shortness of breath again on the evening of 07/03/2021.  As result, the patient was brought to emergency department for further evaluation. The patient is a poor historian secondary to her Alzheimer's dementia.  History is from review of the medical record and speaking with the patient's daughter.  The patient resides at [facility].  At baseline, the patient is pleasantly confused and requires minimal assist with her ADLs including dressing, bathing, and going to the bathroom In speaking with the patient's daughter, it appears that the patient has had some shortness of breath for 2 to 3 days prior to admission.  There is no history of nausea, vomiting, diarrhea, abdominal pain.  There have not been any reports of hematochezia or melena.  However, the patient has been having coughing, chest congestion, and wheezing.  The patient has not been started any new medications recently. In the emergency department, the patient was noted to be hypoxic with oxygen saturation 86-80% on room air.  She was afebrile with heart rates 110-120.  She was placed on 4 L nasal cannula with saturation up to 100%.  WBC 4.2, hemoglobin 5.8, platelets vomiting 65,000.  BMP shows sodium 136, potassium 4.3, bicarbonate 27, BUN 21, creatinine 0.73.     Assessment and Plan: * Acute respiratory failure with hypoxia ([place])- (present on admission) Presented with saturation 86% RA and tachypnea Stable on 4L>>2L>>1.5 Due to CHF  Wean oxygen for saturation >92% Check PCT  <0.10  Symptomatic anemia- (present on admission) Baseline Hgb 12 Presented with Hgb 5.8 2 units PRBC ordered Consult GI even though FOBT negative as the patient had hemoglobin 12.4 back in September 01, 2020 Serum [id number] Folic [id number] -appreciate GI input  Permanent atrial fibrillation ([place])- (present on admission) Patient had RVR at the time of admission Now rate controlled Temporarily holding apixaban due to drop in hemoglobin Continue digoxin  Major neurocognitive disorder ([place])- (present on admission) She is at risk for hospital delirium  Hypothyroidism- (present on admission) Continue synthroid  COPD exacerbation ([place])- (present on admission) Start Xopenex and Atrovent Start Pulmicort  Hypotension Midodrine  Acute on chronic diastolic CHF (congestive heart failure) ([place]) remains fluid overloaded clinically Continue IV furosemide Daily weights Accurate I's and O's--1.76L out 2/12 07/04/20 echo--EF 60-65%, no WMA, severe pulm HTN--69.2; mod TR          Family Communication: Daughter updated 2/13   Consultants: GI   Code Status:  DNR   DVT Prophylaxis:  SCDs     Procedures: As Listed in Progress Note Above   Antibiotics: Ceftriaxone 2/11>>2/12 Azithro 2/11>>2/12             Subjective: She is breathing better.  She denies any chest pain, shortness of breath, abdominal pain, nausea, vomiting.  There is no headache  Objective: Vitals:   07/05/21 0759 07/05/21 0800 07/05/21 0827 07/05/21 0828  BP:  (!) 182/85    Pulse:  72    Resp:  (!) 25    Temp: 98.7 F (37.1 C)     TempSrc: Oral     SpO2:  (!) 89%  100% 100%  Weight:        Intake/Output Summary (Last 24 hours) at 07/05/2021 1202 Last data filed at 07/05/2021 0900 Gross per 24 hour  Intake 847.15 ml  Output 1450 ml  Net -602.85 ml   Weight change: -1.1 kg Exam:  General:  Pt is alert, follows commands appropriately, not in acute distress HEENT: No icterus, No thrush, No  neck mass, [place]/AT Cardiovascular: RRR, S1/S2, no rubs, no gallops Respiratory: Bibasilar rales, no wheezing Abdomen: Soft/+BS, non tender, non distended, no guarding Extremities: 1 + LE edema, No lymphangitis, No petechiae, No rashes, no synovitis   Data Reviewed: I have personally reviewed following labs and imaging studies Basic Metabolic Panel: Recent Labs  Lab 07/03/21 2137 07/04/21 1400 07/05/21 0418  NA 136 136 139  K 4.3 4.4 4.2  CL 102 100 100  CO2 27 28 28   GLUCOSE 109* 134* 99  BUN 21 21 27*  CREATININE 0.73 0.75 0.76  CALCIUM 8.9 8.3* 8.4*  MG  --  2.3   2.3  --   PHOS  --  4.2  --    Liver Function Tests: Recent Labs  Lab 07/04/21 1400  AST 28  ALT 20  ALKPHOS 98  BILITOT 0.7  PROT 6.7  ALBUMIN 3.7   No results for input(s): LIPASE, AMYLASE in the last 168 hours. No results for input(s): AMMONIA in the last 168 hours. Coagulation Profile: No results for input(s): INR, PROTIME in the last 168 hours. CBC: Recent Labs  Lab 07/03/21 2137 07/04/21 1400 07/05/21 0418  WBC 4.2 5.8   5.7 6.7  NEUTROABS 3.3  --   --   HGB 5.8* 9.9*   9.2* 9.3*  HCT 18.4* 30.1*   28.7* 29.1*  MCV 106.4* 98.0   98.3 100.7*  PLT 565* 647*   635* 514*   Cardiac Enzymes: No results for input(s): CKTOTAL, CKMB, CKMBINDEX, TROPONINI in the last 168 hours. BNP: Invalid input(s): POCBNP CBG: No results for input(s): GLUCAP in the last 168 hours. HbA1C: No results for input(s): HGBA1C in the last 72 hours. Urine analysis:    Component Value Date/Time   COLORURINE YELLOW 10/24/2020 0520   APPEARANCEUR HAZY (A) 10/24/2020 0520   LABSPEC 1.018 10/24/2020 0520   PHURINE 6.0 10/24/2020 0520   GLUCOSEU NEGATIVE 10/24/2020 0520   HGBUR NEGATIVE 10/24/2020 0520   BILIRUBINUR NEGATIVE 10/24/2020 0520   KETONESUR NEGATIVE 10/24/2020 0520   PROTEINUR NEGATIVE 10/24/2020 0520   UROBILINOGEN 0.2 04/28/2012 1830   NITRITE POSITIVE (A) 10/24/2020 0520   LEUKOCYTESUR LARGE (A)  10/24/2020 0520   Sepsis Labs: @LABRCNTIP (procalcitonin:4,lacticidven:4) ) Recent Results (from the past 240 hour(s))  Culture, blood (routine x 2) Call MD if unable to obtain prior to antibiotics being given     Status: None (Preliminary result)   Collection Time: 07/03/21 10:54 PM   Specimen: Left Antecubital; Blood  Result Value Ref Range Status   Specimen Description LEFT ANTECUBITAL  Final   Special Requests   Final    BOTTLES DRAWN AEROBIC ONLY Blood Culture adequate volume   Culture   Final    NO GROWTH 1 DAY Performed at [facility], [address] [place], [place] [address]    Report Status PENDING  Incomplete  Resp Panel by RT-PCR (Flu A&B, Covid) Nasopharyngeal Swab     Status: None   Collection Time: 07/03/21 11:16 PM   Specimen: Nasopharyngeal Swab; Nasopharyngeal(NP) swabs in vial transport medium  Result Value Ref Range Status  SARS Coronavirus 2 by RT PCR NEGATIVE NEGATIVE Final    Comment: (NOTE) SARS-CoV-2 target nucleic acids are NOT DETECTED.  The SARS-CoV-2 RNA is generally detectable in upper respiratory specimens during the acute phase of infection. The lowest concentration of SARS-CoV-2 viral copies this assay can detect is 138 copies/mL. A negative result does not preclude SARS-Cov-2 infection and should not be used as the sole basis for treatment or other patient management decisions. A negative result may occur with  improper specimen collection/handling, submission of specimen other than nasopharyngeal swab, presence of viral mutation(s) within the areas targeted by this assay, and inadequate number of viral copies(<138 copies/mL). A negative result must be combined with clinical observations, patient history, and epidemiological information. The expected result is Negative.  Fact Sheet for Patients:  EntrepreneurPulse.com.au  Fact Sheet for Healthcare Providers:  IncredibleEmployment.be  This test is no t  yet approved or cleared by the [place] FDA and  has been authorized for detection and/or diagnosis of SARS-CoV-2 by FDA under an Emergency Use Authorization (EUA). This EUA will remain  in effect (meaning this test can be used) for the duration of the COVID-19 declaration under Section 564(b)(1) of the Act, 21 U.S.C.section 360bbb-3(b)(1), unless the authorization is terminated  or revoked sooner.       Influenza A by PCR NEGATIVE NEGATIVE Final   Influenza B by PCR NEGATIVE NEGATIVE Final    Comment: (NOTE) The Xpert Xpress SARS-CoV-2/FLU/RSV plus assay is intended as an aid in the diagnosis of influenza from Nasopharyngeal swab specimens and should not be used as a sole basis for treatment. Nasal washings and aspirates are unacceptable for Xpert Xpress SARS-CoV-2/FLU/RSV testing.  Fact Sheet for Patients: EntrepreneurPulse.com.au  Fact Sheet for Healthcare Providers: IncredibleEmployment.be  This test is not yet approved or cleared by the [place] FDA and has been authorized for detection and/or diagnosis of SARS-CoV-2 by FDA under an Emergency Use Authorization (EUA). This EUA will remain in effect (meaning this test can be used) for the duration of the COVID-19 declaration under Section 564(b)(1) of the Act, 21 U.S.C. section 360bbb-3(b)(1), unless the authorization is terminated or revoked.  Performed at [facility], [address], [place], [place] [address]   Culture, blood (routine x 2) Call MD if unable to obtain prior to antibiotics being given     Status: None (Preliminary result)   Collection Time: 07/04/21  1:59 PM   Specimen: Left Antecubital; Blood  Result Value Ref Range Status   Specimen Description   Final    LEFT ANTECUBITAL BOTTLES DRAWN AEROBIC AND ANAEROBIC   Special Requests Blood Culture adequate volume  Final   Culture   Final    NO GROWTH < 24 HOURS Performed at [facility], [address]  [place],  [address]    Report Status PENDING  Incomplete     Scheduled Meds:  budesonide (PULMICORT) nebulizer solution  0.5 mg Nebulization BID   Chlorhexidine Gluconate Cloth  6 each Topical Q0600   dextromethorphan-guaiFENesin  1 tablet Oral BID   furosemide  40 mg Intravenous Daily   ipratropium  0.5 mg Nebulization BID   levalbuterol  0.63 mg Nebulization BID   levothyroxine  25 mcg Oral Q0600   midodrine  2.5 mg Oral BID WC   montelukast  10 mg Oral Daily   pantoprazole  40 mg Oral Daily   Continuous Infusions:  sodium chloride      Procedures/Studies: CT HEAD WO CONTRAST (5MM)  Result Date: 06/23/2021  CLINICAL DATA:  Unwitnessed fall. EXAM: CT HEAD WITHOUT CONTRAST CT CERVICAL SPINE WITHOUT CONTRAST TECHNIQUE: Multidetector CT imaging of the head and cervical spine was performed following the standard protocol without intravenous contrast. Multiplanar CT image reconstructions of the cervical spine were also generated. RADIATION DOSE REDUCTION: This exam was performed according to the departmental dose-optimization program which includes automated exposure control, adjustment of the mA and/or kV according to patient size and/or use of iterative reconstruction technique. COMPARISON:  None. FINDINGS: CT HEAD FINDINGS Brain: No evidence of acute infarction, hemorrhage, hydrocephalus, extra-axial collection or mass lesion/mass effect. Again seen is moderate diffuse atrophy and mild periventricular white matter hypodensity, likely chronic small vessel ischemic change. Vascular: No hyperdense vessel or unexpected calcification. Skull: Normal. Negative for fracture or focal lesion. Sinuses/Orbits: No acute finding. Other: None. CT CERVICAL SPINE FINDINGS Alignment: Normal. Skull base and vertebrae: No acute fracture. No primary bone lesion or focal pathologic process. Bones are osteopenic. Soft tissues and spinal canal: No prevertebral fluid or swelling. No visible canal hematoma. Disc levels:  There is intervertebral disc space narrowing and endplate osteophyte formation at C4-C5 and C6-C7 compatible with degenerative change. No significant central canal or neural foraminal stenosis at any level. Upper chest: Negative. Other: Examination is mildly technically limited secondary to motion artifact. IMPRESSION: 1.  No acute intracranial process. 2. No acute fracture or traumatic subluxation of the cervical spine. Examination is mildly technically limited secondary to motion artifact. 3. Stable moderate diffuse brain atrophy and mild chronic small vessel ischemic change. Electronically Signed   By: Ronney Asters M.D.   On: 06/23/2021 00:45   CT CERVICAL SPINE WO CONTRAST  Result Date: 06/23/2021 CLINICAL DATA:  Unwitnessed fall. EXAM: CT HEAD WITHOUT CONTRAST CT CERVICAL SPINE WITHOUT CONTRAST TECHNIQUE: Multidetector CT imaging of the head and cervical spine was performed following the standard protocol without intravenous contrast. Multiplanar CT image reconstructions of the cervical spine were also generated. RADIATION DOSE REDUCTION: This exam was performed according to the departmental dose-optimization program which includes automated exposure control, adjustment of the mA and/or kV according to patient size and/or use of iterative reconstruction technique. COMPARISON:  None. FINDINGS: CT HEAD FINDINGS Brain: No evidence of acute infarction, hemorrhage, hydrocephalus, extra-axial collection or mass lesion/mass effect. Again seen is moderate diffuse atrophy and mild periventricular white matter hypodensity, likely chronic small vessel ischemic change. Vascular: No hyperdense vessel or unexpected calcification. Skull: Normal. Negative for fracture or focal lesion. Sinuses/Orbits: No acute finding. Other: None. CT CERVICAL SPINE FINDINGS Alignment: Normal. Skull base and vertebrae: No acute fracture. No primary bone lesion or focal pathologic process. Bones are osteopenic. Soft tissues and spinal canal:  No prevertebral fluid or swelling. No visible canal hematoma. Disc levels: There is intervertebral disc space narrowing and endplate osteophyte formation at C4-C5 and C6-C7 compatible with degenerative change. No significant central canal or neural foraminal stenosis at any level. Upper chest: Negative. Other: Examination is mildly technically limited secondary to motion artifact. IMPRESSION: 1.  No acute intracranial process. 2. No acute fracture or traumatic subluxation of the cervical spine. Examination is mildly technically limited secondary to motion artifact. 3. Stable moderate diffuse brain atrophy and mild chronic small vessel ischemic change. Electronically Signed   By: Ronney Asters M.D.   On: 06/23/2021 00:45   DG Chest Port 1 View  Result Date: 07/03/2021 CLINICAL DATA:  Shortness of breath EXAM: PORTABLE CHEST 1 VIEW COMPARISON:  11/28/2020 FINDINGS: Increased interstitial markings bilaterally, without a perihilar distribution. This appearance favors  mild infection/pneumonia in this clinical setting, possibly atypical/viral. No definite pleural effusions. No pneumothorax. The heart is top-normal in size.  Thoracic aortic atherosclerosis. IMPRESSION: Increased interstitial markings, favoring mild infection/pneumonia, possibly atypical/viral. Electronically Signed   By: Julian Hy M.D.   On: 07/03/2021 19:57   DG Chest Port 1V same Day  Result Date: 07/03/2021 CLINICAL DATA:  Shortness of breath EXAM: PORTABLE CHEST 1 VIEW COMPARISON:  07/03/2021 FINDINGS: Increased interstitial markings. Biapical pleural parenchymal scarring at the medial lung apices. Small left pleural effusion. No pneumothorax. Cardiomegaly.  Thoracic aortic atherosclerosis. IMPRESSION: Increased interstitial markings, possibly reflecting mild interstitial edema versus atypical infection/pneumonia. Small left pleural effusion. Electronically Signed   By: Julian Hy M.D.   On: 07/03/2021 23:25   ECHOCARDIOGRAM  COMPLETE  Result Date: 07/04/2021    ECHOCARDIOGRAM REPORT   Patient Name:   Deborah [name] Date of Exam: 07/04/2021 Medical Rec #:  [id number]     Height:       68.0 in Accession #:    [id number]    Weight:       147.7 lb Date of Birth:  09-24-1925    BSA:          1.797 m Patient Age:    17 years      BP:           139/42 mmHg Patient Gender: F             HR:           60 bpm. Exam Location:  Inpatient Procedure: 2D Echo, Cardiac Doppler and Color Doppler Indications:    CHF-Acute Systolic [id number]  History:        Patient has prior history of Echocardiogram examinations, most                 recent 07/21/2019. CHF, CAD, COPD, Aortic Valve Disease,                 Arrythmias:Atrial Fibrillation; Risk Factors:Dyslipidemia and                 Former Smoker. Alzheimer disease.  Sonographer:    Darlina Sicilian RDCS Referring Phys: [id number] OLADAPO ADEFESO IMPRESSIONS  1. Left ventricular ejection fraction, by estimation, is 60 to 65%. The left ventricle has normal function. The left ventricle has no regional wall motion abnormalities. There is mild left ventricular hypertrophy of the basal-septal segment. Left ventricular diastolic function could not be evaluated. Elevated left ventricular end-diastolic pressure.  2. Right ventricular systolic function is normal. The right ventricular size is mildly enlarged. There is severely elevated pulmonary artery systolic pressure. The estimated right ventricular systolic pressure is 123456 mmHg.  3. Right atrial size was moderately dilated.  4. The mitral valve is degenerative. Mild mitral valve regurgitation. No evidence of mitral stenosis.  5. Tricuspid valve regurgitation is moderate.  6. The aortic valve is calcified. Aortic valve regurgitation is not visualized. Moderate aortic valve stenosis. Aortic valve area, by VTI measures 0.95 cm. Aortic valve mean gradient measures 19.2 mmHg. Aortic valve Vmax measures 2.94 m/s.  7. The inferior vena cava is dilated in size with <50%  respiratory variability, suggesting right atrial pressure of 15 mmHg.  8. Compared to study dated 07/21/19, the mean AVG has increased from 38mmHg to 58mmhg, Vmax has increased from 2.65m/s to 2.28m/s and DI has decreased from 0.48 to 0.34. AV stenosis is now moderate. FINDINGS  Left Ventricle: Left ventricular ejection fraction, by estimation, is 60 to  65%. The left ventricle has normal function. The left ventricle has no regional wall motion abnormalities. The left ventricular internal cavity size was normal in size. There is  mild left ventricular hypertrophy of the basal-septal segment. Left ventricular diastolic function could not be evaluated. Elevated left ventricular end-diastolic pressure. Right Ventricle: The right ventricular size is mildly enlarged. No increase in right ventricular wall thickness. Right ventricular systolic function is normal. There is severely elevated pulmonary artery systolic pressure. The tricuspid regurgitant velocity is 3.68 m/s, and with an assumed right atrial pressure of 15 mmHg, the estimated right ventricular systolic pressure is 123456 mmHg. Left Atrium: Left atrial size was normal in size. Right Atrium: Right atrial size was moderately dilated. Pericardium: There is no evidence of pericardial effusion. Mitral Valve: The mitral valve is degenerative in appearance. There is mild thickening of the mitral valve leaflet(s). Mild to moderate mitral annular calcification. Mild mitral valve regurgitation. No evidence of mitral valve stenosis. Tricuspid Valve: The tricuspid valve is normal in structure. Tricuspid valve regurgitation is moderate . No evidence of tricuspid stenosis. Aortic Valve: The aortic valve is calcified. Aortic valve regurgitation is not visualized. Moderate aortic stenosis is present. Aortic valve mean gradient measures 19.2 mmHg. Aortic valve peak gradient measures 34.7 mmHg. Aortic valve area, by VTI measures 0.95 cm. Pulmonic Valve: The pulmonic valve was normal  in structure. Pulmonic valve regurgitation is trivial. No evidence of pulmonic stenosis. Aorta: The aortic root is normal in size and structure. Venous: The inferior vena cava is dilated in size with less than 50% respiratory variability, suggesting right atrial pressure of 15 mmHg. IAS/Shunts: No atrial level shunt detected by color flow Doppler.  LEFT VENTRICLE PLAX 2D LVIDd:         4.60 cm      Diastology LVIDs:         3.20 cm      LV e' medial:    7.78 cm/s LV PW:         0.80 cm      LV E/e' medial:  16.6 LV IVS:        1.20 cm      LV e' lateral:   7.18 cm/s LVOT diam:     1.90 cm      LV E/e' lateral: 18.0 LV SV:         71 LV SV Index:   40 LVOT Area:     2.84 cm  LV Volumes (MOD) LV vol d, MOD A2C: 107.0 ml LV vol d, MOD A4C: 106.0 ml LV vol s, MOD A2C: 36.6 ml LV vol s, MOD A4C: 22.3 ml LV SV MOD A2C:     70.4 ml LV SV MOD A4C:     106.0 ml LV SV MOD BP:      77.8 ml RIGHT VENTRICLE RV S prime:     11.10 cm/s TAPSE (M-mode): 1.9 cm LEFT ATRIUM             Index        RIGHT ATRIUM           Index LA diam:        4.70 cm 2.62 cm/m   RA Area:     23.00 cm LA Vol (A2C):   72.9 ml 40.57 ml/m  RA Volume:   62.10 ml  34.56 ml/m LA Vol (A4C):   51.1 ml 28.44 ml/m LA Biplane Vol: 61.2 ml 34.06 ml/m  AORTIC VALVE AV Area (Vmax):  1.61 cm AV Area (Vmean):   1.02 cm AV Area (VTI):     0.95 cm AV Vmax:           294.40 cm/s AV Vmean:          207.600 cm/s AV VTI:            0.747 m AV Peak Grad:      34.7 mmHg AV Mean Grad:      19.2 mmHg LVOT Vmax:         167.67 cm/s LVOT Vmean:        74.500 cm/s LVOT VTI:          0.250 m LVOT/AV VTI ratio: 0.34  AORTA Ao Root diam: 2.80 cm Ao Asc diam:  3.00 cm MITRAL VALVE                TRICUSPID VALVE MV Area (PHT):  5.23 cm    TR Peak grad:   54.2 mmHg MV Area (plan): 4.58 cm    TR Vmax:        368.00 cm/s MV Decel Time:  145 msec MV E velocity: 129.50 cm/s  SHUNTS                             Systemic VTI:  0.25 m                             Systemic Diam:  1.90 cm Fransico Him MD Electronically signed by Fransico Him MD Signature Date/Time: 07/04/2021/3:26:30 PM    Final     Orson Eva, DO  [facility]  If 7PM-7AM, please contact night-coverage [email or web address] Password [email or web address] 07/05/2021, 12:02 PM   LOS: 2 days

## 2021-07-05 NOTE — TOC Initial Note (Signed)
Transition of Care [facility]) - Initial/Assessment Note    Patient Details  Name: Deborah [name] MRN: [id number] Date of Birth: 23-Nov-1925  Transition of Care [facility]) CM/SW Contact:    Salome Arnt, LCSW Phone Number: 07/05/2021, 11:40 AM  Clinical Narrative:  Pt admitted due to  symptomatic anemia. Assessment completed with pt's daughter as pt oriented to self only per chart. Pt has been a resident at [facility] for about 5 years. Pt's daughter, Manuela Schwartz requests return to [place] when medically stable. Per Pricilla Handler, RN at [place], pt is on dementia unit. She is active with [employer] consultant for wound care. Pt ambulates with a walker at baseline and is fairly independent with ADLs. Okay to return. TOC will continue to follow. Sarah with [name] notified of admission.                 Expected Discharge Plan: Assisted Living Barriers to Discharge: Continued Medical Work up   Patient Goals and CMS Choice Patient states their goals for this hospitalization and ongoing recovery are:: return to ALF   Choice offered to / list presented to : Adult Children  Expected Discharge Plan and Services Expected Discharge Plan: Assisted Living In-house Referral: Clinical Social Work   Post Acute Care Choice: Resumption of Svcs/PTA Provider Living arrangements for the past 2 months: Assisted Living Facility                           HH Arranged: RN [facility] Agency: [place] Date [facility]: 07/05/21 Time [place]: [id number] Representative spoke with at [place]  Prior Living Arrangements/Services Living arrangements for the past 2 months: [place] Lives with:: Facility Resident Patient language and need for interpreter reviewed:: Yes Do you feel safe going back to the place where you live?: Yes      Need for Family Participation in Patient Care: Yes (Comment) Care giver support system in place?: Yes (comment) Current home services: DME  (walker) Criminal Activity/Legal Involvement Pertinent to Current Situation/Hospitalization: No - Comment as needed  Activities of Daily Living      Permission Sought/Granted Permission sought to share information with : Facility Art therapist granted to share information with : Yes, Verbal Permission Granted  Share Information with NAME: Nanine Means     Permission granted to share info w Relationship: ALF     Emotional Assessment   Attitude/Demeanor/Rapport: Unable to Assess Affect (typically observed): Unable to Assess Orientation: : Oriented to Self Alcohol / Substance Use: Not Applicable Psych Involvement: No (comment)  Admission diagnosis:  SOB (shortness of breath) [R06.02] Iron deficiency anemia due to chronic blood loss [D50.0] Symptomatic anemia [D64.9] Community acquired pneumonia, unspecified laterality [J18.9] Patient Active Problem List   Diagnosis Date Noted   Elevated brain natriuretic peptide (BNP) level 07/04/2021   Elevated MCV 07/04/2021   Thrombocytosis 07/04/2021   Acute respiratory failure with hypoxia (HCC) 07/04/2021   Hypothyroidism 07/04/2021   GERD (gastroesophageal reflux disease) 07/04/2021   Major neurocognitive disorder ([place]) 07/04/2021   Symptomatic anemia [id number]   Acute metabolic encephalopathy 0000000   Generalized weakness 09/01/2020   Hypercalcemia 09/01/2020   Dehydration 09/01/2020   Essential hypertension 09/01/2020   Acute pulmonary embolism without acute cor pulmonale ([place]) 07/20/2019   Sepsis due to undetermined organism ([place]) 07/18/2019   AKI (acute kidney injury) ([place]) 07/18/2019   Acute cystitis without hematuria    Sepsis ([place]) 07/17/2019   COVID-19 virus infection 07/17/2019  Chest pain 09/02/2017   Compression fracture of body of thoracic vertebra (HCC) 06/22/2017   Atrial fibrillation with rapid ventricular response ([place])    Screening for colorectal cancer 03/24/2017   Hemorrhoids 03/24/2017    Itching in the vaginal area 03/24/2017   Vulval thrush 03/24/2017   Hypoxemia    Alzheimer disease ([place]) 09/13/2016   Seizures ([place]) 09/13/2016   Hypokalemia 09/13/2016   COPD exacerbation ([place]) 09/13/2016   Hypotension 04/22/2012   CAD S/P percutaneous coronary angioplasty    COPD (chronic obstructive pulmonary disease) ([place])    Permanent atrial fibrillation ([place]) 02/10/2009   Acute on chronic diastolic CHF (congestive heart failure) ([place]) 02/10/2009   PCP:  Lynnell Catalan, FNP Pharmacy:   [facility], [address] [place]. [place] [address] Phone: [phone] Fax: [phone]     Social Determinants of Health (SDOH) Interventions    Readmission Risk Interventions Readmission Risk Prevention Plan 07/05/2021  Transportation Screening Complete  HRI or [place] Complete  Social Work Consult for [place] Planning/Counseling Complete  Palliative Care Screening Not Applicable  Medication Review Press photographer) Complete  Some recent data might be hidden

## 2021-07-05 NOTE — Progress Notes (Signed)
Gastroenterology Progress Note   Referring Provider: No ref. provider found Primary Care Physician:  Lynnell Catalan, [place] Primary Gastroenterologist:  Dr. Laural Golden  Patient ID: Deborah [name]; [id number]; 1925/08/12    Subjective   Patient states she is feeling well today. She feels like her breathing is improved. She denies any dysphagia, early satiety, decreased appetite, N/V. She sates she has not had a BM since her hospital stay but had her first full meal this AM. She denies any blood in the stool or hematemesis. She also denies any pain. Due to her decreased mental state the daughter provided some further insight. Per her daughter Deborah [name]) she has not noted any hematemesis or melena and feels as though her alertness, breathing, and appetite are significantly improved from yesterday and day of admission.   Objective   Vital signs in last 24 hours Temp:  [98.2 F (36.8 C)-98.7 F (37.1 C)] 98.7 F (37.1 C) (02/13 0759) Pulse Rate:  [35-72] 72 (02/13 0800) Resp:  [12-25] 25 (02/13 0800) BP: (91-182)/(34-123) 182/85 (02/13 0800) SpO2:  [89 %-100 %] 100 % (02/13 0828) Weight:  [65.9 kg] 65.9 kg (02/13 0500) Last BM Date: 07/04/21  Physical Exam General:   Alert and oriented x3, pleasant.  Head:  Normocephalic and atraumatic. Eyes:  No icterus, sclera clear. Conjuctiva pink.  Mouth:  Without lesions, mucosa pink and moist.  Heart:  S1, S2 present, no murmurs noted.  Lungs: Wheezing present on insipiration. Cough with deep inspiration. Lungs sounds clear and diminished in the base on right. Left lungs diminished with rhonchi.   Abdomen:  Golf ball sized, soft, reducible umbilical hernia present. Bowel sounds present, soft, non-tender, non-distended. No HSM. No rebound or guarding. Msk: Normal posture. Extremities:  +1 edema to BLE (baseline per daughter). Without clubbing. Neurologic:  Alert and  oriented x3; repetitive Skin:  Warm and dry, bruising to BUE. Psych:  Alert and  cooperative. Normal mood and affect.  Intake/Output from previous day: 02/12 0701 - 02/13 0700 In: 1408.2 [P.O.:720; Blood:321; IV Piggyback:367.2] Out: 1150 [Urine:1150] Intake/Output this shift: Total I/O In: -  Out: 300 [Urine:300]  Lab Results  Recent Labs    07/03/21 2137 07/04/21 1400 07/05/21 0418  WBC 4.2 5.8   5.7 6.7  HGB 5.8* 9.9*   9.2* 9.3*  HCT 18.4* 30.1*   28.7* 29.1*  PLT 565* 647*   635* 514*   BMET Recent Labs    07/03/21 2137 07/04/21 1400 07/05/21 0418  NA 136 136 139  K 4.3 4.4 4.2  CL 102 100 100  CO2 27 28 28   GLUCOSE 109* 134* 99  BUN 21 21 27*  CREATININE 0.73 0.75 0.76  CALCIUM 8.9 8.3* 8.4*   LFT Recent Labs    07/04/21 1400  PROT 6.7  ALBUMIN 3.7  AST 28  ALT 20  ALKPHOS 98  BILITOT 0.7    Studies/Results DG Chest Port 1 View  Result Date: 07/03/2021 CLINICAL DATA:  Shortness of breath EXAM: PORTABLE CHEST 1 VIEW COMPARISON:  11/28/2020 FINDINGS: Increased interstitial markings bilaterally, without a perihilar distribution. This appearance favors mild infection/pneumonia in this clinical setting, possibly atypical/viral. No definite pleural effusions. No pneumothorax. The heart is top-normal in size.  Thoracic aortic atherosclerosis. IMPRESSION: Increased interstitial markings, favoring mild infection/pneumonia, possibly atypical/viral. Electronically Signed   By: Julian Hy M.D.   On: 07/03/2021 19:57   DG Chest Port 1V same Day  Result Date: 07/03/2021 CLINICAL DATA:  Shortness of breath EXAM: PORTABLE CHEST  1 VIEW COMPARISON:  07/03/2021 FINDINGS: Increased interstitial markings. Biapical pleural parenchymal scarring at the medial lung apices. Small left pleural effusion. No pneumothorax. Cardiomegaly.  Thoracic aortic atherosclerosis. IMPRESSION: Increased interstitial markings, possibly reflecting mild interstitial edema versus atypical infection/pneumonia. Small left pleural effusion. Electronically Signed   By:  Julian Hy M.D.   On: 07/03/2021 23:25   ECHOCARDIOGRAM COMPLETE  Result Date: 07/04/2021    ECHOCARDIOGRAM REPORT   Patient Name:   Deborah [name] Date of Exam: 07/04/2021 Medical Rec #:  [id number]     Height:       68.0 in Accession #:    [id number]    Weight:       147.7 lb Date of Birth:  1925-11-10    BSA:          1.797 m Patient Age:    88 years      BP:           139/42 mmHg Patient Gender: F             HR:           60 bpm. Exam Location:  Inpatient Procedure: 2D Echo, Cardiac Doppler and Color Doppler Indications:    CHF-Acute Systolic [id number]  History:        Patient has prior history of Echocardiogram examinations, most                 recent 07/21/2019. CHF, CAD, COPD, Aortic Valve Disease,                 Arrythmias:Atrial Fibrillation; Risk Factors:Dyslipidemia and                 Former Smoker. Alzheimer disease.  Sonographer:    Darlina Sicilian RDCS Referring Phys: [id number] OLADAPO ADEFESO IMPRESSIONS  1. Left ventricular ejection fraction, by estimation, is 60 to 65%. The left ventricle has normal function. The left ventricle has no regional wall motion abnormalities. There is mild left ventricular hypertrophy of the basal-septal segment. Left ventricular diastolic function could not be evaluated. Elevated left ventricular end-diastolic pressure.  2. Right ventricular systolic function is normal. The right ventricular size is mildly enlarged. There is severely elevated pulmonary artery systolic pressure. The estimated right ventricular systolic pressure is 123456 mmHg.  3. Right atrial size was moderately dilated.  4. The mitral valve is degenerative. Mild mitral valve regurgitation. No evidence of mitral stenosis.  5. Tricuspid valve regurgitation is moderate.  6. The aortic valve is calcified. Aortic valve regurgitation is not visualized. Moderate aortic valve stenosis. Aortic valve area, by VTI measures 0.95 cm. Aortic valve mean gradient measures 19.2 mmHg. Aortic valve Vmax measures 2.94  m/s.  7. The inferior vena cava is dilated in size with <50% respiratory variability, suggesting right atrial pressure of 15 mmHg.  8. Compared to study dated 07/21/19, the mean AVG has increased from 53mmHg to 65mmhg, Vmax has increased from 2.36m/s to 2.11m/s and DI has decreased from 0.48 to 0.34. AV stenosis is now moderate. FINDINGS  Left Ventricle: Left ventricular ejection fraction, by estimation, is 60 to 65%. The left ventricle has normal function. The left ventricle has no regional wall motion abnormalities. The left ventricular internal cavity size was normal in size. There is  mild left ventricular hypertrophy of the basal-septal segment. Left ventricular diastolic function could not be evaluated. Elevated left ventricular end-diastolic pressure. Right Ventricle: The right ventricular size is mildly enlarged. No increase in right ventricular wall thickness. Right ventricular  systolic function is normal. There is severely elevated pulmonary artery systolic pressure. The tricuspid regurgitant velocity is 3.68 m/s, and with an assumed right atrial pressure of 15 mmHg, the estimated right ventricular systolic pressure is 123456 mmHg. Left Atrium: Left atrial size was normal in size. Right Atrium: Right atrial size was moderately dilated. Pericardium: There is no evidence of pericardial effusion. Mitral Valve: The mitral valve is degenerative in appearance. There is mild thickening of the mitral valve leaflet(s). Mild to moderate mitral annular calcification. Mild mitral valve regurgitation. No evidence of mitral valve stenosis. Tricuspid Valve: The tricuspid valve is normal in structure. Tricuspid valve regurgitation is moderate . No evidence of tricuspid stenosis. Aortic Valve: The aortic valve is calcified. Aortic valve regurgitation is not visualized. Moderate aortic stenosis is present. Aortic valve mean gradient measures 19.2 mmHg. Aortic valve peak gradient measures 34.7 mmHg. Aortic valve area, by VTI  measures 0.95 cm. Pulmonic Valve: The pulmonic valve was normal in structure. Pulmonic valve regurgitation is trivial. No evidence of pulmonic stenosis. Aorta: The aortic root is normal in size and structure. Venous: The inferior vena cava is dilated in size with less than 50% respiratory variability, suggesting right atrial pressure of 15 mmHg. IAS/Shunts: No atrial level shunt detected by color flow Doppler.  LEFT VENTRICLE PLAX 2D LVIDd:         4.60 cm      Diastology LVIDs:         3.20 cm      LV e' medial:    7.78 cm/s LV PW:         0.80 cm      LV E/e' medial:  16.6 LV IVS:        1.20 cm      LV e' lateral:   7.18 cm/s LVOT diam:     1.90 cm      LV E/e' lateral: 18.0 LV SV:         71 LV SV Index:   40 LVOT Area:     2.84 cm  LV Volumes (MOD) LV vol d, MOD A2C: 107.0 ml LV vol d, MOD A4C: 106.0 ml LV vol s, MOD A2C: 36.6 ml LV vol s, MOD A4C: 22.3 ml LV SV MOD A2C:     70.4 ml LV SV MOD A4C:     106.0 ml LV SV MOD BP:      77.8 ml RIGHT VENTRICLE RV S prime:     11.10 cm/s TAPSE (M-mode): 1.9 cm LEFT ATRIUM             Index        RIGHT ATRIUM           Index LA diam:        4.70 cm 2.62 cm/m   RA Area:     23.00 cm LA Vol (A2C):   72.9 ml 40.57 ml/m  RA Volume:   62.10 ml  34.56 ml/m LA Vol (A4C):   51.1 ml 28.44 ml/m LA Biplane Vol: 61.2 ml 34.06 ml/m  AORTIC VALVE AV Area (Vmax):    1.61 cm AV Area (Vmean):   1.02 cm AV Area (VTI):     0.95 cm AV Vmax:           294.40 cm/s AV Vmean:          207.600 cm/s AV VTI:            0.747 m AV Peak Grad:  34.7 mmHg AV Mean Grad:      19.2 mmHg LVOT Vmax:         167.67 cm/s LVOT Vmean:        74.500 cm/s LVOT VTI:          0.250 m LVOT/AV VTI ratio: 0.34  AORTA Ao Root diam: 2.80 cm Ao Asc diam:  3.00 cm MITRAL VALVE                TRICUSPID VALVE MV Area (PHT):  5.23 cm    TR Peak grad:   54.2 mmHg MV Area (plan): 4.58 cm    TR Vmax:        368.00 cm/s MV Decel Time:  145 msec MV E velocity: 129.50 cm/s  SHUNTS                              Systemic VTI:  0.25 m                             Systemic Diam: 1.90 cm Fransico Him MD Electronically signed by Fransico Him MD Signature Date/Time: 07/04/2021/3:26:30 PM    Final     Assessment  88 y.o. female with a history of Alzheimer's disease, asthma, atrial fibrillation on eliquis, chronic diastolic heart failure, COPD, CAD s/p stent in 2009, seizures, HLD, and osteoarthritis who presented to the ED with shortness of breath and found to have significant macrocytic anemia with a Hgb of 5.8, MCV 106, and plt 565. Heme Occult negative.   Macrocytic Anemia: She received 2 units of blood with increase in Hgb to 9.2 and has remained stable in the 9 range. Iron studies revealed Iron 248, TIBC 248, Ferritin 346, B12 1532, Folate 23.9, although these were drawn post transfusion they could be skewed. Upon review of Epic she has had chronically elevated ferritin for the last year. There is no overt identified cause of her anemia and would need to do an EGD but will defer to outpatient since asymptomatic and active respiratory issues at this time and spoke with daughter regarding this. We will attempt to check iron studies as an add on prior to transfusion and if unable will do a follow up CBC with iron studies after discharge. Pending results may need workup for iron overload.  Thrombocytosis: Likely elevated in the setting of infection however could possibly be due to presenting anemia/blood loss.     Plan / Recommendations  Iron and ferritin levels prior to transfusion (add on) if possible. Monitor CBC and any new symptoms.  Consider EGD, Colonoscopy, and f/u CBC and iron studies post discharge for anemia evaluation.    LOS: 2 days    07/05/2021, 11:29 AM   Venetia Night, MSN, FNP-BC, AGACNP-BC [facility]

## 2021-07-05 NOTE — Progress Notes (Signed)
Initial Nutrition Assessment  DOCUMENTATION CODES:   Not applicable  INTERVENTION:  - Encourage adequate PO intake  - Recommend diet liberalization from heart healthy to a 2gm sodium diet; spoke with MD who agrees  - Ensure Enlive po BID, each supplement provides 350 kcal and 20 grams of protein.  NUTRITION DIAGNOSIS:   Increased nutrient needs related to acute illness as evidenced by estimated needs.  GOAL:   Patient will meet greater than or equal to 90% of their needs  MONITOR:   PO intake, Supplement acceptance, Diet advancement, Labs, Weight trends, I & O's  REASON FOR ASSESSMENT:   Malnutrition Screening Tool    ASSESSMENT:   Pt admitted from [facility] d/t SOB secondary to acute respiratory failure with hypoxia. PMH includes Alzheimer's dementia, CAD, afib, CHF, HTN, HLD, COPD and seizure disorder.  RD working remotely.   Per RN, sister is present but does not know about pt's nutrition history and daughter not present at bedside. Per review of chart, her daughter reports no recent nausea/vomiting, dysphagia, or early satiety and an improvement in her PO intake since first day of admission. Noted meal completions: 25-50% x3 recorded meals. Will order nutrition supplements and continue to follow up with pt as appropriate.  Pt noted to have a 5% weight loss x1 year which is not significant for time frame.   Medications: lasix, synthroid, protonix  Labs: BUN 27  UOP: 1150 mL x24 hours I/O's: +1.7L since admission  NUTRITION - FOCUSED PHYSICAL EXAM: RD working remotely- deferred to follow up  Diet Order:   Diet Order             Diet 2 gram sodium Room service appropriate? Yes; Fluid consistency: Thin  Diet effective now                   EDUCATION NEEDS:   No education needs have been identified at this time  Skin:  Skin Assessment: Reviewed RN Assessment  Last BM:  2/13- type 2  Height:   Ht Readings from Last 1 Encounters:  06/22/21  5\' 8"  (1.727 m)    Weight:   Wt Readings from Last 1 Encounters:  07/05/21 65.9 kg     BMI:  Body mass index is 22.09 kg/m.  Estimated Nutritional Needs:   Kcal:  1550-1750  Protein:  80-95g  Fluid:  >/=1.5L  07/07/21, RDN, LDN Clinical Nutrition

## 2021-07-06 DIAGNOSIS — D500 Iron deficiency anemia secondary to blood loss (chronic): Secondary | ICD-10-CM

## 2021-07-06 DIAGNOSIS — R0602 Shortness of breath: Secondary | ICD-10-CM

## 2021-07-06 LAB — BASIC METABOLIC PANEL
Anion gap: 12 (ref 5–15)
BUN: 32 mg/dL — ABNORMAL HIGH (ref 8–23)
CO2: 25 mmol/L (ref 22–32)
Calcium: 8.4 mg/dL — ABNORMAL LOW (ref 8.9–10.3)
Chloride: 103 mmol/L (ref 98–111)
Creatinine, Ser: 0.65 mg/dL (ref 0.44–1.00)
GFR, Estimated: >60 mL/min (ref >60)
Glucose, Bld: 99 mg/dL (ref 70–99)
Potassium: 3.9 mmol/L (ref 3.5–5.1)
Sodium: 140 mmol/L (ref 135–145)

## 2021-07-06 LAB — CBC
HCT: 27.2 % — ABNORMAL LOW (ref 36.0–46.0)
Hemoglobin: 8.4 g/dL — ABNORMAL LOW (ref 12.0–15.0)
MCH: 30.8 pg (ref 26.0–34.0)
MCHC: 30.9 g/dL (ref 30.0–36.0)
MCV: 99.6 fL (ref 80.0–100.0)
Platelets: 501 10*3/uL — ABNORMAL HIGH (ref 150–400)
RBC: 2.73 MIL/uL — ABNORMAL LOW (ref 3.87–5.11)
RDW: 19.9 % — ABNORMAL HIGH (ref 11.5–15.5)
WBC: 5.6 10*3/uL (ref 4.0–10.5)
nRBC: 0 % (ref 0.0–0.2)

## 2021-07-06 LAB — MAGNESIUM: Magnesium: 2.3 mg/dL (ref 1.7–2.4)

## 2021-07-06 MED ORDER — DIGOXIN 125 MCG PO TABS
0.1250 mg | ORAL_TABLET | Freq: Every day | ORAL | Status: DC
  Administered 2021-07-06 – 2021-07-07 (×2): 0.125 mg via ORAL
  Filled 2021-07-06 (×2): qty 1

## 2021-07-06 MED ORDER — DILTIAZEM HCL 30 MG PO TABS
30.0000 mg | ORAL_TABLET | Freq: Four times a day (QID) | ORAL | Status: DC
  Administered 2021-07-06 – 2021-07-07 (×5): 30 mg via ORAL
  Filled 2021-07-06 (×5): qty 1

## 2021-07-06 MED ORDER — FUROSEMIDE 10 MG/ML IJ SOLN
40.0000 mg | Freq: Once | INTRAMUSCULAR | Status: AC
  Administered 2021-07-06: 40 mg via INTRAVENOUS
  Filled 2021-07-06: qty 4

## 2021-07-06 MED ORDER — METHYLPREDNISOLONE SODIUM SUCC 125 MG IJ SOLR
60.0000 mg | Freq: Every day | INTRAMUSCULAR | Status: DC
  Administered 2021-07-06 – 2021-07-07 (×2): 60 mg via INTRAVENOUS
  Filled 2021-07-06 (×2): qty 2

## 2021-07-06 NOTE — Progress Notes (Signed)
PROGRESS NOTE  Deborah [name] ZOX:[id number] DOB: 01/31/1926 DOA: 07/03/2021 PCP: Marlowe Aschoff, FNP  Brief History:  88 year old female with a history of Alzheimer's dementia, coronary disease, permanent atrial fibrillation, diastolic CHF, hypertension, hyperlipidemia, COPD, seizure order presenting with shortness of breath that began on the morning of 07/03/2020.  Patient was given some Xanax with some improvement.  However she redeveloped shortness of breath again on the evening of 07/03/2021.  As result, the patient was brought to emergency department for further evaluation. The patient is a poor historian secondary to her Alzheimer's dementia.  History is from review of the medical record and speaking with the patient's daughter.  The patient resides at [facility].  At baseline, the patient is pleasantly confused and requires minimal assist with her ADLs including dressing, bathing, and going to the bathroom In speaking with the patient's daughter, it appears that the patient has had some shortness of breath for 2 to 3 days prior to admission.  There is no history of nausea, vomiting, diarrhea, abdominal pain.  There have not been any reports of hematochezia or melena.  However, the patient has been having coughing, chest congestion, and wheezing.  The patient has not been started any new medications recently. In the emergency department, the patient was noted to be hypoxic with oxygen saturation 86-80% on room air.  She was afebrile with heart rates 110-120.  She was placed on 4 L nasal cannula with saturation up to 100%.  WBC 4.2, hemoglobin 5.8, platelets vomiting 65,000.  BMP shows sodium 136, potassium 4.3, bicarbonate 27, BUN 21, creatinine 0.73.  GI was consulted for her drop in Hgb and may consider doing EGD on 2/16 if respiratory status is stable     Assessment and Plan: * Acute respiratory failure with hypoxia (HCC)- (present on admission) Presented with saturation 86% RA and  tachypnea Stable on 4L>>2L>>1.5 Due to CHF  Wean oxygen for saturation >92% Check PCT <0.10  Symptomatic anemia- (present on admission) Baseline Hgb 12 Presented with Hgb 5.8 2 units PRBC ordered Consult GI even though FOBT negative as the patient had hemoglobin 12.4 back in September 01, 2020 Serum B12--1532 Folic acid--23.9 -appreciate GI input>>possible EGD 2/16 if resp status stable  Permanent atrial fibrillation (HCC)- (present on admission) Patient had RVR at the time of admission Now rate controlled Temporarily holding apixaban due to drop in hemoglobin Continue digoxin Restart diltiazem  Major neurocognitive disorder (HCC)- (present on admission) She is at risk for hospital delirium  Hypothyroidism- (present on admission) Continue synthroid  COPD exacerbation (HCC)- (present on admission) Start Xopenex and Atrovent Start Pulmicort Start IV solumedrol  Hypotension Midodrine  Acute on chronic diastolic CHF (congestive heart failure) (HCC) remains fluid overloaded clinically Continue IV furosemide Daily weights--NEG 4 lbs Accurate I's and O's--incomplete 07/04/20 echo--EF 60-65%, no WMA, severe pulm HTN--69.2; mod TR         Status is: Inpatient Remains inpatient appropriate because: severity of illness requiring IV lasix and IV solumedrol   Family Communication: Daughter updated 2/14   Consultants: GI   Code Status:  DNR   DVT Prophylaxis:  SCDs     Procedures: As Listed in Progress Note Above   Antibiotics: Ceftriaxone 2/11>>2/12 Azithro 2/11>>2/12           Subjective: Patient denies fevers, chills, headache, chest pain, dyspnea, nausea, vomiting, diarrhea, abdominal pain, dysuria,    Objective: Vitals:   07/06/21 1700 07/06/21 1800 07/06/21 1807 07/06/21  1841  BP: (!) 130/50  (!) 119/45 (!) 122/51  Pulse: 65 74 74 70  Resp: 17 (!) 36 (!) 25 20  Temp:    97.8 F (36.6 C)  TempSrc:    Oral  SpO2: 97% 99% 96% 100%  Weight:         Intake/Output Summary (Last 24 hours) at 07/06/2021 1946 Last data filed at 07/06/2021 1440 Gross per 24 hour  Intake --  Output 1300 ml  Net -1300 ml   Weight change: -0.7 kg Exam:  General:  Pt is alert, follows commands appropriately, not in acute distress HEENT: No icterus, No thrush, No neck mass, [place]/AT Cardiovascular: IRRR, S1/S2, no rubs, no gallops Respiratory: bibasilar wheeze.  Bibasilar crackles. Abdomen: Soft/+BS, non tender, non distended, no guarding Extremities: trace LE edema, No lymphangitis, No petechiae, No rashes, no synovitis   Data Reviewed: I have personally reviewed following labs and imaging studies Basic Metabolic Panel: Recent Labs  Lab 07/03/21 2137 07/04/21 1400 07/05/21 0418 07/06/21 0458  NA 136 136 139 140  K 4.3 4.4 4.2 3.9  CL 102 100 100 103  CO2 27 28 28 25   GLUCOSE 109* 134* 99 99  BUN 21 21 27* 32*  CREATININE 0.73 0.75 0.76 0.65  CALCIUM 8.9 8.3* 8.4* 8.4*  MG  --  2.3   2.3  --  2.3  PHOS  --  4.2  --   --    Liver Function Tests: Recent Labs  Lab 07/04/21 1400  AST 28  ALT 20  ALKPHOS 98  BILITOT 0.7  PROT 6.7  ALBUMIN 3.7   No results for input(s): LIPASE, AMYLASE in the last 168 hours. No results for input(s): AMMONIA in the last 168 hours. Coagulation Profile: No results for input(s): INR, PROTIME in the last 168 hours. CBC: Recent Labs  Lab 07/03/21 2137 07/04/21 1400 07/05/21 0418 07/06/21 0458  WBC 4.2 5.8   5.7 6.7 5.6  NEUTROABS 3.3  --   --   --   HGB 5.8* 9.9*   9.2* 9.3* 8.4*  HCT 18.4* 30.1*   28.7* 29.1* 27.2*  MCV 106.4* 98.0   98.3 100.7* 99.6  PLT 565* 647*   635* 514* 501*   Cardiac Enzymes: No results for input(s): CKTOTAL, CKMB, CKMBINDEX, TROPONINI in the last 168 hours. BNP: Invalid input(s): POCBNP CBG: No results for input(s): GLUCAP in the last 168 hours. HbA1C: No results for input(s): HGBA1C in the last 72 hours. Urine analysis:    Component Value Date/Time    COLORURINE YELLOW 10/24/2020 0520   APPEARANCEUR HAZY (A) 10/24/2020 0520   LABSPEC 1.018 10/24/2020 0520   PHURINE 6.0 10/24/2020 0520   GLUCOSEU NEGATIVE 10/24/2020 0520   HGBUR NEGATIVE 10/24/2020 0520   BILIRUBINUR NEGATIVE 10/24/2020 0520   KETONESUR NEGATIVE 10/24/2020 0520   PROTEINUR NEGATIVE 10/24/2020 0520   UROBILINOGEN 0.2 04/28/2012 1830   NITRITE POSITIVE (A) 10/24/2020 0520   LEUKOCYTESUR LARGE (A) 10/24/2020 0520   Sepsis Labs: @LABRCNTIP (procalcitonin:4,lacticidven:4) ) Recent Results (from the past 240 hour(s))  Culture, blood (routine x 2) Call MD if unable to obtain prior to antibiotics being given     Status: None (Preliminary result)   Collection Time: 07/03/21 10:54 PM   Specimen: Left Antecubital; Blood  Result Value Ref Range Status   Specimen Description LEFT ANTECUBITAL  Final   Special Requests   Final    BOTTLES DRAWN AEROBIC ONLY Blood Culture adequate volume   Culture   Final  NO GROWTH 2 DAYS Performed at [facility], [address], [place], [place] [address]    Report Status PENDING  Incomplete  Resp Panel by RT-PCR (Flu A&B, Covid) Nasopharyngeal Swab     Status: None   Collection Time: 07/03/21 11:16 PM   Specimen: Nasopharyngeal Swab; Nasopharyngeal(NP) swabs in vial transport medium  Result Value Ref Range Status   SARS Coronavirus 2 by RT PCR NEGATIVE NEGATIVE Final    Comment: (NOTE) SARS-CoV-2 target nucleic acids are NOT DETECTED.  The SARS-CoV-2 RNA is generally detectable in upper respiratory specimens during the acute phase of infection. The lowest concentration of SARS-CoV-2 viral copies this assay can detect is 138 copies/mL. A negative result does not preclude SARS-Cov-2 infection and should not be used as the sole basis for treatment or other patient management decisions. A negative result may occur with  improper specimen collection/handling, submission of specimen other than nasopharyngeal swab, presence of viral  mutation(s) within the areas targeted by this assay, and inadequate number of viral copies(<138 copies/mL). A negative result must be combined with clinical observations, patient history, and epidemiological information. The expected result is Negative.  Fact Sheet for Patients:  BloggerCourse.com  Fact Sheet for Healthcare Providers:  SeriousBroker.it  This test is no t yet approved or cleared by the [place] FDA and  has been authorized for detection and/or diagnosis of SARS-CoV-2 by FDA under an Emergency Use Authorization (EUA). This EUA will remain  in effect (meaning this test can be used) for the duration of the COVID-19 declaration under Section 564(b)(1) of the Act, 21 U.S.C.section 360bbb-3(b)(1), unless the authorization is terminated  or revoked sooner.       Influenza A by PCR NEGATIVE NEGATIVE Final   Influenza B by PCR NEGATIVE NEGATIVE Final    Comment: (NOTE) The Xpert Xpress SARS-CoV-2/FLU/RSV plus assay is intended as an aid in the diagnosis of influenza from Nasopharyngeal swab specimens and should not be used as a sole basis for treatment. Nasal washings and aspirates are unacceptable for Xpert Xpress SARS-CoV-2/FLU/RSV testing.  Fact Sheet for Patients: BloggerCourse.com  Fact Sheet for Healthcare Providers: SeriousBroker.it  This test is not yet approved or cleared by the [place] FDA and has been authorized for detection and/or diagnosis of SARS-CoV-2 by FDA under an Emergency Use Authorization (EUA). This EUA will remain in effect (meaning this test can be used) for the duration of the COVID-19 declaration under Section 564(b)(1) of the Act, 21 U.S.C. section 360bbb-3(b)(1), unless the authorization is terminated or revoked.  Performed at [facility], [address] [place], [place] [address]   Culture, blood (routine x 2) Call MD if  unable to obtain prior to antibiotics being given     Status: None (Preliminary result)   Collection Time: 07/04/21  1:59 PM   Specimen: Left Antecubital; Blood  Result Value Ref Range Status   Specimen Description   Final    LEFT ANTECUBITAL BOTTLES DRAWN AEROBIC AND ANAEROBIC   Special Requests Blood Culture adequate volume  Final   Culture   Final    NO GROWTH 2 DAYS Performed at [facility], [address] [place], [place] [address]    Report Status PENDING  Incomplete  MRSA Next Gen by PCR, Nasal     Status: None   Collection Time: 07/05/21  1:47 PM   Specimen: Nasal Mucosa; Nasal Swab  Result Value Ref Range Status   MRSA by PCR Next Gen NOT DETECTED NOT DETECTED Final  Comment: (NOTE) The GeneXpert MRSA Assay (FDA approved for NASAL specimens only), is one component of a comprehensive MRSA colonization surveillance program. It is not intended to diagnose MRSA infection nor to guide or monitor treatment for MRSA infections. Test performance is not FDA approved in patients less than 86 years old. Performed at [facility], [address] [place], [place] [id number]      Scheduled Meds:  budesonide (PULMICORT) nebulizer solution  0.5 mg Nebulization BID   Chlorhexidine Gluconate Cloth  6 each Topical Q0600   dextromethorphan-guaiFENesin  1 tablet Oral BID   digoxin  0.125 mg Oral Daily   diltiazem  30 mg Oral Q6H   feeding supplement  237 mL Oral BID BM   furosemide  40 mg Intravenous Daily   ipratropium  0.5 mg Nebulization BID   levalbuterol  0.63 mg Nebulization BID   levothyroxine  25 mcg Oral Q0600   methylPREDNISolone (SOLU-MEDROL) injection  60 mg Intravenous Daily   midodrine  2.5 mg Oral BID WC   montelukast  10 mg Oral Daily   pantoprazole  40 mg Oral Daily   Continuous Infusions:  sodium chloride      Procedures/Studies: CT HEAD WO CONTRAST (5MM)  Result Date: 06/23/2021 CLINICAL DATA:  Unwitnessed fall. EXAM: CT HEAD WITHOUT CONTRAST CT CERVICAL  SPINE WITHOUT CONTRAST TECHNIQUE: Multidetector CT imaging of the head and cervical spine was performed following the standard protocol without intravenous contrast. Multiplanar CT image reconstructions of the cervical spine were also generated. RADIATION DOSE REDUCTION: This exam was performed according to the departmental dose-optimization program which includes automated exposure control, adjustment of the mA and/or kV according to patient size and/or use of iterative reconstruction technique. COMPARISON:  None. FINDINGS: CT HEAD FINDINGS Brain: No evidence of acute infarction, hemorrhage, hydrocephalus, extra-axial collection or mass lesion/mass effect. Again seen is moderate diffuse atrophy and mild periventricular white matter hypodensity, likely chronic small vessel ischemic change. Vascular: No hyperdense vessel or unexpected calcification. Skull: Normal. Negative for fracture or focal lesion. Sinuses/Orbits: No acute finding. Other: None. CT CERVICAL SPINE FINDINGS Alignment: Normal. Skull base and vertebrae: No acute fracture. No primary bone lesion or focal pathologic process. Bones are osteopenic. Soft tissues and spinal canal: No prevertebral fluid or swelling. No visible canal hematoma. Disc levels: There is intervertebral disc space narrowing and endplate osteophyte formation at C4-C5 and C6-C7 compatible with degenerative change. No significant central canal or neural foraminal stenosis at any level. Upper chest: Negative. Other: Examination is mildly technically limited secondary to motion artifact. IMPRESSION: 1.  No acute intracranial process. 2. No acute fracture or traumatic subluxation of the cervical spine. Examination is mildly technically limited secondary to motion artifact. 3. Stable moderate diffuse brain atrophy and mild chronic small vessel ischemic change. Electronically Signed   By: Darliss CheneyAmy  Guttmann M.D.   On: 06/23/2021 00:45   CT CERVICAL SPINE WO CONTRAST  Result Date:  06/23/2021 CLINICAL DATA:  Unwitnessed fall. EXAM: CT HEAD WITHOUT CONTRAST CT CERVICAL SPINE WITHOUT CONTRAST TECHNIQUE: Multidetector CT imaging of the head and cervical spine was performed following the standard protocol without intravenous contrast. Multiplanar CT image reconstructions of the cervical spine were also generated. RADIATION DOSE REDUCTION: This exam was performed according to the departmental dose-optimization program which includes automated exposure control, adjustment of the mA and/or kV according to patient size and/or use of iterative reconstruction technique. COMPARISON:  None. FINDINGS: CT HEAD FINDINGS Brain: No evidence of acute infarction, hemorrhage, hydrocephalus, extra-axial collection or mass lesion/mass  effect. Again seen is moderate diffuse atrophy and mild periventricular white matter hypodensity, likely chronic small vessel ischemic change. Vascular: No hyperdense vessel or unexpected calcification. Skull: Normal. Negative for fracture or focal lesion. Sinuses/Orbits: No acute finding. Other: None. CT CERVICAL SPINE FINDINGS Alignment: Normal. Skull base and vertebrae: No acute fracture. No primary bone lesion or focal pathologic process. Bones are osteopenic. Soft tissues and spinal canal: No prevertebral fluid or swelling. No visible canal hematoma. Disc levels: There is intervertebral disc space narrowing and endplate osteophyte formation at C4-C5 and C6-C7 compatible with degenerative change. No significant central canal or neural foraminal stenosis at any level. Upper chest: Negative. Other: Examination is mildly technically limited secondary to motion artifact. IMPRESSION: 1.  No acute intracranial process. 2. No acute fracture or traumatic subluxation of the cervical spine. Examination is mildly technically limited secondary to motion artifact. 3. Stable moderate diffuse brain atrophy and mild chronic small vessel ischemic change. Electronically Signed   By: Darliss CheneyAmy  Guttmann  M.D.   On: 06/23/2021 00:45   DG Chest Port 1 View  Result Date: 07/03/2021 CLINICAL DATA:  Shortness of breath EXAM: PORTABLE CHEST 1 VIEW COMPARISON:  11/28/2020 FINDINGS: Increased interstitial markings bilaterally, without a perihilar distribution. This appearance favors mild infection/pneumonia in this clinical setting, possibly atypical/viral. No definite pleural effusions. No pneumothorax. The heart is top-normal in size.  Thoracic aortic atherosclerosis. IMPRESSION: Increased interstitial markings, favoring mild infection/pneumonia, possibly atypical/viral. Electronically Signed   By: Charline BillsSriyesh  Krishnan M.D.   On: 07/03/2021 19:57   DG Chest Port 1V same Day  Result Date: 07/03/2021 CLINICAL DATA:  Shortness of breath EXAM: PORTABLE CHEST 1 VIEW COMPARISON:  07/03/2021 FINDINGS: Increased interstitial markings. Biapical pleural parenchymal scarring at the medial lung apices. Small left pleural effusion. No pneumothorax. Cardiomegaly.  Thoracic aortic atherosclerosis. IMPRESSION: Increased interstitial markings, possibly reflecting mild interstitial edema versus atypical infection/pneumonia. Small left pleural effusion. Electronically Signed   By: Charline BillsSriyesh  Krishnan M.D.   On: 07/03/2021 23:25   ECHOCARDIOGRAM COMPLETE  Result Date: 07/04/2021    ECHOCARDIOGRAM REPORT   Patient Name:   Gertie FeySARA C Chirino Date of Exam: 07/04/2021 Medical Rec #:  [id number]     Height:       68.0 in Accession #:    [id number]    Weight:       147.7 lb Date of Birth:  Oct 15, 1925    BSA:          1.797 m Patient Age:    89 years      BP:           139/42 mmHg Patient Gender: F             HR:           60 bpm. Exam Location:  Inpatient Procedure: 2D Echo, Cardiac Doppler and Color Doppler Indications:    CHF-Acute Systolic I50.21  History:        Patient has prior history of Echocardiogram examinations, most                 recent 07/21/2019. CHF, CAD, COPD, Aortic Valve Disease,                 Arrythmias:Atrial Fibrillation;  Risk Factors:Dyslipidemia and                 Former Smoker. Alzheimer disease.  Sonographer:    Leta Junglingiffany Cooper RDCS Referring Phys: [id number] OLADAPO ADEFESO IMPRESSIONS  1. Left ventricular ejection fraction, by estimation,  is 60 to 65%. The left ventricle has normal function. The left ventricle has no regional wall motion abnormalities. There is mild left ventricular hypertrophy of the basal-septal segment. Left ventricular diastolic function could not be evaluated. Elevated left ventricular end-diastolic pressure.  2. Right ventricular systolic function is normal. The right ventricular size is mildly enlarged. There is severely elevated pulmonary artery systolic pressure. The estimated right ventricular systolic pressure is 69.2 mmHg.  3. Right atrial size was moderately dilated.  4. The mitral valve is degenerative. Mild mitral valve regurgitation. No evidence of mitral stenosis.  5. Tricuspid valve regurgitation is moderate.  6. The aortic valve is calcified. Aortic valve regurgitation is not visualized. Moderate aortic valve stenosis. Aortic valve area, by VTI measures 0.95 cm. Aortic valve mean gradient measures 19.2 mmHg. Aortic valve Vmax measures 2.94 m/s.  7. The inferior vena cava is dilated in size with <50% respiratory variability, suggesting right atrial pressure of 15 mmHg.  8. Compared to study dated 07/21/19, the mean AVG has increased from to , Vmax has increased from 2.52m/s to 2.60m/s and DI has decreased from 0.48 to 0.34. AV stenosis is now moderate. FINDINGS  Left Ventricle: Left ventricular ejection fraction, by estimation, is 60 to 65%. The left ventricle has normal function. The left ventricle has no regional wall motion abnormalities. The left ventricular internal cavity size was normal in size. There is  mild left ventricular hypertrophy of the basal-septal segment. Left ventricular diastolic function could not be evaluated. Elevated left ventricular end-diastolic pressure.  Right Ventricle: The right ventricular size is mildly enlarged. No increase in right ventricular wall thickness. Right ventricular systolic function is normal. There is severely elevated pulmonary artery systolic pressure. The tricuspid regurgitant velocity is 3.68 m/s, and with an assumed right atrial pressure of 15 mmHg, the estimated right ventricular systolic pressure is 69.2 mmHg. Left Atrium: Left atrial size was normal in size. Right Atrium: Right atrial size was moderately dilated. Pericardium: There is no evidence of pericardial effusion. Mitral Valve: The mitral valve is degenerative in appearance. There is mild thickening of the mitral valve leaflet(s). Mild to moderate mitral annular calcification. Mild mitral valve regurgitation. No evidence of mitral valve stenosis. Tricuspid Valve: The tricuspid valve is normal in structure. Tricuspid valve regurgitation is moderate . No evidence of tricuspid stenosis. Aortic Valve: The aortic valve is calcified. Aortic valve regurgitation is not visualized. Moderate aortic stenosis is present. Aortic valve mean gradient measures 19.2 mmHg. Aortic valve peak gradient measures 34.7 mmHg. Aortic valve area, by VTI measures 0.95 cm. Pulmonic Valve: The pulmonic valve was normal in structure. Pulmonic valve regurgitation is trivial. No evidence of pulmonic stenosis. Aorta: The aortic root is normal in size and structure. Venous: The inferior vena cava is dilated in size with less than 50% respiratory variability, suggesting right atrial pressure of 15 mmHg. IAS/Shunts: No atrial level shunt detected by color flow Doppler.  LEFT VENTRICLE PLAX 2D LVIDd:         4.60 cm      Diastology LVIDs:         3.20 cm      LV e' medial:    7.78 cm/s LV PW:         0.80 cm      LV E/e' medial:  16.6 LV IVS:        1.20 cm      LV e' lateral:   7.18 cm/s LVOT diam:     1.90 cm  LV E/e' lateral: 18.0 LV SV:         71 LV SV Index:   40 LVOT Area:     2.84 cm  LV Volumes (MOD) LV  vol d, MOD A2C: 107.0 ml LV vol d, MOD A4C: 106.0 ml LV vol s, MOD A2C: 36.6 ml LV vol s, MOD A4C: 22.3 ml LV SV MOD A2C:     70.4 ml LV SV MOD A4C:     106.0 ml LV SV MOD BP:      77.8 ml RIGHT VENTRICLE RV S prime:     11.10 cm/s TAPSE (M-mode): 1.9 cm LEFT ATRIUM             Index        RIGHT ATRIUM           Index LA diam:        4.70 cm 2.62 cm/m   RA Area:     23.00 cm LA Vol (A2C):   72.9 ml 40.57 ml/m  RA Volume:   62.10 ml  34.56 ml/m LA Vol (A4C):   51.1 ml 28.44 ml/m LA Biplane Vol: 61.2 ml 34.06 ml/m  AORTIC VALVE AV Area (Vmax):    1.61 cm AV Area (Vmean):   1.02 cm AV Area (VTI):     0.95 cm AV Vmax:           294.40 cm/s AV Vmean:          207.600 cm/s AV VTI:            0.747 m AV Peak Grad:      34.7 mmHg AV Mean Grad:      19.2 mmHg LVOT Vmax:         167.67 cm/s LVOT Vmean:        74.500 cm/s LVOT VTI:          0.250 m LVOT/AV VTI ratio: 0.34  AORTA Ao Root diam: 2.80 cm Ao Asc diam:  3.00 cm MITRAL VALVE                TRICUSPID VALVE MV Area (PHT):  5.23 cm    TR Peak grad:   54.2 mmHg MV Area (plan): 4.58 cm    TR Vmax:        368.00 cm/s MV Decel Time:  145 msec MV E velocity: 129.50 cm/s  SHUNTS                             Systemic VTI:  0.25 m                             Systemic Diam: 1.90 cm Armanda Magic MD Electronically signed by Armanda Magic MD Signature Date/Time: 07/04/2021/3:26:30 PM    Final     [name], DO  [facility]  If 7PM-7AM, please contact night-coverage [email or web address] Password [facility] 07/06/2021, 7:46 PM   LOS: 3 days

## 2021-07-06 NOTE — Progress Notes (Signed)
Gastroenterology Progress Note   Referring Provider: No ref. provider found Primary Care Physician:  Lynnell Catalan, FNP Primary Gastroenterologist:  Dr.Rehman  Patient ID: Deborah [name]; [id number]; 1926/04/15    Subjective   Patient states she is feeling fine. She said she has not had any nausea or vomiting. She denies any abdominal pain. The patient appears to be much more confused and but pleasant this morning. Her sister and the bedside nurse provided some history this morning stating that th patient did have a bowel movement last night that was small but was not black and appeared to not have any blood. It was reported that she has had much more agitation yesterday afternoon and into this morning. She had a decrease in her appetite this morning and did not eat all of her meal but this was likely due to mental state.    Objective   Vital signs in last 24 hours Temp:  [96.4 F (35.8 C)-98.7 F (37.1 C)] 98.1 F (36.7 C) (02/14 0728) Pulse Rate:  [42-119] 110 (02/14 0500) Resp:  [12-24] 18 (02/14 0500) BP: (112-156)/(42-87) 156/65 (02/14 0500) SpO2:  [93 %-100 %] 100 % (02/14 0500) Weight:  [65.2 kg] 65.2 kg (02/14 0405) Last BM Date : 07/04/21   Physical Exam General:   Alert, NAD Head:  Normocephalic and atraumatic. Eyes:  No icterus, sclera clear. Heart:  S1, S2 present, no murmurs noted.  Lungs: Wheezing present on inspiration. Lungs clear to auscultation, diminished in the bases bilaterally. 2.5 lpm O2 Abdomen:  Golf ball sized, soft, reducible umbilical hernia. Bowel sounds present, soft, non-tender, non-distended. No HSM noted. No rebound or guarding. Extremities:  Non pitting edema to BLE Neurologic:  Alert and oriented to self. Skin:  Warm and dry, intact with bruising to BUE Psych:  Alert and cooperative, mild intermittent agitation  Intake/Output from previous day: 02/13 0701 - 02/14 0700 In: 600 [P.O.:600] Out: 1640 [Urine:1640] Intake/Output this  shift: No intake/output data recorded.  Lab Results  Recent Labs    07/04/21 1400 07/05/21 0418 07/06/21 0458  WBC 5.8   5.7 6.7 5.6  HGB 9.9*   9.2* 9.3* 8.4*  HCT 30.1*   28.7* 29.1* 27.2*  PLT 647*   635* 514* 501*   BMET Recent Labs    07/04/21 1400 07/05/21 0418 07/06/21 0458  NA 136 139 140  K 4.4 4.2 3.9  CL 100 100 103  CO2 28 28 25   GLUCOSE 134* 99 99  BUN 21 27* 32*  CREATININE 0.75 0.76 0.65  CALCIUM 8.3* 8.4* 8.4*   LFT Recent Labs    07/04/21 1400  PROT 6.7  ALBUMIN 3.7  AST 28  ALT 20  ALKPHOS 98  BILITOT 0.7    Studies/Results ECHOCARDIOGRAM COMPLETE  Result Date: 07/04/2021    ECHOCARDIOGRAM REPORT   Patient Name:   Deborah [name] Date of Exam: 07/04/2021 Medical Rec #:  [id number]     Height:       68.0 in Accession #:    [id number]    Weight:       147.7 lb Date of Birth:  1925/06/23    BSA:          1.797 m Patient Age:    15 years      BP:           139/42 mmHg Patient Gender: F             HR:  60 bpm. Exam Location:  Inpatient Procedure: 2D Echo, Cardiac Doppler and Color Doppler Indications:    CHF-Acute Systolic [id number]  History:        Patient has prior history of Echocardiogram examinations, most                 recent 07/21/2019. CHF, CAD, COPD, Aortic Valve Disease,                 Arrythmias:Atrial Fibrillation; Risk Factors:Dyslipidemia and                 Former Smoker. Alzheimer disease.  Sonographer:    Darlina Sicilian RDCS Referring Phys: [id number] OLADAPO ADEFESO IMPRESSIONS  1. Left ventricular ejection fraction, by estimation, is 60 to 65%. The left ventricle has normal function. The left ventricle has no regional wall motion abnormalities. There is mild left ventricular hypertrophy of the basal-septal segment. Left ventricular diastolic function could not be evaluated. Elevated left ventricular end-diastolic pressure.  2. Right ventricular systolic function is normal. The right ventricular size is mildly enlarged. There is severely  elevated pulmonary artery systolic pressure. The estimated right ventricular systolic pressure is 123456 mmHg.  3. Right atrial size was moderately dilated.  4. The mitral valve is degenerative. Mild mitral valve regurgitation. No evidence of mitral stenosis.  5. Tricuspid valve regurgitation is moderate.  6. The aortic valve is calcified. Aortic valve regurgitation is not visualized. Moderate aortic valve stenosis. Aortic valve area, by VTI measures 0.95 cm. Aortic valve mean gradient measures 19.2 mmHg. Aortic valve Vmax measures 2.94 m/s.  7. The inferior vena cava is dilated in size with <50% respiratory variability, suggesting right atrial pressure of 15 mmHg.  8. Compared to study dated 07/21/19, the mean AVG has increased from 32mmHg to 41mmhg, Vmax has increased from 2.33m/s to 2.42m/s and DI has decreased from 0.48 to 0.34. AV stenosis is now moderate. FINDINGS  Left Ventricle: Left ventricular ejection fraction, by estimation, is 60 to 65%. The left ventricle has normal function. The left ventricle has no regional wall motion abnormalities. The left ventricular internal cavity size was normal in size. There is  mild left ventricular hypertrophy of the basal-septal segment. Left ventricular diastolic function could not be evaluated. Elevated left ventricular end-diastolic pressure. Right Ventricle: The right ventricular size is mildly enlarged. No increase in right ventricular wall thickness. Right ventricular systolic function is normal. There is severely elevated pulmonary artery systolic pressure. The tricuspid regurgitant velocity is 3.68 m/s, and with an assumed right atrial pressure of 15 mmHg, the estimated right ventricular systolic pressure is 123456 mmHg. Left Atrium: Left atrial size was normal in size. Right Atrium: Right atrial size was moderately dilated. Pericardium: There is no evidence of pericardial effusion. Mitral Valve: The mitral valve is degenerative in appearance. There is mild thickening  of the mitral valve leaflet(s). Mild to moderate mitral annular calcification. Mild mitral valve regurgitation. No evidence of mitral valve stenosis. Tricuspid Valve: The tricuspid valve is normal in structure. Tricuspid valve regurgitation is moderate . No evidence of tricuspid stenosis. Aortic Valve: The aortic valve is calcified. Aortic valve regurgitation is not visualized. Moderate aortic stenosis is present. Aortic valve mean gradient measures 19.2 mmHg. Aortic valve peak gradient measures 34.7 mmHg. Aortic valve area, by VTI measures 0.95 cm. Pulmonic Valve: The pulmonic valve was normal in structure. Pulmonic valve regurgitation is trivial. No evidence of pulmonic stenosis. Aorta: The aortic root is normal in size and structure. Venous: The inferior vena cava is  dilated in size with less than 50% respiratory variability, suggesting right atrial pressure of 15 mmHg. IAS/Shunts: No atrial level shunt detected by color flow Doppler.  LEFT VENTRICLE PLAX 2D LVIDd:         4.60 cm      Diastology LVIDs:         3.20 cm      LV e' medial:    7.78 cm/s LV PW:         0.80 cm      LV E/e' medial:  16.6 LV IVS:        1.20 cm      LV e' lateral:   7.18 cm/s LVOT diam:     1.90 cm      LV E/e' lateral: 18.0 LV SV:         71 LV SV Index:   40 LVOT Area:     2.84 cm  LV Volumes (MOD) LV vol d, MOD A2C: 107.0 ml LV vol d, MOD A4C: 106.0 ml LV vol s, MOD A2C: 36.6 ml LV vol s, MOD A4C: 22.3 ml LV SV MOD A2C:     70.4 ml LV SV MOD A4C:     106.0 ml LV SV MOD BP:      77.8 ml RIGHT VENTRICLE RV S prime:     11.10 cm/s TAPSE (M-mode): 1.9 cm LEFT ATRIUM             Index        RIGHT ATRIUM           Index LA diam:        4.70 cm 2.62 cm/m   RA Area:     23.00 cm LA Vol (A2C):   72.9 ml 40.57 ml/m  RA Volume:   62.10 ml  34.56 ml/m LA Vol (A4C):   51.1 ml 28.44 ml/m LA Biplane Vol: 61.2 ml 34.06 ml/m  AORTIC VALVE AV Area (Vmax):    1.61 cm AV Area (Vmean):   1.02 cm AV Area (VTI):     0.95 cm AV Vmax:            294.40 cm/s AV Vmean:          207.600 cm/s AV VTI:            0.747 m AV Peak Grad:      34.7 mmHg AV Mean Grad:      19.2 mmHg LVOT Vmax:         167.67 cm/s LVOT Vmean:        74.500 cm/s LVOT VTI:          0.250 m LVOT/AV VTI ratio: 0.34  AORTA Ao Root diam: 2.80 cm Ao Asc diam:  3.00 cm MITRAL VALVE                TRICUSPID VALVE MV Area (PHT):  5.23 cm    TR Peak grad:   54.2 mmHg MV Area (plan): 4.58 cm    TR Vmax:        368.00 cm/s MV Decel Time:  145 msec MV E velocity: 129.50 cm/s  SHUNTS                             Systemic VTI:  0.25 m  Systemic Diam: 1.90 cm Fransico Him MD Electronically signed by Fransico Him MD Signature Date/Time: 07/04/2021/3:26:30 PM    Final     Assessment  88 y.o. female with a history of Asthma, Afib previously on eliquis, chronic diastoic heart failure, COPD, CAD s/p stent in 2009, Alzheimer's, seizures, HLD, and osteoarthritis  who presented to the ED with shortness of breath and found to have significant macrocytic anemia and thrombocytosis with Hgb 5.8, MCV 106, and plt 565 but heme occult negative.   Normocytic Anemia: She has received 2 units total this hospitalization. Hgb post transfusion has been in the 9 range however it is 8.4 today. Iron studies were added on to labs from yesterday (unable to get iron studies from admission labs). Iron down to 181 from 248, and TIBC saturation down from 60%to 47% and ferritin elevated at 417 (closer to labs from 1 year). B12 1532 and Folate 23.9, however these were from post transfusion. Will likely discuss risk vs benefit of doing EGD/Colonoscopy outpatient if no symptoms occur. Pending results of follow up CBC and iron studies may need work up for iron overload however if iron and TIBC continue to trend down then this is less likely.   Thrombocytosis: Remains elevated today at 501 however has been down trending. Still likely in the setting of recent blood loss and/or infection.   Updated Manuela Schwartz  (daughter and POA) on plan and answered any questions.   Plan / Recommendations  Continue to follow CBC daily - only transfuse for Hgb <8 Continue Pantoprazole 40mg  daily Due to age and respiratory status and no overt bleeding will hold on EGD and colonoscopy for now, may consider outpatient. Will follow up CBC and iron studies outpatient due to recent blood transfusions.     LOS: 3 days    07/06/2021, 8:25 AM   Venetia Night, MSN, FNP-BC, AGACNP-BC [facility]

## 2021-07-07 LAB — CBC
HCT: 27.9 % — ABNORMAL LOW (ref 36.0–46.0)
Hemoglobin: 9 g/dL — ABNORMAL LOW (ref 12.0–15.0)
MCH: 32.5 pg (ref 26.0–34.0)
MCHC: 32.3 g/dL (ref 30.0–36.0)
MCV: 100.7 fL — ABNORMAL HIGH (ref 80.0–100.0)
Platelets: 585 10*3/uL — ABNORMAL HIGH (ref 150–400)
RBC: 2.77 MIL/uL — ABNORMAL LOW (ref 3.87–5.11)
RDW: 19.2 % — ABNORMAL HIGH (ref 11.5–15.5)
WBC: 3 10*3/uL — ABNORMAL LOW (ref 4.0–10.5)
nRBC: 0 % (ref 0.0–0.2)

## 2021-07-07 LAB — BASIC METABOLIC PANEL
Anion gap: 13 (ref 5–15)
BUN: 26 mg/dL — ABNORMAL HIGH (ref 8–23)
CO2: 29 mmol/L (ref 22–32)
Calcium: 8.6 mg/dL — ABNORMAL LOW (ref 8.9–10.3)
Chloride: 98 mmol/L (ref 98–111)
Creatinine, Ser: 0.62 mg/dL (ref 0.44–1.00)
GFR, Estimated: >60 mL/min (ref >60)
Glucose, Bld: 134 mg/dL — ABNORMAL HIGH (ref 70–99)
Potassium: 3.6 mmol/L (ref 3.5–5.1)
Sodium: 140 mmol/L (ref 135–145)

## 2021-07-07 LAB — MAGNESIUM: Magnesium: 2.2 mg/dL (ref 1.7–2.4)

## 2021-07-07 MED ORDER — FUROSEMIDE 20 MG PO TABS: 20.0000 mg | ORAL_TABLET | Freq: Every day | ORAL | Status: DC

## 2021-07-07 MED ORDER — PANTOPRAZOLE SODIUM 40 MG PO TBEC: 40.0000 mg | DELAYED_RELEASE_TABLET | Freq: Every day | ORAL | Status: DC

## 2021-07-07 MED ORDER — ALBUTEROL SULFATE (2.5 MG/3ML) 0.083% IN NEBU: 2.5000 mg | INHALATION_SOLUTION | RESPIRATORY_TRACT | 2 refills | Status: DC | PRN

## 2021-07-07 MED ORDER — PREDNISONE 10 MG PO TABS: ORAL_TABLET | ORAL | 0 refills | Status: DC

## 2021-07-07 NOTE — Evaluation (Signed)
Physical Therapy Evaluation Patient Details Name: Deborah [name] MRN: [id number] DOB: 09-28-1925 Today's Date: 07/07/2021  History of Present Illness  Deborah [name] is a 88 y.o. female with medical history significant of  Alzheimer's dementia, coronary artery disease, permanent atrial fibrillation, diastolic CHF, hypertension, hyperlipidemia, COPD and seizure order who presents to the emergency department via EMS due to shortness of breath.  Patient was unable to provide history, history was obtained from ED physician and daughter at bedside.  Per report, patient had shortness of breath this morning and this improved with Xanax, patient had a recurrence of the shortness of breath in the evening, EMS was activated and patient was taken to the ED for cough evaluation and wheezing.  There was no report of fever, chills, chest pain, headache, nausea or vomiting.   Clinical Impression  Patient functioning near baseline for functional mobility and gait demonstrating slow labored cadence during ambulation in room/hallway without loss of balance, required repeated verbal/tactile cueing mostly for safety and patient tolerated sitting up in chair after therapy.  PLAN:  Patient to be discharged home today and discharged from acute physical therapy to care of nursing for ambulation as tolerated for length of stay with recommendations stated below         Recommendations for follow up therapy are one component of a multi-disciplinary discharge planning process, led by the attending physician.  Recommendations may be updated based on patient status, additional functional criteria and insurance authorization.  Follow Up Recommendations Home health PT    Assistance Recommended at Discharge Intermittent Supervision/Assistance  Patient can return home with the following  A little help with walking and/or transfers;A little help with bathing/dressing/bathroom;Help with stairs or ramp for entrance;Assistance with  cooking/housework    Equipment Recommendations None recommended by PT  Recommendations for Other Services       Functional Status Assessment Patient has had a recent decline in their functional status and demonstrates the ability to make significant improvements in function in a reasonable and predictable amount of time.     Precautions / Restrictions Precautions Precautions: Fall Restrictions Weight Bearing Restrictions: No      Mobility  Bed Mobility Overal bed mobility: Needs Assistance Bed Mobility: Supine to Sit     Supine to sit: Min guard          Transfers Overall transfer level: Needs assistance Equipment used: Rolling walker (2 wheels) Transfers: Sit to/from Stand, Bed to chair/wheelchair/BSC Sit to Stand: Min guard, Min assist   Step pivot transfers: Min guard, Min assist            Ambulation/Gait Ambulation/Gait assistance: Min guard, Min assist Gait Distance (Feet): 65 Feet Assistive device: Rolling walker (2 wheels) Gait Pattern/deviations: Decreased step length - left, Decreased stance time - right, Decreased stride length Gait velocity: decreased     General Gait Details: slow slightly  labored cadence requiring increased time to make turns and occasional verbal/tactile cueing for safety  Stairs            Wheelchair Mobility    Modified Rankin (Stroke Patients Only)       Balance Overall balance assessment: Needs assistance Sitting-balance support: Feet supported Sitting balance-Leahy Scale: Good Sitting balance - Comments: seated at EOB   Standing balance support: During functional activity, Bilateral upper extremity supported Standing balance-Leahy Scale: Fair Standing balance comment: using RW  Pertinent Vitals/Pain Pain Assessment Pain Assessment: No/denies pain    Home Living Family/patient expects to be discharged to:: Assisted living                 Home  Equipment: Conservation officer, nature (2 wheels);Cane - single point      Prior Function Prior Level of Function : Needs assist       Physical Assist : Mobility (physical);ADLs (physical) Mobility (physical): Bed mobility;Transfers;Gait;Stairs   Mobility Comments: household ambulator using RW ADLs Comments: assisted by ALF staff     Hand Dominance   Dominant Hand: Right    Extremity/Trunk Assessment   Upper Extremity Assessment Upper Extremity Assessment: Overall WFL for tasks assessed    Lower Extremity Assessment Lower Extremity Assessment: Generalized weakness    Cervical / Trunk Assessment Cervical / Trunk Assessment: Normal  Communication   Communication: No difficulties  Cognition Arousal/Alertness: Awake/alert Behavior During Therapy: WFL for tasks assessed/performed Overall Cognitive Status: History of cognitive impairments - at baseline                                          General Comments      Exercises     Assessment/Plan    PT Assessment All further PT needs can be met in the next venue of care  PT Problem List Decreased strength;Decreased activity tolerance;Decreased balance;Decreased mobility       PT Treatment Interventions      PT Goals (Current goals can be found in the Care Plan section)  Acute Rehab PT Goals Patient Stated Goal: return home PT Goal Formulation: With patient/family Time For Goal Achievement: 07/07/21 Potential to Achieve Goals: Good    Frequency       Co-evaluation               AM-PAC PT "6 Clicks" Mobility  Outcome Measure Help needed turning from your back to your side while in a flat bed without using bedrails?: None Help needed moving from lying on your back to sitting on the side of a flat bed without using bedrails?: A Little Help needed moving to and from a bed to a chair (including a wheelchair)?: A Little Help needed standing up from a chair using your arms (e.g., wheelchair or bedside  chair)?: A Little Help needed to walk in hospital room?: A Lot Help needed climbing 3-5 steps with a railing? : A Lot 6 Click Score: 17    End of Session   Activity Tolerance: Patient tolerated treatment well;Patient limited by fatigue Patient left: in chair;with call bell/phone within reach;with chair alarm set;with nursing/sitter in room Nurse Communication: Mobility status PT Visit Diagnosis: Unsteadiness on feet (R26.81);Other abnormalities of gait and mobility (R26.89);Muscle weakness (generalized) (M62.81)    Time: 8768-1157 PT Time Calculation (min) (ACUTE ONLY): 21 min   Charges:   PT Evaluation $PT Eval Moderate Complexity: 1 Mod PT Treatments $Therapeutic Activity: 8-22 mins        4:17 PM, 07/07/21 Lonell Grandchild, MPT Physical Therapist with [facility] 336 [phone] office [phone] mobile phone

## 2021-07-07 NOTE — Consult Note (Signed)
WOC Nurse Consult Note: Reason for Consult:Unstageable pressure injury to right heel Wound type:Pressure Pressure Injury POA: Yes Measurement:3cm x 2cm with depth obscured by the presence of nonviable tissue (eschar) Wound bed:See above Drainage (amount, consistency, odor) None Periwound: intact Dressing procedure/placement/frequency: I will provide a conservative, but goal consistent POC for the wound using a daily application of an antimicrobial, nonadherent gauze (xeroform) for its antimicrobial property as well as to dry and maintain the eschar. Pressure redistribution heel boots are provided as is guidance for the placement of a sacral prophylactic silicone foam dressing for PI prevention.Turning and repositioning is in place per house protocol and the patient is using Derm[employer] therapeutic bed linen.  WOC nursing team will not follow, but will remain available to this patient, the nursing and medical teams.  Please re-consult if needed. Thanks, Ladona Mow, MSN, RN, GNP, [place]  Pager# [phone]

## 2021-07-07 NOTE — TOC Transition Note (Signed)
Transition of Care [facility]) - CM/SW Discharge Note   Patient Details  Name: Deborah [name] MRN: [id number] Date of Birth: 27-Jan-1926  Transition of Care [facility]) CM/SW Contact:  Annice Needy, LCSW Phone Number: 07/07/2021, 5:13 PM   Clinical Narrative:    Daughter, Ms. Sparks, notified of d/c. Discharge clinicals sent to facility and [facility] at facility advised of d/c. Facility to transport patient. TOC signing off.     Barriers to Discharge: Continued Medical Work up   Patient Goals and CMS Choice Patient states their goals for this hospitalization and ongoing recovery are:: return to ALF   Choice offered to / list presented to : Adult Children  Discharge Placement                       Discharge Plan and Services In-house Referral: Clinical Social Work   Post Acute Care Choice: Resumption of Svcs/PTA Provider                    HH Arranged: RN HH Agency: [facility] Date [facility] Agency Contacted: 07/05/21 Time HH Agency Contacted: [phone] Representative spoke with at [facility] Agency: Maralyn Sago  Social Determinants of Health (SDOH) Interventions     Readmission Risk Interventions Readmission Risk Prevention Plan 07/05/2021  Transportation Screening Complete  HRI or Home Care Consult Complete  Social Work Consult for Recovery Care Planning/Counseling Complete  Palliative Care Screening Not Applicable  Medication Review Oceanographer) Complete  Some recent data might be hidden

## 2021-07-07 NOTE — NC FL2 (Signed)
[place] LEVEL OF CARE SCREENING TOOL     IDENTIFICATION  Patient Name: Deborah [name] Birthdate: July 24, 1925 Sex: female Admission Date (Current Location): 07/03/2021  [facility] and [place] Number:  [employer] and Address:  [place] [address], [place]      Provider Number: [id number]  Attending Physician Name and Address:  Kathie Dike, MD  Relative Name and Phone Number:       Current Level of Care: Hospital Recommended Level of Care: Assisted Living Facility Prior Approval Number:    Date Approved/Denied:   PASRR Number:    Discharge Plan: Other (Comment) (ALF)    Current Diagnoses: Patient Active Problem List   Diagnosis Date Noted   Iron deficiency anemia due to chronic blood loss    SOB (shortness of breath)    Elevated brain natriuretic peptide (BNP) level 07/04/2021   Elevated MCV 07/04/2021   Thrombocytosis 07/04/2021   Acute respiratory failure with hypoxia (HCC) 07/04/2021   Hypothyroidism 07/04/2021   GERD (gastroesophageal reflux disease) 07/04/2021   Major neurocognitive disorder ([place]) 07/04/2021   Symptomatic anemia [id number]   Acute metabolic encephalopathy 0000000   Generalized weakness 09/01/2020   Hypercalcemia 09/01/2020   Dehydration 09/01/2020   Essential hypertension 09/01/2020   Acute pulmonary embolism without acute cor pulmonale ([place]) 07/20/2019   Sepsis due to undetermined organism ([place]) 07/18/2019   AKI (acute kidney injury) ([place]) 07/18/2019   Acute cystitis without hematuria    Sepsis ([place]) 07/17/2019   COVID-19 virus infection 07/17/2019   Chest pain 09/02/2017   Compression fracture of body of thoracic vertebra ([place]) 06/22/2017   Atrial fibrillation with rapid ventricular response (HCC)    Screening for colorectal cancer 03/24/2017   Hemorrhoids 03/24/2017   Itching in the vaginal area 03/24/2017   Vulval thrush 03/24/2017   Hypoxemia    Alzheimer disease ([place])  09/13/2016   Seizures ([place]) 09/13/2016   Hypokalemia 09/13/2016   COPD exacerbation ([place]) 09/13/2016   Hypotension 04/22/2012   CAD S/P percutaneous coronary angioplasty    COPD (chronic obstructive pulmonary disease) (HCC)    Permanent atrial fibrillation ([place]) 02/10/2009   Acute on chronic diastolic CHF (congestive heart failure) ([place]) 02/10/2009    Orientation RESPIRATION BLADDER Height & Weight     Self    Incontinent Weight: 142 lb 10.2 oz (64.7 kg) Height:  5\' 8"  (172.7 cm)  BEHAVIORAL SYMPTOMS/MOOD NEUROLOGICAL BOWEL NUTRITION STATUS    Convulsions/Seizures (history) Incontinent    AMBULATORY STATUS COMMUNICATION OF NEEDS Skin   Limited Assist Verbally                         Personal Care Assistance Level of Assistance  Bathing, Feeding, Dressing Bathing Assistance: Limited assistance Feeding assistance: Limited assistance Dressing Assistance: Limited assistance     Functional Limitations Info  Sight, Hearing, Speech Sight Info: Adequate Hearing Info: Adequate Speech Info: Adequate    SPECIAL CARE FACTORS FREQUENCY  PT (By licensed PT), OT (By licensed OT)     PT Frequency: 3x/week OT Frequency: 3x/week            Contractures      Additional Factors Info  Code Status, Allergies, Psychotropic Code Status Info: DNR Allergies Info: Ivp Dye (iodinated Contrast Media), Omnipaque (iohexol), Sulfa Antibiotics, Amiodarone, Carbamazepine, Dilaudid (hydromorphone), Tramadol Psychotropic Info: Xanax, Zoloft         Current Medications (07/07/2021):  This is the current hospital active medication  list Current Facility-Administered Medications  Medication Dose Route Frequency Provider Last Rate Last Admin   0.9 %  sodium chloride infusion  10 mL/hr Intravenous Once Adefeso, Oladapo, DO       acetaminophen (TYLENOL) tablet 650 mg  650 mg Oral Q6H PRN Adefeso, Oladapo, DO       Or   acetaminophen (TYLENOL) suppository 650 mg  650 mg Rectal Q6H PRN Adefeso,  Oladapo, DO       budesonide (PULMICORT) nebulizer solution 0.5 mg  0.5 mg Nebulization BID Tat, David, MD   0.5 mg at 07/07/21 0805   Chlorhexidine Gluconate Cloth 2 % PADS 6 each  6 each Topical Q0600 Tat, Shanon Brow, MD   6 each at 07/07/21 0707   dextromethorphan-guaiFENesin ([place] DM) 30-600 MG per 12 hr tablet 1 tablet  1 tablet Oral BID Adefeso, Oladapo, DO   1 tablet at 07/07/21 0831   digoxin (LANOXIN) tablet 0.125 mg  0.125 mg Oral Daily Tat, David, MD   0.125 mg at 07/07/21 0831   diltiazem (CARDIZEM) tablet 30 mg  30 mg Oral Q6H Tat, Shanon Brow, MD   30 mg at 07/07/21 1457   feeding supplement (ENSURE ENLIVE / ENSURE PLUS) liquid 237 mL  237 mL Oral BID BM Tat, Shanon Brow, MD   237 mL at 07/07/21 1457   furosemide (LASIX) injection 40 mg  40 mg Intravenous Daily Tat, David, MD   40 mg at 07/07/21 0831   guaiFENesin-dextromethorphan (ROBITUSSIN DM) 100-10 MG/5ML syrup 5 mL  5 mL Oral Q4H PRN Adefeso, Oladapo, DO       ipratropium (ATROVENT) nebulizer solution 0.5 mg  0.5 mg Nebulization BID Tat, David, MD   0.5 mg at 07/07/21 0756   levalbuterol (XOPENEX) nebulizer solution 0.63 mg  0.63 mg Nebulization BID Tat, Shanon Brow, MD   0.63 mg at 07/07/21 0756   levalbuterol (XOPENEX) nebulizer solution 0.63 mg  0.63 mg Nebulization Q6H PRN Tat, Shanon Brow, MD       levothyroxine (SYNTHROID) tablet 25 mcg  25 mcg Oral Q0600 Adefeso, Oladapo, DO   25 mcg at 07/07/21 0703   methylPREDNISolone sodium succinate (SOLU-MEDROL) 125 mg/2 mL injection 60 mg  60 mg Intravenous Daily Tat, Shanon Brow, MD   60 mg at 07/07/21 0832   midodrine (PROAMATINE) tablet 2.5 mg  2.5 mg Oral BID WC Adefeso, Oladapo, DO   2.5 mg at 07/07/21 0831   montelukast (SINGULAIR) tablet 10 mg  10 mg Oral Daily Adefeso, Oladapo, DO   10 mg at 07/07/21 0831   morphine (PF) 2 MG/ML injection 1 mg  1 mg Intravenous Q4H PRN Adefeso, Oladapo, DO   1 mg at 07/07/21 1516   pantoprazole (PROTONIX) EC tablet 40 mg  40 mg Oral Daily Adefeso, Oladapo, DO   40 mg at  07/07/21 0831     Discharge Medications:  TAKE these medications     albuterol (2.5 MG/3ML) 0.083% nebulizer solution Commonly known as: PROVENTIL Take 3 mLs (2.5 mg total) by nebulization every 4 (four) hours as needed for wheezing or shortness of breath.    ALPRAZolam 0.25 MG tablet Commonly known as: XANAX Take 1 tablet (0.25 mg total) by mouth every 8 (eight) hours as needed for anxiety.    D-5000 125 MCG (5000 UT) Tabs Generic drug: Cholecalciferol Take 5,000 Units by mouth daily.    digoxin 0.125 MG tablet Commonly known as: LANOXIN Take 1 tablet (0.125 mg total) by mouth every evening.    diltiazem 120 MG 24 hr capsule  Commonly known as: TIAZAC Take 120 mg by mouth daily.    docusate sodium 100 MG capsule Commonly known as: COLACE Take 100 mg by mouth at bedtime.    famotidine 20 MG tablet Commonly known as: PEPCID Take 1 tablet (20 mg total) by mouth at bedtime.    fluticasone 50 MCG/ACT nasal spray Commonly known as: FLONASE Place 2 sprays into both nostrils daily.    furosemide 20 MG tablet Commonly known as: LASIX Take 1 tablet (20 mg total) by mouth daily. What changed: how much to take    ICaps MV Tabs Take 1 tablet by mouth in the morning and at bedtime.    isosorbide mononitrate 30 MG 24 hr tablet Commonly known as: IMDUR Take 0.5 tablets (15 mg total) by mouth daily.    levETIRAcetam 250 MG tablet Commonly known as: KEPPRA Take 125-250 mg by mouth See admin instructions. One tablet at 0900 and half a tablet at 1700    levothyroxine 25 MCG tablet Commonly known as: SYNTHROID Take 25 mcg by mouth daily before breakfast.    melatonin 5 MG Tabs Take 5 mg by mouth at bedtime.    midodrine 2.5 MG tablet Commonly known as: PROAMATINE Take 1 tablet (2.5 mg total) by mouth 2 (two) times daily with a meal. The dosing can be titrated down to once daily when her systolic blood pressure is consistently in the 100s.    montelukast 10 MG  tablet Commonly known as: SINGULAIR Take 10 mg by mouth daily.    pantoprazole 40 MG tablet Commonly known as: PROTONIX Take 1 tablet (40 mg total) by mouth daily. Start taking on: July 08, 2021    potassium chloride 10 MEQ tablet Commonly known as: KLOR-CON M Take 10 mEq by mouth every other day.    predniSONE 10 MG tablet Commonly known as: DELTASONE Take 40mg  po daily for 2 days then 30mg  daily for 2 days then 20mg  daily for 2 days then 10mg  daily for 2 days then stop    sertraline 25 MG tablet Commonly known as: ZOLOFT Take 25 mg by mouth daily.    Trelegy Ellipta 100-62.5-25 MCG/ACT Aepb Generic drug: Fluticasone-Umeclidin-Vilant Inhale 1 puff into the lungs daily.     Relevant Imaging Results:  Relevant Lab Results:   Additional Information HHPT/OT/RN  Keven Osborn, Clydene Pugh, LCSW

## 2021-07-07 NOTE — Progress Notes (Signed)
Subjective: Patient states that she is doing much better today. Was able to come off of her supplemental O2. She denies abdominal pain, nausea or vomiting. Has not had a BM today. Last one was 8 pm last night, still without rectal bleeding or melena. Family member at bedside states that patient's appetite has not been great since admission, typically eats well at baseline.   Objective: Vital signs in last 24 hours: Temp:  [97.8 F (36.6 C)-98.3 F (36.8 C)] 97.8 F (36.6 C) (02/15 0536) Pulse Rate:  [52-119] 52 (02/15 0536) Resp:  [16-36] 19 (02/15 0536) BP: (101-146)/(45-81) 101/52 (02/15 0536) SpO2:  [90 %-100 %] 98 % (02/15 0806) Weight:  [64.7 kg-65.2 kg] 64.7 kg (02/15 0536) Last BM Date : 07/04/21 General:   Alert and oriented, pleasant Head:  Normocephalic and atraumatic. Eyes:  No icterus, sclera clear. Conjuctiva pink.  Mouth:  Without lesions, mucosa pink and moist.  Heart:  S1, S2 present, no murmurs noted.  Lungs: Clear to auscultation bilaterally, without wheezing, rales, or rhonchi.  Abdomen:  Bowel sounds present, soft, non-tender, non-distended. No HSM or hernias noted. No rebound or guarding. No masses appreciated  Msk:  Symmetrical without gross deformities. Normal posture. Pulses:  Normal pulses noted. Extremities:  Without clubbing or edema. Neurologic:  Alert and  oriented x4;  grossly normal neurologically. Skin:  Warm and dry, intact without significant lesions.  Psych:  Alert and cooperative. Normal mood and affect.  Intake/Output from previous day: 02/14 0701 - 02/15 0700 In: -  Out: 2050 [Urine:2050] Intake/Output this shift: No intake/output data recorded.  Lab Results: Recent Labs    07/04/21 1400 07/05/21 0418 07/06/21 0458  WBC 5.8   5.7 6.7 5.6  HGB 9.9*   9.2* 9.3* 8.4*  HCT 30.1*   28.7* 29.1* 27.2*  PLT 647*   635* 514* 501*   BMET Recent Labs    07/05/21 0418 07/06/21 0458 07/07/21 0559  NA 139 140 140  K 4.2 3.9 3.6  CL 100  103 98  CO2 28 25 29   GLUCOSE 99 99 134*  BUN 27* 32* 26*  CREATININE 0.76 0.65 0.62  CALCIUM 8.4* 8.4* 8.6*   LFT Recent Labs    07/04/21 1400  PROT 6.7  ALBUMIN 3.7  AST 28  ALT 20  ALKPHOS 98  BILITOT 0.7   Assessment: Deborah [name] is a 88 y.o. female with a history of Asthma, Afib previously on eliquis, chronic diastoic heart failure, COPD, CAD s/p stent in 2009, Alzheimer's, seizures, HLD, and osteoarthritis  who presented to the ED with shortness of breath, found to have significant macrocytic anemia and thrombocytosis with Hgb 5.8, MCV 106, and plt 565, FOBT negative.   Anemia: appears to be normocytic at this time, though initially macrocytic on admission with hgb of 5.8, now improved to 9 s/p 2 units PRBCs. No reports of melena or hematochezia, no abdominal pain. Iron studies with Iron 181, TIBC 384, saturation 47% and ferritin 417, though these were evaluated post transfusion. Etiology is unclear at this time and difficult to determine if patient had actual iron deficiency given timing of iron studies. Notably, ferritin appears to have been elevated for atleast the past year, per review of EMR. BUN slightly elevated over the past 2 days. Daughter reports patient has been on a baby ASA for years but no other NSAIDs. Recommend EGD and possibly colonoscopy for further evaluation, patient and daughter at bedside prefer endoscopic evaluation to be done on outpatient basis,  once patient is completely over her acute illness which I think is reasonable given hgb has stabilized and she is without overt GI bleeding.  Respiratory status improving as patient is no longer on supplemental O2 today.  Plan: Trend H&H daily, transfuse for hgb <7 Continue PPI daily Consider EGD/colonoscopy as outpatient  Monitor for overt GI bleeding   LOS: 4 days    07/07/2021, 9:10 AM   Fidelia Cathers L. Alver Sorrow, MSN, APRN, AGNP-C Adult-Gerontology Nurse Practitioner [facility]

## 2021-07-07 NOTE — Discharge Summary (Signed)
Physician Discharge Summary   Patient: Deborah [name] MRN: [id number] DOB: June 07, 1925  Admit date:     07/03/2021  Discharge date: 07/07/21  Discharge Physician: Kathie Dike   PCP: Lynnell Catalan, FNP   Recommendations at discharge:    Follow up with GI for outpatient endoscopy/colonoscopy as indicated Repeat CBC and BMET in 1 week  Discharge Diagnoses: Principal Problem:   Acute respiratory failure with hypoxia ([place]) Active Problems:   Permanent atrial fibrillation (HCC)   Acute on chronic diastolic CHF (congestive heart failure) (HCC)   COPD exacerbation (HCC)   Hypothyroidism   Major neurocognitive disorder (HCC)   Symptomatic anemia   Iron deficiency anemia due to chronic blood loss   SOB (shortness of breath)  Resolved Problems:   Atypical pneumonia   Hospital Course: 88 year old female with a history of Alzheimer's dementia, coronary disease, permanent atrial fibrillation, diastolic CHF, hypertension, hyperlipidemia, COPD, seizure order presenting with shortness of breath that began on the morning of 07/03/2020.  Patient was given some Xanax with some improvement.  However she redeveloped shortness of breath again on the evening of 07/03/2021.  As result, the patient was brought to emergency department for further evaluation. The patient is a poor historian secondary to her Alzheimer's dementia.  History is from review of the medical record and speaking with the patient's daughter.  The patient resides at [facility].  At baseline, the patient is pleasantly confused and requires minimal assist with her ADLs including dressing, bathing, and going to the bathroom In speaking with the patient's daughter, it appears that the patient has had some shortness of breath for 2 to 3 days prior to admission.  There is no history of nausea, vomiting, diarrhea, abdominal pain.  There have not been any reports of hematochezia or melena.  However, the patient has been having coughing, chest  congestion, and wheezing.  The patient has not been started any new medications recently. In the emergency department, the patient was noted to be hypoxic with oxygen saturation 86-80% on room air.  She was afebrile with heart rates 110-120.  She was placed on 4 L nasal cannula with saturation up to 100%.  WBC 4.2, hemoglobin 5.8, platelets vomiting 65,000.  BMP shows sodium 136, potassium 4.3, bicarbonate 27, BUN 21, creatinine 0.73.  GI was consulted for her drop in Hgb and may consider doing EGD on 2/16 if respiratory status is stable  Assessment and Plan: * Acute respiratory failure with hypoxia ([place])- (present on admission) Presented with saturation 86% RA and tachypnea Stable on 4L>>2L>>1.5>>RA Due to CHF  Wean oxygen for saturation >92% Check PCT <0.10 She is now has stable oxygen saturations on room air, at rest and on exertion  Symptomatic anemia- (present on admission) Baseline Hgb 12 Presented with Hgb 5.8 2 units PRBC ordered Consult GI even though FOBT negative as the patient had hemoglobin 12.4 back in September 01, 2020 Serum [id number] Folic [id number] -appreciate GI input, considering outpatient EGD/colonoscopy Her stools have been brown in color Hgb has been stable post transfusion at 8-9 range  Major neurocognitive disorder ([place])- (present on admission) She is at risk for hospital delirium  Hypothyroidism- (present on admission) Continue synthroid  COPD exacerbation ([place])- (present on admission) Treated with solumedrol, now transitioned to prednisone taper She was treated with bronchodilators Resume trelegy on discharge  Hypotension Midodrine  Acute on chronic diastolic CHF (congestive heart failure) ([place]) Diuresed with IV lasix and appears to be euvolemic now Daily weights--NEG 5 lbs since admission Accurate I's  and O's--incomplete 07/04/20 echo--EF 60-65%, no WMA, severe pulm HTN--69.2; mod TR Transitioned back to oral lasix, maintenance dose increased from  10mg  daily to 20mg  daily  Permanent atrial fibrillation ([place])- (present on admission) Patient had RVR at the time of admission Now rate controlled Continue digoxin and diltiazem Would continue to hold apixiban for now until patient can have GI tract evaluated, since there was concern for GI bleeding            Consultants: GI Procedures performed:   Disposition: Assisted living Diet recommendation:  Discharge Diet Orders (From admission, onward)     Start     Ordered   07/07/21 0000  Diet - low sodium heart healthy        07/07/21 1552           Cardiac diet  DISCHARGE MEDICATION: Allergies as of 07/07/2021       Reactions   Ivp Dye [iodinated Contrast Media] Shortness Of Breath   ??   Omnipaque [iohexol] Shortness Of Breath, Other (See Comments)   Short of breath with chest tightness after IV injection in CT, pt was fine when she left department but developed symptoms in parking lot and went to emergency department.   Sulfa Antibiotics Shortness Of Breath   Amiodarone    Carbamazepine    REACTION: toxemia   Dilaudid [hydromorphone] Nausea And Vomiting   Tramadol Other (See Comments)   confusion        Medication List     STOP taking these medications    aspirin 81 MG EC tablet Commonly known as: aspirin EC   oxyCODONE-acetaminophen 5-325 MG tablet Commonly known as: Percocet       TAKE these medications    albuterol (2.5 MG/3ML) 0.083% nebulizer solution Commonly known as: PROVENTIL Take 3 mLs (2.5 mg total) by nebulization every 4 (four) hours as needed for wheezing or shortness of breath.   ALPRAZolam 0.25 MG tablet Commonly known as: XANAX Take 1 tablet (0.25 mg total) by mouth every 8 (eight) hours as needed for anxiety.   D-5000 125 MCG (5000 UT) Tabs Generic drug: Cholecalciferol Take 5,000 Units by mouth daily.   digoxin 0.125 MG tablet Commonly known as: LANOXIN Take 1 tablet (0.125 mg total) by mouth every evening.    diltiazem 120 MG 24 hr capsule Commonly known as: TIAZAC Take 120 mg by mouth daily.   docusate sodium 100 MG capsule Commonly known as: COLACE Take 100 mg by mouth at bedtime.   famotidine 20 MG tablet Commonly known as: PEPCID Take 1 tablet (20 mg total) by mouth at bedtime.   fluticasone 50 MCG/ACT nasal spray Commonly known as: FLONASE Place 2 sprays into both nostrils daily.   furosemide 20 MG tablet Commonly known as: LASIX Take 1 tablet (20 mg total) by mouth daily. What changed: how much to take   ICaps MV Tabs Take 1 tablet by mouth in the morning and at bedtime.   isosorbide mononitrate 30 MG 24 hr tablet Commonly known as: IMDUR Take 0.5 tablets (15 mg total) by mouth daily.   levETIRAcetam 250 MG tablet Commonly known as: KEPPRA Take 125-250 mg by mouth See admin instructions. One tablet at 0900 and half a tablet at 1700   levothyroxine 25 MCG tablet Commonly known as: SYNTHROID Take 25 mcg by mouth daily before breakfast.   melatonin 5 MG Tabs Take 5 mg by mouth at bedtime.   midodrine 2.5 MG tablet Commonly known as: PROAMATINE Take 1 tablet (  2.5 mg total) by mouth 2 (two) times daily with a meal. The dosing can be titrated down to once daily when her systolic blood pressure is consistently in the 100s.   montelukast 10 MG tablet Commonly known as: SINGULAIR Take 10 mg by mouth daily.   pantoprazole 40 MG tablet Commonly known as: PROTONIX Take 1 tablet (40 mg total) by mouth daily. Start taking on: July 08, 2021   potassium chloride 10 MEQ tablet Commonly known as: KLOR-CON M Take 10 mEq by mouth every other day.   predniSONE 10 MG tablet Commonly known as: DELTASONE Take 40mg  po daily for 2 days then 30mg  daily for 2 days then 20mg  daily for 2 days then 10mg  daily for 2 days then stop   sertraline 25 MG tablet Commonly known as: ZOLOFT Take 25 mg by mouth daily.   Trelegy Ellipta 100-62.5-25 MCG/ACT Aepb Generic drug:  Fluticasone-Umeclidin-Vilant Inhale 1 puff into the lungs daily.               Durable Medical Equipment  (From admission, onward)           Start     Ordered   07/07/21 1558  For home use only DME Nebulizer machine  Once       Question Answer Comment  Patient needs a nebulizer to treat with the following condition COPD (chronic obstructive pulmonary disease) ([place])   Length of Need Lifetime      07/07/21 1557              Discharge Care Instructions  (From admission, onward)           Start     Ordered   07/07/21 0000  Discharge wound care:       Comments: Right heel unstageable ulcer: Cleanse with NS, pat dry. Cover with size appropriate piece of folded xeroform gauze, top with dry gauze and secure with a few turns of Kerlix roll gauze. Place foot into [employer]. Change daily.   07/07/21 1552             Discharge Exam: Filed Weights   07/06/21 0405 07/06/21 1953 07/07/21 0536  Weight: 65.2 kg 65.2 kg 64.7 kg   General exam: Alert, awake, confused Respiratory system: Clear to auscultation. Respiratory effort normal. Cardiovascular system:irregular,  No murmurs, rubs, gallops. Gastrointestinal system: Abdomen is nondistended, soft and nontender. No organomegaly or masses felt. Normal bowel sounds heard. Central nervous system:  No focal neurological deficits. Extremities: No C/C/E, +pedal pulses Skin: No rashes, lesions or ulcers Psychiatry: confused, pleasant   Condition at discharge: good  The results of significant diagnostics from this hospitalization (including imaging, microbiology, ancillary and laboratory) are listed below for reference.   Imaging Studies: CT HEAD WO CONTRAST (5MM)  Result Date: 06/23/2021 CLINICAL DATA:  Unwitnessed fall. EXAM: CT HEAD WITHOUT CONTRAST CT CERVICAL SPINE WITHOUT CONTRAST TECHNIQUE: Multidetector CT imaging of the head and cervical spine was performed following the standard protocol without  intravenous contrast. Multiplanar CT image reconstructions of the cervical spine were also generated. RADIATION DOSE REDUCTION: This exam was performed according to the departmental dose-optimization program which includes automated exposure control, adjustment of the mA and/or kV according to patient size and/or use of iterative reconstruction technique. COMPARISON:  None. FINDINGS: CT HEAD FINDINGS Brain: No evidence of acute infarction, hemorrhage, hydrocephalus, extra-axial collection or mass lesion/mass effect. Again seen is moderate diffuse atrophy and mild periventricular white matter hypodensity, likely chronic small vessel ischemic change. Vascular: No  hyperdense vessel or unexpected calcification. Skull: Normal. Negative for fracture or focal lesion. Sinuses/Orbits: No acute finding. Other: None. CT CERVICAL SPINE FINDINGS Alignment: Normal. Skull base and vertebrae: No acute fracture. No primary bone lesion or focal pathologic process. Bones are osteopenic. Soft tissues and spinal canal: No prevertebral fluid or swelling. No visible canal hematoma. Disc levels: There is intervertebral disc space narrowing and endplate osteophyte formation at C4-C5 and C6-C7 compatible with degenerative change. No significant central canal or neural foraminal stenosis at any level. Upper chest: Negative. Other: Examination is mildly technically limited secondary to motion artifact. IMPRESSION: 1.  No acute intracranial process. 2. No acute fracture or traumatic subluxation of the cervical spine. Examination is mildly technically limited secondary to motion artifact. 3. Stable moderate diffuse brain atrophy and mild chronic small vessel ischemic change. Electronically Signed   By: Ronney Asters M.D.   On: 06/23/2021 00:45   CT CERVICAL SPINE WO CONTRAST  Result Date: 06/23/2021 CLINICAL DATA:  Unwitnessed fall. EXAM: CT HEAD WITHOUT CONTRAST CT CERVICAL SPINE WITHOUT CONTRAST TECHNIQUE: Multidetector CT imaging of the  head and cervical spine was performed following the standard protocol without intravenous contrast. Multiplanar CT image reconstructions of the cervical spine were also generated. RADIATION DOSE REDUCTION: This exam was performed according to the departmental dose-optimization program which includes automated exposure control, adjustment of the mA and/or kV according to patient size and/or use of iterative reconstruction technique. COMPARISON:  None. FINDINGS: CT HEAD FINDINGS Brain: No evidence of acute infarction, hemorrhage, hydrocephalus, extra-axial collection or mass lesion/mass effect. Again seen is moderate diffuse atrophy and mild periventricular white matter hypodensity, likely chronic small vessel ischemic change. Vascular: No hyperdense vessel or unexpected calcification. Skull: Normal. Negative for fracture or focal lesion. Sinuses/Orbits: No acute finding. Other: None. CT CERVICAL SPINE FINDINGS Alignment: Normal. Skull base and vertebrae: No acute fracture. No primary bone lesion or focal pathologic process. Bones are osteopenic. Soft tissues and spinal canal: No prevertebral fluid or swelling. No visible canal hematoma. Disc levels: There is intervertebral disc space narrowing and endplate osteophyte formation at C4-C5 and C6-C7 compatible with degenerative change. No significant central canal or neural foraminal stenosis at any level. Upper chest: Negative. Other: Examination is mildly technically limited secondary to motion artifact. IMPRESSION: 1.  No acute intracranial process. 2. No acute fracture or traumatic subluxation of the cervical spine. Examination is mildly technically limited secondary to motion artifact. 3. Stable moderate diffuse brain atrophy and mild chronic small vessel ischemic change. Electronically Signed   By: Ronney Asters M.D.   On: 06/23/2021 00:45   DG Chest Port 1 View  Result Date: 07/03/2021 CLINICAL DATA:  Shortness of breath EXAM: PORTABLE CHEST 1 VIEW COMPARISON:   11/28/2020 FINDINGS: Increased interstitial markings bilaterally, without a perihilar distribution. This appearance favors mild infection/pneumonia in this clinical setting, possibly atypical/viral. No definite pleural effusions. No pneumothorax. The heart is top-normal in size.  Thoracic aortic atherosclerosis. IMPRESSION: Increased interstitial markings, favoring mild infection/pneumonia, possibly atypical/viral. Electronically Signed   By: Julian Hy M.D.   On: 07/03/2021 19:57   DG Chest Port 1V same Day  Result Date: 07/03/2021 CLINICAL DATA:  Shortness of breath EXAM: PORTABLE CHEST 1 VIEW COMPARISON:  07/03/2021 FINDINGS: Increased interstitial markings. Biapical pleural parenchymal scarring at the medial lung apices. Small left pleural effusion. No pneumothorax. Cardiomegaly.  Thoracic aortic atherosclerosis. IMPRESSION: Increased interstitial markings, possibly reflecting mild interstitial edema versus atypical infection/pneumonia. Small left pleural effusion. Electronically Signed   By: Bertis Ruddy  [place] Pink M.D.   On: 07/03/2021 23:25   ECHOCARDIOGRAM COMPLETE  Result Date: 07/04/2021    ECHOCARDIOGRAM REPORT   Patient Name:   MATLYN SCRUTON Date of Exam: 07/04/2021 Medical Rec #:  [id number]     Height:       68.0 in Accession #:    [id number]    Weight:       147.7 lb Date of Birth:  03-30-1926    BSA:          1.797 m Patient Age:    88 years      BP:           139/42 mmHg Patient Gender: F             HR:           60 bpm. Exam Location:  Inpatient Procedure: 2D Echo, Cardiac Doppler and Color Doppler Indications:    CHF-Acute Systolic [id number]  History:        Patient has prior history of Echocardiogram examinations, most                 recent 07/21/2019. CHF, CAD, COPD, Aortic Valve Disease,                 Arrythmias:Atrial Fibrillation; Risk Factors:Dyslipidemia and                 Former Smoker. Alzheimer disease.  Sonographer:    Darlina Sicilian RDCS Referring Phys: [id number] OLADAPO  ADEFESO IMPRESSIONS  1. Left ventricular ejection fraction, by estimation, is 60 to 65%. The left ventricle has normal function. The left ventricle has no regional wall motion abnormalities. There is mild left ventricular hypertrophy of the basal-septal segment. Left ventricular diastolic function could not be evaluated. Elevated left ventricular end-diastolic pressure.  2. Right ventricular systolic function is normal. The right ventricular size is mildly enlarged. There is severely elevated pulmonary artery systolic pressure. The estimated right ventricular systolic pressure is 123456 mmHg.  3. Right atrial size was moderately dilated.  4. The mitral valve is degenerative. Mild mitral valve regurgitation. No evidence of mitral stenosis.  5. Tricuspid valve regurgitation is moderate.  6. The aortic valve is calcified. Aortic valve regurgitation is not visualized. Moderate aortic valve stenosis. Aortic valve area, by VTI measures 0.95 cm. Aortic valve mean gradient measures 19.2 mmHg. Aortic valve Vmax measures 2.94 m/s.  7. The inferior vena cava is dilated in size with <50% respiratory variability, suggesting right atrial pressure of 15 mmHg.  8. Compared to study dated 07/21/19, the mean AVG has increased from 40mmHg to 71mmhg, Vmax has increased from 2.23m/s to 2.36m/s and DI has decreased from 0.48 to 0.34. AV stenosis is now moderate. FINDINGS  Left Ventricle: Left ventricular ejection fraction, by estimation, is 60 to 65%. The left ventricle has normal function. The left ventricle has no regional wall motion abnormalities. The left ventricular internal cavity size was normal in size. There is  mild left ventricular hypertrophy of the basal-septal segment. Left ventricular diastolic function could not be evaluated. Elevated left ventricular end-diastolic pressure. Right Ventricle: The right ventricular size is mildly enlarged. No increase in right ventricular wall thickness. Right ventricular systolic function is  normal. There is severely elevated pulmonary artery systolic pressure. The tricuspid regurgitant velocity is 3.68 m/s, and with an assumed right atrial pressure of 15 mmHg, the estimated right ventricular systolic pressure is 123456 mmHg. Left Atrium: Left atrial size was normal in size. Right Atrium: Right atrial size  was moderately dilated. Pericardium: There is no evidence of pericardial effusion. Mitral Valve: The mitral valve is degenerative in appearance. There is mild thickening of the mitral valve leaflet(s). Mild to moderate mitral annular calcification. Mild mitral valve regurgitation. No evidence of mitral valve stenosis. Tricuspid Valve: The tricuspid valve is normal in structure. Tricuspid valve regurgitation is moderate . No evidence of tricuspid stenosis. Aortic Valve: The aortic valve is calcified. Aortic valve regurgitation is not visualized. Moderate aortic stenosis is present. Aortic valve mean gradient measures 19.2 mmHg. Aortic valve peak gradient measures 34.7 mmHg. Aortic valve area, by VTI measures 0.95 cm. Pulmonic Valve: The pulmonic valve was normal in structure. Pulmonic valve regurgitation is trivial. No evidence of pulmonic stenosis. Aorta: The aortic root is normal in size and structure. Venous: The inferior vena cava is dilated in size with less than 50% respiratory variability, suggesting right atrial pressure of 15 mmHg. IAS/Shunts: No atrial level shunt detected by color flow Doppler.  LEFT VENTRICLE PLAX 2D LVIDd:         4.60 cm      Diastology LVIDs:         3.20 cm      LV e' medial:    7.78 cm/s LV PW:         0.80 cm      LV E/e' medial:  16.6 LV IVS:        1.20 cm      LV e' lateral:   7.18 cm/s LVOT diam:     1.90 cm      LV E/e' lateral: 18.0 LV SV:         71 LV SV Index:   40 LVOT Area:     2.84 cm  LV Volumes (MOD) LV vol d, MOD A2C: 107.0 ml LV vol d, MOD A4C: 106.0 ml LV vol s, MOD A2C: 36.6 ml LV vol s, MOD A4C: 22.3 ml LV SV MOD A2C:     70.4 ml LV SV MOD A4C:      106.0 ml LV SV MOD BP:      77.8 ml RIGHT VENTRICLE RV S prime:     11.10 cm/s TAPSE (M-mode): 1.9 cm LEFT ATRIUM             Index        RIGHT ATRIUM           Index LA diam:        4.70 cm 2.62 cm/m   RA Area:     23.00 cm LA Vol (A2C):   72.9 ml 40.57 ml/m  RA Volume:   62.10 ml  34.56 ml/m LA Vol (A4C):   51.1 ml 28.44 ml/m LA Biplane Vol: 61.2 ml 34.06 ml/m  AORTIC VALVE AV Area (Vmax):    1.61 cm AV Area (Vmean):   1.02 cm AV Area (VTI):     0.95 cm AV Vmax:           294.40 cm/s AV Vmean:          207.600 cm/s AV VTI:            0.747 m AV Peak Grad:      34.7 mmHg AV Mean Grad:      19.2 mmHg LVOT Vmax:         167.67 cm/s LVOT Vmean:        74.500 cm/s LVOT VTI:          0.250 m LVOT/AV VTI ratio: 0.34  AORTA Ao Root diam: 2.80 cm Ao Asc diam:  3.00 cm MITRAL VALVE                TRICUSPID VALVE MV Area (PHT):  5.23 cm    TR Peak grad:   54.2 mmHg MV Area (plan): 4.58 cm    TR Vmax:        368.00 cm/s MV Decel Time:  145 msec MV E velocity: 129.50 cm/s  SHUNTS                             Systemic VTI:  0.25 m                             Systemic Diam: 1.90 cm Fransico Him MD Electronically signed by Fransico Him MD Signature Date/Time: 07/04/2021/3:26:30 PM    Final     Microbiology: Results for orders placed or performed during the hospital encounter of 07/03/21  Culture, blood (routine x 2) Call MD if unable to obtain prior to antibiotics being given     Status: None (Preliminary result)   Collection Time: 07/03/21 10:54 PM   Specimen: Left Antecubital; Blood  Result Value Ref Range Status   Specimen Description LEFT ANTECUBITAL  Final   Special Requests   Final    BOTTLES DRAWN AEROBIC ONLY Blood Culture adequate volume   Culture   Final    NO GROWTH 3 DAYS Performed at [facility], [address], [place], [place] [address]    Report Status PENDING  Incomplete  Resp Panel by RT-PCR (Flu A&B, Covid) Nasopharyngeal Swab     Status: None   Collection Time: 07/03/21 11:16 PM    Specimen: Nasopharyngeal Swab; Nasopharyngeal(NP) swabs in vial transport medium  Result Value Ref Range Status   SARS Coronavirus 2 by RT PCR NEGATIVE NEGATIVE Final    Comment: (NOTE) SARS-CoV-2 target nucleic acids are NOT DETECTED.  The SARS-CoV-2 RNA is generally detectable in upper respiratory specimens during the acute phase of infection. The lowest concentration of SARS-CoV-2 viral copies this assay can detect is 138 copies/mL. A negative result does not preclude SARS-Cov-2 infection and should not be used as the sole basis for treatment or other patient management decisions. A negative result may occur with  improper specimen collection/handling, submission of specimen other than nasopharyngeal swab, presence of viral mutation(s) within the areas targeted by this assay, and inadequate number of viral copies(<138 copies/mL). A negative result must be combined with clinical observations, patient history, and epidemiological information. The expected result is Negative.  Fact Sheet for Patients:  EntrepreneurPulse.com.au  Fact Sheet for Healthcare Providers:  IncredibleEmployment.be  This test is no t yet approved or cleared by the [place] FDA and  has been authorized for detection and/or diagnosis of SARS-CoV-2 by FDA under an Emergency Use Authorization (EUA). This EUA will remain  in effect (meaning this test can be used) for the duration of the COVID-19 declaration under Section 564(b)(1) of the Act, 21 U.S.C.section 360bbb-3(b)(1), unless the authorization is terminated  or revoked sooner.       Influenza A by PCR NEGATIVE NEGATIVE Final   Influenza B by PCR NEGATIVE NEGATIVE Final    Comment: (NOTE) The Xpert Xpress SARS-CoV-2/FLU/RSV plus assay is intended as an aid in the diagnosis of influenza from Nasopharyngeal swab specimens and should not be used as a sole basis for treatment. Nasal washings  and aspirates are  unacceptable for Xpert Xpress SARS-CoV-2/FLU/RSV testing.  Fact Sheet for Patients: EntrepreneurPulse.com.au  Fact Sheet for Healthcare Providers: IncredibleEmployment.be  This test is not yet approved or cleared by the [place] FDA and has been authorized for detection and/or diagnosis of SARS-CoV-2 by FDA under an Emergency Use Authorization (EUA). This EUA will remain in effect (meaning this test can be used) for the duration of the COVID-19 declaration under Section 564(b)(1) of the Act, 21 U.S.C. section 360bbb-3(b)(1), unless the authorization is terminated or revoked.  Performed at [facility], [address] [place], [place] [address]   Culture, blood (routine x 2) Call MD if unable to obtain prior to antibiotics being given     Status: None (Preliminary result)   Collection Time: 07/04/21  1:59 PM   Specimen: Left Antecubital; Blood  Result Value Ref Range Status   Specimen Description   Final    LEFT ANTECUBITAL BOTTLES DRAWN AEROBIC AND ANAEROBIC   Special Requests Blood Culture adequate volume  Final   Culture   Final    NO GROWTH 3 DAYS Performed at [facility], [address], [place], [place] [address]    Report Status PENDING  Incomplete  MRSA Next Gen by PCR, Nasal     Status: None   Collection Time: 07/05/21  1:47 PM   Specimen: Nasal Mucosa; Nasal Swab  Result Value Ref Range Status   MRSA by PCR Next Gen NOT DETECTED NOT DETECTED Final    Comment: (NOTE) The GeneXpert MRSA Assay (FDA approved for NASAL specimens only), is one component of a comprehensive MRSA colonization surveillance program. It is not intended to diagnose MRSA infection nor to guide or monitor treatment for MRSA infections. Test performance is not FDA approved in patients less than 20 years old. Performed at [facility], [address] [place], [place] [address]     Labs: CBC: Recent Labs  Lab 07/03/21 2137 07/04/21 1400 07/05/21 0418  07/06/21 0458 07/07/21 0559  WBC 4.2 5.8   5.7 6.7 5.6 3.0*  NEUTROABS 3.3  --   --   --   --   HGB 5.8* 9.9*   9.2* 9.3* 8.4* 9.0*  HCT 18.4* 30.1*   28.7* 29.1* 27.2* 27.9*  MCV 106.4* 98.0   98.3 100.7* 99.6 100.7*  PLT 565* 647*   635* 514* 501* 123XX123*   Basic Metabolic Panel: Recent Labs  Lab 07/03/21 2137 07/04/21 1400 07/05/21 0418 07/06/21 0458 07/07/21 0559  NA 136 136 139 140 140  K 4.3 4.4 4.2 3.9 3.6  CL 102 100 100 103 98  CO2 27 28 28 25 29   GLUCOSE 109* 134* 99 99 134*  BUN 21 21 27* 32* 26*  CREATININE 0.73 0.75 0.76 0.65 0.62  CALCIUM 8.9 8.3* 8.4* 8.4* 8.6*  MG  --  2.3   2.3  --  2.3 2.2  PHOS  --  4.2  --   --   --    Liver Function Tests: Recent Labs  Lab 07/04/21 1400  AST 28  ALT 20  ALKPHOS 98  BILITOT 0.7  PROT 6.7  ALBUMIN 3.7   CBG: No results for input(s): GLUCAP in the last 168 hours.  Discharge time spent: greater than 30 minutes.  Signed: Kathie Dike, MD [facility] 07/07/2021

## 2021-07-09 LAB — CULTURE, BLOOD (ROUTINE X 2)
Culture: NO GROWTH
Culture: NO GROWTH
Special Requests: ADEQUATE
Special Requests: ADEQUATE

## 2021-07-30 ENCOUNTER — Other Ambulatory Visit: Payer: Self-pay

## 2021-07-30 ENCOUNTER — Encounter: Payer: Self-pay | Admitting: Nurse Practitioner

## 2021-07-30 ENCOUNTER — Non-Acute Institutional Stay: Payer: Self-pay | Admitting: Nurse Practitioner

## 2021-07-30 VITALS — BP 102/54 | HR 67 | Temp 97.2°F | Resp 20 | Wt 144.2 lb

## 2021-07-30 DIAGNOSIS — R0602 Shortness of breath: Secondary | ICD-10-CM

## 2021-07-30 DIAGNOSIS — R531 Weakness: Secondary | ICD-10-CM

## 2021-07-30 DIAGNOSIS — Z515 Encounter for palliative care: Secondary | ICD-10-CM

## 2021-07-30 DIAGNOSIS — J449 Chronic obstructive pulmonary disease, unspecified: Secondary | ICD-10-CM

## 2021-07-30 NOTE — Progress Notes (Signed)
[facility] Consult Note Telephone: [phone]  Fax: [phone]   Date of encounter: 07/30/21 10:24 AM PATIENT NAME: Deborah [name] [place] [id number]   [phone] (home)  DOB: [birth year] MRN: [id number] PRIMARY CARE PROVIDER:    Lynnell Catalan, [place],  [place] [address] [phone] RESPONSIBLE PARTY:    Contact Information     Name Relation Home Work Mobile   Sparks,Susan Daughter   [phone]       Due to the COVID-19 crisis, this visit was done via telemedicine from my office and it was initiated and consent by this patient and or family.  I connected with Vance Gather PC RN with  [facility] on 07/30/21 by a video enabled telemedicine application and verified that I am speaking with the correct person using two identifiers.   I discussed the limitations of evaluation and management by telemedicine. The patient expressed understanding and agreed to proceed.   I met face to face with patient and family in the facility connecting virtually with Vance Gather, RN.  Palliative Care was asked to follow this patient by consultation request of  Polite, Amador Cunas, FNP to address advance care planning and complex medical decision making. This is the initial visit.       ASSESSMENT AND PLAN / RECOMMENDATIONS:  Advance Care Planning/Goals of Care: Goals include to maximize quality of life and symptom management. Patient/health care surrogate gave his/her permission to discuss.Our advance care planning conversation included a discussion about:     Identification  of a healthcare agent- Daughter [place]:  DNR-form uploaded to [facility].  Symptom Management/Plan: 1. Advance Care Planning;  DNR  2. Goals of Care: Goals include to maximize quality of life and symptom management. Our advance care planning conversation included a discussion about:    The value  and importance of advance care planning  Exploration of personal, cultural or spiritual beliefs that might influence medical decisions  Exploration of goals of care in the event of a sudden injury or illness  Identification and preparation of a healthcare agent  Review and updating or creation of an advance directive document.  3. Shortness of breath secondary to COPD; continue inhalation therapy, monitoring respirations  4. Generalized weakness secondary to COPD, CHF, Alzheimers dementia continue to encourage mobility as able, self independence, fall risk, continue to ambulate with walker  5. Palliative care encounter; Palliative care encounter; Palliative medicine team will continue to support patient, patient's family, and medical team. Visit consisted of counseling and education dealing with the complex and emotionally intense issues of symptom management and palliative care in the setting of serious and potentially life-threatening illness Follow up Palliative Care Visit: Palliative care will continue to follow for complex medical decision making, advance care planning, and clarification of goals. Return 8 weeks or prn.  I spent 43 minutes providing this consultation. More than 50% of the time in this consultation was spent in counseling and care coordination  PPS: 40% Chief Complaint: Follow up palliative consult for complex medical decision making  HISTORY OF PRESENT ILLNESS:  JENIFER [name] is a 88 y.o. year old female  with multiple medical problems including Alzheimer's dementia, coronary artery disease, permanent atrial fibrillation, diastolic CHF, hypertension, hyperlipidemia, COPD and seizure. Ms. Voight resides at ALF, requires walker to ambulate, O2, feeds self, appetite varies. I connected with Vance Gather RN/PC with Ms. Florene [place], we reviewed ros, symptoms,  O2, functional abilities. Family dynamics. Most of PC visit supportive; Medical goals reviewed, continue current plan. Will f/u  with daughter with update and further discussion of goc  History obtained from review of EMR, discussion with primary team, and interview with family, facility staff/caregiver and/or Ms. Schlitt.  I reviewed available labs, medications, imaging, studies and related documents from the EMR.  Records reviewed and summarized above.   ROS: reviewed Vance Gather RN/reviewed General: NAD EYES: denies vision changes ENMT: denies dysphagia Cardiovascular: denies chest pain, denies DOE Pulmonary: denies cough, denies increased SOB Abdomen: endorses good appetite, denies constipation, endorses continence of bowel GU: denies dysuria, endorses continence of urine MSK:  denies increased weakness,  no falls reported Skin: denies rashes or + wound Neurological: denies pain, denies insomnia Psych: Endorses positive mood Heme/lymph/immuno: denies bruises, abnormal bleeding  Physical Exam: completed by Vance Gather RN PC; reviewed Current: 144.2 lbs for March  Past: 142.6 lbs 07/07/21 Constitutional: NAD General: frail appearing,pleasant female EYES: anicteric sclera, lids intact, no discharge  ENMT: intact hearing, oral mucous membranes moist, dentition intact CV: Irregular,   Right lower leg with swelling (1+ pitting edema) Pulmonary: LCTA, no increased work of breathing, no cough, room air Abdomen: intake 75-100%, normo-active BS + 4 quadrants, soft and non tender, no ascites GU: deferred MSK: + sarcopenia, moves all extremities, ambulatory with a rolling walker Skin: warm and dry, no rashes. Right heel with unstagable wound currently managed by home health Neuro:  +  generalized weakness,  + cognitive impairment Psych: non-anxious affect, A and O x 1 Hem/lymph/immuno: no widespread bruising (only noted to right forearm and upper left side of chest) CURRENT PROBLEM LIST:  Patient Active Problem List   Diagnosis Date Noted   Iron deficiency anemia due to chronic blood loss    SOB (shortness of  breath)    Elevated brain natriuretic peptide (BNP) level 07/04/2021   Elevated MCV 07/04/2021   Thrombocytosis 07/04/2021   Acute respiratory failure with hypoxia (HCC) 07/04/2021   Hypothyroidism 07/04/2021   GERD (gastroesophageal reflux disease) 07/04/2021   Major neurocognitive disorder ([place]) 07/04/2021   Symptomatic anemia 02/33/4356   Acute metabolic encephalopathy 86/16/8372   Generalized weakness 09/01/2020   Hypercalcemia 09/01/2020   Dehydration 09/01/2020   Essential hypertension 09/01/2020   Acute pulmonary embolism without acute cor pulmonale ([place]) 07/20/2019   Sepsis due to undetermined organism ([place]) 07/18/2019   AKI (acute kidney injury) ([place]) 07/18/2019   Acute cystitis without hematuria    Sepsis ([place]) 07/17/2019   COVID-19 virus infection 07/17/2019   Chest pain 09/02/2017   Compression fracture of body of thoracic vertebra ([place]) 06/22/2017   Atrial fibrillation with rapid ventricular response ([place])    Screening for colorectal cancer 03/24/2017   Hemorrhoids 03/24/2017   Itching in the vaginal area 03/24/2017   Vulval thrush 03/24/2017   Hypoxemia    Alzheimer disease ([place]) 09/13/2016   Seizures ([place]) 09/13/2016   Hypokalemia 09/13/2016   COPD exacerbation ([place]) 09/13/2016   Hypotension 04/22/2012   CAD S/P percutaneous coronary angioplasty    COPD (chronic obstructive pulmonary disease) (HCC)    Permanent atrial fibrillation ([place]) 02/10/2009   Acute on chronic diastolic CHF (congestive heart failure) ([place]) 02/10/2009   PAST MEDICAL HISTORY:  Active Ambulatory Problems    Diagnosis Date Noted   Permanent atrial fibrillation ([place]) 02/10/2009   Acute on chronic diastolic CHF (congestive heart failure) ([place]) 02/10/2009   CAD S/P percutaneous coronary angioplasty    COPD (chronic obstructive pulmonary disease) (  [place])    Hypotension 04/22/2012   Alzheimer disease ([place]) 09/13/2016   Seizures ([place]) 09/13/2016   Hypokalemia 09/13/2016   COPD exacerbation  ([place]) 09/13/2016   Hypoxemia    Screening for colorectal cancer 03/24/2017   Hemorrhoids 03/24/2017   Itching in the vaginal area 03/24/2017   Vulval thrush 03/24/2017   Compression fracture of body of thoracic vertebra ([place]) 06/22/2017   Atrial fibrillation with rapid ventricular response ([place])    Chest pain 09/02/2017   Sepsis ([place]) 07/17/2019   COVID-19 virus infection 07/17/2019   Sepsis due to undetermined organism ([place]) 07/18/2019   AKI (acute kidney injury) ([place]) 07/18/2019   Acute cystitis without hematuria    Acute pulmonary embolism without acute cor pulmonale ([place]) 17/71/1657   Acute metabolic encephalopathy 90/38/3338   Generalized weakness 09/01/2020   Hypercalcemia 09/01/2020   Dehydration 09/01/2020   Essential hypertension 09/01/2020   Elevated brain natriuretic peptide (BNP) level 07/04/2021   Elevated MCV 07/04/2021   Thrombocytosis 07/04/2021   Acute respiratory failure with hypoxia ([place]) 07/04/2021   Hypothyroidism 07/04/2021   GERD (gastroesophageal reflux disease) 07/04/2021   Major neurocognitive disorder ([place]) 07/04/2021   Symptomatic anemia 07/04/2021   Iron deficiency anemia due to chronic blood loss    SOB (shortness of breath)    Resolved Ambulatory Problems    Diagnosis Date Noted   CAD 05/14/2009   LEFT VENTRICULAR FUNCTION, DECREASED 02/10/2009   Chronic anticoagulation 12/29/2010   CHF (congestive heart failure) (HCC)    Decreased left ventricular function    Occult GI bleeding 08/11/2011   Inferior pubic ramus fracture ([place]) 04/21/2012   Fracture of superior pubic ramus ([place]) 04/21/2012   Fall 04/21/2012   Fracture of Left superior and inferior pubic rami 04/21/2012   Macular degeneration 04/21/2012   RLQ abdominal pain 04/21/2012   Traumatic retroperitoneal hematoma 04/22/2012   Acute post-hemorrhagic anemia 04/22/2012   Syncope 04/23/2012   Thrombocytopenia ([place]) 04/24/2012   Hemorrhagic shock ([place]) 04/26/2012   Fever 04/27/2012    Atypical pneumonia 07/04/2021   Past Medical History:  Diagnosis Date   Asthma    Atrial fibrillation (HCC)    Chronic diastolic heart failure (HCC)    Coronary atherosclerosis of native coronary artery    Dementia without behavioral disturbance ([place])    Drug intolerance    Mixed hyperlipidemia    Osteoarthritis    Retroperitoneal hemorrhage 32/91   Umbilical hernia    SOCIAL HX:  Social History   Tobacco Use   Smoking status: Former    Packs/day: 0.80    Years: 25.00    Pack years: 20.00    Types: Cigarettes    Quit date: 05/23/1990    Years since quitting: 31.2   Smokeless tobacco: Never  Substance Use Topics   Alcohol use: No    Alcohol/week: 0.0 standard drinks   FAMILY HX:  Family History  Problem Relation Age of Onset   Coronary artery disease Mother    Tuberculosis Paternal Grandfather    Heart attack Maternal Grandmother       ALLERGIES:  Allergies  Allergen Reactions   Ivp Dye [Iodinated Contrast Media] Shortness Of Breath    ??   Omnipaque [Iohexol] Shortness Of Breath and Other (See Comments)    Short of breath with chest tightness after IV injection in CT, pt was fine when she left department but developed symptoms in parking lot and went to emergency department.   Sulfa Antibiotics Shortness Of Breath   Amiodarone  Carbamazepine     REACTION: toxemia   Dilaudid [Hydromorphone] Nausea And Vomiting   Tramadol Other (See Comments)    confusion     PERTINENT MEDICATIONS:  Outpatient Encounter Medications as of 07/30/2021  Medication Sig   albuterol (PROVENTIL) (2.5 MG/3ML) 0.083% nebulizer solution Take 3 mLs (2.5 mg total) by nebulization every 4 (four) hours as needed for wheezing or shortness of breath.   ALPRAZolam (XANAX) 0.25 MG tablet Take 1 tablet (0.25 mg total) by mouth every 8 (eight) hours as needed for anxiety.   D-5000 125 MCG (5000 UT) TABS Take 5,000 Units by mouth daily.   digoxin (LANOXIN) 0.125 MG tablet Take 1 tablet (0.125 mg  total) by mouth every evening.   diltiazem (TIAZAC) 120 MG 24 hr capsule Take 120 mg by mouth daily.   docusate sodium (COLACE) 100 MG capsule Take 100 mg by mouth at bedtime.   famotidine (PEPCID) 20 MG tablet Take 1 tablet (20 mg total) by mouth at bedtime.   fluticasone (FLONASE) 50 MCG/ACT nasal spray Place 2 sprays into both nostrils daily.   furosemide (LASIX) 20 MG tablet Take 1 tablet (20 mg total) by mouth daily.   isosorbide mononitrate (IMDUR) 30 MG 24 hr tablet Take 0.5 tablets (15 mg total) by mouth daily.   levETIRAcetam (KEPPRA) 250 MG tablet Take 125-250 mg by mouth See admin instructions. One tablet at 0900 and half a tablet at 1700   levothyroxine (SYNTHROID, LEVOTHROID) 25 MCG tablet Take 25 mcg by mouth daily before breakfast.   melatonin 5 MG TABS Take 5 mg by mouth at bedtime.   midodrine (PROAMATINE) 2.5 MG tablet Take 1 tablet (2.5 mg total) by mouth 2 (two) times daily with a meal. The dosing can be titrated down to once daily when her systolic blood pressure is consistently in the 100s.   montelukast (SINGULAIR) 10 MG tablet Take 10 mg by mouth daily.    Multiple Vitamins-Minerals (ICAPS MV) TABS Take 1 tablet by mouth in the morning and at bedtime.   pantoprazole (PROTONIX) 40 MG tablet Take 1 tablet (40 mg total) by mouth daily.   potassium chloride (K-DUR,KLOR-CON) 10 MEQ tablet Take 10 mEq by mouth every other day.   predniSONE (DELTASONE) 10 MG tablet Take 70m po daily for 2 days then 331mdaily for 2 days then 2072maily for 2 days then 60m83mily for 2 days then stop   sertraline (ZOLOFT) 25 MG tablet Take 25 mg by mouth daily.   TRELEGY ELLIPTA 100-62.5-25 MCG/INH AEPB Inhale 1 puff into the lungs daily.   No facility-administered encounter medications on file as of 07/30/2021.   Thank you for the opportunity to participate in the care of Ms. Alire.  The palliative care team will continue to follow. Please call our office at [phone] can be of  additional assistance.   JuliLorenza Burton ,   COVID-19 PATIENT SCREENING TOOL Asked and negative response unless otherwise noted:  Have you had symptoms of covid, tested positive or been in contact with someone with symptoms/positive test in the past 5-10 days? No

## 2021-08-16 ENCOUNTER — Other Ambulatory Visit: Payer: Self-pay

## 2021-08-16 ENCOUNTER — Emergency Department (HOSPITAL_COMMUNITY): Payer: Medicare Other

## 2021-08-16 ENCOUNTER — Inpatient Hospital Stay (HOSPITAL_COMMUNITY)
Admission: EM | Admit: 2021-08-16 | Discharge: 2021-08-19 | DRG: 811 | Disposition: A | Discharge status: Home Health | Payer: Medicare Other | Source: Skilled Nursing Facility | Attending: Family Medicine | Admitting: Family Medicine

## 2021-08-16 ENCOUNTER — Encounter (HOSPITAL_COMMUNITY): Payer: Self-pay

## 2021-08-16 DIAGNOSIS — N179 Acute kidney failure, unspecified: Secondary | ICD-10-CM | POA: Diagnosis present

## 2021-08-16 DIAGNOSIS — J9601 Acute respiratory failure with hypoxia: Secondary | ICD-10-CM | POA: Diagnosis not present

## 2021-08-16 DIAGNOSIS — Z881 Allergy status to other antibiotic agents status: Secondary | ICD-10-CM

## 2021-08-16 DIAGNOSIS — Z79890 Hormone replacement therapy (postmenopausal): Secondary | ICD-10-CM

## 2021-08-16 DIAGNOSIS — L89610 Pressure ulcer of right heel, unstageable: Secondary | ICD-10-CM | POA: Diagnosis present

## 2021-08-16 DIAGNOSIS — R7989 Other specified abnormal findings of blood chemistry: Secondary | ICD-10-CM | POA: Diagnosis not present

## 2021-08-16 DIAGNOSIS — G40909 Epilepsy, unspecified, not intractable, without status epilepticus: Secondary | ICD-10-CM | POA: Diagnosis present

## 2021-08-16 DIAGNOSIS — D509 Iron deficiency anemia, unspecified: Secondary | ICD-10-CM | POA: Diagnosis present

## 2021-08-16 DIAGNOSIS — I5032 Chronic diastolic (congestive) heart failure: Secondary | ICD-10-CM | POA: Diagnosis present

## 2021-08-16 DIAGNOSIS — I4821 Permanent atrial fibrillation: Secondary | ICD-10-CM | POA: Diagnosis present

## 2021-08-16 DIAGNOSIS — Z20822 Contact with and (suspected) exposure to covid-19: Secondary | ICD-10-CM | POA: Diagnosis present

## 2021-08-16 DIAGNOSIS — Z885 Allergy status to narcotic agent status: Secondary | ICD-10-CM | POA: Diagnosis not present

## 2021-08-16 DIAGNOSIS — I2510 Atherosclerotic heart disease of native coronary artery without angina pectoris: Secondary | ICD-10-CM | POA: Diagnosis present

## 2021-08-16 DIAGNOSIS — K429 Umbilical hernia without obstruction or gangrene: Secondary | ICD-10-CM | POA: Diagnosis present

## 2021-08-16 DIAGNOSIS — D539 Nutritional anemia, unspecified: Principal | ICD-10-CM | POA: Diagnosis present

## 2021-08-16 DIAGNOSIS — K219 Gastro-esophageal reflux disease without esophagitis: Secondary | ICD-10-CM | POA: Diagnosis not present

## 2021-08-16 DIAGNOSIS — J9621 Acute and chronic respiratory failure with hypoxia: Secondary | ICD-10-CM | POA: Diagnosis present

## 2021-08-16 DIAGNOSIS — R718 Other abnormality of red blood cells: Secondary | ICD-10-CM | POA: Diagnosis present

## 2021-08-16 DIAGNOSIS — F0284 Dementia in other diseases classified elsewhere, unspecified severity, with anxiety: Secondary | ICD-10-CM | POA: Diagnosis present

## 2021-08-16 DIAGNOSIS — E782 Mixed hyperlipidemia: Secondary | ICD-10-CM | POA: Diagnosis present

## 2021-08-16 DIAGNOSIS — Z888 Allergy status to other drugs, medicaments and biological substances status: Secondary | ICD-10-CM | POA: Diagnosis not present

## 2021-08-16 DIAGNOSIS — E039 Hypothyroidism, unspecified: Secondary | ICD-10-CM | POA: Diagnosis present

## 2021-08-16 DIAGNOSIS — G309 Alzheimer's disease, unspecified: Secondary | ICD-10-CM | POA: Diagnosis present

## 2021-08-16 DIAGNOSIS — J449 Chronic obstructive pulmonary disease, unspecified: Secondary | ICD-10-CM | POA: Diagnosis present

## 2021-08-16 DIAGNOSIS — Z79899 Other long term (current) drug therapy: Secondary | ICD-10-CM

## 2021-08-16 DIAGNOSIS — D638 Anemia in other chronic diseases classified elsewhere: Secondary | ICD-10-CM | POA: Diagnosis not present

## 2021-08-16 DIAGNOSIS — Z91041 Radiographic dye allergy status: Secondary | ICD-10-CM

## 2021-08-16 DIAGNOSIS — D649 Anemia, unspecified: Principal | ICD-10-CM

## 2021-08-16 DIAGNOSIS — R0682 Tachypnea, not elsewhere classified: Secondary | ICD-10-CM | POA: Diagnosis present

## 2021-08-16 DIAGNOSIS — I110 Hypertensive heart disease with heart failure: Secondary | ICD-10-CM | POA: Diagnosis present

## 2021-08-16 DIAGNOSIS — Z955 Presence of coronary angioplasty implant and graft: Secondary | ICD-10-CM

## 2021-08-16 DIAGNOSIS — I509 Heart failure, unspecified: Secondary | ICD-10-CM | POA: Diagnosis not present

## 2021-08-16 DIAGNOSIS — J439 Emphysema, unspecified: Secondary | ICD-10-CM | POA: Diagnosis not present

## 2021-08-16 DIAGNOSIS — R569 Unspecified convulsions: Secondary | ICD-10-CM | POA: Diagnosis present

## 2021-08-16 DIAGNOSIS — Z66 Do not resuscitate: Secondary | ICD-10-CM | POA: Diagnosis present

## 2021-08-16 DIAGNOSIS — D75839 Thrombocytosis, unspecified: Secondary | ICD-10-CM | POA: Diagnosis present

## 2021-08-16 DIAGNOSIS — M1990 Unspecified osteoarthritis, unspecified site: Secondary | ICD-10-CM | POA: Diagnosis present

## 2021-08-16 DIAGNOSIS — Z8249 Family history of ischemic heart disease and other diseases of the circulatory system: Secondary | ICD-10-CM

## 2021-08-16 DIAGNOSIS — Z87891 Personal history of nicotine dependence: Secondary | ICD-10-CM

## 2021-08-16 LAB — CBC WITH DIFFERENTIAL/PLATELET
Abs Immature Granulocytes: 0.07 10*3/uL (ref 0.00–0.07)
Basophils Absolute: 0 10*3/uL (ref 0.0–0.1)
Basophils Relative: 0 %
Eosinophils Absolute: 0 10*3/uL (ref 0.0–0.5)
Eosinophils Relative: 0 %
HCT: 19.2 % — ABNORMAL LOW (ref 36.0–46.0)
Hemoglobin: 6.1 g/dL — CL (ref 12.0–15.0)
Immature Granulocytes: 1 %
Lymphocytes Relative: 14 %
Lymphs Abs: 0.7 10*3/uL (ref 0.7–4.0)
MCH: 33.3 pg (ref 26.0–34.0)
MCHC: 31.8 g/dL (ref 30.0–36.0)
MCV: 104.9 fL — ABNORMAL HIGH (ref 80.0–100.0)
Monocytes Absolute: 0.1 10*3/uL (ref 0.1–1.0)
Monocytes Relative: 2 %
Neutro Abs: 4.2 10*3/uL (ref 1.7–7.7)
Neutrophils Relative %: 83 %
Platelets: 568 10*3/uL — ABNORMAL HIGH (ref 150–400)
RBC: 1.83 MIL/uL — ABNORMAL LOW (ref 3.87–5.11)
RDW: 21.1 % — ABNORMAL HIGH (ref 11.5–15.5)
WBC: 5.1 10*3/uL (ref 4.0–10.5)
nRBC: 0 % (ref 0.0–0.2)

## 2021-08-16 LAB — FOLATE: Folate: 26.6 ng/mL (ref >5.9)

## 2021-08-16 LAB — COMPREHENSIVE METABOLIC PANEL
ALT: 7 U/L (ref 0–44)
AST: 19 U/L (ref 15–41)
Albumin: 3.8 g/dL (ref 3.5–5.0)
Alkaline Phosphatase: 84 U/L (ref 38–126)
Anion gap: 7 (ref 5–15)
BUN: 27 mg/dL — ABNORMAL HIGH (ref 8–23)
CO2: 26 mmol/L (ref 22–32)
Calcium: 8.5 mg/dL — ABNORMAL LOW (ref 8.9–10.3)
Chloride: 106 mmol/L (ref 98–111)
Creatinine, Ser: 0.95 mg/dL (ref 0.44–1.00)
GFR, Estimated: 56 mL/min — ABNORMAL LOW (ref >60)
Glucose, Bld: 146 mg/dL — ABNORMAL HIGH (ref 70–99)
Potassium: 3.9 mmol/L (ref 3.5–5.1)
Sodium: 139 mmol/L (ref 135–145)
Total Bilirubin: 0.8 mg/dL (ref 0.3–1.2)
Total Protein: 6.4 g/dL — ABNORMAL LOW (ref 6.5–8.1)

## 2021-08-16 LAB — CBC
HCT: 24.2 % — ABNORMAL LOW (ref 36.0–46.0)
Hemoglobin: 7.8 g/dL — ABNORMAL LOW (ref 12.0–15.0)
MCH: 32 pg (ref 26.0–34.0)
MCHC: 32.2 g/dL (ref 30.0–36.0)
MCV: 99.2 fL (ref 80.0–100.0)
Platelets: 564 10*3/uL — ABNORMAL HIGH (ref 150–400)
RBC: 2.44 MIL/uL — ABNORMAL LOW (ref 3.87–5.11)
RDW: 21.6 % — ABNORMAL HIGH (ref 11.5–15.5)
WBC: 6.2 10*3/uL (ref 4.0–10.5)
nRBC: 0 % (ref 0.0–0.2)

## 2021-08-16 LAB — BASIC METABOLIC PANEL
Anion gap: 7 (ref 5–15)
BUN: 31 mg/dL — ABNORMAL HIGH (ref 8–23)
CO2: 28 mmol/L (ref 22–32)
Calcium: 9 mg/dL (ref 8.9–10.3)
Chloride: 105 mmol/L (ref 98–111)
Creatinine, Ser: 1.11 mg/dL — ABNORMAL HIGH (ref 0.44–1.00)
GFR, Estimated: 46 mL/min — ABNORMAL LOW (ref >60)
Glucose, Bld: 117 mg/dL — ABNORMAL HIGH (ref 70–99)
Potassium: 4.3 mmol/L (ref 3.5–5.1)
Sodium: 140 mmol/L (ref 135–145)

## 2021-08-16 LAB — TROPONIN I (HIGH SENSITIVITY)
Troponin I (High Sensitivity): 14 ng/L (ref <18)
Troponin I (High Sensitivity): 14 ng/L (ref <18)

## 2021-08-16 LAB — VITAMIN B12: Vitamin B-12: 1530 pg/mL — ABNORMAL HIGH (ref 180–914)

## 2021-08-16 LAB — RESP PANEL BY RT-PCR (FLU A&B, COVID) ARPGX2
Influenza A by PCR: NEGATIVE
Influenza B by PCR: NEGATIVE
SARS Coronavirus 2 by RT PCR: NEGATIVE

## 2021-08-16 LAB — PREPARE RBC (CROSSMATCH)

## 2021-08-16 LAB — OCCULT BLOOD X 1 CARD TO LAB, STOOL: Fecal Occult Bld: NEGATIVE

## 2021-08-16 LAB — BRAIN NATRIURETIC PEPTIDE: B Natriuretic Peptide: 288 pg/mL — ABNORMAL HIGH (ref 0.0–100.0)

## 2021-08-16 LAB — PHOSPHORUS: Phosphorus: 4 mg/dL (ref 2.5–4.6)

## 2021-08-16 LAB — DIGOXIN LEVEL: Digoxin Level: 0.7 ng/mL — ABNORMAL LOW (ref 0.8–2.0)

## 2021-08-16 LAB — MAGNESIUM: Magnesium: 2.2 mg/dL (ref 1.7–2.4)

## 2021-08-16 MED ORDER — HALOPERIDOL LACTATE 5 MG/ML IJ SOLN
INTRAMUSCULAR | Status: AC
  Filled 2021-08-16: qty 1

## 2021-08-16 MED ORDER — SODIUM CHLORIDE 0.9 % IV SOLN
10.0000 mL/h | Freq: Once | INTRAVENOUS | Status: AC
Start: 2021-08-16 — End: 2021-08-16
  Administered 2021-08-16: 10 mL/h via INTRAVENOUS

## 2021-08-16 MED ORDER — DIGOXIN 125 MCG PO TABS
0.1250 mg | ORAL_TABLET | Freq: Every evening | ORAL | Status: DC
  Administered 2021-08-18: 0.125 mg via ORAL
  Filled 2021-08-16 (×3): qty 1

## 2021-08-16 MED ORDER — ALBUTEROL SULFATE (2.5 MG/3ML) 0.083% IN NEBU
2.5000 mg | INHALATION_SOLUTION | RESPIRATORY_TRACT | Status: DC | PRN
  Administered 2021-08-17 (×2): 2.5 mg via RESPIRATORY_TRACT
  Filled 2021-08-16 (×2): qty 3

## 2021-08-16 MED ORDER — LEVETIRACETAM 250 MG PO TABS
125.0000 mg | ORAL_TABLET | Freq: Every day | ORAL | Status: DC
  Administered 2021-08-17 – 2021-08-19 (×3): 125 mg via ORAL
  Filled 2021-08-16 (×3): qty 1

## 2021-08-16 MED ORDER — UMECLIDINIUM BROMIDE 62.5 MCG/ACT IN AEPB
1.0000 | INHALATION_SPRAY | Freq: Every day | RESPIRATORY_TRACT | Status: DC
Start: 2021-08-16 — End: 2021-08-19
  Administered 2021-08-16: 1 via RESPIRATORY_TRACT
  Filled 2021-08-16: qty 7

## 2021-08-16 MED ORDER — LEVETIRACETAM 250 MG PO TABS
250.0000 mg | ORAL_TABLET | Freq: Every day | ORAL | Status: DC
  Administered 2021-08-16 – 2021-08-18 (×3): 250 mg via ORAL
  Filled 2021-08-16 (×3): qty 1

## 2021-08-16 MED ORDER — LEVOTHYROXINE SODIUM 25 MCG PO TABS
25.0000 ug | ORAL_TABLET | Freq: Every day | ORAL | Status: DC
  Administered 2021-08-16 – 2021-08-19 (×4): 25 ug via ORAL
  Filled 2021-08-16 (×4): qty 1

## 2021-08-16 MED ORDER — HALOPERIDOL LACTATE 5 MG/ML IJ SOLN
2.0000 mg | Freq: Once | INTRAMUSCULAR | Status: AC
  Administered 2021-08-16: 2 mg via INTRAMUSCULAR

## 2021-08-16 MED ORDER — FLUTICASONE FUROATE-VILANTEROL 100-25 MCG/ACT IN AEPB
1.0000 | INHALATION_SPRAY | Freq: Every day | RESPIRATORY_TRACT | Status: DC
  Administered 2021-08-16 – 2021-08-19 (×2): 1 via RESPIRATORY_TRACT
  Filled 2021-08-16 (×2): qty 28

## 2021-08-16 MED ORDER — MIDODRINE HCL 5 MG PO TABS
2.5000 mg | ORAL_TABLET | Freq: Two times a day (BID) | ORAL | Status: DC
  Administered 2021-08-16 – 2021-08-19 (×6): 2.5 mg via ORAL
  Filled 2021-08-16 (×6): qty 1

## 2021-08-16 MED ORDER — MONTELUKAST SODIUM 10 MG PO TABS
10.0000 mg | ORAL_TABLET | Freq: Every day | ORAL | Status: DC
Start: 2021-08-16 — End: 2021-08-19
  Administered 2021-08-16 – 2021-08-19 (×4): 10 mg via ORAL
  Filled 2021-08-16 (×4): qty 1

## 2021-08-16 NOTE — Progress Notes (Signed)
PROGRESS NOTE ? ?Brief Narrative: ?Deborah [name] is a 88 y.o. female with a history of Alzheimer's dementia, CAD, permanent AFib not on anticoagulation, HFpEF, HTN, HLD, COPD, and seizure disorder who was [facility] from [facility] on [facility] to the ED with shortness of breath. She was found to be tachypneic with hypotension, with macrocytic anemia (hgb 6.1g/dl from prior in 8's), thrombocytosis, and AKI (SCr 1.21 from baseline of 0.6-0.8). CXR was nonacute. 1u PRBCs ordered and patient admitted for symptomatic recurrent anemia this morning by Dr. Josephine Cables with GI consultation pending.  ? ?Subjective: ?Pt confused, though states she needs assistance with BM. No shortness of breath, abdominal pain, nausea or vomiting when asked.  ? ?Objective: ?BP 97/63 (BP Location: Left Arm)   Pulse 84   Temp 98.3 ?F (36.8 ?C)   Resp 20   Ht 5\' 8"  (1.727 m)   Wt 65 kg   SpO2 97%   BMI 21.79 kg/m?   ?Gen: Elderly, no distress ?Pulm: Clear but diffusely diminished, nonlabored with oxygen nasal cannula taken off, around her neck.  ?CV: Irreg irreg, no murmur, no JVD, no significant edema ?GI: Soft, NT, ND, +BS  ?Neuro: Alert and interactive. Disoriented. No focal deficits. ?Skin: No rashes, lesions or ulcers on visualized skin. ? ?Assessment & Plan: ?Symptomatic macrocytic anemia: 123456 and folic acid wnl/elevated. Recent iron panel w/elevated ferritin, iron, and 47% sat w/TIBC 384.  ?- s/p 1u PRBCs, recheck H/H shows improvement to 7.8g/dl. Will monitor serially.  ?- Continue empiric PPI ?- GI has been consulted to consider inpatient evaluation for GI bleeding given her recurrent anemia. No gross bleeding currently noted, though BUN is elevated. FOBT pending. Of course, pt's age and comorbidities place her at an elevated risk for periprocedural complications.  ? ?Acute hypoxic respiratory failure: ?May be more chronic and currently no evidence of respiratory distress or acute findings on CXR. LVEF normal without RWMA on echo last  month.  ?- Continue supplemental oxygen if needed to maintain SpO2 >89%. With hx COPD, would not need to aim for higher than 95% unless in respiratory distress.  ?- Continue controller COPD BDs, singulair, and prn albuterol. ? ?Chronic HFpEF: Does not appear volume overloaded. BNP is chronically elevated. Additionally, AKI and suspected poor oral intake would argue against acute exacerbation.  ?- Continue to monitor I/O, volume status clinically. Holding lasix 20mg  daily for now.  ? ?AKI: Improving. Continue monitoring, avoiding nephrotoxins.   ? ?Permanent atrial fibrillation: Initially with slow ventricular response in mid-40's on ECG, later up to rate in 80's.  ?- Continue digoxin. If BP improves, can restart home diltiazem. Will reorder home midodrine. Check cortisol in AM (had steroid burst/taper).  ? ?Seizure disorder:  ?- Continue keppra.  ? ?Hypothyroidism:  ?- Continue synthroid ? ?Patrecia Pour, MD ?Pager on amion ?08/16/2021, 3:22 PM   ?

## 2021-08-16 NOTE — Progress Notes (Signed)
Pt currently lying in bed, bed alarm on for safety. Pt denies c/o, oriented to person only, pleasant but continues to refuse to let me complete physical assessment on her. ?
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
## 2021-08-16 NOTE — H&P (Addendum)
?History and Physical  ? ? ?Patient: Deborah [name] [id number] DOB: April 07, 1926 ?DOA: 08/16/2021 ?DOS: the patient was seen and examined on 08/16/2021 ?PCP: Lynnell Catalan, FNP  ?Patient coming from: SNF ? ?Chief Complaint:  ?Chief Complaint  ?Patient presents with  ? Shortness of Breath  ? ?HPI: Deborah [name] is a 88 y.o. female with medical history significant for Alzheimer's dementia, coronary artery disease, permanent atrial fibrillation, diastolic CHF, hypertension, hyperlipidemia, COPD and seizure order who presents to the emergency department via EMS due to shortness of breath.  Patient was unable to provide details regarding the history, history was obtained from ED physician and ED medical record.  Per report, nursing staff at [facility] activated EMS due to concern for shortness of breath. ?Patient was admitted from 2/03/24/2014 due to acute respiratory failure with hypoxia and symptomatic anemia during which patient presented with hemoglobin of 5.8 and 2 units of PRBC was ordered and transfused, GI was consulted and an outpatient EGD/colonoscopy was planned at that time, patient's hemoglobin improved to 8-9 range prior to discharge. ? ? ?ED Course:  ?In the emergency department, she was tachypneic, BP was soft at 83/56, O2 sat was 89% on room air.  Other vital signs were within normal range.  Work-up in the ED shows macrocytic anemia thrombocytosis, elevated MCV-104.9, BUN to creatinine 13/1.21 (baseline creatinine is 0.6-0.8), BNP 288 (this was 156 on 07/03/2021), troponin x2 was flat at 14.  Influenza A, B, SARS coronavirus 2 was negative. ?Chest x-ray showed chronic changes without acute abnormality ?1 unit of PRBC was ordered to be transfused in the ED.  Hospitalist was asked to admit patient for further evaluation and management. ? ?Review of Systems: ?Review of systems as noted in the HPI. All other systems reviewed and are negative. ? ? ?Past Medical History:  ?Diagnosis Date  ? Alzheimer disease  ([place])   ? Asthma   ? Atrial fibrillation ([place])   ? Not anticoagulated with history of bleeding and fall risk  ? Chronic diastolic heart failure ([place])   ? COPD (chronic obstructive pulmonary disease) ([place])   ? Coronary atherosclerosis of native coronary artery   ? BMS circumflex 2009  ? Dementia without behavioral disturbance ([place])   ? Drug intolerance   ? Flecacinide and Amiodarone   ? Fracture of multiple pubic rami ([place]) 12/13  ? Left superior and inferior - following fall  ? Hypotension   ? Mixed hyperlipidemia   ? Osteoarthritis   ? Retroperitoneal hemorrhage 12/13  ? Following fall  ? Seizures ([place])   ? Remote history of over 20 years ago  ? Umbilical hernia   ? ?Past Surgical History:  ?Procedure Laterality Date  ? CATARACT EXTRACTION    ? Radiofrequency catheter ablation    ? Failed  ? Right carpel tunnel release    ? TONSILLECTOMY    ? ? ?Social History:  reports that she quit smoking about 31 years ago. Her smoking use included cigarettes. She has a 20.00 pack-year smoking history. She has never used smokeless tobacco. She reports that she does not drink alcohol and does not use drugs. ? ? ?Allergies  ?Allergen Reactions  ? Ivp Dye [Iodinated Contrast Media] Shortness Of Breath  ?  ??  ? Omnipaque [Iohexol] Shortness Of Breath and Other (See Comments)  ?  Short of breath with chest tightness after IV injection in CT, pt was fine when she left department but developed symptoms in parking lot and went to emergency department.  ?  Sulfa Antibiotics Shortness Of Breath  ? Amiodarone   ? Carbamazepine   ?  REACTION: toxemia  ? Dilaudid [Hydromorphone] Nausea And Vomiting  ? Tramadol Other (See Comments)  ?  confusion  ? ? ?Family History  ?Problem Relation Age of Onset  ? Coronary artery disease Mother   ? Tuberculosis Paternal Grandfather   ? Heart attack Maternal Grandmother   ?  ? ?Prior to Admission medications   ?Medication Sig Start Date End Date Taking? Authorizing Provider  ?albuterol (PROVENTIL) (2.5  MG/3ML) 0.083% nebulizer solution Take 3 mLs (2.5 mg total) by nebulization every 4 (four) hours as needed for wheezing or shortness of breath. 07/07/21 07/07/22  Kathie Dike, MD  ?ALPRAZolam Duanne Moron) 0.25 MG tablet Take 1 tablet (0.25 mg total) by mouth every 8 (eight) hours as needed for anxiety. 07/21/19   Orson Eva, MD  ?D-5000 125 MCG (5000 UT) TABS Take 5,000 Units by mouth daily. 11/11/20   [provider]  ?digoxin (LANOXIN) 0.125 MG tablet Take 1 tablet (0.125 mg total) by mouth every evening. 09/21/16   Isaac Bliss, Rayford Halsted, MD  ?diltiazem [facility]) 120 MG 24 hr capsule Take 120 mg by mouth daily. 06/18/20   [provider]  ?docusate sodium (COLACE) 100 MG capsule Take 100 mg by mouth at bedtime.    [provider]  ?famotidine (PEPCID) 20 MG tablet Take 1 tablet (20 mg total) by mouth at bedtime. 09/02/20   Barton Dubois, MD  ?fluticasone [facility]) 50 MCG/ACT nasal spray Place 2 sprays into both nostrils daily.    [provider]  ?furosemide (LASIX) 20 MG tablet Take 1 tablet (20 mg total) by mouth daily. 07/07/21   Kathie Dike, MD  ?isosorbide mononitrate (IMDUR) 30 MG 24 hr tablet Take 0.5 tablets (15 mg total) by mouth daily. 09/03/17   Kathie Dike, MD  ?levETIRAcetam (KEPPRA) 250 MG tablet Take 125-250 mg by mouth See admin instructions. One tablet at 0900 and half a tablet at 1700    [provider]  ?levothyroxine (SYNTHROID, LEVOTHROID) 25 MCG tablet Take 25 mcg by mouth daily before breakfast.    [provider]  ?melatonin 5 MG TABS Take 5 mg by mouth at bedtime.    [provider]  ?midodrine (PROAMATINE) 2.5 MG tablet Take 1 tablet (2.5 mg total) by mouth 2 (two) times daily with a meal. The dosing can be titrated down to once daily when her systolic blood pressure is consistently in the 100s. 05/04/12   Rexene Alberts, MD  ?montelukast (SINGULAIR) 10 MG tablet Take 10 mg by mouth daily.  09/13/17   [provider]   ?Multiple Vitamins-Minerals (ICAPS MV) TABS Take 1 tablet by mouth in the morning and at bedtime.    [provider]  ?pantoprazole (PROTONIX) 40 MG tablet Take 1 tablet (40 mg total) by mouth daily. 07/08/21   Kathie Dike, MD  ?potassium chloride (K-DUR,KLOR-CON) 10 MEQ tablet Take 10 mEq by mouth every other day. 05/27/18   [provider]  ?predniSONE (DELTASONE) 10 MG tablet Take 40mg  po daily for 2 days then 30mg  daily for 2 days then 20mg  daily for 2 days then 10mg  daily for 2 days then stop 07/07/21   Kathie Dike, MD  ?sertraline (ZOLOFT) 25 MG tablet Take 25 mg by mouth daily.    [provider]  ?Donnal Debar 100-62.5-25 MCG/INH AEPB Inhale 1 puff into the lungs daily. 07/07/20   [provider]  ? ? ?Physical Exam: ?BP Marland Kitchen)  102/91 (BP Location: Left Arm)   Pulse 83   Temp 98 ?F (36.7 ?C) (Oral)   Resp (!) 22   Ht 5\' 8"  (1.727 m)   Wt 65 kg   SpO2 94%   BMI 21.79 kg/m?  ? ?General: 88 y.o. year-old female well developed well nourished in no acute distress.  Alert and oriented x3. ?HEENT: NCAT, EOMI ?Neck: Supple, trachea medial ?Cardiovascular: Regular rate and rhythm with no rubs or gallops.  No thyromegaly or JVD noted.  No lower extremity edema. 2/4 pulses in all 4 extremities. ?Respiratory: Clear to auscultation with no wheezes or rales. Good inspiratory effort. ?Abdomen: Soft, nontender nondistended with normal bowel sounds x4 quadrants. ?Muskuloskeletal: No cyanosis, clubbing or edema noted bilaterally ?Neuro: CN II-XII intact, strength 5/5 x 4, sensation, reflexes intact ?Skin: No ulcerative lesions noted or rashes ?Psychiatry: Judgement and insight appear normal. Mood is appropriate for condition and setting ?   ?   ?   ?Labs on Admission:  ?Basic Metabolic Panel: ?Recent Labs  ?Lab 08/16/21 ?0038  ?NA 140  ?K 4.3  ?CL 105  ?CO2 28  ?GLUCOSE 117*  ?BUN 31*  ?CREATININE 1.11*  ?CALCIUM 9.0  ? ?Liver Function Tests: ?No results for input(s): AST, ALT,  ALKPHOS, BILITOT, PROT, ALBUMIN in the last 168 hours. ?No results for input(s): LIPASE, AMYLASE in the last 168 hours. ?No results for input(s): AMMONIA in the last 168 hours. ?CBC: ?Recent Labs  ?Lab 03/27/2

## 2021-08-16 NOTE — TOC Initial Note (Signed)
Transition of Care (TOC) - Initial/Assessment Note  ? ? ?Patient Details  ?Name: Deborah [name] ?MRN: [id number] ?Date of Birth: Aug 26, 1925 ? ?Transition of Care (TOC) CM/SW Contact:    ?Villa Herb, LCSWA ?Phone Number: ?08/16/2021, 10:39 AM ? ?Clinical Narrative:                 ?Pt is high risk for readmission. CSW spoke with [facility] ALF facility. Facility states that pt is in memory care. Pt needs assistance with ADLs. Pt is able to ambulate with a walker. Pt does not wear O2 at baseline per facility. TOC to follow for needs.  ? ?Expected Discharge Plan: Assisted Living ?Barriers to Discharge: Continued Medical Work up ? ? ?Patient Goals and CMS Choice ?Patient states their goals for this hospitalization and ongoing recovery are:: Return to ALF ?  ?  ? ?Expected Discharge Plan and Services ?Expected Discharge Plan: Assisted Living ?In-house Referral: Clinical Social Work ?  ?Post Acute Care Choice: Resumption of Svcs/PTA Provider ?Living arrangements for the past 2 months: Assisted Living Facility ?                ?  ?  ?  ?  ?  ?  ?  ?  ?  ?  ? ?Prior Living Arrangements/Services ?Living arrangements for the past 2 months: Assisted Living Facility ?Lives with:: Facility Resident ?Patient language and need for interpreter reviewed:: Yes ?Do you feel safe going back to the place where you live?: Yes      ?Need for Family Participation in Patient Care: Yes (Comment) ?Care giver support system in place?: Yes (comment) ?Current home services: DME (walker) ?Criminal Activity/Legal Involvement Pertinent to Current Situation/Hospitalization: No - Comment as needed ? ?Activities of Daily Living ?  ?  ? ?Permission Sought/Granted ?  ?  ?   ?   ?   ?   ? ?Emotional Assessment ?  ?  ?  ?  ?Alcohol / Substance Use: Not Applicable ?Psych Involvement: No (comment) ? ?Admission diagnosis:  AKI (acute kidney injury) (HCC) [N17.9] ?Symptomatic anemia [D64.9] ?Patient Active Problem List  ? Diagnosis Date Noted  ? Iron  deficiency anemia due to chronic blood loss   ? SOB (shortness of breath)   ? Elevated brain natriuretic peptide (BNP) level 07/04/2021  ? Elevated MCV 07/04/2021  ? Thrombocytosis 07/04/2021  ? Acute respiratory failure with hypoxia (HCC) 07/04/2021  ? Hypothyroidism 07/04/2021  ? GERD (gastroesophageal reflux disease) 07/04/2021  ? Major neurocognitive disorder (HCC) 07/04/2021  ? Symptomatic anemia 07/04/2021  ? Acute metabolic encephalopathy 09/01/2020  ? Generalized weakness 09/01/2020  ? Hypercalcemia 09/01/2020  ? Dehydration 09/01/2020  ? Essential hypertension 09/01/2020  ? Acute pulmonary embolism without acute cor pulmonale (HCC) 07/20/2019  ? Sepsis due to undetermined organism (HCC) 07/18/2019  ? AKI (acute kidney injury) (HCC) 07/18/2019  ? Acute cystitis without hematuria   ? Sepsis (HCC) 07/17/2019  ? COVID-19 virus infection 07/17/2019  ? Chest pain 09/02/2017  ? Compression fracture of body of thoracic vertebra (HCC) 06/22/2017  ? Atrial fibrillation with rapid ventricular response (HCC)   ? Screening for colorectal cancer 03/24/2017  ? Hemorrhoids 03/24/2017  ? Itching in the vaginal area 03/24/2017  ? Vulval thrush 03/24/2017  ? Hypoxemia   ? Alzheimer disease (HCC) 09/13/2016  ? Seizures (HCC) 09/13/2016  ? Hypokalemia 09/13/2016  ? COPD exacerbation (HCC) 09/13/2016  ? Hypotension 04/22/2012  ? CAD S/P percutaneous coronary angioplasty   ? COPD (chronic obstructive pulmonary  disease) (HCC)   ? Permanent atrial fibrillation (HCC) 02/10/2009  ? Acute on chronic diastolic CHF (congestive heart failure) (HCC) 02/10/2009  ? ?PCP:  Marlowe Aschoff, FNP ?Pharmacy:   ?[facility] [place], [place] - [address] [facility]. ?[address] [facility] [place] [address] ?Phone: [phone] Fax: [phone] ? ? ? ? ?Social Determinants of Health (SDOH) Interventions ?  ? ?Readmission Risk Interventions ? ?  08/16/2021  ? 10:38 AM 07/05/2021  ? 11:34 AM  ?Readmission Risk Prevention Plan   ?Transportation Screening Complete Complete  ?HRI or Home Care Consult Complete Complete  ?Social Work Consult for Recovery Care Planning/Counseling Complete Complete  ?Palliative Care Screening Not Applicable Not Applicable  ?Medication Review Oceanographer) Complete Complete  ? ? ? ?

## 2021-08-16 NOTE — Progress Notes (Signed)
Monitor placed back on at 1050. Pt has been taking 02/monitor leads and purewick off. Pt has kept monitor on since replaced. Pt still attempting to get out of bed but too weak to do anyhting other than sit up in bed. Bed alarm in placed. Reoriented multiple times. 02 replaced as well.  ?

## 2021-08-16 NOTE — ED Notes (Signed)
Phlebotomy at bedside collecting blood specimens at this time ?

## 2021-08-16 NOTE — Consult Note (Signed)
? ?Gastroenterology Consult  ? ?Referring Provider: No ref. provider found ?Primary Care Physician:  Lynnell Catalan, FNP ?Primary Gastroenterologist:  Dr. Laural Golden (previously) ? ?Patient ID: Deborah [name]; [id number]; 1925/08/30  ? ?Admit date: 08/16/2021 ? LOS: 0 days  ? ?Date of Consultation: 08/16/2021 ? ?Reason for Consultation: Symptomatic anemia ? ?History of Present Illness  ? ?Deborah [name] is a 88 y.o. year old female with history of Alzheimer's disease, asthma, A-fib (previously on Eliquis), chronic diastolic heart failure, COPD, CAD s/p stent in 2009, seizures, HLD, and osteoarthritis who presented to the ED via EMS from [facility] (skilled nursing facility) with complaints of shortness of breath.  Patient was recently hospitalized 07/03/21 due to acute respiratory failure with hypoxia and symptomatic anemia and was discharged to have outpatient GI work-up. She presented with hypotension and tachypnea and O2 sat 89% on room air.  Work-up in the ED shows microcytic anemia and thrombocytosis.  GI reconsulted this admission for anemia. ? ?Patient was previously admitted to 07/03/21 with acute respiratory failure and found to have symptomatic anemia.  Her hemoglobin on this mission was 5.8, she received 2 units of PRBCs and was discharged with a hemoglobin 8-9.  Outpatient GI work-up with EGD/colonoscopy recommended and was set up for outpatient visit 09/16/2021.  She has not had any lab work post discharge.  ? ?ED Course: ?BUN 13, creatinine 1.21 ? ? ?Consult:  ?Patient has Alzheimer's disease therefore history is difficult to obtain.  Patient denied abdominal pain, early satiety, nausea, vomiting, dysphagia, diarrhea, constipation, melena, or hematochezia.  She reports at the time she is not feeling short of breath.  She does not remember why they brought her to the hospital. ? ?I spoke with patient's daughter who reports that the patient has been pale since she left the hospital and seemed more pale yesterday  when she saw her.  When she was visiting she did not complain of any shortness of breath at that time and has not noticed her being short of breath at all prior to her last hospitalization or since her discharge.  It appears to her that she has only been short of breath when she has been severely anemic.  She reports that she has denied seeing any melena or hematochezia while she has been visiting her in the nursing home has not reported any of this to her. ? ? ?Past Medical History:  ?Diagnosis Date  ? Alzheimer disease ([place])   ? Asthma   ? Atrial fibrillation ([place])   ? Not anticoagulated with history of bleeding and fall risk  ? Chronic diastolic heart failure ([place])   ? COPD (chronic obstructive pulmonary disease) ([place])   ? Coronary atherosclerosis of native coronary artery   ? BMS circumflex 2009  ? Dementia without behavioral disturbance ([place])   ? Drug intolerance   ? Flecacinide and Amiodarone   ? Fracture of multiple pubic rami ([place]) 12/13  ? Left superior and inferior - following fall  ? Hypotension   ? Mixed hyperlipidemia   ? Osteoarthritis   ? Retroperitoneal hemorrhage 12/13  ? Following fall  ? Seizures ([place])   ? Remote history of over 20 years ago  ? Umbilical hernia   ? ? ?Past Surgical History:  ?Procedure Laterality Date  ? CATARACT EXTRACTION    ? Radiofrequency catheter ablation    ? Failed  ? Right carpel tunnel release    ? TONSILLECTOMY    ? ? ?Prior to Admission medications   ?Medication Sig  Start Date End Date Taking? Authorizing Provider  ?albuterol (PROVENTIL) (2.5 MG/3ML) 0.083% nebulizer solution Take 3 mLs (2.5 mg total) by nebulization every 4 (four) hours as needed for wheezing or shortness of breath. 07/07/21 07/07/22 Yes Kathie Dike, MD  ?ALPRAZolam Duanne Moron) 0.25 MG tablet Take 1 tablet (0.25 mg total) by mouth every 8 (eight) hours as needed for anxiety. 07/21/19  Yes Tat, Shanon Brow, MD  ?D-5000 125 MCG (5000 UT) TABS Take 5,000 Units by mouth daily. 11/11/20  Yes [provider]   ?digoxin (LANOXIN) 0.125 MG tablet Take 1 tablet (0.125 mg total) by mouth every evening. 09/21/16  Yes Isaac Bliss, Rayford Halsted, MD  ?diltiazem [facility]) 120 MG 24 hr capsule Take 120 mg by mouth daily. 06/18/20  Yes [provider]  ?docusate sodium (COLACE) 100 MG capsule Take 100 mg by mouth at bedtime.   Yes [provider]  ?famotidine (PEPCID) 20 MG tablet Take 1 tablet (20 mg total) by mouth at bedtime. 09/02/20  Yes Barton Dubois, MD  ?fluticasone (FLONASE) 50 MCG/ACT nasal spray Place 2 sprays into both nostrils daily.   Yes [provider]  ?furosemide (LASIX) 20 MG tablet Take 1 tablet (20 mg total) by mouth daily. 07/07/21  Yes Kathie Dike, MD  ?isosorbide mononitrate (IMDUR) 30 MG 24 hr tablet Take 0.5 tablets (15 mg total) by mouth daily. 09/03/17  Yes Kathie Dike, MD  ?levETIRAcetam (KEPPRA) 250 MG tablet Take 125-250 mg by mouth See admin instructions. One tablet at 0900 and half a tablet at 1700   Yes [provider]  ?levothyroxine (SYNTHROID, LEVOTHROID) 25 MCG tablet Take 25 mcg by mouth daily before breakfast.   Yes [provider]  ?melatonin 5 MG TABS Take 5 mg by mouth at bedtime.   Yes [provider]  ?midodrine (PROAMATINE) 2.5 MG tablet Take 1 tablet (2.5 mg total) by mouth 2 (two) times daily with a meal. The dosing can be titrated down to once daily when her systolic blood pressure is consistently in the 100s. 05/04/12  Yes Rexene Alberts, MD  ?montelukast (SINGULAIR) 10 MG tablet Take 10 mg by mouth daily.  09/13/17  Yes [provider]  ?Multiple Vitamins-Minerals (ICAPS MV) TABS Take 1 tablet by mouth in the morning and at bedtime.   Yes [provider]  ?pantoprazole (PROTONIX) 40 MG tablet Take 1 tablet (40 mg total) by mouth daily. 07/08/21  Yes Kathie Dike, MD  ?potassium chloride (K-DUR,KLOR-CON) 10 MEQ tablet Take 10 mEq by mouth every other day. 05/27/18  Yes [provider]  ?sertraline  (ZOLOFT) 25 MG tablet Take 25 mg by mouth daily.   Yes [provider]  ?TRELEGY ELLIPTA 100-62.5-25 MCG/INH AEPB Inhale 1 puff into the lungs daily. 07/07/20  Yes [provider]  ?predniSONE (DELTASONE) 10 MG tablet Take 40mg  po daily for 2 days then 30mg  daily for 2 days then 20mg  daily for 2 days then 10mg  daily for 2 days then stop ?Patient not taking: Reported on 08/16/2021 07/07/21   Kathie Dike, MD  ? ? ?Current Facility-Administered Medications  ?Medication Dose Route Frequency Provider Last Rate Last Admin  ? albuterol (PROVENTIL) (2.5 MG/3ML) 0.083% nebulizer solution 2.5 mg  2.5 mg Nebulization Q4H PRN Adefeso, Oladapo, DO      ? digoxin (LANOXIN) tablet 0.125 mg  0.125 mg Oral QPM Adefeso, Oladapo, DO      ? fluticasone furoate-vilanterol (BREO ELLIPTA) 100-25 MCG/ACT 1 puff  1 puff Inhalation Daily Adefeso, Oladapo, DO  1 puff at 08/16/21 0804  ? And  ? umeclidinium bromide (INCRUSE ELLIPTA) 62.5 MCG/ACT 1 puff  1 puff Inhalation Daily Adefeso, Oladapo, DO   1 puff at 08/16/21 0804  ? levothyroxine (SYNTHROID) tablet 25 mcg  25 mcg Oral QAC breakfast Adefeso, Oladapo, DO      ? montelukast (SINGULAIR) tablet 10 mg  10 mg Oral Daily Adefeso, Oladapo, DO      ? ? ?Allergies as of 08/16/2021 - Review Complete 08/16/2021  ?Allergen Reaction Noted  ? Ivp dye [iodinated contrast media] Shortness Of Breath 07/14/2014  ? Omnipaque [iohexol] Shortness Of Breath and Other (See Comments) 06/01/2011  ? Sulfa antibiotics Shortness Of Breath 07/14/2014  ? Amiodarone  07/17/2019  ? Carbamazepine    ? Dilaudid [hydromorphone] Nausea And Vomiting 07/14/2014  ? Tramadol Other (See Comments) 05/19/2011  ? ? ?Family History  ?Problem Relation Age of Onset  ? Coronary artery disease Mother   ? Tuberculosis Paternal Grandfather   ? Heart attack Maternal Grandmother   ? ? ?Social History  ? ?Socioeconomic History  ? Marital status: Widowed  ?  Spouse name: Not on file  ? Number of children: Not on file  ?  Years of education: Not on file  ? Highest education level: Not on file  ?Occupational History  ? Not on file  ?Tobacco Use  ? Smoking status: Former  ?  Packs/day: 0.80  ?  Years: 25.00  ?  Pack years:

## 2021-08-16 NOTE — ED Provider Notes (Signed)
?Deborah [name] ?Provider Note ? ? ?CSN: [id number] ?Arrival date & time: 08/16/21  0001 ? ?  ? ?History ? ?Chief Complaint  ?Patient presents with  ? Shortness of Breath  ? ? ?Deborah [name] is a 88 y.o. female. ? ?HPI ? ?  ? ?This is a 88 year old female who presents from [place] with concerns of shortness of breath.  On my evaluation, patient is disoriented at her baseline.  She does not report any symptoms to me.  She denies chest pain, shortness of breath, abdominal pain.  She is only oriented to herself and place. ? ?Level 5 caveat for dementia ? ?Patient's daughter is at the bedside.  She is DNR.  She would want blood products and work-up.  Reports that she had blood transfusions in February and is following up with gastroenterology as an outpatient.  Endoscopy to be scheduled if patient needed ongoing transfusions.  No known active bleeding or melena.  Not currently on any blood thinners.  Had digoxin listed as prior med but not on current MAR. ? ? ?Home Medications ?Prior to Admission medications   ?Medication Sig Start Date End Date Taking? Authorizing Provider  ?albuterol (PROVENTIL) (2.5 MG/3ML) 0.083% nebulizer solution Take 3 mLs (2.5 mg total) by nebulization every 4 (four) hours as needed for wheezing or shortness of breath. 07/07/21 07/07/22  Erick Blinks, MD  ?ALPRAZolam [name]) 0.25 MG tablet Take 1 tablet (0.25 mg total) by mouth every 8 (eight) hours as needed for anxiety. 07/21/19   [name], MD  ?D-5000 125 MCG (5000 UT) TABS Take 5,000 Units by mouth daily. 11/11/20   [provider]  ?digoxin (LANOXIN) 0.125 MG tablet Take 1 tablet (0.125 mg total) by mouth every evening. 09/21/16   [name], [name], MD  ?diltiazem [facility]) 120 MG 24 hr capsule Take 120 mg by mouth daily. 06/18/20   [provider]  ?docusate sodium (COLACE) 100 MG capsule Take 100 mg by mouth at bedtime.    [provider]  ?famotidine (PEPCID) 20 MG tablet Take 1 tablet  (20 mg total) by mouth at bedtime. 09/02/20   [name], MD  ?fluticasone [facility]) 50 MCG/ACT nasal spray Place 2 sprays into both nostrils daily.    [provider]  ?furosemide (LASIX) 20 MG tablet Take 1 tablet (20 mg total) by mouth daily. 07/07/21   Erick Blinks, MD  ?isosorbide mononitrate (IMDUR) 30 MG 24 hr tablet Take 0.5 tablets (15 mg total) by mouth daily. 09/03/17   Erick Blinks, MD  ?levETIRAcetam (KEPPRA) 250 MG tablet Take 125-250 mg by mouth See admin instructions. One tablet at 0900 and half a tablet at 1700    [provider]  ?levothyroxine (SYNTHROID, LEVOTHROID) 25 MCG tablet Take 25 mcg by mouth daily before breakfast.    [provider]  ?melatonin 5 MG TABS Take 5 mg by mouth at bedtime.    [provider]  ?midodrine (PROAMATINE) 2.5 MG tablet Take 1 tablet (2.5 mg total) by mouth 2 (two) times daily with a meal. The dosing can be titrated down to once daily when her systolic blood pressure is consistently in the 100s. 05/04/12   [name], MD  ?montelukast (SINGULAIR) 10 MG tablet Take 10 mg by mouth daily.  09/13/17   [provider]  ?Multiple Vitamins-Minerals (ICAPS MV) TABS Take 1 tablet by mouth in the morning and at bedtime.    [provider]  ?pantoprazole (PROTONIX) 40 MG tablet Take 1  tablet (40 mg total) by mouth daily. 07/08/21   Erick Blinks, MD  ?potassium chloride (K-DUR,KLOR-CON) 10 MEQ tablet Take 10 mEq by mouth every other day. 05/27/18   [provider]  ?predniSONE (DELTASONE) 10 MG tablet Take 40mg  po daily for 2 days then 30mg  daily for 2 days then 20mg  daily for 2 days then 10mg  daily for 2 days then stop 07/07/21   , MD  ?sertraline (ZOLOFT) 25 MG tablet Take 25 mg by mouth daily.    [provider]  ? 100-62.5-25 MCG/INH AEPB Inhale 1 puff into the lungs daily. 07/07/20   [provider]  ?   ? ?Allergies    ?Ivp dye [iodinated contrast  media], Omnipaque [iohexol], Sulfa antibiotics, Amiodarone, Carbamazepine, Dilaudid [hydromorphone], and Tramadol   ? ?Review of Systems   ?Review of Systems  ?Unable to perform ROS: Dementia  ? ?Physical Exam ?Updated Vital Signs ?BP (!) 90/52   Pulse (!) 56   Temp 97.9 ?F (36.6 ?C)   Resp (!) 25   Ht 1.727 m (5\' 8" )   Wt 65 kg   SpO2 92%   BMI 21.79 kg/m?  ?Physical Exam ?Vitals and nursing note reviewed.  ?Constitutional:   ?   Appearance: She is well-developed.  ?   Comments: Elderly chronically ill-appearing  ?HENT:  ?   Head: Normocephalic and atraumatic.  ?Eyes:  ?   Pupils: Pupils are equal, round, and reactive to light.  ?Cardiovascular:  ?   Rate and Rhythm: Normal rate. Rhythm irregular.  ?   Heart sounds: Murmur heard.  ?Pulmonary:  ?   Effort: Pulmonary effort is normal. No respiratory distress.  ?   Breath sounds: No wheezing.  ?Abdominal:  ?   General: Bowel sounds are normal.  ?   Palpations: Abdomen is soft.  ?Musculoskeletal:  ?   Cervical back: Neck supple.  ?   Right lower leg: Edema present.  ?   Left lower leg: Edema present.  ?Skin: ?   General: Skin is warm and dry.  ?Neurological:  ?   Mental Status: She is alert.  ?   Comments: Oriented to herself and place  ?Psychiatric:     ?   Mood and Affect: Mood normal.  ? ? ?ED Results / Procedures / Treatments   ?Labs ?(all labs ordered are listed, but only abnormal results are displayed) ?Labs Reviewed  ?CBC WITH DIFFERENTIAL/PLATELET - Abnormal; Notable for the following components:  ?    Result Value  ? RBC 1.83 (*)   ? Hemoglobin 6.1 (*)   ? HCT 19.2 (*)   ? MCV 104.9 (*)   ? RDW 21.1 (*)   ? Platelets 568 (*)   ? All other components within normal limits  ?BASIC METABOLIC PANEL - Abnormal; Notable for the following components:  ? Glucose, Bld 117 (*)   ? BUN 31 (*)   ? Creatinine, Ser 1.11 (*)   ? GFR, Estimated 46 (*)   ? All other components within normal limits  ?BRAIN NATRIURETIC PEPTIDE - Abnormal; Notable for the following  components:  ? B Natriuretic Peptide 288.0 (*)   ? All other components within normal limits  ?RESP PANEL BY RT-PCR (FLU A&B, COVID) ARPGX2  ?DIGOXIN LEVEL  ?TYPE AND SCREEN  ?PREPARE RBC (CROSSMATCH)  ?TROPONIN I (HIGH SENSITIVITY)  ?TROPONIN I (HIGH SENSITIVITY)  ? ? ?EKG ?EKG Interpretation ? ?Date/Time:  Monday August 16 2021 00:15:42 EDT ?Ventricular Rate:  45 ?PR Interval:    ?  QRS Duration: 91 ?QT Interval:  352 ?QTC Calculation: 305 ?R Axis:   46 ?Text Interpretation: Junctional rhythm Consider anterior infarct Nonspecific repol abnormality, diffuse leads Baseline wander in lead(s) II aVR Confirmed by Ross MarcusHorton, Jazziel Fitzsimmons ([id number]) on 08/16/2021 12:35:16 AM ? ?Radiology ?DG Chest Portable 1 View ? ?Result Date: 08/16/2021 ?CLINICAL DATA:  Shortness of breath EXAM: PORTABLE CHEST 1 VIEW COMPARISON:  07/03/2021 FINDINGS: Cardiac shadow is enlarged but stable. Aortic calcifications are again seen. Lungs are well aerated bilaterally. Mild interstitial changes are seen. Chronic blunting of left costophrenic angle is noted. No focal infiltrate is seen. No bony abnormality is noted. IMPRESSION: Chronic changes without acute abnormality. Electronically Signed   By: Alcide CleverMark  Lukens M.D.   On: 08/16/2021 00:47   ? ?Procedures ?Marland Kitchen.Critical Care ?Performed by: Shon BatonHorton, Nereida Schepp F, MD ?Authorized by: Shon BatonHorton, Dollie Mayse F, MD  ? ?Critical care provider statement:  ?  Critical care time (minutes):  45 ?  Critical care was necessary to treat or prevent imminent or life-threatening deterioration of the following conditions: Symptomatic anemia. ?  Critical care was time spent personally by me on the following activities:  Development of treatment plan with patient or surrogate, discussions with consultants, evaluation of patient's response to treatment, examination of patient, ordering and review of laboratory studies, ordering and review of radiographic studies, ordering and performing treatments and interventions, pulse oximetry,  re-evaluation of patient's condition and review of old charts  ? ? ?Medications Ordered in ED ?Medications  ?0.9 %  sodium chloride infusion (has no administration in time range)  ? ? ?ED Course/ Medical Decision Making/ A&P ?  ?

## 2021-08-16 NOTE — ED Triage Notes (Signed)
[facility] from [facility] [facility]. Cc of shortness of breath. Pt was 95% on arrival. Pt has DNR form at bedside.  ?98% on 3l. 94% on room air.  ?States she wears oxygen "sometimes"  ?

## 2021-08-16 NOTE — ED Notes (Signed)
Date and time results received: 08/16/21 0100 ? ? ?Test: Hgb ?Critical Value: 6.1 ? ?Name of Provider Notified: Horton, C MD ? ? ?

## 2021-08-16 NOTE — Progress Notes (Signed)
Pt arrived to room 341 via chair from second floor. Pt with chair alarm on for safety, O2 @ 3lpm [place] on. Call bell placed in pt's lap and instructed on use. Pt refuses to tell me her birthday or answer any other LOC type questions. Pt also refuses to allow me to assess her. Pt states, "I know the answers, I just choose not to answer you right now." ?

## 2021-08-17 ENCOUNTER — Encounter (HOSPITAL_COMMUNITY)
Admission: EM | Disposition: A | Discharge status: Home Health | Payer: Self-pay | Source: Skilled Nursing Facility | Attending: Family Medicine

## 2021-08-17 ENCOUNTER — Inpatient Hospital Stay (HOSPITAL_COMMUNITY): Payer: Medicare Other

## 2021-08-17 ENCOUNTER — Encounter (HOSPITAL_COMMUNITY): Payer: Self-pay | Admitting: Anesthesiology

## 2021-08-17 DIAGNOSIS — J9601 Acute respiratory failure with hypoxia: Secondary | ICD-10-CM | POA: Diagnosis not present

## 2021-08-17 DIAGNOSIS — D649 Anemia, unspecified: Secondary | ICD-10-CM | POA: Diagnosis not present

## 2021-08-17 DIAGNOSIS — J439 Emphysema, unspecified: Secondary | ICD-10-CM

## 2021-08-17 DIAGNOSIS — I4821 Permanent atrial fibrillation: Secondary | ICD-10-CM

## 2021-08-17 DIAGNOSIS — K219 Gastro-esophageal reflux disease without esophagitis: Secondary | ICD-10-CM

## 2021-08-17 DIAGNOSIS — N179 Acute kidney failure, unspecified: Secondary | ICD-10-CM | POA: Diagnosis not present

## 2021-08-17 DIAGNOSIS — R7989 Other specified abnormal findings of blood chemistry: Secondary | ICD-10-CM | POA: Diagnosis not present

## 2021-08-17 DIAGNOSIS — E039 Hypothyroidism, unspecified: Secondary | ICD-10-CM

## 2021-08-17 LAB — BASIC METABOLIC PANEL
Anion gap: 8 (ref 5–15)
BUN: 22 mg/dL (ref 8–23)
CO2: 27 mmol/L (ref 22–32)
Calcium: 8.8 mg/dL — ABNORMAL LOW (ref 8.9–10.3)
Chloride: 104 mmol/L (ref 98–111)
Creatinine, Ser: 0.72 mg/dL (ref 0.44–1.00)
GFR, Estimated: >60 mL/min (ref >60)
Glucose, Bld: 100 mg/dL — ABNORMAL HIGH (ref 70–99)
Potassium: 4 mmol/L (ref 3.5–5.1)
Sodium: 139 mmol/L (ref 135–145)

## 2021-08-17 LAB — CBC
HCT: 24.2 % — ABNORMAL LOW (ref 36.0–46.0)
Hemoglobin: 7.8 g/dL — ABNORMAL LOW (ref 12.0–15.0)
MCH: 32.4 pg (ref 26.0–34.0)
MCHC: 32.2 g/dL (ref 30.0–36.0)
MCV: 100.4 fL — ABNORMAL HIGH (ref 80.0–100.0)
Platelets: 504 10*3/uL — ABNORMAL HIGH (ref 150–400)
RBC: 2.41 MIL/uL — ABNORMAL LOW (ref 3.87–5.11)
RDW: 21.3 % — ABNORMAL HIGH (ref 11.5–15.5)
WBC: 6.5 10*3/uL (ref 4.0–10.5)
nRBC: 0 % (ref 0.0–0.2)

## 2021-08-17 LAB — BLOOD GAS, ARTERIAL
Acid-Base Excess: 2.8 mmol/L — ABNORMAL HIGH (ref 0.0–2.0)
Bicarbonate: 28.5 mmol/L — ABNORMAL HIGH (ref 20.0–28.0)
Drawn by: 38235
FIO2: 36 %
O2 Saturation: 98.7 %
Patient temperature: 37
pCO2 arterial: 47 mmHg (ref 32–48)
pH, Arterial: 7.39 (ref 7.35–7.45)
pO2, Arterial: 142 mmHg — ABNORMAL HIGH (ref 83–108)

## 2021-08-17 LAB — TYPE AND SCREEN
ABO/RH(D): A POS
Antibody Screen: NEGATIVE
Unit division: 0

## 2021-08-17 LAB — BPAM RBC
Blood Product Expiration Date: 202304162359
ISSUE DATE / TIME: 202303270241
Unit Type and Rh: 6200

## 2021-08-17 SURGERY — ESOPHAGOGASTRODUODENOSCOPY (EGD) WITH PROPOFOL
Anesthesia: Monitor Anesthesia Care

## 2021-08-17 MED ORDER — FUROSEMIDE 10 MG/ML IJ SOLN
20.0000 mg | Freq: Once | INTRAMUSCULAR | Status: AC
  Administered 2021-08-17: 20 mg via INTRAVENOUS
  Filled 2021-08-17: qty 2

## 2021-08-17 MED ORDER — SALINE SPRAY 0.65 % NA SOLN
1.0000 | NASAL | Status: DC | PRN
  Administered 2021-08-17: 1 via NASAL
  Filled 2021-08-17: qty 44

## 2021-08-17 MED ORDER — PANTOPRAZOLE SODIUM 40 MG IV SOLR
40.0000 mg | Freq: Two times a day (BID) | INTRAVENOUS | Status: DC
  Administered 2021-08-17 – 2021-08-18 (×3): 40 mg via INTRAVENOUS
  Filled 2021-08-17 (×4): qty 10

## 2021-08-17 MED ORDER — ACETAMINOPHEN 325 MG PO TABS
650.0000 mg | ORAL_TABLET | Freq: Four times a day (QID) | ORAL | Status: DC | PRN
  Administered 2021-08-18 – 2021-08-19 (×2): 650 mg via ORAL
  Filled 2021-08-17 (×2): qty 2

## 2021-08-17 MED ORDER — METHYLPREDNISOLONE SODIUM SUCC 40 MG IJ SOLR
40.0000 mg | Freq: Two times a day (BID) | INTRAMUSCULAR | Status: DC
Start: 2021-08-17 — End: 2021-08-19
  Administered 2021-08-17 – 2021-08-19 (×5): 40 mg via INTRAVENOUS
  Filled 2021-08-17 (×5): qty 1

## 2021-08-17 MED ORDER — FUROSEMIDE 10 MG/ML IJ SOLN
20.0000 mg | Freq: Once | INTRAMUSCULAR | Status: AC
Start: 2021-08-17 — End: 2021-08-17
  Administered 2021-08-17: 20 mg via INTRAVENOUS
  Filled 2021-08-17: qty 2

## 2021-08-17 MED ORDER — ALPRAZOLAM 0.25 MG PO TABS
0.2500 mg | ORAL_TABLET | Freq: Three times a day (TID) | ORAL | Status: DC | PRN
  Administered 2021-08-17 – 2021-08-19 (×3): 0.25 mg via ORAL
  Filled 2021-08-17 (×3): qty 1

## 2021-08-17 MED ORDER — IPRATROPIUM BROMIDE 0.02 % IN SOLN: 0.5000 mg | Freq: Four times a day (QID) | RESPIRATORY_TRACT | Status: DC | PRN

## 2021-08-17 NOTE — Progress Notes (Signed)
MD to floor to assess patient.   Patient still has labored breathing, is alert and at baseline mentation. Chest x-ray and ABG ordered.  Lasix given per MD order. ?

## 2021-08-17 NOTE — Progress Notes (Signed)
Noted patient having slight increase work of breathing R-24, O2-95% at 1.5 Liters, upper airway wheeze, diminished lung sounds bilateral. Respiratory aware patient receiving neb treatment. MD Jarvis Newcomer made aware.  ? ?

## 2021-08-17 NOTE — Progress Notes (Signed)
RN called due to patient having increased work of breathing.  At bedside, patient was noted to have accessory muscle use, auditory wheezing without stethoscope noted in the neck area, but lungs were clear.  ABG was done and showed pH 7.3 9/47/142/20 8.5 at FiO2 of 36%.  Chest x-ray was done and showed slight increase in vascular congestion and interstitial edema when compared with the prior exam. ?IV Lasix 20 mg x 1 was given with increased urinary output. ?Patient was started on Solu-Medrol and Atrovent.  Incentive spirometry was started ?Tylenol was given for knee pain. ? ?Total time:  15 minutes ?This includes time reviewing the chart including progress notes, labs, EKGs, taking medical decisions, ordering labs and documenting findings. ? ?

## 2021-08-17 NOTE — Progress Notes (Signed)
? ? ?Subjective: ?Wheezing quite a bit, but states her breathing is about at its baseline. States she has chronic issues with wheezing. States, "I don't feel bad". Denies abdominal pain, nausea, vomiting, brbpr, melena.  ? ?Nursing staff confirmed no overt GI bleeding.  ? ?Spoke with hospitalist who feels patient would benefit from 1 more day of diuresis before procedure.  ? ?Objective: ?Vital signs in last 24 hours: ?Temp:  [97.4 ?F (36.3 ?C)-98.3 ?F (36.8 ?C)] 97.6 ?F (36.4 ?C) (03/28 [id number]) ?Pulse Rate:  [50-90] 88 (03/28 0601) ?Resp:  [18-24] 18 (03/28 0558) ?BP: (116-154)/(52-104) 116/96 (03/28 0558) ?SpO2:  [88 %-99 %] 94 % (03/28 0601) ?  ?General:   Alert, expiratory wheezing, NAD.  ?Head:  Normocephalic and atraumatic. ?Eyes:  No icterus, sclera clear. Conjuctiva pink.  ?Lungs: Course breath sounds, expiratory wheezing.  ?Abdomen:  Bowel sounds present, soft, non-tender, non-distended. Umbilical hernia present, soft, reducible.  No rebound or guarding.  ?Msk:  Symmetrical without gross deformities. Normal posture. ?Extremities:  Without edema. SCDs in place.  ?Neurologic:  Alert and  oriented to self;  grossly normal neurologically. ?Skin:  Warm and dry, intact without significant lesions.  ?Psych:  Normal mood and affect. ? ?Intake/Output from previous day: ?03/27 0701 - 03/28 0700 ?In: 120 [P.O.:120] ?Out: -  ?Intake/Output this shift: ?No intake/output data recorded. ? ?Lab Results: ?Recent Labs  ?  08/16/21 ?0038 08/16/21 ?[id number] 08/17/21 ?0700  ?WBC 5.1 6.2 6.5  ?HGB 6.1* 7.8* 7.8*  ?HCT 19.2* 24.2* 24.2*  ?PLT 568* 564* 504*  ? ?BMET ?Recent Labs  ?  08/16/21 ?0038 08/16/21 ?[id number] 08/17/21 ?0700  ?NA 140 139 139  ?K 4.3 3.9 4.0  ?CL 105 106 104  ?CO2 28 26 27   ?GLUCOSE 117* 146* 100*  ?BUN 31* 27* 22  ?CREATININE 1.11* 0.95 0.72  ?CALCIUM 9.0 8.5* 8.8*  ? ?LFT ?Recent Labs  ?  08/16/21 ?0832  ?PROT 6.4*  ?ALBUMIN 3.8  ?AST 19  ?ALT 7  ?ALKPHOS 84  ?BILITOT 0.8  ? ? ?Studies/Results: ?DG Chest Port 1  View ? ?Result Date: 08/17/2021 ?CLINICAL DATA:  Shortness of breath EXAM: PORTABLE CHEST 1 VIEW COMPARISON:  08/16/2021 FINDINGS: Cardiac shadow remains enlarged. Aortic calcifications are again noted. Lungs are well aerated bilaterally. Slight increase in vascular congestion with interstitial edema is noted. Chronic blunting of left costophrenic angle is noted. No acute bony abnormality is seen. IMPRESSION: Slight increase in vascular congestion and interstitial edema when compared with the prior exam. Electronically Signed   By: Inez Catalina M.D.   On: 08/17/2021 02:11  ? ?DG Chest Portable 1 View ? ?Result Date: 08/16/2021 ?CLINICAL DATA:  Shortness of breath EXAM: PORTABLE CHEST 1 VIEW COMPARISON:  07/03/2021 FINDINGS: Cardiac shadow is enlarged but stable. Aortic calcifications are again seen. Lungs are well aerated bilaterally. Mild interstitial changes are seen. Chronic blunting of left costophrenic angle is noted. No focal infiltrate is seen. No bony abnormality is noted. IMPRESSION: Chronic changes without acute abnormality. Electronically Signed   By: Inez Catalina M.D.   On: 08/16/2021 00:47   ? ?Assessment: ?88 year old female with history of Alzheimer's disease, asthma, A-fib (previously on Eliquis, but not currently), chronic diastolic heart failure, COPD, CAD s/p stent in 2009, seizures, HLD, osteoarthritis, who presented to the ED via EMS from [facility] with complaints of shortness of breath, found to be hypotensive, tachypneic, O2 saturation 89% on room air.  Hemoglobin of 6.1, down from 9.0, 48-month ago. She was recently hospitalized 07/03/21 due  to acute respiratory failure with hypoxia and symptomatic anemia requiring 2 units PRBCs during that admission and was discharged to have outpatient GI work-up.  Echocardiogram during that admission with normal EF.  GI re-consulted this admission for symptomatic anemia.  ? ?Recurrent symptomatic anemia:  ?Hemoglobin 6.1 with MCV 104.9 on admission, down from  9.0, 1 month ago.  Folate 26.6, B12 1530.  No iron panel this admission, previously ferritin 417 (H), iron 181 (H), saturation 47% (H) in February 2022.  She has denied overt GI bleeding and no reports of overt bleeding this admission.  Received 1 unit PRBCs with hemoglobin improved to 7.8 yesterday, stable at 7.8 today.  Due to recurrent symptomatic anemia, we had planned for EGD today and hold off on colonoscopy as she has no overt bleeding.  However, she experienced some respiratory distress overnight, repeat chest x-ray with slight increase in vascular congestion and interstitial edema.  She was given Lasix with improvement in respiratory status.  Case was discussed with hospitalist who agreed that patient would be better optimized for a procedure tomorrow after an additional dose of Lasix today. ? ? ?Plan: ?EGD with propofol with Dr. Dorien Chihuahua tomorrow.  ?Clear liquid diet today. ?N.p.o. at midnight. ?Continue to monitor H&H and for overt GI bleeding.  Transfuse for hemoglobin less than 7. ?Avoid NSAIDs. ?IV PPI twice daily empirically for now. ? ? LOS: 1 day  ? ? 08/17/2021, 9:14 AM ? ? ?Aliene Altes, PA-C ?[facility]  ?

## 2021-08-17 NOTE — Progress Notes (Addendum)
Patient showed increased work of breathing.  Breathing is labored involving accessory muscle usage.  Patient oxygen stable on 3 liters (which is acute since admission to hospital)  Respiratory notified and came to bedside and administered breathing treatment.  Patient has audible upper airway wheezing.  Wheezing became worse after breathing treatment. Patient repositioned in bed and notified MD of patient respiratory status.  MD stated he will  come to floor and assess patient.  ?

## 2021-08-17 NOTE — Progress Notes (Signed)
Patient agitated and pulling oxygen off, yelling at staff. MD Jarvis Newcomer made aware. Mittens placed.  ?

## 2021-08-17 NOTE — Progress Notes (Signed)
?Progress Note ? ?Patient: Deborah [name]:[id number] DOB: Apr 27, 1926  ?DOA: 08/16/2021  DOS: 08/17/2021  ?  ?Brief hospital course: ?Deborah [name] is a 88 y.o. female with a history of Alzheimer's dementia, CAD, permanent AFib not on anticoagulation, HFpEF, HTN, HLD, COPD, and seizure disorder who was sent from [facility] on 3/26 to the ED with shortness of breath. She was found to be tachypneic with hypotension, with macrocytic anemia (hgb 6.1g/dl from prior in 8's), thrombocytosis, and AKI (SCr 1.21 from baseline of 0.6-0.8). CXR was nonacute. 1u PRBCs ordered and patient admitted for symptomatic recurrent anemia. GI recommended EGD which is delayed to 3/29 due to respiratory distress. IV lasix has been given with improvement in respiratory effort.  ? ?Assessment and Plan: ?Symptomatic macrocytic anemia: B12 and folic acid wnl/elevated. Recent iron panel w/elevated ferritin, iron, and 47% sat w/TIBC 384.  ?- s/p 1u PRBCs, recheck H/H shows improvement to 7.8g/dl. Stable on recheck. Repeat CBC in AM.  ?- Continue empiric PPI ?- GI recommending inpatient EGD. Though her respiratory status is improved this morning, since there is no emergent need, I recommended delaying procedure for 24 hours. Ok to start clear liquids today per GI.   ?  ?Acute hypoxic respiratory failure: ?May be acute on chronic. LVEF normal without RWMA on echo last month.  ?- Continue supplemental oxygen if needed to maintain SpO2 >89%. With hx COPD, would not need to aim for higher than 95% unless in respiratory distress. She is on < 1L this AM, improved from respiratory distress emerging overnight with lasix. Continues to have crackles on exam. Will repeat lasix.  ?- Anxiety contributing to upper airway component, restart home xanax. ?- Continue controller COPD BDs, singulair, and prn albuterol. ?  ?Chronic HFpEF: With acute interstitial pulmonary edema ?- Repeat IV lasix this AM (improved with lasix overnight) ?- Continue to monitor I/O,  volume status clinically. ?  ?AKI: Improving.  ?- Continue monitoring, avoiding nephrotoxins.   ?  ?Permanent atrial fibrillation: Initially with slow ventricular response in mid-40's on ECG, later up to rate in 80's.  ?- Continue digoxin. If BP improves, can restart home diltiazem. Continue home midodrine.  ?  ?Seizure disorder:  ?- Continue keppra.  ?  ?Hypothyroidism:  ?- Continue synthroid ? ?Unstageable right heel pressure injury: Unknown whether was POA. Recommend offloading. ? ?Subjective: Breathing better, but still short of breath subjectively. No bleeding noted per pt or staff. Having some delirium. Her main worry is that she's hungry and wants to eat. ? ?Objective: ?Vitals:  ? 08/17/21 0315 08/17/21 0558 08/17/21 0601 08/17/21 1031  ?BP: (!) 138/52 (!) 116/96    ?Pulse: 90 (!) 53 88   ?Resp: (!) 22 18    ?Temp: (!) 97.4 ?F (36.3 ?C) 97.6 ?F (36.4 ?C)    ?TempSrc: Oral     ?SpO2: 95% (!) 88% 94% 95%  ?Weight:      ?Height:      ? ?Gen: Elderly female in no distress ?Pulm: Nonlabored tachypnea with upper airway sounds. Bibasilar crackles without lower wheezes.  ?CV: No new murmur, rub, or gallop. No JVD, trace dependent edema. ?GI: Abdomen soft, non-tender, non-distended, with normoactive bowel sounds.  ?Ext: Warm, no deformities ?Skin: No new rashes, lesions or ulcers on visualized skin. ?Neuro: Alert and incompletely oriented without focal neurological deficits. ?Psych: Calm. ? ?Data Personally reviewed: ?CBC: ?Recent Labs  ?Lab 08/16/21 ?0038 08/16/21 ?0932 08/17/21 ?0700  ?WBC 5.1 6.2 6.5  ?NEUTROABS 4.2  --   --   ?  HGB 6.1* 7.8* 7.8*  ?HCT 19.2* 24.2* 24.2*  ?MCV 104.9* 99.2 100.4*  ?PLT 568* 564* 504*  ? ?Basic Metabolic Panel: ?Recent Labs  ?Lab 08/16/21 ?0038 08/16/21 ?4709 08/17/21 ?0700  ?NA 140 139 139  ?K 4.3 3.9 4.0  ?CL 105 106 104  ?CO2 28 26 27   ?GLUCOSE 117* 146* 100*  ?BUN 31* 27* 22  ?CREATININE 1.11* 0.95 0.72  ?CALCIUM 9.0 8.5* 8.8*  ?MG  --  2.2  --   ?PHOS  --  4.0  --   ? ?Anemia  Panel: ?Recent Labs  ?  08/16/21 ?0217  ?08/18/21 1,530*  ?FOLATE 26.6  ? ?CXR 3/27: No infiltrate. Interstitial prominence noted with chronic left CPA blunting.  ?CXR 3/28: Interstitial edema increased. ? ?Family Communication: None at bedside ? ?Disposition: ?Status is: Inpatient ?Remains inpatient appropriate because: Hypoxic respiratory failure, need for GI bleeding evaluation for symptomatic, recurrent anemia. ?Planned Discharge Destination:  TBD ? ?4/28, MD ?08/17/2021 10:52 AM ?Page by [email or web address]  ?

## 2021-08-17 NOTE — Progress Notes (Signed)
Patient's breathing has improved after dose of lasix.  Patient still has wheezing, but patient was able to move more freely in bed and actually lay on side to rest at approximately 0400.  Patient does have on purewick for I&O, but due to patient's h/o mental impairment, she continues to pull purewick  or change its position.  ?

## 2021-08-18 ENCOUNTER — Inpatient Hospital Stay (HOSPITAL_COMMUNITY): Payer: Medicare Other | Admitting: Anesthesiology

## 2021-08-18 ENCOUNTER — Encounter (HOSPITAL_COMMUNITY): Payer: Self-pay | Admitting: Internal Medicine

## 2021-08-18 ENCOUNTER — Encounter (HOSPITAL_COMMUNITY)
Admission: EM | Disposition: A | Discharge status: Home Health | Payer: Self-pay | Source: Skilled Nursing Facility | Attending: Family Medicine

## 2021-08-18 DIAGNOSIS — E039 Hypothyroidism, unspecified: Secondary | ICD-10-CM

## 2021-08-18 DIAGNOSIS — I509 Heart failure, unspecified: Secondary | ICD-10-CM

## 2021-08-18 DIAGNOSIS — D638 Anemia in other chronic diseases classified elsewhere: Secondary | ICD-10-CM

## 2021-08-18 DIAGNOSIS — J439 Emphysema, unspecified: Secondary | ICD-10-CM | POA: Diagnosis not present

## 2021-08-18 DIAGNOSIS — D649 Anemia, unspecified: Secondary | ICD-10-CM | POA: Diagnosis not present

## 2021-08-18 DIAGNOSIS — I2510 Atherosclerotic heart disease of native coronary artery without angina pectoris: Secondary | ICD-10-CM

## 2021-08-18 DIAGNOSIS — N179 Acute kidney failure, unspecified: Secondary | ICD-10-CM | POA: Diagnosis not present

## 2021-08-18 DIAGNOSIS — R7989 Other specified abnormal findings of blood chemistry: Secondary | ICD-10-CM | POA: Diagnosis not present

## 2021-08-18 DIAGNOSIS — I110 Hypertensive heart disease with heart failure: Secondary | ICD-10-CM

## 2021-08-18 HISTORY — PX: ESOPHAGOGASTRODUODENOSCOPY (EGD) WITH PROPOFOL: SHX5813

## 2021-08-18 LAB — CBC
HCT: 25.5 % — ABNORMAL LOW (ref 36.0–46.0)
Hemoglobin: 8.1 g/dL — ABNORMAL LOW (ref 12.0–15.0)
MCH: 32 pg (ref 26.0–34.0)
MCHC: 31.8 g/dL (ref 30.0–36.0)
MCV: 100.8 fL — ABNORMAL HIGH (ref 80.0–100.0)
Platelets: 637 10*3/uL — ABNORMAL HIGH (ref 150–400)
RBC: 2.53 MIL/uL — ABNORMAL LOW (ref 3.87–5.11)
RDW: 20.6 % — ABNORMAL HIGH (ref 11.5–15.5)
WBC: 4 10*3/uL (ref 4.0–10.5)
nRBC: 0 % (ref 0.0–0.2)

## 2021-08-18 LAB — BASIC METABOLIC PANEL
Anion gap: 5 (ref 5–15)
BUN: 26 mg/dL — ABNORMAL HIGH (ref 8–23)
CO2: 28 mmol/L (ref 22–32)
Calcium: 8.7 mg/dL — ABNORMAL LOW (ref 8.9–10.3)
Chloride: 106 mmol/L (ref 98–111)
Creatinine, Ser: 0.64 mg/dL (ref 0.44–1.00)
GFR, Estimated: >60 mL/min (ref >60)
Glucose, Bld: 104 mg/dL — ABNORMAL HIGH (ref 70–99)
Potassium: 4.2 mmol/L (ref 3.5–5.1)
Sodium: 139 mmol/L (ref 135–145)

## 2021-08-18 SURGERY — ESOPHAGOGASTRODUODENOSCOPY (EGD) WITH PROPOFOL
Anesthesia: General

## 2021-08-18 MED ORDER — LIDOCAINE HCL (CARDIAC) PF 100 MG/5ML IV SOSY
PREFILLED_SYRINGE | INTRAVENOUS | Status: DC | PRN
  Administered 2021-08-18: 50 mg via INTRAVENOUS

## 2021-08-18 MED ORDER — LACTATED RINGERS IV SOLN: INTRAVENOUS | Status: DC

## 2021-08-18 MED ORDER — PHENYLEPHRINE 40 MCG/ML (10ML) SYRINGE FOR IV PUSH (FOR BLOOD PRESSURE SUPPORT)
PREFILLED_SYRINGE | INTRAVENOUS | Status: AC
  Filled 2021-08-18: qty 20

## 2021-08-18 MED ORDER — PROPOFOL 10 MG/ML IV BOLUS
INTRAVENOUS | Status: DC | PRN
  Administered 2021-08-18: 20 mg via INTRAVENOUS
  Administered 2021-08-18: 80 mg via INTRAVENOUS

## 2021-08-18 MED ORDER — GLYCOPYRROLATE PF 0.2 MG/ML IJ SOSY
PREFILLED_SYRINGE | INTRAMUSCULAR | Status: AC
  Filled 2021-08-18: qty 1

## 2021-08-18 MED ORDER — GLYCOPYRROLATE PF 0.2 MG/ML IJ SOSY
PREFILLED_SYRINGE | INTRAMUSCULAR | Status: DC | PRN
  Administered 2021-08-18: .1 mg via INTRAVENOUS

## 2021-08-18 MED ORDER — EPHEDRINE 5 MG/ML INJ
INTRAVENOUS | Status: AC
  Filled 2021-08-18: qty 5

## 2021-08-18 MED ORDER — PHENYLEPHRINE 40 MCG/ML (10ML) SYRINGE FOR IV PUSH (FOR BLOOD PRESSURE SUPPORT)
PREFILLED_SYRINGE | INTRAVENOUS | Status: DC | PRN
  Administered 2021-08-18: 80 ug via INTRAVENOUS
  Administered 2021-08-18: 40 ug via INTRAVENOUS
  Administered 2021-08-18: 80 ug via INTRAVENOUS

## 2021-08-18 MED ORDER — LACTATED RINGERS IV SOLN
INTRAVENOUS | Status: DC | PRN
Start: 2021-08-18 — End: 2021-08-18

## 2021-08-18 NOTE — Progress Notes (Signed)
Pt noted to brady into 40s and go back to normal in 80s HR. Dr. Briant Cedar aware, pt stable at this time.  ?

## 2021-08-18 NOTE — Plan of Care (Signed)
?  Problem: Acute Rehab PT Goals(only PT should resolve) ?Goal: Pt Will Go Supine/Side To Sit ?Outcome: Progressing ?Flowsheets (Taken 08/18/2021 1557) ?Pt will go Supine/Side to Sit: with modified independence ?Goal: Patient Will Transfer Sit To/From Stand ?Outcome: Progressing ?Flowsheets (Taken 08/18/2021 1557) ?Patient will transfer sit to/from stand: ? with supervision ? with min guard assist ?Goal: Pt Will Transfer Bed To Chair/Chair To Bed ?Outcome: Progressing ?Flowsheets (Taken 08/18/2021 1557) ?Pt will Transfer Bed to Chair/Chair to Bed: ? with supervision ? min guard assist ?Goal: Pt Will Ambulate ?Outcome: Progressing ?Flowsheets (Taken 08/18/2021 1557) ?Pt will Ambulate: ? 100 feet ? with supervision ? with min guard assist ? with rolling walker ?  ?3:57 PM, 08/18/21 ?Ocie Bob, MPT ?Physical Therapist with [place] ?[facility] ?[phone] office ?[phone] mobile phone ? ?

## 2021-08-18 NOTE — Anesthesia Preprocedure Evaluation (Signed)
Anesthesia Evaluation  ?Patient identified by MRN, date of birth, ID band ? ?Reviewed: ?Allergy & Precautions, H&P , NPO status , Patient's Chart, lab work & pertinent test results, reviewed documented beta blocker date and time , Unable to perform ROS - Chart review only ? ?Airway ?Mallampati: II ? ?TM Distance: >3 FB ?Neck ROM: full ? ? ? Dental ?no notable dental hx. ? ?  ?Pulmonary ?asthma , COPD, former smoker,  ?  ?Pulmonary exam normal ?breath sounds clear to auscultation ? ? ? ? ? ? Cardiovascular ?Exercise Tolerance: Good ?hypertension, + CAD and +CHF  ? ?Rhythm:regular Rate:Normal ? ? ?  ?Neuro/Psych ?Seizures -,  PSYCHIATRIC DISORDERS Dementia   ? GI/Hepatic ?Neg liver ROS, GERD  Medicated,  ?Endo/Other  ?Hypothyroidism  ? Renal/GU ?negative Renal ROS  ?negative genitourinary ?  ?Musculoskeletal ? ? Abdominal ?  ?Peds ? Hematology ? ?(+) Blood dyscrasia, anemia ,   ?Anesthesia Other Findings ? ? Reproductive/Obstetrics ?negative OB ROS ? ?  ? ? ? ? ? ? ? ? ? ? ? ? ? ?  ?  ? ? ? ? ? ? ? ? ?Anesthesia Physical ?Anesthesia Plan ? ?ASA: 3 ? ?Anesthesia Plan: General  ? ?Post-op Pain Management:   ? ?Induction:  ? ?PONV Risk Score and Plan: Propofol infusion ? ?Airway Management Planned:  ? ?Additional Equipment:  ? ?Intra-op Plan:  ? ?Post-operative Plan:  ? ?Informed Consent: I have reviewed the patients History and Physical, chart, labs and discussed the procedure including the risks, benefits and alternatives for the proposed anesthesia with the patient or authorized representative who has indicated his/her understanding and acceptance.  ? ? ? ?Dental Advisory Given ? ?Plan Discussed with: CRNA ? ?Anesthesia Plan Comments:   ? ? ? ? ? ? ?Anesthesia Quick Evaluation ? ?

## 2021-08-18 NOTE — Anesthesia Procedure Notes (Signed)
Date/Time: 08/18/2021 10:37 AM ?Performed by: Julian Reil, CRNA ?Pre-anesthesia Checklist: Patient identified, Emergency Drugs available, Suction available and Patient being monitored ?Patient Re-evaluated:Patient Re-evaluated prior to induction ?Oxygen Delivery Method: Nasal cannula ?Induction Type: IV induction ?Placement Confirmation: positive ETCO2 ? ? ? ? ?

## 2021-08-18 NOTE — Evaluation (Signed)
Physical Therapy Evaluation ?Patient Details ?Name: Deborah [name] ?MRN: [id number] ?DOB: February 09, 1926 ?Today's Date: 08/18/2021 ? ?History of Present Illness ? Deborah [name] is a 88 y.o. female with medical history significant for Alzheimer's dementia, coronary artery disease, permanent atrial fibrillation, diastolic CHF, hypertension, hyperlipidemia, COPD and seizure order who presents to the emergency department via EMS due to shortness of breath.  Patient was unable to provide details regarding the history, history was obtained from ED physician and ED medical record.  Per report, nursing staff at [facility] activated EMS due to concern for shortness of breath.  Patient was admitted from 2/03/24/2014 due to acute respiratory failure with hypoxia and symptomatic anemia during which patient presented with hemoglobin of 5.8 and 2 units of PRBC was ordered and transfused, GI was consulted and an outpatient EGD/colonoscopy was planned at that time, patient's hemoglobin improved to 8-9 range prior to discharge. ?  ?Clinical Impression ? Patient demonstrates slightly labored movement for sitting up at bedside with [facility] raised, fair/good return for completing sit to stands, transfers and ambulating in hallway with slow labored cadence without loss of balance and limited mostly due to c/o fatigue.  Patient tolerated sitting up in chair after therapy - nursing staff aware.  Patient will benefit from continued skilled physical therapy in hospital and recommended venue below to increase strength, balance, endurance for safe ADLs and gait.  ?   ?   ? ?Recommendations for follow up therapy are one component of a multi-disciplinary discharge planning process, led by the attending physician.  Recommendations may be updated based on patient status, additional functional criteria and insurance authorization. ? ?Follow Up Recommendations Home health PT ? ?  ?Assistance Recommended at Discharge Intermittent Supervision/Assistance   ?Patient can return home with the following ? A little help with walking and/or transfers;A little help with bathing/dressing/bathroom;Help with stairs or ramp for entrance;Assist for transportation;Assistance with cooking/housework ? ?  ?Equipment Recommendations None recommended by PT  ?Recommendations for Other Services ?    ?  ?Functional Status Assessment Patient has had a recent decline in their functional status and demonstrates the ability to make significant improvements in function in a reasonable and predictable amount of time.  ? ?  ?Precautions / Restrictions Precautions ?Precautions: Fall ?Restrictions ?Weight Bearing Restrictions: No  ? ?  ? ?Mobility ? Bed Mobility ?Overal bed mobility: Needs Assistance ?Bed Mobility: Supine to Sit ?  ?  ?Supine to sit: Supervision, HOB elevated ?  ?  ?General bed mobility comments: slightly labored movement with increased time ?  ? ?Transfers ?Overall transfer level: Needs assistance ?Equipment used: Rolling walker (2 wheels) ?Transfers: Sit to/from Stand, Bed to chair/wheelchair/BSC ?Sit to Stand: Min guard ?  ?Step pivot transfers: Min guard ?  ?  ?  ?  ?  ? ?Ambulation/Gait ?Ambulation/Gait assistance: Min guard, Min assist ?Gait Distance (Feet): 75 Feet ?Assistive device: Rolling walker (2 wheels) ?Gait Pattern/deviations: Decreased step length - right, Decreased step length - left, Decreased stride length ?Gait velocity: decreased ?  ?  ?General Gait Details: slow slightly labored cadence without loss of balance, limited mostly due to fatigue ? ?Stairs ?  ?  ?  ?  ?  ? ?Wheelchair Mobility ?  ? ?Modified Rankin (Stroke Patients Only) ?  ? ?  ? ?Balance Overall balance assessment: Needs assistance ?Sitting-balance support: Feet supported, No upper extremity supported ?Sitting balance-Leahy Scale: Good ?Sitting balance - Comments: seated at EOB ?  ?Standing balance support: During functional activity, Bilateral  upper extremity supported ?Standing balance-Leahy  Scale: Fair ?Standing balance comment: using RW ?  ?  ?  ?  ?  ?  ?  ?  ?  ?  ?  ?   ? ? ? ?Pertinent Vitals/Pain Pain Assessment ?Pain Assessment: No/denies pain  ? ? ?Home Living Family/patient expects to be discharged to:: Assisted living ?  ?  ?  ?  ?  ?  ?  ?  ?Home Equipment: Agricultural consultant (2 wheels);Cane - single point ?   ?  ?Prior Function Prior Level of Function : Needs assist ?  ?  ?  ?Physical Assist : Mobility (physical);ADLs (physical) ?Mobility (physical): Bed mobility;Transfers;Gait;Stairs ?  ?Mobility Comments: household ambulator using RW ?ADLs Comments: assisted by ALF staff ?  ? ? ?Hand Dominance  ? Dominant Hand: Right ? ?  ?Extremity/Trunk Assessment  ? Upper Extremity Assessment ?Upper Extremity Assessment: Generalized weakness ?  ? ?Lower Extremity Assessment ?Lower Extremity Assessment: Generalized weakness ?  ? ?Cervical / Trunk Assessment ?Cervical / Trunk Assessment: Kyphotic  ?Communication  ? Communication: No difficulties  ?Cognition Arousal/Alertness: Awake/alert ?Behavior During Therapy: [facility] for tasks assessed/performed ?Overall Cognitive Status: History of cognitive impairments - at baseline ?  ?  ?  ?  ?  ?  ?  ?  ?  ?  ?  ?  ?  ?  ?  ?  ?  ?  ?  ? ?  ?General Comments   ? ?  ?Exercises    ? ?Assessment/Plan  ?  ?PT Assessment Patient needs continued PT services  ?PT Problem List Decreased strength;Decreased activity tolerance;Decreased balance;Decreased mobility ? ?   ?  ?PT Treatment Interventions DME instruction;Gait training;Stair training;Functional mobility training;Therapeutic activities;Therapeutic exercise;Patient/family education;Balance training   ? ?PT Goals (Current goals can be found in the Care Plan section)  ?Acute Rehab PT Goals ?Patient Stated Goal: return home with ALF staff to assist ?PT Goal Formulation: With patient ?Time For Goal Achievement: 08/21/21 ?Potential to Achieve Goals: Good ? ?  ?Frequency Min 3X/week ?  ? ? ?Co-evaluation   ?  ?  ?  ?  ? ? ?   ?AM-PAC PT "6 Clicks" Mobility  ?Outcome Measure Help needed turning from your back to your side while in a flat bed without using bedrails?: None ?Help needed moving from lying on your back to sitting on the side of a flat bed without using bedrails?: A Little ?Help needed moving to and from a bed to a chair (including a wheelchair)?: A Little ?Help needed standing up from a chair using your arms (e.g., wheelchair or bedside chair)?: A Little ?Help needed to walk in hospital room?: A Little ?Help needed climbing 3-5 steps with a railing? : A Little ?6 Click Score: 19 ? ?  ?End of Session   ?Activity Tolerance: Patient tolerated treatment well;Patient limited by fatigue ?Patient left: in chair;with call bell/phone within reach ?Nurse Communication: Mobility status ?PT Visit Diagnosis: Unsteadiness on feet (R26.81);Other abnormalities of gait and mobility (R26.89);Muscle weakness (generalized) (M62.81) ?  ? ?Time: 5465-0354 ?PT Time Calculation (min) (ACUTE ONLY): 21 min ? ? ?Charges:   PT Evaluation ?$PT Eval Moderate Complexity: 1 Mod ?PT Treatments ?$Therapeutic Activity: 8-22 mins ?  ?   ? ? ?3:56 PM, 08/18/21 ?Ocie Bob, MPT ?Physical Therapist with [place] ?[facility] ?[phone] office ?[phone] mobile phone ? ? ?

## 2021-08-18 NOTE — Progress Notes (Signed)
?Progress Note ? ?Patient: Deborah [name] LZJ:[id number] DOB: 1925-12-01  ?DOA: 08/16/2021  DOS: 08/18/2021  ?  ?Brief hospital course: ?ARY RUDNICK is a 88 y.o. female with a history of Alzheimer's dementia, CAD, permanent AFib not on anticoagulation, HFpEF, HTN, HLD, COPD, and seizure disorder who was sent from [facility] on 3/26 to the ED with shortness of breath. She was found to be tachypneic with hypotension, with macrocytic anemia (hgb 6.1g/dl from prior in 8's), thrombocytosis, and AKI (SCr 1.21 from baseline of 0.6-0.8). CXR was nonacute. 1u PRBCs ordered and patient admitted for symptomatic recurrent anemia. GI recommended EGD which is delayed to 3/29 due to respiratory distress. IV lasix has been given with improvement in respiratory effort.  ? ?Assessment and Plan: ?Symptomatic macrocytic anemia: B12 and folic acid wnl/elevated. Recent iron panel w/elevated ferritin, iron, and 47% sat w/TIBC 384.  ?- s/p 1u PRBCs, recheck H/H shows improvement to 7.8g/dl. Stable on recheck. Repeat CBC in AM.  ?- Continue empiric PPI ?- GI recommending inpatient EGD. Done 3/29: Esophagus, stomach and duodenum were within normal limits.  No blood or mucosal lesions were found.   ?  ?Acute hypoxic respiratory failure: resolved now.  ?May be acute on chronic. LVEF normal without RWMA on echo last month.  ?- Continue supplemental oxygen if needed to maintain SpO2 >89%. With hx COPD, would not need to aim for higher than 95% unless in respiratory distress. She is on < 1L this AM, improved from respiratory distress emerging overnight with lasix. Continues to have crackles on exam. Will repeat lasix.  ?- Anxiety contributing to upper airway component, restart home xanax. ?- Continue controller COPD BDs, singulair, and prn albuterol. ?  ?Chronic HFpEF: With acute interstitial pulmonary edema ?- Treated with IV lasix  with good results.   ?- Continue to monitor I/O, volume status clinically. ? ?Intake/Output Summary (Last 24  hours) at 08/18/2021 1427 ?Last data filed at 08/18/2021 1046 ?Gross per 24 hour  ?Intake 1160 ml  ?Output 800 ml  ?Net 360 ml  ? ? ?AKI: Improving.  ?- Continue monitoring, avoiding nephrotoxins.   ? ?Lab Results  ?Component Value Date  ? CREATININE 0.64 08/18/2021  ? CREATININE 0.72 08/17/2021  ? CREATININE 0.95 08/16/2021  ? ?  ?Permanent atrial fibrillation: Initially with slow ventricular response in mid-40's on ECG, later up to rate in 80's.  ?- Continue digoxin. If BP improves, can restart home diltiazem. Continue home midodrine.   Pt having bradycardia and pauses, will check dig level, will continue to hold diltiazem.   ?  ?Seizure disorder:  ?- Continue keppra.  ?  ?Hypothyroidism:  ?- Continue synthroid ? ?Unstageable right heel pressure injury: Unknown whether was POA. Recommend offloading. ? ?Subjective: Pt remains pleasantly confused.  No specific complaints.  ? ?Objective: ?Vitals:  ? 08/18/21 1100 08/18/21 1115 08/18/21 1138 08/18/21 1147  ?BP: (!) 88/62 (!) 91/55 (!) 96/54   ?Pulse: (!) 52 82 (!) 45   ?Resp: 19 14 15 20   ?Temp:   98.1 ?F (36.7 ?C)   ?TempSrc:   Oral   ?SpO2: 95% 94% 98%   ?Weight:      ?Height:      ? ?Gen: Elderly female in no distress ?Pulm: Nonlabored tachypnea with upper airway sounds. Bibasilar crackles without lower wheezes.  ?CV: bradycardic rate, No new murmur, rub, or gallop. No JVD, trace dependent edema. ?GI: Abdomen soft, non-tender, non-distended, with normoactive bowel sounds.  ?Ext: Warm, no deformities ?Skin: No new rashes,  lesions or ulcers on visualized skin. ?Neuro: Alert and incompletely oriented without focal neurological deficits. ?Psych: Calm. ? ?Data Personally reviewed: ?CBC: ?Recent Labs  ?Lab 08/16/21 ?6213 08/16/21 ?0865 08/17/21 ?0700 08/18/21 ?0617  ?WBC 5.1 6.2 6.5 4.0  ?NEUTROABS 4.2  --   --   --   ?HGB 6.1* 7.8* 7.8* 8.1*  ?HCT 19.2* 24.2* 24.2* 25.5*  ?MCV 104.9* 99.2 100.4* 100.8*  ?PLT 568* 564* 504* 637*  ? ?Basic Metabolic Panel: ?Recent Labs   ?Lab 08/16/21 ?7846 08/16/21 ?9629 08/17/21 ?0700 08/18/21 ?0617  ?NA 140 139 139 139  ?K 4.3 3.9 4.0 4.2  ?CL 105 106 104 106  ?CO2 28 26 27 28   ?GLUCOSE 117* 146* 100* 104*  ?BUN 31* 27* 22 26*  ?CREATININE 1.11* 0.95 0.72 0.64  ?CALCIUM 9.0 8.5* 8.8* 8.7*  ?MG  --  2.2  --   --   ?PHOS  --  4.0  --   --   ? ?Anemia Panel: ?Recent Labs  ?  08/16/21 ?0217  ?08/18/21 1,530*  ?FOLATE 26.6  ? ?CXR 3/27: No infiltrate. Interstitial prominence noted with chronic left CPA blunting.  ?CXR 3/28: Interstitial edema increased. ? ?Family Communication: None at bedside ? ?Disposition: ?Status is: Inpatient ?Remains inpatient appropriate because: Hypoxic respiratory failure, need for GI bleeding evaluation for symptomatic, recurrent anemia. ?Planned Discharge Destination:  TBD ? ?4/28, MD ?08/18/2021 2:25 PM ?Page by [email or web address]  ?

## 2021-08-18 NOTE — Brief Op Note (Signed)
08/16/2021 - 08/18/2021 ? ?10:49 AM ? ?PATIENT:  Deborah [name]  88 y.o. female ? ?PRE-OPERATIVE DIAGNOSIS:  Anemia ? ?POST-OPERATIVE DIAGNOSIS:  normal exam ? ?PROCEDURE:  Procedure(s): ?ESOPHAGOGASTRODUODENOSCOPY (EGD) WITH PROPOFOL (N/A) ? ?SURGEON:  Surgeon(s) and Role: ?   Harvel Quale, MD - Primary ? ?Patient underwent EGD under propofol sedation.  Tolerated the procedure adequately.  Esophagus, stomach and duodenum were within normal limits.  No blood or mucosal lesions were found. ? ?RECOMMENDATIONS ?- Return patient to hospital ward for ongoing care.  ?- Resume previous diet.  ?- Would recommend follow up with hematology as outpatient. ?- Follow up with scheduled appointment in GI clinic on 09/16/2021. ?- GI service will sign-off, please call us back if you have any more questions. ? ?Maylon Peppers, MD ?Gastroenterology and Hepatology ?[facility] ? ?

## 2021-08-18 NOTE — Transfer of Care (Signed)
Immediate Anesthesia Transfer of Care Note ? ?Patient: Deborah [name] ? ?Procedure(s) Performed: ESOPHAGOGASTRODUODENOSCOPY (EGD) WITH PROPOFOL ? ?Patient Location: PACU ? ?Anesthesia Type:General ? ?Level of Consciousness: awake ? ?Airway & Oxygen Therapy: Patient Spontanous Breathing and Patient connected to nasal cannula oxygen ? ?Post-op Assessment: Report given to RN and Post -op Vital signs reviewed and stable ? ?Post vital signs: Reviewed and stable ? ?Last Vitals:  ?Vitals Value Taken Time  ?BP 69/43 08/18/21 1045  ?Temp    ?Pulse 59 08/18/21 1045  ?Resp 18 08/18/21 1045  ?SpO2 97 % 08/18/21 1045  ?Vitals shown include unvalidated device data. ? ?Last Pain:  ?Vitals:  ? 08/18/21 1006  ?TempSrc: Oral  ?PainSc:   ?   ? ?  ? ?Complications: No notable events documented. ?

## 2021-08-18 NOTE — Progress Notes (Signed)
We will proceed with EGD as scheduled.  I thoroughly discussed with the patient his procedure, including the risks involved. Patient understands what the procedure involves including the benefits and any risks. Patient understands alternatives to the proposed procedure. Risks including (but not limited to) bleeding, tearing of the lining (perforation), rupture of adjacent organs, problems with heart and lung function, infection, and medication reactions. A small percentage of complications may require surgery, hospitalization, repeat endoscopic procedure, and/or transfusion.  Patient understood and agreed.  Roderick Sweezy Castaneda, MD Gastroenterology and Hepatology [facility]  

## 2021-08-18 NOTE — Op Note (Signed)
[facility] ?Patient Name: Deborah [name] ?Procedure Date: 08/18/2021 10:01 AM ?MRN: [id number] ?Date of Birth: August 08, 1925 ?Attending MD: Maylon Peppers ,  ?CSN: [id number] ?Age: 88 ?Admit Type: Inpatient ?Procedure:                Upper GI endoscopy ?Indications:              Anemia ?Providers:                Maylon Peppers, Caprice Kluver, Randa Spike,  ?                          Technician ?Referring MD:              ?Medicines:                Monitored Anesthesia Care ?Complications:            No immediate complications. ?Estimated Blood Loss:     Estimated blood loss: none. ?Procedure:                Pre-Anesthesia Assessment: ?                          - Prior to the procedure, a History and Physical  ?                          was performed, and patient medications, allergies  ?                          and sensitivities were reviewed. The patient's  ?                          tolerance of previous anesthesia was reviewed. ?                          - The risks and benefits of the procedure and the  ?                          sedation options and risks were discussed with the  ?                          patient. All questions were answered and informed  ?                          consent was obtained. ?                          - ASA Grade Assessment: III - A patient with severe  ?                          systemic disease. ?                          After obtaining informed consent, the endoscope was  ?                          passed under direct vision. Throughout the  ?  procedure, the patient's blood pressure, pulse, and  ?                          oxygen saturations were monitored continuously. The  ?                          [id number]) scope was introduced through the  ?                          mouth, and advanced to the second part of duodenum.  ?                          The upper GI endoscopy was accomplished without  ?                          difficulty. The  patient tolerated the procedure  ?                          well. ?Scope In: 10:33:15 AM ?Scope Out: 10:38:05 AM ?Total Procedure Duration: 0 hours 4 minutes 50 seconds  ?Findings: ?     The examined esophagus was normal. ?     The entire examined stomach was normal. Upon careful inspection, no  ?     presence of hematin or active bleeding was present. ?     The examined duodenum was normal. Upon careful inspection, no presence  ?     of hematin or active bleeding was present. ?     Findings communicated to daughter, Susa Raring. ?Impression:               - Normal esophagus. ?                          - Normal stomach. ?                          - Normal examined duodenum. ?                          - No specimens collected. ?Moderate Sedation: ?     Per Anesthesia Care ?Recommendation:           - Return patient to hospital ward for ongoing care. ?                          - Resume previous diet. ?                          - Would recommend follow up with hematology as  ?                          outpatient. ?                          - Follow up with scheduled appointment in GI clinic  ?                          on 09/16/2021 - may be difficult  to proceed with  ?                          colonoscopy due to dementia as discussed with  ?                          daughter. ?Procedure Code(s):        --- Professional --- ?                          [phone], Esophagogastroduodenoscopy, flexible,  ?                          transoral; diagnostic, including collection of  ?                          specimen(s) by brushing or washing, when performed  ?                          (separate procedure) ?Diagnosis Code(s):        --- Professional --- ?                          D64.9, Anemia, unspecified ?CPT copyright 2019 [employer]. All rights reserved. ?The codes documented in this report are preliminary and upon coder review may  ?be revised to meet current compliance requirements. ?Maylon Peppers,  MD ?Maylon Peppers,  ?08/18/2021 10:56:38 AM ?This report has been signed electronically. ?Number of Addenda: 0 ?

## 2021-08-18 NOTE — Progress Notes (Signed)
Patient breathing has been stable this shift.  Patient states she " feels okay" when asked about her breathing. Patient has had bowel movement last night and has rested this shift.  Patient for GI procedure later today.  ?

## 2021-08-18 NOTE — Progress Notes (Signed)
Tele called patient had a 3.99 pause at 1202. Md Laural Benes made aware.  ?

## 2021-08-19 DIAGNOSIS — N179 Acute kidney failure, unspecified: Secondary | ICD-10-CM | POA: Diagnosis not present

## 2021-08-19 DIAGNOSIS — D649 Anemia, unspecified: Secondary | ICD-10-CM | POA: Diagnosis not present

## 2021-08-19 DIAGNOSIS — J439 Emphysema, unspecified: Secondary | ICD-10-CM | POA: Diagnosis not present

## 2021-08-19 DIAGNOSIS — K219 Gastro-esophageal reflux disease without esophagitis: Secondary | ICD-10-CM | POA: Diagnosis not present

## 2021-08-19 LAB — CBC
HCT: 27.5 % — ABNORMAL LOW (ref 36.0–46.0)
Hemoglobin: 8.8 g/dL — ABNORMAL LOW (ref 12.0–15.0)
MCH: 32.1 pg (ref 26.0–34.0)
MCHC: 32 g/dL (ref 30.0–36.0)
MCV: 100.4 fL — ABNORMAL HIGH (ref 80.0–100.0)
Platelets: 663 10*3/uL — ABNORMAL HIGH (ref 150–400)
RBC: 2.74 MIL/uL — ABNORMAL LOW (ref 3.87–5.11)
RDW: 19.9 % — ABNORMAL HIGH (ref 11.5–15.5)
WBC: 3.7 10*3/uL — ABNORMAL LOW (ref 4.0–10.5)
nRBC: 0 % (ref 0.0–0.2)

## 2021-08-19 LAB — DIGOXIN LEVEL: Digoxin Level: 0.5 ng/mL — ABNORMAL LOW (ref 0.8–2.0)

## 2021-08-19 MED ORDER — PREDNISONE 20 MG PO TABS: 20.0000 mg | ORAL_TABLET | Freq: Every day | ORAL | 0 refills | Status: AC

## 2021-08-19 MED ORDER — ALPRAZOLAM 0.25 MG PO TABS: 0.2500 mg | ORAL_TABLET | Freq: Three times a day (TID) | ORAL | 0 refills | Status: DC | PRN

## 2021-08-19 NOTE — [place] FL2 (Signed)
?[place] MEDICAID FL2 LEVEL OF CARE SCREENING TOOL  ?  ? ?IDENTIFICATION  ?Patient Name: ?Deborah [name] Birthdate: [birth year] Sex: female Admission Date (Current Location): ?08/16/2021  ?[place] Number: ? [place] ?  Facility and Address:  ?[facility],  [address], [place] [address] ?     Provider Number: ?[id number]  ?Attending Physician Name and Address:  ?Cleora Fleet, MD ? Relative Name and Phone Number:  ?Lonn Georgia (Daughter)   [phone] ?   ?Current Level of Care: ?Hospital Recommended Level of Care: ?Assisted Living Facility, Memory Care Prior Approval Number: ?  ? ?Date Approved/Denied: ?  PASRR Number: ?  ? ?Discharge Plan: ?Domiciliary (Rest home) ?  ? ?Current Diagnoses: ?Patient Active Problem List  ? Diagnosis Date Noted  ? Iron deficiency anemia due to chronic blood loss   ? SOB (shortness of breath)   ? Elevated brain natriuretic peptide (BNP) level 07/04/2021  ? Elevated MCV 07/04/2021  ? Thrombocytosis 07/04/2021  ? Acute respiratory failure with hypoxia (HCC) 07/04/2021  ? Hypothyroidism 07/04/2021  ? GERD (gastroesophageal reflux disease) 07/04/2021  ? Major neurocognitive disorder (HCC) 07/04/2021  ? Symptomatic anemia 07/04/2021  ? Acute metabolic encephalopathy 09/01/2020  ? Generalized weakness 09/01/2020  ? Hypercalcemia 09/01/2020  ? Dehydration 09/01/2020  ? Essential hypertension 09/01/2020  ? Acute pulmonary embolism without acute cor pulmonale (HCC) 07/20/2019  ? Sepsis due to undetermined organism (HCC) 07/18/2019  ? AKI (acute kidney injury) (HCC) 07/18/2019  ? Acute cystitis without hematuria   ? Sepsis (HCC) 07/17/2019  ? COVID-19 virus infection 07/17/2019  ? Chest pain 09/02/2017  ? Compression fracture of body of thoracic vertebra (HCC) 06/22/2017  ? Atrial fibrillation with rapid ventricular response (HCC)   ? Screening for colorectal cancer 03/24/2017  ? Hemorrhoids 03/24/2017  ? Itching in the vaginal area 03/24/2017  ? Vulval  thrush 03/24/2017  ? Hypoxemia   ? Alzheimer disease (HCC) 09/13/2016  ? Seizures (HCC) 09/13/2016  ? Hypokalemia 09/13/2016  ? COPD exacerbation (HCC) 09/13/2016  ? Hypotension 04/22/2012  ? CAD S/P percutaneous coronary angioplasty   ? COPD (chronic obstructive pulmonary disease) (HCC)   ? Permanent atrial fibrillation (HCC) 02/10/2009  ? Acute on chronic diastolic CHF (congestive heart failure) (HCC) 02/10/2009  ? ? ?Orientation RESPIRATION BLADDER Height & Weight   ?  ?Self ? Normal Incontinent Weight: 143 lb 4.8 oz (65 kg) ?Height:  5\' 8"  (172.7 cm)  ?BEHAVIORAL SYMPTOMS/MOOD NEUROLOGICAL BOWEL NUTRITION STATUS  ?    Incontinent Diet  ?AMBULATORY STATUS COMMUNICATION OF NEEDS Skin   ?Limited Assist Verbally PU Stage and Appropriate Care (07/06/21: unstagable,right heel) ?  ?  ?  ?    ?     ?     ? ? ?Personal Care Assistance Level of Assistance  ?Bathing, Feeding, Dressing Bathing Assistance: Limited assistance ?Feeding assistance: Limited assistance ?Dressing Assistance: Limited assistance ?   ? ?Functional Limitations Info  ?    ?  ?   ? ? ?SPECIAL CARE FACTORS FREQUENCY  ?    ?  ?  ?  ?  ?  ?  ?   ? ? ?Contractures    ? ? ?Additional Factors Info  ?Code Status, Allergies, Psychotropic Code Status Info: DNR ?Allergies Info: ivp dye, omnipaque, sulfa antibiotics, amiodarone, carbamazepine, dilaudid, tramadol ?Psychotropic Info: Zoloft, Xanax ?  ?  ?   ? ?Current Medications (08/19/2021):  This is the current hospital active medication list ?Current Facility-Administered Medications  ?Medication Dose  Route Frequency Provider Last Rate Last Admin  ? acetaminophen (TYLENOL) tablet 650 mg  650 mg Oral Q6H PRN Adefeso, Oladapo, DO   650 mg at 08/19/21 0538  ? albuterol (PROVENTIL) (2.5 MG/3ML) 0.083% nebulizer solution 2.5 mg  2.5 mg Nebulization Q4H PRN Adefeso, Oladapo, DO   2.5 mg at 08/17/21 1027  ? ALPRAZolam [name](XANAX) tablet 0.25 mg  0.25 mg Oral Q8H PRN Tyrone NineGrunz, Ryan B, MD   0.25 mg at 08/19/21 0134  ? digoxin  (LANOXIN) tablet 0.125 mg  0.125 mg Oral QPM Adefeso, Oladapo, DO   0.125 mg at 08/18/21 1748  ? fluticasone furoate-vilanterol (BREO ELLIPTA) 100-25 MCG/ACT 1 puff  1 puff Inhalation Daily Adefeso, Oladapo, DO   1 puff at 08/19/21 0918  ? And  ? umeclidinium bromide (INCRUSE ELLIPTA) 62.5 MCG/ACT 1 puff  1 puff Inhalation Daily Adefeso, Oladapo, DO   1 puff at 08/16/21 0804  ? ipratropium (ATROVENT) nebulizer solution 0.5 mg  0.5 mg Nebulization Q6H PRN Adefeso, Oladapo, DO      ? levETIRAcetam (KEPPRA) tablet 125 mg  125 mg Oral Q breakfast Tyrone NineGrunz, Ryan B, MD   125 mg at 08/19/21 1037  ? levETIRAcetam (KEPPRA) tablet 250 mg  250 mg Oral Q supper Tyrone NineGrunz, Ryan B, MD   250 mg at 08/18/21 1741  ? levothyroxine (SYNTHROID) tablet 25 mcg  25 mcg Oral QAC breakfast Adefeso, Oladapo, DO   25 mcg at 08/19/21 0534  ? methylPREDNISolone sodium succinate (SOLU-MEDROL) 40 mg/mL injection 40 mg  40 mg Intravenous Q12H Adefeso, Oladapo, DO   40 mg at 08/19/21 0534  ? midodrine (PROAMATINE) tablet 2.5 mg  2.5 mg Oral BID WC Hazeline JunkerGrunz, Ryan B, MD   2.5 mg at 08/19/21 1036  ? montelukast (SINGULAIR) tablet 10 mg  10 mg Oral Daily Adefeso, Oladapo, DO   10 mg at 08/19/21 1036  ? pantoprazole (PROTONIX) injection 40 mg  40 mg Intravenous Q12H Ermalinda MemosHarper, Kristen S, PA-C   40 mg at 08/18/21 2107  ? sodium chloride (OCEAN) 0.65 % nasal spray 1 spray  1 spray Each Nare PRN Tyrone NineGrunz, Ryan B, MD   1 spray at 08/17/21 0058  ? ? ? ?Discharge Medications: ?TAKE these medications   ?  ?albuterol (2.5 MG/3ML) 0.083% nebulizer solution ?Commonly known as: PROVENTIL ?Take 3 mLs (2.5 mg total) by nebulization every 4 (four) hours as needed for wheezing or shortness of breath. ?   ?ALPRAZolam 0.25 MG tablet ?Commonly known as: Prudy FeelerXANAX ?Take 1 tablet (0.25 mg total) by mouth every 8 (eight) hours as needed for anxiety. ?   ?D-5000 125 MCG (5000 UT) Tabs ?Generic drug: Cholecalciferol ?Take 5,000 Units by mouth daily. ?   ?digoxin 0.125 MG tablet ?Commonly known  as: LANOXIN ?Take 1 tablet (0.125 mg total) by mouth every evening. ?   ?docusate sodium 100 MG capsule ?Commonly known as: COLACE ?Take 100 mg by mouth at bedtime. ?   ?famotidine 20 MG tablet ?Commonly known as: PEPCID ?Take 1 tablet (20 mg total) by mouth at bedtime. ?   ?fluticasone 50 MCG/ACT nasal spray ?Commonly known as: FLONASE ?Place 2 sprays into both nostrils daily. ?   ?ICaps MV Tabs ?Take 1 tablet by mouth in the morning and at bedtime. ?   ?levETIRAcetam 250 MG tablet ?Commonly known as: KEPPRA ?Take 125-250 mg by mouth See admin instructions. One tablet at 0900 and half a tablet at 1700 ?   ?levothyroxine 25 MCG tablet ?Commonly known as: SYNTHROID ?Take 25  mcg by mouth daily before breakfast. ?   ?melatonin 5 MG Tabs ?Take 5 mg by mouth at bedtime. ?   ?midodrine 2.5 MG tablet ?Commonly known as: PROAMATINE ?Take 1 tablet (2.5 mg total) by mouth 2 (two) times daily with a meal. The dosing can be titrated down to once daily when her systolic blood pressure is consistently in the 100s. ?   ?montelukast 10 MG tablet ?Commonly known as: SINGULAIR ?Take 10 mg by mouth daily. ?   ?pantoprazole 40 MG tablet ?Commonly known as: PROTONIX ?Take 1 tablet (40 mg total) by mouth daily. ?   ?predniSONE 20 MG tablet ?Commonly known as: DELTASONE ?Take 1 tablet (20 mg total) by mouth daily with breakfast for 5 days. ?Start taking on: August 20, 2021 ?What changed:  ?medication strength ?how much to take ?how to take this ?when to take this ?additional instructions ?   ?sertraline 25 MG tablet ?Commonly known as: ZOLOFT ?Take 25 mg by mouth daily. ?   ?Trelegy Ellipta 100-62.5-25 MCG/ACT Aepb ?Generic drug: Fluticasone-Umeclidin-Vilant ?Inhale 1 puff into the lungs daily. ?   ?  ? ?Relevant Imaging Results: ? ?Relevant Lab Results: ? ? ?Additional Information ?  ? ?Idabell Picking D, LCSW ? ? ? ? ?

## 2021-08-19 NOTE — Care Management Important Message (Signed)
Important Message ? ?Patient Details  ?Name: Deborah [name] ?MRN: [id number] ?Date of Birth: [birth year] ? ? ?Medicare Important Message Given:  Yes ? ? ? ? ?[name] ?08/19/2021, 10:21 AM ?

## 2021-08-19 NOTE — Progress Notes (Signed)
Nsg Discharge Note ? ?Admit Date:  08/16/2021 ?Discharge date: 08/19/2021 ?  ?Deborah [name] to be D/C'd Nursing Home per MD order.  AVS completed.  Copy for chart, and copy for patient signed, and dated. ?Removed IV-CDI. Reviewed d/c paperwork with patient and daughter. Answered all questions. [name] wheeled stable patient and belongings to main entrance where she was picked up by her daughter to transport to [place]. ?Patient/caregiver able to verbalize understanding. ? ?Discharge Medication: ?Allergies as of 08/19/2021   ? ?   Reactions  ? Ivp Dye [iodinated Contrast Media] Shortness Of Breath  ? ??  ? Omnipaque [iohexol] Shortness Of Breath, Other (See Comments)  ? Short of breath with chest tightness after IV injection in CT, pt was fine when she left department but developed symptoms in parking lot and went to emergency department.  ? Sulfa Antibiotics Shortness Of Breath  ? Amiodarone   ? Carbamazepine   ? REACTION: toxemia  ? Dilaudid [hydromorphone] Nausea And Vomiting  ? Tramadol Other (See Comments)  ? confusion  ? ?  ? ?  ?Medication List  ?  ? ?STOP taking these medications   ? ?diltiazem 120 MG 24 hr capsule ?Commonly known as: TIAZAC ?  ?furosemide 20 MG tablet ?Commonly known as: LASIX ?  ?isosorbide mononitrate 30 MG 24 hr tablet ?Commonly known as: IMDUR ?  ?potassium chloride 10 MEQ tablet ?Commonly known as: KLOR-CON M ?  ? ?  ? ?TAKE these medications   ? ?albuterol (2.5 MG/3ML) 0.083% nebulizer solution ?Commonly known as: PROVENTIL ?Take 3 mLs (2.5 mg total) by nebulization every 4 (four) hours as needed for wheezing or shortness of breath. ?  ?ALPRAZolam 0.25 MG tablet ?Commonly known as: [name] ?Take 1 tablet (0.25 mg total) by mouth every 8 (eight) hours as needed for anxiety. ?  ?D-5000 125 MCG (5000 UT) Tabs ?Generic drug: Cholecalciferol ?Take 5,000 Units by mouth daily. ?  ?digoxin 0.125 MG tablet ?Commonly known as: LANOXIN ?Take 1 tablet (0.125 mg total) by mouth every evening. ?   ?docusate sodium 100 MG capsule ?Commonly known as: COLACE ?Take 100 mg by mouth at bedtime. ?  ?famotidine 20 MG tablet ?Commonly known as: PEPCID ?Take 1 tablet (20 mg total) by mouth at bedtime. ?  ?fluticasone 50 MCG/ACT nasal spray ?Commonly known as: FLONASE ?Place 2 sprays into both nostrils daily. ?  ?ICaps MV Tabs ?Take 1 tablet by mouth in the morning and at bedtime. ?  ?levETIRAcetam 250 MG tablet ?Commonly known as: KEPPRA ?Take 125-250 mg by mouth See admin instructions. One tablet at 0900 and half a tablet at 1700 ?  ?levothyroxine 25 MCG tablet ?Commonly known as: SYNTHROID ?Take 25 mcg by mouth daily before breakfast. ?  ?melatonin 5 MG Tabs ?Take 5 mg by mouth at bedtime. ?  ?midodrine 2.5 MG tablet ?Commonly known as: PROAMATINE ?Take 1 tablet (2.5 mg total) by mouth 2 (two) times daily with a meal. The dosing can be titrated down to once daily when her systolic blood pressure is consistently in the 100s. ?  ?montelukast 10 MG tablet ?Commonly known as: SINGULAIR ?Take 10 mg by mouth daily. ?  ?pantoprazole 40 MG tablet ?Commonly known as: PROTONIX ?Take 1 tablet (40 mg total) by mouth daily. ?  ?predniSONE 20 MG tablet ?Commonly known as: DELTASONE ?Take 1 tablet (20 mg total) by mouth daily with breakfast for 5 days. ?Start taking on: August 20, 2021 ?What changed:  ?medication strength ?how much to take ?how  to take this ?when to take this ?additional instructions ?  ?sertraline 25 MG tablet ?Commonly known as: ZOLOFT ?Take 25 mg by mouth daily. ?  ?Trelegy Ellipta 100-62.5-25 MCG/ACT Aepb ?Generic drug: Fluticasone-Umeclidin-Vilant ?Inhale 1 puff into the lungs daily. ?  ? ?  ? ? ?Discharge Assessment: ?Vitals:  ? 08/18/21 2016 08/19/21 0919  ?BP: (!) 146/63   ?Pulse: (!) 57   ?Resp: 18   ?Temp: 98.1 ?F (36.7 ?C)   ?SpO2: 96% 95%  ? Skin clean, dry and intact without evidence of skin break down, no evidence of skin tears noted. ?IV catheter discontinued intact. Site without signs and symptoms  of complications - no redness or edema noted at insertion site, patient denies c/o pain - only slight tenderness at site.  Dressing with slight pressure applied. ? ?D/c Instructions-Education: ?Discharge instructions given to patient/family with verbalized understanding. ?D/c education completed with patient/family including follow up instructions, medication list, d/c activities limitations if indicated, with other d/c instructions as indicated by MD - patient able to verbalize understanding, all questions fully answered. ?Patient instructed to return to ED, call 911, or call MD for any changes in condition.  ?Patient escorted via WC, and D/C home via private auto. ? ?Karolee Ohs, RN ?08/19/2021 11:29 AM  ?

## 2021-08-19 NOTE — Anesthesia Postprocedure Evaluation (Signed)
Anesthesia Post Note ? ?Patient: Deborah [name] ? ?Procedure(s) Performed: ESOPHAGOGASTRODUODENOSCOPY (EGD) WITH PROPOFOL ? ?Patient location during evaluation: Phase II ?Anesthesia Type: General ?Level of consciousness: awake ?Pain management: pain level controlled ?Vital Signs Assessment: post-procedure vital signs reviewed and stable ?Respiratory status: spontaneous breathing and respiratory function stable ?Cardiovascular status: blood pressure returned to baseline and stable ?Postop Assessment: no headache and no apparent nausea or vomiting ?Anesthetic complications: no ?Comments: Late entry ? ? ?No notable events documented. ? ? ?Last Vitals:  ?Vitals:  ? 08/18/21 1748 08/18/21 2016  ?BP:  (!) 146/63  ?Pulse: 84 (!) 57  ?Resp:  18  ?Temp:  36.7 ?C  ?SpO2:  96%  ?  ?Last Pain:  ?Vitals:  ? 08/19/21 0623  ?TempSrc:   ?PainSc: 4   ? ? ?  ?  ?  ?  ?  ?  ? ?Windell Norfolk ? ? ? ? ?

## 2021-08-19 NOTE — Discharge Summary (Signed)
Physician Discharge Summary  ?[name] NFA:[id number] DOB: Sep 14, 1925 DOA: 08/16/2021 ? ?PCP: Marlowe Aschoff, FNP ? ?Admit date: 08/16/2021 ?Discharge date: 08/19/2021 ? ?Admitted From:  [facility] ?Disposition:  [facility]  ? ?Recommendations ?Please consult palliative care for outpatient palliative consult for goals of care discussions  ?Please check CBC in 1 week and periodically monitor CBC at least every 2-3 weeks ? ?Discharge Condition: STABLE   ?CODE STATUS: DNR ?DIET: low sodium  ? ?Brief Hospitalization Summary: ?Please see all hospital notes, images, labs for full details of the hospitalization. ?ADMISSION HPI: Deborah [name] is a 88 y.o. female with medical history significant for Alzheimer's dementia, coronary artery disease, permanent atrial fibrillation, diastolic CHF, hypertension, hyperlipidemia, COPD and seizure order who presents to the emergency department via EMS due to shortness of breath.  Patient was unable to provide details regarding the history, history was obtained from ED physician and ED medical record.  Per report, nursing staff at [facility] activated EMS due to concern for shortness of breath. ?Patient was admitted from 2/03/24/2014 due to acute respiratory failure with hypoxia and symptomatic anemia during which patient presented with hemoglobin of 5.8 and 2 units of PRBC was ordered and transfused, GI was consulted and an outpatient EGD/colonoscopy was planned at that time, patient's hemoglobin improved to 8-9 range prior to discharge. ?   ?ED Course:  ?In the emergency department, she was tachypneic, BP was soft at 83/56, O2 sat was 89% on room air.  Other vital signs were within normal range.  Work-up in the ED shows macrocytic anemia thrombocytosis, elevated MCV-104.9, BUN to creatinine 13/1.21 (baseline creatinine is 0.6-0.8), BNP 288 (this was 156 on 07/03/2021), troponin x2 was flat at 14.  Influenza A, B, SARS coronavirus 2 was negative.  Chest x-ray showed chronic changes  without acute abnormality.  1 unit of PRBC was ordered to be transfused in the ED.  Hospitalist was asked to admit patient for further evaluation and management.   ? ?HOSPITAL COURSE BY PROBLEM LIST ? ?Symptomatic macrocytic anemia: B12 and folic acid wnl/elevated. Recent iron panel w/elevated ferritin, iron, and 47% sat w/TIBC 384.  ?- s/p 1u PRBCs, recheck H/H shows improvement to 8.8g/dl. Stable on recheck. Repeat CBC in outpatient setting .  ?- Continue empiric PPI therapy for GI protection.  ?- GI recommending inpatient EGD. Done 3/29: Esophagus, stomach and duodenum were within normal limits.  No blood or mucosal lesions were found.   ?  ?Acute hypoxic respiratory failure: resolved now.  ?May be acute on chronic. LVEF normal without RWMA on echo last month.  ?- Continue supplemental oxygen if needed to maintain SpO2 >89%.  ?- Anxiety contributing to upper airway component, restart home xanax. ?- Continue controller COPD BDs, singulair, and prn albuterol. ?  ?Chronic HFpEF: With acute interstitial pulmonary edema ?- Treated with IV lasix  with good results.   ?- Continue to monitor I/O, volume status clinically in the outpatient setting. ?- did not restart oral lasix due to poor oral intake at baseline.   ?  ?Intake/Output Summary (Last 24 hours) at 08/18/2021 1427 ?Last data filed at 08/18/2021 1046 ?   ?Gross per 24 hour  ?Intake 1160 ml  ?Output 800 ml  ?Net 360 ml  ?  ?  ?AKI: Improving.  ?- Continue monitoring, avoiding nephrotoxins.   ?  ?Recent Labs  ?     ?Lab Results  ?Component Value Date  ?  CREATININE 0.64 08/18/2021  ?  CREATININE 0.72 08/17/2021  ?  CREATININE 0.95 08/16/2021  ?  ?  ?  ?Permanent atrial fibrillation: Initially with slow ventricular response in mid-40's on ECG, later up to rate in 80's.  ?- Continue digoxin. If BP improves, can restart home diltiazem. Continue home midodrine.   Pt having bradycardia and pauses, dig level, will DISCONTINUE diltiazem.   ?  ?Seizure disorder:  ?-  Continue keppra.  ?  ?Hypothyroidism:  ?- Continue synthroid ?  ?Unstageable right heel pressure injury: POA. Recommend offloading. ? ? ?Discharge Diagnoses:  ?Principal Problem: ?  Symptomatic anemia ?Active Problems: ?  Permanent atrial fibrillation ([place]) ?  COPD (chronic obstructive pulmonary disease) ([place]) ?  AKI (acute kidney injury) ([place]) ?  Elevated brain natriuretic peptide (BNP) level ?  Elevated MCV ?  Thrombocytosis ?  Hypothyroidism ?  GERD (gastroesophageal reflux disease) ? ? ?Discharge Instructions: ? ?Allergies as of 08/19/2021   ? ?   Reactions  ? Ivp Dye [iodinated Contrast Media] Shortness Of Breath  ? ??  ? Omnipaque [iohexol] Shortness Of Breath, Other (See Comments)  ? Short of breath with chest tightness after IV injection in CT, pt was fine when she left department but developed symptoms in parking lot and went to emergency department.  ? Sulfa Antibiotics Shortness Of Breath  ? Amiodarone   ? Carbamazepine   ? REACTION: toxemia  ? Dilaudid [hydromorphone] Nausea And Vomiting  ? Tramadol Other (See Comments)  ? confusion  ? ?  ? ?  ?Medication List  ?  ? ?STOP taking these medications   ? ?diltiazem 120 MG 24 hr capsule ?Commonly known as: TIAZAC ?  ?furosemide 20 MG tablet ?Commonly known as: LASIX ?  ?isosorbide mononitrate 30 MG 24 hr tablet ?Commonly known as: IMDUR ?  ?potassium chloride 10 MEQ tablet ?Commonly known as: KLOR-CON M ?  ? ?  ? ?TAKE these medications   ? ?albuterol (2.5 MG/3ML) 0.083% nebulizer solution ?Commonly known as: PROVENTIL ?Take 3 mLs (2.5 mg total) by nebulization every 4 (four) hours as needed for wheezing or shortness of breath. ?  ?ALPRAZolam 0.25 MG tablet ?Commonly known as: Duanne Moron ?Take 1 tablet (0.25 mg total) by mouth every 8 (eight) hours as needed for anxiety. ?  ?D-5000 125 MCG (5000 UT) Tabs ?Generic drug: Cholecalciferol ?Take 5,000 Units by mouth daily. ?  ?digoxin 0.125 MG tablet ?Commonly known as: LANOXIN ?Take 1 tablet (0.125 mg total) by mouth  every evening. ?  ?docusate sodium 100 MG capsule ?Commonly known as: COLACE ?Take 100 mg by mouth at bedtime. ?  ?famotidine 20 MG tablet ?Commonly known as: PEPCID ?Take 1 tablet (20 mg total) by mouth at bedtime. ?  ?fluticasone 50 MCG/ACT nasal spray ?Commonly known as: FLONASE ?Place 2 sprays into both nostrils daily. ?  ?ICaps MV Tabs ?Take 1 tablet by mouth in the morning and at bedtime. ?  ?levETIRAcetam 250 MG tablet ?Commonly known as: KEPPRA ?Take 125-250 mg by mouth See admin instructions. One tablet at 0900 and half a tablet at 1700 ?  ?levothyroxine 25 MCG tablet ?Commonly known as: SYNTHROID ?Take 25 mcg by mouth daily before breakfast. ?  ?melatonin 5 MG Tabs ?Take 5 mg by mouth at bedtime. ?  ?midodrine 2.5 MG tablet ?Commonly known as: PROAMATINE ?Take 1 tablet (2.5 mg total) by mouth 2 (two) times daily with a meal. The dosing can be titrated down to once daily when her systolic blood pressure is consistently in the 100s. ?  ?montelukast 10 MG tablet ?Commonly known  as: SINGULAIR ?Take 10 mg by mouth daily. ?  ?pantoprazole 40 MG tablet ?Commonly known as: PROTONIX ?Take 1 tablet (40 mg total) by mouth daily. ?  ?predniSONE 20 MG tablet ?Commonly known as: DELTASONE ?Take 1 tablet (20 mg total) by mouth daily with breakfast for 5 days. ?Start taking on: August 20, 2021 ?What changed:  ?medication strength ?how much to take ?how to take this ?when to take this ?additional instructions ?  ?sertraline 25 MG tablet ?Commonly known as: ZOLOFT ?Take 25 mg by mouth daily. ?  ?Trelegy Ellipta 100-62.5-25 MCG/ACT Aepb ?Generic drug: Fluticasone-Umeclidin-Vilant ?Inhale 1 puff into the lungs daily. ?  ? ?  ? ? Contact information for after-discharge care   ? ? Destination   ? ? [facility] .   ?Service: Assisted Living ?Contact information: ?[place] ?[address] ?[phone] ? ?  ?  ? ?  ?  ? ?  ?  ? ?  ? ?Allergies  ?Allergen Reactions  ? Ivp Dye [Iodinated  Contrast Media] Shortness Of Breath  ?  ??  ? Omnipaque [Iohexol] Shortness Of Breath and Other (See Comments)  ?  Short of breath with chest tightness after IV injection in CT, pt was fine when she left departm

## 2021-08-19 NOTE — Progress Notes (Signed)
Pt discharged back to [facility]. Daughter transported pt back to SNF via personal vehicle. Discharge summary paperwork sent with pt to SNF. No distress noted at time of discharge. ?

## 2021-08-19 NOTE — TOC Transition Note (Signed)
Transition of Care (TOC) - CM/SW Discharge Note ? ? ?Patient Details  ?Name: Deborah [name] ?MRN: [id number] ?Date of Birth: 1925/06/27 ? ?Transition of Care (TOC) CM/SW Contact:  ?Mehtab Dolberry D, LCSW ?Phone Number: ?08/19/2021, 11:54 AM ? ? ?Clinical Narrative:    ?Discharge clinicals sent to facility. Per nurse, daughter will transport to the facility. TOC signing off.  ? ? ?Final next level of care: Home w Home Health Services ?Barriers to Discharge: No Barriers Identified ? ? ?Patient Goals and CMS Choice ?Patient states their goals for this hospitalization and ongoing recovery are:: Return to ALF ?  ?  ? ?Discharge Placement ?  ?           ?  ?  ?  ?  ? ?Discharge Plan and Services ?In-house Referral: Clinical Social Work ?  ?Post Acute Care Choice: Resumption of Svcs/PTA Provider          ?  ?  ?  ?  ?  ?HH Arranged: PT, RN ?HH Agency: [facility] ?Date HH Agency Contacted: 08/19/21 ?Time HH Agency Contacted: 1154 ?Representative spoke with at [facility] Agency: sarah ? ?Social Determinants of Health (SDOH) Interventions ?  ? ? ?Readmission Risk Interventions ? ?  08/16/2021  ? 10:38 AM 07/05/2021  ? 11:34 AM  ?Readmission Risk Prevention Plan  ?Transportation Screening Complete Complete  ?HRI or Home Care Consult Complete Complete  ?Social Work Consult for Recovery Care Planning/Counseling Complete Complete  ?Palliative Care Screening Not Applicable Not Applicable  ?Medication Review Oceanographer) Complete Complete  ? ? ? ? ? ?

## 2021-08-19 NOTE — Plan of Care (Signed)

## 2021-08-20 ENCOUNTER — Encounter (HOSPITAL_COMMUNITY): Payer: Self-pay | Admitting: Gastroenterology

## 2021-08-27 ENCOUNTER — Non-Acute Institutional Stay: Payer: Self-pay | Admitting: Nurse Practitioner

## 2021-08-27 ENCOUNTER — Encounter: Payer: Self-pay | Admitting: Nurse Practitioner

## 2021-08-27 DIAGNOSIS — J449 Chronic obstructive pulmonary disease, unspecified: Secondary | ICD-10-CM

## 2021-08-27 DIAGNOSIS — R531 Weakness: Secondary | ICD-10-CM

## 2021-08-27 DIAGNOSIS — Z515 Encounter for palliative care: Secondary | ICD-10-CM

## 2021-08-27 DIAGNOSIS — R0602 Shortness of breath: Secondary | ICD-10-CM

## 2021-08-27 NOTE — Progress Notes (Signed)
? ? ?Manufacturing engineer ?Community Palliative Care Consult Note ?Telephone: [phone]  ?Fax: [phone]  ? ? ?Date of encounter: 08/27/21 ?5:50 PM ?PATIENT NAME: Deborah [name] ?[address] ?[place] [address]   ?[phone] (home)  ?DOB: 1925/12/06 ?MRN: [id number] ?PRIMARY CARE PROVIDER:   Robley Fries; locked memory care unit ?Lynnell Catalan, FNP,  ?[phone] ?[place] [address] ?[phone] ? ?RESPONSIBLE PARTY:    ?Contact Information   ? ? Name Relation Home Work Mobile  ? Susa Raring Daughter   [phone]  ? ?  ? ?I met face to face with patient in facility. Palliative Care was asked to follow this patient by consultation request of  Polite, Amador Cunas, FNP to address advance care planning and complex medical decision making. This is a follow up visit.                                  ?ASSESSMENT AND PLAN / RECOMMENDATIONS:  ?Symptom Management/Plan: ?1. Advance Care Planning;  DNR ?  ?2. Shortness of breath secondary to COPD; continue inhalation therapy, monitoring respirations ?  ?3. Generalized weakness secondary to  macrocytic anemia thrombocytosis; Alzheimers dementia, continue to encourage mobility as able, self independence, fall risk, continue to ambulate with walker; continue with outpatient EGD/Colonoscopy; monitor labs by facility provider ?  ?4. Palliative care encounter; Palliative care encounter; Palliative medicine team will continue to support patient, patient's family, and medical team. Visit consisted of counseling and education dealing with the complex and emotionally intense issues of symptom management and palliative care in the setting of serious and potentially life-threatening illness ? ?Follow up Palliative Care Visit: Palliative care will continue to follow for complex medical decision making, advance care planning, and clarification of goals. Return 4 weeks or prn. ? ?I spent 42 minutes providing this consultation. More than  50% of the time in this consultation was spent in counseling and care coordination. ? ?PPS: 40% ? ?Chief Complaint: Follow up palliative consult for complex medical decision making ? ?HISTORY OF PRESENT ILLNESS:  Deborah [name] is a 88 y.o. year old female  with multiple medical problems including Alzheimer's dementia, coronary artery disease, permanent atrial fibrillation, diastolic CHF, hypertension, hyperlipidemia, COPD and seizure. Deborah [name] resides in [place] [facility] at [facility]. Deborah [name] requires assistance with transfers to w/c, assistance with ADL's. Deborah [name] does feed herself with declined appetite. Staff endorses recent hospitalization for symptomatic anemia from 08/16/2021 to 08/19/2021 with initial hgb 5.8 requiring to be transfused 2 units PRBC's; with outpatient EGD/Colonoscopy planned. Staff endorses Deborah [name] has been having some days where she is very tired, does not get out of bed, other days she is interactive, comes to the dining area to eat. Reviewed ROS, symptoms with staff. At present Deborah [name] is sitting in the dining area at a table by herself, waving. I sat with Deborah [name]. Deborah [name] was able to make eye contact, answer simple questions. Deborah [name] was able to say her full name otherwise no meaningful conversations, she was cooperative with assessment. Medical goals reviewed. Support provided. I talked with staff about symptoms of fatigue, sleeping on and off, more alert interactive times with progression of dementia. Attempted to contact family, will continue for further discussion of goc. Will f/u in 4 weeks or sooner if needed. [facility] staff.  ? ?History obtained from review of EMR, discussion with facility staff and Deborah [name].  ?  I reviewed available labs, medications, imaging, studies and related documents from the EMR.  Records reviewed and summarized above.  ? ?ROS ?10 point system reviewed with staff as Deborah [name] is cognitively impaired all negative except  HPI ? ?Physical Exam: ?Constitutional: NAD ?General: frail appearing, confused female ?EYES: lids intact ?ENMT: oral mucous membranes moist ?CV: S1S2, RRR ?Pulmonary: LCTA, no increased work of breathing, no cough, room air ?Abdomen: soft and non tender ?MSK: w/c ?Skin: warm and dry ?Neuro:  + generalized weakness,  + cognitive impairment ?Psych: non-anxious affect, Alert, confused ?Thank you for the opportunity to participate in the care of Deborah [name].  The palliative care team will continue to follow. Please call our office at [phone] if we can be of additional assistance.  ? ?Tyresse Jayson Z Alianah Lofton, NP   ?

## 2021-08-30 ENCOUNTER — Encounter (HOSPITAL_COMMUNITY): Payer: Self-pay | Admitting: Emergency Medicine

## 2021-08-30 ENCOUNTER — Inpatient Hospital Stay (HOSPITAL_COMMUNITY)
Admission: EM | Admit: 2021-08-30 | Discharge: 2021-09-01 | DRG: 177 | Disposition: A | Discharge status: Hospice | Payer: Medicare Other | Source: Skilled Nursing Facility | Attending: Family Medicine | Admitting: Family Medicine

## 2021-08-30 ENCOUNTER — Other Ambulatory Visit: Payer: Self-pay

## 2021-08-30 ENCOUNTER — Emergency Department (HOSPITAL_COMMUNITY): Payer: Medicare Other

## 2021-08-30 ENCOUNTER — Inpatient Hospital Stay (HOSPITAL_COMMUNITY): Payer: Medicare Other

## 2021-08-30 DIAGNOSIS — F02818 Dementia in other diseases classified elsewhere, unspecified severity, with other behavioral disturbance: Secondary | ICD-10-CM | POA: Diagnosis present

## 2021-08-30 DIAGNOSIS — I5032 Chronic diastolic (congestive) heart failure: Secondary | ICD-10-CM | POA: Diagnosis present

## 2021-08-30 DIAGNOSIS — Z86711 Personal history of pulmonary embolism: Secondary | ICD-10-CM

## 2021-08-30 DIAGNOSIS — G309 Alzheimer's disease, unspecified: Secondary | ICD-10-CM | POA: Diagnosis present

## 2021-08-30 DIAGNOSIS — J9621 Acute and chronic respiratory failure with hypoxia: Secondary | ICD-10-CM | POA: Diagnosis present

## 2021-08-30 DIAGNOSIS — Z881 Allergy status to other antibiotic agents status: Secondary | ICD-10-CM

## 2021-08-30 DIAGNOSIS — G9341 Metabolic encephalopathy: Secondary | ICD-10-CM | POA: Diagnosis present

## 2021-08-30 DIAGNOSIS — J449 Chronic obstructive pulmonary disease, unspecified: Secondary | ICD-10-CM | POA: Diagnosis present

## 2021-08-30 DIAGNOSIS — I2699 Other pulmonary embolism without acute cor pulmonale: Secondary | ICD-10-CM | POA: Diagnosis present

## 2021-08-30 DIAGNOSIS — U071 COVID-19: Principal | ICD-10-CM | POA: Diagnosis present

## 2021-08-30 DIAGNOSIS — Z79899 Other long term (current) drug therapy: Secondary | ICD-10-CM

## 2021-08-30 DIAGNOSIS — Z91041 Radiographic dye allergy status: Secondary | ICD-10-CM | POA: Diagnosis not present

## 2021-08-30 DIAGNOSIS — J441 Chronic obstructive pulmonary disease with (acute) exacerbation: Secondary | ICD-10-CM | POA: Diagnosis present

## 2021-08-30 DIAGNOSIS — F0280 Dementia in other diseases classified elsewhere without behavioral disturbance: Secondary | ICD-10-CM | POA: Diagnosis present

## 2021-08-30 DIAGNOSIS — E782 Mixed hyperlipidemia: Secondary | ICD-10-CM | POA: Diagnosis present

## 2021-08-30 DIAGNOSIS — Z515 Encounter for palliative care: Secondary | ICD-10-CM | POA: Diagnosis not present

## 2021-08-30 DIAGNOSIS — I482 Chronic atrial fibrillation, unspecified: Secondary | ICD-10-CM | POA: Diagnosis present

## 2021-08-30 DIAGNOSIS — D500 Iron deficiency anemia secondary to blood loss (chronic): Secondary | ICD-10-CM | POA: Diagnosis present

## 2021-08-30 DIAGNOSIS — Z888 Allergy status to other drugs, medicaments and biological substances status: Secondary | ICD-10-CM | POA: Diagnosis not present

## 2021-08-30 DIAGNOSIS — Z87891 Personal history of nicotine dependence: Secondary | ICD-10-CM

## 2021-08-30 DIAGNOSIS — Z66 Do not resuscitate: Secondary | ICD-10-CM | POA: Diagnosis present

## 2021-08-30 DIAGNOSIS — Z8249 Family history of ischemic heart disease and other diseases of the circulatory system: Secondary | ICD-10-CM

## 2021-08-30 DIAGNOSIS — I9589 Other hypotension: Secondary | ICD-10-CM | POA: Diagnosis present

## 2021-08-30 DIAGNOSIS — J9601 Acute respiratory failure with hypoxia: Secondary | ICD-10-CM | POA: Diagnosis present

## 2021-08-30 DIAGNOSIS — I2510 Atherosclerotic heart disease of native coronary artery without angina pectoris: Secondary | ICD-10-CM | POA: Diagnosis present

## 2021-08-30 DIAGNOSIS — I272 Pulmonary hypertension, unspecified: Secondary | ICD-10-CM | POA: Diagnosis present

## 2021-08-30 DIAGNOSIS — Z79890 Hormone replacement therapy (postmenopausal): Secondary | ICD-10-CM

## 2021-08-30 LAB — FERRITIN: Ferritin: 839 ng/mL — ABNORMAL HIGH (ref 11–307)

## 2021-08-30 LAB — URINALYSIS, ROUTINE W REFLEX MICROSCOPIC
Bacteria, UA: NONE SEEN
Bilirubin Urine: NEGATIVE
Glucose, UA: NEGATIVE mg/dL
Hgb urine dipstick: NEGATIVE
Ketones, ur: NEGATIVE mg/dL
Nitrite: NEGATIVE
Protein, ur: 30 mg/dL — AB
Specific Gravity, Urine: 1.015 (ref 1.005–1.030)
pH: 5 (ref 5.0–8.0)

## 2021-08-30 LAB — CBC WITH DIFFERENTIAL/PLATELET
Abs Immature Granulocytes: 0.15 10*3/uL — ABNORMAL HIGH (ref 0.00–0.07)
Basophils Absolute: 0 10*3/uL (ref 0.0–0.1)
Basophils Relative: 0 %
Eosinophils Absolute: 0 10*3/uL (ref 0.0–0.5)
Eosinophils Relative: 0 %
HCT: 29.6 % — ABNORMAL LOW (ref 36.0–46.0)
Hemoglobin: 9.3 g/dL — ABNORMAL LOW (ref 12.0–15.0)
Immature Granulocytes: 2 %
Lymphocytes Relative: 3 %
Lymphs Abs: 0.3 10*3/uL — ABNORMAL LOW (ref 0.7–4.0)
MCH: 31.6 pg (ref 26.0–34.0)
MCHC: 31.4 g/dL (ref 30.0–36.0)
MCV: 100.7 fL — ABNORMAL HIGH (ref 80.0–100.0)
Monocytes Absolute: 0.1 10*3/uL (ref 0.1–1.0)
Monocytes Relative: 1 %
Neutro Abs: 7.6 10*3/uL (ref 1.7–7.7)
Neutrophils Relative %: 94 %
Platelets: 217 10*3/uL (ref 150–400)
RBC: 2.94 MIL/uL — ABNORMAL LOW (ref 3.87–5.11)
RDW: 21 % — ABNORMAL HIGH (ref 11.5–15.5)
WBC: 8.1 10*3/uL (ref 4.0–10.5)
nRBC: 0 % (ref 0.0–0.2)

## 2021-08-30 LAB — COMPREHENSIVE METABOLIC PANEL
ALT: 9 U/L (ref 0–44)
AST: 20 U/L (ref 15–41)
Albumin: 3.4 g/dL — ABNORMAL LOW (ref 3.5–5.0)
Alkaline Phosphatase: 65 U/L (ref 38–126)
Anion gap: 8 (ref 5–15)
BUN: 23 mg/dL (ref 8–23)
CO2: 29 mmol/L (ref 22–32)
Calcium: 8.3 mg/dL — ABNORMAL LOW (ref 8.9–10.3)
Chloride: 99 mmol/L (ref 98–111)
Creatinine, Ser: 0.87 mg/dL (ref 0.44–1.00)
GFR, Estimated: >60 mL/min (ref >60)
Glucose, Bld: 119 mg/dL — ABNORMAL HIGH (ref 70–99)
Potassium: 3.9 mmol/L (ref 3.5–5.1)
Sodium: 136 mmol/L (ref 135–145)
Total Bilirubin: 0.6 mg/dL (ref 0.3–1.2)
Total Protein: 6.2 g/dL — ABNORMAL LOW (ref 6.5–8.1)

## 2021-08-30 LAB — PROTIME-INR
INR: 1.2 (ref 0.8–1.2)
Prothrombin Time: 14.8 seconds (ref 11.4–15.2)

## 2021-08-30 LAB — LACTIC ACID, PLASMA: Lactic Acid, Venous: 1.4 mmol/L (ref 0.5–1.9)

## 2021-08-30 LAB — RESP PANEL BY RT-PCR (FLU A&B, COVID) ARPGX2
Influenza A by PCR: NEGATIVE
Influenza B by PCR: NEGATIVE
SARS Coronavirus 2 by RT PCR: POSITIVE — AB

## 2021-08-30 LAB — GLUCOSE, CAPILLARY: Glucose-Capillary: 107 mg/dL — ABNORMAL HIGH (ref 70–99)

## 2021-08-30 LAB — DIGOXIN LEVEL: Digoxin Level: 0.8 ng/mL (ref 0.8–2.0)

## 2021-08-30 LAB — CBG MONITORING, ED: Glucose-Capillary: 168 mg/dL — ABNORMAL HIGH (ref 70–99)

## 2021-08-30 LAB — C-REACTIVE PROTEIN: CRP: 19.8 mg/dL — ABNORMAL HIGH (ref <1.0)

## 2021-08-30 LAB — D-DIMER, QUANTITATIVE: D-Dimer, Quant: 2.07 ug/mL-FEU — ABNORMAL HIGH (ref 0.00–0.50)

## 2021-08-30 MED ORDER — BISACODYL 10 MG RE SUPP: 10.0000 mg | Freq: Every day | RECTAL | Status: DC | PRN

## 2021-08-30 MED ORDER — GUAIFENESIN-DM 100-10 MG/5ML PO SYRP: 10.0000 mL | ORAL_SOLUTION | ORAL | Status: DC | PRN

## 2021-08-30 MED ORDER — ACETAMINOPHEN 650 MG RE SUPP: 650.0000 mg | Freq: Four times a day (QID) | RECTAL | Status: DC | PRN

## 2021-08-30 MED ORDER — ZINC SULFATE 220 (50 ZN) MG PO CAPS
220.0000 mg | ORAL_CAPSULE | Freq: Every day | ORAL | Status: DC
  Administered 2021-08-30: 220 mg via ORAL
  Filled 2021-08-30: qty 1

## 2021-08-30 MED ORDER — ONDANSETRON HCL 4 MG/2ML IJ SOLN: 4.0000 mg | Freq: Four times a day (QID) | INTRAMUSCULAR | Status: DC | PRN

## 2021-08-30 MED ORDER — HYDROCORTISONE SOD SUC (PF) 100 MG IJ SOLR
200.0000 mg | Freq: Once | INTRAMUSCULAR | Status: AC
  Administered 2021-08-30: 200 mg via INTRAVENOUS
  Filled 2021-08-30: qty 4

## 2021-08-30 MED ORDER — FLUTICASONE FUROATE-VILANTEROL 100-25 MCG/ACT IN AEPB
1.0000 | INHALATION_SPRAY | Freq: Every day | RESPIRATORY_TRACT | Status: DC
  Administered 2021-08-31: 1 via RESPIRATORY_TRACT
  Filled 2021-08-30: qty 28

## 2021-08-30 MED ORDER — MIDODRINE HCL 5 MG PO TABS
5.0000 mg | ORAL_TABLET | Freq: Three times a day (TID) | ORAL | Status: DC
  Administered 2021-08-30: 5 mg via ORAL
  Filled 2021-08-30: qty 1

## 2021-08-30 MED ORDER — SODIUM CHLORIDE 0.9 % IV SOLN: 250.0000 mL | INTRAVENOUS | Status: DC | PRN

## 2021-08-30 MED ORDER — SODIUM CHLORIDE 0.9 % IV BOLUS
500.0000 mL | Freq: Once | INTRAVENOUS | Status: AC
  Administered 2021-08-30: 500 mL via INTRAVENOUS

## 2021-08-30 MED ORDER — SODIUM CHLORIDE 0.9% FLUSH
3.0000 mL | Freq: Two times a day (BID) | INTRAVENOUS | Status: DC
  Administered 2021-08-30: 3 mL via INTRAVENOUS

## 2021-08-30 MED ORDER — SODIUM CHLORIDE 0.9% FLUSH: 3.0000 mL | INTRAVENOUS | Status: DC | PRN

## 2021-08-30 MED ORDER — DIPHENHYDRAMINE HCL 50 MG/ML IJ SOLN: 50.0000 mg | Freq: Once | INTRAMUSCULAR | Status: DC

## 2021-08-30 MED ORDER — IPRATROPIUM-ALBUTEROL 0.5-2.5 (3) MG/3ML IN SOLN: 3.0000 mL | Freq: Four times a day (QID) | RESPIRATORY_TRACT | Status: DC

## 2021-08-30 MED ORDER — POLYETHYLENE GLYCOL 3350 17 G PO PACK: 17.0000 g | PACK | Freq: Every day | ORAL | Status: DC | PRN

## 2021-08-30 MED ORDER — DOCUSATE SODIUM 100 MG PO CAPS: 100.0000 mg | ORAL_CAPSULE | Freq: Every day | ORAL | Status: DC

## 2021-08-30 MED ORDER — INSULIN ASPART 100 UNIT/ML IJ SOLN
0.0000 [IU] | Freq: Three times a day (TID) | INTRAMUSCULAR | Status: DC
  Administered 2021-08-30: 2 [IU] via SUBCUTANEOUS
  Filled 2021-08-30: qty 1

## 2021-08-30 MED ORDER — ACETAMINOPHEN 325 MG PO TABS: 650.0000 mg | ORAL_TABLET | Freq: Four times a day (QID) | ORAL | Status: DC | PRN

## 2021-08-30 MED ORDER — METHYLPREDNISOLONE SODIUM SUCC 125 MG IJ SOLR
125.0000 mg | Freq: Once | INTRAMUSCULAR | Status: AC
  Administered 2021-08-30: 125 mg via INTRAVENOUS
  Filled 2021-08-30: qty 2

## 2021-08-30 MED ORDER — LEVOTHYROXINE SODIUM 25 MCG PO TABS
25.0000 ug | ORAL_TABLET | Freq: Every day | ORAL | Status: DC
  Administered 2021-08-30: 25 ug via ORAL
  Filled 2021-08-30: qty 1

## 2021-08-30 MED ORDER — FLUTICASONE FUROATE-VILANTEROL 100-25 MCG/ACT IN AEPB: 1.0000 | INHALATION_SPRAY | Freq: Every day | RESPIRATORY_TRACT | Status: DC

## 2021-08-30 MED ORDER — INSULIN ASPART 100 UNIT/ML IJ SOLN: 0.0000 [IU] | Freq: Every day | INTRAMUSCULAR | Status: DC

## 2021-08-30 MED ORDER — SODIUM CHLORIDE 0.9 % IV SOLN
1.0000 g | INTRAVENOUS | Status: DC
  Administered 2021-08-30: 1 g via INTRAVENOUS
  Filled 2021-08-30: qty 10

## 2021-08-30 MED ORDER — UMECLIDINIUM-VILANTEROL 62.5-25 MCG/ACT IN AEPB
1.0000 | INHALATION_SPRAY | Freq: Every day | RESPIRATORY_TRACT | Status: DC
  Administered 2021-08-31: 1 via RESPIRATORY_TRACT
  Filled 2021-08-30: qty 14

## 2021-08-30 MED ORDER — METHYLPREDNISOLONE SODIUM SUCC 40 MG IJ SOLR
40.0000 mg | Freq: Two times a day (BID) | INTRAMUSCULAR | Status: DC
  Administered 2021-08-30: 40 mg via INTRAVENOUS
  Filled 2021-08-30: qty 1

## 2021-08-30 MED ORDER — LEVETIRACETAM 250 MG PO TABS
250.0000 mg | ORAL_TABLET | Freq: Every day | ORAL | Status: DC
  Administered 2021-08-30: 250 mg via ORAL
  Filled 2021-08-30: qty 1

## 2021-08-30 MED ORDER — HYDROCOD POLI-CHLORPHE POLI ER 10-8 MG/5ML PO SUER: 5.0000 mL | Freq: Two times a day (BID) | ORAL | Status: DC | PRN

## 2021-08-30 MED ORDER — LORAZEPAM 2 MG/ML IJ SOLN: 0.5000 mg | Freq: Two times a day (BID) | INTRAMUSCULAR | Status: DC | PRN

## 2021-08-30 MED ORDER — IPRATROPIUM-ALBUTEROL 20-100 MCG/ACT IN AERS
1.0000 | INHALATION_SPRAY | Freq: Four times a day (QID) | RESPIRATORY_TRACT | Status: DC
  Administered 2021-08-30: 1 via RESPIRATORY_TRACT
  Filled 2021-08-30: qty 4

## 2021-08-30 MED ORDER — NIRMATRELVIR/RITONAVIR (PAXLOVID)TABLET
3.0000 | ORAL_TABLET | Freq: Two times a day (BID) | ORAL | Status: DC
  Filled 2021-08-30: qty 30

## 2021-08-30 MED ORDER — UMECLIDINIUM BROMIDE 62.5 MCG/ACT IN AEPB
1.0000 | INHALATION_SPRAY | Freq: Every day | RESPIRATORY_TRACT | Status: DC
Start: 2021-08-30 — End: 2021-08-30

## 2021-08-30 MED ORDER — DOCUSATE SODIUM 100 MG PO CAPS
100.0000 mg | ORAL_CAPSULE | Freq: Two times a day (BID) | ORAL | Status: DC
  Administered 2021-08-30: 100 mg via ORAL
  Filled 2021-08-30: qty 1

## 2021-08-30 MED ORDER — ALBUTEROL SULFATE HFA 108 (90 BASE) MCG/ACT IN AERS
2.0000 | INHALATION_SPRAY | RESPIRATORY_TRACT | Status: DC | PRN
  Administered 2021-08-30 – 2021-08-31 (×2): 2 via RESPIRATORY_TRACT
  Filled 2021-08-30: qty 6.7

## 2021-08-30 MED ORDER — TRAZODONE HCL 50 MG PO TABS: 50.0000 mg | ORAL_TABLET | Freq: Every evening | ORAL | Status: DC | PRN

## 2021-08-30 MED ORDER — MELATONIN 5 MG PO TABS
5.0000 mg | ORAL_TABLET | Freq: Every day | ORAL | Status: DC
  Administered 2021-08-30: 5 mg via ORAL
  Filled 2021-08-30 (×2): qty 1

## 2021-08-30 MED ORDER — PANTOPRAZOLE SODIUM 40 MG PO TBEC
40.0000 mg | DELAYED_RELEASE_TABLET | Freq: Every day | ORAL | Status: DC
  Administered 2021-08-30: 40 mg via ORAL
  Filled 2021-08-30: qty 1

## 2021-08-30 MED ORDER — UMECLIDINIUM BROMIDE 62.5 MCG/ACT IN AEPB: 1.0000 | INHALATION_SPRAY | Freq: Every day | RESPIRATORY_TRACT | Status: DC

## 2021-08-30 MED ORDER — DIPHENHYDRAMINE HCL 25 MG PO CAPS: 50.0000 mg | ORAL_CAPSULE | Freq: Once | ORAL | Status: DC

## 2021-08-30 MED ORDER — MONTELUKAST SODIUM 10 MG PO TABS
10.0000 mg | ORAL_TABLET | Freq: Every day | ORAL | Status: DC
  Administered 2021-08-30: 10 mg via ORAL
  Filled 2021-08-30: qty 1

## 2021-08-30 MED ORDER — ALPRAZOLAM 0.5 MG PO TABS
0.5000 mg | ORAL_TABLET | Freq: Two times a day (BID) | ORAL | Status: DC
  Administered 2021-08-30: 0.5 mg via ORAL
  Filled 2021-08-30: qty 1

## 2021-08-30 MED ORDER — MIDODRINE HCL 5 MG PO TABS: 5.0000 mg | ORAL_TABLET | Freq: Two times a day (BID) | ORAL | Status: DC

## 2021-08-30 MED ORDER — UMECLIDINIUM-VILANTEROL 62.5-25 MCG/ACT IN AEPB: 1.0000 | INHALATION_SPRAY | Freq: Every day | RESPIRATORY_TRACT | Status: DC

## 2021-08-30 MED ORDER — FERROUS SULFATE 325 (65 FE) MG PO TABS
325.0000 mg | ORAL_TABLET | Freq: Every day | ORAL | Status: DC
  Administered 2021-08-30: 325 mg via ORAL
  Filled 2021-08-30: qty 1

## 2021-08-30 MED ORDER — ADULT MULTIVITAMIN W/MINERALS CH
1.0000 | ORAL_TABLET | Freq: Every day | ORAL | Status: DC
  Administered 2021-08-30: 1 via ORAL
  Filled 2021-08-30: qty 1

## 2021-08-30 MED ORDER — FAMOTIDINE 20 MG PO TABS
20.0000 mg | ORAL_TABLET | Freq: Every day | ORAL | Status: DC
  Administered 2021-08-30: 20 mg via ORAL
  Filled 2021-08-30: qty 1

## 2021-08-30 MED ORDER — ASCORBIC ACID 500 MG PO TABS
500.0000 mg | ORAL_TABLET | Freq: Every day | ORAL | Status: DC
  Administered 2021-08-30: 500 mg via ORAL
  Filled 2021-08-30: qty 1

## 2021-08-30 MED ORDER — TECHNETIUM TO 99M ALBUMIN AGGREGATED
4.0000 | Freq: Once | INTRAVENOUS | Status: AC | PRN
  Administered 2021-08-30: 4 via INTRAVENOUS

## 2021-08-30 MED ORDER — NIRMATRELVIR/RITONAVIR (PAXLOVID) TABLET (RENAL DOSING)
2.0000 | ORAL_TABLET | Freq: Two times a day (BID) | ORAL | Status: DC
  Administered 2021-08-30 (×2): 2 via ORAL
  Filled 2021-08-30: qty 20

## 2021-08-30 MED ORDER — ONDANSETRON HCL 4 MG PO TABS: 4.0000 mg | ORAL_TABLET | Freq: Four times a day (QID) | ORAL | Status: DC | PRN

## 2021-08-30 MED ORDER — DIGOXIN 125 MCG PO TABS
0.1250 mg | ORAL_TABLET | Freq: Every evening | ORAL | Status: DC
  Administered 2021-08-30: 0.125 mg via ORAL
  Filled 2021-08-30: qty 1

## 2021-08-30 MED ORDER — UMECLIDINIUM BROMIDE 62.5 MCG/ACT IN AEPB
1.0000 | INHALATION_SPRAY | Freq: Every day | RESPIRATORY_TRACT | Status: DC
  Administered 2021-08-31: 1 via RESPIRATORY_TRACT
  Filled 2021-08-30: qty 7

## 2021-08-30 NOTE — ED Provider Notes (Signed)
? ?[facility]GENCY DEPARTMENT  ?Provider Note ? ?CSN: [id number] ?Arrival date & time: 08/30/21 0459 ? ?History ?Chief Complaint  ?Patient presents with  ? Respiratory Distress  ? ? ?Deborah [name] is a 88 y.o. female with history of COPD and CHF has had increased SOB and cough for the last few days. No reported fevers. Staff had been giving her nebulizer treatments but not getting much better. She was still wheezing so staff there checked a Covid test which was positive. She was noted to be hypoxic this morning, does not usually wear oxygen there. Recently admission for anemia, AKI, COPD. Recommended palliative care/hospice evaluation when back at [facility]. Staff there indicate that was scheduled to happen later today. Patient denies any pain, some SOB. She was started on NRB PTA.  ? ? ?Home Medications ?Prior to Admission medications   ?Medication Sig Start Date End Date Taking? Authorizing Provider  ?albuterol (PROVENTIL) (2.5 MG/3ML) 0.083% nebulizer solution Take 3 mLs (2.5 mg total) by nebulization every 4 (four) hours as needed for wheezing or shortness of breath. 07/07/21 07/07/22  Erick Blinks, MD  ?ALPRAZolam [name]) 0.25 MG tablet Take 1 tablet (0.25 mg total) by mouth every 8 (eight) hours as needed for anxiety. 08/19/21   Johnson, Clanford L, MD  ?D-5000 125 MCG (5000 UT) TABS Take 5,000 Units by mouth daily. 11/11/20   [provider]  ?digoxin (LANOXIN) 0.125 MG tablet Take 1 tablet (0.125 mg total) by mouth every evening. 09/21/16   [name], [name], MD  ?docusate sodium (COLACE) 100 MG capsule Take 100 mg by mouth at bedtime.    [provider]  ?famotidine (PEPCID) 20 MG tablet Take 1 tablet (20 mg total) by mouth at bedtime. 09/02/20   [name], MD  ?fluticasone [facility]) 50 MCG/ACT nasal spray Place 2 sprays into both nostrils daily.    [provider]  ?levETIRAcetam (KEPPRA) 250 MG tablet Take 125-250 mg by mouth See admin instructions. One tablet at 0900  and half a tablet at 1700    [provider]  ?levothyroxine (SYNTHROID, LEVOTHROID) 25 MCG tablet Take 25 mcg by mouth daily before breakfast.    [provider]  ?melatonin 5 MG TABS Take 5 mg by mouth at bedtime.    [provider]  ?midodrine (PROAMATINE) 2.5 MG tablet Take 1 tablet (2.5 mg total) by mouth 2 (two) times daily with a meal. The dosing can be titrated down to once daily when her systolic blood pressure is consistently in the 100s. 05/04/12   [name], MD  ?montelukast (SINGULAIR) 10 MG tablet Take 10 mg by mouth daily.  09/13/17   [provider]  ?Multiple Vitamins-Minerals (ICAPS MV) TABS Take 1 tablet by mouth in the morning and at bedtime.    [provider]  ?pantoprazole (PROTONIX) 40 MG tablet Take 1 tablet (40 mg total) by mouth daily. 07/08/21   Erick Blinks, MD  ?sertraline (ZOLOFT) 25 MG tablet Take 25 mg by mouth daily.    [provider]  ?Dwyane Luo 100-62.5-25 MCG/INH AEPB Inhale 1 puff into the lungs daily. 07/07/20   [provider]  ? ? ? ?Allergies    ?Ivp dye [iodinated contrast media], Omnipaque [iohexol], Sulfa antibiotics, Amiodarone, Carbamazepine, Dilaudid [hydromorphone], and Tramadol ? ? ?Review of Systems   ?Review of Systems ?Please see HPI for pertinent positives and negatives ? ?Physical Exam ?BP 100/66 (BP Location: Right Arm)   Pulse (!) 101   Temp 98.9 ?F (  37.2 ?C) (Rectal)   Resp (!) 21   Ht 5\' 8"  (1.727 m)   Wt 65 kg   SpO2 100%   BMI 21.79 kg/m?  ? ?Physical Exam ?Vitals and nursing note reviewed.  ?Constitutional:   ?   Appearance: Normal appearance.  ?HENT:  ?   Head: Normocephalic and atraumatic.  ?   Nose: Nose normal.  ?   Mouth/Throat:  ?   Mouth: Mucous membranes are moist.  ?Eyes:  ?   Extraocular Movements: Extraocular movements intact.  ?   Conjunctiva/sclera: Conjunctivae normal.  ?Cardiovascular:  ?   Rate and Rhythm: Tachycardia present. Rhythm irregular.  ?Pulmonary:   ?   Effort: Pulmonary effort is normal.  ?   Breath sounds: Wheezing and rhonchi present.  ?Abdominal:  ?   General: Abdomen is flat.  ?   Palpations: Abdomen is soft.  ?   Tenderness: There is no abdominal tenderness.  ?Musculoskeletal:     ?   General: No swelling. Normal range of motion.  ?   Cervical back: Neck supple.  ?Skin: ?   General: Skin is warm and dry.  ?Neurological:  ?   General: No focal deficit present.  ?   Mental Status: She is alert.  ?Psychiatric:     ?   Mood and Affect: Mood normal.  ? ? ?ED Results / Procedures / Treatments   ?EKG ?EKG Interpretation ? ?Date/Time:  Monday August 30 2021 05:06:26 EDT ?Ventricular Rate:  114 ?PR Interval:    ?QRS Duration: 85 ?QT Interval:  283 ?QTC Calculation: 390 ?R Axis:   50 ?Text Interpretation: Atrial fibrillation Repol abnrm No significant change since last tracing Confirmed by 07-29-2006 [phone]) on 08/30/2021 5:19:12 AM ? ?Procedures ?Procedures ? ?Medications Ordered in the ED ?Medications  ?albuterol (VENTOLIN HFA) 108 (90 Base) MCG/ACT inhaler 2 puff (2 puffs Inhalation Given 08/30/21 0645)  ?methylPREDNISolone sodium succinate (SOLU-MEDROL) 125 mg/2 mL injection 125 mg (has no administration in time range)  ? ? ?Initial Impression and Plan ?Patient with known COPD has had worsening breathing over the last several days, tested positive for Covid tonight, more hypoxic than baseline. Will check labs, CXR and monitor in the ED. Currently in afib on cardiac monitor. ? ?ED Course  ? ?Clinical Course as of 08/30/21 0711  ?Mon Aug 30, 2021  ?Sep 01, 2021 I personally viewed the images from radiology studies and agree with radiologist interpretation: CXR without acute process.  ? [CS]  ?0710 Care of the patient signed out to oncoming team at the change of shift.  [CS]  ?  ?Clinical Course User Index ?[CS] 7026, MD  ? ? ? ?MDM Rules/Calculators/A&P ?Medical Decision Making ?Problems Addressed: ?Acute on chronic respiratory failure with hypoxia  (HCC): acute illness or injury ?COPD exacerbation (HCC): chronic illness or injury ? ?Amount and/or Complexity of Data Reviewed ?Labs: ordered. Decision-making details documented in ED Course. ?Radiology: ordered and independent interpretation performed. Decision-making details documented in ED Course. ? ?Risk ?Prescription drug management. ? ? ? ?Final Clinical Impression(s) / ED Diagnoses ?Final diagnoses:  ?Acute on chronic respiratory failure with hypoxia (HCC)  ?COPD exacerbation (HCC)  ? ? ?Rx / DC Orders ?ED Discharge Orders   ? ? None  ? ?  ? ?  ?Pollyann Savoy, MD ?08/30/21 [phone] ? ?

## 2021-08-30 NOTE — ED Notes (Signed)
Patient transported to nuc med

## 2021-08-30 NOTE — Progress Notes (Signed)
Civil engineer, contracting [facility]) Hospital Liaison Note ? ?This is a pending [facility] hospice patient. We will follow through hospitalization for coordination of hospice at discharge.  ? ?Please call with any questions or concerns. Thank you ? ? ?Dionicio Stall, LCSW ?[facility] Hospital Liaison ?[phone] ?

## 2021-08-30 NOTE — ED Provider Notes (Signed)
Patient signed out to me by previous provider. Please refer to their note for full HPI.  Briefly this is a 88 year old female who presents to the emergency department with shortness of breath, hypoxia.  Found to be COVID-positive.  We are pending blood work for rule out sepsis, other acute findings.  Patient was scheduled for hospice evaluation today but I anticipate admission given hypoxia. ?Physical Exam  ?BP 97/64   Pulse 98   Temp 98.7 ?F (37.1 ?C) (Oral)   Resp 20   Ht 5\' 8"  (1.727 m)   Wt 65 kg   SpO2 96%   BMI 21.79 kg/m?  ? ?Physical Exam ?Vitals and nursing note reviewed.  ?Constitutional:   ?   General: She is not in acute distress. ?   Appearance: Normal appearance. She is not diaphoretic.  ?HENT:  ?   Head: Normocephalic.  ?   Mouth/Throat:  ?   Mouth: Mucous membranes are moist.  ?Cardiovascular:  ?   Rate and Rhythm: Normal rate.  ?Pulmonary:  ?   Effort: Pulmonary effort is normal. No respiratory distress.  ?Abdominal:  ?   Palpations: Abdomen is soft.  ?   Tenderness: There is no abdominal tenderness.  ?Musculoskeletal:     ?   General: No swelling.  ?Skin: ?   General: Skin is warm.  ?Neurological:  ?   Mental Status: She is alert and oriented to person, place, and time. Mental status is at baseline.  ?Psychiatric:     ?   Mood and Affect: Mood normal.  ? ? ?Procedures  ? Critical Care ?Performed by: Marland Kitchen, DO ?Authorized by: Rozelle Logan, DO  ? ?Critical care provider statement:  ?  Critical care time (minutes):  30 ?  Critical care time was exclusive of:  Separately billable procedures and treating other patients ?  Critical care was necessary to treat or prevent imminent or life-threatening deterioration of the following conditions:  Respiratory failure and circulatory failure ?  Critical care was time spent personally by me on the following activities:  Development of treatment plan with patient or surrogate, discussions with consultants, evaluation of patient's response  to treatment, examination of patient, ordering and review of laboratory studies, ordering and review of radiographic studies, ordering and performing treatments and interventions, pulse oximetry, re-evaluation of patient's condition and review of old charts ?  I assumed direction of critical care for this patient from another provider in my specialty: no   ?  Care discussed with: admitting provider   ? ?ED Course / MDM  ? ?Clinical Course as of 08/30/21 1214  ?Mon Aug 30, 2021  ?Sep 01, 2021 I personally viewed the images from radiology studies and agree with radiologist interpretation: CXR without acute process.  ? [CS]  ?0710 Care of the patient signed out to oncoming team at the change of shift.  [CS]  ?  ?Clinical Course User Index ?[CS] 1937, MD  ? ?Medical Decision Making ?Amount and/or Complexity of Data Reviewed ?Labs: ordered. ?Radiology: ordered. ? ?Risk ?Prescription drug management. ?Decision regarding hospitalization. ? ? ?Blood work shows no signs of sepsis/septic shock.  Patient did briefly become hypotensive which was fluid responsive, she is now back to her baseline blood pressure which the daughter reports is around 90 systolic.  Patient is awake, no respiratory distress, still requiring 3 L nasal cannula.  Patient is intermittently tachycardic, concern for possible PE.  She has a CT dye allergy. ? ?Spoke with hospitalist, plan for attempted  dimer rule out if not nuclear medicine is here for further evaluation.  She will be admitted medically, anticipate hospice evaluation.  Patients evaluation and results requires admission for further treatment and care.  Spoke with hospitalist Dr. Mariea Clonts, reviewed patient's ED course and they accept admission.  Patient agrees with admission plan, offers no new complaints and is stable/unchanged at time of admit. ? ? ? ? ?  ?Rozelle Logan, DO ?08/30/21 1216 ? ?

## 2021-08-30 NOTE — ED Triage Notes (Signed)
Pt from [facility] brought in by EMS after staff noticed pt was having breathing difficulties. Pt was tested @ facility for Covid and was positive. EMS states pt's O2 sats on R/A were 83-85% so pt was placed on 15L NRB and sats up to 96%. EMS states pt was also hypotensive at 92/54.  ?

## 2021-08-30 NOTE — ED Triage Notes (Signed)
Pt arrived via [facility] from [facility] with complaints of SOB. EMS states that pt was 85% on room air, placed on nonrebreather and is now 100%. Pt said to be Covid positive per facility.  ?

## 2021-08-30 NOTE — ED Notes (Signed)
MD made aware of pts BP 74/46. New orders placed  ?

## 2021-08-30 NOTE — H&P (Signed)
?                                                                                           ? ? ? ? Patient Demographics:  ? ? ?Deborah [name], is a 88 y.o. female  MRN: [id number]   DOB - 1925-10-30 ? ?Admit Date - 08/30/2021 ? ?Outpatient Primary MD for the patient is Marlowe AschoffPolite, Latrice, FNP ? ? Assessment & Plan:  ? ?Assessment and Plan: ? ?1)Acute hypoxic respiratory failure secondary to COVID-19 infection/Pneumonia--- The treatment plan and use of medications  for treatment of COVID-19 infection and possible side effects were discussed with patient and daughter  ?-----Patient/Family verbalizes understanding and agrees to treatment protocols   ?--Patient is positive for COVID-19 infection,  patient is tachypneic/hypoxic and requiring continuous supplemental oxygen---patient meets criteria for initiation of Paxlovid AND Steroid therapy per protocol  ?--Supplemental oxygen to keep O2 sats above 93% ?--Attempt to maintain euvolemic state ?--Zinc and vitamin C as ordered ?-Albuterol inhaler as needed ? ?2)Lt Sided PE--patient with prior history of PE, CTA from February 2021 reviewed ?-VQ scan on 08/30/2021 suggestive of left-sided PE ?-Risk versus benefit of full anticoagulation discussed with patient and patient's daughter-- Ms Lonn GeorgiaSusan [name], given symptomatic anemia requiring transfusions over the last couple of months including transfusion couple weeks ago-- ?-Patient also has history of prior retroperitoneal hemorrhage ?--- Patient's daughter would like to hold off on anticoagulation due to concerns about very very high risk of life-threatening bleeding ?-Plan will be for transition to hospice as outlined below ? ?3)Social/Ethics-- Discussed with daughter Ms Lonn GeorgiaSusan [name], [facility]--- ?-Patient is a DNR/DNI ?-Tentatively planning [facility] under hospice over the next day or 2 once  arrangements can be made ? ?4)Dementia with behavioral disturbance--- medications adjusted ? ?5)Chronic hypotension---c/n Midodrine,  BP should improve with steroids ? ?6) chronic A-fib/dCHF/HFpEF/severe pulmonary hypertension /asthma/CAD-----medications adjusted ? ?7) possible UTI--- --UA suggestive of UTI ?-Rocephin as ordered ? ?8) chronic anemia--- required transfusion multiple times over the last couple months last transfusion was a couple weeks ago ?-Hgb stable at 9.3 at this time ? ?Disposition/Need for in-Hospital Stay- patient unable to be discharged at this time due to --- COVID-19 infection with hypoxia and acute PE with hypoxia ?-IV steroids treatment oxygen ?-Possible transition to hospice in 24 to 48 hours ? ?Status is: Inpatient ? ?Remains inpatient appropriate because:  ? ?Dispo: The patient is from: ALF ?             Anticipated d/c is to: ALF ?             Anticipated d/c date is: 2 days ?             Patient currently is not medically stable to d/c. ?Barriers: Not Clinically Stable-  ? ? ?With History of - ?Reviewed by me ? ?Past Medical History:  ?Diagnosis Date  ? Alzheimer disease (HCC)   ? Asthma   ? Atrial fibrillation (HCC)   ? Not anticoagulated with history of bleeding and fall risk  ? Chronic diastolic heart failure (HCC)   ?  COPD (chronic obstructive pulmonary disease) (HCC)   ? Coronary atherosclerosis of native coronary artery   ? BMS circumflex 2009  ? Dementia without behavioral disturbance (HCC)   ? Drug intolerance   ? Flecacinide and Amiodarone   ? Fracture of multiple pubic rami (HCC) 12/13  ? Left superior and inferior - following fall  ? Hypotension   ? Mixed hyperlipidemia   ? Osteoarthritis   ? Retroperitoneal hemorrhage 12/13  ? Following fall  ? Seizures (HCC)   ? Remote history of over 20 years ago  ? Umbilical hernia   ?   ? ?Past Surgical History:  ?Procedure Laterality Date  ? CATARACT EXTRACTION    ? ESOPHAGOGASTRODUODENOSCOPY (EGD) WITH PROPOFOL N/A 08/18/2021  ?  Procedure: ESOPHAGOGASTRODUODENOSCOPY (EGD) WITH PROPOFOL;  Surgeon: Dolores Frame, MD;  Location: [facility];  Service: Gastroenterology;  Laterality: N/A;  ? Radiofrequency catheter ablation    ? Failed  ? Right carpel tunnel release    ? TONSILLECTOMY    ? ? ? ? ?Chief Complaint  ?Patient presents with  ? Respiratory Distress  ?  ? ? HPI:  ? ? Deborah [name]  is a 88 y.o. female past medical history relevant for advanced dementia, history of prior PE, symptomatic anemia requiring recurrent transfusion, history of retroperitoneal hemorrhage, asthma, CAD, chronic atrial fibrillation, history of dCHF and severe pulmonary hypertension presents from [facility] with concerns about worsening respiratory status and worsening hypoxia-- ?-Patient is a poor historian, additional history obtained from [facility] palliative care /hospice team and patient's daughter  Ms Lonn Georgia ?-In the ED patient is found to be hypoxic tachycardic and positive for COVID-19 infection ?-D-dimer is elevated and VQ scan is suggestive of acute PE ?-Chest x-ray without frank opacities ?COVID-19 Labs ? ?Recent Labs  ?  08/30/21 ?0651  ?DDIMER 2.07*  ? ? ?Lab Results  ?Component Value Date  ? SARSCOV2NAA POSITIVE (A) 08/30/2021  ? SARSCOV2NAA NEGATIVE 08/16/2021  ? SARSCOV2NAA NEGATIVE 07/03/2021  ? SARSCOV2NAA NEGATIVE 09/01/2020  ? ?-Hemoglobin is 9.3 WBC 8.1 platelets 217 ?--UA suggestive of UTI ?-Creatinine is 0.87 ?-LFTs are not elevated ? Review of systems:  ?  ?In addition to the HPI above,  ? ?A full Review of  Systems was done, all other systems reviewed are negative except as noted above in HPI , . ? ? ? Social History:  ?Reviewed by me ? ?  ?Social History  ? ?Tobacco Use  ? Smoking status: Former  ?  Packs/day: 0.80  ?  Years: 25.00  ?  Pack years: 20.00  ?  Types: Cigarettes  ?  Quit date: 05/23/1990  ?  Years since quitting: 31.2  ? Smokeless tobacco: Never  ?Substance Use Topics  ? Alcohol use: No  ?  Alcohol/week: 0.0  standard drinks  ? ? ? Family History :  ?Reviewed by me ? ?  ?Family History  ?Problem Relation Age of Onset  ? Coronary artery disease Mother   ? Tuberculosis Paternal Grandfather   ? Heart attack Maternal Grandmother   ? ? ? Home Medications:  ? ?Prior to Admission medications   ?Medication Sig Start Date End Date Taking? Authorizing Provider  ?albuterol (PROVENTIL) (2.5 MG/3ML) 0.083% nebulizer solution Take 3 mLs (2.5 mg total) by nebulization every 4 (four) hours as needed for wheezing or shortness of breath. 07/07/21 07/07/22 Yes Erick Blinks, MD  ?ALPRAZolam [name]) 0.25 MG tablet Take 1 tablet (0.25 mg total) by mouth every 8 (eight) hours as needed  for anxiety. 08/19/21  Yes Johnson, Clanford L, MD  ?D-5000 125 MCG (5000 UT) TABS Take 5,000 Units by mouth daily. 11/11/20  Yes [provider]  ?digoxin (LANOXIN) 0.125 MG tablet Take 1 tablet (0.125 mg total) by mouth every evening. 09/21/16  Yes [name], [name], MD  ?docusate sodium (COLACE) 100 MG capsule Take 100 mg by mouth at bedtime.   Yes [provider]  ?famotidine (PEPCID) 20 MG tablet Take 1 tablet (20 mg total) by mouth at bedtime. 09/02/20  Yes [name], MD  ?ferrous sulfate 325 (65 FE) MG tablet Take 325 mg by mouth daily with breakfast.   Yes [provider]  ?fluticasone (FLONASE) 50 MCG/ACT nasal spray Place 2 sprays into both nostrils daily.   Yes [provider]  ?furosemide (LASIX) 20 MG tablet Take 20 mg by mouth daily.   Yes [provider]  ?levETIRAcetam (KEPPRA) 250 MG tablet Take 250 mg by mouth daily.   Yes [provider]  ?levothyroxine (SYNTHROID, LEVOTHROID) 25 MCG tablet Take 25 mcg by mouth daily before breakfast.   Yes [provider]  ?melatonin 5 MG TABS Take 5 mg by mouth at bedtime.   Yes [provider]  ?midodrine (PROAMATINE) 2.5 MG tablet Take 1 tablet (2.5 mg total) by mouth 2 (two) times daily with a meal. The dosing can be  titrated down to once daily when her systolic blood pressure is consistently in the 100s. 05/04/12  Yes [name], MD  ?montelukast (SINGULAIR) 10 MG tablet Take 10 mg by mouth daily.  09/13/17  Yes Provider, Histori

## 2021-08-31 ENCOUNTER — Inpatient Hospital Stay (HOSPITAL_COMMUNITY): Payer: Medicare Other

## 2021-08-31 DIAGNOSIS — J9602 Acute respiratory failure with hypercapnia: Secondary | ICD-10-CM

## 2021-08-31 DIAGNOSIS — U071 COVID-19: Secondary | ICD-10-CM | POA: Diagnosis not present

## 2021-08-31 DIAGNOSIS — J9601 Acute respiratory failure with hypoxia: Secondary | ICD-10-CM

## 2021-08-31 LAB — BLOOD GAS, ARTERIAL
Acid-base deficit: 1.4 mmol/L (ref 0.0–2.0)
Bicarbonate: 26.9 mmol/L (ref 20.0–28.0)
Drawn by: 35043
FIO2: 36 %
O2 Saturation: 96.6 %
Patient temperature: 37
pCO2 arterial: 60 mmHg — ABNORMAL HIGH (ref 32–48)
pH, Arterial: 7.26 — ABNORMAL LOW (ref 7.35–7.45)
pO2, Arterial: 84 mmHg (ref 83–108)

## 2021-08-31 LAB — CBC
HCT: 20.6 % — ABNORMAL LOW (ref 36.0–46.0)
Hemoglobin: 6.5 g/dL — CL (ref 12.0–15.0)
MCH: 32.5 pg (ref 26.0–34.0)
MCHC: 31.6 g/dL (ref 30.0–36.0)
MCV: 103 fL — ABNORMAL HIGH (ref 80.0–100.0)
Platelets: 346 10*3/uL (ref 150–400)
RBC: 2 MIL/uL — ABNORMAL LOW (ref 3.87–5.11)
RDW: 21.2 % — ABNORMAL HIGH (ref 11.5–15.5)
WBC: 11.1 10*3/uL — ABNORMAL HIGH (ref 4.0–10.5)
nRBC: 0 % (ref 0.0–0.2)

## 2021-08-31 LAB — GLUCOSE, CAPILLARY
Glucose-Capillary: 88 mg/dL (ref 70–99)
Glucose-Capillary: 91 mg/dL (ref 70–99)

## 2021-08-31 LAB — BASIC METABOLIC PANEL
Anion gap: 11 (ref 5–15)
BUN: 27 mg/dL — ABNORMAL HIGH (ref 8–23)
CO2: 25 mmol/L (ref 22–32)
Calcium: 8.4 mg/dL — ABNORMAL LOW (ref 8.9–10.3)
Chloride: 104 mmol/L (ref 98–111)
Creatinine, Ser: 0.73 mg/dL (ref 0.44–1.00)
GFR, Estimated: >60 mL/min (ref >60)
Glucose, Bld: 105 mg/dL — ABNORMAL HIGH (ref 70–99)
Potassium: 4 mmol/L (ref 3.5–5.1)
Sodium: 140 mmol/L (ref 135–145)

## 2021-08-31 MED ORDER — LORAZEPAM 2 MG/ML IJ SOLN: 1.0000 mg | INTRAMUSCULAR | Status: DC | PRN

## 2021-08-31 MED ORDER — ALBUTEROL SULFATE HFA 108 (90 BASE) MCG/ACT IN AERS: 2.0000 | INHALATION_SPRAY | RESPIRATORY_TRACT | 1 refills | Status: AC | PRN

## 2021-08-31 MED ORDER — POLYVINYL ALCOHOL 1.4 % OP SOLN: 1.0000 [drp] | Freq: Four times a day (QID) | OPHTHALMIC | 0 refills | Status: AC | PRN

## 2021-08-31 MED ORDER — SCOPOLAMINE 1 MG/3DAYS TD PT72: 1.0000 | MEDICATED_PATCH | TRANSDERMAL | 0 refills | Status: AC

## 2021-08-31 MED ORDER — SODIUM CHLORIDE 0.9% FLUSH: 3.0000 mL | INTRAVENOUS | Status: DC | PRN

## 2021-08-31 MED ORDER — SODIUM CHLORIDE 0.9 % IV SOLN: 250.0000 mL | INTRAVENOUS | Status: DC | PRN

## 2021-08-31 MED ORDER — SCOPOLAMINE 1 MG/3DAYS TD PT72
1.0000 | MEDICATED_PATCH | TRANSDERMAL | Status: DC
  Administered 2021-08-31: 1.5 mg via TRANSDERMAL
  Filled 2021-08-31: qty 1

## 2021-08-31 MED ORDER — DIPHENHYDRAMINE HCL 50 MG/ML IJ SOLN
12.5000 mg | INTRAMUSCULAR | Status: DC | PRN
  Administered 2021-08-31 – 2021-09-01 (×2): 12.5 mg via INTRAVENOUS
  Filled 2021-08-31 (×2): qty 1

## 2021-08-31 MED ORDER — ONDANSETRON 4 MG PO TBDP: 4.0000 mg | ORAL_TABLET | Freq: Four times a day (QID) | ORAL | 0 refills | Status: AC | PRN

## 2021-08-31 MED ORDER — HYDROCOD POLI-CHLORPHE POLI ER 10-8 MG/5ML PO SUER: 5.0000 mL | Freq: Two times a day (BID) | ORAL | 0 refills | Status: AC | PRN

## 2021-08-31 MED ORDER — ACETAMINOPHEN 650 MG RE SUPP: 650.0000 mg | Freq: Four times a day (QID) | RECTAL | 0 refills | Status: AC | PRN

## 2021-08-31 MED ORDER — GLYCOPYRROLATE 0.2 MG/ML IJ SOLN
0.2000 mg | INTRAMUSCULAR | Status: DC | PRN
  Administered 2021-08-31: 0.2 mg via INTRAVENOUS
  Filled 2021-08-31: qty 1

## 2021-08-31 MED ORDER — MORPHINE SULFATE (PF) 2 MG/ML IV SOLN
2.0000 mg | INTRAVENOUS | Status: DC | PRN
  Administered 2021-08-31 – 2021-09-01 (×5): 2 mg via INTRAVENOUS
  Filled 2021-08-31 (×5): qty 1

## 2021-08-31 MED ORDER — GUAIFENESIN-DM 100-10 MG/5ML PO SYRP: 10.0000 mL | ORAL_SOLUTION | ORAL | 0 refills | Status: AC | PRN

## 2021-08-31 MED ORDER — SODIUM CHLORIDE 0.9 % IV SOLN
1.0000 g | INTRAVENOUS | Status: DC
  Filled 2021-08-31: qty 10

## 2021-08-31 MED ORDER — BIOTENE DRY MOUTH MT LIQD: 15.0000 mL | OROMUCOSAL | Status: DC | PRN

## 2021-08-31 MED ORDER — GLYCOPYRROLATE 1 MG PO TABS
1.0000 mg | ORAL_TABLET | ORAL | Status: DC | PRN
Start: 2021-08-31 — End: 2021-09-01

## 2021-08-31 MED ORDER — POLYVINYL ALCOHOL 1.4 % OP SOLN: 1.0000 [drp] | Freq: Four times a day (QID) | OPHTHALMIC | Status: DC | PRN

## 2021-08-31 MED ORDER — ONDANSETRON HCL 4 MG/2ML IJ SOLN: 4.0000 mg | Freq: Four times a day (QID) | INTRAMUSCULAR | Status: DC | PRN

## 2021-08-31 MED ORDER — SODIUM CHLORIDE 0.9% FLUSH
3.0000 mL | Freq: Two times a day (BID) | INTRAVENOUS | Status: DC
  Administered 2021-08-31: 3 mL via INTRAVENOUS

## 2021-08-31 MED ORDER — ONDANSETRON 4 MG PO TBDP: 4.0000 mg | ORAL_TABLET | Freq: Four times a day (QID) | ORAL | Status: DC | PRN

## 2021-08-31 MED ORDER — ALBUTEROL SULFATE (2.5 MG/3ML) 0.083% IN NEBU: 2.5000 mg | INHALATION_SOLUTION | RESPIRATORY_TRACT | Status: DC | PRN

## 2021-08-31 MED ORDER — LORAZEPAM 2 MG/ML PO CONC: 1.0000 mg | ORAL | Status: DC | PRN

## 2021-08-31 MED ORDER — LORAZEPAM 2 MG/ML IJ SOLN
0.5000 mg | INTRAMUSCULAR | Status: DC | PRN
  Administered 2021-09-01 (×2): 0.5 mg via INTRAVENOUS
  Filled 2021-08-31 (×2): qty 1

## 2021-08-31 MED ORDER — LORAZEPAM 1 MG PO TABS: 1.0000 mg | ORAL_TABLET | ORAL | Status: DC | PRN

## 2021-08-31 MED ORDER — LORAZEPAM 2 MG/ML PO CONC: 1.0000 mg | Freq: Three times a day (TID) | ORAL | 0 refills | Status: AC | PRN

## 2021-08-31 MED ORDER — GLYCOPYRROLATE 0.2 MG/ML IJ SOLN: 0.2000 mg | INTRAMUSCULAR | Status: DC | PRN

## 2021-08-31 MED ORDER — MORPHINE SULFATE 20 MG/5ML PO SOLN: 5.0000 mg | ORAL | 0 refills | Status: AC | PRN

## 2021-08-31 NOTE — Progress Notes (Signed)
RN called due to patient having a respiratory rate of about 35/minute.  ABG was ordered, at bedside, patient was noted with tachycardia and unable coarse breath sounds without stethoscope.  She was somnolent, though arousable, but quickly goes back to sleep. ?Apparently, patient was reported to have taken off her nasal cannula for about 20 minutes, when RN checked on the patient, she was noted with increased respiratory effort and increased work of breathing, nasal cannula was quickly placed and over time, respiratory rate decreases back to normal. ?Arterial Blood Gas result:  pO2 84; pCO2 60; pH 7.26;  HCO3 26.9, %O2 Sat 36%. ?Chest x-ray ordered-pending ?Vital signs after patient's supplemental oxygen was restored: ? ?BP (!) 108/52 (BP Location: Left Arm)   Pulse 87   Temp 97.8 ?F (36.6 ?C) (Oral)   Resp 17   Ht 5\' 8"  (1.727 m)   Wt 65 kg   SpO2 100%   BMI 21.79 kg/m?  ? ?We shall continue to monitor patient and treat accordingly. ? ?Total time:  10 minutes ?This includes time reviewing the chart including progress notes, labs, EKGs, taking medical decisions, ordering labs and documenting findings. ? ?

## 2021-08-31 NOTE — NC FL2 (Signed)
?[place] MEDICAID FL2 LEVEL OF CARE SCREENING TOOL  ?  ? ?IDENTIFICATION  ?Patient Name: ?Deborah [name] Birthdate: 1925/08/29 Sex: female Admission Date (Current Location): ?08/30/2021  ?[place] and [place] Number: ? [place] and Address:  ?[facility] [address], [place] ?     Provider Number: ?[id number]  ?Attending Physician Name and Address:  ?Roxan Hockey, MD ? Relative Name and Phone Number:  ?  ?   ?Current Level of Care: ?Hospital Recommended Level of Care: ?Assisted Living Facility Prior Approval Number: ?  ? ?Date Approved/Denied: ?  PASRR Number: ?  ? ?Discharge Plan: ?Domiciliary (Rest home) ?  ? ?Current Diagnoses: ?Patient Active Problem List  ? Diagnosis Date Noted  ? Acute pulmonary embolism ([place]) 08/30/2021  ? Iron deficiency anemia due to chronic blood loss   ? SOB (shortness of breath)   ? Elevated brain natriuretic peptide (BNP) level 07/04/2021  ? Elevated MCV 07/04/2021  ? Thrombocytosis 07/04/2021  ? Acute respiratory failure with hypoxia ([place]) 07/04/2021  ? Hypothyroidism 07/04/2021  ? GERD (gastroesophageal reflux disease) 07/04/2021  ? Major neurocognitive disorder ([place]) 07/04/2021  ? Symptomatic anemia 07/04/2021  ? Acute metabolic encephalopathy 0000000  ? Generalized weakness 09/01/2020  ? Hypercalcemia 09/01/2020  ? Dehydration 09/01/2020  ? Essential hypertension 09/01/2020  ? Acute pulmonary embolism without acute cor pulmonale ([place]) 07/20/2019  ? Sepsis due to undetermined organism ([place]) 07/18/2019  ? AKI (acute kidney injury) ([place]) 07/18/2019  ? Acute cystitis without hematuria   ? Sepsis ([place]) 07/17/2019  ? COVID-19 virus infection 07/17/2019  ? Chest pain 09/02/2017  ? Compression fracture of body of thoracic vertebra ([place]) 06/22/2017  ? Atrial fibrillation with rapid ventricular response ([place])   ? Screening for colorectal cancer 03/24/2017  ? Hemorrhoids 03/24/2017  ? Itching in the vaginal area 03/24/2017  ? Vulval thrush  03/24/2017  ? Hypoxemia   ? Alzheimer disease ([place]) 09/13/2016  ? Seizures ([place]) 09/13/2016  ? Hypokalemia 09/13/2016  ? COPD exacerbation ([place]) 09/13/2016  ? Hypotension 04/22/2012  ? CAD S/P percutaneous coronary angioplasty   ? COPD (chronic obstructive pulmonary disease) ([place])   ? Permanent atrial fibrillation ([place]) 02/10/2009  ? Acute on chronic diastolic CHF (congestive heart failure) ([place]) 02/10/2009  ? ? ?Orientation RESPIRATION BLADDER Height & Weight   ?  ?Self ? O2 (4L) Incontinent Weight: 143 lb 4.8 oz (65 kg) ?Height:  5\' 8"  (172.7 cm)  ?BEHAVIORAL SYMPTOMS/MOOD NEUROLOGICAL BOWEL NUTRITION STATUS  ?    Incontinent Diet  ?AMBULATORY STATUS COMMUNICATION OF NEEDS Skin   ?Total Care Verbally PU Stage and Appropriate Care ([place]) ?  ?  ?  ?    ?     ?     ? ? ?Personal Care Assistance Level of Assistance  ?  Bathing Assistance: Maximum assistance ?Feeding assistance: Maximum assistance ?Dressing Assistance: Maximum assistance ?   ? ?Functional Limitations Info  ?    ?  ?   ? ? ?SPECIAL CARE FACTORS FREQUENCY  ?    ?  ?  ?  ?  ?  ?  ?   ? ? ?Contractures Contractures Info: Not present  ? ? ?Additional Factors Info  ?  Code Status Info: DNR ?Allergies Info: ivp dye, omnipaque, sulfa antibiotics, amiodarone, carbamazepine, dilaudid, tramadol ?Psychotropic Info: Zoloft, Xanax, Ativan ?  ?  ?   ? ?Current Medications (08/31/2021):  This is the current hospital active medication list ?Current Facility-Administered Medications  ?Medication  Dose Route Frequency Provider Last Rate Last Admin  ? 0.9 %  sodium chloride infusion  250 mL Intravenous PRN Emokpae, Courage, MD      ? 0.9 %  sodium chloride infusion  250 mL Intravenous PRN Emokpae, Courage, MD      ? acetaminophen (TYLENOL) tablet 650 mg  650 mg Oral Q6H PRN Emokpae, Courage, MD      ? Or  ? acetaminophen (TYLENOL) suppository 650 mg  650 mg Rectal Q6H PRN Emokpae, Courage, MD      ? albuterol (PROVENTIL) (2.5 MG/3ML) 0.083% nebulizer solution 2.5 mg  2.5  mg Nebulization Q2H PRN Emokpae, Courage, MD      ? albuterol (VENTOLIN HFA) 108 (90 Base) MCG/ACT inhaler 2 puff  2 puff Inhalation Q2H PRN Shon Hale, MD   2 puff at 08/31/21 0353  ? ALPRAZolam [name]) tablet 0.5 mg  0.5 mg Oral BID Shon Hale, MD   0.5 mg at 08/30/21 2120  ? antiseptic oral rinse (BIOTENE) solution 15 mL  15 mL Topical PRN Emokpae, Courage, MD      ? bisacodyl (DULCOLAX) suppository 10 mg  10 mg Rectal Daily PRN Emokpae, Courage, MD      ? cefTRIAXone (ROCEPHIN) 1 g in sodium chloride 0.9 % 100 mL IVPB  1 g Intravenous Q24H Emokpae, Courage, MD      ? chlorpheniramine-HYDROcodone 10-8 MG/5ML suspension 5 mL  5 mL Oral Q12H PRN Emokpae, Courage, MD      ? diphenhydrAMINE (BENADRYL) injection 12.5 mg  12.5 mg Intravenous Q4H PRN Emokpae, Courage, MD      ? fluticasone furoate-vilanterol (BREO ELLIPTA) 100-25 MCG/ACT 1 puff  1 puff Inhalation Daily Shon Hale, MD   1 puff at 08/31/21 0744  ? And  ? umeclidinium bromide (INCRUSE ELLIPTA) 62.5 MCG/ACT 1 puff  1 puff Inhalation Daily Shon Hale, MD   1 puff at 08/31/21 0743  ? glycopyrrolate (ROBINUL) tablet 1 mg  1 mg Oral Q4H PRN Emokpae, Courage, MD      ? Or  ? glycopyrrolate (ROBINUL) injection 0.2 mg  0.2 mg Subcutaneous Q4H PRN Emokpae, Courage, MD      ? Or  ? glycopyrrolate (ROBINUL) injection 0.2 mg  0.2 mg Intravenous Q4H PRN Mariea Clonts, Courage, MD   0.2 mg at 08/31/21 1223  ? guaiFENesin-dextromethorphan (ROBITUSSIN DM) 100-10 MG/5ML syrup 10 mL  10 mL Oral Q4H PRN Emokpae, Courage, MD      ? LORazepam (ATIVAN) injection 0.5 mg  0.5 mg Intravenous Q4H PRN Emokpae, Courage, MD      ? morphine (PF) 2 MG/ML injection 2 mg  2 mg Intravenous Q2H PRN Shon Hale, MD   2 mg at 08/31/21 1223  ? ondansetron (ZOFRAN-ODT) disintegrating tablet 4 mg  4 mg Oral Q6H PRN Emokpae, Courage, MD      ? Or  ? ondansetron (ZOFRAN) injection 4 mg  4 mg Intravenous Q6H PRN Emokpae, Courage, MD      ? polyvinyl alcohol (LIQUIFILM TEARS)  1.4 % ophthalmic solution 1 drop  1 drop Both Eyes QID PRN Emokpae, Courage, MD      ? scopolamine (TRANSDERM-SCOP) 1 MG/3DAYS 1.5 mg  1 patch Transdermal Q72H Emokpae, Courage, MD      ? sodium chloride flush (NS) 0.9 % injection 3 mL  3 mL Intravenous Q12H Emokpae, Courage, MD   3 mL at 08/30/21 2121  ? sodium chloride flush (NS) 0.9 % injection 3 mL  3 mL Intravenous Q12H Shon Hale, MD  3 mL at 08/30/21 2121  ? sodium chloride flush (NS) 0.9 % injection 3 mL  3 mL Intravenous PRN Emokpae, Courage, MD      ? sodium chloride flush (NS) 0.9 % injection 3 mL  3 mL Intravenous Q12H Emokpae, Courage, MD      ? sodium chloride flush (NS) 0.9 % injection 3 mL  3 mL Intravenous PRN Emokpae, Courage, MD      ? umeclidinium-vilanterol (ANORO ELLIPTA) 62.5-25 MCG/ACT 1 puff  1 puff Inhalation Daily Roxan Hockey, MD   1 puff at 08/31/21 0743  ? ? ? ?Discharge Medications: ? ?STOP taking these medications   ?  ?albuterol (2.5 MG/3ML) 0.083% nebulizer solution ?Commonly known as: PROVENTIL ?Replaced by: albuterol 108 (90 Base) MCG/ACT inhaler ?   ?ALPRAZolam 0.25 MG tablet ?Commonly known as: Duanne Moron ?   ?D-5000 125 MCG (5000 UT) Tabs ?Generic drug: Cholecalciferol ?   ?digoxin 0.125 MG tablet ?Commonly known as: LANOXIN ?   ?docusate sodium 100 MG capsule ?Commonly known as: COLACE ?   ?famotidine 20 MG tablet ?Commonly known as: PEPCID ?   ?ferrous sulfate 325 (65 FE) MG tablet ?   ?fluticasone 50 MCG/ACT nasal spray ?Commonly known as: FLONASE ?   ?furosemide 20 MG tablet ?Commonly known as: LASIX ?   ?ICaps MV Tabs ?   ?levETIRAcetam 250 MG tablet ?Commonly known as: KEPPRA ?   ?levothyroxine 25 MCG tablet ?Commonly known as: SYNTHROID ?   ?melatonin 5 MG Tabs ?   ?midodrine 2.5 MG tablet ?Commonly known as: PROAMATINE ?   ?montelukast 10 MG tablet ?Commonly known as: SINGULAIR ?   ?pantoprazole 40 MG tablet ?Commonly known as: PROTONIX ?   ?potassium chloride 10 MEQ tablet ?Commonly known as: KLOR-CON ?    ?sertraline 25 MG tablet ?Commonly known as: ZOLOFT ?   ?Trelegy Ellipta 100-62.5-25 MCG/ACT Aepb ?Generic drug: Fluticasone-Umeclidin-Vilant ?   ?  ?   ?  ?TAKE these medications   ?  ?acetaminophen 650 MG suppo

## 2021-08-31 NOTE — Progress Notes (Signed)
?   08/31/21 0434  ?Assess: MEWS Score  ?Temp 97.8 ?F (36.6 ?C)  ?BP 106/63  ?Pulse Rate (!) 127  ?Resp (!) 30  ?SpO2 100 %  ?O2 Device Nasal Cannula  ?O2 Flow Rate (L/min) 4 L/min  ?Assess: MEWS Score  ?MEWS Temp 0  ?MEWS Systolic 0  ?MEWS Pulse 2  ?MEWS RR 2  ?MEWS LOC 0  ?MEWS Score 4  ?MEWS Score Color Red  ?Assess: if the MEWS score is Yellow or Red  ?Were vital signs taken at a resting state? Yes  ?Focused Assessment No change from prior assessment  ?Escalate  ?MEWS: Escalate Red: discuss with charge nurse/RN and provider, consider discussing with RRT  ?Notify: Charge Nurse/RN  ?Name of Charge Nurse/RN Notified Wilkie Aye RN  ?Date Charge Nurse/RN Notified 08/31/21  ?Time Charge Nurse/RN Notified [phone]  ? ? ?

## 2021-08-31 NOTE — TOC Transition Note (Signed)
Transition of Care (TOC) - CM/SW Discharge Note ? ? ?Patient Details  ?Name: Deborah [name] ?MRN: [id number] ?Date of Birth: [birth year] ? ?Transition of Care (TOC) CM/SW Contact:  ?Elliot Gault, LCSW ?Phone Number: ?08/31/2021, 2:11 PM ? ? ?Clinical Narrative:    ? ?Pt from [facility]. Plan is for return to [facility] with Hospice services in place. Pt's daughter aware and in agreement with plan. Pt was previously scheduled to admit to [facility] services yesterday though had to come to the hospital so admission to hospice was delayed.  ? ?Have been coordinating pt's dc with [facility] all day. Plan is for dc to [facility] via EMS tomorrow AM once O2 delivered to [place]. MD updated on other requested orders for dc. DC clinical faxed to [facility].  ? ?Will follow up in AM. ? ?Expected Discharge Plan: Home w Hospice Care ?Barriers to Discharge: Barriers Resolved ? ? ?Patient Goals and CMS Choice ?Patient states their goals for this hospitalization and ongoing recovery are:: ALF with hospice ?CMS Medicare.gov Compare Post Acute Care list provided to:: Patient Represenative (must comment) ?Choice offered to / list presented to : Adult Children ? ?Expected Discharge Plan and Services ?Expected Discharge Plan: Home w Hospice Care ?In-house Referral: Clinical Social Work ?  ?Post Acute Care Choice: Hospice ?Living arrangements for the past 2 months: Assisted Living Facility ?                ?  ?  ?  ?  ?  ?  ?  ?  ?  ?  ? ?Prior Living Arrangements/Services ?Living arrangements for the past 2 months: Assisted Living Facility ?Lives with:: Facility Resident ?Patient language and need for interpreter reviewed:: Yes ?Do you feel safe going back to the place where you live?: Yes      ?Need for Family Participation in Patient Care: No (Comment) ?Care giver support system in place?: Yes (comment) ?Current home services: DME ?Criminal Activity/Legal Involvement Pertinent to Current Situation/Hospitalization:  No - Comment as needed ? ?Activities of Daily Living ?Home Assistive Devices/Equipment: Dan Humphreys (specify type) ?ADL Screening (condition at time of admission) ?Patient's cognitive ability adequate to safely complete daily activities?: No ?Is the patient deaf or have difficulty hearing?: No ?Does the patient have difficulty seeing, even when wearing glasses/contacts?: No ?Does the patient have difficulty concentrating, remembering, or making decisions?: Yes ?Patient able to express need for assistance with ADLs?: Yes ?Does the patient have difficulty dressing or bathing?: Yes ?Independently performs ADLs?: No ?Communication: Independent ?Dressing (OT): Needs assistance ?Is this a change from baseline?: Pre-admission baseline ?Grooming: Needs assistance ?Is this a change from baseline?: Pre-admission baseline ?Feeding: Independent ?Bathing: Needs assistance ?Is this a change from baseline?: Pre-admission baseline ?Toileting: Needs assistance ?Is this a change from baseline?: Pre-admission baseline ?In/Out Bed: Needs assistance ?Is this a change from baseline?: Pre-admission baseline ?Walks in Home: Needs assistance ?Is this a change from baseline?: Pre-admission baseline ?Does the patient have difficulty walking or climbing stairs?: Yes ?Weakness of Legs: Both ?Weakness of Arms/Hands: Both ? ?Permission Sought/Granted ?Permission sought to share information with : Magazine features editor ?Permission granted to share information with : Yes, Verbal Permission Granted ?   ? Permission granted to share info w AGENCY: [facility] ?   ?   ? ?Emotional Assessment ?  ?  ?  ?  ?Alcohol / Substance Use: Not Applicable ?Psych Involvement: No (comment) ? ?Admission diagnosis:  COPD exacerbation (HCC) [J44.1] ?Acute on chronic respiratory failure with hypoxia (HCC) [  J96.21] ?COVID-19 virus infection [U07.1] ?Patient Active Problem List  ? Diagnosis Date Noted  ? Acute pulmonary embolism (HCC) 08/30/2021  ? Iron deficiency  anemia due to chronic blood loss   ? SOB (shortness of breath)   ? Elevated brain natriuretic peptide (BNP) level 07/04/2021  ? Elevated MCV 07/04/2021  ? Thrombocytosis 07/04/2021  ? Acute respiratory failure with hypoxia (HCC) 07/04/2021  ? Hypothyroidism 07/04/2021  ? GERD (gastroesophageal reflux disease) 07/04/2021  ? Major neurocognitive disorder (HCC) 07/04/2021  ? Symptomatic anemia 07/04/2021  ? Acute metabolic encephalopathy 09/01/2020  ? Generalized weakness 09/01/2020  ? Hypercalcemia 09/01/2020  ? Dehydration 09/01/2020  ? Essential hypertension 09/01/2020  ? Acute pulmonary embolism without acute cor pulmonale (HCC) 07/20/2019  ? Sepsis due to undetermined organism (HCC) 07/18/2019  ? AKI (acute kidney injury) (HCC) 07/18/2019  ? Acute cystitis without hematuria   ? Sepsis (HCC) 07/17/2019  ? COVID-19 virus infection 07/17/2019  ? Chest pain 09/02/2017  ? Compression fracture of body of thoracic vertebra (HCC) 06/22/2017  ? Atrial fibrillation with rapid ventricular response (HCC)   ? Screening for colorectal cancer 03/24/2017  ? Hemorrhoids 03/24/2017  ? Itching in the vaginal area 03/24/2017  ? Vulval thrush 03/24/2017  ? Hypoxemia   ? Alzheimer disease (HCC) 09/13/2016  ? Seizures (HCC) 09/13/2016  ? Hypokalemia 09/13/2016  ? COPD exacerbation (HCC) 09/13/2016  ? Hypotension 04/22/2012  ? CAD S/P percutaneous coronary angioplasty   ? COPD (chronic obstructive pulmonary disease) (HCC)   ? Permanent atrial fibrillation (HCC) 02/10/2009  ? Acute on chronic diastolic CHF (congestive heart failure) (HCC) 02/10/2009  ? ?PCP:  Marlowe Aschoff, FNP ?Pharmacy:   ?[facility] [place] [place] - [address] [facility] [place] ?[address] [facility] [place] [address] ?Phone: [phone] Fax: [phone] ? ? ? ? ?Social Determinants of Health (SDOH) Interventions ?  ? ?Readmission Risk Interventions ? ?  08/31/2021  ?  2:10 PM 08/16/2021  ? 10:38 AM 07/05/2021  ? 11:34 AM  ?Readmission Risk Prevention Plan   ?Transportation Screening Complete Complete Complete  ?HRI or Home Care Consult  Complete Complete  ?Social Work Consult for Recovery Care Planning/Counseling  Complete Complete  ?Palliative Care Screening  Not Applicable Not Applicable  ?Medication Review Oceanographer) Complete Complete Complete  ?HRI or Home Care Consult Complete    ?SW Recovery Care/Counseling Consult Complete    ?Palliative Care Screening Complete    ?Skilled Nursing Facility Not Applicable    ? ? ? ?Final next level of care: Home w Hospice Care ?Barriers to Discharge: Barriers Resolved ? ? ?Patient Goals and CMS Choice ?Patient states their goals for this hospitalization and ongoing recovery are:: ALF with hospice ?CMS Medicare.gov Compare Post Acute Care list provided to:: Patient Represenative (must comment) ?Choice offered to / list presented to : Adult Children ? ?Discharge Placement ?  ?           ?  ?  ?  ?  ? ?Discharge Plan and Services ?In-house Referral: Clinical Social Work ?  ?Post Acute Care Choice: Hospice          ?  ?  ?  ?  ?  ?  ?  ?  ?  ?  ? ?Social Determinants of Health (SDOH) Interventions ?  ? ? ?Readmission Risk Interventions ? ?  08/31/2021  ?  2:10 PM 08/16/2021  ? 10:38 AM 07/05/2021  ? 11:34 AM  ?Readmission Risk Prevention Plan  ?Transportation Screening Complete Complete Complete  ?  HRI or Home Care Consult  Complete Complete  ?Social Work Consult for Recovery Care Planning/Counseling  Complete Complete  ?Palliative Care Screening  Not Applicable Not Applicable  ?Medication Review Oceanographer(RN Care Manager) Complete Complete Complete  ?HRI or Home Care Consult Complete    ?SW Recovery Care/Counseling Consult Complete    ?Palliative Care Screening Complete    ?Skilled Nursing Facility Not Applicable    ? ? ? ? ? ?

## 2021-08-31 NOTE — Progress Notes (Signed)
Date and time results received: 08/31/21 0850 ? ?(use smartphrase ".now" to insert current time) ? ?Test: HGB ?Critical Value: 6.4 ? ?Name of Provider Notified: Shon Hale, MD ? ?Orders Received? Or Actions Taken?:  ?

## 2021-08-31 NOTE — Progress Notes (Signed)
?   08/31/21 0220  ?Assess: MEWS Score  ?Temp 98.6 ?F (37 ?C)  ?BP (!) 135/58  ?Pulse Rate (!) 104  ?Resp (!) 35 ?(counted manually to verify x2 reading)  ?SpO2 96 %  ?Assess: MEWS Score  ?MEWS Temp 0  ?MEWS Systolic 0  ?MEWS Pulse 1  ?MEWS RR 2  ?MEWS LOC 0  ?MEWS Score 3  ?MEWS Score Color Yellow  ?Escalate  ?MEWS: Escalate Yellow: discuss with charge nurse/RN and consider discussing with provider and RRT  ?Notify: Charge Nurse/RN  ?Name of Charge Nurse/RN Notified Wilkie Aye RN  ?Date Charge Nurse/RN Notified 08/31/21  ?Time Charge Nurse/RN Notified [phone]  ?Notify: Provider  ?Provider Name/Title A[facility]o  ?Date Provider Notified 08/31/21  ?Time Provider Notified [phone]  ?Notification Type Page  ?Notification Reason Change in status;Other (Comment) ?(Yellow mews)  ?Provider response At bedside  ?Date of Provider Response 08/31/21  ?Time of Provider Response 0405  ? ? ?

## 2021-08-31 NOTE — Discharge Summary (Signed)
?                                                                                ? ? ?Deborah [name], is a 88 y.o. female  DOB [birth year]  MRN [id number]. ? ?Admission date:  08/30/2021  Admitting Physician  Alcus Bradly Denton Brick, MD ? ?Discharge Date:  08/31/2021  ? ?Primary MD  Lynnell Catalan, FNP ? ?Recommendations for primary care physician for things to follow:  ? ?--Patient will be transition to hospice service and return to [facility] assisted living with full comfort care ? ?Admission Diagnosis  COPD exacerbation ([place]) [J44.1] ?Acute on chronic respiratory failure with hypoxia (HCC) [J96.21] ?COVID-19 virus infection [U07.1] ? ? ?Discharge Diagnosis  COPD exacerbation ([place]) [J44.1] ?Acute on chronic respiratory failure with hypoxia (HCC) [J96.21] ?COVID-19 virus infection [U07.1]   ? ?Principal Problem: ?  COVID-19 virus infection ?Active Problems: ?  Acute pulmonary embolism ([place]) ?  COPD (chronic obstructive pulmonary disease) ([place]) ?  Alzheimer disease ([place]) ?  Acute metabolic encephalopathy ?  Acute respiratory failure with hypoxia ([place]) ?  Iron deficiency anemia due to chronic blood loss ?    ? ?Past Medical History:  ?Diagnosis Date  ? Alzheimer disease ([place])   ? Asthma   ? Atrial fibrillation ([place])   ? Not anticoagulated with history of bleeding and fall risk  ? Chronic diastolic heart failure ([place])   ? COPD (chronic obstructive pulmonary disease) ([place])   ? Coronary atherosclerosis of native coronary artery   ? BMS circumflex 2009  ? Dementia without behavioral disturbance ([place])   ? Drug intolerance   ? Flecacinide and Amiodarone   ? Fracture of multiple pubic rami ([place]) 12/13  ? Left superior and inferior - following fall  ? Hypotension   ? Mixed hyperlipidemia   ? Osteoarthritis   ? Retroperitoneal hemorrhage 12/13  ? Following fall  ? Seizures ([place])   ? Remote history of over 20 years ago  ? Umbilical hernia   ? ? ?Past Surgical History:  ?Procedure Laterality Date  ? CATARACT EXTRACTION    ?  ESOPHAGOGASTRODUODENOSCOPY (EGD) WITH PROPOFOL N/A 08/18/2021  ? Procedure: ESOPHAGOGASTRODUODENOSCOPY (EGD) WITH PROPOFOL;  Surgeon: Harvel Quale, MD;  Location: [facility];  Service: Gastroenterology;  Laterality: N/A;  ? Radiofrequency catheter ablation    ? Failed  ? Right carpel tunnel release    ? TONSILLECTOMY    ? ? ? HPI  from the history and physical done on the day of admission:  ? ?Deborah [name]  is a 88 y.o. female past medical history relevant for advanced dementia, history of prior PE, symptomatic anemia requiring recurrent transfusion, history of retroperitoneal hemorrhage, asthma, CAD, chronic atrial fibrillation, history of dCHF and severe pulmonary hypertension presents from [facility] with concerns about worsening respiratory status and worsening hypoxia-- ?-Patient is a poor historian, additional history obtained from [facility] palliative care /hospice team and patient's daughter  Ms Susa Raring ?-In the ED patient is found to be hypoxic tachycardic and positive for COVID-19 infection ?-D-dimer is elevated and VQ scan is suggestive of acute PE ?-Chest x-ray without frank opacities ?- ?--Hemoglobin is 9.3 WBC 8.1 platelets 217 ?--UA suggestive  of UTI ?-Creatinine is 0.87 ?-LFTs are not elevated ? ? ? Hospital Course:  ? ? ?Assessment and Plan: ?1)Acute hypoxic respiratory failure secondary to COVID-19 infection and acute pulmonary embolism ---  ?-Patient's hypoxia has worsened, respiratory status and overall condition worsened ?-After further conversations with patient's daughter and the hospice team ?--Patient will be transition to hospice service and return to [facility] assisted living with full comfort care ?-----Albuterol inhaler as needed ?-Supplemental oxygen as needed ?-Morphine and lorazepam as needed ?  ?2)Lt Sided PE--patient with prior history of PE,  ?-Prior CTA from February 2021 reviewed ?-VQ scan on 08/30/2021 suggestive of left-sided PE ?-Risk versus benefit of full  anticoagulation discussed with patient and patient's daughter-- Ms Susa Raring, given symptomatic anemia requiring transfusions over the last couple of months including transfusion couple weeks ago-- ?-Patient also has history of prior retroperitoneal hemorrhage ?--- Patient's daughter would like to hold off on anticoagulation due to concerns about very very high risk of life-threatening bleeding ?-Hgb down to 6.5 from 9.3 overnight ?---Patient will be transition to hospice service and return to [facility] assisted living with full comfort care ?  ?3)Social/Ethics-- Discussed with daughter Ms Susa Raring, also discussed with [facility] palliative care/hospice team--- ?-Patient is a DNR/DNI ?--Patient will be transition to hospice service and return to [facility] assisted living with full comfort care ?  ?4)Dementia with behavioral disturbance--- comfort care ? ?5)Chronic hypotension--- comfort care ? ?6) chronic A-fib/dCHF/HFpEF/severe pulmonary hypertension /asthma/CAD-----comfort care ? ?7) possible UTI--- --UA suggestive of UTI ?Treated with IV Rocephin ?-Transition to comfort care ?  ?8) acute on chronic chronic anemia--- required transfusion multiple times over the last couple months last transfusion was a couple weeks ago ?-Hgb down to 6.5 from 9.3  ?  ?Disposition/----Patient will be transition to hospice service and return to [facility] assisted living with full comfort care ? ?Discharge Condition: --- Overall prognosis is grave ? ?Diet and Activity recommendation:  As advised ? ?Discharge Instructions   ? ? ? Discharge Medications  ? ?  ?Allergies as of 08/31/2021   ? ?   Reactions  ? Ivp Dye [iodinated Contrast Media] Shortness Of Breath  ? ??  ? Omnipaque [iohexol] Shortness Of Breath, Other (See Comments)  ? Short of breath with chest tightness after IV injection in CT, pt was fine when she left department but developed symptoms in parking lot and went to emergency department.  ? Sulfa Antibiotics Shortness Of  Breath  ? Amiodarone   ? Carbamazepine   ? REACTION: toxemia  ? Dilaudid [hydromorphone] Nausea And Vomiting  ? Tramadol Other (See Comments)  ? confusion  ? ?  ? ?  ?Medication List  ?  ? ?STOP taking these medications   ? ?albuterol (2.5 MG/3ML) 0.083% nebulizer solution ?Commonly known as: PROVENTIL ?Replaced by: albuterol 108 (90 Base) MCG/ACT inhaler ?  ?ALPRAZolam 0.25 MG tablet ?Commonly known as: Duanne Moron ?  ?D-5000 125 MCG (5000 UT) Tabs ?Generic drug: Cholecalciferol ?  ?digoxin 0.125 MG tablet ?Commonly known as: LANOXIN ?  ?docusate sodium 100 MG capsule ?Commonly known as: COLACE ?  ?famotidine 20 MG tablet ?Commonly known as: PEPCID ?  ?ferrous sulfate 325 (65 FE) MG tablet ?  ?fluticasone 50 MCG/ACT nasal spray ?Commonly known as: FLONASE ?  ?furosemide 20 MG tablet ?Commonly known as: LASIX ?  ?ICaps MV Tabs ?  ?levETIRAcetam 250 MG tablet ?Commonly known as: KEPPRA ?  ?levothyroxine 25 MCG tablet ?Commonly known as: SYNTHROID ?  ?melatonin 5 MG Tabs ?  ?  midodrine 2.5 MG tablet ?Commonly known as: PROAMATINE ?  ?montelukast 10 MG tablet ?Commonly known as: SINGULAIR ?  ?pantoprazole 40 MG tablet ?Commonly known as: PROTONIX ?  ?potassium chloride 10 MEQ tablet ?Commonly known as: KLOR-CON ?  ?sertraline 25 MG tablet ?Commonly known as: ZOLOFT ?  ?Trelegy Ellipta 100-62.5-25 MCG/ACT Aepb ?Generic drug: Fluticasone-Umeclidin-Vilant ?  ? ?  ? ?TAKE these medications   ? ?acetaminophen 650 MG suppository ?Commonly known as: TYLENOL ?Place 1 suppository (650 mg total) rectally every 6 (six) hours as needed for mild pain (or Fever >/= 101). ?  ?albuterol 108 (90 Base) MCG/ACT inhaler ?Commonly known as: VENTOLIN HFA ?Inhale 2 puffs into the lungs every 2 (two) hours as needed for wheezing or shortness of breath. ?Replaces: albuterol (2.5 MG/3ML) 0.083% nebulizer solution ?  ?chlorpheniramine-HYDROcodone 10-8 MG/5ML ?Take 5 mLs by mouth every 12 (twelve) hours as needed for cough. ?   ?guaiFENesin-dextromethorphan 100-10 MG/5ML syrup ?Commonly known as: ROBITUSSIN DM ?Take 10 mLs by mouth every 4 (four) hours as needed for cough. ?  ?LORazepam 2 MG/ML concentrated solution ?Commonly known as: LORazepam Intensol ?Take 0.5 mLs (

## 2021-09-01 MED ORDER — LORAZEPAM 2 MG/ML IJ SOLN: 0.5000 mg | Freq: Once | INTRAMUSCULAR | Status: DC

## 2021-09-01 NOTE — TOC Progression Note (Signed)
Spoke with Deborah [name] at [facility] who states that the O2 has been delivered and they have pt's medications. They are ready for pt to return. Updated RN, [facility], and pt's daughter. EMS arranged. No other TOC needs. ?

## 2021-09-04 LAB — CULTURE, BLOOD (ROUTINE X 2)
Culture: NO GROWTH
Culture: NO GROWTH
Special Requests: ADEQUATE

## 2021-09-14 ENCOUNTER — Ambulatory Visit (INDEPENDENT_AMBULATORY_CARE_PROVIDER_SITE_OTHER): Payer: Medicare Other | Admitting: Gastroenterology

## 2021-09-16 ENCOUNTER — Ambulatory Visit (INDEPENDENT_AMBULATORY_CARE_PROVIDER_SITE_OTHER): Payer: Medicare Other | Admitting: Gastroenterology

## 2021-09-20 DEATH — deceased

## 2021-09-21 ENCOUNTER — Encounter (HOSPITAL_COMMUNITY): Payer: Medicare Other | Admitting: Hematology

## 2021-10-04 ENCOUNTER — Ambulatory Visit (INDEPENDENT_AMBULATORY_CARE_PROVIDER_SITE_OTHER): Payer: Medicare Other | Admitting: Gastroenterology

## 2022-07-21 ENCOUNTER — Encounter: Payer: Self-pay | Admitting: Radiology
